# Patient Record
Sex: Female | Born: 1940 | Race: White | Hispanic: No | Smoking: Never smoker
Health system: Southern US, Community
[De-identification: ages and names within clinical notes are randomized; demographics above are authoritative.]

## PROBLEM LIST (undated history)

## (undated) DIAGNOSIS — M069 Rheumatoid arthritis, unspecified: Secondary | ICD-10-CM

## (undated) DIAGNOSIS — M8580 Other specified disorders of bone density and structure, unspecified site: Secondary | ICD-10-CM

## (undated) DIAGNOSIS — I272 Pulmonary hypertension, unspecified: Secondary | ICD-10-CM

## (undated) DIAGNOSIS — K219 Gastro-esophageal reflux disease without esophagitis: Secondary | ICD-10-CM

## (undated) DIAGNOSIS — C4490 Unspecified malignant neoplasm of skin, unspecified: Secondary | ICD-10-CM

## (undated) DIAGNOSIS — H6691 Otitis media, unspecified, right ear: Secondary | ICD-10-CM

## (undated) DIAGNOSIS — R9431 Abnormal electrocardiogram [ECG] [EKG]: Secondary | ICD-10-CM

## (undated) DIAGNOSIS — I10 Essential (primary) hypertension: Secondary | ICD-10-CM

## (undated) DIAGNOSIS — R040 Epistaxis: Secondary | ICD-10-CM

## (undated) DIAGNOSIS — J309 Allergic rhinitis, unspecified: Secondary | ICD-10-CM

## (undated) DIAGNOSIS — J189 Pneumonia, unspecified organism: Secondary | ICD-10-CM

## (undated) DIAGNOSIS — R011 Cardiac murmur, unspecified: Secondary | ICD-10-CM

## (undated) DIAGNOSIS — I482 Chronic atrial fibrillation, unspecified: Secondary | ICD-10-CM

## (undated) DIAGNOSIS — L719 Rosacea, unspecified: Secondary | ICD-10-CM

## (undated) DIAGNOSIS — J9690 Respiratory failure, unspecified, unspecified whether with hypoxia or hypercapnia: Secondary | ICD-10-CM

## (undated) DIAGNOSIS — Z9289 Personal history of other medical treatment: Secondary | ICD-10-CM

## (undated) DIAGNOSIS — J42 Unspecified chronic bronchitis: Secondary | ICD-10-CM

## (undated) DIAGNOSIS — E7800 Pure hypercholesterolemia, unspecified: Secondary | ICD-10-CM

## (undated) DIAGNOSIS — L564 Polymorphous light eruption: Secondary | ICD-10-CM

## (undated) DIAGNOSIS — J918 Pleural effusion in other conditions classified elsewhere: Secondary | ICD-10-CM

## (undated) DIAGNOSIS — M7502 Adhesive capsulitis of left shoulder: Secondary | ICD-10-CM

## (undated) DIAGNOSIS — E785 Hyperlipidemia, unspecified: Secondary | ICD-10-CM

## (undated) HISTORY — DX: Hyperlipidemia, unspecified: E78.5

## (undated) HISTORY — DX: Allergic rhinitis, unspecified: J30.9

## (undated) HISTORY — DX: Gastro-esophageal reflux disease without esophagitis: K21.9

## (undated) HISTORY — DX: Pulmonary hypertension, unspecified: I27.20

## (undated) HISTORY — PX: TEMPOROMANDIBULAR JOINT SURGERY: SHX35

## (undated) HISTORY — DX: Pleural effusion in other conditions classified elsewhere: J91.8

## (undated) HISTORY — DX: Essential (primary) hypertension: I10

## (undated) HISTORY — DX: Chronic atrial fibrillation, unspecified: I48.20

## (undated) HISTORY — DX: Adhesive capsulitis of left shoulder: M75.02

## (undated) HISTORY — DX: Pneumonia, unspecified organism: J18.9

## (undated) HISTORY — PX: BREAST BIOPSY: SHX20

## (undated) HISTORY — DX: Polymorphous light eruption: L56.4

## (undated) HISTORY — DX: Pure hypercholesterolemia, unspecified: E78.00

## (undated) HISTORY — DX: Other specified disorders of bone density and structure, unspecified site: M85.80

## (undated) HISTORY — DX: Personal history of other medical treatment: Z92.89

## (undated) HISTORY — PX: TOTAL ABDOMINAL HYSTERECTOMY: SHX209

## (undated) HISTORY — DX: Rosacea, unspecified: L71.9

## (undated) HISTORY — DX: Rheumatoid arthritis, unspecified: M06.9

## (undated) HISTORY — DX: Otitis media, unspecified, right ear: H66.91

---

## 1973-01-16 DIAGNOSIS — M069 Rheumatoid arthritis, unspecified: Secondary | ICD-10-CM

## 1973-01-16 HISTORY — DX: Rheumatoid arthritis, unspecified: M06.9

## 1978-01-16 DIAGNOSIS — M7502 Adhesive capsulitis of left shoulder: Secondary | ICD-10-CM

## 1978-01-16 HISTORY — DX: Adhesive capsulitis of left shoulder: M75.02

## 1984-01-17 DIAGNOSIS — H6691 Otitis media, unspecified, right ear: Secondary | ICD-10-CM

## 1984-01-17 HISTORY — DX: Otitis media, unspecified, right ear: H66.91

## 1999-08-05 ENCOUNTER — Encounter: Admission: RE | Admit: 1999-08-05 | Discharge: 1999-08-05 | Payer: Self-pay | Admitting: Internal Medicine

## 1999-08-05 ENCOUNTER — Encounter: Payer: Self-pay | Admitting: Internal Medicine

## 2000-02-27 ENCOUNTER — Inpatient Hospital Stay (HOSPITAL_COMMUNITY): Admission: EM | Admit: 2000-02-27 | Discharge: 2000-02-29 | Payer: Self-pay | Admitting: Emergency Medicine

## 2000-02-27 ENCOUNTER — Encounter: Payer: Self-pay | Admitting: Rheumatology

## 2000-02-28 ENCOUNTER — Encounter: Payer: Self-pay | Admitting: Internal Medicine

## 2000-02-28 ENCOUNTER — Encounter: Payer: Self-pay | Admitting: Rheumatology

## 2000-03-29 ENCOUNTER — Ambulatory Visit (HOSPITAL_COMMUNITY): Admission: RE | Admit: 2000-03-29 | Discharge: 2000-03-29 | Payer: Self-pay | Admitting: Neurosurgery

## 2000-03-29 ENCOUNTER — Encounter: Payer: Self-pay | Admitting: Neurosurgery

## 2000-04-12 ENCOUNTER — Ambulatory Visit (HOSPITAL_COMMUNITY): Admission: RE | Admit: 2000-04-12 | Discharge: 2000-04-12 | Payer: Self-pay | Admitting: Neurosurgery

## 2000-04-12 ENCOUNTER — Encounter: Payer: Self-pay | Admitting: Neurosurgery

## 2000-05-02 ENCOUNTER — Encounter: Payer: Self-pay | Admitting: Neurosurgery

## 2000-05-02 ENCOUNTER — Encounter: Admission: RE | Admit: 2000-05-02 | Discharge: 2000-05-02 | Payer: Self-pay | Admitting: Neurosurgery

## 2000-10-23 ENCOUNTER — Other Ambulatory Visit: Admission: RE | Admit: 2000-10-23 | Discharge: 2000-10-23 | Payer: Self-pay | Admitting: Dermatology

## 2001-10-28 ENCOUNTER — Other Ambulatory Visit: Admission: RE | Admit: 2001-10-28 | Discharge: 2001-10-28 | Payer: Self-pay | Admitting: Gynecology

## 2002-01-16 DIAGNOSIS — L564 Polymorphous light eruption: Secondary | ICD-10-CM

## 2002-01-16 HISTORY — DX: Polymorphous light eruption: L56.4

## 2002-11-11 ENCOUNTER — Other Ambulatory Visit: Admission: RE | Admit: 2002-11-11 | Discharge: 2002-11-11 | Payer: Self-pay | Admitting: Gynecology

## 2003-11-16 ENCOUNTER — Other Ambulatory Visit: Admission: RE | Admit: 2003-11-16 | Discharge: 2003-11-16 | Payer: Self-pay | Admitting: Gynecology

## 2004-10-31 ENCOUNTER — Other Ambulatory Visit: Admission: RE | Admit: 2004-10-31 | Discharge: 2004-10-31 | Payer: Self-pay | Admitting: Gynecology

## 2005-11-13 ENCOUNTER — Other Ambulatory Visit: Admission: RE | Admit: 2005-11-13 | Discharge: 2005-11-13 | Payer: Self-pay | Admitting: Gynecology

## 2006-11-19 ENCOUNTER — Other Ambulatory Visit: Admission: RE | Admit: 2006-11-19 | Discharge: 2006-11-19 | Payer: Self-pay | Admitting: Gynecology

## 2007-02-04 ENCOUNTER — Encounter: Admission: RE | Admit: 2007-02-04 | Discharge: 2007-02-04 | Payer: Self-pay | Admitting: Geriatric Medicine

## 2010-03-17 HISTORY — PX: CARDIAC CATHETERIZATION: SHX172

## 2010-04-04 ENCOUNTER — Inpatient Hospital Stay (HOSPITAL_COMMUNITY)
Admission: EM | Admit: 2010-04-04 | Discharge: 2010-04-12 | DRG: 287 | Disposition: A | Discharge status: Home / Self Care | Payer: Medicare Other | Source: Ambulatory Visit | Attending: Interventional Cardiology | Admitting: Interventional Cardiology

## 2010-04-04 ENCOUNTER — Emergency Department (HOSPITAL_COMMUNITY): Payer: Medicare Other

## 2010-04-04 DIAGNOSIS — E785 Hyperlipidemia, unspecified: Secondary | ICD-10-CM | POA: Diagnosis present

## 2010-04-04 DIAGNOSIS — G43909 Migraine, unspecified, not intractable, without status migrainosus: Secondary | ICD-10-CM | POA: Diagnosis present

## 2010-04-04 DIAGNOSIS — M069 Rheumatoid arthritis, unspecified: Secondary | ICD-10-CM | POA: Diagnosis present

## 2010-04-04 DIAGNOSIS — Z79899 Other long term (current) drug therapy: Secondary | ICD-10-CM

## 2010-04-04 DIAGNOSIS — IMO0002 Reserved for concepts with insufficient information to code with codable children: Secondary | ICD-10-CM

## 2010-04-04 DIAGNOSIS — I4581 Long QT syndrome: Secondary | ICD-10-CM | POA: Diagnosis present

## 2010-04-04 DIAGNOSIS — K219 Gastro-esophageal reflux disease without esophagitis: Secondary | ICD-10-CM | POA: Diagnosis present

## 2010-04-04 DIAGNOSIS — J90 Pleural effusion, not elsewhere classified: Secondary | ICD-10-CM | POA: Diagnosis present

## 2010-04-04 DIAGNOSIS — I4891 Unspecified atrial fibrillation: Principal | ICD-10-CM | POA: Diagnosis present

## 2010-04-04 LAB — COMPREHENSIVE METABOLIC PANEL
ALT: 32 U/L (ref 0–35)
Alkaline Phosphatase: 69 U/L (ref 39–117)
BUN: 9 mg/dL (ref 6–23)
BUN: 6 mg/dL (ref 6–23)
CO2: 23 mEq/L (ref 19–32)
Calcium: 8.2 mg/dL — ABNORMAL LOW (ref 8.4–10.5)
Creatinine, Ser: 0.74 mg/dL (ref 0.4–1.2)
Creatinine, Ser: 0.64 mg/dL (ref 0.4–1.2)
GFR calc non Af Amer: >60 mL/min (ref >60)
Glucose, Bld: 139 mg/dL — ABNORMAL HIGH (ref 70–99)
Glucose, Bld: 117 mg/dL — ABNORMAL HIGH (ref 70–99)
Potassium: 3.6 mEq/L (ref 3.5–5.1)
Sodium: 136 mEq/L (ref 135–145)
Total Bilirubin: 0.8 mg/dL (ref 0.3–1.2)
Total Protein: 6.8 g/dL (ref 6.0–8.3)

## 2010-04-04 LAB — DIFFERENTIAL
Basophils Absolute: 0 10*3/uL (ref 0.0–0.1)
Eosinophils Absolute: 0.1 10*3/uL (ref 0.0–0.7)
Eosinophils Relative: 1 % (ref 0–5)
Eosinophils Relative: 0 % (ref 0–5)
Lymphocytes Relative: 14 % (ref 12–46)
Lymphocytes Relative: 14 % (ref 12–46)
Lymphs Abs: 1.4 10*3/uL (ref 0.7–4.0)
Monocytes Absolute: 1.6 10*3/uL — ABNORMAL HIGH (ref 0.1–1.0)
Monocytes Relative: 17 % — ABNORMAL HIGH (ref 3–12)
Neutrophils Relative %: 70 % (ref 43–77)

## 2010-04-04 LAB — CBC
HCT: 41.4 % (ref 36.0–46.0)
MCH: 30.3 pg (ref 26.0–34.0)
MCHC: 34.1 g/dL (ref 30.0–36.0)
MCV: 88.8 fL (ref 78.0–100.0)
Platelets: 236 10*3/uL (ref 150–400)
RBC: 5.03 MIL/uL (ref 3.87–5.11)
RDW: 15.1 % (ref 11.5–15.5)
RDW: 16 % — ABNORMAL HIGH (ref 11.5–15.5)
WBC: 10.6 10*3/uL — ABNORMAL HIGH (ref 4.0–10.5)

## 2010-04-04 LAB — POCT CARDIAC MARKERS
CKMB, poc: <1 ng/mL — ABNORMAL LOW (ref 1.0–8.0)
Myoglobin, poc: 68 ng/mL (ref 12–200)
Troponin i, poc: <0.05 ng/mL (ref 0.00–0.09)

## 2010-04-04 LAB — CARDIAC PANEL(CRET KIN+CKTOT+MB+TROPI)

## 2010-04-04 LAB — LIPASE, BLOOD: Lipase: 21 U/L (ref 11–59)

## 2010-04-04 LAB — CK TOTAL AND CKMB (NOT AT ARMC): CK, MB: 1.1 ng/mL (ref 0.3–4.0)

## 2010-04-05 LAB — LIPID PANEL
HDL: 38 mg/dL — ABNORMAL LOW (ref >39)
Total CHOL/HDL Ratio: 3.1 RATIO
Triglycerides: 66 mg/dL (ref <150)
VLDL: 13 mg/dL (ref 0–40)

## 2010-04-05 LAB — URINALYSIS, ROUTINE W REFLEX MICROSCOPIC
Bilirubin Urine: NEGATIVE
Protein, ur: NEGATIVE mg/dL
Urobilinogen, UA: 0.2 mg/dL (ref 0.0–1.0)

## 2010-04-05 LAB — BASIC METABOLIC PANEL
CO2: 24 mEq/L (ref 19–32)
Chloride: 105 mEq/L (ref 96–112)
Sodium: 135 mEq/L (ref 135–145)

## 2010-04-05 LAB — HEPARIN LEVEL (UNFRACTIONATED): Heparin Unfractionated: 0.29 IU/mL — ABNORMAL LOW (ref 0.30–0.70)

## 2010-04-05 LAB — CBC
Hemoglobin: 12 g/dL (ref 12.0–15.0)
MCH: 30.5 pg (ref 26.0–34.0)
RBC: 3.94 MIL/uL (ref 3.87–5.11)

## 2010-04-05 LAB — URINE MICROSCOPIC-ADD ON

## 2010-04-05 LAB — CARDIAC PANEL(CRET KIN+CKTOT+MB+TROPI)

## 2010-04-05 NOTE — H&P (Signed)
[name], [name]             ACCOUNT NO.:  [id number]  MEDICAL RECORD NO.:  [id number]           PATIENT TYPE:  I  LOCATION:  2807                         FACILITY:  [facility]  PHYSICIAN:  Armanda Magic, M.D.     DATE OF BIRTH:  1940-05-21  DATE OF ADMISSION:  04/04/2010 DATE OF DISCHARGE:                             HISTORY & PHYSICAL   REFERRING PHYSICIAN:  Nelva Nay, MD, in the emergency room.  CHIEF COMPLAINTS:  Back pain and neck pain.  HISTORY OF PRESENT ILLNESS:  This is a 70 year old white female with a history of rheumatoid arthritis, long QT syndrome, and GERD, who has no other cardiac history, who presented to the emergency room.  She complains of back pain between her shoulder blades and pain in her throat.  Apparently, she has had a stomach flu over the past several days.  She had nausea, vomiting, and diarrhea on Thursday which stopped on Friday.  Saturday, she developed back pain between her shoulderblades radiating to her neck and shortness of breath.  She refuses at times to take medical advice.  Her symptoms persisted over the weekend. She was awakened last evening with severe pain between her shoulder blades and throat pain and presented to the emergency room with rapid AFib..  There was no ST elevation noted.  While in rapid atrial fibrillation once she was slowed down with IV Cardizem, it was noted that EKG showed 1-mm ST elevation in I and aVL.  She continues to complain of back and throat pain with shortness of breath.  PAST MEDICAL HISTORY:  GERD, rheumatoid arthritis, dyslipidemia, adhesive capsulitis, migraine headaches, allergic rhinitis, acne rosacea, bilateral otitis, history of UTI, and prolonged QTC.  PAST SURGICAL HISTORY:  Total abdominal hysterectomy with BSO, multiple benign moles removed from her left breast, and breast biopsy.  FAMILY HISTORY:  Her father died at 66 of a CVA and CHF.  Her mother died at 69 of CHF.  She has a sister who  had a mitral valve repair.  She has a sister with AFib and lung CA.  She has a sister who is alive and well.  SOCIAL HISTORY:  She is retired.  She denies any alcohol or IV drug use. She is married.  She denies any tobacco use.  REVIEW OF SYSTEMS:  Otherwise as stated in the HPI is negative.  MEDICATIONS: 1. Vitamin D 2000 units daily. 2. Calcium plus D 1000 mg daily. 3. Multivitamin. 4. Toprol-XL 100 mg daily. 5. Enbrel 50 mg/mL 0.5 mL subcu every week. 6. Prednisone 5 mg 1/2 tablet every other day. 7. Methotrexate 2.5 mg 2 tablets on Tuesday and 2 tablets on Wednesday     weekly. 8. Vivelle-Dot 0.075 mg/24 hour patch biweekly. 9. Omeprazole 20 mg daily. 10.Pravastatin 20 mg daily. 11.Folic acid 1 mg daily.  PHYSICAL EXAMINATION:  VITAL SIGNS:  Blood pressure is 104/56.  Heart rate is 87. GENERAL:  This is a well-developed and well-nourished female, in mild distress secondary to back and throat pain. HEENT:  Benign. NECK:  Supple without lymphadenopathy.  Carotid upstrokes are +2 bilaterally.  No bruits. LUNGS:  Clear to  auscultation throughout. HEART:  Irregularly irregular.  No murmurs, rubs, or gallops.  Normal S1 and S2. ABDOMEN:  Soft, nontender, and nondistended.  Normoactive bowel sounds. No hepatosplenomegaly. EXTREMITIES:  No cyanosis, erythema, or edema.  LABORATORY DATA:  CPK 29, MB 1.1, and troponin 0.01.  White cell count 10.6, hemoglobin 15, hematocrit 44, and platelet count 236.  Sodium 136, potassium 3.6, chloride 105, bicarb 23, BUN 9, creatinine 0.74, and glucose 139.  ALT 36, AST 34, and lipase 21.  Chest x-ray shows no active disease.  EKG shows AFib with RVR with ST elevation 1 mm in aVL and lead I.  ASSESSMENT: 1. Neck and back pain with ST-segment elevation in I and aVL with     shortness of breath and normal cardiac enzymes x1. 2. Prolonged QTc. 3. New-onset atrial fibrillation with rapid ventricular response     apparently starting last  evening. 4. Rheumatoid arthritis, on steroids. 5. Gastroesophageal reflux disease.  PLAN:  Emergent cath per Dr. Eldridge Dace.  We will start IV heparin drip per Pharmacy.  Lopressor as blood pressure tolerates.  Further workup per Dr. Eldridge Dace.     Armanda Magic, M.D.     TT/MEDQ  D:  04/04/2010  T:  04/04/2010  Job:  [id number]  cc:   Thora Lance, M.D.  Electronically Signed by Armanda Magic M.D. on 04/05/2010 11:32:38 AM

## 2010-04-06 ENCOUNTER — Inpatient Hospital Stay (HOSPITAL_COMMUNITY): Payer: Medicare Other

## 2010-04-06 LAB — C-REACTIVE PROTEIN: CRP: 28.7 mg/dL — ABNORMAL HIGH (ref <0.6)

## 2010-04-06 LAB — HEPARIN LEVEL (UNFRACTIONATED): Heparin Unfractionated: 0.23 IU/mL — ABNORMAL LOW (ref 0.30–0.70)

## 2010-04-06 LAB — COMPREHENSIVE METABOLIC PANEL
Albumin: 2.7 g/dL — ABNORMAL LOW (ref 3.5–5.2)
BUN: 6 mg/dL (ref 6–23)
Calcium: 8.2 mg/dL — ABNORMAL LOW (ref 8.4–10.5)
Glucose, Bld: 107 mg/dL — ABNORMAL HIGH (ref 70–99)
Total Protein: 5.7 g/dL — ABNORMAL LOW (ref 6.0–8.3)

## 2010-04-06 LAB — LACTIC ACID, PLASMA: Lactic Acid, Venous: 1.2 mmol/L (ref 0.5–2.2)

## 2010-04-06 LAB — CBC
HCT: 32.8 % — ABNORMAL LOW (ref 36.0–46.0)
MCHC: 32.9 g/dL (ref 30.0–36.0)
MCV: 89.9 fL (ref 78.0–100.0)
Platelets: 180 10*3/uL (ref 150–400)
RDW: 15.3 % (ref 11.5–15.5)

## 2010-04-06 LAB — SEDIMENTATION RATE: Sed Rate: 63 mm/hr — ABNORMAL HIGH (ref 0–22)

## 2010-04-06 LAB — RHEUMATOID FACTOR: Rhuematoid fact SerPl-aCnc: 172 IU/mL — ABNORMAL HIGH (ref <=14)

## 2010-04-06 LAB — LACTATE DEHYDROGENASE: LDH: 189 U/L (ref 94–250)

## 2010-04-06 LAB — PROCALCITONIN: Procalcitonin: 0.5 ng/mL

## 2010-04-06 LAB — LIPASE, BLOOD: Lipase: 23 U/L (ref 11–59)

## 2010-04-06 MED ORDER — GADOBENATE DIMEGLUMINE 529 MG/ML IV SOLN
15.0000 mL | Freq: Once | INTRAVENOUS | Status: AC
  Administered 2010-04-06: 15 mL via INTRAVENOUS

## 2010-04-06 MED ORDER — IOHEXOL 350 MG/ML SOLN
100.0000 mL | Freq: Once | INTRAVENOUS | Status: AC | PRN
  Administered 2010-04-06: 100 mL via INTRAVENOUS

## 2010-04-07 LAB — CBC
HCT: 33.1 % — ABNORMAL LOW (ref 36.0–46.0)
MCV: 88.3 fL (ref 78.0–100.0)
RBC: 3.75 MIL/uL — ABNORMAL LOW (ref 3.87–5.11)
RDW: 15.2 % (ref 11.5–15.5)
WBC: 6.6 10*3/uL (ref 4.0–10.5)

## 2010-04-07 LAB — DIFFERENTIAL
Basophils Relative: 0 % (ref 0–1)
Eosinophils Relative: 2 % (ref 0–5)
Monocytes Absolute: 0.9 10*3/uL (ref 0.1–1.0)
Neutrophils Relative %: 60 % (ref 43–77)

## 2010-04-07 LAB — BASIC METABOLIC PANEL
BUN: 7 mg/dL (ref 6–23)
CO2: 28 mEq/L (ref 19–32)
Chloride: 105 mEq/L (ref 96–112)
Glucose, Bld: 99 mg/dL (ref 70–99)
Potassium: 3.5 mEq/L (ref 3.5–5.1)

## 2010-04-07 LAB — URINALYSIS, ROUTINE W REFLEX MICROSCOPIC
Bilirubin Urine: NEGATIVE
Glucose, UA: NEGATIVE mg/dL
Ketones, ur: NEGATIVE mg/dL
pH: 7 (ref 5.0–8.0)

## 2010-04-07 LAB — URINE MICROSCOPIC-ADD ON

## 2010-04-08 LAB — CBC
Hemoglobin: 12.7 g/dL (ref 12.0–15.0)
MCHC: 32.9 g/dL (ref 30.0–36.0)
Platelets: 321 10*3/uL (ref 150–400)
RBC: 4.27 MIL/uL (ref 3.87–5.11)

## 2010-04-08 LAB — URINE CULTURE: Culture  Setup Time: 201203201134

## 2010-04-08 LAB — BASIC METABOLIC PANEL
Chloride: 107 mEq/L (ref 96–112)
Creatinine, Ser: 0.68 mg/dL (ref 0.4–1.2)
GFR calc Af Amer: >60 mL/min (ref >60)
GFR calc non Af Amer: >60 mL/min (ref >60)
Potassium: 4.3 mEq/L (ref 3.5–5.1)

## 2010-04-08 LAB — LEGIONELLA ANTIGEN, URINE

## 2010-04-08 LAB — HEPARIN LEVEL (UNFRACTIONATED): Heparin Unfractionated: 0.61 IU/mL (ref 0.30–0.70)

## 2010-04-08 LAB — HEPATITIS PANEL, ACUTE: Hep A IgM: NEGATIVE

## 2010-04-08 LAB — CARDIOLIPIN ANTIBODIES, IGG, IGM, IGA: Anticardiolipin IgM: 7 MPL U/mL — ABNORMAL LOW (ref <11)

## 2010-04-08 LAB — BRAIN NATRIURETIC PEPTIDE: Pro B Natriuretic peptide (BNP): 341 pg/mL — ABNORMAL HIGH (ref 0.0–100.0)

## 2010-04-09 LAB — CBC
MCHC: 33.4 g/dL (ref 30.0–36.0)
Platelets: 357 10*3/uL (ref 150–400)
RDW: 15.5 % (ref 11.5–15.5)
WBC: 7.9 10*3/uL (ref 4.0–10.5)

## 2010-04-09 LAB — URINE CULTURE
Colony Count: 25000
Culture  Setup Time: 201203222255

## 2010-04-09 LAB — PROTIME-INR
INR: 1.15 (ref 0.00–1.49)
Prothrombin Time: 14.9 seconds (ref 11.6–15.2)

## 2010-04-09 LAB — HEPARIN LEVEL (UNFRACTIONATED): Heparin Unfractionated: 0.63 IU/mL (ref 0.30–0.70)

## 2010-04-10 LAB — BASIC METABOLIC PANEL
BUN: 8 mg/dL (ref 6–23)
Calcium: 8.7 mg/dL (ref 8.4–10.5)
GFR calc non Af Amer: >60 mL/min (ref >60)
Glucose, Bld: 97 mg/dL (ref 70–99)
Potassium: 3.9 mEq/L (ref 3.5–5.1)
Sodium: 138 mEq/L (ref 135–145)

## 2010-04-10 LAB — CBC
HCT: 36 % (ref 36.0–46.0)
MCHC: 33.1 g/dL (ref 30.0–36.0)
Platelets: 351 10*3/uL (ref 150–400)
RDW: 15.4 % (ref 11.5–15.5)
WBC: 10.5 10*3/uL (ref 4.0–10.5)

## 2010-04-10 LAB — PROTIME-INR: INR: 1.44 (ref 0.00–1.49)

## 2010-04-10 LAB — HEPARIN LEVEL (UNFRACTIONATED): Heparin Unfractionated: 0.6 IU/mL (ref 0.30–0.70)

## 2010-04-11 LAB — CULTURE, BLOOD (ROUTINE X 2): Culture  Setup Time: 201203201617

## 2010-04-11 LAB — UIFE/LIGHT CHAINS/TP QN, 24-HR UR
Alpha 1, Urine: DETECTED — AB
Free Kappa Lt Chains,Ur: 2.84 mg/dL — ABNORMAL HIGH (ref 0.04–1.51)
Free Lambda Lt Chains,Ur: 0.33 mg/dL (ref 0.08–1.01)
Gamma Globulin, Urine: DETECTED — AB

## 2010-04-11 LAB — PROTEIN ELECTROPH W RFLX QUANT IMMUNOGLOBULINS
Albumin ELP: 48.4 % — ABNORMAL LOW (ref 55.8–66.1)
Alpha-2-Globulin: 14.8 % — ABNORMAL HIGH (ref 7.1–11.8)
Beta Globulin: 5.4 % (ref 4.7–7.2)
Total Protein ELP: 5.9 g/dL — ABNORMAL LOW (ref 6.0–8.3)

## 2010-04-11 LAB — CBC
HCT: 38.1 % (ref 36.0–46.0)
MCHC: 32.5 g/dL (ref 30.0–36.0)
MCV: 89.9 fL (ref 78.0–100.0)
Platelets: 378 10*3/uL (ref 150–400)
RDW: 15.5 % (ref 11.5–15.5)
WBC: 9.6 10*3/uL (ref 4.0–10.5)

## 2010-04-12 LAB — CBC
HCT: 38.7 % (ref 36.0–46.0)
MCV: 89.4 fL (ref 78.0–100.0)
Platelets: 359 10*3/uL (ref 150–400)
RBC: 4.33 MIL/uL (ref 3.87–5.11)
WBC: 9.1 10*3/uL (ref 4.0–10.5)

## 2010-04-12 LAB — PROTIME-INR: INR: 2.09 — ABNORMAL HIGH (ref 0.00–1.49)

## 2010-04-17 HISTORY — PX: CARDIOVERSION: SHX1299

## 2010-04-20 NOTE — Discharge Summary (Signed)
  [name], [name]             ACCOUNT NO.:  [id number]  MEDICAL RECORD NO.:  [id number]           PATIENT TYPE:  I  LOCATION:  3709                         FACILITY:  [facility]  PHYSICIAN:  Corky Crafts, MDDATE OF BIRTH:  Oct 21, 1940  DATE OF ADMISSION:  04/04/2010 DATE OF DISCHARGE:  04/12/2010                              DISCHARGE SUMMARY   FINAL DIAGNOSES: 1. Pleural effusion with shortness of breath possibly secondary to     rheumatoid arthritis. 2. Atrial fibrillation. 3. Abnormal ECG causing workup including cardiac catheterization,     which was normal.  PROCEDURES: 1. Cardiac catheterization on April 04, 2010. 2. Echocardiogram on April 08, 2010.  HOSPITAL COURSE:  The patient was admitted due to significant back pain and shortness of breath.  Initial ECG showed question of lateral wall ST elevation.  She was taken emergently to the Cath Lab.  She underwent cardiac catheterization, which showed no significant coronary artery disease.  There was normal left ventricular function.  There was no abdominal aortic aneurysm.  The patient was found to be in atrial fibrillation with rapid ventricular response.  She was rate controlled initially with Cardizem and metoprolol.  Digoxin was added.  There was some difficulty in rate controlling her because of borderline blood pressure.  Eventually, she did tolerate oral Cardizem, metoprolol and low-dose digoxin.  The main issue during her hospital stay was her shortness of breath. She was found to have bilateral pleural effusions.  Her shortness of breath and the effusion seem to respond to prednisone.  She also had a low-grade fever, which was thought to be related to her rheumatoid arthritis.  No definitive infection was found.  She also had back and neck pain causing a MRI to be done with no source of infection was seen there.  There were some arthritic changes noted.  She was started on Coumadin for anticoagulation,  not have any bleeding problems. Echocardiogram showed no significant pericardial effusions.  DISCHARGE MEDICATIONS: 1. Tylenol p.r.n. 2. Aspirin 81 mg daily. 3. Digoxin 0.125 mg daily. 4. Diltiazem 240 mg daily. 5. Prednisone 10 mg daily. 6. Warfarin 2.5 mg daily. 7. Calcium. 8. Enbrel. 9. Folic acid. 10.Methotrexate 2.5 mg Tuesday and Wednesday. 11.Multivitamin. 12.Omeprazole 20 mg daily. 13.Pravastatin 20 mg daily. 14.Stool softener. 15.Toprol-XL 100 mg daily. 16.Vitamin B3. 17.Vivelle-Dot 0.75 one patch Sundays and Wednesdays.  LABS AT DISCHARGE:  INR 2.09.  DIET:  Heart-healthy and low-salt diet.  Followup appointments with Dr. Valentina Lucks in 1 week, follow up with Dr. Hoyle Barr office for an INR checked on April 14, 2010, and also follow up with Dr. Ardelle Park as scheduled.  ACTIVITY:  Increase activity slowly.     Corky Crafts, MD     JSV/MEDQ  D:  04/12/2010  T:  04/12/2010  Job:  [id number]  Electronically Signed by Lance Muss MD on 04/20/2010 08:21:51 AM

## 2010-04-20 NOTE — Procedures (Signed)
  [name], [name]             ACCOUNT NO.:  [id number]  MEDICAL RECORD NO.:  [id number]           PATIENT TYPE:  I  LOCATION:  2923                         FACILITY:  [facility]  PHYSICIAN:  Corky Crafts, MDDATE OF BIRTH:  05-06-1940  DATE OF PROCEDURE:  04/04/2010 DATE OF DISCHARGE:                           CARDIAC CATHETERIZATION   PRIMARY CARE PHYSICIAN:  Thora Lance, MD  PROCEDURE PERFORMED:  Left heart catheterization, left ventriculogram, coronary angiogram, abdominal aortogram.  OPERATOR:  Corky Crafts, MD  INDICATIONS:  Abnormal ECG suspected lateral wall ST-elevation MI.  PROCEDURE:  The patient was brought emergently to the cath lab.  She was prepped and draped in the usual sterile fashion.  Her right groin was infiltrated with 1% lidocaine.  A 6-French sheath was placed in the right common femoral artery using a modified Seldinger technique.  Left coronary artery angiography was performed using a JL-4.0 catheter. Catheter was advanced to the vessel ostium under fluoroscopic guidance. Digital angiography was performed in multiple projections using hand injection of contrast.  Right coronary artery angiography was then performed with a JR-4 catheter in similar fashion.  A pigtail catheter was advanced to the ascending aorta and across the aortic valve under fluoroscopic guidance.  Power injection of contrast was performed in the RAO projection to image the left ventricle.  The catheter was pulled back under continuous hemodynamic pressure monitoring.  The catheter was then withdrawn to the abdominal aorta and a power injection of contrast was performed to image the abdominal aorta.  The sheath will be removed using manual compression.  FINDINGS: 1. The left main is widely patent. 2. Left circumflex is a large vessel.  There is a large OM-1 which is     tortuous and an OM-2 which is medium sized.  All of these vessels     are angiographically  normal. 3. Left anterior descending is a large vessel which wraps around the     apex.  There are two small diagonals, all of which are     angiographically normal. 4. The right coronary artery is medium-sized vessel.  There are mild     irregularities in the mid vessel.  The posterolateral artery and     the PDA are medium-sized vessels and appear widely patent. 5. Left ventriculogram shows normal left ventricular function.  The     ascending aorta appears normal.  HEMODYNAMIC RESULTS:  Ventricle pressure 96/4 with an LVEDP of 10 mmHg. Aortic pressure 100/63 with a mean aortic pressure of 79 mmHg. Abdominal aortogram showed no abdominal aortic aneurysm.  There are only mild irregularities noted in the aorta.  IMPRESSION: 1. No significant coronary artery disease. 2. Normal ventricular function. 3. No abdominal aortic aneurysm. 4. Normal hemodynamics.  RECOMMENDATIONS:  Continue aspirin.  She will need management for atrial fibrillation.  She will likely need to be started on heparin several hours after her sheath pull.     Corky Crafts, MD     JSV/MEDQ  D:  04/04/2010  T:  04/05/2010  Job:  [id number]  Electronically Signed by Lance Muss MD on 04/20/2010 08:21:54 AM

## 2010-05-04 ENCOUNTER — Ambulatory Visit (HOSPITAL_COMMUNITY)
Admission: RE | Admit: 2010-05-04 | Discharge: 2010-05-04 | Disposition: A | Discharge status: Home / Self Care | Payer: Medicare Other | Source: Ambulatory Visit | Attending: Interventional Cardiology | Admitting: Interventional Cardiology

## 2010-05-04 DIAGNOSIS — I4891 Unspecified atrial fibrillation: Secondary | ICD-10-CM | POA: Insufficient documentation

## 2010-05-04 DIAGNOSIS — Z7901 Long term (current) use of anticoagulants: Secondary | ICD-10-CM | POA: Insufficient documentation

## 2010-05-04 LAB — PROTIME-INR: Prothrombin Time: 22.9 seconds — ABNORMAL HIGH (ref 11.6–15.2)

## 2010-05-13 NOTE — Consult Note (Signed)
[name], [name]             ACCOUNT NO.:  [id number]  MEDICAL RECORD NO.:  [id number]           PATIENT TYPE:  I  LOCATION:  2923                         FACILITY:  [facility]  PHYSICIAN:  Pincus Large, MD     DATE OF BIRTH:  [birth year]  DATE OF CONSULTATION:  04/05/2010 DATE OF DISCHARGE:                                CONSULTATION   REQUESTING PHYSICIAN:  Gretta Arab. Valentina Lucks, MD  REASON FOR CONSULTATION:  Fever.  BRIEF HOSPITAL COURSE:  She is a 70 year old white female with history of rheumatoid arthritis, history of long QT syndrome, and history of GERD who has no other cardiac history presented to the emergency room on the day of admission with neck pain between her shoulder blades and pain in her throat.  Apparently, the patient was having vomiting over the past several days then she had nausea, vomiting, and diarrhea which resolved on Friday.  Saturday, she woke up with back pain between her shoulder blades radiating to her neck.  The patient came to the emergency room where we found that she has a new onset AFib.  When we gave IV Cardizem, it was noted that the patient has ST elevation on II and aVL.  The patient was taken to cardiac cath which was normal. Medical consult was called for fever of unknown etiology.  I saw the patient this afternoon.  The patient was having severe back pain in her thoracic spine with shortness of breath.  She denies any cough.  She denies any nausea.  She denies any vomiting or diarrhea.  She denies any dysuria.  IMAGING:  Her chest x-ray was done on April 04, 2010 which was normal.  LABORATORY DATA:  WBC on admission was 10.6 which dropped to 8.1 on April 05, 2010.  Her sodium is 136, potassium is 3.4, chloride is 105, bicarb is 24, glucose 119, BUN is 6, and creatinine is 0.6.  Her urine shows LE with moderate, wbc's 3-6, and bacteria was rare.  Culture was pending.  PAST MEDICAL HISTORY: 1. GERD. 2. Rheumatoid arthritis. 3.  Dyslipidemia. 4. Migraine headaches. 5. Allergic rhinitis. 6. History of UTI. 7. History of prolonged QT interval.  PAST SURGICAL HISTORY:  Total abdominal hysterectomy and multiple benign moles removed from her left breast.  FAMILY HISTORY:  Father died at 18 from CVA and CHF.  Mother died at 75 of CHF.  SOCIAL HISTORY:  She is retired.  She denies any alcohol or IV drug abuse.  ALLERGIES:  The patient is allergic to: 1. PENICILLIN. 2. NAPROXEN. 3. NITROFURANTOIN. 4. ERYTHROMYCIN.  REVIEW OF SYSTEMS:  All systems were reviewed including HEENT, GI, GU, CVS, CNS, allergy/immunology, endocrine, and musculoskeletal and all were negative except for those mentioned in H and P.  PHYSICAL EXAMINATION:  GENERAL:  The patient is awake, oriented x3 with mild distress sitting on a chair. VITAL SIGNS:  Temperature is 100.1, pulse is 84, respirations 18, and blood pressure is 112/51. CARDIAC:  S1-S2 are regular.  No murmur, no gallop, and no rub. LUNGS:  Bilateral air entry.  No wheezes or crackles. ABDOMEN:  Soft, positive bowel sounds present.  GU:  Deferred. BACK:  There is mild tenderness in the thoracic spine, tender to touch. NEURO:  She is awake and oriented x3.  Motor 5/5 in all 4 extremities. Cranial nerves II-XII were intact.  ASSESSMENT AND PLAN: 1. Neck and back pain.  We would like to get an MRI of the neck and     back with fever in the morning about this crisis, so I am going to     send the patient for MRI for now. 2. New onset atrial fibrillation with rapid ventricular response.  The     patient is rate controlled now.  She is on heparin drip with rate     control.  Also, we will try rule out pulmonary embolism at this     time. 3. Fever.  Agree with the pan culture.  I will treat empirically for     urinary tract infection at this point with Cipro. 4. Rheumatoid arthritis.  She is on steroids. 5. Gastroesophageal reflux disease, on PPI.           ______________________________ Pincus Large, MD     SA/MEDQ  D:  04/05/2010  T:  04/06/2010  Job:  [id number]  Electronically Signed by Virginia Rochester M.D. on 05/13/2010 04:16:33 PM

## 2010-06-03 NOTE — Discharge Summary (Signed)
[facility]  Patient:    [name], [name]                      MRN: [id number] Adm. Date:  [id number] Disc. Date: 02/29/00 Attending:  Georgann Housekeeper CC:         Demetria Pore. Coral Spikes, M.D.   Discharge Summary  DISCHARGE DIAGNOSES: 1. Right leg pain. 2. 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 3. Possible sciatica.  DISCHARGE MEDICATIONS: 1. Prednisone taper 60 mg down 10 mg each day until she goes back to her 5 gm. 2. Toprol XL 50 mg one tablet q.d. 3. Calcium. 4. Multivitamin. 5. Celebrex 200 mg b.i.d. 6. Darvocet-N 100 q.6-8h. p.r.n.  DISCHARGE INSTRUCTIONS:  Activities:  As per the physical therapy evaluation. Diet:  No restrictions.  DISCHARGE FOLLOWUP:  Follow up in one week in the office with Dr. Coral Spikes or with me.  CONDITION ON DISCHARGE:  Stable.  HISTORY OF PRESENT ILLNESS:  The patient is a _______-year-old with history of rheumatoid arthritis admitted from the clinic after after a couple of days of acute right groin and right buttock pain going down to her leg with some numbness sensation and paresthesias.  She was unable to stand on her feet and had difficulty walking and ambulation.  On her initial evaluation her lab work was all unremarkable including CBC and chemistries.  She underwent an x-ray of the hips.  Right hip was negative.  LS spine x-ray showed degenerative arthritis in L5-S1 disk.  HOSPITAL COURSE:  The patient was admitted for pain control.  Given some Demerol which made her a little drowsy.  She only had to take one to two doses.  Underwent an MRI scan of the lumbosacral area.  The MRI showed a bulge with mild to moderate bulging disk L5-S1 with mild to moderate stenosis, otherwise unremarkable.  The patient on exam was very tender in the right trochanteric area and the sciatic notch and thought to be sciatic inflammation plus/minus trochanteric bursitis.  Was injected with Aristospan and  with Marcaine in the greater trochanter bursa of the right hip plus the sciatic notch.  Also given a dose of Solu-Medrol 50 mg.  The patient started to improve a little bit, was able to stand up and bear weight and walk a few steps.  She still had some pain when she laid down and tried to get some few steps, but that was better than the admission.  Will have physical therapy evaluate her and give her home assessments to carry out some exercises. Continue on a prednisone taper.  Follow up in one week.  Possibly if not better, might need outpatient physical therapy and/or orthopedic evaluation in the future.  Rheumatoid arthritis was stable. DD:  02/29/00 TD:  02/29/00 Job: [id number]
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
## 2010-06-20 NOTE — Consult Note (Signed)
[name], [name]             ACCOUNT NO.:  [id number]  MEDICAL RECORD NO.:  [id number]           PATIENT TYPE:  I  LOCATION:  3709                         FACILITY:  [facility]  PHYSICIAN:  Zenovia Jordan, MD      DATE OF BIRTH:  08-Jul-1940  DATE OF CONSULTATION:  04/08/2010 DATE OF DISCHARGE:                                CONSULTATION   REASON FOR CONSULTATION:  Pleural effusions, fever, and rheumatoid arthritis.  CHIEF COMPLAINT:  This is a 70 year old female who is the patient of mine in Rheumatology Practice for longstanding erosive rheumatoid arthritis.  In the last year she has been well controlled with minimal joint symptoms on Enbrel weekly, prednisone 2.5 mg every other day which has been decreased over the last year, and methotrexate 5 tablets weekly.  About a week ago she noted sudden onset of viral gastroenteritis with severe nausea, vomiting, and fevers for 2 days. After vomiting for a few day she began having severe upper back and neck pain and came to the emergency room.  She was noted on her arrival to the emergency room to be in atrial fibrillation and there was concern about an MI and she was evaluated in the cath lab which was negative. She has improved in regards to her nausea and vomiting and also her cardiac rate with medications by Cardiology but has had continued shortness of breath and was noted to have mild-to-moderate bilateral pleural effusions without infiltrates on the CT of the chest.  Her CT of abdomen and pelvis was negative.  She also had MRIs of her cervical spine which were negative.  She has been holding her methotrexate and Enbrel this last week due to her illness.  She does have some mild pain in the right hand and wrist joints.  She has had persistent low-grade temperature since her onset of her illness despite improvement of her gastroenteritis symptoms.  She notes that her neck and back pain is better with evidently occasional trapezial  pain with certain movements and deep breaths.  Family notes that the patient is mildly more confused throughout the hospitalization.  PAST MEDICAL HISTORY:  Rheumatoid arthritis diagnosed in 1975 which is erosive and GERD.  SOCIAL HISTORY:  No tobacco, no alcohol.  ALLERGIES:  Known drug allergies.  REVIEW OF SYSTEMS:  Positives include edema and pain of the right hand, shortness of breath, upper back pain without rashes, and diffuse swollen joints.  LABORATORY DATA:  White count 6.6, hemoglobin 10.9, hematocrit 33.1, and platelets 242.  Sodium 136, potassium 3.5, chloride 105, bicarb 28, BUN 7, creatinine 0.6, glucose 99.  ESR 63.  CRP 28.7.  CT of the chest bilateral mild-to-moderate effusion.  Abdomen and pelvis CT was negative.  PHYSICAL EXAMINATION:  VITAL SIGNS:  Pulse 88-102, respiratory rate 19, blood pressure 106/35, and T-max was 100.3. GENERAL:  She has no apparent distress.  She was alert and oriented. She did have some mild shortness of breath on exam and was wearing oxygen. HEENT:  Pupils equal, round, reactive to light.  Extraocular muscles intact.  No oral ulcers. CARDIOVASCULAR:  Regular rate and rhythm, S1 and S2.  LUNGS:  Mild lower slight crackles bilaterally. EXTREMITIES:  Mild edema of the right wrist with decreased range of motion which is not new and some nodular OA changes diffusely. SKIN:  No rashes but had bruising.  ASSESSMENT AND PLAN:  This is a very pleasant 70 year old rheumatoid arthritis patient of mine, who I have been following for the last 2 years.  Her artery has been stable for some time, on methotrexate 5 tablets weekly and Enbrel weekly and over the last year we had been decreasing her prednisone down to 2.5 mg every other day.  It is important to note that the patient has frequent bronchitis episodes and is treated with high-dose steroids through her pulmonologist or allergist and every 2 months hospital holds her methotrexate and  Enbrel for this.  She presented with a recent gastroenteritis which progressed to the chest and back pain and then was noted to have new onset AFib. Her gastroenteritis and neck pain has improved but she continues to have persistent fevers and shortness of breath.  It is very common in patients with longstanding rheumatoid arthritis to have pleural effusions which are usually asymptomatic.  These pleural effusions are often related to fevers and typically respond to 10-20 mg of prednisone daily or NSAIDs.  There is also concern, however, in patients who are on Enbrel and methotrexate for atypical infection.  Ideally it would be helpful to have a thoracentesis for diagnostic, however without infiltrates or unilateral effusion, infection may be somewhat less likely.  I would recommend an ID consult to ensure that we are not missing any atypical infection that she would be a risk for taking Enbrel.  At this time, as the effusions typically respond to low-dose steroids, we will increase her prednisone to 10 mg daily, which certainly should not hide the severe infection and we will continue to hold her methotrexate and Enbrel.  I also have some concerns that she could have some element of adrenal insufficiency with the high-dose steroids that she receives every few months for her recurrent bronchitis, and I too think that she does have an underlying element of mild shortness of breath.  For completeness, I will order a PPD to be placed and hepatitis serologies.  Lymphoma would also be a concern in this patient as she does have a significantly higher risk secondary to her underlying rheumatoid arthritis and we will send an SPEP and UPEP. Fortunately, the CT of her abdomen and pelvis which was negative, which is reassuring.     Zenovia Jordan, MD     AH/MEDQ  D:  04/08/2010  T:  04/09/2010  Job:  [id number]  Electronically Signed by Zenovia Jordan  on 06/20/2010 08:18:41 AM

## 2012-10-19 ENCOUNTER — Other Ambulatory Visit: Payer: Self-pay | Admitting: Interventional Cardiology

## 2012-11-07 ENCOUNTER — Ambulatory Visit (INDEPENDENT_AMBULATORY_CARE_PROVIDER_SITE_OTHER): Payer: Medicare Other | Admitting: Pharmacist

## 2012-11-07 DIAGNOSIS — Z7901 Long term (current) use of anticoagulants: Secondary | ICD-10-CM

## 2012-11-07 DIAGNOSIS — I4891 Unspecified atrial fibrillation: Secondary | ICD-10-CM

## 2012-11-08 ENCOUNTER — Telehealth: Payer: Self-pay | Admitting: Interventional Cardiology

## 2012-11-08 NOTE — Telephone Encounter (Signed)
Ok to hold coumadin 5 days before colonoscopy?

## 2012-11-08 NOTE — Telephone Encounter (Signed)
Message copied by Corky Crafts on Fri Nov 08, 2012  4:58 PM ------      Message from: Audrie Lia R      Created: Thu Nov 07, 2012 11:44 AM       Pt pending a colonoscopy in November.  Needs okay to hold Coumadin.  Please advise.  ------

## 2012-11-11 NOTE — Telephone Encounter (Signed)
Pt notified. To Dr. Danise Edge.

## 2012-12-19 ENCOUNTER — Ambulatory Visit (INDEPENDENT_AMBULATORY_CARE_PROVIDER_SITE_OTHER): Payer: Medicare Other | Admitting: Pharmacist

## 2012-12-19 DIAGNOSIS — I4891 Unspecified atrial fibrillation: Secondary | ICD-10-CM

## 2012-12-19 DIAGNOSIS — Z7901 Long term (current) use of anticoagulants: Secondary | ICD-10-CM

## 2012-12-19 LAB — POCT INR: INR: 2.8

## 2013-01-31 ENCOUNTER — Telehealth: Payer: Self-pay | Admitting: Interventional Cardiology

## 2013-01-31 ENCOUNTER — Ambulatory Visit (INDEPENDENT_AMBULATORY_CARE_PROVIDER_SITE_OTHER): Payer: Medicare Other | Admitting: Pharmacist

## 2013-01-31 DIAGNOSIS — I4891 Unspecified atrial fibrillation: Secondary | ICD-10-CM

## 2013-01-31 DIAGNOSIS — Z7901 Long term (current) use of anticoagulants: Secondary | ICD-10-CM

## 2013-01-31 LAB — POCT INR: INR: 2.2

## 2013-01-31 NOTE — Telephone Encounter (Signed)
Spoke with pt and her Metoprolol was costing her $2.55 a month and now it is costing her $65. Pts insurance company told her that we needed to do a PA. The insurance company's # [phone].

## 2013-01-31 NOTE — Telephone Encounter (Signed)
Mindy, have we received anything from pts insurance company?

## 2013-01-31 NOTE — Telephone Encounter (Signed)
New message     Need metoprolol preauthorized so that ins will pay.  [employer] ins[facility].    pls call [employer] at [phone] to get this approved.  Pt has not been seen at new office.

## 2013-02-10 NOTE — Telephone Encounter (Signed)
[name], have we received anything on this patient?

## 2013-02-11 NOTE — Telephone Encounter (Signed)
Have not received anything, I will call them so they can fax me correct form.

## 2013-02-13 ENCOUNTER — Other Ambulatory Visit: Payer: Self-pay | Admitting: *Deleted

## 2013-02-13 NOTE — Telephone Encounter (Signed)
Called [employer] regarding change in price for patients metoprolol, no PA but possibly can do a tier exception.

## 2013-02-13 NOTE — Telephone Encounter (Signed)
Thank you, let me know if you need me to do anything.

## 2013-02-14 NOTE — Telephone Encounter (Signed)
Is [employer] faxing over form for [place] to fill out?

## 2013-02-18 ENCOUNTER — Telehealth: Payer: Self-pay | Admitting: Interventional Cardiology

## 2013-02-18 NOTE — Telephone Encounter (Signed)
New problem    [name]  @ [facility]  Mariana Arn called wants pt to stop taking  Coumadin.   Please fax instructions of how long before and after before procedure.  Fax # [phone]  Phone # [phone]   Any questions please give them a call.

## 2013-02-18 NOTE — Telephone Encounter (Signed)
Spoke with Abigail Butts with Dr. Tanna Furry office.  Pt is considering a peridontal and root cleaning, which would require her to be off anticoagulation.  Will need to be cleared by Dr. Irish Lack.  Pt told dental office if she could not stop the Coumadin due to risks that she would just not do this procedure.

## 2013-02-21 ENCOUNTER — Telehealth: Payer: Self-pay | Admitting: Interventional Cardiology

## 2013-02-21 MED ORDER — METOPROLOL TARTRATE 50 MG PO TABS: 50.0000 mg | ORAL_TABLET | Freq: Two times a day (BID) | ORAL | Status: DC

## 2013-02-21 NOTE — Telephone Encounter (Signed)
Returned pt's call.

## 2013-02-21 NOTE — Addendum Note (Signed)
Addended byUlla Potash H on: 02/21/2013 01:30 PM   Modules accepted: Orders, Medications

## 2013-02-21 NOTE — Telephone Encounter (Signed)
Spoke with pt and she is willing to try lopressor 50 mg BID. Rx sent into pharmacy.

## 2013-02-21 NOTE — Telephone Encounter (Signed)
Follow Up ° °Pt returned call//  °

## 2013-02-21 NOTE — Telephone Encounter (Signed)
Per Dr. Irish Lack, pt can switch to Metoprolol Tartrate 50 mg twice a day. This should be a cheaper option and will work the same.

## 2013-02-21 NOTE — Telephone Encounter (Signed)
L/m for pt to return call

## 2013-02-21 NOTE — Addendum Note (Signed)
Addended byUlla Potash H on: 02/21/2013 01:31 PM   Modules accepted: Orders, Medications

## 2013-02-25 NOTE — Telephone Encounter (Signed)
Okay to hold Coumadin 5 days prior to dental cleaning if needed.

## 2013-02-27 NOTE — Telephone Encounter (Signed)
Faxed to Dr. Tanna Furry office. Pt notified.

## 2013-03-14 ENCOUNTER — Ambulatory Visit (INDEPENDENT_AMBULATORY_CARE_PROVIDER_SITE_OTHER): Payer: Medicare Other | Admitting: Pharmacist

## 2013-03-14 DIAGNOSIS — I4891 Unspecified atrial fibrillation: Secondary | ICD-10-CM

## 2013-03-14 DIAGNOSIS — Z7901 Long term (current) use of anticoagulants: Secondary | ICD-10-CM

## 2013-03-14 LAB — POCT INR: INR: 2.9

## 2013-03-31 ENCOUNTER — Telehealth: Payer: Self-pay | Admitting: *Deleted

## 2013-03-31 NOTE — Telephone Encounter (Signed)
[employer] approved Tier Exception Request for metoprolol succinate, it is now a TIER 1,

## 2013-04-01 MED ORDER — METOPROLOL SUCCINATE ER 100 MG PO TB24: 100.0000 mg | ORAL_TABLET | Freq: Every day | ORAL | Status: DC

## 2013-04-01 NOTE — Addendum Note (Signed)
Addended byUlla Potash H on: 04/01/2013 08:20 AM   Modules accepted: Orders, Medications

## 2013-04-01 NOTE — Telephone Encounter (Signed)
Pt is currently still taking Toprol Xl 100 mg qd and she will continue and if the cost is any issue she will call me back. Then we ill put her on the Metoprolol Tartrate 50 mg BID.

## 2013-04-08 ENCOUNTER — Other Ambulatory Visit: Payer: Self-pay | Admitting: Cardiology

## 2013-04-08 ENCOUNTER — Other Ambulatory Visit: Payer: Self-pay | Admitting: Interventional Cardiology

## 2013-04-08 MED ORDER — VERAPAMIL HCL ER 120 MG PO TBCR: 120.0000 mg | EXTENDED_RELEASE_TABLET | Freq: Every day | ORAL | Status: DC

## 2013-04-15 ENCOUNTER — Other Ambulatory Visit: Payer: Self-pay

## 2013-04-16 ENCOUNTER — Telehealth: Payer: Self-pay | Admitting: Cardiology

## 2013-04-16 MED ORDER — DIGOXIN 125 MCG PO TABS: 0.1250 mg | ORAL_TABLET | Freq: Every day | ORAL | Status: DC

## 2013-04-16 NOTE — Telephone Encounter (Signed)
Refilled

## 2013-04-25 ENCOUNTER — Ambulatory Visit (INDEPENDENT_AMBULATORY_CARE_PROVIDER_SITE_OTHER): Payer: Medicare Other | Admitting: Pharmacist

## 2013-04-25 DIAGNOSIS — Z7901 Long term (current) use of anticoagulants: Secondary | ICD-10-CM

## 2013-04-25 DIAGNOSIS — I4891 Unspecified atrial fibrillation: Secondary | ICD-10-CM

## 2013-04-25 LAB — POCT INR: INR: 2.1

## 2013-05-15 DIAGNOSIS — E785 Hyperlipidemia, unspecified: Secondary | ICD-10-CM | POA: Insufficient documentation

## 2013-05-15 DIAGNOSIS — M25473 Effusion, unspecified ankle: Secondary | ICD-10-CM | POA: Insufficient documentation

## 2013-05-15 DIAGNOSIS — I10 Essential (primary) hypertension: Secondary | ICD-10-CM | POA: Insufficient documentation

## 2013-05-19 ENCOUNTER — Ambulatory Visit (INDEPENDENT_AMBULATORY_CARE_PROVIDER_SITE_OTHER): Payer: Medicare Other | Admitting: Pharmacist

## 2013-05-19 ENCOUNTER — Encounter: Payer: Self-pay | Admitting: Interventional Cardiology

## 2013-05-19 ENCOUNTER — Ambulatory Visit (INDEPENDENT_AMBULATORY_CARE_PROVIDER_SITE_OTHER): Payer: Medicare Other | Admitting: Interventional Cardiology

## 2013-05-19 VITALS — BP 136/70 | HR 65 | Ht 64.0 in | Wt 162.0 lb

## 2013-05-19 DIAGNOSIS — I10 Essential (primary) hypertension: Secondary | ICD-10-CM

## 2013-05-19 DIAGNOSIS — Z7901 Long term (current) use of anticoagulants: Secondary | ICD-10-CM

## 2013-05-19 DIAGNOSIS — I4891 Unspecified atrial fibrillation: Secondary | ICD-10-CM

## 2013-05-19 DIAGNOSIS — R609 Edema, unspecified: Secondary | ICD-10-CM

## 2013-05-19 DIAGNOSIS — M25473 Effusion, unspecified ankle: Secondary | ICD-10-CM

## 2013-05-19 DIAGNOSIS — E785 Hyperlipidemia, unspecified: Secondary | ICD-10-CM

## 2013-05-19 LAB — POCT INR: INR: 2.1

## 2013-05-19 NOTE — Patient Instructions (Signed)
Your physician recommends that you return for a FASTING lipid profile and heptic panel on  Your physician recommends that you continue on your current medications as directed. Please refer to the Current Medication list given to you today.  Your physician wants you to follow-up in: 1 year with Dr. Irish Lack. You will receive a reminder letter in the mail two months in advance. If you don't receive a letter, please call our office to schedule the follow-up appointment.

## 2013-05-19 NOTE — Progress Notes (Signed)
Patient ID: [name], female   DOB: 1940-05-01, 73 y.o.   MRN: [id number]    [place], [place] [place], [place]  [address] Phone: [phone] Fax:  [phone]  Date:  05/19/2013   ID:  [name], Mathenia 1940-10-04, MRN [id number]  PCP:  Default, Provider, MD      History of Present Illness: [name] is a 73 y.o. female who has had AFib. She has had some nosebleeds. She had her left nose cauterized, twice.  She has had much fewer nosebleeds. She can get them to stop with compression. Had to have mouth cauterized.  Atrial Fibrillation F/U:  Denies : Chest pain.  Dizziness.  Leg edema.  Orthopnea.  Palpitations.  Syncope.  She has some ankle pain which limits walking.  Se stays active.    Wt Readings from Last 3 Encounters:  05/19/13 162 lb (73.483 kg)     Past Medical History  Diagnosis Date  . Esophageal reflux   . Rheumatoid arthritis 1975  . Osteopenia   . Polymorphic light eruption 2004  . Hyperlipidemia   . Adhesive capsulitis of left shoulder 1980  . Right middle ear infection 1986  . Acne rosacea   . Allergic rhinitis   . Hypercholesterolemia   . Atrial fibrillation   . Hypertension   . Edema     Current Outpatient Prescriptions  Medication Sig Dispense Refill  . Calcium Carbonate-Vitamin D (CALCIUM + D PO) Take 1,000 mg by mouth.      . cholecalciferol (VITAMIN D) 1000 UNITS tablet Take 2,000 Units by mouth daily.      . digoxin (DIGOX) 0.125 MG tablet Take 1 tablet (0.125 mg total) by mouth daily.  30 tablet  4  . estradiol (VIVELLE-DOT) 0.075 MG/24HR Place 1 patch onto the skin 2 (two) times a week.      . etanercept (ENBREL) 50 MG/ML injection Inject 50 mg into the skin once a week.      . fluticasone (FLONASE) 50 MCG/ACT nasal spray Place into both nostrils daily.      . folic acid (FOLVITE) 1 MG tablet Take 1 mg by mouth daily. 2 tabs      . furosemide (LASIX) 40 MG tablet Take 40 mg by mouth.      . methotrexate 2.5 MG  tablet Take by mouth 3 (three) times a week. 3 tablets on on tuesdays 3 tablets on wednesdays      . metoprolol succinate (TOPROL-XL) 100 MG 24 hr tablet Take 1 tablet (100 mg total) by mouth daily. Take with or immediately following a meal.  90 tablet  3  . Multiple Vitamins-Minerals (MULTIVITAMIN WITH MINERALS) tablet Take 1 tablet by mouth daily.      Marland Kitchen omeprazole (PRILOSEC) 20 MG capsule Take 20 mg by mouth daily.      . pravastatin (PRAVACHOL) 20 MG tablet Take 20 mg by mouth daily.      . predniSONE (DELTASONE) 5 MG tablet Take 5 mg by mouth daily with breakfast.      . verapamil (CALAN-SR) 120 MG CR tablet TAKE 1 TABLET BY MOUTH EVERY MORNING WITH FOOD  30 tablet  1  . warfarin (COUMADIN) 5 MG tablet 5mg  tablet daily except 1/2 tablet on Tuesday  35 tablet  5   No current facility-administered medications for this visit.    Allergies:    Allergies  Allergen Reactions  . Arthrotec [Diclofenac-Misoprostol] Diarrhea  . Morphine Sulfate   .  Amoxicillin Rash  . Gold Bond [Menthol-Zinc Oxide] Rash  . Macrodantin [Nitrofurantoin Macrocrystal] Rash  . Naproxen Rash  . Penicillins Rash    Social History:  The patient  reports that she has never smoked. She has never used smokeless tobacco. She reports that she does not drink alcohol or use illicit drugs.   Family History:  The patient's family history is not on file.   ROS:  Please see the history of present illness.  No nausea, vomiting.  No fevers, chills.  No focal weakness.  No dysuria.    All other systems reviewed and negative.   PHYSICAL EXAM: VS:  BP 136/70  Pulse 65  Ht 5\' 4"  (1.626 m)  Wt 162 lb (73.483 kg)  BMI 27.79 kg/m2 Well nourished, well developed, in no acute distress HEENT: normal Neck: no JVD, no carotid bruits Cardiac:  normal S1, S2; RRR;  Lungs:  clear to auscultation bilaterally, no wheezing, rhonchi or rales Abd: soft, nontender, no hepatomegaly Ext: no edema Skin: warm and dry Neuro:   no focal  abnormalities noted  EKG:  Normal sinus rhythm, nonspecific ST segment changes    ASSESSMENT AND PLAN:   Atrial fibrillation  IMAGING: EKG    [name] 05/20/2012 11:49:15 AM > [name],[name] 05/20/2012 12:12:47 PM > sinus bradycardia, no ST segment changes   Notes: Maintaining NSR.    2. Essential hypertension, benign  Notes: Controlled at home.    3. Encounter for long-term (current) use of high-risk medication  Notes: INR to be checked and coumadin dose to be adjusted with PharmD. Several bleeding issues have now resolved for the most part. Stressed imprtance of coumadin for stroke prevention.    4. Ankle edema  Continue Lasix Tablet, 40 MG, 2 tab Mon, Tues and Weds and 1 tablet all other days, Orally, Once a day Notes: Elevate legs. Could consider compresion stockings.   5. Hyperlipidemia: Continue pravastatin.  LDL 78.   Preventive Medicine  Adult topics discussed:  Diet: healthy diet, low calorie, low fat.  Exercise: 5 days a week, at least 30 minutes of aerobic exercise.      Signed, Mina Marble, MD, [facility] 05/19/2013 11:57 AM

## 2013-05-21 ENCOUNTER — Ambulatory Visit (INDEPENDENT_AMBULATORY_CARE_PROVIDER_SITE_OTHER): Payer: Medicare Other | Admitting: *Deleted

## 2013-05-21 DIAGNOSIS — E785 Hyperlipidemia, unspecified: Secondary | ICD-10-CM

## 2013-05-21 DIAGNOSIS — M069 Rheumatoid arthritis, unspecified: Secondary | ICD-10-CM

## 2013-05-21 LAB — LIPID PANEL
CHOL/HDL RATIO: 4
Cholesterol: 145 mg/dL (ref 0–200)
HDL: 36.6 mg/dL — ABNORMAL LOW (ref >39.00)
LDL Cholesterol: 79 mg/dL (ref 0–99)
Triglycerides: 148 mg/dL (ref 0.0–149.0)
VLDL: 29.6 mg/dL (ref 0.0–40.0)

## 2013-05-21 LAB — CBC WITH DIFFERENTIAL/PLATELET
BASOS ABS: 0 10*3/uL (ref 0.0–0.1)
Basophils Relative: 0.5 % (ref 0.0–3.0)
Eosinophils Absolute: 0.2 10*3/uL (ref 0.0–0.7)
Eosinophils Relative: 2.9 % (ref 0.0–5.0)
HEMATOCRIT: 34 % — AB (ref 36.0–46.0)
HEMOGLOBIN: 11 g/dL — AB (ref 12.0–15.0)
Lymphocytes Relative: 17.5 % (ref 12.0–46.0)
Lymphs Abs: 1.4 10*3/uL (ref 0.7–4.0)
MCHC: 32.3 g/dL (ref 30.0–36.0)
MCV: 84.6 fl (ref 78.0–100.0)
MONO ABS: 0.7 10*3/uL (ref 0.1–1.0)
Monocytes Relative: 8.6 % (ref 3.0–12.0)
NEUTROS ABS: 5.7 10*3/uL (ref 1.4–7.7)
Neutrophils Relative %: 70.5 % (ref 43.0–77.0)
PLATELETS: 226 10*3/uL (ref 150.0–400.0)
RBC: 4.02 Mil/uL (ref 3.87–5.11)
RDW: 21.6 % — ABNORMAL HIGH (ref 11.5–15.5)
WBC: 8.1 10*3/uL (ref 4.0–10.5)

## 2013-05-21 LAB — HEPATIC FUNCTION PANEL
ALK PHOS: 63 U/L (ref 39–117)
ALT: 57 U/L — ABNORMAL HIGH (ref 0–35)
AST: 43 U/L — AB (ref 0–37)
Albumin: 3.7 g/dL (ref 3.5–5.2)
BILIRUBIN DIRECT: 0.1 mg/dL (ref 0.0–0.3)
Total Bilirubin: 0.4 mg/dL (ref 0.2–1.2)
Total Protein: 6.7 g/dL (ref 6.0–8.3)

## 2013-05-21 LAB — BASIC METABOLIC PANEL
BUN: 12 mg/dL (ref 6–23)
CO2: 29 mEq/L (ref 19–32)
CREATININE: 0.9 mg/dL (ref 0.4–1.2)
Calcium: 9 mg/dL (ref 8.4–10.5)
Chloride: 104 mEq/L (ref 96–112)
GFR: 68.06 mL/min (ref >60.00)
Glucose, Bld: 115 mg/dL — ABNORMAL HIGH (ref 70–99)
Potassium: 3.5 mEq/L (ref 3.5–5.1)
SODIUM: 140 meq/L (ref 135–145)

## 2013-05-21 LAB — C-REACTIVE PROTEIN: CRP: 1.2 mg/dL (ref 0.5–20.0)

## 2013-05-22 ENCOUNTER — Other Ambulatory Visit: Payer: Medicare Other

## 2013-05-27 ENCOUNTER — Telehealth: Payer: Self-pay | Admitting: Cardiology

## 2013-05-27 ENCOUNTER — Other Ambulatory Visit: Payer: Self-pay | Admitting: Interventional Cardiology

## 2013-05-27 ENCOUNTER — Encounter: Payer: Self-pay | Admitting: Cardiology

## 2013-05-27 DIAGNOSIS — Z79899 Other long term (current) drug therapy: Secondary | ICD-10-CM

## 2013-05-27 NOTE — Telephone Encounter (Signed)
Pt notified. Labs ordered.

## 2013-05-27 NOTE — Telephone Encounter (Signed)
Message copied by Alcario Drought on Tue May 27, 2013 10:47 AM ------      Message from: SMART, Maralyn Sago      Created: Thu May 22, 2013  4:30 PM       Continue pravastatin 20 mg qhs. LFTs slightly elevated. Recheck hepatic panel in 4 weeks. Recommend limiting tylenol and/or alcohol for the next 4 weeks.  Please notify patient, and set up labs. Thanks. ------

## 2013-06-10 ENCOUNTER — Other Ambulatory Visit: Payer: Self-pay | Admitting: Pharmacist Clinician (PhC)/ Clinical Pharmacy Specialist

## 2013-06-10 MED ORDER — WARFARIN SODIUM 5 MG PO TABS: ORAL_TABLET | ORAL | Status: DC

## 2013-06-23 ENCOUNTER — Other Ambulatory Visit (INDEPENDENT_AMBULATORY_CARE_PROVIDER_SITE_OTHER): Payer: Medicare Other

## 2013-06-23 DIAGNOSIS — Z79899 Other long term (current) drug therapy: Secondary | ICD-10-CM

## 2013-06-23 LAB — HEPATIC FUNCTION PANEL
ALT: 88 U/L — ABNORMAL HIGH (ref 0–35)
AST: 69 U/L — ABNORMAL HIGH (ref 0–37)
Albumin: 3.6 g/dL (ref 3.5–5.2)
Alkaline Phosphatase: 69 U/L (ref 39–117)
Bilirubin, Direct: 0.1 mg/dL (ref 0.0–0.3)
Total Bilirubin: 0.6 mg/dL (ref 0.2–1.2)
Total Protein: 6.3 g/dL (ref 6.0–8.3)

## 2013-06-26 ENCOUNTER — Telehealth: Payer: Self-pay | Admitting: Cardiology

## 2013-06-26 DIAGNOSIS — Z79899 Other long term (current) drug therapy: Secondary | ICD-10-CM

## 2013-06-26 MED ORDER — PRAVASTATIN SODIUM 20 MG PO TABS: ORAL_TABLET | ORAL | Status: DC

## 2013-06-26 NOTE — Telephone Encounter (Signed)
Message copied by Alcario Drought on Thu Jun 26, 2013 10:03 AM ------      Message from: SMART, Maralyn Sago      Created: Tue Jun 24, 2013  3:14 PM       LFTs continue to trend up.  She is on pravastatin currently.  Low risk patient.  Also on methotrexate which could be contributing to elevated LFTs.      Plan:      1.  Stop pravastatin for now.  Limit tylenol and alcohol.      2.  Continue methotrexate.      3.  Recheck hepatic panel again in 4 weeks.      Please notify patient, update meds, and set up labs. Thanks. ------

## 2013-06-26 NOTE — Telephone Encounter (Signed)
Pt notified. Meds updated and labs ordered.  

## 2013-06-30 ENCOUNTER — Ambulatory Visit (INDEPENDENT_AMBULATORY_CARE_PROVIDER_SITE_OTHER): Payer: Medicare Other | Admitting: Pharmacist

## 2013-06-30 DIAGNOSIS — I4891 Unspecified atrial fibrillation: Secondary | ICD-10-CM

## 2013-06-30 DIAGNOSIS — Z7901 Long term (current) use of anticoagulants: Secondary | ICD-10-CM

## 2013-06-30 LAB — POCT INR: INR: 1.9

## 2013-07-14 ENCOUNTER — Ambulatory Visit (INDEPENDENT_AMBULATORY_CARE_PROVIDER_SITE_OTHER): Payer: Medicare Other | Admitting: *Deleted

## 2013-07-14 DIAGNOSIS — Z5181 Encounter for therapeutic drug level monitoring: Secondary | ICD-10-CM | POA: Insufficient documentation

## 2013-07-14 DIAGNOSIS — Z7901 Long term (current) use of anticoagulants: Secondary | ICD-10-CM

## 2013-07-14 DIAGNOSIS — I4891 Unspecified atrial fibrillation: Secondary | ICD-10-CM

## 2013-07-14 LAB — POCT INR: INR: 1.7

## 2013-07-28 ENCOUNTER — Other Ambulatory Visit (INDEPENDENT_AMBULATORY_CARE_PROVIDER_SITE_OTHER): Payer: Medicare Other

## 2013-07-28 ENCOUNTER — Ambulatory Visit (INDEPENDENT_AMBULATORY_CARE_PROVIDER_SITE_OTHER): Payer: Medicare Other | Admitting: Pharmacist

## 2013-07-28 DIAGNOSIS — Z79899 Other long term (current) drug therapy: Secondary | ICD-10-CM

## 2013-07-28 DIAGNOSIS — I4891 Unspecified atrial fibrillation: Secondary | ICD-10-CM

## 2013-07-28 DIAGNOSIS — Z7901 Long term (current) use of anticoagulants: Secondary | ICD-10-CM

## 2013-07-28 DIAGNOSIS — Z5181 Encounter for therapeutic drug level monitoring: Secondary | ICD-10-CM

## 2013-07-28 LAB — HEPATIC FUNCTION PANEL
ALBUMIN: 4 g/dL (ref 3.5–5.2)
ALK PHOS: 71 U/L (ref 39–117)
ALT: 75 U/L — AB (ref 0–35)
AST: 59 U/L — AB (ref 0–37)
BILIRUBIN DIRECT: 0 mg/dL (ref 0.0–0.3)
TOTAL PROTEIN: 7.2 g/dL (ref 6.0–8.3)
Total Bilirubin: 0.4 mg/dL (ref 0.2–1.2)

## 2013-07-28 LAB — POCT INR: INR: 1.6

## 2013-07-31 ENCOUNTER — Other Ambulatory Visit: Payer: Self-pay | Admitting: Cardiology

## 2013-07-31 DIAGNOSIS — Z79899 Other long term (current) drug therapy: Secondary | ICD-10-CM

## 2013-08-11 ENCOUNTER — Ambulatory Visit (INDEPENDENT_AMBULATORY_CARE_PROVIDER_SITE_OTHER): Payer: Medicare Other | Admitting: Pharmacist

## 2013-08-11 DIAGNOSIS — Z7901 Long term (current) use of anticoagulants: Secondary | ICD-10-CM

## 2013-08-11 DIAGNOSIS — Z5181 Encounter for therapeutic drug level monitoring: Secondary | ICD-10-CM

## 2013-08-11 DIAGNOSIS — I4891 Unspecified atrial fibrillation: Secondary | ICD-10-CM

## 2013-08-11 LAB — POCT INR: INR: 2.4

## 2013-08-20 ENCOUNTER — Encounter: Payer: Self-pay | Admitting: Interventional Cardiology

## 2013-08-24 ENCOUNTER — Other Ambulatory Visit: Payer: Self-pay | Admitting: Interventional Cardiology

## 2013-08-25 ENCOUNTER — Telehealth: Payer: Self-pay | Admitting: Interventional Cardiology

## 2013-08-25 DIAGNOSIS — Z79899 Other long term (current) drug therapy: Secondary | ICD-10-CM

## 2013-08-25 NOTE — Telephone Encounter (Signed)
New message     Did we get lab work from Dr Berna Bue office? If yes, will pt still need to come in for labs?

## 2013-08-25 NOTE — Telephone Encounter (Signed)
Doesn't need to come in for labs.  LFTs much improved over past 3 weeks (ALT down from 75 to 57; AST down from 59 to 37) since being off pravastatin.  Patient's LFTs also appeared to originally go up after Dr. Trudie Reed changed dose of methotrexate.  Please find out if Dr. Trudie Reed has reduced the methotrexate dose lately, as that may have contributed to LFTs trending down.

## 2013-08-26 NOTE — Telephone Encounter (Signed)
Pt notified. Pt is currently taking Methotrexate 2.5 mg 3 tablets tues and wed every week. Pt states she has been on this dose about 2 months or so.

## 2013-08-26 NOTE — Telephone Encounter (Signed)
lmtrc

## 2013-08-26 NOTE — Telephone Encounter (Signed)
Will consider starting pravastatin again in 2 months if LFTs remain normal.  Stay off pravastatin for now and recheck hepatic panel in 2 months.  Please notify and set up labs. Thanks.

## 2013-08-28 ENCOUNTER — Other Ambulatory Visit: Payer: Medicare Other

## 2013-08-28 NOTE — Telephone Encounter (Signed)
Pt notified,labs ordered and scheduled.

## 2013-09-01 ENCOUNTER — Ambulatory Visit (INDEPENDENT_AMBULATORY_CARE_PROVIDER_SITE_OTHER): Payer: Medicare Other

## 2013-09-01 DIAGNOSIS — I4891 Unspecified atrial fibrillation: Secondary | ICD-10-CM

## 2013-09-01 DIAGNOSIS — Z7901 Long term (current) use of anticoagulants: Secondary | ICD-10-CM

## 2013-09-01 DIAGNOSIS — Z5181 Encounter for therapeutic drug level monitoring: Secondary | ICD-10-CM

## 2013-09-01 LAB — POCT INR: INR: 3.1

## 2013-09-09 ENCOUNTER — Other Ambulatory Visit: Payer: Self-pay | Admitting: Interventional Cardiology

## 2013-09-18 ENCOUNTER — Ambulatory Visit (INDEPENDENT_AMBULATORY_CARE_PROVIDER_SITE_OTHER): Payer: Medicare Other | Admitting: *Deleted

## 2013-09-18 DIAGNOSIS — I4891 Unspecified atrial fibrillation: Secondary | ICD-10-CM

## 2013-09-18 DIAGNOSIS — Z7901 Long term (current) use of anticoagulants: Secondary | ICD-10-CM

## 2013-09-18 DIAGNOSIS — Z5181 Encounter for therapeutic drug level monitoring: Secondary | ICD-10-CM

## 2013-09-18 LAB — POCT INR: INR: 2.5

## 2013-10-15 ENCOUNTER — Telehealth: Payer: Self-pay | Admitting: Interventional Cardiology

## 2013-10-15 NOTE — Telephone Encounter (Signed)
New message  Pt husband called pt went to the Dr. Running a temp of 102. Was given cough medicine and medicine for phnemonia.Canceled Coumadin appt/ Doesn't require a call back just wants this documented//sr

## 2013-10-20 ENCOUNTER — Ambulatory Visit
Admission: RE | Admit: 2013-10-20 | Discharge: 2013-10-20 | Disposition: A | Discharge status: Home / Self Care | Payer: Medicare Other | Source: Ambulatory Visit | Attending: Internal Medicine | Admitting: Internal Medicine

## 2013-10-20 ENCOUNTER — Telehealth: Payer: Self-pay | Admitting: *Deleted

## 2013-10-20 ENCOUNTER — Other Ambulatory Visit: Payer: Self-pay | Admitting: Internal Medicine

## 2013-10-20 DIAGNOSIS — J189 Pneumonia, unspecified organism: Secondary | ICD-10-CM

## 2013-10-20 NOTE — Telephone Encounter (Signed)
Spouse called, pt has PNA and asthmatic bronchitis. She has been on Levaquin 500mg s QD. Saw her PCP today and was put on another ABX. Spouse will call tomorrow with name of new ABX. Per Dr Hedy Camara, skipped today then resume. Eat some leafy green vegetables. Come in on Thursday.

## 2013-10-21 ENCOUNTER — Telehealth: Payer: Self-pay | Admitting: *Deleted

## 2013-10-21 NOTE — Telephone Encounter (Signed)
Pt called stating had been placed on Bactrim DS 1 bid and started last night. Did not take coumadin as instructed last night then to resume same dose.Has appt to be seen Thursday October 8th and pt states understanding of these instructions

## 2013-10-23 ENCOUNTER — Ambulatory Visit (INDEPENDENT_AMBULATORY_CARE_PROVIDER_SITE_OTHER): Payer: Medicare Other | Admitting: *Deleted

## 2013-10-23 DIAGNOSIS — I4891 Unspecified atrial fibrillation: Secondary | ICD-10-CM

## 2013-10-23 DIAGNOSIS — Z7901 Long term (current) use of anticoagulants: Secondary | ICD-10-CM

## 2013-10-23 DIAGNOSIS — Z5181 Encounter for therapeutic drug level monitoring: Secondary | ICD-10-CM

## 2013-10-23 LAB — POCT INR: INR: 2.3

## 2013-10-28 ENCOUNTER — Ambulatory Visit (INDEPENDENT_AMBULATORY_CARE_PROVIDER_SITE_OTHER): Payer: Medicare Other

## 2013-10-28 ENCOUNTER — Other Ambulatory Visit (INDEPENDENT_AMBULATORY_CARE_PROVIDER_SITE_OTHER): Payer: Medicare Other | Admitting: *Deleted

## 2013-10-28 DIAGNOSIS — I4891 Unspecified atrial fibrillation: Secondary | ICD-10-CM

## 2013-10-28 DIAGNOSIS — Z5181 Encounter for therapeutic drug level monitoring: Secondary | ICD-10-CM

## 2013-10-28 DIAGNOSIS — Z7901 Long term (current) use of anticoagulants: Secondary | ICD-10-CM

## 2013-10-28 DIAGNOSIS — Z79899 Other long term (current) drug therapy: Secondary | ICD-10-CM

## 2013-10-28 LAB — HEPATIC FUNCTION PANEL
ALBUMIN: 3.5 g/dL (ref 3.5–5.2)
ALK PHOS: 67 U/L (ref 39–117)
ALT: 47 U/L — AB (ref 0–35)
AST: 57 U/L — AB (ref 0–37)
BILIRUBIN DIRECT: 0 mg/dL (ref 0.0–0.3)
TOTAL PROTEIN: 8.2 g/dL (ref 6.0–8.3)
Total Bilirubin: 0.4 mg/dL (ref 0.2–1.2)

## 2013-10-28 LAB — POCT INR: INR: 2.5

## 2013-10-29 ENCOUNTER — Emergency Department (HOSPITAL_COMMUNITY)
Admission: EM | Admit: 2013-10-29 | Discharge: 2013-10-29 | Disposition: A | Discharge status: Home / Self Care | Payer: Medicare Other | Attending: Emergency Medicine | Admitting: Emergency Medicine

## 2013-10-29 ENCOUNTER — Emergency Department (HOSPITAL_COMMUNITY): Payer: Medicare Other

## 2013-10-29 ENCOUNTER — Encounter (HOSPITAL_COMMUNITY): Payer: Self-pay | Admitting: Emergency Medicine

## 2013-10-29 DIAGNOSIS — Z880 Allergy status to penicillin: Secondary | ICD-10-CM | POA: Insufficient documentation

## 2013-10-29 DIAGNOSIS — R05 Cough: Secondary | ICD-10-CM

## 2013-10-29 DIAGNOSIS — R059 Cough, unspecified: Secondary | ICD-10-CM

## 2013-10-29 DIAGNOSIS — J029 Acute pharyngitis, unspecified: Secondary | ICD-10-CM | POA: Diagnosis not present

## 2013-10-29 DIAGNOSIS — R0602 Shortness of breath: Secondary | ICD-10-CM | POA: Insufficient documentation

## 2013-10-29 DIAGNOSIS — J309 Allergic rhinitis, unspecified: Secondary | ICD-10-CM | POA: Diagnosis not present

## 2013-10-29 DIAGNOSIS — Z7952 Long term (current) use of systemic steroids: Secondary | ICD-10-CM | POA: Insufficient documentation

## 2013-10-29 DIAGNOSIS — I10 Essential (primary) hypertension: Secondary | ICD-10-CM | POA: Insufficient documentation

## 2013-10-29 DIAGNOSIS — Z792 Long term (current) use of antibiotics: Secondary | ICD-10-CM | POA: Diagnosis not present

## 2013-10-29 DIAGNOSIS — Z7951 Long term (current) use of inhaled steroids: Secondary | ICD-10-CM | POA: Diagnosis not present

## 2013-10-29 DIAGNOSIS — Z7901 Long term (current) use of anticoagulants: Secondary | ICD-10-CM | POA: Insufficient documentation

## 2013-10-29 DIAGNOSIS — Z79899 Other long term (current) drug therapy: Secondary | ICD-10-CM | POA: Insufficient documentation

## 2013-10-29 DIAGNOSIS — I4891 Unspecified atrial fibrillation: Secondary | ICD-10-CM | POA: Diagnosis not present

## 2013-10-29 DIAGNOSIS — E780 Pure hypercholesterolemia: Secondary | ICD-10-CM | POA: Diagnosis not present

## 2013-10-29 DIAGNOSIS — R509 Fever, unspecified: Secondary | ICD-10-CM | POA: Insufficient documentation

## 2013-10-29 DIAGNOSIS — R110 Nausea: Secondary | ICD-10-CM | POA: Diagnosis not present

## 2013-10-29 LAB — CBC WITH DIFFERENTIAL/PLATELET
Basophils Absolute: 0 10*3/uL (ref 0.0–0.1)
Basophils Relative: 1 % (ref 0–1)
EOS ABS: 0 10*3/uL (ref 0.0–0.7)
Eosinophils Relative: 1 % (ref 0–5)
HCT: 39 % (ref 36.0–46.0)
Hemoglobin: 12.6 g/dL (ref 12.0–15.0)
LYMPHS ABS: 1.1 10*3/uL (ref 0.7–4.0)
Lymphocytes Relative: 26 % (ref 12–46)
MCH: 25.7 pg — AB (ref 26.0–34.0)
MCHC: 32.3 g/dL (ref 30.0–36.0)
MCV: 79.4 fL (ref 78.0–100.0)
Monocytes Absolute: 0.5 10*3/uL (ref 0.1–1.0)
Monocytes Relative: 12 % (ref 3–12)
NEUTROS PCT: 61 % (ref 43–77)
Neutro Abs: 2.7 10*3/uL (ref 1.7–7.7)
PLATELETS: 267 10*3/uL (ref 150–400)
RBC: 4.91 MIL/uL (ref 3.87–5.11)
RDW: 21 % — ABNORMAL HIGH (ref 11.5–15.5)
WBC: 4.4 10*3/uL (ref 4.0–10.5)

## 2013-10-29 LAB — URINALYSIS, ROUTINE W REFLEX MICROSCOPIC
Bilirubin Urine: NEGATIVE
Glucose, UA: NEGATIVE mg/dL
Ketones, ur: NEGATIVE mg/dL
LEUKOCYTES UA: NEGATIVE
Nitrite: NEGATIVE
PROTEIN: NEGATIVE mg/dL
Specific Gravity, Urine: 1.019 (ref 1.005–1.030)
UROBILINOGEN UA: 0.2 mg/dL (ref 0.0–1.0)
pH: 6 (ref 5.0–8.0)

## 2013-10-29 LAB — URINE MICROSCOPIC-ADD ON

## 2013-10-29 LAB — COMPREHENSIVE METABOLIC PANEL
ALT: 48 U/L — AB (ref 0–35)
ANION GAP: 12 (ref 5–15)
AST: 54 U/L — ABNORMAL HIGH (ref 0–37)
Albumin: 3.4 g/dL — ABNORMAL LOW (ref 3.5–5.2)
Alkaline Phosphatase: 73 U/L (ref 39–117)
BUN: 15 mg/dL (ref 6–23)
CALCIUM: 8.9 mg/dL (ref 8.4–10.5)
CO2: 21 mEq/L (ref 19–32)
Chloride: 99 mEq/L (ref 96–112)
Creatinine, Ser: 1.16 mg/dL — ABNORMAL HIGH (ref 0.50–1.10)
GFR calc non Af Amer: 46 mL/min — ABNORMAL LOW (ref >90)
GFR, EST AFRICAN AMERICAN: 53 mL/min — AB (ref >90)
GLUCOSE: 101 mg/dL — AB (ref 70–99)
POTASSIUM: 4.8 meq/L (ref 3.7–5.3)
SODIUM: 132 meq/L — AB (ref 137–147)
Total Bilirubin: 0.2 mg/dL — ABNORMAL LOW (ref 0.3–1.2)
Total Protein: 7.6 g/dL (ref 6.0–8.3)

## 2013-10-29 LAB — LIPASE, BLOOD: Lipase: 81 U/L — ABNORMAL HIGH (ref 11–59)

## 2013-10-29 LAB — PRO B NATRIURETIC PEPTIDE: Pro B Natriuretic peptide (BNP): 366.6 pg/mL — ABNORMAL HIGH (ref 0–125)

## 2013-10-29 LAB — DIGOXIN LEVEL: Digoxin Level: 0.7 ng/mL — ABNORMAL LOW (ref 0.8–2.0)

## 2013-10-29 MED ORDER — SODIUM CHLORIDE 0.9 % IV BOLUS (SEPSIS)
500.0000 mL | Freq: Once | INTRAVENOUS | Status: AC
  Administered 2013-10-29: 500 mL via INTRAVENOUS

## 2013-10-29 MED ORDER — DOXYCYCLINE HYCLATE 100 MG PO CAPS
100.0000 mg | ORAL_CAPSULE | Freq: Two times a day (BID) | ORAL | Status: AC
Start: 2013-10-29 — End: 2013-11-05

## 2013-10-29 NOTE — ED Notes (Signed)
Main lab states that they will add on digoxin.

## 2013-10-29 NOTE — ED Provider Notes (Signed)
CSN: [id number]     Arrival date & time 10/29/13  1745 History   First MD Initiated Contact with Patient 10/29/13 1920     Chief Complaint  Patient presents with  . Fever  . Nausea     (Consider location/radiation/quality/duration/timing/severity/associated sxs/prior Treatment) Patient is a 73 y.o. female presenting with fever.  Fever Max temp prior to arrival:  103 Temp source:  Oral Severity:  Moderate Onset quality:  Gradual Duration: 1 month. Timing:  Intermittent Progression:  Waxing and waning Chronicity:  New Relieved by:  Acetaminophen and ibuprofen Worsened by:  Nothing tried Ineffective treatments:  None tried Associated symptoms: cough and sore throat   Associated symptoms: no chest pain, no chills, no congestion, no diarrhea, no dysuria, no headaches, no myalgias, no nausea, no rash, no rhinorrhea and no vomiting   Risk factors: no recent travel and no sick contacts     Past Medical History  Diagnosis Date  . Esophageal reflux   . Rheumatoid arthritis 1975  . Osteopenia   . Polymorphic light eruption 2004  . Hyperlipidemia   . Adhesive capsulitis of left shoulder 1980  . Right middle ear infection 1986  . Acne rosacea   . Allergic rhinitis   . Hypercholesterolemia   . Atrial fibrillation   . Hypertension   . Edema    Past Surgical History  Procedure Laterality Date  . Cardiac catheterization    . Cardioversion     Family History  Problem Relation Age of Onset  . Heart disease Father    History  Substance Use Topics  . Smoking status: Never Smoker   . Smokeless tobacco: Never Used  . Alcohol Use: No   OB History   Grav Para Term Preterm Abortions TAB SAB Ect Mult Living                 Review of Systems  Constitutional: Positive for fever. Negative for chills.  HENT: Positive for sore throat. Negative for congestion and rhinorrhea.   Eyes: Negative for visual disturbance.  Respiratory: Positive for cough and shortness of breath.  Negative for wheezing.   Cardiovascular: Negative for chest pain.  Gastrointestinal: Negative for nausea, vomiting, abdominal pain, diarrhea and constipation.  Genitourinary: Negative for dysuria, difficulty urinating and vaginal pain.  Musculoskeletal: Positive for arthralgias. Negative for myalgias.  Skin: Negative for rash.  Neurological: Negative for syncope and headaches.  Psychiatric/Behavioral: Negative for behavioral problems.  All other systems reviewed and are negative.     Allergies  Arthrotec; Morphine sulfate; Amoxicillin; Gold bond; Macrodantin; Naproxen; and Penicillins  Home Medications   Prior to Admission medications   Medication Sig Start Date End Date Taking? Authorizing Provider  benzonatate (TESSALON) 100 MG capsule Take 100 mg by mouth 3 (three) times daily as needed for cough.   Yes Historical Provider, MD  Calcium Carbonate-Vitamin D (CALCIUM + D PO) Take 1,000 mg by mouth daily.    Yes Historical Provider, MD  cholecalciferol (VITAMIN D) 1000 UNITS tablet Take 2,000 Units by mouth daily.   Yes Historical Provider, MD  digoxin (LANOXIN) 0.125 MG tablet TAKE 1 TABLET (0.125 MG TOTAL) BY MOUTH DAILY. 09/09/13  Yes Jettie Booze, MD  etanercept (ENBREL) 50 MG/ML injection Inject 50 mg into the skin once a week.   Yes Historical Provider, MD  fluticasone (FLONASE) 50 MCG/ACT nasal spray Place 2 sprays into both nostrils daily as needed for allergies or rhinitis.    Yes Historical Provider, MD  folic acid (FOLVITE)  1 MG tablet Take 2 mg by mouth daily.    Yes Historical Provider, MD  KLOR-CON M20 20 MEQ tablet Take 1 tablet by mouth daily. 10/13/13  Yes Historical Provider, MD  meclizine (ANTIVERT) 25 MG tablet Take 1 tablet by mouth. 10/20/13  Yes Historical Provider, MD  methotrexate 2.5 MG tablet Take by mouth 2 (two) times a week. 3 tablets on on tuesdays 3 tablets on wednesdays   Yes Historical Provider, MD  metoprolol succinate (TOPROL-XL) 100 MG 24 hr  tablet Take 1 tablet (100 mg total) by mouth daily. Take with or immediately following a meal. 04/01/13  Yes Jettie Booze, MD  Multiple Vitamins-Minerals (MULTIVITAMIN WITH MINERALS) tablet Take 1 tablet by mouth daily.   Yes Historical Provider, MD  omeprazole (PRILOSEC) 20 MG capsule Take 20 mg by mouth daily.   Yes Historical Provider, MD  predniSONE (DELTASONE) 5 MG tablet Take 2.5 mg by mouth daily with breakfast.    Yes Historical Provider, MD  sulfamethoxazole-trimethoprim (BACTRIM DS) 800-160 MG per tablet Take 1 tablet by mouth 2 (two) times daily. 10/20/13  Yes Historical Provider, MD  verapamil (CALAN-SR) 120 MG CR tablet Take 120 mg by mouth daily. With food   Yes Historical Provider, MD  warfarin (COUMADIN) 5 MG tablet Take 1 tablet by mouth daily or as directed 06/10/13  Yes Jettie Booze, MD  doxycycline (VIBRAMYCIN) 100 MG capsule Take 1 capsule (100 mg total) by mouth 2 (two) times daily. 10/29/13 11/05/13  Renne Musca, MD   BP 109/82  Pulse 81  Temp(Src) 99.2 F (37.3 C) (Oral)  Resp 20  SpO2 96% Physical Exam  Vitals reviewed. Constitutional: She is oriented to person, place, and time. She appears well-developed and well-nourished. No distress.  HENT:  Head: Normocephalic and atraumatic.  Eyes: EOM are normal.  Neck: Normal range of motion.  Cardiovascular: Normal rate, regular rhythm and normal heart sounds.   No murmur heard. Pulmonary/Chest: Effort normal and breath sounds normal. No respiratory distress. She has no wheezes. She has no rales. She exhibits no tenderness.  Abdominal: Soft. There is no tenderness.  Musculoskeletal: She exhibits no edema.  Neurological: She is alert and oriented to person, place, and time.  Skin: She is not diaphoretic.  Psychiatric: She has a normal mood and affect. Her behavior is normal.    ED Course  Procedures (including critical care time) Labs Review Labs Reviewed  CBC WITH DIFFERENTIAL - Abnormal; Notable for  the following:    MCH 25.7 (*)    RDW 21.0 (*)    All other components within normal limits  COMPREHENSIVE METABOLIC PANEL - Abnormal; Notable for the following:    Sodium 132 (*)    Glucose, Bld 101 (*)    Creatinine, Ser 1.16 (*)    Albumin 3.4 (*)    AST 54 (*)    ALT 48 (*)    Total Bilirubin 0.2 (*)    GFR calc non Af Amer 46 (*)    GFR calc Af Amer 53 (*)    All other components within normal limits  LIPASE, BLOOD - Abnormal; Notable for the following:    Lipase 81 (*)    All other components within normal limits  URINALYSIS, ROUTINE W REFLEX MICROSCOPIC - Abnormal; Notable for the following:    Hgb urine dipstick TRACE (*)    All other components within normal limits  PRO B NATRIURETIC PEPTIDE - Abnormal; Notable for the following:    Pro B Natriuretic  peptide (BNP) 366.6 (*)    All other components within normal limits  DIGOXIN LEVEL - Abnormal; Notable for the following:    Digoxin Level 0.7 (*)    All other components within normal limits  URINE MICROSCOPIC-ADD ON    Imaging Review Dg Chest 2 View  10/29/2013   CLINICAL DATA:  Chest pain with cough and fever  EXAM: CHEST  2 VIEW  COMPARISON:  10/20/2013  FINDINGS: The heart size and mediastinal contours are within normal limits. Both lungs are clear. The visualized skeletal structures are unremarkable.  IMPRESSION: No active cardiopulmonary disease.   Electronically Signed   By: Kathreen Devoid   On: 10/29/2013 20:54     EKG Interpretation None      MDM   Final diagnoses:  Febrile illness  Cough     Patient is a 73 year old female that presents with one month of intermittent fever, cough, sore throat. Patient has been seen by her PCP and prescribed Levaquin for 2 weeks. Fever and cough persisted since then she was switched to Bactrim. Patient is on day 9 of 10 of the antibiotic. Patient went back to PCP and additional testing was done yesterday. Patient was called this morning and told to come to the ED given  that she had elevated blood markers for Lyme disease and possible kidney trouble. On route to the ED the patient is afebrile with stable vital signs. On exam the patient is mildly orthostatic.  We'll give 500 cc normal saline bolus. Patient's chest x-ray and labs are unremarkable. Patient denies being [place] however did remove a tick from her dog not long ago. It is unlikely the patient has Lyme disease given no exposure to be endemic area. However with a tick exposure we will treat the patient with doxycycline and have her followup with her PCP to reassess renal function. Of note patient's creatinine was 1.1 and her last creatinine in May with 0.9. Patient given the plan and she agrees to follow up with her PCP. Patient given return precautions with her family was understanding to.    Renne Musca, MD 10/30/13 [phone]

## 2013-10-29 NOTE — ED Notes (Signed)
MD at bedside. 

## 2013-10-29 NOTE — ED Provider Notes (Signed)
Patient seen/examined in the Emergency Department in conjunction with Resident Physician Provider Robina Ade Patient reports fever/chills for several weeks with recent workup by PCP Exam : awake/alert, no distress.   Plan: will rehydrate.  She has had extensive workup as outpatient by her PCP with possible Lyme disease.  Will initiate doxycycline   Sharyon Cable, MD 10/29/13 2202

## 2013-10-29 NOTE — ED Notes (Addendum)
Pt reports being sent here from pcp for further eval due to fever, fatigue and not feeling well x 4 weeks. Had multiple blood tests done yesterday and they called pt and told her to come in due to possible lyme disease and decreased renal function. Has taken antibiotics x 2 with no relief.

## 2013-10-29 NOTE — Discharge Instructions (Signed)

## 2013-10-29 NOTE — ED Notes (Signed)
Spoke with main lab - they will add on BNP.

## 2013-10-29 NOTE — ED Notes (Signed)
Attempted IV x2, unsuccessful. Second RN will attempt.

## 2013-10-30 NOTE — ED Provider Notes (Signed)
I have personally seen and examined the patient.  I have discussed the plan of care with the resident.  I have reviewed the documentation on PMH/FH/Soc. History.  I have reviewed the documentation of the resident and agree.   Sharyon Cable, MD 10/30/13 [phone]

## 2013-11-07 ENCOUNTER — Ambulatory Visit (INDEPENDENT_AMBULATORY_CARE_PROVIDER_SITE_OTHER): Payer: Medicare Other

## 2013-11-07 DIAGNOSIS — Z5181 Encounter for therapeutic drug level monitoring: Secondary | ICD-10-CM

## 2013-11-07 DIAGNOSIS — Z7901 Long term (current) use of anticoagulants: Secondary | ICD-10-CM

## 2013-11-07 DIAGNOSIS — I4891 Unspecified atrial fibrillation: Secondary | ICD-10-CM

## 2013-11-07 LAB — POCT INR: INR: 2.3

## 2013-11-11 ENCOUNTER — Other Ambulatory Visit: Payer: Self-pay | Admitting: Pharmacist

## 2013-11-11 DIAGNOSIS — E785 Hyperlipidemia, unspecified: Secondary | ICD-10-CM

## 2013-11-11 DIAGNOSIS — Z5181 Encounter for therapeutic drug level monitoring: Secondary | ICD-10-CM

## 2013-11-20 ENCOUNTER — Encounter: Payer: Self-pay | Admitting: Interventional Cardiology

## 2013-11-28 ENCOUNTER — Other Ambulatory Visit: Payer: Medicare Other

## 2013-11-28 ENCOUNTER — Ambulatory Visit (INDEPENDENT_AMBULATORY_CARE_PROVIDER_SITE_OTHER): Payer: Medicare Other | Admitting: *Deleted

## 2013-11-28 DIAGNOSIS — Z5181 Encounter for therapeutic drug level monitoring: Secondary | ICD-10-CM

## 2013-11-28 DIAGNOSIS — Z7901 Long term (current) use of anticoagulants: Secondary | ICD-10-CM

## 2013-11-28 DIAGNOSIS — I4891 Unspecified atrial fibrillation: Secondary | ICD-10-CM

## 2013-11-28 LAB — POCT INR: INR: 3.4

## 2013-12-01 ENCOUNTER — Encounter: Payer: Self-pay | Admitting: *Deleted

## 2013-12-01 ENCOUNTER — Other Ambulatory Visit: Payer: Self-pay | Admitting: *Deleted

## 2013-12-01 MED ORDER — FUROSEMIDE 40 MG PO TABS: 40.0000 mg | ORAL_TABLET | Freq: Every day | ORAL | Status: DC

## 2013-12-01 NOTE — Telephone Encounter (Signed)
Called patient to verify if taking Furosemide, mg, and how often. Patient verified she is taking Furosemide 40mg , once daily. Was told to hold but recently put back on Furosemide once daily from her previous 2 tabs Monday, Tuesday, Wednesday andn 1 tab Thursday, Friday, Saturday, Sunday.

## 2013-12-10 ENCOUNTER — Ambulatory Visit (INDEPENDENT_AMBULATORY_CARE_PROVIDER_SITE_OTHER): Payer: Medicare Other | Admitting: *Deleted

## 2013-12-10 DIAGNOSIS — Z5181 Encounter for therapeutic drug level monitoring: Secondary | ICD-10-CM

## 2013-12-10 DIAGNOSIS — I4891 Unspecified atrial fibrillation: Secondary | ICD-10-CM

## 2013-12-10 DIAGNOSIS — Z7901 Long term (current) use of anticoagulants: Secondary | ICD-10-CM

## 2013-12-10 LAB — POCT INR: INR: 3.9

## 2013-12-10 NOTE — Telephone Encounter (Signed)
Ok to refill lasix 40 mg daily

## 2013-12-24 ENCOUNTER — Ambulatory Visit (INDEPENDENT_AMBULATORY_CARE_PROVIDER_SITE_OTHER): Payer: Medicare Other | Admitting: *Deleted

## 2013-12-24 DIAGNOSIS — Z5181 Encounter for therapeutic drug level monitoring: Secondary | ICD-10-CM

## 2013-12-24 DIAGNOSIS — Z7901 Long term (current) use of anticoagulants: Secondary | ICD-10-CM

## 2013-12-24 DIAGNOSIS — I4891 Unspecified atrial fibrillation: Secondary | ICD-10-CM

## 2013-12-24 LAB — POCT INR: INR: 2.9

## 2014-01-14 ENCOUNTER — Ambulatory Visit (INDEPENDENT_AMBULATORY_CARE_PROVIDER_SITE_OTHER): Payer: Medicare Other | Admitting: Surgery

## 2014-01-14 DIAGNOSIS — Z5181 Encounter for therapeutic drug level monitoring: Secondary | ICD-10-CM

## 2014-01-14 DIAGNOSIS — Z7901 Long term (current) use of anticoagulants: Secondary | ICD-10-CM

## 2014-01-14 DIAGNOSIS — I4891 Unspecified atrial fibrillation: Secondary | ICD-10-CM

## 2014-01-14 LAB — POCT INR: INR: 2.4

## 2014-01-15 ENCOUNTER — Other Ambulatory Visit: Payer: Self-pay | Admitting: Surgery

## 2014-01-15 MED ORDER — WARFARIN SODIUM 5 MG PO TABS: ORAL_TABLET | ORAL | Status: DC

## 2014-02-09 ENCOUNTER — Other Ambulatory Visit: Payer: Self-pay | Admitting: Interventional Cardiology

## 2014-02-10 ENCOUNTER — Other Ambulatory Visit: Payer: Self-pay | Admitting: Interventional Cardiology

## 2014-02-11 ENCOUNTER — Ambulatory Visit (INDEPENDENT_AMBULATORY_CARE_PROVIDER_SITE_OTHER): Payer: Medicare Other | Admitting: *Deleted

## 2014-02-11 DIAGNOSIS — Z5181 Encounter for therapeutic drug level monitoring: Secondary | ICD-10-CM

## 2014-02-11 DIAGNOSIS — I4891 Unspecified atrial fibrillation: Secondary | ICD-10-CM

## 2014-02-11 DIAGNOSIS — Z7901 Long term (current) use of anticoagulants: Secondary | ICD-10-CM

## 2014-02-11 LAB — POCT INR: INR: 2.5

## 2014-03-11 ENCOUNTER — Ambulatory Visit (INDEPENDENT_AMBULATORY_CARE_PROVIDER_SITE_OTHER): Payer: Medicare Other | Admitting: Pharmacist

## 2014-03-11 DIAGNOSIS — Z5181 Encounter for therapeutic drug level monitoring: Secondary | ICD-10-CM

## 2014-03-11 DIAGNOSIS — Z7901 Long term (current) use of anticoagulants: Secondary | ICD-10-CM

## 2014-03-11 DIAGNOSIS — I4891 Unspecified atrial fibrillation: Secondary | ICD-10-CM

## 2014-03-11 LAB — POCT INR: INR: 1.9

## 2014-04-08 ENCOUNTER — Ambulatory Visit (INDEPENDENT_AMBULATORY_CARE_PROVIDER_SITE_OTHER): Payer: Medicare Other | Admitting: *Deleted

## 2014-04-08 DIAGNOSIS — Z5181 Encounter for therapeutic drug level monitoring: Secondary | ICD-10-CM

## 2014-04-08 DIAGNOSIS — I4891 Unspecified atrial fibrillation: Secondary | ICD-10-CM | POA: Diagnosis not present

## 2014-04-08 DIAGNOSIS — Z7901 Long term (current) use of anticoagulants: Secondary | ICD-10-CM

## 2014-04-08 LAB — POCT INR: INR: 2.1

## 2014-04-15 ENCOUNTER — Other Ambulatory Visit: Payer: Self-pay | Admitting: Interventional Cardiology

## 2014-05-07 ENCOUNTER — Ambulatory Visit (INDEPENDENT_AMBULATORY_CARE_PROVIDER_SITE_OTHER): Payer: Medicare Other

## 2014-05-07 DIAGNOSIS — I4891 Unspecified atrial fibrillation: Secondary | ICD-10-CM

## 2014-05-07 DIAGNOSIS — Z5181 Encounter for therapeutic drug level monitoring: Secondary | ICD-10-CM

## 2014-05-07 DIAGNOSIS — Z7901 Long term (current) use of anticoagulants: Secondary | ICD-10-CM

## 2014-05-07 LAB — POCT INR: INR: 2.2

## 2014-05-14 ENCOUNTER — Other Ambulatory Visit: Payer: Self-pay | Admitting: Interventional Cardiology

## 2014-05-15 NOTE — Telephone Encounter (Signed)
Please clarify what patients current therapy should be. 05/19/13 ov documentation says 4. Ankle edema  Continue Lasix Tablet, 40 MG, 2 tab Mon, Tues and Weds and 1 tablet all other days, Orally, Once a day. Thanks, MI

## 2014-05-25 ENCOUNTER — Ambulatory Visit (INDEPENDENT_AMBULATORY_CARE_PROVIDER_SITE_OTHER): Payer: Medicare Other | Admitting: Interventional Cardiology

## 2014-05-25 ENCOUNTER — Encounter: Payer: Self-pay | Admitting: Interventional Cardiology

## 2014-05-25 VITALS — BP 120/68 | HR 66 | Ht 64.0 in | Wt 159.0 lb

## 2014-05-25 DIAGNOSIS — I10 Essential (primary) hypertension: Secondary | ICD-10-CM

## 2014-05-25 DIAGNOSIS — I4891 Unspecified atrial fibrillation: Secondary | ICD-10-CM

## 2014-05-25 DIAGNOSIS — M25473 Effusion, unspecified ankle: Secondary | ICD-10-CM

## 2014-05-25 DIAGNOSIS — E785 Hyperlipidemia, unspecified: Secondary | ICD-10-CM | POA: Diagnosis not present

## 2014-05-25 DIAGNOSIS — R609 Edema, unspecified: Secondary | ICD-10-CM | POA: Diagnosis not present

## 2014-05-25 DIAGNOSIS — Z7901 Long term (current) use of anticoagulants: Secondary | ICD-10-CM

## 2014-05-25 NOTE — Progress Notes (Signed)
Patient ID: [name], female   DOB: 1941/01/15, 74 y.o.   MRN: [id number] Patient ID: [name], female   DOB: October 20, 1940, 74 y.o.   MRN: [id number]    [place], [address] [place], [place]  [address] Phone: [phone] Fax:  [phone]  Date:  05/25/2014   ID:  [name], [name] 01-Jun-1940, MRN [id number]  PCP:  Lottie Dawson, MD      History of Present Illness: [name] is a 74 y.o. female who has had AFib. She has had some nosebleeds. She had her left nose cauterized, twice.  She has had much fewer nosebleeds since then, but they are not completely gone. She can get them to stop with compression. Had to have mouth cauterized in the past. Atrial Fibrillation F/U:  Denies : Chest pain.  Dizziness.  Leg edema.  Orthopnea.  Syncope.  She has some ankle pain which limits walking.  She stays active.    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  SHe had a lot of financial stress.  Her husband was spending and borrowing money that she did not know about.  She nearly lost her home. She has had some ankle edema.  Once the stress started, that is when the AFib returned.  She thinks it started in July 2015, and has been persistent.    Wt Readings from Last 3 Encounters:  05/25/14 159 lb (72.122 kg)  05/19/13 162 lb (73.483 kg)     Past Medical History  Diagnosis Date  . Esophageal reflux   . Rheumatoid arthritis 1975  . Osteopenia   . Polymorphic light eruption 2004  . Hyperlipidemia   . Adhesive capsulitis of left shoulder 1980  . Right middle ear infection 1986  . Acne rosacea   . Allergic rhinitis   . Hypercholesterolemia   . Atrial fibrillation   . Hypertension   . Edema     Current Outpatient Prescriptions  Medication Sig Dispense Refill  . Calcium Carbonate-Vitamin D (CALCIUM + D PO) Take 1,000 mg by mouth daily.     . cholecalciferol (VITAMIN D) 1000 UNITS tablet Take 2,000 Units by mouth daily.    . digoxin  (LANOXIN) 0.125 MG tablet TAKE 1 TABLET (0.125 MG TOTAL) BY MOUTH DAILY. 30 tablet 4  . etanercept (ENBREL) 50 MG/ML injection Inject 50 mg into the skin once a week.    . fluticasone (FLONASE) 50 MCG/ACT nasal spray Place 2 sprays into both nostrils daily as needed for allergies or rhinitis.     . folic acid (FOLVITE) 1 MG tablet Take 2 mg by mouth daily.     . furosemide (LASIX) 40 MG tablet 1 tablet by mouth daily or as directed 30 tablet 5  . meclizine (ANTIVERT) 25 MG tablet Take 25 mg by mouth daily as needed for dizziness.    . methotrexate (RHEUMATREX) 2.5 MG tablet Take 2 tablets by mouth on Tuesday and Wednesday each week.    . metoprolol succinate (TOPROL-XL) 100 MG 24 hr tablet TAKE 1 TABLET BY MOUTH EVERY DAY WITH OR IMMEDIATELY FOLLOWING A MEAL. 90 tablet 0  . Multiple Vitamins-Minerals (MULTIVITAMIN WITH MINERALS) tablet Take 1 tablet by mouth daily.    Marland Kitchen omeprazole (PRILOSEC) 20 MG capsule Take 20 mg by mouth daily.    . pravastatin (PRAVACHOL) 20 MG tablet Take 20 mg by mouth daily.   10  . predniSONE (DELTASONE) 5 MG tablet Take 2.5 mg by  mouth daily with breakfast.     . verapamil (CALAN-SR) 120 MG CR tablet TAKE 1 TABLET BY MOUTH EVERY MORNING WITH FOOD 30 tablet 5  . warfarin (COUMADIN) 5 MG tablet Take 1 tablet by mouth daily or as directed 30 tablet 3   No current facility-administered medications for this visit.    Allergies:    Allergies  Allergen Reactions  . Arthrotec [Diclofenac-Misoprostol] Diarrhea  . Morphine Sulfate   . Amoxicillin Rash  . Gold Bond [Menthol-Zinc Oxide] Rash  . Macrodantin [Nitrofurantoin Macrocrystal] Rash  . Naproxen Rash  . Penicillins Rash    Social History:  The patient  reports that she has never smoked. She has never used smokeless tobacco. She reports that she does not drink alcohol or use illicit drugs.   Family History:  The patient's family history includes Heart disease in her father.   ROS:  Please see the history of  present illness.  No nausea, vomiting.  No fevers, chills.  No focal weakness.  No dysuria.    All other systems reviewed and negative.   PHYSICAL EXAM: VS:  BP 120/68 mmHg  Pulse 66  Ht 5\' 4"  (1.626 m)  Wt 159 lb (72.122 kg)  BMI 27.28 kg/m2 Well nourished, well developed, in no acute distress HEENT: normal Neck: no JVD, no carotid bruits Cardiac:  Irregularly irregular, normal rate;  Lungs:  clear to auscultation bilaterally, no wheezing, rhonchi or rales Abd: soft, nontender, no hepatomegaly Ext: no edema Skin: warm and dry Neuro:   no focal abnormalities noted Psych: normal affect  EKG:  AFib, rate controlled, NSST  ASSESSMENT AND PLAN:   Atrial fibrillation     No longer in NSR.     Notes: Chronic AFib,, rate controlled.  COumadin for stroke prevention. Doubt cardioversion would work since it is been some many months since she has been back in atrial fibrillation. She is not having many symptoms of palpitations. I don't think it is worth the risk of an antiarrhythmic to try to maintain sinus rhythm. Continue with rate control and anticoagulation.    2. Essential hypertension, benign  Notes: Controlled at home.    3. Encounter for long-term (current) use of high-risk medication  Notes: nose bleeds are decreasing in frequency.  Getting more freqent checks recently.  Stressed imprtance of coumadin for stroke prevention.  Consider changing to NOAC if monitoring is a problem.    4. Ankle edema  Continue Lasix Tablet, 40 MG, 2 tab Mon, Tues and Weds and 1 tablet all other days, Orally, Once a day Notes: Elevate legs. Could consider compresion stockings. Worse since she has returned to work.  Labs checked with Dr. Trudie Reed.  She had some hypokalemia that is being managed with dietary changes.   5. Hyperlipidemia: Continue pravastatin.  LDL 78.  LDL 88 in 2/16. Preventive Medicine  Adult topics discussed:  Diet: healthy diet, low calorie, low fat.  Exercise: 5 days a week, at  least 30 minutes of aerobic exercise.  Getting exercise with her work.      Signed, Mina Marble, MD, [facility] 05/25/2014 3:38 PM

## 2014-05-25 NOTE — Patient Instructions (Signed)
**Note De-Identified  Obfuscation** Medication Instructions:  Same. Please contact us at [phone] if your Coumadin checks become too often.  Labwork: None  Testing/Procedures: None  Follow-Up: Your physician wants you to follow-up in: 1 year. You will receive a reminder letter in the mail two months in advance. If you don't receive a letter, please call our office to schedule the follow-up appointment.

## 2014-06-05 ENCOUNTER — Ambulatory Visit (INDEPENDENT_AMBULATORY_CARE_PROVIDER_SITE_OTHER): Payer: Medicare Other | Admitting: *Deleted

## 2014-06-05 DIAGNOSIS — I4891 Unspecified atrial fibrillation: Secondary | ICD-10-CM | POA: Diagnosis not present

## 2014-06-05 DIAGNOSIS — Z5181 Encounter for therapeutic drug level monitoring: Secondary | ICD-10-CM | POA: Diagnosis not present

## 2014-06-05 DIAGNOSIS — Z7901 Long term (current) use of anticoagulants: Secondary | ICD-10-CM

## 2014-06-05 LAB — POCT INR: INR: 2.9

## 2014-06-10 ENCOUNTER — Other Ambulatory Visit: Payer: Self-pay | Admitting: *Deleted

## 2014-06-10 MED ORDER — WARFARIN SODIUM 5 MG PO TABS: ORAL_TABLET | ORAL | Status: DC

## 2014-06-11 ENCOUNTER — Other Ambulatory Visit: Payer: Self-pay | Admitting: Interventional Cardiology

## 2014-07-07 ENCOUNTER — Other Ambulatory Visit: Payer: Self-pay | Admitting: Interventional Cardiology

## 2014-07-17 ENCOUNTER — Ambulatory Visit (INDEPENDENT_AMBULATORY_CARE_PROVIDER_SITE_OTHER): Payer: Medicare Other | Admitting: *Deleted

## 2014-07-17 DIAGNOSIS — Z5181 Encounter for therapeutic drug level monitoring: Secondary | ICD-10-CM

## 2014-07-17 DIAGNOSIS — Z7901 Long term (current) use of anticoagulants: Secondary | ICD-10-CM | POA: Diagnosis not present

## 2014-07-17 DIAGNOSIS — I4891 Unspecified atrial fibrillation: Secondary | ICD-10-CM

## 2014-07-17 LAB — POCT INR: INR: 2.4

## 2014-08-03 ENCOUNTER — Emergency Department (HOSPITAL_COMMUNITY): Payer: Medicare Other

## 2014-08-03 ENCOUNTER — Encounter (HOSPITAL_COMMUNITY): Payer: Self-pay | Admitting: Emergency Medicine

## 2014-08-03 ENCOUNTER — Inpatient Hospital Stay (HOSPITAL_COMMUNITY)
Admission: EM | Admit: 2014-08-03 | Discharge: 2014-08-06 | DRG: 194 | Disposition: A | Discharge status: Home Health | Payer: Medicare Other | Attending: Family Medicine | Admitting: Family Medicine

## 2014-08-03 DIAGNOSIS — K219 Gastro-esophageal reflux disease without esophagitis: Secondary | ICD-10-CM | POA: Diagnosis present

## 2014-08-03 DIAGNOSIS — R0602 Shortness of breath: Secondary | ICD-10-CM | POA: Diagnosis not present

## 2014-08-03 DIAGNOSIS — Z880 Allergy status to penicillin: Secondary | ICD-10-CM

## 2014-08-03 DIAGNOSIS — J189 Pneumonia, unspecified organism: Secondary | ICD-10-CM | POA: Diagnosis not present

## 2014-08-03 DIAGNOSIS — Z886 Allergy status to analgesic agent status: Secondary | ICD-10-CM

## 2014-08-03 DIAGNOSIS — M25512 Pain in left shoulder: Secondary | ICD-10-CM | POA: Diagnosis present

## 2014-08-03 DIAGNOSIS — J9811 Atelectasis: Secondary | ICD-10-CM | POA: Diagnosis present

## 2014-08-03 DIAGNOSIS — J90 Pleural effusion, not elsewhere classified: Secondary | ICD-10-CM | POA: Diagnosis present

## 2014-08-03 DIAGNOSIS — E785 Hyperlipidemia, unspecified: Secondary | ICD-10-CM | POA: Diagnosis present

## 2014-08-03 DIAGNOSIS — I10 Essential (primary) hypertension: Secondary | ICD-10-CM | POA: Diagnosis present

## 2014-08-03 DIAGNOSIS — M8580 Other specified disorders of bone density and structure, unspecified site: Secondary | ICD-10-CM | POA: Diagnosis present

## 2014-08-03 DIAGNOSIS — R61 Generalized hyperhidrosis: Secondary | ICD-10-CM | POA: Diagnosis present

## 2014-08-03 DIAGNOSIS — E876 Hypokalemia: Secondary | ICD-10-CM | POA: Diagnosis present

## 2014-08-03 DIAGNOSIS — R0789 Other chest pain: Secondary | ICD-10-CM | POA: Diagnosis present

## 2014-08-03 DIAGNOSIS — Z8249 Family history of ischemic heart disease and other diseases of the circulatory system: Secondary | ICD-10-CM

## 2014-08-03 DIAGNOSIS — Z885 Allergy status to narcotic agent status: Secondary | ICD-10-CM

## 2014-08-03 DIAGNOSIS — I481 Persistent atrial fibrillation: Secondary | ICD-10-CM | POA: Diagnosis not present

## 2014-08-03 DIAGNOSIS — Z7901 Long term (current) use of anticoagulants: Secondary | ICD-10-CM

## 2014-08-03 DIAGNOSIS — R079 Chest pain, unspecified: Secondary | ICD-10-CM

## 2014-08-03 DIAGNOSIS — M069 Rheumatoid arthritis, unspecified: Secondary | ICD-10-CM | POA: Diagnosis present

## 2014-08-03 DIAGNOSIS — I482 Chronic atrial fibrillation: Secondary | ICD-10-CM | POA: Diagnosis present

## 2014-08-03 DIAGNOSIS — Z79899 Other long term (current) drug therapy: Secondary | ICD-10-CM

## 2014-08-03 DIAGNOSIS — E780 Pure hypercholesterolemia: Secondary | ICD-10-CM | POA: Diagnosis present

## 2014-08-03 DIAGNOSIS — I959 Hypotension, unspecified: Secondary | ICD-10-CM | POA: Diagnosis present

## 2014-08-03 DIAGNOSIS — N951 Menopausal and female climacteric states: Secondary | ICD-10-CM | POA: Diagnosis present

## 2014-08-03 DIAGNOSIS — I313 Pericardial effusion (noninflammatory): Secondary | ICD-10-CM | POA: Diagnosis present

## 2014-08-03 DIAGNOSIS — R0989 Other specified symptoms and signs involving the circulatory and respiratory systems: Secondary | ICD-10-CM

## 2014-08-03 DIAGNOSIS — Z7952 Long term (current) use of systemic steroids: Secondary | ICD-10-CM

## 2014-08-03 DIAGNOSIS — M549 Dorsalgia, unspecified: Secondary | ICD-10-CM | POA: Diagnosis present

## 2014-08-03 LAB — I-STAT TROPONIN, ED: TROPONIN I, POC: 0 ng/mL (ref 0.00–0.08)

## 2014-08-03 LAB — CBC
HCT: 42.3 % (ref 36.0–46.0)
Hemoglobin: 13.6 g/dL (ref 12.0–15.0)
MCH: 30.6 pg (ref 26.0–34.0)
MCHC: 32.2 g/dL (ref 30.0–36.0)
MCV: 95.3 fL (ref 78.0–100.0)
PLATELETS: 240 10*3/uL (ref 150–400)
RBC: 4.44 MIL/uL (ref 3.87–5.11)
RDW: 16.9 % — AB (ref 11.5–15.5)
WBC: 10.4 10*3/uL (ref 4.0–10.5)

## 2014-08-03 LAB — BASIC METABOLIC PANEL
ANION GAP: 9 (ref 5–15)
BUN: 10 mg/dL (ref 6–20)
CO2: 28 mmol/L (ref 22–32)
Calcium: 9.8 mg/dL (ref 8.9–10.3)
Chloride: 105 mmol/L (ref 101–111)
Creatinine, Ser: 0.81 mg/dL (ref 0.44–1.00)
GFR calc non Af Amer: >60 mL/min (ref >60)
Glucose, Bld: 156 mg/dL — ABNORMAL HIGH (ref 65–99)
POTASSIUM: 3.4 mmol/L — AB (ref 3.5–5.1)
Sodium: 142 mmol/L (ref 135–145)

## 2014-08-03 LAB — PROTIME-INR
INR: 1.81 — ABNORMAL HIGH (ref 0.00–1.49)
PROTHROMBIN TIME: 20.9 s — AB (ref 11.6–15.2)

## 2014-08-03 LAB — TROPONIN I

## 2014-08-03 LAB — TSH: TSH: 1.246 u[IU]/mL (ref 0.350–4.500)

## 2014-08-03 LAB — DIGOXIN LEVEL: Digoxin Level: 0.6 ng/mL — ABNORMAL LOW (ref 0.8–2.0)

## 2014-08-03 MED ORDER — ACETAMINOPHEN 650 MG RE SUPP: 650.0000 mg | Freq: Four times a day (QID) | RECTAL | Status: DC | PRN

## 2014-08-03 MED ORDER — FLUTICASONE PROPIONATE 50 MCG/ACT NA SUSP
2.0000 | Freq: Every day | NASAL | Status: DC | PRN
  Filled 2014-08-03: qty 16

## 2014-08-03 MED ORDER — PROMETHAZINE HCL 25 MG PO TABS: 12.5000 mg | ORAL_TABLET | Freq: Four times a day (QID) | ORAL | Status: DC | PRN

## 2014-08-03 MED ORDER — HYDROCODONE-ACETAMINOPHEN 5-325 MG PO TABS
1.0000 | ORAL_TABLET | ORAL | Status: DC | PRN
  Administered 2014-08-03: 2 via ORAL
  Filled 2014-08-03: qty 2

## 2014-08-03 MED ORDER — WARFARIN - PHARMACIST DOSING INPATIENT
Freq: Every day | Status: DC
  Administered 2014-08-05: 18:00:00

## 2014-08-03 MED ORDER — VERAPAMIL HCL ER 120 MG PO TBCR
120.0000 mg | EXTENDED_RELEASE_TABLET | Freq: Every day | ORAL | Status: DC
  Administered 2014-08-03: 120 mg via ORAL
  Filled 2014-08-03: qty 1

## 2014-08-03 MED ORDER — PANTOPRAZOLE SODIUM 40 MG PO TBEC
40.0000 mg | DELAYED_RELEASE_TABLET | Freq: Every day | ORAL | Status: DC
  Administered 2014-08-03 – 2014-08-06 (×4): 40 mg via ORAL
  Filled 2014-08-03 (×4): qty 1

## 2014-08-03 MED ORDER — CAPSAICIN 0.025 % EX CREA
TOPICAL_CREAM | Freq: Two times a day (BID) | CUTANEOUS | Status: DC
  Administered 2014-08-03 – 2014-08-06 (×7): via TOPICAL
  Filled 2014-08-03: qty 56.6

## 2014-08-03 MED ORDER — DIAZEPAM 2 MG PO TABS
1.0000 mg | ORAL_TABLET | Freq: Four times a day (QID) | ORAL | Status: DC | PRN
  Administered 2014-08-04: 2 mg via ORAL
  Filled 2014-08-03: qty 1

## 2014-08-03 MED ORDER — FOLIC ACID 1 MG PO TABS
2.0000 mg | ORAL_TABLET | Freq: Every day | ORAL | Status: DC
  Administered 2014-08-03 – 2014-08-06 (×4): 2 mg via ORAL
  Filled 2014-08-03 (×4): qty 2

## 2014-08-03 MED ORDER — SODIUM CHLORIDE 0.9 % IJ SOLN
3.0000 mL | Freq: Two times a day (BID) | INTRAMUSCULAR | Status: DC
  Administered 2014-08-03: 3 mL via INTRAVENOUS

## 2014-08-03 MED ORDER — METOPROLOL SUCCINATE ER 50 MG PO TB24
50.0000 mg | ORAL_TABLET | Freq: Every day | ORAL | Status: DC
  Administered 2014-08-04 – 2014-08-06 (×3): 50 mg via ORAL
  Filled 2014-08-03 (×3): qty 1

## 2014-08-03 MED ORDER — DEXTROSE 5 % IV SOLN
500.0000 mg | INTRAVENOUS | Status: DC
  Administered 2014-08-04 – 2014-08-06 (×3): 500 mg via INTRAVENOUS
  Filled 2014-08-03 (×3): qty 500

## 2014-08-03 MED ORDER — ACETAMINOPHEN 325 MG PO TABS
650.0000 mg | ORAL_TABLET | Freq: Four times a day (QID) | ORAL | Status: DC | PRN
  Administered 2014-08-03: 650 mg via ORAL
  Filled 2014-08-03: qty 2

## 2014-08-03 MED ORDER — METHOTREXATE (ANTI-RHEUMATIC) 2.5 MG PO TABS: 5.0000 mg | ORAL_TABLET | ORAL | Status: DC

## 2014-08-03 MED ORDER — WARFARIN SODIUM 5 MG PO TABS
7.5000 mg | ORAL_TABLET | Freq: Once | ORAL | Status: AC
  Administered 2014-08-03: 7.5 mg via ORAL
  Filled 2014-08-03 (×2): qty 1

## 2014-08-03 MED ORDER — DEXTROSE 5 % IV SOLN
500.0000 mg | Freq: Once | INTRAVENOUS | Status: AC
  Administered 2014-08-03: 500 mg via INTRAVENOUS
  Filled 2014-08-03: qty 500

## 2014-08-03 MED ORDER — ACETAMINOPHEN 500 MG PO TABS
1000.0000 mg | ORAL_TABLET | Freq: Once | ORAL | Status: AC
  Administered 2014-08-03: 1000 mg via ORAL
  Filled 2014-08-03: qty 2

## 2014-08-03 MED ORDER — CEFTRIAXONE SODIUM 1 G IJ SOLR
1.0000 g | Freq: Once | INTRAMUSCULAR | Status: AC
  Administered 2014-08-03: 1 g via INTRAVENOUS
  Filled 2014-08-03: qty 10

## 2014-08-03 MED ORDER — HYDROCODONE-ACETAMINOPHEN 5-325 MG PO TABS
2.0000 | ORAL_TABLET | Freq: Once | ORAL | Status: AC
  Administered 2014-08-03: 2 via ORAL
  Filled 2014-08-03: qty 2

## 2014-08-03 MED ORDER — CEFTRIAXONE SODIUM IN DEXTROSE 20 MG/ML IV SOLN
1.0000 g | INTRAVENOUS | Status: DC
  Administered 2014-08-04 – 2014-08-06 (×3): 1 g via INTRAVENOUS
  Filled 2014-08-03 (×3): qty 50

## 2014-08-03 MED ORDER — RISAQUAD PO CAPS
1.0000 | ORAL_CAPSULE | Freq: Every day | ORAL | Status: DC
  Administered 2014-08-03 – 2014-08-06 (×4): 1 via ORAL
  Filled 2014-08-03 (×4): qty 1

## 2014-08-03 MED ORDER — DIGOXIN 125 MCG PO TABS
0.1250 mg | ORAL_TABLET | Freq: Every day | ORAL | Status: DC
  Administered 2014-08-03: 0.125 mg via ORAL
  Filled 2014-08-03: qty 1

## 2014-08-03 MED ORDER — PREDNISONE 5 MG PO TABS
10.0000 mg | ORAL_TABLET | Freq: Every day | ORAL | Status: DC
Start: 2014-08-04 — End: 2014-08-06
  Administered 2014-08-04 – 2014-08-06 (×3): 10 mg via ORAL
  Filled 2014-08-03 (×3): qty 2

## 2014-08-03 MED ORDER — DIAZEPAM 2 MG PO TABS
2.0000 mg | ORAL_TABLET | Freq: Four times a day (QID) | ORAL | Status: DC | PRN
  Administered 2014-08-03: 2 mg via ORAL
  Filled 2014-08-03: qty 1

## 2014-08-03 MED ORDER — ADULT MULTIVITAMIN W/MINERALS CH
1.0000 | ORAL_TABLET | Freq: Every day | ORAL | Status: DC
  Administered 2014-08-03 – 2014-08-06 (×4): 1 via ORAL
  Filled 2014-08-03 (×7): qty 1

## 2014-08-03 MED ORDER — SODIUM CHLORIDE 0.9 % IV SOLN
Freq: Once | INTRAVENOUS | Status: AC
  Administered 2014-08-03: 08:00:00 via INTRAVENOUS

## 2014-08-03 MED ORDER — IOHEXOL 350 MG/ML SOLN
100.0000 mL | Freq: Once | INTRAVENOUS | Status: AC | PRN
  Administered 2014-08-03: 100 mL via INTRAVENOUS

## 2014-08-03 MED ORDER — PRAVASTATIN SODIUM 20 MG PO TABS
20.0000 mg | ORAL_TABLET | Freq: Every evening | ORAL | Status: DC
  Administered 2014-08-03 – 2014-08-06 (×4): 20 mg via ORAL
  Filled 2014-08-03 (×4): qty 1

## 2014-08-03 MED ORDER — METOPROLOL SUCCINATE ER 100 MG PO TB24
100.0000 mg | ORAL_TABLET | Freq: Every day | ORAL | Status: DC
  Administered 2014-08-03: 100 mg via ORAL
  Filled 2014-08-03: qty 1

## 2014-08-03 MED ORDER — FUROSEMIDE 40 MG PO TABS
40.0000 mg | ORAL_TABLET | Freq: Every day | ORAL | Status: DC
  Administered 2014-08-03 – 2014-08-04 (×2): 40 mg via ORAL
  Filled 2014-08-03 (×2): qty 1

## 2014-08-03 MED ORDER — METOPROLOL SUCCINATE ER 25 MG PO TB24: 25.0000 mg | ORAL_TABLET | Freq: Every day | ORAL | Status: DC

## 2014-08-03 NOTE — ED Notes (Signed)
hospitalist at bedside

## 2014-08-03 NOTE — ED Notes (Signed)
Patient transported to X-ray 

## 2014-08-03 NOTE — H&P (Signed)
[facility] History and Physical  [name] AST:[id number] DOB: 09-Apr-1940 DOA: 08/03/2014  Referring physician:  Veryl Speak PCP:  Kelton Pillar, Herschell Dimes, MD   Chief Complaint:  Chest pain, SOB  HPI:  The patient is a 74 y.o. year-old female with history of hypertension, hyperlipidemia, chronic atrial fibrillation on anticoagulation, rheumatoid arthritis, GERD who presents with posterior left back pain, left axillary and chest wall pain that started last night before she went to bed.  She was able to fall asleep but woke up around 2AM with severe pain in the same distribution, 8/10, worse with movement of her left shoulder and left neck.  She has some mild SOB with deep breath.  Denies nausea, vomiting.  She has chronic night sweats and hot flashes.  She is anxious that her pains are heart-related because she had somewhat similar pains with a previous episode of atrial fibrillation. She denies fevers, chills, sinus congestion, sore throat.  Pain has been constant and was partially relieved by vicodin in the emergency department, but she did not try any medications at home.    In the emergency department, her vital signs were stable except for mild hypertension to 160/80. She was afebrile. Her white blood cell count was 10.4. Her INR was mildly subtherapeutic at 1.8. Chest x-ray demonstrated a suggestion of a mild lower lobe opacity. CT angiogram chest demonstrated no evidence of pulmonary embolism although she was found to have a small effusion on the left side and mild atelectasis versus mild pneumonia. She also had a small pericardial effusion. She was given hydrocodone for pain, ceftriaxone and azithromycin and is being admitted for possible community-acquired pneumonia. Troponin was negative, and EKG demonstrated rate controlled atrial fibrillation with diffusely flattened T waves, stable from prior.  Review of Systems:  General:  Denies fevers, chills, but has night sweats and  hot flashes which are chronic.  Denies weight loss or gain HEENT:  Denies changes to hearing and vision, rhinorrhea, sinus congestion, sore throat CV:  Denies chest pain and palpitations, lower extremity edema.  PULM:  Denies SOB, wheezing, cough.   GI:  Denies nausea, vomiting, constipation, diarrhea.   GU:  Denies dysuria, frequency, urgency ENDO:  Denies polyuria, polydipsia.   HEME:  Denies hematemesis, blood in stools, melena, abnormal bruising or bleeding.  LYMPH:  Denies lymphadenopathy.   MSK:  Per HPI DERM:  Denies skin rash or ulcer.   NEURO:  Denies focal numbness, weakness, slurred speech, confusion, facial droop.  PSYCH:  Denies anxiety and depression.    Past Medical History  Diagnosis Date  . Esophageal reflux   . Rheumatoid arthritis 1975  . Osteopenia   . Polymorphic light eruption 2004  . Hyperlipidemia   . Adhesive capsulitis of left shoulder 1980  . Right middle ear infection 1986  . Acne rosacea   . Allergic rhinitis   . Hypercholesterolemia   . Atrial fibrillation   . Hypertension   . Edema    Past Surgical History  Procedure Laterality Date  . Cardiac catheterization    . Cardioversion     Social History:  reports that she has never smoked. She has never used smokeless tobacco. She reports that she does not drink alcohol or use illicit drugs.   Allergies  Allergen Reactions  . Arthrotec [Diclofenac-Misoprostol] Diarrhea  . Morphine Sulfate Other (See Comments)    Erratic behavior; hallucinations  . Amoxicillin Rash  . Gold-Containing Drug Products Rash  . Macrodantin [Nitrofurantoin Macrocrystal] Rash  . Naproxen  Rash  . Penicillins Rash    Family History  Problem Relation Age of Onset  . Heart disease Father   . Heart disease Mother   . Other Mother     prolonged qtc     Prior to Admission medications   Medication Sig Start Date End Date Taking? Authorizing Provider  acidophilus (RISAQUAD) CAPS capsule Take 1 capsule by mouth daily.    Yes Historical Provider, MD  cholecalciferol (VITAMIN D) 1000 UNITS tablet Take 2,000 Units by mouth daily.   Yes Historical Provider, MD  digoxin (LANOXIN) 0.125 MG tablet TAKE 1 TABLET (0.125 MG TOTAL) BY MOUTH DAILY. 07/07/14  Yes Jettie Booze, MD  etanercept (ENBREL) 50 MG/ML injection Inject 50 mg into the skin once a week. On Friday   Yes Historical Provider, MD  fluticasone (FLONASE) 50 MCG/ACT nasal spray Place 2 sprays into both nostrils daily as needed for allergies or rhinitis.    Yes Historical Provider, MD  folic acid (FOLVITE) 1 MG tablet Take 2 mg by mouth daily.    Yes Historical Provider, MD  furosemide (LASIX) 40 MG tablet 1 tablet by mouth daily or as directed Patient taking differently: Take 40 mg by mouth daily.  05/15/14  Yes Jettie Booze, MD  methotrexate (RHEUMATREX) 2.5 MG tablet Take 5 mg by mouth 2 (two) times a week. Take 2 tablets by mouth on Tuesday and Wednesday each week.   Yes Historical Provider, MD  metoprolol succinate (TOPROL-XL) 100 MG 24 hr tablet TAKE 1 TABLET BY MOUTH EVERY DAY WITH OR IMMEDIATELY FOLLOWING A MEAL. 07/07/14  Yes Jettie Booze, MD  Multiple Vitamins-Minerals (MULTIVITAMIN WITH MINERALS) tablet Take 1 tablet by mouth daily.   Yes Historical Provider, MD  omeprazole (PRILOSEC) 20 MG capsule Take 20 mg by mouth every morning.    Yes Historical Provider, MD  pravastatin (PRAVACHOL) 20 MG tablet TAKE 1 TABLET EVERY EVENING 06/11/14  Yes Jettie Booze, MD  predniSONE (DELTASONE) 5 MG tablet Take 2.5 mg by mouth daily with breakfast.    Yes Historical Provider, MD  verapamil (CALAN-SR) 120 MG CR tablet TAKE 1 TABLET BY MOUTH EVERY MORNING WITH FOOD 02/10/14  Yes Jettie Booze, MD  warfarin (COUMADIN) 5 MG tablet Take 1 tablet by mouth daily or as directed Patient taking differently: Take 5 mg by mouth daily. Take 1 tablet by mouth Sunday through Friday. Take 1/2 tablet on Saturday 06/10/14  Yes Jettie Booze, MD    Physical Exam: Filed Vitals:   08/03/14 0720 08/03/14 0844 08/03/14 1022 08/03/14 1029  BP:  130/73 121/67   Pulse: 81 120  80  Temp: 98.2 F (36.8 C) 98.2 F (36.8 C)    TempSrc: Oral Oral    Resp: 18 18    Height:      Weight:      SpO2: 95% 96%       General:  Adult female, NAD  Eyes:  PERRL, anicteric, non-injected.  ENT:  Nares clear.  OP clear, non-erythematous without plaques or exudates.  MMM.  Neck:  Supple without TM or JVD.    Lymph:  No cervical, supraclavicular, or submandibular LAD.  Cardiovascular:  IRRR, rate controlled, normal S1, S2, without m/r/g.  2+ pulses, warm extremities  Respiratory:  Rales at the bilateral bases that clear somewhat with repeat respirations, no wheezes or rhonchi, no increased WOB.  Abdomen:  NABS.  Soft, ND/NT.    Skin:  No rashes or focal lesions.  Musculoskeletal:  Normal bulk and tone.  No LE edema.  TTP along the trapezius muscle and rhomboid on the left.  Pain worse with passive and active ROM of the left shoulder and also with movement of neck.    Psychiatric:  A & O x 4.  Somewhat anxious about possible atrial-fibrillation related pain  Neurologic:  CN 3-12 intact.  5/5 strength.  Sensation intact.  Labs on Admission:  Basic Metabolic Panel:  Recent Labs Lab 08/03/14 0446  NA 142  K 3.4*  CL 105  CO2 28  GLUCOSE 156*  BUN 10  CREATININE 0.81  CALCIUM 9.8   Liver Function Tests: No results for input(s): AST, ALT, ALKPHOS, BILITOT, PROT, ALBUMIN in the last 168 hours. No results for input(s): LIPASE, AMYLASE in the last 168 hours. No results for input(s): AMMONIA in the last 168 hours. CBC:  Recent Labs Lab 08/03/14 0446  WBC 10.4  HGB 13.6  HCT 42.3  MCV 95.3  PLT 240   Cardiac Enzymes:  Recent Labs Lab 08/03/14 0935  TROPONINI <0.03    BNP (last 3 results) No results for input(s): BNP in the last 8760 hours.  ProBNP (last 3 results)  Recent Labs  10/29/13 1803  PROBNP 366.6*     CBG: No results for input(s): GLUCAP in the last 168 hours.  Radiological Exams on Admission: Dg Chest 2 View  08/03/2014   CLINICAL DATA:  Acute onset of severe chest pain and shortness of breath. Pain radiates to the left shoulder and down the arm. Initial encounter.  EXAM: CHEST  2 VIEW  COMPARISON:  Chest radiograph performed 10/29/2013  FINDINGS: The lungs are well-aerated. There is suggestion of focal lower lobe airspace opacity on the lateral view. There is no evidence of pleural effusion or pneumothorax.  The heart is mildly enlarged. No acute osseous abnormalities are seen.  IMPRESSION: Suggestion of mild lower lobe opacity on the lateral view, concerning for mild pneumonia.   Electronically Signed   By: Garald Balding M.D.   On: 08/03/2014 05:15   Ct Angio Chest Pe W/cm &/or Wo Cm  08/03/2014   CLINICAL DATA:  Left-sided chest pain radiating to the back that began yesterday.  EXAM: CT ANGIOGRAPHY CHEST WITH CONTRAST  TECHNIQUE: Multidetector CT imaging of the chest was performed using the standard protocol during bolus administration of intravenous contrast. Multiplanar CT image reconstructions and MIPs were obtained to evaluate the vascular anatomy.  CONTRAST:  166mL OMNIPAQUE IOHEXOL 350 MG/ML SOLN  COMPARISON:  04/06/2010.  Chest radiography same day.  FINDINGS: Pulmonary arterial opacification is good. There are no pulmonary emboli. The right lung shows mild volume loss at the base. There is a bleb in the right costophrenic angle region of the right lower lobe. On the left, there is a small effusion layering dependently. There is volume loss of the left base associated with that. This could be simple atelectasis or related to pneumonia. There is no hilar or mediastinal mass or lymphadenopathy. There is a small amount of pericardial fluid. No advanced coronary artery calcification. Small hiatal hernia in the lower mediastinum. Upper abdominal structures are otherwise unremarkable. Ordinary  degenerative changes affect the thoracic spine. No evidence of fracture.  Review of the MIP images confirms the above findings.  IMPRESSION: No pulmonary emboli.  Small pleural effusion on the left layering dependently. Mild atelectasis at the lung bases that could be simple atelectasis or mild pneumonia.  Small pericardial effusion.   Electronically Signed   By: Elta Guadeloupe  Shogry M.D.   On: 08/03/2014 07:08    EKG: Independently reviewed.  rate controlled atrial fibrillation with diffusely flattened T waves, stable from prior.    Assessment/Plan Principal Problem:   Chest pain Active Problems:   Atrial fibrillation   Hypertension   Hyperlipidemia   CAP (community acquired pneumonia)  ---  Left lateral chest pain/left shoulder pain:  Differential includes ACS, GERD, MSK pain from her rheumatoid arthritis or pain related to pneumonia.  HEART Score = 3.  Last echo was in 2009 which was normal.  She also had a negative stress test in 2009 and a completely normal heart catheterization in 2012.  Followed by Dr. Irish Lack.  Low suspicion for ACS.  Most likely this pain is related to possible pneumonia or to flare of her RF.  She was hospitalized in 2012 with similar complaints and was treated with elevated prednisone doses for RF which improved her symptoms.   -  Telemetry -  Cycle troponins -  A1c  -  vicodin prn >> made her loopy -  Trial of valium and tylenol instead -  Outpatient f/u with cardiology for further testing if indicated  Incidental finding of community-acquired pneumonia, left lower lobe. -  Ceftriaxone and azithromycin  -  Legionella and strep pneumo antigens  Chest and back pain, no evidence of dissection.  May be related to her pneumonia or to her  -  Telemetry -  Cycle troponins  Chronic atrial fibrillation, rate controlled, CHADs2vasc = 3   -   Coumadin dose by pharmacy -  Continue digoxin, metoprolol, verapamil  Hypertension, blood pressures elevated, likely secondary to  pain -  Control pain as above -  Continue metoprolol, verapamil -  Add hydralazine when necessary  GERD, stable, continue omeprazole  Rheumatoid arthritis, with possible flare given pleural effusion, pericardial effusion, continue methotrexate - Increase prednisone to 10mg  daily   Diet:  Healthy heart  Access:  PIV  IVF:  Off  Proph:  Warfarin   Code Status: full Family Communication: patient and her family Disposition Plan: Admit to telemetry.  Possible d/c later today if pain improved, O2 levels stable, troponin negative.    Time spent: 60 min Janece Canterbury [facility] Pager [phone]  If 7PM-7AM, please contact night-coverage [email or web address] Password [email or web address] 08/03/2014, 2:16 PM

## 2014-08-03 NOTE — Progress Notes (Signed)
PHARMACIST - PHYSICIAN COMMUNICATION DR:   Short CONCERNING:  Methotrexate   DESCRIPTION: Methotrexate reordered upon admission however due to hold criteria of pleural effusion, order has been discontinued.  Please resume when clinically appropriate.     Methotrexate (Trexall; Rheumatrex) hold criteria  Hgb < 8  WBC < 3  Pltc < 100K  SCr > 1.5x baseline (or > 2 if baseline unknown)  AST or ALT >3x ULN  Bili > 1.5x ULN  Ascites or pleural effusion  Diarrhea - Grade 2 or higher  Ulcerative stomatitis  Unexplained pneumonitis / hypoxemia   Thanks! Ralene Bathe, PharmD, BCPS 08/03/2014, 9:30 AM  Pager: [phone]

## 2014-08-03 NOTE — ED Notes (Signed)
Pt c/o pain between shoulder blades, under left breast and left arm, with nausea.

## 2014-08-03 NOTE — ED Notes (Signed)
Pt alert and oriented x4. Respirations even and unlabored, bilateral symmetrical rise and fall of chest. Skin warm and dry. In no acute distress. Denies needs.   

## 2014-08-03 NOTE — ED Provider Notes (Signed)
CSN: [id number]     Arrival date & time 08/03/14  0426 History   First MD Initiated Contact with Patient 08/03/14 [phone]     Chief Complaint  Patient presents with  . Shoulder Pain  . Back Pain     (Consider location/radiation/quality/duration/timing/severity/associated sxs/prior Treatment) HPI Comments: Patient is a 74 year old female with history of paroxysmal atrial fibrillation, rheumatoid arthritis. She presents for evaluation of discomfort in her upper back and left shoulder with associated nausea and shortness of breath. The symptoms began this evening at approximately 10:00. She denies swelling in her legs, fever, or productive cough. She reports the discomfort is worse with moving and breathing and relieved with rest.  She takes Coumadin and her cardiologist is Dr. Irish Lack.  The history is provided by the patient.    Past Medical History  Diagnosis Date  . Esophageal reflux   . Rheumatoid arthritis 1975  . Osteopenia   . Polymorphic light eruption 2004  . Hyperlipidemia   . Adhesive capsulitis of left shoulder 1980  . Right middle ear infection 1986  . Acne rosacea   . Allergic rhinitis   . Hypercholesterolemia   . Atrial fibrillation   . Hypertension   . Edema    Past Surgical History  Procedure Laterality Date  . Cardiac catheterization    . Cardioversion     Family History  Problem Relation Age of Onset  . Heart disease Father    History  Substance Use Topics  . Smoking status: Never Smoker   . Smokeless tobacco: Never Used  . Alcohol Use: No   OB History    No data available     Review of Systems  All other systems reviewed and are negative.     Allergies  Arthrotec; Morphine sulfate; Amoxicillin; Gold bond; Macrodantin; Naproxen; and Penicillins  Home Medications   Prior to Admission medications   Medication Sig Start Date End Date Taking? Authorizing Provider  Calcium Carbonate-Vitamin D (CALCIUM + D PO) Take 1,000 mg by mouth daily.      Historical Provider, MD  cholecalciferol (VITAMIN D) 1000 UNITS tablet Take 2,000 Units by mouth daily.    Historical Provider, MD  digoxin (LANOXIN) 0.125 MG tablet TAKE 1 TABLET (0.125 MG TOTAL) BY MOUTH DAILY. 07/07/14   Jettie Booze, MD  etanercept (ENBREL) 50 MG/ML injection Inject 50 mg into the skin once a week.    Historical Provider, MD  fluticasone (FLONASE) 50 MCG/ACT nasal spray Place 2 sprays into both nostrils daily as needed for allergies or rhinitis.     Historical Provider, MD  folic acid (FOLVITE) 1 MG tablet Take 2 mg by mouth daily.     Historical Provider, MD  furosemide (LASIX) 40 MG tablet 1 tablet by mouth daily or as directed 05/15/14   Jettie Booze, MD  meclizine (ANTIVERT) 25 MG tablet Take 25 mg by mouth daily as needed for dizziness.    Historical Provider, MD  methotrexate (RHEUMATREX) 2.5 MG tablet Take 2 tablets by mouth on Tuesday and Wednesday each week.    Historical Provider, MD  metoprolol succinate (TOPROL-XL) 100 MG 24 hr tablet TAKE 1 TABLET BY MOUTH EVERY DAY WITH OR IMMEDIATELY FOLLOWING A MEAL. 07/07/14   Jettie Booze, MD  Multiple Vitamins-Minerals (MULTIVITAMIN WITH MINERALS) tablet Take 1 tablet by mouth daily.    Historical Provider, MD  omeprazole (PRILOSEC) 20 MG capsule Take 20 mg by mouth daily.    Historical Provider, MD  pravastatin (PRAVACHOL) 20 MG  tablet Take 20 mg by mouth daily.  05/20/14   Historical Provider, MD  pravastatin (PRAVACHOL) 20 MG tablet TAKE 1 TABLET EVERY EVENING 06/11/14   Jettie Booze, MD  predniSONE (DELTASONE) 5 MG tablet Take 2.5 mg by mouth daily with breakfast.     Historical Provider, MD  verapamil (CALAN-SR) 120 MG CR tablet TAKE 1 TABLET BY MOUTH EVERY MORNING WITH FOOD 02/10/14   Jettie Booze, MD  warfarin (COUMADIN) 5 MG tablet Take 1 tablet by mouth daily or as directed 06/10/14   Jettie Booze, MD   BP 160/80 mmHg  Pulse 83  Temp(Src) 97.4 F (36.3 C) (Oral)  Resp 18  Ht 5'  3" (1.6 m)  Wt 154 lb (69.854 kg)  BMI 27.29 kg/m2  SpO2 96% Physical Exam  Constitutional: She is oriented to person, place, and time. She appears well-developed and well-nourished. No distress.  HENT:  Head: Normocephalic and atraumatic.  Neck: Normal range of motion. Neck supple.  Cardiovascular: Normal rate.  Exam reveals no gallop and no friction rub.   No murmur heard. Heart is irregularly irregular  Pulmonary/Chest: Effort normal and breath sounds normal. No respiratory distress. She has no wheezes.  Abdominal: Soft. Bowel sounds are normal. She exhibits no distension. There is no tenderness.  Musculoskeletal: Normal range of motion.  Neurological: She is alert and oriented to person, place, and time.  Skin: Skin is warm and dry. She is not diaphoretic.  Nursing note and vitals reviewed.   ED Course  Procedures (including critical care time) Labs Review Labs Reviewed  BASIC METABOLIC PANEL  CBC  PROTIME-INR  DIGOXIN LEVEL    Imaging Review No results found.   EKG Interpretation   Date/Time:  Monday August 03 2014 04:37:31 EDT Ventricular Rate:  76 PR Interval:    QRS Duration: 87 QT Interval:  389 QTC Calculation: 437 R Axis:   -10 Text Interpretation:  Atrial fibrillation RSR' in V1 or V2, right VCD or  RVH Borderline T abnormalities, diffuse leads Confirmed by Seldon Barrell  MD,  Kaydi Kley ([id number]) on 08/03/2014 4:41:42 AM      MDM   Final diagnoses:  None    Patient presents with presents with complaints of left sided chest pain and palpitations.  Workup reveals a possible pneumonia with a left sided pleural effusion.  She is in afib as well.  She will be admitted to the Hospitalist service for antibiotics, observation, and possible cardiac consultation.    Veryl Speak, MD 08/03/14 [phone]

## 2014-08-03 NOTE — Progress Notes (Signed)
Called by RN secondary to bradycardia and mild hypotension.  HR in 30s.  Patient is mentating with some confusion after receiving vicodin and valium but her pains have improved.  Case discussed with Dr. Ron Parker, cardiology, who recommended stopping her dig and her verapamil and decreasing her metoprolol XL to 50mg  daily.  Will make these changes and have her follow up with cardiology at which time some of her medications may be increased or resumed if needed.

## 2014-08-03 NOTE — Progress Notes (Signed)
ANTICOAGULATION CONSULT NOTE - Initial Consult  Pharmacy Consult for warfarin Indication: atrial fibrillation  Allergies  Allergen Reactions  . Arthrotec [Diclofenac-Misoprostol] Diarrhea  . Morphine Sulfate Other (See Comments)    Erratic behavior; hallucinations  . Amoxicillin Rash  . Gold-Containing Drug Products Rash  . Macrodantin [Nitrofurantoin Macrocrystal] Rash  . Naproxen Rash  . Penicillins Rash    Patient Measurements: Height: 5\' 3"  (160 cm) Weight: 154 lb (69.854 kg) IBW/kg (Calculated) : 52.4  Vital Signs: Temp: 98.2 F (36.8 C) (07/18 0844) Temp Source: Oral (07/18 0844) BP: 121/67 mmHg (07/18 1022) Pulse Rate: 80 (07/18 1029)  Labs:  Recent Labs  08/03/14 0446 08/03/14 0935  HGB 13.6  --   HCT 42.3  --   PLT 240  --   LABPROT 20.9*  --   INR 1.81*  --   CREATININE 0.81  --   TROPONINI  --  <0.03    Estimated Creatinine Clearance: 58 mL/min (by C-G formula based on Cr of 0.81).   Medical History: Past Medical History  Diagnosis Date  . Esophageal reflux   . Rheumatoid arthritis 1975  . Osteopenia   . Polymorphic light eruption 2004  . Hyperlipidemia   . Adhesive capsulitis of left shoulder 1980  . Right middle ear infection 1986  . Acne rosacea   . Allergic rhinitis   . Hypercholesterolemia   . Atrial fibrillation   . Hypertension   . Edema     Medications:  Prescriptions prior to admission  Medication Sig Dispense Refill Last Dose  . acidophilus (RISAQUAD) CAPS capsule Take 1 capsule by mouth daily.   08/02/2014 at Unknown time  . cholecalciferol (VITAMIN D) 1000 UNITS tablet Take 2,000 Units by mouth daily.   08/02/2014 at Unknown time  . digoxin (LANOXIN) 0.125 MG tablet TAKE 1 TABLET (0.125 MG TOTAL) BY MOUTH DAILY. 30 tablet 4 08/02/2014 at Unknown time  . etanercept (ENBREL) 50 MG/ML injection Inject 50 mg into the skin once a week. On Friday   Past Week at Unknown time  . fluticasone (FLONASE) 50 MCG/ACT nasal spray Place 2  sprays into both nostrils daily as needed for allergies or rhinitis.    Past Week at Unknown time  . folic acid (FOLVITE) 1 MG tablet Take 2 mg by mouth daily.    08/02/2014 at Unknown time  . furosemide (LASIX) 40 MG tablet 1 tablet by mouth daily or as directed (Patient taking differently: Take 40 mg by mouth daily. ) 30 tablet 5 08/02/2014 at Unknown time  . methotrexate (RHEUMATREX) 2.5 MG tablet Take 5 mg by mouth 2 (two) times a week. Take 2 tablets by mouth on Tuesday and Wednesday each week.   Past Week at Unknown time  . metoprolol succinate (TOPROL-XL) 100 MG 24 hr tablet TAKE 1 TABLET BY MOUTH EVERY DAY WITH OR IMMEDIATELY FOLLOWING A MEAL. 90 tablet 0 08/02/2014 at 0900  . Multiple Vitamins-Minerals (MULTIVITAMIN WITH MINERALS) tablet Take 1 tablet by mouth daily.   08/02/2014 at Unknown time  . omeprazole (PRILOSEC) 20 MG capsule Take 20 mg by mouth every morning.    Past Week at Unknown time  . pravastatin (PRAVACHOL) 20 MG tablet TAKE 1 TABLET EVERY EVENING 30 tablet 11 08/02/2014 at Unknown time  . predniSONE (DELTASONE) 5 MG tablet Take 2.5 mg by mouth daily with breakfast.    08/02/2014 at Unknown time  . verapamil (CALAN-SR) 120 MG CR tablet TAKE 1 TABLET BY MOUTH EVERY MORNING WITH FOOD 30 tablet  5 08/02/2014 at Unknown time  . warfarin (COUMADIN) 5 MG tablet Take 1 tablet by mouth daily or as directed (Patient taking differently: Take 5 mg by mouth daily. Take 1 tablet by mouth Sunday through Friday. Take 1/2 tablet on Saturday) 30 tablet 3 08/02/2014 at 1800   Scheduled:  . acidophilus  1 capsule Oral Daily  . [START ON 08/04/2014] azithromycin  500 mg Intravenous Q24H  . capsaicin   Topical BID  . cefTRIAXone (ROCEPHIN)  IV  1 g Intravenous Q24H  . digoxin  0.125 mg Oral Daily  . folic acid  2 mg Oral Daily  . furosemide  40 mg Oral Daily  . metoprolol succinate  100 mg Oral Daily  . multivitamin with minerals  1 tablet Oral Daily  . pantoprazole  40 mg Oral Daily  . pravastatin   20 mg Oral QPM  . [START ON 08/04/2014] predniSONE  10 mg Oral Q breakfast  . sodium chloride  3 mL Intravenous Q12H  . verapamil  120 mg Oral Daily    Assessment: 73 yoF with HTN, HLD, GERD, RA, chronic Afib on warfarin admitted 7/18 for CAP; to continue warfarin per pharmacy while admitted   Baseline INR slightly subtherapeutic  Prior anticoagulation: warfarin 5 mg PO daily except 2.5 mg on Saturdays; last dose yesterday 7/17  Today, 08/03/2014:  CBC: wnl  INR subtherapeutic  Major drug interactions: MTX at home (held in hospital)  No bleeding issues per nursing  Regular diet  Goal of Therapy: INR 2-3  Plan:  Warfarin 7.5 mg PO tonight at 18:00  Daily INR  CBC at least q72 hr while on warfarin  Monitor for signs of bleeding or thrombosis   Reuel Boom, PharmD, BCPS Pager: [phone] 08/03/2014, 1:52 PM

## 2014-08-03 NOTE — ED Notes (Signed)
Pt reports left-sided chest pain that radiates to her back that began yesterday evening, pt states she took tums at home w/ minimal relief, pain became progressively worse. Pt associates the pain from similar pain she experienced in the past while in a-fib. Pt speaking complete sentences, admits to increased pain w/ movement.

## 2014-08-04 ENCOUNTER — Observation Stay (HOSPITAL_COMMUNITY): Payer: Medicare Other

## 2014-08-04 DIAGNOSIS — I481 Persistent atrial fibrillation: Secondary | ICD-10-CM | POA: Diagnosis not present

## 2014-08-04 DIAGNOSIS — M549 Dorsalgia, unspecified: Secondary | ICD-10-CM | POA: Diagnosis present

## 2014-08-04 DIAGNOSIS — Z880 Allergy status to penicillin: Secondary | ICD-10-CM | POA: Diagnosis not present

## 2014-08-04 DIAGNOSIS — E780 Pure hypercholesterolemia: Secondary | ICD-10-CM | POA: Diagnosis present

## 2014-08-04 DIAGNOSIS — J189 Pneumonia, unspecified organism: Secondary | ICD-10-CM | POA: Diagnosis not present

## 2014-08-04 DIAGNOSIS — I10 Essential (primary) hypertension: Secondary | ICD-10-CM | POA: Diagnosis not present

## 2014-08-04 DIAGNOSIS — Z7901 Long term (current) use of anticoagulants: Secondary | ICD-10-CM | POA: Diagnosis not present

## 2014-08-04 DIAGNOSIS — J90 Pleural effusion, not elsewhere classified: Secondary | ICD-10-CM | POA: Diagnosis present

## 2014-08-04 DIAGNOSIS — I4891 Unspecified atrial fibrillation: Secondary | ICD-10-CM

## 2014-08-04 DIAGNOSIS — K219 Gastro-esophageal reflux disease without esophagitis: Secondary | ICD-10-CM | POA: Diagnosis present

## 2014-08-04 DIAGNOSIS — M069 Rheumatoid arthritis, unspecified: Secondary | ICD-10-CM | POA: Diagnosis present

## 2014-08-04 DIAGNOSIS — R0602 Shortness of breath: Secondary | ICD-10-CM | POA: Diagnosis present

## 2014-08-04 DIAGNOSIS — I482 Chronic atrial fibrillation: Secondary | ICD-10-CM | POA: Diagnosis present

## 2014-08-04 DIAGNOSIS — E785 Hyperlipidemia, unspecified: Secondary | ICD-10-CM | POA: Diagnosis not present

## 2014-08-04 DIAGNOSIS — Z885 Allergy status to narcotic agent status: Secondary | ICD-10-CM | POA: Diagnosis not present

## 2014-08-04 DIAGNOSIS — R0789 Other chest pain: Secondary | ICD-10-CM | POA: Diagnosis present

## 2014-08-04 DIAGNOSIS — N951 Menopausal and female climacteric states: Secondary | ICD-10-CM | POA: Diagnosis present

## 2014-08-04 DIAGNOSIS — E876 Hypokalemia: Secondary | ICD-10-CM | POA: Diagnosis present

## 2014-08-04 DIAGNOSIS — Z886 Allergy status to analgesic agent status: Secondary | ICD-10-CM | POA: Diagnosis not present

## 2014-08-04 DIAGNOSIS — J9811 Atelectasis: Secondary | ICD-10-CM | POA: Diagnosis present

## 2014-08-04 DIAGNOSIS — M25512 Pain in left shoulder: Secondary | ICD-10-CM | POA: Diagnosis present

## 2014-08-04 DIAGNOSIS — Z7952 Long term (current) use of systemic steroids: Secondary | ICD-10-CM | POA: Diagnosis not present

## 2014-08-04 DIAGNOSIS — R071 Chest pain on breathing: Secondary | ICD-10-CM

## 2014-08-04 DIAGNOSIS — I959 Hypotension, unspecified: Secondary | ICD-10-CM | POA: Diagnosis present

## 2014-08-04 DIAGNOSIS — Z79899 Other long term (current) drug therapy: Secondary | ICD-10-CM | POA: Diagnosis not present

## 2014-08-04 DIAGNOSIS — I313 Pericardial effusion (noninflammatory): Secondary | ICD-10-CM | POA: Diagnosis present

## 2014-08-04 DIAGNOSIS — Z8249 Family history of ischemic heart disease and other diseases of the circulatory system: Secondary | ICD-10-CM | POA: Diagnosis not present

## 2014-08-04 DIAGNOSIS — R61 Generalized hyperhidrosis: Secondary | ICD-10-CM | POA: Diagnosis present

## 2014-08-04 DIAGNOSIS — M8580 Other specified disorders of bone density and structure, unspecified site: Secondary | ICD-10-CM | POA: Diagnosis present

## 2014-08-04 LAB — CBC
HCT: 39.6 % (ref 36.0–46.0)
Hemoglobin: 12.7 g/dL (ref 12.0–15.0)
MCH: 30.8 pg (ref 26.0–34.0)
MCHC: 32.1 g/dL (ref 30.0–36.0)
MCV: 95.9 fL (ref 78.0–100.0)
Platelets: 202 10*3/uL (ref 150–400)
RBC: 4.13 MIL/uL (ref 3.87–5.11)
RDW: 17.1 % — AB (ref 11.5–15.5)
WBC: 9 10*3/uL (ref 4.0–10.5)

## 2014-08-04 LAB — PROTIME-INR
INR: 2.14 — ABNORMAL HIGH (ref 0.00–1.49)
Prothrombin Time: 23.7 seconds — ABNORMAL HIGH (ref 11.6–15.2)

## 2014-08-04 LAB — HEMOGLOBIN A1C
HEMOGLOBIN A1C: 6.5 % — AB (ref 4.8–5.6)
Mean Plasma Glucose: 140 mg/dL

## 2014-08-04 MED ORDER — DIAZEPAM 2 MG PO TABS
1.0000 mg | ORAL_TABLET | Freq: Four times a day (QID) | ORAL | Status: DC | PRN
  Administered 2014-08-04 – 2014-08-05 (×2): 1 mg via ORAL
  Filled 2014-08-04 (×2): qty 1

## 2014-08-04 MED ORDER — WARFARIN SODIUM 5 MG PO TABS
5.0000 mg | ORAL_TABLET | Freq: Once | ORAL | Status: AC
  Administered 2014-08-04: 5 mg via ORAL
  Filled 2014-08-04: qty 1

## 2014-08-04 MED ORDER — TRAMADOL HCL 50 MG PO TABS
50.0000 mg | ORAL_TABLET | Freq: Four times a day (QID) | ORAL | Status: DC | PRN
  Administered 2014-08-04 – 2014-08-05 (×3): 50 mg via ORAL
  Filled 2014-08-04 (×4): qty 1

## 2014-08-04 MED ORDER — FUROSEMIDE 10 MG/ML IJ SOLN
40.0000 mg | Freq: Two times a day (BID) | INTRAMUSCULAR | Status: DC
Start: 2014-08-04 — End: 2014-08-06
  Administered 2014-08-04 – 2014-08-06 (×4): 40 mg via INTRAVENOUS
  Filled 2014-08-04 (×4): qty 4

## 2014-08-04 MED ORDER — ACETAMINOPHEN 500 MG PO TABS
1000.0000 mg | ORAL_TABLET | Freq: Three times a day (TID) | ORAL | Status: DC
  Administered 2014-08-04 – 2014-08-06 (×5): 1000 mg via ORAL
  Filled 2014-08-04 (×5): qty 2

## 2014-08-04 NOTE — Progress Notes (Signed)
  Echocardiogram 2D Echocardiogram has been performed.  [name] M 08/04/2014, 12:26 PM

## 2014-08-04 NOTE — Progress Notes (Signed)
[facility] PROGRESS NOTE  COOKIE PORE ONG:[id number] DOB: 08-24-1940 DOA: 08/03/2014 PCP: Lottie Dawson, MD  Brief Summary  The patient is a 74 y.o. year-old female with history of hypertension, hyperlipidemia, chronic atrial fibrillation on anticoagulation, rheumatoid arthritis, GERD who presented with severe left back pain, left axillary and chest wall pain the night prior to admission.  Pain was pleuritic and associated with mild SOB.  She denied fevers, chills, sinus congestion, sore throat. In the emergency department, CT angiogram chest demonstrated no evidence of pulmonary embolism but did demonstrate possibly pneumonia with small effusion in the LLL.  She was given hydrocodone for pain, ceftriaxone and azithromycin and was admitted for community-acquired pneumonia. Troponin was negative, and EKG demonstrated rate controlled atrial fibrillation with diffusely flattened T waves, stable from prior.  Assessment/Plan  Left lateral chest pain/left shoulder pain are likely secondary to her pneumonia.  CT was negative for pulmonary embolism and dissection.  She was hospitalized in 2012 with similar complaints and was treated with elevated prednisone doses for RF which improved her symptoms. Troponins were negative and her ECHO demonstrates no evidence of wall motion abnormalities.  She tried vicodin which made her confused.  We tried ultram next which also made her sleep and confused.  Valium helps the most.   -  Continue low dose valium and ultram -  IS  Community-acquired pneumonia, left lower lobe with low grade fever overnight. - Continue ceftriaxone and azithromycin  - Legionella and strep pneumo antigens  Bilateral rales on exam today to mid-back which may be related to pneumonia or acute diastolic heart failure -  CXR:  Pulmonary edema -  ECHO was not adequate enough to detect diastolic dysfunction -  D/c oral lasix -  Start lasix 40mg  IV BID -  Daily  weights -  Strict I/O -  Repeat electrolytes in AM  Chronic atrial fibrillation, bradycardic and mildly hypotensive on day of admission, CHADs2vasc = 3  -Coumadin dose by pharmacy -  Per conversation with cardiology, stopped dig and verapamil and halved dose of metoprolol - f/u with cardiology as outpatient  Hypertension, blood pressures low normal to hypotensive yesterday - place hold parameter on metoprolol - verapamil discontinued  GERD, stable, continue omeprazole  Rheumatoid arthritis, with possible flare given pleural effusion, pericardial effusion, continue methotrexate - Increased to prednisone 10mg  daily  Diet:  Healthy heart Access:  PIV IVF:  off Proph:  warfarin  Code Status: full Family Communication: patient and her husband Disposition Plan: to home likely Wed if pain and breathing improved.    Consultants:  none  Procedures:  CT angio chest  Antibiotics:  Ceftriaxone and azithromycin 7/18 >   HPI/Subjective:  Severe pain in the left shoulder and left chest that makes movement even in the bed difficult.  Still feels hazy or fuzzy from side effects of her medications.  Absolutely refused morphine, dilaudid, vicodin, oxycodone.     Objective: Filed Vitals:   08/03/14 2158 08/03/14 2354 08/04/14 0436 08/04/14 1353  BP: 122/58  138/61 100/62  Pulse: 73  78 89  Temp: 100.2 F (37.9 C) 99.9 F (37.7 C) 97.7 F (36.5 C) 98.4 F (36.9 C)  TempSrc: Oral Oral Oral Oral  Resp: 18  18 16   Height:      Weight:      SpO2: 93%  94% 94%    Intake/Output Summary (Last 24 hours) at 08/04/14 1815 Last data filed at 08/04/14 1345  Gross per 24 hour  Intake  360 ml  Output      0 ml  Net    360 ml   Filed Weights   08/03/14 0445  Weight: 69.854 kg (154 lb)   Body mass index is 27.29 kg/(m^2).  Exam:   General:  Adult female, No acute distress, mildly confused  HEENT:  NCAT, MMM  Cardiovascular:  RRR, nl S1, S2 no mrg, 2+ pulses, warm  extremities  Respiratory:  Rales to the mid-back bilaterally, no wheezes or rhonchi, splinting  Abdomen:   NABS, soft, NT/ND  MSK:   Normal tone and bulk, no LEE  Neuro:  Grossly intact  Data Reviewed: Basic Metabolic Panel:  Recent Labs Lab 08/03/14 0446  NA 142  K 3.4*  CL 105  CO2 28  GLUCOSE 156*  BUN 10  CREATININE 0.81  CALCIUM 9.8   Liver Function Tests: No results for input(s): AST, ALT, ALKPHOS, BILITOT, PROT, ALBUMIN in the last 168 hours. No results for input(s): LIPASE, AMYLASE in the last 168 hours. No results for input(s): AMMONIA in the last 168 hours. CBC:  Recent Labs Lab 08/03/14 0446 08/04/14 0412  WBC 10.4 9.0  HGB 13.6 12.7  HCT 42.3 39.6  MCV 95.3 95.9  PLT 240 202    No results found for this or any previous visit (from the past 240 hour(s)).   Studies: Dg Chest 2 View  08/03/2014   CLINICAL DATA:  Acute onset of severe chest pain and shortness of breath. Pain radiates to the left shoulder and down the arm. Initial encounter.  EXAM: CHEST  2 VIEW  COMPARISON:  Chest radiograph performed 10/29/2013  FINDINGS: The lungs are well-aerated. There is suggestion of focal lower lobe airspace opacity on the lateral view. There is no evidence of pleural effusion or pneumothorax.  The heart is mildly enlarged. No acute osseous abnormalities are seen.  IMPRESSION: Suggestion of mild lower lobe opacity on the lateral view, concerning for mild pneumonia.   Electronically Signed   By: Garald Balding M.D.   On: 08/03/2014 05:15   Ct Angio Chest Pe W/cm &/or Wo Cm  08/03/2014   CLINICAL DATA:  Left-sided chest pain radiating to the back that began yesterday.  EXAM: CT ANGIOGRAPHY CHEST WITH CONTRAST  TECHNIQUE: Multidetector CT imaging of the chest was performed using the standard protocol during bolus administration of intravenous contrast. Multiplanar CT image reconstructions and MIPs were obtained to evaluate the vascular anatomy.  CONTRAST:  138mL OMNIPAQUE  IOHEXOL 350 MG/ML SOLN  COMPARISON:  04/06/2010.  Chest radiography same day.  FINDINGS: Pulmonary arterial opacification is good. There are no pulmonary emboli. The right lung shows mild volume loss at the base. There is a bleb in the right costophrenic angle region of the right lower lobe. On the left, there is a small effusion layering dependently. There is volume loss of the left base associated with that. This could be simple atelectasis or related to pneumonia. There is no hilar or mediastinal mass or lymphadenopathy. There is a small amount of pericardial fluid. No advanced coronary artery calcification. Small hiatal hernia in the lower mediastinum. Upper abdominal structures are otherwise unremarkable. Ordinary degenerative changes affect the thoracic spine. No evidence of fracture.  Review of the MIP images confirms the above findings.  IMPRESSION: No pulmonary emboli.  Small pleural effusion on the left layering dependently. Mild atelectasis at the lung bases that could be simple atelectasis or mild pneumonia.  Small pericardial effusion.   Electronically Signed   By: Elta Guadeloupe  Shogry M.D.   On: 08/03/2014 07:08   Dg Chest Port 1 View  08/04/2014   CLINICAL DATA:  Left-sided chest pain with deep inspiration, abnormal breath sounds  EXAM: PORTABLE CHEST - 1 VIEW  COMPARISON:  08/03/2014  FINDINGS: Limited inspiratory effect. Moderate cardiac enlargement stable. Mild vascular congestion and interstitial prominence. More focal left lower lobe consolidation. Opacity medial right lung base appears most consistent with atelectasis.  IMPRESSION: Findings suggest mild congestive heart failure with pulmonary edema. In addition to mild right lower lobe atelectasis there is retrocardiac left lower lobe consolidation which could represent pneumonia or atelectasis.   Electronically Signed   By: Skipper Cliche M.D.   On: 08/04/2014 13:55    Scheduled Meds: . acidophilus  1 capsule Oral Daily  . azithromycin  500 mg  Intravenous Q24H  . capsaicin   Topical BID  . cefTRIAXone (ROCEPHIN)  IV  1 g Intravenous Q24H  . folic acid  2 mg Oral Daily  . furosemide  40 mg Intravenous BID  . metoprolol succinate  50 mg Oral Daily  . multivitamin with minerals  1 tablet Oral Daily  . pantoprazole  40 mg Oral Daily  . pravastatin  20 mg Oral QPM  . predniSONE  10 mg Oral Q breakfast  . sodium chloride  3 mL Intravenous Q12H  . Warfarin - Pharmacist Dosing Inpatient   Does not apply q1800   Continuous Infusions:   Principal Problem:   Chest pain Active Problems:   Atrial fibrillation   Hypertension   Hyperlipidemia   CAP (community acquired pneumonia)    Time spent: 30 min    Camilia Caywood, [facility] Pager [phone]. If 7PM-7AM, please contact night-coverage at [email or web address], password [facility] 08/04/2014, 6:15 PM  LOS: 1 day

## 2014-08-04 NOTE — Progress Notes (Signed)
[facility] PROGRESS NOTE  [name] FYB:[id number] DOB: 25-Apr-1940 DOA: 08/03/2014 PCP: Lottie Dawson, MD  Brief Summary  The patient is a 74 y.o. year-old female with history of hypertension, hyperlipidemia, chronic atrial fibrillation on anticoagulation, rheumatoid arthritis, GERD who presented with severe left back pain, left axillary and chest wall pain the night prior to admission.  Pain was pleuritic and associated with mild SOB.  She denied fevers, chills, sinus congestion, sore throat. In the emergency department, CT angiogram chest demonstrated no evidence of pulmonary embolism but did demonstrate possibly pneumonia with small effusion in the LLL.  She was given hydrocodone for pain, ceftriaxone and azithromycin and was admitted for community-acquired pneumonia. Troponin was negative, and EKG demonstrated rate controlled atrial fibrillation with diffusely flattened T waves, stable from prior.  Assessment/Plan  Left lateral chest pain/left shoulder pain are likely secondary to her pneumonia.  CT was negative for pulmonary embolism and dissection.  She was hospitalized in 2012 with similar complaints and was treated with elevated prednisone doses for RF which improved her symptoms. Troponins were negative and her ECHO demonstrates no evidence of wall motion abnormalities.  She tried vicodin which made her confused.  We tried ultram next which also made her sleep and confused.  Valium helps the most.   -  Continue low dose valium and ultram -  Schedule tylenol -  K-pad -  IS  Community-acquired pneumonia, left lower lobe with low grade fever overnight. - Continue ceftriaxone and azithromycin  - Legionella and strep pneumo antigens  Bilateral rales on exam today to mid-back which may be related to pneumonia or acute diastolic heart failure -  CXR:  Pulmonary edema -  ECHO was not adequate enough to detect diastolic dysfunction -  D/c oral lasix -  Start  lasix 40mg  IV BID -  Daily weights -  Strict I/O -  Repeat electrolytes in AM  Chronic atrial fibrillation, bradycardic and mildly hypotensive on day of admission, CHADs2vasc = 3  -Coumadin dose by pharmacy -  Per conversation with cardiology, stopped dig and verapamil and halved dose of metoprolol - f/u with cardiology as outpatient  Hypertension, blood pressures low normal to hypotensive yesterday - place hold parameter on metoprolol - verapamil discontinued  GERD, stable, continue omeprazole  Rheumatoid arthritis, with possible flare given pleural effusion, pericardial effusion, continue methotrexate - Increased to prednisone 10mg  daily  Diet:  Healthy heart Access:  PIV IVF:  off Proph:  warfarin  Code Status: full Family Communication: patient and her husband Disposition Plan: to home likely Wed if pain and breathing improved.  PT/OT pending  Consultants:  none  Procedures:  CT angio chest  Antibiotics:  Ceftriaxone and azithromycin 7/18 >   HPI/Subjective:  Severe pain in the left shoulder and left chest that makes movement even in the bed difficult.  Still feels hazy or fuzzy from side effects of her medications.  Absolutely refused morphine, dilaudid, vicodin, oxycodone.     Objective: Filed Vitals:   08/03/14 2158 08/03/14 2354 08/04/14 0436 08/04/14 1353  BP: 122/58  138/61 100/62  Pulse: 73  78 89  Temp: 100.2 F (37.9 C) 99.9 F (37.7 C) 97.7 F (36.5 C) 98.4 F (36.9 C)  TempSrc: Oral Oral Oral Oral  Resp: 18  18 16   Height:      Weight:      SpO2: 93%  94% 94%    Intake/Output Summary (Last 24 hours) at 08/04/14 1834 Last data filed at 08/04/14 1345  Gross per 24 hour  Intake    360 ml  Output      0 ml  Net    360 ml   Filed Weights   08/03/14 0445  Weight: 69.854 kg (154 lb)   Body mass index is 27.29 kg/(m^2).  Exam:   General:  Adult female, No acute distress, mildly confused  HEENT:  NCAT, MMM  Cardiovascular:   RRR, nl S1, S2 no mrg, 2+ pulses, warm extremities  Respiratory:  Rales to the mid-back bilaterally, no wheezes or rhonchi, splinting  Abdomen:   NABS, soft, NT/ND  MSK:   Normal tone and bulk, no LEE  Neuro:  Grossly intact  Data Reviewed: Basic Metabolic Panel:  Recent Labs Lab 08/03/14 0446  NA 142  K 3.4*  CL 105  CO2 28  GLUCOSE 156*  BUN 10  CREATININE 0.81  CALCIUM 9.8   Liver Function Tests: No results for input(s): AST, ALT, ALKPHOS, BILITOT, PROT, ALBUMIN in the last 168 hours. No results for input(s): LIPASE, AMYLASE in the last 168 hours. No results for input(s): AMMONIA in the last 168 hours. CBC:  Recent Labs Lab 08/03/14 0446 08/04/14 0412  WBC 10.4 9.0  HGB 13.6 12.7  HCT 42.3 39.6  MCV 95.3 95.9  PLT 240 202    No results found for this or any previous visit (from the past 240 hour(s)).   Studies: Dg Chest 2 View  08/03/2014   CLINICAL DATA:  Acute onset of severe chest pain and shortness of breath. Pain radiates to the left shoulder and down the arm. Initial encounter.  EXAM: CHEST  2 VIEW  COMPARISON:  Chest radiograph performed 10/29/2013  FINDINGS: The lungs are well-aerated. There is suggestion of focal lower lobe airspace opacity on the lateral view. There is no evidence of pleural effusion or pneumothorax.  The heart is mildly enlarged. No acute osseous abnormalities are seen.  IMPRESSION: Suggestion of mild lower lobe opacity on the lateral view, concerning for mild pneumonia.   Electronically Signed   By: Garald Balding M.D.   On: 08/03/2014 05:15   Ct Angio Chest Pe W/cm &/or Wo Cm  08/03/2014   CLINICAL DATA:  Left-sided chest pain radiating to the back that began yesterday.  EXAM: CT ANGIOGRAPHY CHEST WITH CONTRAST  TECHNIQUE: Multidetector CT imaging of the chest was performed using the standard protocol during bolus administration of intravenous contrast. Multiplanar CT image reconstructions and MIPs were obtained to evaluate the  vascular anatomy.  CONTRAST:  112mL OMNIPAQUE IOHEXOL 350 MG/ML SOLN  COMPARISON:  04/06/2010.  Chest radiography same day.  FINDINGS: Pulmonary arterial opacification is good. There are no pulmonary emboli. The right lung shows mild volume loss at the base. There is a bleb in the right costophrenic angle region of the right lower lobe. On the left, there is a small effusion layering dependently. There is volume loss of the left base associated with that. This could be simple atelectasis or related to pneumonia. There is no hilar or mediastinal mass or lymphadenopathy. There is a small amount of pericardial fluid. No advanced coronary artery calcification. Small hiatal hernia in the lower mediastinum. Upper abdominal structures are otherwise unremarkable. Ordinary degenerative changes affect the thoracic spine. No evidence of fracture.  Review of the MIP images confirms the above findings.  IMPRESSION: No pulmonary emboli.  Small pleural effusion on the left layering dependently. Mild atelectasis at the lung bases that could be simple atelectasis or mild pneumonia.  Small pericardial  effusion.   Electronically Signed   By: Nelson Chimes M.D.   On: 08/03/2014 07:08   Dg Chest Port 1 View  08/04/2014   CLINICAL DATA:  Left-sided chest pain with deep inspiration, abnormal breath sounds  EXAM: PORTABLE CHEST - 1 VIEW  COMPARISON:  08/03/2014  FINDINGS: Limited inspiratory effect. Moderate cardiac enlargement stable. Mild vascular congestion and interstitial prominence. More focal left lower lobe consolidation. Opacity medial right lung base appears most consistent with atelectasis.  IMPRESSION: Findings suggest mild congestive heart failure with pulmonary edema. In addition to mild right lower lobe atelectasis there is retrocardiac left lower lobe consolidation which could represent pneumonia or atelectasis.   Electronically Signed   By: Skipper Cliche M.D.   On: 08/04/2014 13:55    Scheduled Meds: .  acetaminophen  1,000 mg Oral TID  . acidophilus  1 capsule Oral Daily  . azithromycin  500 mg Intravenous Q24H  . capsaicin   Topical BID  . cefTRIAXone (ROCEPHIN)  IV  1 g Intravenous Q24H  . folic acid  2 mg Oral Daily  . furosemide  40 mg Intravenous BID  . metoprolol succinate  50 mg Oral Daily  . multivitamin with minerals  1 tablet Oral Daily  . pantoprazole  40 mg Oral Daily  . pravastatin  20 mg Oral QPM  . predniSONE  10 mg Oral Q breakfast  . sodium chloride  3 mL Intravenous Q12H  . Warfarin - Pharmacist Dosing Inpatient   Does not apply q1800   Continuous Infusions:   Principal Problem:   Chest pain Active Problems:   Atrial fibrillation   Hypertension   Hyperlipidemia   CAP (community acquired pneumonia)    Time spent: 30 min    Jenetta Wease, [name] Pager [phone]. If 7PM-7AM, please contact night-coverage at [email or web address], password [facility] 08/04/2014, 6:34 PM  LOS: 1 day

## 2014-08-04 NOTE — Progress Notes (Signed)
Pt called RN said she couldn't breathe. I put patient on 2L oxygen nasal cannula and pt O2 sat was at 99%, Heart rate 80, BP 132/63, RR 16. Pt started to take slow deep breathes, pt has improved. 5:07 AM Carmela Hurt, RN

## 2014-08-04 NOTE — Progress Notes (Signed)
SATURATION QUALIFICATIONS: (This note is used to comply with regulatory documentation for home oxygen)  Patient Saturations on Room Air at Rest = 94%  Patient Saturations on Room Air while Ambulating = 94-95%

## 2014-08-04 NOTE — Progress Notes (Signed)
ANTICOAGULATION CONSULT NOTE - Follow Up Consult  Pharmacy Consult for Warfarin Indication: atrial fibrillation  Allergies  Allergen Reactions  . Arthrotec [Diclofenac-Misoprostol] Diarrhea  . Morphine Sulfate Other (See Comments)    Erratic behavior; hallucinations  . Gold-Containing Drug Products Rash  . Macrodantin [Nitrofurantoin Macrocrystal] Rash  . Naproxen Rash  . Penicillins Rash    amoxicillin    Patient Measurements: Height: 5\' 3"  (160 cm) Weight: 154 lb (69.854 kg) IBW/kg (Calculated) : 52.4  Vital Signs: Temp: 97.7 F (36.5 C) (07/19 0436) Temp Source: Oral (07/19 0436) BP: 138/61 mmHg (07/19 0436) Pulse Rate: 78 (07/19 0436)  Labs:  Recent Labs  08/03/14 0446 08/03/14 0935 08/03/14 1505 08/03/14 2053 08/04/14 0412  HGB 13.6  --   --   --  12.7  HCT 42.3  --   --   --  39.6  PLT 240  --   --   --  202  LABPROT 20.9*  --   --   --  23.7*  INR 1.81*  --   --   --  2.14*  CREATININE 0.81  --   --   --   --   TROPONINI  --  <0.03 <0.03 <0.03  --     Estimated Creatinine Clearance: 58 mL/min (by C-G formula based on Cr of 0.81).   Medications:  Scheduled:  . acidophilus  1 capsule Oral Daily  . azithromycin  500 mg Intravenous Q24H  . capsaicin   Topical BID  . cefTRIAXone (ROCEPHIN)  IV  1 g Intravenous Q24H  . folic acid  2 mg Oral Daily  . furosemide  40 mg Oral Daily  . metoprolol succinate  50 mg Oral Daily  . multivitamin with minerals  1 tablet Oral Daily  . pantoprazole  40 mg Oral Daily  . pravastatin  20 mg Oral QPM  . predniSONE  10 mg Oral Q breakfast  . sodium chloride  3 mL Intravenous Q12H  . warfarin  5 mg Oral ONCE-1800  . Warfarin - Pharmacist Dosing Inpatient   Does not apply q1800    Assessment: 51 yoF with HTN, HLD, GERD, RA, chronic Afib on warfarin admitted 7/18 for CAP; to continue warfarin per pharmacy while admitted   Baseline INR slightly subtherapeutic  Prior anticoagulation: warfarin 5 mg PO daily except  2.5 mg on Saturdays; last dose 7/17  Today, 08/03/2014:  CBC: wnl  INR now therapeutic after warfarin 7.5 mg x 1  Major drug interactions: MTX at home (held in hospital); azithromycin  No bleeding issues per nursing  Regular diet  Goal of Therapy: INR 2-3  Plan:  Warfarin 5 mg PO tonight at 18:00  Daily INR  CBC at least q72 hr while on warfarin  Monitor for signs of bleeding or thrombosis   Reuel Boom, PharmD, BCPS Pager: [phone] 08/04/2014, 12:10 PM

## 2014-08-05 DIAGNOSIS — J189 Pneumonia, unspecified organism: Principal | ICD-10-CM

## 2014-08-05 DIAGNOSIS — E785 Hyperlipidemia, unspecified: Secondary | ICD-10-CM

## 2014-08-05 DIAGNOSIS — I4891 Unspecified atrial fibrillation: Secondary | ICD-10-CM

## 2014-08-05 LAB — CBC
HCT: 41.2 % (ref 36.0–46.0)
Hemoglobin: 13.3 g/dL (ref 12.0–15.0)
MCH: 30.9 pg (ref 26.0–34.0)
MCHC: 32.3 g/dL (ref 30.0–36.0)
MCV: 95.6 fL (ref 78.0–100.0)
PLATELETS: 200 10*3/uL (ref 150–400)
RBC: 4.31 MIL/uL (ref 3.87–5.11)
RDW: 16.8 % — ABNORMAL HIGH (ref 11.5–15.5)
WBC: 9.6 10*3/uL (ref 4.0–10.5)

## 2014-08-05 LAB — PROTIME-INR
INR: 2.13 — ABNORMAL HIGH (ref 0.00–1.49)
Prothrombin Time: 23.7 seconds — ABNORMAL HIGH (ref 11.6–15.2)

## 2014-08-05 LAB — BASIC METABOLIC PANEL
Anion gap: 10 (ref 5–15)
BUN: 9 mg/dL (ref 6–20)
CHLORIDE: 100 mmol/L — AB (ref 101–111)
CO2: 30 mmol/L (ref 22–32)
CREATININE: 0.71 mg/dL (ref 0.44–1.00)
Calcium: 8.9 mg/dL (ref 8.9–10.3)
GFR calc Af Amer: >60 mL/min (ref >60)
Glucose, Bld: 125 mg/dL — ABNORMAL HIGH (ref 65–99)
Potassium: 3 mmol/L — ABNORMAL LOW (ref 3.5–5.1)
Sodium: 140 mmol/L (ref 135–145)

## 2014-08-05 MED ORDER — POTASSIUM CHLORIDE CRYS ER 20 MEQ PO TBCR
40.0000 meq | EXTENDED_RELEASE_TABLET | Freq: Once | ORAL | Status: AC
  Administered 2014-08-05: 40 meq via ORAL
  Filled 2014-08-05: qty 2

## 2014-08-05 MED ORDER — WARFARIN SODIUM 5 MG PO TABS
5.0000 mg | ORAL_TABLET | Freq: Once | ORAL | Status: AC
  Administered 2014-08-05: 5 mg via ORAL
  Filled 2014-08-05: qty 1

## 2014-08-05 MED ORDER — FUROSEMIDE 40 MG PO TABS
40.0000 mg | ORAL_TABLET | Freq: Every day | ORAL | Status: DC
  Administered 2014-08-06: 40 mg via ORAL
  Filled 2014-08-05: qty 1

## 2014-08-05 NOTE — Progress Notes (Signed)
ANTICOAGULATION CONSULT NOTE - Follow Up Consult  Pharmacy Consult for Warfarin Indication: atrial fibrillation  Allergies  Allergen Reactions  . Arthrotec [Diclofenac-Misoprostol] Diarrhea  . Morphine Sulfate Other (See Comments)    Erratic behavior; hallucinations  . Gold-Containing Drug Products Rash  . Macrodantin [Nitrofurantoin Macrocrystal] Rash  . Naproxen Rash  . Penicillins Rash    amoxicillin    Patient Measurements: Height: 5\' 3"  (160 cm) Weight: 154 lb 3.2 oz (69.945 kg) IBW/kg (Calculated) : 52.4  Vital Signs: Temp: 98.4 F (36.9 C) (07/20 0630) Temp Source: Oral (07/20 0630) BP: 129/64 mmHg (07/20 1003) Pulse Rate: 68 (07/20 1003)  Labs:  Recent Labs  08/03/14 0446 08/03/14 0935 08/03/14 1505 08/03/14 2053 08/04/14 0412 08/05/14 0424  HGB 13.6  --   --   --  12.7 13.3  HCT 42.3  --   --   --  39.6 41.2  PLT 240  --   --   --  202 200  LABPROT 20.9*  --   --   --  23.7* 23.7*  INR 1.81*  --   --   --  2.14* 2.13*  CREATININE 0.81  --   --   --   --  0.71  TROPONINI  --  <0.03 <0.03 <0.03  --   --     Estimated Creatinine Clearance: 58.7 mL/min (by C-G formula based on Cr of 0.71).   Medications:  Scheduled:  . acetaminophen  1,000 mg Oral TID  . acidophilus  1 capsule Oral Daily  . azithromycin  500 mg Intravenous Q24H  . capsaicin   Topical BID  . cefTRIAXone (ROCEPHIN)  IV  1 g Intravenous Q24H  . folic acid  2 mg Oral Daily  . furosemide  40 mg Intravenous BID  . metoprolol succinate  50 mg Oral Daily  . multivitamin with minerals  1 tablet Oral Daily  . pantoprazole  40 mg Oral Daily  . pravastatin  20 mg Oral QPM  . predniSONE  10 mg Oral Q breakfast  . sodium chloride  3 mL Intravenous Q12H  . Warfarin - Pharmacist Dosing Inpatient   Does not apply q1800    Assessment: 76 yoF with HTN, HLD, GERD, RA, chronic Afib on warfarin admitted 7/18 for CAP; to continue warfarin per pharmacy while admitted   Baseline INR slightly  subtherapeutic  Prior anticoagulation: warfarin 5 mg PO daily except 2.5 mg on Saturdays; last dose 7/17  Today, 08/03/2014:  CBC: stable wnl  INR therapeutic  Major drug interactions: MTX at home (held in hospital); azithromycin  No bleeding issues per nursing  Regular diet; eating 100% of meals  Goal of Therapy: INR 2-3  Plan:  Warfarin 5 mg PO tonight at 18:00  Daily INR  CBC at least q72 hr while on warfarin  Monitor for signs of bleeding or thrombosis   Reuel Boom, PharmD, BCPS Pager: [phone] 08/05/2014, 10:23 AM

## 2014-08-05 NOTE — Progress Notes (Signed)
[facility] PROGRESS NOTE  SADIE HAZELETT ACZ:[id number] DOB: [birth year] DOA: 08/03/2014 PCP: Lottie Dawson, MD  Brief Summary  The patient is a 74 y.o. year-old female with history of hypertension, hyperlipidemia, chronic atrial fibrillation on anticoagulation, rheumatoid arthritis, GERD who presented with severe left back pain, left axillary and chest wall pain the night prior to admission.  Pain was pleuritic and associated with mild SOB.  She denied fevers, chills, sinus congestion, sore throat. In the emergency department, CT angiogram chest demonstrated no evidence of pulmonary embolism but did demonstrate possibly pneumonia with small effusion in the LLL.  She was given hydrocodone for pain, ceftriaxone and azithromycin and was admitted for community-acquired pneumonia. Troponin was negative, and EKG demonstrated rate controlled atrial fibrillation with diffusely flattened T waves, stable from prior.  Assessment/Plan  Left lateral chest pain/left shoulder pain  - Continue supportive therapy, most likely related to pleuritic discomfort versus musculoskeletal - Troponins negative  Community-acquired pneumonia, left lower lobe with low grade fever overnight. - Continue ceftriaxone and azithromycin, we'll plan on discharging on oral cephalexin for an and oral azithromycin most likely next a.m. with continued improvement   Chronic atrial fibrillation, bradycardic and mildly hypotensive on day of admission, CHADs2vasc = 3  -Coumadin dose by pharmacy -  Continue current regimen  Hypertension, blood pressures low normal to hypotensive yesterday - place hold parameter on metoprolol - verapamil discontinued  Hypokalemia - Will administer K dur - Reassess next a.m.  GERD, stable, continue omeprazole  Rheumatoid arthritis, with possible flare given pleural effusion, pericardial effusion, continue methotrexate - Increased to prednisone 10mg  daily  Diet:   Healthy heart Access:  PIV IVF:  off Proph:  warfarin  Code Status: full Family Communication: patient and her husband Disposition Plan: With continued improvement in respiratory condition will plan on discharge and next a.m.  Consultants:  none  Procedures:  CT angio chest  Antibiotics:  Ceftriaxone and azithromycin 7/18 >   HPI/Subjective: Has no new complaints. No acute issues overnight.  Objective: Filed Vitals:   08/04/14 2129 08/05/14 0630 08/05/14 1003 08/05/14 1300  BP: 107/93 135/73 129/64 116/68  Pulse: 98 73 68 80  Temp: 98.5 F (36.9 C) 98.4 F (36.9 C)  97.5 F (36.4 C)  TempSrc: Oral Oral  Oral  Resp: 16 16  16   Height:      Weight:  69.945 kg (154 lb 3.2 oz)    SpO2: 93% 94%  96%    Intake/Output Summary (Last 24 hours) at 08/05/14 1724 Last data filed at 08/05/14 0800  Gross per 24 hour  Intake    240 ml  Output      0 ml  Net    240 ml   Filed Weights   08/03/14 0445 08/05/14 0630  Weight: 69.854 kg (154 lb) 69.945 kg (154 lb 3.2 oz)   Body mass index is 27.32 kg/(m^2).  Exam:   General:  Adult female, No acute distress, mildly confused  HEENT:  NCAT, MMM  Cardiovascular:  RRR, nl S1, S2 no mrg, 2+ pulses, warm extremities  Respiratory:  Rales to the mid-back bilaterally, no wheezes or rhonchi, splinting  Abdomen:   NABS, soft, NT/ND  MSK:   Normal tone and bulk, no LEE  Neuro:  Grossly intact  Data Reviewed: Basic Metabolic Panel:  Recent Labs Lab 08/03/14 0446 08/05/14 0424  NA 142 140  K 3.4* 3.0*  CL 105 100*  CO2 28 30  GLUCOSE 156* 125*  BUN 10 9  CREATININE 0.81 0.71  CALCIUM 9.8 8.9   Liver Function Tests: No results for input(s): AST, ALT, ALKPHOS, BILITOT, PROT, ALBUMIN in the last 168 hours. No results for input(s): LIPASE, AMYLASE in the last 168 hours. No results for input(s): AMMONIA in the last 168 hours. CBC:  Recent Labs Lab 08/03/14 0446 08/04/14 0412 08/05/14 0424  WBC 10.4 9.0 9.6   HGB 13.6 12.7 13.3  HCT 42.3 39.6 41.2  MCV 95.3 95.9 95.6  PLT 240 202 200    No results found for this or any previous visit (from the past 240 hour(s)).   Studies: Dg Chest Port 1 View  08/04/2014   CLINICAL DATA:  Left-sided chest pain with deep inspiration, abnormal breath sounds  EXAM: PORTABLE CHEST - 1 VIEW  COMPARISON:  08/03/2014  FINDINGS: Limited inspiratory effect. Moderate cardiac enlargement stable. Mild vascular congestion and interstitial prominence. More focal left lower lobe consolidation. Opacity medial right lung base appears most consistent with atelectasis.  IMPRESSION: Findings suggest mild congestive heart failure with pulmonary edema. In addition to mild right lower lobe atelectasis there is retrocardiac left lower lobe consolidation which could represent pneumonia or atelectasis.   Electronically Signed   By: Skipper Cliche M.D.   On: 08/04/2014 13:55    Scheduled Meds: . acetaminophen  1,000 mg Oral TID  . acidophilus  1 capsule Oral Daily  . azithromycin  500 mg Intravenous Q24H  . capsaicin   Topical BID  . cefTRIAXone (ROCEPHIN)  IV  1 g Intravenous Q24H  . folic acid  2 mg Oral Daily  . furosemide  40 mg Intravenous BID  . metoprolol succinate  50 mg Oral Daily  . multivitamin with minerals  1 tablet Oral Daily  . pantoprazole  40 mg Oral Daily  . pravastatin  20 mg Oral QPM  . predniSONE  10 mg Oral Q breakfast  . sodium chloride  3 mL Intravenous Q12H  . warfarin  5 mg Oral ONCE-1800  . Warfarin - Pharmacist Dosing Inpatient   Does not apply q1800   Continuous Infusions:   Time spent: 35 min   Velvet Bathe  [facility] Pager [phone] If 7PM-7AM, please contact night-coverage at [email or web address], password [facility] 08/05/2014, 5:24 PM  LOS: 2 days

## 2014-08-05 NOTE — Evaluation (Signed)
Occupational Therapy Evaluation Patient Details Name: [name] MRN: [id number] DOB: 03-Jan-1941 Today's Date: 08/05/2014    History of Present Illness 74 yo female admitted with chest pain, pna. Hx of P Afib, RA, osteopenia, HTN   Clinical Impression   Pt was admitted for the above.  At baseline, she is independent with all ADLs, driving and works part time.  She will benefit from skilled OT to increase safety and independence with adls.  Goals in acute are for mod I.  She currently needs min A to min guard.    Follow Up Recommendations  Supervision/Assistance - 24 hour    Equipment Recommendations  None recommended by OT    Recommendations for Other Services       Precautions / Restrictions Precautions Precautions: Fall Restrictions Weight Bearing Restrictions: No      Mobility Bed Mobility Overal bed mobility: Needs Assistance Bed Mobility: Supine to Sit;Sit to Supine     Supine to sit: Supervision Sit to supine: Supervision   General bed mobility comments: HOB raised  Transfers Overall transfer level: Needs assistance   Transfers: Sit to/from Stand Sit to Stand: Min guard         General transfer comment: close guard for safety    Balance Overall balance assessment: Needs assistance         Standing balance support: No upper extremity supported;During functional activity Standing balance-Leahy Scale: Fair                              ADL Overall ADL's : Needs assistance/impaired             Lower Body Bathing: Min guard;Sit to/from stand                         General ADL Comments: Pt needs occasional min A for balance when ambulating to bathroom. She reports that she had pain medication and that she feels a little "off".  She was able to don socks sitting.  Husband has been assisting her to bathroom and she feels she will be OK without DME at home. She is agreeable to sponge bathing initially until she feels  that she gets her srength/balance back.  She is aware of the energy needed to get up from bottom of tub.  Pt did feel flushed after session and she reports that this happens even before her admission here:  spoke to RN, Ify.  No increased pain     Vision     Perception     Praxis      Pertinent Vitals/Pain Pain Assessment: Faces Faces Pain Scale: Hurts even more Pain Location: L chest/axilla Pain Descriptors / Indicators: Sore Pain Intervention(s): Limited activity within patient's tolerance;Monitored during session;Premedicated before session;Repositioned     Hand Dominance     Extremity/Trunk Assessment Upper Extremity Assessment Upper Extremity Assessment: LUE deficits/detail LUE Deficits / Details: painful axilla and chest wall.  She is able to move approximately 30 degrees without increased pain   Lower Extremity Assessment Lower Extremity Assessment: Generalized weakness   Cervical / Trunk Assessment Cervical / Trunk Assessment: Kyphotic   Communication Communication Communication: No difficulties   Cognition Arousal/Alertness: Awake/alert Behavior During Therapy: WFL for tasks assessed/performed Overall Cognitive Status: Within Functional Limits for tasks assessed                     General Comments  Exercises       Shoulder Instructions      Home Living Family/patient expects to be discharged to:: Private residence Living Arrangements: Spouse/significant other Available Help at Discharge: Family Type of Home: House Home Access: Stairs to enter Technical brewer of Steps: 1 Entrance Stairs-Rails: None Home Layout: One level     Bathroom Shower/Tub: Tub/shower unit Shower/tub characteristics: Architectural technologist: Standard     Home Equipment: None   Additional Comments: pt sits at bottom of tub      Prior Functioning/Environment Level of Independence: Independent        Comments: 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  She is a retired Therapist, sports    OT Diagnosis: Acute pain;Generalized weakness   OT Problem List: Decreased strength;Decreased activity tolerance;Impaired balance (sitting and/or standing);Pain   OT Treatment/Interventions: Self-care/ADL training;DME and/or AE instruction;Patient/family education;Balance training    OT Goals(Current goals can be found in the care plan section) Acute Rehab OT Goals Patient Stated Goal: get better and get back to [facility] clients OT Goal Formulation: With patient Time For Goal Achievement: 08/12/14 Potential to Achieve Goals: Good ADL Goals Pt Will Transfer to Toilet: with modified independence;ambulating;regular height toilet Additional ADL Goal #1: pt will perform ADL at mod I level  OT Frequency: Min 2X/week   Barriers to D/C:            Co-evaluation              End of Session    Activity Tolerance: No increased pain;Patient limited by fatigue Patient left: in bed;with call bell/phone within reach;with family/visitor present   Time: 4827-0786 OT Time Calculation (min): 18 min Charges:  OT General Charges $OT Visit: 1 Procedure OT Evaluation $Initial OT Evaluation Tier I: 1 Procedure G-Codes:    Dacota Ruben 2014-08-17, 2:02 PM Lesle Chris, OTR/L [phone] 08/17/14

## 2014-08-05 NOTE — Evaluation (Signed)
Physical Therapy Evaluation Patient Details Name: [name] MRN: [id number] DOB: 04/02/1940 Today's Date: 08/05/2014   History of Present Illness  74 yo female admitted with chest pain, pna. Hx of P Afib, RA, osteopenia, HTN  Clinical Impression  On eval, pt required Min assist for mobility-walked ~150 feet in hallway. Pt is unsteady. Still c/o L chest/side/back pain which limits mobility somewhat. Husband present and assisted pt to and from bathroom. Recommend HHPT at discharge.     Follow Up Recommendations Home health PT;Supervision for mobility/OOB    Equipment Recommendations  None recommended by PT    Recommendations for Other Services OT consult     Precautions / Restrictions Precautions Precautions: Fall Restrictions Weight Bearing Restrictions: No      Mobility  Bed Mobility Overal bed mobility: Needs Assistance Bed Mobility: Supine to Sit;Sit to Supine     Supine to sit: Supervision Sit to supine: Supervision   General bed mobility comments: for safety  Transfers Overall transfer level: Needs assistance   Transfers: Sit to/from Stand Sit to Stand: Min assist         General transfer comment: Assist to rise, stabilize, control descent.   Ambulation/Gait Ambulation/Gait assistance: Min assist Ambulation Distance (Feet): 150 Feet Assistive device: None Gait Pattern/deviations: Step-through pattern;Staggering right;Staggering left;Drifts right/left;Decreased stride length     General Gait Details: Varied between very close guarding to intermittent min assist to stabilize. Pt is unsteady. Dyspnea 2/4. O2 sats 97% on RA.   Stairs            Wheelchair Mobility    Modified Rankin (Stroke Patients Only)       Balance Overall balance assessment: Needs assistance         Standing balance support: No upper extremity supported;During functional activity Standing balance-Leahy Scale: Fair                                Pertinent Vitals/Pain Pain Assessment: Faces Faces Pain Scale: Hurts whole lot Pain Location: L chest, side, back Pain Descriptors / Indicators: Sore Pain Intervention(s): Monitored during session;Repositioned    Home Living Family/patient expects to be discharged to:: Private residence Living Arrangements: Spouse/significant other Available Help at Discharge: Family Type of Home: House Home Access: Stairs to enter Entrance Stairs-Rails: None Technical brewer of Steps: 1 Home Layout: One level Home Equipment: None      Prior Function Level of Independence: Independent               Hand Dominance        Extremity/Trunk Assessment   Upper Extremity Assessment: Defer to OT evaluation           Lower Extremity Assessment: Generalized weakness      Cervical / Trunk Assessment: Kyphotic  Communication   Communication: No difficulties  Cognition Arousal/Alertness: Awake/alert Behavior During Therapy: WFL for tasks assessed/performed Overall Cognitive Status: Within Functional Limits for tasks assessed                      General Comments      Exercises        Assessment/Plan    PT Assessment Patient needs continued PT services  PT Diagnosis Difficulty walking;Generalized weakness;Acute pain   PT Problem List Decreased strength;Decreased activity tolerance;Decreased balance;Decreased mobility;Pain  PT Treatment Interventions DME instruction;Gait training;Functional mobility training;Therapeutic activities;Therapeutic exercise;Patient/family education;Balance training   PT Goals (Current goals can be found in the  Care Plan section) Acute Rehab PT Goals Patient Stated Goal: less pain. regain mobility and independence PT Goal Formulation: With patient/family Time For Goal Achievement: 08/12/14 Potential to Achieve Goals: Good    Frequency Min 3X/week   Barriers to discharge        Co-evaluation               End of  Session Equipment Utilized During Treatment: Gait belt Activity Tolerance: Patient limited by pain Patient left: in bed;with call bell/phone within reach;with family/visitor present           Time: 2505-3976 PT Time Calculation (min) (ACUTE ONLY): 14 min   Charges:   PT Evaluation $Initial PT Evaluation Tier I: 1 Procedure     PT G Codes:        Weston Anna, MPT Pager: [phone]

## 2014-08-05 NOTE — Care Management Important Message (Signed)
Important Message  Patient Details  Name: [name] MRN: [id number] Date of Birth: March 10, 1940   Medicare Important Message Given:  Yes-second notification given    Camillo Flaming 08/05/2014, 12:25 [place] Message  Patient Details  Name: [name] MRN: [id number] Date of Birth: 1941/01/12   Medicare Important Message Given:  Yes-second notification given    Camillo Flaming 08/05/2014, 12:25 PM

## 2014-08-06 DIAGNOSIS — I10 Essential (primary) hypertension: Secondary | ICD-10-CM

## 2014-08-06 LAB — BASIC METABOLIC PANEL
Anion gap: 8 (ref 5–15)
BUN: 12 mg/dL (ref 6–20)
CHLORIDE: 102 mmol/L (ref 101–111)
CO2: 29 mmol/L (ref 22–32)
Calcium: 9.1 mg/dL (ref 8.9–10.3)
Creatinine, Ser: 0.73 mg/dL (ref 0.44–1.00)
GFR calc non Af Amer: >60 mL/min (ref >60)
Glucose, Bld: 123 mg/dL — ABNORMAL HIGH (ref 65–99)
POTASSIUM: 3.4 mmol/L — AB (ref 3.5–5.1)
SODIUM: 139 mmol/L (ref 135–145)

## 2014-08-06 LAB — PROTIME-INR
INR: 2.29 — ABNORMAL HIGH (ref 0.00–1.49)
Prothrombin Time: 25 seconds — ABNORMAL HIGH (ref 11.6–15.2)

## 2014-08-06 LAB — CBC
HCT: 40.8 % (ref 36.0–46.0)
Hemoglobin: 13.1 g/dL (ref 12.0–15.0)
MCH: 30.1 pg (ref 26.0–34.0)
MCHC: 32.1 g/dL (ref 30.0–36.0)
MCV: 93.8 fL (ref 78.0–100.0)
Platelets: 235 10*3/uL (ref 150–400)
RBC: 4.35 MIL/uL (ref 3.87–5.11)
RDW: 16.5 % — AB (ref 11.5–15.5)
WBC: 9.3 10*3/uL (ref 4.0–10.5)

## 2014-08-06 MED ORDER — WARFARIN SODIUM 5 MG PO TABS
5.0000 mg | ORAL_TABLET | Freq: Once | ORAL | Status: AC
  Administered 2014-08-06: 5 mg via ORAL
  Filled 2014-08-06: qty 1

## 2014-08-06 MED ORDER — TRAMADOL HCL 50 MG PO TABS: 50.0000 mg | ORAL_TABLET | Freq: Four times a day (QID) | ORAL | Status: DC | PRN

## 2014-08-06 MED ORDER — CEFDINIR 300 MG PO CAPS
300.0000 mg | ORAL_CAPSULE | Freq: Two times a day (BID) | ORAL | Status: DC
Start: 2014-08-06 — End: 2014-09-10

## 2014-08-06 MED ORDER — METOPROLOL SUCCINATE ER 50 MG PO TB24: 50.0000 mg | ORAL_TABLET | Freq: Every day | ORAL | Status: DC

## 2014-08-06 NOTE — Discharge Summary (Signed)
Physician Discharge Summary  RANAY KETTER MGN:[id number] DOB: 13-Jan-1941 DOA: 08/03/2014  PCP: Lottie Dawson, MD  Admit date: 08/03/2014 Discharge date: 08/06/2014  Time spent: > 35 minutes  Recommendations for Outpatient Follow-up:  1. Monitor K levels 2. Continue to monitor INR levels 3. Monitor blood sugars and consider obtaining hgba1c levels.  Discharge Diagnoses:  Principal Problem:   Chest pain Active Problems:   Atrial fibrillation   Hypertension   Hyperlipidemia   CAP (community acquired pneumonia)   Discharge Condition: stable  Diet recommendation: heart healthy  Filed Weights   08/03/14 0445 08/05/14 0630 08/06/14 0438  Weight: 69.854 kg (154 lb) 69.945 kg (154 lb 3.2 oz) 69.673 kg (153 lb 9.6 oz)    History of present illness:  The patient is a 74 y.o. year-old female with history of hypertension, hyperlipidemia, chronic atrial fibrillation on anticoagulation, rheumatoid arthritis, GERD who presented with severe left back pain, left axillary and chest wall pain the night prior to admission. Pain was pleuritic and associated with mild SOB. She denied fevers, chills, sinus congestion, sore throat. In the emergency department, CT angiogram chest demonstrated no evidence of pulmonary embolism but did demonstrate possibly pneumonia with small effusion in the LLL. She was given hydrocodone for pain, ceftriaxone and azithromycin and was admitted for community-acquired pneumonia. Troponin was negative, and EKG demonstrated rate controlled atrial fibrillation with diffusely flattened T waves, stable from prior.  Hospital Course:  Left lateral chest pain/left shoulder pain  - d/c on tramadol, most likely related to pleuritic discomfort versus musculoskeletal - Troponins negative   Community-acquired pneumonia, left lower lobe with low grade fever overnight. - Continue oral cephalosporin for 3 more days to complete a 7 day treatment course.  Chronic  atrial fibrillation, bradycardic and mildly hypotensive on day of admission, CHADs2vasc = 3  -Continue home Coumadin regimen prior to admission   Hypertension - continue metoprolol - verapamil discontinued due to well controlled values on metoprolol  Hypokalemia - near normal levels suspect continued improvement in improvement in oral intake.  GERD - stable, continue omeprazole  Rheumatoid arthritis - Held her rheumatoid arthritis medications while patient has active infection. Will defer to pcp or rheumatologist when to continue.  Procedures:  None  Consultations:  None  Discharge Exam: Filed Vitals:   08/06/14 0438  BP: 121/77  Pulse: 79  Temp: 97.6 F (36.4 C)  Resp: 16    General: pt in nad, alert and awake Cardiovascular: s1 and s2 wnl, no rubs Respiratory: no increased wob on room air, equal chest rise, no wheezes  Discharge Instructions   Discharge Instructions    Call MD for:  difficulty breathing, headache or visual disturbances    Complete by:  As directed      Call MD for:  redness, tenderness, or signs of infection (pain, swelling, redness, odor or green/yellow discharge around incision site)    Complete by:  As directed      Call MD for:  severe uncontrolled pain    Complete by:  As directed      Call MD for:  temperature >100.4    Complete by:  As directed      Diet - low sodium heart healthy    Complete by:  As directed      Discharge instructions    Complete by:  As directed   Please follow up with your pcp in 1-2 weeks or sooner should any new concerns arise.     Increase activity slowly  Complete by:  As directed           Current Discharge Medication List    START taking these medications   Details  cefdinir (OMNICEF) 300 MG capsule Take 1 capsule (300 mg total) by mouth 2 (two) times daily. Qty: 6 capsule, Refills: 0    traMADol (ULTRAM) 50 MG tablet Take 1 tablet (50 mg total) by mouth every 6 (six) hours as needed for  severe pain. Qty: 30 tablet, Refills: 0      CONTINUE these medications which have CHANGED   Details  metoprolol succinate (TOPROL-XL) 50 MG 24 hr tablet Take 1 tablet (50 mg total) by mouth daily. Take with or immediately following a meal. Qty: 30 tablet, Refills: 0      CONTINUE these medications which have NOT CHANGED   Details  acidophilus (RISAQUAD) CAPS capsule Take 1 capsule by mouth daily.    cholecalciferol (VITAMIN D) 1000 UNITS tablet Take 2,000 Units by mouth daily.    fluticasone (FLONASE) 50 MCG/ACT nasal spray Place 2 sprays into both nostrils daily as needed for allergies or rhinitis.     folic acid (FOLVITE) 1 MG tablet Take 2 mg by mouth daily.     furosemide (LASIX) 40 MG tablet 1 tablet by mouth daily or as directed Qty: 30 tablet, Refills: 5    Multiple Vitamins-Minerals (MULTIVITAMIN WITH MINERALS) tablet Take 1 tablet by mouth daily.    omeprazole (PRILOSEC) 20 MG capsule Take 20 mg by mouth every morning.     pravastatin (PRAVACHOL) 20 MG tablet TAKE 1 TABLET EVERY EVENING Qty: 30 tablet, Refills: 11    predniSONE (DELTASONE) 5 MG tablet Take 2.5 mg by mouth daily with breakfast.     warfarin (COUMADIN) 5 MG tablet Take 1 tablet by mouth daily or as directed Qty: 30 tablet, Refills: 3      STOP taking these medications     digoxin (LANOXIN) 0.125 MG tablet      etanercept (ENBREL) 50 MG/ML injection      methotrexate (RHEUMATREX) 2.5 MG tablet      verapamil (CALAN-SR) 120 MG CR tablet        Allergies  Allergen Reactions  . Arthrotec [Diclofenac-Misoprostol] Diarrhea  . Morphine Sulfate Other (See Comments)    Erratic behavior; hallucinations  . Gold-Containing Drug Products Rash  . Macrodantin [Nitrofurantoin Macrocrystal] Rash  . Naproxen Rash  . Penicillins Rash    amoxicillin   Follow-up Information    Follow up with Campbell Lerner, MD On 08/18/2014.   Specialty:  Rheumatology   Contact information:   [facility] [address] [place] [address] [phone]        The results of significant diagnostics from this hospitalization (including imaging, microbiology, ancillary and laboratory) are listed below for reference.    Significant Diagnostic Studies: Dg Chest 2 View  08/03/2014   CLINICAL DATA:  Acute onset of severe chest pain and shortness of breath. Pain radiates to the left shoulder and down the arm. Initial encounter.  EXAM: CHEST  2 VIEW  COMPARISON:  Chest radiograph performed 10/29/2013  FINDINGS: The lungs are well-aerated. There is suggestion of focal lower lobe airspace opacity on the lateral view. There is no evidence of pleural effusion or pneumothorax.  The heart is mildly enlarged. No acute osseous abnormalities are seen.  IMPRESSION: Suggestion of mild lower lobe opacity on the lateral view, concerning for mild pneumonia.   Electronically Signed   By: Jacqulynn Cadet  Chang M.D.   On: 08/03/2014 05:15   Ct Angio Chest Pe W/cm &/or Wo Cm  08/03/2014   CLINICAL DATA:  Left-sided chest pain radiating to the back that began yesterday.  EXAM: CT ANGIOGRAPHY CHEST WITH CONTRAST  TECHNIQUE: Multidetector CT imaging of the chest was performed using the standard protocol during bolus administration of intravenous contrast. Multiplanar CT image reconstructions and MIPs were obtained to evaluate the vascular anatomy.  CONTRAST:  171mL OMNIPAQUE IOHEXOL 350 MG/ML SOLN  COMPARISON:  04/06/2010.  Chest radiography same day.  FINDINGS: Pulmonary arterial opacification is good. There are no pulmonary emboli. The right lung shows mild volume loss at the base. There is a bleb in the right costophrenic angle region of the right lower lobe. On the left, there is a small effusion layering dependently. There is volume loss of the left base associated with that. This could be simple atelectasis or related to pneumonia. There is no hilar or mediastinal mass or lymphadenopathy. There is a  small amount of pericardial fluid. No advanced coronary artery calcification. Small hiatal hernia in the lower mediastinum. Upper abdominal structures are otherwise unremarkable. Ordinary degenerative changes affect the thoracic spine. No evidence of fracture.  Review of the MIP images confirms the above findings.  IMPRESSION: No pulmonary emboli.  Small pleural effusion on the left layering dependently. Mild atelectasis at the lung bases that could be simple atelectasis or mild pneumonia.  Small pericardial effusion.   Electronically Signed   By: Nelson Chimes M.D.   On: 08/03/2014 07:08   Dg Chest Port 1 View  08/04/2014   CLINICAL DATA:  Left-sided chest pain with deep inspiration, abnormal breath sounds  EXAM: PORTABLE CHEST - 1 VIEW  COMPARISON:  08/03/2014  FINDINGS: Limited inspiratory effect. Moderate cardiac enlargement stable. Mild vascular congestion and interstitial prominence. More focal left lower lobe consolidation. Opacity medial right lung base appears most consistent with atelectasis.  IMPRESSION: Findings suggest mild congestive heart failure with pulmonary edema. In addition to mild right lower lobe atelectasis there is retrocardiac left lower lobe consolidation which could represent pneumonia or atelectasis.   Electronically Signed   By: Skipper Cliche M.D.   On: 08/04/2014 13:55    Microbiology: No results found for this or any previous visit (from the past 240 hour(s)).   Labs: Basic Metabolic Panel:  Recent Labs Lab 08/03/14 0446 08/05/14 0424 08/06/14 0422  NA 142 140 139  K 3.4* 3.0* 3.4*  CL 105 100* 102  CO2 28 30 29   GLUCOSE 156* 125* 123*  BUN 10 9 12   CREATININE 0.81 0.71 0.73  CALCIUM 9.8 8.9 9.1   Liver Function Tests: No results for input(s): AST, ALT, ALKPHOS, BILITOT, PROT, ALBUMIN in the last 168 hours. No results for input(s): LIPASE, AMYLASE in the last 168 hours. No results for input(s): AMMONIA in the last 168 hours. CBC:  Recent Labs Lab  08/03/14 0446 08/04/14 0412 08/05/14 0424 08/06/14 0422  WBC 10.4 9.0 9.6 9.3  HGB 13.6 12.7 13.3 13.1  HCT 42.3 39.6 41.2 40.8  MCV 95.3 95.9 95.6 93.8  PLT 240 202 200 235   Cardiac Enzymes:  Recent Labs Lab 08/03/14 0935 08/03/14 1505 08/03/14 2053  TROPONINI <0.03 <0.03 <0.03   BNP: BNP (last 3 results) No results for input(s): BNP in the last 8760 hours.  ProBNP (last 3 results)  Recent Labs  10/29/13 1803  PROBNP 366.6*    CBG: No results for input(s): GLUCAP in the last 168 hours.  Signed:  Velvet Bathe  [facility] 08/06/2014, 4:20 PM

## 2014-08-06 NOTE — Progress Notes (Signed)
ANTICOAGULATION CONSULT NOTE - Follow Up Consult  Pharmacy Consult for Warfarin Indication: atrial fibrillation  Allergies  Allergen Reactions  . Arthrotec [Diclofenac-Misoprostol] Diarrhea  . Morphine Sulfate Other (See Comments)    Erratic behavior; hallucinations  . Gold-Containing Drug Products Rash  . Macrodantin [Nitrofurantoin Macrocrystal] Rash  . Naproxen Rash  . Penicillins Rash    amoxicillin    Patient Measurements: Height: 5\' 3"  (160 cm) Weight: 153 lb 9.6 oz (69.673 kg) IBW/kg (Calculated) : 52.4  Vital Signs: Temp: 97.6 F (36.4 C) (07/21 0438) Temp Source: Oral (07/21 0438) BP: 121/77 mmHg (07/21 0438) Pulse Rate: 79 (07/21 0438)  Labs:  Recent Labs  08/03/14 1505 08/03/14 2053  08/04/14 0412 08/05/14 0424 08/06/14 0422  HGB  --   --   < > 12.7 13.3 13.1  HCT  --   --   --  39.6 41.2 40.8  PLT  --   --   --  202 200 235  LABPROT  --   --   --  23.7* 23.7* 25.0*  INR  --   --   --  2.14* 2.13* 2.29*  CREATININE  --   --   --   --  0.71 0.73  TROPONINI <0.03 <0.03  --   --   --   --   < > = values in this interval not displayed.  Estimated Creatinine Clearance: 58.6 mL/min (by C-G formula based on Cr of 0.73).   Medications:  Scheduled:  . acetaminophen  1,000 mg Oral TID  . acidophilus  1 capsule Oral Daily  . azithromycin  500 mg Intravenous Q24H  . capsaicin   Topical BID  . cefTRIAXone (ROCEPHIN)  IV  1 g Intravenous Q24H  . folic acid  2 mg Oral Daily  . furosemide  40 mg Intravenous BID  . furosemide  40 mg Oral Daily  . metoprolol succinate  50 mg Oral Daily  . multivitamin with minerals  1 tablet Oral Daily  . pantoprazole  40 mg Oral Daily  . pravastatin  20 mg Oral QPM  . predniSONE  10 mg Oral Q breakfast  . sodium chloride  3 mL Intravenous Q12H  . Warfarin - Pharmacist Dosing Inpatient   Does not apply q1800    Assessment: 83 yoF with HTN, HLD, GERD, RA, chronic Afib on warfarin admitted 7/18 for CAP; to continue  warfarin per pharmacy while admitted   Baseline INR slightly subtherapeutic  Prior anticoagulation: warfarin 5 mg PO daily except 2.5 mg on Saturdays; last dose 7/17  Today, 08/03/2014:  CBC: stable wnl  INR therapeutic  Major drug interactions: MTX at home (held in hospital); azithromycin; to d/c on Keflex  No bleeding issues per nursing  Regular diet; eating 100% of meals  Goal of Therapy: INR 2-3  Plan:  Warfarin 5 mg PO tonight at 18:00  For discharge, would resume patient's prior regimen of 5 mg PO daily except 2.5 mg on Saturdays  Recommend rechecking INR in ~1 week given recent antibiotics  Daily INR  CBC at least q72 hr while on warfarin  Monitor for signs of bleeding or thrombosis   Reuel Boom, PharmD, BCPS Pager: [phone] 08/06/2014, 10:11 AM

## 2014-08-06 NOTE — Progress Notes (Signed)
Physical Therapy Treatment Patient Details Name: RENATA GAMBINO MRN: [id number] DOB: 1940-10-10 Today's Date: 08/06/2014    History of Present Illness 74 yo female admitted with chest pain, pna. Hx of P Afib, RA, osteopenia, HTN    PT Comments    Pt appears weaker, less steady on today. Pt reports not feeling as well today. Min assist for mobility. Encouraged pt to consider use of RW for ambulation-pt agreeable.   Follow Up Recommendations  Home health PT;Supervision/Assistance - 24 hour     Equipment Recommendations  Rolling walker with 5" wheels    Recommendations for Other Services OT consult     Precautions / Restrictions Precautions Precautions: Fall Restrictions Weight Bearing Restrictions: No    Mobility  Bed Mobility Overal bed mobility: Needs Assistance Bed Mobility: Supine to Sit;Sit to Supine     Supine to sit: Supervision;HOB elevated Sit to supine: Supervision;HOB elevated   General bed mobility comments: Increased time  Transfers Overall transfer level: Needs assistance   Transfers: Sit to/from Stand Sit to Stand: Min assist         General transfer comment: Assist to stabilize. Pt c/o some dizziness  Ambulation/Gait Ambulation/Gait assistance: Min assist;Min guard Ambulation Distance (Feet): 100 Feet   Gait Pattern/deviations: Decreased stride length;Decreased step length - right;Decreased step length - left     General Gait Details: 50 feet without an assistive device, 50 feet with RW. Improved stability with use of RW. Encouraged pt to consider use until strength/stability improve. O2 sats 95% but dyspnea ~3/4   Stairs            Wheelchair Mobility    Modified Rankin (Stroke Patients Only)       Balance           Standing balance support: During functional activity Standing balance-Leahy Scale: Fair                      Cognition Arousal/Alertness: Awake/alert Behavior During Therapy: WFL for tasks  assessed/performed Overall Cognitive Status: Within Functional Limits for tasks assessed                      Exercises      General Comments        Pertinent Vitals/Pain Pain Assessment: Faces Faces Pain Scale: Hurts even more Pain Location: L chest/side/back areas Pain Descriptors / Indicators: Sore Pain Intervention(s): Monitored during session;Repositioned;Limited activity within patient's tolerance    Home Living                      Prior Function            PT Goals (current goals can now be found in the care plan section) Progress towards PT goals: Progressing toward goals (although pt appears weaker, less steady on today)    Frequency  Min 3X/week    PT Plan Current plan remains appropriate    Co-evaluation             End of Session Equipment Utilized During Treatment: Gait belt Activity Tolerance: Patient limited by fatigue;Patient limited by pain Patient left: in bed;with call bell/phone within reach     Time: 0926-0945 PT Time Calculation (min) (ACUTE ONLY): 19 min  Charges:  $Gait Training: 8-22 mins                    G Codes:      Weston Anna, MPT Pager: [phone]

## 2014-08-06 NOTE — Care Management Note (Addendum)
Case Management Note  Patient Details  Name: [name] MRN: [id number] Date of Birth: [birth year]  Subjective/Objective:            74 yr old female [name] pt with chest pain Cm consulted for late d/c home services for HHPT and RW. Pt states her mother's walker was a standard walker not rolling as ordered Pt states she believes she was unsteady when PT saw her today because of medication but does agree with CM encouragement to use walker until seen by HHPT        Action/Plan:  CM spoke with pt to assess her choice of home health agency and to review Home health/DME orders for Dr Wendee Beavers.  Pt agreed to home health via [facility] and a RW wt 153 lbs ht 5'3" per pt. Pt provided with RW Husband at bedside CM reviewed  [place] services and how to adjust walker Pt confirms not having a previous walker CM notified Cyril Mourning of [facility] [phone]) of pt HHPT referral   Expected Discharge Date:   08/06/14                Expected Discharge Plan:   home with home health PT and RW  In-House Referral:   na  Discharge planning Services    home with home health PT and RW  Post Acute Care Choice:    home with home health PT and RW Choice offered to:   pt  DME Arranged:   RW DME Agency:    [facility]  HH Arranged:   HHPT [place] Agency:   [facility]   Status of Service:   completed  Medicare Important Message Given:  Yes-second notification given Date Medicare IM Given:    08/05/2014, 12:25  Medicare IM give by:   Camillo Flaming Date Additional Medicare IM Given:   na Additional Medicare Important Message give by:   na    Additional Comments:  Robbie Lis, RN 08/06/2014, 7:17 PM

## 2014-08-07 ENCOUNTER — Telehealth: Payer: Self-pay | Admitting: Interventional Cardiology

## 2014-08-07 ENCOUNTER — Telehealth: Payer: Self-pay | Admitting: *Deleted

## 2014-08-07 NOTE — Telephone Encounter (Signed)
New Message       Pt calling stating that she has questions about her medications and she also wants to know if she needs a post hosp f/u. Pt's AVS states that she needs to f/u with Rheumatology but doesn't states she needs a cardiology f/u. Please call back and advise.

## 2014-08-07 NOTE — Telephone Encounter (Signed)
Spoke with pt and she states has been in the hospital from  08/03/2014 to 08/06/2014 with pneumonia and sent home to take Omnicef 3000 mg bid. INR on discharge was 2.29 . Pt instructed no interaction between coumadin and omnicef  But did make her an appt to be seen in coumadin clinic on Tuesday July 26th and she states understanding. Pt states she is having some problems with " not thinking straight" and she states she thinks is from her medications Instructed to call her MD and she states she does have a call into them

## 2014-08-07 NOTE — Telephone Encounter (Signed)
Patient was unsure of what she should be taking. Looking at her discharge summary from [facility] on 08/06/14, patient was started on Cefdinir  300 mg BID and Tramadol 50 mg PRN. Metoprolol succinate was reduced from 100 mg to 50 mg daily. Referred patient to coumadin clinic to adjust coumadin dose with antibiotic.  Patient is to stop digoxin, etanercept, methotrexate, and verapamil. Patient was worried about not having potassium. Informed patient that the doctors note states " near normal levels suspect continued improvement in improvement in oral intake" when referring to her hypokalemia. Patient's Rheumatoid arthritis medications are being held due to active infection. Patient is to follow up with her PCP and Rheumatologist with these changes. Patient is wanting a follow up with cardiologist. Will send a message to scheduling to set up appointment, due to all her cardiac medication changes. Encouraged patient to follow discharge summary, to check her BP and HR, and to call the office if her BP starts getting high. Patient stated that her BP usually creeps down. Patient is going to call her PCP and her Rheumatologist. Patient stated she felt better about her medications after our conversation. Will forward to Dr. Irish Lack, so he is aware.

## 2014-08-11 ENCOUNTER — Ambulatory Visit (INDEPENDENT_AMBULATORY_CARE_PROVIDER_SITE_OTHER): Payer: Medicare Other | Admitting: Pharmacist Clinician (PhC)/ Clinical Pharmacy Specialist

## 2014-08-11 DIAGNOSIS — Z5181 Encounter for therapeutic drug level monitoring: Secondary | ICD-10-CM | POA: Diagnosis not present

## 2014-08-11 DIAGNOSIS — I4891 Unspecified atrial fibrillation: Secondary | ICD-10-CM | POA: Diagnosis not present

## 2014-08-11 DIAGNOSIS — Z7901 Long term (current) use of anticoagulants: Secondary | ICD-10-CM

## 2014-08-11 LAB — POCT INR: INR: 2.9

## 2014-08-25 ENCOUNTER — Ambulatory Visit (INDEPENDENT_AMBULATORY_CARE_PROVIDER_SITE_OTHER): Payer: Medicare Other | Admitting: *Deleted

## 2014-08-25 ENCOUNTER — Other Ambulatory Visit: Payer: Self-pay | Admitting: Interventional Cardiology

## 2014-08-25 DIAGNOSIS — Z7901 Long term (current) use of anticoagulants: Secondary | ICD-10-CM | POA: Diagnosis not present

## 2014-08-25 DIAGNOSIS — Z5181 Encounter for therapeutic drug level monitoring: Secondary | ICD-10-CM | POA: Diagnosis not present

## 2014-08-25 DIAGNOSIS — I4891 Unspecified atrial fibrillation: Secondary | ICD-10-CM

## 2014-08-25 LAB — POCT INR: INR: 2.1

## 2014-09-10 ENCOUNTER — Ambulatory Visit (INDEPENDENT_AMBULATORY_CARE_PROVIDER_SITE_OTHER): Payer: Medicare Other | Admitting: Interventional Cardiology

## 2014-09-10 ENCOUNTER — Encounter: Payer: Self-pay | Admitting: Interventional Cardiology

## 2014-09-10 VITALS — BP 124/78 | HR 85 | Ht 63.5 in | Wt 155.4 lb

## 2014-09-10 DIAGNOSIS — R079 Chest pain, unspecified: Secondary | ICD-10-CM | POA: Diagnosis not present

## 2014-09-10 DIAGNOSIS — I4891 Unspecified atrial fibrillation: Secondary | ICD-10-CM

## 2014-09-10 DIAGNOSIS — Z7901 Long term (current) use of anticoagulants: Secondary | ICD-10-CM

## 2014-09-10 DIAGNOSIS — I10 Essential (primary) hypertension: Secondary | ICD-10-CM

## 2014-09-10 NOTE — Patient Instructions (Addendum)
**Note De-Identified  Obfuscation** Medication Instructions:  Same-no change  Labwork: None  Testing/Procedures: None  Follow-Up: Your physician wants you to follow-up in: as previously planned. You will receive a reminder letter in the mail two months in advance. If you don't receive a letter, please call our office to schedule the follow-up appointment.

## 2014-09-10 NOTE — Progress Notes (Signed)
Patient ID: [name], female   DOB: 1940/08/16, 74 y.o.   MRN: [id number]     Cardiology Office Note   Date:  09/10/2014   ID:  [name], [name] [birth year], MRN [id number]  PCP:  Nicole Dawson, MD    No chief complaint on file. f/u AFib   Wt Readings from Last 3 Encounters:  09/10/14 155 lb 6.4 oz (70.489 kg)  08/06/14 153 lb 9.6 oz (69.673 kg)  05/25/14 159 lb (72.122 kg)       History of Present Illness: [name] is a 74 y.o. female  who has had AFib. She has had some nosebleeds. She had her left nose cauterized, twice. She has had much fewer nosebleeds since then, but they are not completely gone. She can get them to stop with compression. Had to have mouth cauterized in the past.  Feels AFib/palpitations when she has emotional stress.  Tolerating COumadin.  Rare nosebleeds.  Hospitalized for chest pain and found to have pneumonia.  Ruled out for MI.  Feels better now.  Chest pain whent to back and wrapped around to the left chest.   No SHOB.  Wants to go back to work.  Shewalked with a therapist at home and had no problems.  Much better now than when she left the hospital.    She was told she was  A borderline diabetic.    Past Medical History  Diagnosis Date  . Esophageal reflux   . Rheumatoid arthritis 1975  . Osteopenia   . Polymorphic light eruption 2004  . Hyperlipidemia   . Adhesive capsulitis of left shoulder 1980  . Right middle ear infection 1986  . Acne rosacea   . Allergic rhinitis   . Hypercholesterolemia   . Atrial fibrillation   . Hypertension   . Edema     Past Surgical History  Procedure Laterality Date  . Cardiac catheterization    . Cardioversion       Current Outpatient Prescriptions  Medication Sig Dispense Refill  . acidophilus (RISAQUAD) CAPS capsule Take 1 capsule by mouth daily.    . cholecalciferol (VITAMIN D) 1000 UNITS tablet Take 2,000 Units by mouth daily.    . Etanercept (ENBREL [place])  Take one (1) injection subcutaneous every Friday.    . fluticasone (FLONASE) 50 MCG/ACT nasal spray Place 2 sprays into both nostrils daily as needed for allergies or rhinitis.     . folic acid (FOLVITE) 1 MG tablet Take 2 mg by mouth daily.     . furosemide (LASIX) 40 MG tablet Take 40 mg by mouth daily.    . methotrexate (RHEUMATREX) 2.5 MG tablet Take three (3) tablets (7.5 mg total) by mouth every Tuesday; Take two (2) tablets (5 mg total) by mouth every Wednesday.  1  . metoprolol succinate (TOPROL-XL) 50 MG 24 hr tablet Take 1 tablet (50 mg total) by mouth daily. Take with or immediately following a meal. 30 tablet 0  . Multiple Vitamins-Minerals (MULTIVITAMIN WITH MINERALS) tablet Take 1 tablet by mouth daily.    Marland Kitchen omeprazole (PRILOSEC) 20 MG capsule Take 20 mg by mouth every morning.     . pravastatin (PRAVACHOL) 20 MG tablet TAKE 1 TABLET EVERY EVENING 30 tablet 11  . predniSONE (DELTASONE) 5 MG tablet Take 2.5 mg by mouth daily with breakfast.     . traMADol (ULTRAM) 50 MG tablet Take 1 tablet (50 mg total) by mouth every 6 (six) hours as needed for severe pain.  30 tablet 0  . warfarin (COUMADIN) 5 MG tablet Take 1 tablet by mouth daily or as directed (Patient taking differently: Take 5 mg by mouth daily. Take 1 tablet by mouth Sunday through Friday. Take 1/2 tablet on Saturday) 30 tablet 3   No current facility-administered medications for this visit.    Allergies:   Arthrotec; Morphine sulfate; Gold-containing drug products; Macrodantin; Naproxen; and Penicillins    Social History:  The patient  reports that she has never smoked. She has never used smokeless tobacco. She reports that she does not drink alcohol or use illicit drugs.   Family History:  The patient's family history includes Healthy in her sister; Heart disease in her father, mother, sister, and sister; Other in her mother.    ROS:  Please see the history of present illness.   Otherwise, review of systems are positive  for improving strength.   All other systems are reviewed and negative.    PHYSICAL EXAM: VS:  BP 124/78 mmHg  Pulse 85  Ht 5' 3.5" (1.613 m)  Wt 155 lb 6.4 oz (70.489 kg)  BMI 27.09 kg/m2  SpO2 98% , BMI Body mass index is 27.09 kg/(m^2). GEN: Well nourished, well developed, in no acute distress HEENT: normal Neck: no JVD, carotid bruits, or masses Cardiac: Irregularly irregular; no murmurs, rubs, or gallops,no edema  Respiratory:  clear to auscultation bilaterally, normal work of breathing GI: soft, nontender, nondistended, + BS MS: no deformity or atrophy Skin: warm and dry, no rash Neuro:  Strength and sensation are intact Psych: euthymic mood, full affect   EKG:   The ekg ordered 08/03/14 demonstrates AFib, rate controlled   Recent Labs: 10/29/2013: ALT 48*; Pro B Natriuretic peptide (BNP) 366.6* 08/03/2014: TSH 1.246 08/06/2014: BUN 12; Creatinine, Ser 0.73; Hemoglobin 13.1; Platelets 235; Potassium 3.4*; Sodium 139   Lipid Panel    Component Value Date/Time   CHOL 145 05/21/2013 0846   TRIG 148.0 05/21/2013 0846   HDL 36.60* 05/21/2013 0846   CHOLHDL 4 05/21/2013 0846   VLDL 29.6 05/21/2013 0846   LDLCALC 79 05/21/2013 0846     Other studies Reviewed: Additional studies/ records that were reviewed today with results demonstrating: hospital records reviewed.   ASSESSMENT AND PLAN:  1. AFib: rate controlled.  Coumadin for stroke prevention. Minimal sx of palpitations.   2. Hypertension:  COntrolled. 3. Chest pain: Likely related to PNA.  Resolved. 4. Ankle edema: COntinue Lasix.  Elevate legs 5. Hyperlipidemia: COntinue pravastatin.  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    Current medicines are reviewed at length with the patient today.  The patient concerns regarding her medicines were addressed.  The following changes have been made:  No change  Labs/ tests ordered today include:  No orders of the defined types were placed in this encounter.     Recommend 150 minutes/week of aerobic exercise Low fat, low carb, high fiber diet recommended  Disposition:   FU in as scheduled, [name] 2016   Teresita Madura., MD  09/10/2014 8:31 AM    [facility] [address], [place], [place]  [address] Phone: [phone]; Fax: [phone]

## 2014-09-14 ENCOUNTER — Telehealth: Payer: Self-pay | Admitting: Interventional Cardiology

## 2014-09-14 ENCOUNTER — Ambulatory Visit: Payer: Medicare Other | Admitting: Physician Assistant

## 2014-09-14 NOTE — Telephone Encounter (Signed)
Called [employer] and verified fax number.  Faxed over clearance to return to work and received confirmation. Spoke with pt and informed her that I sent information to employer. Pt verbalized understanding and was appreciative for call.

## 2014-09-14 NOTE — Telephone Encounter (Signed)
New message      Calling to give [place] fax number to fax note to her employer.  Please fax note to [phone]---- [employer] [facility]

## 2014-09-15 ENCOUNTER — Ambulatory Visit (INDEPENDENT_AMBULATORY_CARE_PROVIDER_SITE_OTHER): Payer: Medicare Other

## 2014-09-15 DIAGNOSIS — Z7901 Long term (current) use of anticoagulants: Secondary | ICD-10-CM

## 2014-09-15 DIAGNOSIS — I4891 Unspecified atrial fibrillation: Secondary | ICD-10-CM | POA: Diagnosis not present

## 2014-09-15 DIAGNOSIS — Z5181 Encounter for therapeutic drug level monitoring: Secondary | ICD-10-CM | POA: Diagnosis not present

## 2014-09-15 LAB — POCT INR: INR: 1.9

## 2014-09-23 ENCOUNTER — Ambulatory Visit: Payer: Medicare Other | Admitting: Cardiology

## 2014-09-29 DIAGNOSIS — Z8701 Personal history of pneumonia (recurrent): Secondary | ICD-10-CM | POA: Diagnosis not present

## 2014-10-13 ENCOUNTER — Ambulatory Visit (INDEPENDENT_AMBULATORY_CARE_PROVIDER_SITE_OTHER): Payer: Medicare Other | Admitting: *Deleted

## 2014-10-13 DIAGNOSIS — Z5181 Encounter for therapeutic drug level monitoring: Secondary | ICD-10-CM | POA: Diagnosis not present

## 2014-10-13 DIAGNOSIS — I4891 Unspecified atrial fibrillation: Secondary | ICD-10-CM

## 2014-10-13 DIAGNOSIS — Z7901 Long term (current) use of anticoagulants: Secondary | ICD-10-CM | POA: Diagnosis not present

## 2014-10-13 LAB — POCT INR: INR: 2.3

## 2014-10-19 ENCOUNTER — Other Ambulatory Visit: Payer: Self-pay | Admitting: Interventional Cardiology

## 2014-11-10 ENCOUNTER — Ambulatory Visit (INDEPENDENT_AMBULATORY_CARE_PROVIDER_SITE_OTHER): Payer: Medicare Other | Admitting: *Deleted

## 2014-11-10 DIAGNOSIS — I4891 Unspecified atrial fibrillation: Secondary | ICD-10-CM | POA: Diagnosis not present

## 2014-11-10 DIAGNOSIS — Z7901 Long term (current) use of anticoagulants: Secondary | ICD-10-CM

## 2014-11-10 DIAGNOSIS — Z5181 Encounter for therapeutic drug level monitoring: Secondary | ICD-10-CM

## 2014-11-10 LAB — POCT INR: INR: 2.7

## 2014-12-03 ENCOUNTER — Other Ambulatory Visit: Payer: Self-pay | Admitting: Interventional Cardiology

## 2014-12-08 ENCOUNTER — Ambulatory Visit (INDEPENDENT_AMBULATORY_CARE_PROVIDER_SITE_OTHER): Payer: Medicare Other | Admitting: *Deleted

## 2014-12-08 DIAGNOSIS — Z7901 Long term (current) use of anticoagulants: Secondary | ICD-10-CM

## 2014-12-08 DIAGNOSIS — Z5181 Encounter for therapeutic drug level monitoring: Secondary | ICD-10-CM | POA: Diagnosis not present

## 2014-12-08 DIAGNOSIS — I4891 Unspecified atrial fibrillation: Secondary | ICD-10-CM | POA: Diagnosis not present

## 2014-12-08 LAB — POCT INR: INR: 3.5

## 2014-12-24 ENCOUNTER — Ambulatory Visit (INDEPENDENT_AMBULATORY_CARE_PROVIDER_SITE_OTHER): Payer: Medicare Other | Admitting: *Deleted

## 2014-12-24 DIAGNOSIS — Z5181 Encounter for therapeutic drug level monitoring: Secondary | ICD-10-CM | POA: Diagnosis not present

## 2014-12-24 DIAGNOSIS — Z7901 Long term (current) use of anticoagulants: Secondary | ICD-10-CM

## 2014-12-24 DIAGNOSIS — I4891 Unspecified atrial fibrillation: Secondary | ICD-10-CM

## 2014-12-24 LAB — POCT INR: INR: 2.6

## 2015-01-14 ENCOUNTER — Ambulatory Visit (INDEPENDENT_AMBULATORY_CARE_PROVIDER_SITE_OTHER): Payer: Medicare Other | Admitting: Pharmacist

## 2015-01-14 DIAGNOSIS — Z5181 Encounter for therapeutic drug level monitoring: Secondary | ICD-10-CM

## 2015-01-14 DIAGNOSIS — I4891 Unspecified atrial fibrillation: Secondary | ICD-10-CM | POA: Diagnosis not present

## 2015-01-14 DIAGNOSIS — Z7901 Long term (current) use of anticoagulants: Secondary | ICD-10-CM

## 2015-01-14 LAB — POCT INR: INR: 3

## 2015-01-19 ENCOUNTER — Other Ambulatory Visit: Payer: Self-pay

## 2015-01-19 MED ORDER — METOPROLOL SUCCINATE ER 50 MG PO TB24: 50.0000 mg | ORAL_TABLET | Freq: Every day | ORAL | Status: DC

## 2015-01-19 NOTE — Telephone Encounter (Signed)
Jettie Booze, MD at 09/10/2014 8:23 AM metoprolol succinate (TOPROL-XL) 50 MG 24 hr tabletTake 1 tablet (50 mg total) by mouth daily. Take with or immediately following a meal Current medicines are reviewed at length with the patient today. The patient concerns regarding her medicines were addressed.  The following changes have been made: No change

## 2015-02-08 ENCOUNTER — Ambulatory Visit (INDEPENDENT_AMBULATORY_CARE_PROVIDER_SITE_OTHER): Payer: Medicare Other | Admitting: *Deleted

## 2015-02-08 DIAGNOSIS — I4891 Unspecified atrial fibrillation: Secondary | ICD-10-CM | POA: Diagnosis not present

## 2015-02-08 DIAGNOSIS — Z7901 Long term (current) use of anticoagulants: Secondary | ICD-10-CM

## 2015-02-08 DIAGNOSIS — Z5181 Encounter for therapeutic drug level monitoring: Secondary | ICD-10-CM

## 2015-02-08 LAB — POCT INR: INR: 2.4

## 2015-03-05 ENCOUNTER — Other Ambulatory Visit: Payer: Self-pay | Admitting: Interventional Cardiology

## 2015-03-09 ENCOUNTER — Ambulatory Visit (INDEPENDENT_AMBULATORY_CARE_PROVIDER_SITE_OTHER): Payer: Medicare Other | Admitting: *Deleted

## 2015-03-09 DIAGNOSIS — I4891 Unspecified atrial fibrillation: Secondary | ICD-10-CM | POA: Diagnosis not present

## 2015-03-09 DIAGNOSIS — Z7901 Long term (current) use of anticoagulants: Secondary | ICD-10-CM | POA: Diagnosis not present

## 2015-03-09 DIAGNOSIS — Z5181 Encounter for therapeutic drug level monitoring: Secondary | ICD-10-CM

## 2015-03-09 LAB — POCT INR: INR: 3.7

## 2015-03-31 ENCOUNTER — Ambulatory Visit (INDEPENDENT_AMBULATORY_CARE_PROVIDER_SITE_OTHER): Payer: Medicare Other | Admitting: *Deleted

## 2015-03-31 DIAGNOSIS — Z7901 Long term (current) use of anticoagulants: Secondary | ICD-10-CM | POA: Diagnosis not present

## 2015-03-31 DIAGNOSIS — I4891 Unspecified atrial fibrillation: Secondary | ICD-10-CM | POA: Diagnosis not present

## 2015-03-31 DIAGNOSIS — Z5181 Encounter for therapeutic drug level monitoring: Secondary | ICD-10-CM

## 2015-03-31 LAB — POCT INR: INR: 3.1

## 2015-04-17 DIAGNOSIS — J9690 Respiratory failure, unspecified, unspecified whether with hypoxia or hypercapnia: Secondary | ICD-10-CM

## 2015-04-17 HISTORY — DX: Respiratory failure, unspecified, unspecified whether with hypoxia or hypercapnia: J96.90

## 2015-04-22 ENCOUNTER — Ambulatory Visit (INDEPENDENT_AMBULATORY_CARE_PROVIDER_SITE_OTHER): Payer: Medicare Other | Admitting: *Deleted

## 2015-04-22 DIAGNOSIS — Z7901 Long term (current) use of anticoagulants: Secondary | ICD-10-CM | POA: Diagnosis not present

## 2015-04-22 DIAGNOSIS — Z5181 Encounter for therapeutic drug level monitoring: Secondary | ICD-10-CM | POA: Diagnosis not present

## 2015-04-22 DIAGNOSIS — I4891 Unspecified atrial fibrillation: Secondary | ICD-10-CM

## 2015-04-22 LAB — POCT INR: INR: 2.8

## 2015-04-23 ENCOUNTER — Observation Stay (HOSPITAL_COMMUNITY)
Admission: EM | Admit: 2015-04-23 | Discharge: 2015-04-27 | Disposition: A | Discharge status: Home / Self Care | Payer: Medicare Other | Attending: Family Medicine | Admitting: Family Medicine

## 2015-04-23 ENCOUNTER — Encounter (HOSPITAL_COMMUNITY): Payer: Self-pay | Admitting: Emergency Medicine

## 2015-04-23 ENCOUNTER — Emergency Department (HOSPITAL_COMMUNITY): Payer: Medicare Other

## 2015-04-23 DIAGNOSIS — E876 Hypokalemia: Secondary | ICD-10-CM | POA: Insufficient documentation

## 2015-04-23 DIAGNOSIS — J309 Allergic rhinitis, unspecified: Secondary | ICD-10-CM | POA: Insufficient documentation

## 2015-04-23 DIAGNOSIS — I4581 Long QT syndrome: Secondary | ICD-10-CM | POA: Diagnosis not present

## 2015-04-23 DIAGNOSIS — I481 Persistent atrial fibrillation: Secondary | ICD-10-CM | POA: Diagnosis not present

## 2015-04-23 DIAGNOSIS — K219 Gastro-esophageal reflux disease without esophagitis: Secondary | ICD-10-CM | POA: Insufficient documentation

## 2015-04-23 DIAGNOSIS — R0789 Other chest pain: Principal | ICD-10-CM | POA: Insufficient documentation

## 2015-04-23 DIAGNOSIS — I209 Angina pectoris, unspecified: Secondary | ICD-10-CM

## 2015-04-23 DIAGNOSIS — I10 Essential (primary) hypertension: Secondary | ICD-10-CM | POA: Diagnosis present

## 2015-04-23 DIAGNOSIS — M069 Rheumatoid arthritis, unspecified: Secondary | ICD-10-CM | POA: Insufficient documentation

## 2015-04-23 DIAGNOSIS — Z7952 Long term (current) use of systemic steroids: Secondary | ICD-10-CM | POA: Diagnosis not present

## 2015-04-23 DIAGNOSIS — Z7901 Long term (current) use of anticoagulants: Secondary | ICD-10-CM | POA: Diagnosis not present

## 2015-04-23 DIAGNOSIS — Z79899 Other long term (current) drug therapy: Secondary | ICD-10-CM | POA: Insufficient documentation

## 2015-04-23 DIAGNOSIS — R079 Chest pain, unspecified: Secondary | ICD-10-CM | POA: Diagnosis present

## 2015-04-23 DIAGNOSIS — I272 Other secondary pulmonary hypertension: Secondary | ICD-10-CM | POA: Diagnosis not present

## 2015-04-23 DIAGNOSIS — E7800 Pure hypercholesterolemia, unspecified: Secondary | ICD-10-CM | POA: Diagnosis not present

## 2015-04-23 DIAGNOSIS — I482 Chronic atrial fibrillation: Secondary | ICD-10-CM | POA: Diagnosis not present

## 2015-04-23 DIAGNOSIS — J9600 Acute respiratory failure, unspecified whether with hypoxia or hypercapnia: Secondary | ICD-10-CM

## 2015-04-23 DIAGNOSIS — R9431 Abnormal electrocardiogram [ECG] [EKG]: Secondary | ICD-10-CM | POA: Diagnosis present

## 2015-04-23 HISTORY — DX: Abnormal electrocardiogram (ECG) (EKG): R94.31

## 2015-04-23 LAB — CBC
HEMATOCRIT: 38.8 % (ref 36.0–46.0)
Hemoglobin: 12.8 g/dL (ref 12.0–15.0)
MCH: 31.1 pg (ref 26.0–34.0)
MCHC: 33 g/dL (ref 30.0–36.0)
MCV: 94.2 fL (ref 78.0–100.0)
Platelets: 214 10*3/uL (ref 150–400)
RBC: 4.12 MIL/uL (ref 3.87–5.11)
RDW: 16.8 % — AB (ref 11.5–15.5)
WBC: 8.7 10*3/uL (ref 4.0–10.5)

## 2015-04-23 LAB — BASIC METABOLIC PANEL
Anion gap: 10 (ref 5–15)
BUN: 14 mg/dL (ref 6–20)
CO2: 28 mmol/L (ref 22–32)
Calcium: 9.5 mg/dL (ref 8.9–10.3)
Chloride: 104 mmol/L (ref 101–111)
Creatinine, Ser: 0.97 mg/dL (ref 0.44–1.00)
GFR calc Af Amer: >60 mL/min (ref >60)
GFR, EST NON AFRICAN AMERICAN: 57 mL/min — AB (ref >60)
GLUCOSE: 156 mg/dL — AB (ref 65–99)
POTASSIUM: 2.8 mmol/L — AB (ref 3.5–5.1)
Sodium: 142 mmol/L (ref 135–145)

## 2015-04-23 LAB — I-STAT TROPONIN, ED: Troponin i, poc: 0 ng/mL (ref 0.00–0.08)

## 2015-04-23 MED ORDER — SODIUM CHLORIDE 0.9 % IV BOLUS (SEPSIS)
500.0000 mL | Freq: Once | INTRAVENOUS | Status: AC
Start: 2015-04-23 — End: 2015-04-24
  Administered 2015-04-23: 500 mL via INTRAVENOUS

## 2015-04-23 MED ORDER — METHOCARBAMOL 1000 MG/10ML IJ SOLN
500.0000 mg | Freq: Once | INTRAVENOUS | Status: AC
  Administered 2015-04-24: 500 mg via INTRAVENOUS
  Filled 2015-04-23: qty 5

## 2015-04-23 MED ORDER — POTASSIUM CHLORIDE CRYS ER 20 MEQ PO TBCR
60.0000 meq | EXTENDED_RELEASE_TABLET | Freq: Once | ORAL | Status: AC
  Administered 2015-04-23: 60 meq via ORAL
  Filled 2015-04-23: qty 3

## 2015-04-23 MED ORDER — ACETAMINOPHEN 500 MG PO TABS
1000.0000 mg | ORAL_TABLET | Freq: Once | ORAL | Status: AC
  Administered 2015-04-23: 1000 mg via ORAL
  Filled 2015-04-23: qty 2

## 2015-04-23 MED ORDER — POTASSIUM CHLORIDE 10 MEQ/100ML IV SOLN
10.0000 meq | INTRAVENOUS | Status: AC
  Administered 2015-04-23 – 2015-04-24 (×2): 10 meq via INTRAVENOUS
  Filled 2015-04-23 (×2): qty 100

## 2015-04-23 NOTE — ED Provider Notes (Signed)
CSN: [id number]     Arrival date & time 04/23/15  2124 History  By signing my name below, I, Arianna Nassar, attest that this documentation has been prepared under the direction and in the presence of Everlene Balls, MD. Electronically Signed: Julien Nordmann, ED Scribe. 04/23/2015. 11:13 PM.     Chief Complaint  Patient presents with  . Atrial Fibrillation  . Chest Pain      The history is provided by the patient. No language interpreter was used.   HPI Comments: [name] is a 75 y.o. female who has a PMHx of esophageal reflux, rheumatoid arthritis, osteopenia, HLD, a-fib, HTN, and hypercholesterolemia presents to the Emergency Department complaining of constant, gradual worsening atrial fibrillation secondary to sudden onset stress. Pt states she is having sharp, left sided chest pain that radiates down her back with associated nausea onset about on and half hours. She notes her husband is currently in the hospital and after walking through the parking lot, she became very out of breath. Pt notes her appetite and diet have not been as great due to stressing about her husband. Pt says moving her left arm and taking a deep breath increases her chest pain. She says that since June of last year, she has been in constant a-fib. Pt currently takes coumadin. Denies vomiting, fever, rhinorrhea, cough, hx of MI, and hx of blood clots.  Past Medical History  Diagnosis Date  . Esophageal reflux   . Rheumatoid arthritis ([place]) 1975  . Osteopenia   . Polymorphic light eruption 2004  . Hyperlipidemia   . Adhesive capsulitis of left shoulder 1980  . Right middle ear infection 1986  . Acne rosacea   . Allergic rhinitis   . Hypercholesterolemia   . Atrial fibrillation ([place])   . Hypertension   . Edema   . Prolonged QT interval    Past Surgical History  Procedure Laterality Date  . Cardiac catheterization    . Cardioversion    . Temporomandibular joint surgery    . Abdominal hysterectomy      Family History  Problem Relation Age of Onset  . Heart disease Father   . Heart disease Mother   . Other Mother     prolonged qtc  . Heart disease Sister   . Heart disease Sister   . Healthy Sister    Social History  Substance Use Topics  . Smoking status: Never Smoker   . Smokeless tobacco: Never Used  . Alcohol Use: No   OB History    No data available     Review of Systems  A complete 10 system review of systems was obtained and all systems are negative except as noted in the HPI and PMH.    Allergies  Arthrotec; Erythromycin; Morphine sulfate; Gold-containing drug products; Macrodantin; Naproxen; and Penicillins  Home Medications   Prior to Admission medications   Medication Sig Start Date End Date Taking? Authorizing Provider  acidophilus (RISAQUAD) CAPS capsule Take 1 capsule by mouth daily.   Yes Historical Provider, MD  cholecalciferol (VITAMIN D) 1000 UNITS tablet Take 2,000 Units by mouth daily.   Yes Historical Provider, MD  Etanercept (ENBREL [place]) Take one (1) injection subcutaneous every Friday.   Yes Historical Provider, MD  fluticasone (FLONASE) 50 MCG/ACT nasal spray Place 2 sprays into both nostrils daily as needed for allergies or rhinitis.    Yes Historical Provider, MD  folic acid (FOLVITE) 1 MG tablet Take 2 mg by mouth daily.  Yes Historical Provider, MD  furosemide (LASIX) 40 MG tablet Take 40 mg by mouth daily.   Yes Historical Provider, MD  furosemide (LASIX) 40 MG tablet TAKE 1 TABLET BY MOUTH EVERY DAY OR AS DIRECTED 12/03/14  Yes Jettie Booze, MD  hydroxypropyl methylcellulose / hypromellose (ISOPTO TEARS / GONIOVISC) 2.5 % ophthalmic solution Place 1 drop into both eyes 4 (four) times daily as needed for dry eyes.   Yes Historical Provider, MD  methotrexate (RHEUMATREX) 2.5 MG tablet Take three (3) tablets (7.5 mg total) by mouth every Tuesday; Take two (2) tablets (5 mg total) by mouth every Wednesday. 08/29/14  Yes Historical Provider,  MD  metoprolol succinate (TOPROL-XL) 50 MG 24 hr tablet Take 1 tablet (50 mg total) by mouth daily. Take with or immediately following a meal. 01/19/15  Yes Jettie Booze, MD  Multiple Vitamins-Minerals (MULTIVITAMIN WITH MINERALS) tablet Take 1 tablet by mouth daily.   Yes Historical Provider, MD  omeprazole (PRILOSEC) 20 MG capsule Take 20 mg by mouth every morning.    Yes Historical Provider, MD  pravastatin (PRAVACHOL) 20 MG tablet TAKE 1 TABLET EVERY EVENING 06/11/14  Yes Jettie Booze, MD  predniSONE (DELTASONE) 5 MG tablet Take 2.5 mg by mouth daily with breakfast.    Yes Historical Provider, MD  traMADol (ULTRAM) 50 MG tablet Take 1 tablet (50 mg total) by mouth every 6 (six) hours as needed for severe pain. 08/06/14  Yes Velvet Bathe, MD  warfarin (COUMADIN) 5 MG tablet Take 2.5-5 mg by mouth daily. Take 2.5mg  on Thursday. Take 5mg  rest of days   Yes Historical Provider, MD  warfarin (COUMADIN) 5 MG tablet TAKE 1 TABLET BY MOUTH DAILY OR AS DIRECTED Patient not taking: Reported on 04/23/2015 03/08/15   Jettie Booze, MD   Triage vitals: BP 132/98 mmHg  Pulse 87  Temp(Src) 98.1 F (36.7 C) (Oral)  Resp 23  SpO2 97% Physical Exam  Constitutional: She is oriented to person, place, and time. She appears well-developed and well-nourished. No distress.  HENT:  Head: Normocephalic and atraumatic.  Nose: Nose normal.  Mouth/Throat: Oropharynx is clear and moist. No oropharyngeal exudate.  Eyes: Conjunctivae and EOM are normal. Pupils are equal, round, and reactive to light. No scleral icterus.  Neck: Normal range of motion. Neck supple. No JVD present. No tracheal deviation present. No thyromegaly present.  Cardiovascular: Normal rate and normal heart sounds.  An irregularly irregular rhythm present. Exam reveals no gallop and no friction rub.   No murmur heard. Pulmonary/Chest: Effort normal and breath sounds normal. No respiratory distress. She has no wheezes. She exhibits no  tenderness.  Abdominal: Soft. Bowel sounds are normal. She exhibits no distension and no mass. There is no tenderness. There is no rebound and no guarding.  Musculoskeletal: Normal range of motion. She exhibits no edema or tenderness.  Lymphadenopathy:    She has no cervical adenopathy.  Neurological: She is alert and oriented to person, place, and time. No cranial nerve deficit. She exhibits normal muscle tone.  Skin: Skin is warm and dry. No rash noted. No erythema. No pallor.  Nursing note and vitals reviewed.   ED Course  Procedures  DIAGNOSTIC STUDIES: Oxygen Saturation is 97% on RA, normal by my interpretation.  COORDINATION OF CARE:  11:07 PM Discussed treatment plan which includes IV fluids and repeat lab work with pt at bedside and pt agreed to plan.  Labs Review Labs Reviewed  BASIC METABOLIC PANEL - Abnormal; Notable for  the following:    Potassium 2.8 (*)    Glucose, Bld 156 (*)    GFR calc non Af Amer 57 (*)    All other components within normal limits  CBC - Abnormal; Notable for the following:    RDW 16.8 (*)    All other components within normal limits  MAGNESIUM - Abnormal; Notable for the following:    Magnesium 1.4 (*)    All other components within normal limits  PROTIME-INR - Abnormal; Notable for the following:    Prothrombin Time 24.1 (*)    INR 2.19 (*)    All other components within normal limits  MAGNESIUM  PHOSPHORUS  TSH  COMPREHENSIVE METABOLIC PANEL  CBC  TROPONIN I  TROPONIN I  TROPONIN I  HEMOGLOBIN A1C  I-STAT TROPOININ, ED  I-STAT TROPOININ, ED    Imaging Review Dg Chest 2 View  04/23/2015  CLINICAL DATA:  Chest pain, weakness, shortness of breath and dizziness. Symptoms for 1 day. EXAM: CHEST  2 VIEW COMPARISON:  08/04/2014 FINDINGS: Lung volumes remain low. Stable cardiomegaly. There is mild interstitial thickening concerning for edema. Atelectasis or scarring in the right lower lobe. No large pleural effusion. No pneumothorax.  IMPRESSION: Cardiomegaly with mild interstitial edema. Electronically Signed   By: Jeb Levering M.D.   On: 04/23/2015 22:25   I have personally reviewed and evaluated these images and lab results as part of my medical decision-making.   EKG Interpretation   Date/Time:  Friday April 23 2015 21:28:03 EDT Ventricular Rate:  103 PR Interval:    QRS Duration: 72 QT Interval:  428 QTC Calculation: 560 R Axis:   1 Text Interpretation:   Critical Test Result: Long QTc Atrial  fibrillation with rapid ventricular response with premature ventricular or  aberrantly conducted complexes Nonspecific ST and T wave abnormality  Prolonged QT Abnormal ECG h/o afib  Confirmed by LIU MD, Hinton Dyer [id number]) on  04/23/2015 10:39:52 PM      MDM   Final diagnoses:  None   Patient presents to the ED for chest pain.  She states this occurs when she is stressed out.  History is not consistent with ACS, there is no exertional component and no associated vomiting or diaphoresis.  Will hold in the ED for delta troponin in 3 hours as well as repeat EKG to eval for Qt after potassium is replaced.  Mag level also sent.    Magnesium level is low as well, this was replaced with IV magnesium. Patient continues to have chest pain despite interventions. I spoke with the [facility] hospitalist who accepted the patient for further care.   I personally performed the services described in this documentation, which was scribed in my presence. The recorded information has been reviewed and is accurate.     Everlene Balls, MD 04/24/15 [phone]

## 2015-04-23 NOTE — ED Notes (Signed)
C/o afib, L breast pain that radiates under L breast, L arm, and into back, sob, and nausea since 3pm today.

## 2015-04-24 ENCOUNTER — Observation Stay (HOSPITAL_BASED_OUTPATIENT_CLINIC_OR_DEPARTMENT_OTHER): Payer: Medicare Other

## 2015-04-24 ENCOUNTER — Encounter (HOSPITAL_COMMUNITY): Payer: Self-pay | Admitting: Internal Medicine

## 2015-04-24 DIAGNOSIS — K219 Gastro-esophageal reflux disease without esophagitis: Secondary | ICD-10-CM | POA: Diagnosis not present

## 2015-04-24 DIAGNOSIS — I482 Chronic atrial fibrillation: Secondary | ICD-10-CM | POA: Diagnosis not present

## 2015-04-24 DIAGNOSIS — M069 Rheumatoid arthritis, unspecified: Secondary | ICD-10-CM | POA: Diagnosis present

## 2015-04-24 DIAGNOSIS — I481 Persistent atrial fibrillation: Secondary | ICD-10-CM | POA: Diagnosis not present

## 2015-04-24 DIAGNOSIS — I10 Essential (primary) hypertension: Secondary | ICD-10-CM | POA: Diagnosis not present

## 2015-04-24 DIAGNOSIS — R9431 Abnormal electrocardiogram [ECG] [EKG]: Secondary | ICD-10-CM | POA: Diagnosis present

## 2015-04-24 DIAGNOSIS — E876 Hypokalemia: Secondary | ICD-10-CM | POA: Diagnosis not present

## 2015-04-24 DIAGNOSIS — I4581 Long QT syndrome: Secondary | ICD-10-CM

## 2015-04-24 DIAGNOSIS — R079 Chest pain, unspecified: Secondary | ICD-10-CM | POA: Diagnosis not present

## 2015-04-24 DIAGNOSIS — I4891 Unspecified atrial fibrillation: Secondary | ICD-10-CM | POA: Diagnosis not present

## 2015-04-24 DIAGNOSIS — R0789 Other chest pain: Secondary | ICD-10-CM | POA: Diagnosis not present

## 2015-04-24 LAB — TSH: TSH: 1.752 u[IU]/mL (ref 0.350–4.500)

## 2015-04-24 LAB — CBC
HEMATOCRIT: 38.2 % (ref 36.0–46.0)
Hemoglobin: 11.9 g/dL — ABNORMAL LOW (ref 12.0–15.0)
MCH: 29.5 pg (ref 26.0–34.0)
MCHC: 31.2 g/dL (ref 30.0–36.0)
MCV: 94.8 fL (ref 78.0–100.0)
Platelets: 199 10*3/uL (ref 150–400)
RBC: 4.03 MIL/uL (ref 3.87–5.11)
RDW: 16.7 % — AB (ref 11.5–15.5)
WBC: 10 10*3/uL (ref 4.0–10.5)

## 2015-04-24 LAB — ECHOCARDIOGRAM COMPLETE
Height: 63 in
WEIGHTICAEL: 2437.41 [oz_av]

## 2015-04-24 LAB — COMPREHENSIVE METABOLIC PANEL
ALBUMIN: 3.4 g/dL — AB (ref 3.5–5.0)
ALT: 48 U/L (ref 14–54)
AST: 37 U/L (ref 15–41)
Alkaline Phosphatase: 59 U/L (ref 38–126)
Anion gap: 11 (ref 5–15)
BILIRUBIN TOTAL: 0.8 mg/dL (ref 0.3–1.2)
BUN: 11 mg/dL (ref 6–20)
CO2: 25 mmol/L (ref 22–32)
CREATININE: 0.75 mg/dL (ref 0.44–1.00)
Calcium: 8.9 mg/dL (ref 8.9–10.3)
Chloride: 106 mmol/L (ref 101–111)
GFR calc Af Amer: >60 mL/min (ref >60)
GFR calc non Af Amer: >60 mL/min (ref >60)
GLUCOSE: 119 mg/dL — AB (ref 65–99)
POTASSIUM: 3.6 mmol/L (ref 3.5–5.1)
Sodium: 142 mmol/L (ref 135–145)
TOTAL PROTEIN: 6.2 g/dL — AB (ref 6.5–8.1)

## 2015-04-24 LAB — PROTIME-INR
INR: 2.19 — AB (ref 0.00–1.49)
PROTHROMBIN TIME: 24.1 s — AB (ref 11.6–15.2)

## 2015-04-24 LAB — PHOSPHORUS: PHOSPHORUS: 3 mg/dL (ref 2.5–4.6)

## 2015-04-24 LAB — I-STAT TROPONIN, ED: TROPONIN I, POC: 0 ng/mL (ref 0.00–0.08)

## 2015-04-24 LAB — TROPONIN I
Troponin I: <0.03 ng/mL (ref <0.031)
Troponin I: <0.03 ng/mL (ref <0.031)

## 2015-04-24 LAB — MAGNESIUM
Magnesium: 1.4 mg/dL — ABNORMAL LOW (ref 1.7–2.4)
Magnesium: 2.4 mg/dL (ref 1.7–2.4)

## 2015-04-24 MED ORDER — ACETAMINOPHEN 650 MG RE SUPP: 650.0000 mg | Freq: Four times a day (QID) | RECTAL | Status: DC | PRN

## 2015-04-24 MED ORDER — MAGNESIUM SULFATE 2 GM/50ML IV SOLN
2.0000 g | Freq: Once | INTRAVENOUS | Status: AC
  Administered 2015-04-24: 2 g via INTRAVENOUS
  Filled 2015-04-24: qty 50

## 2015-04-24 MED ORDER — FLUTICASONE PROPIONATE 50 MCG/ACT NA SUSP: 2.0000 | Freq: Every day | NASAL | Status: DC | PRN

## 2015-04-24 MED ORDER — WARFARIN - PHARMACIST DOSING INPATIENT: Freq: Every day | Status: DC

## 2015-04-24 MED ORDER — PREDNISONE 5 MG PO TABS
2.5000 mg | ORAL_TABLET | Freq: Every day | ORAL | Status: DC
  Administered 2015-04-24 – 2015-04-27 (×4): 2.5 mg via ORAL
  Filled 2015-04-24 (×4): qty 1

## 2015-04-24 MED ORDER — HYDROCODONE-ACETAMINOPHEN 5-325 MG PO TABS
1.0000 | ORAL_TABLET | ORAL | Status: DC | PRN
  Administered 2015-04-24 – 2015-04-26 (×7): 2 via ORAL
  Filled 2015-04-24 (×7): qty 2

## 2015-04-24 MED ORDER — FOLIC ACID 1 MG PO TABS
2.0000 mg | ORAL_TABLET | Freq: Every day | ORAL | Status: DC
  Administered 2015-04-24 – 2015-04-27 (×4): 2 mg via ORAL
  Filled 2015-04-24 (×4): qty 2

## 2015-04-24 MED ORDER — WARFARIN SODIUM 2.5 MG PO TABS: 2.5000 mg | ORAL_TABLET | ORAL | Status: DC

## 2015-04-24 MED ORDER — ACETAMINOPHEN 325 MG PO TABS
650.0000 mg | ORAL_TABLET | Freq: Four times a day (QID) | ORAL | Status: DC | PRN
  Administered 2015-04-24: 650 mg via ORAL

## 2015-04-24 MED ORDER — ENOXAPARIN SODIUM 40 MG/0.4ML ~~LOC~~ SOLN: 40.0000 mg | SUBCUTANEOUS | Status: DC

## 2015-04-24 MED ORDER — METHOCARBAMOL 1000 MG/10ML IJ SOLN: 500.0000 mg | Freq: Four times a day (QID) | INTRAMUSCULAR | Status: DC | PRN

## 2015-04-24 MED ORDER — SODIUM CHLORIDE 0.9% FLUSH
3.0000 mL | Freq: Two times a day (BID) | INTRAVENOUS | Status: DC
  Administered 2015-04-24 – 2015-04-27 (×6): 3 mL via INTRAVENOUS

## 2015-04-24 MED ORDER — WARFARIN SODIUM 5 MG PO TABS
5.0000 mg | ORAL_TABLET | ORAL | Status: DC
  Administered 2015-04-24 – 2015-04-26 (×3): 5 mg via ORAL
  Filled 2015-04-24 (×3): qty 1

## 2015-04-24 MED ORDER — PRAVASTATIN SODIUM 20 MG PO TABS
20.0000 mg | ORAL_TABLET | Freq: Every evening | ORAL | Status: DC
  Administered 2015-04-24 – 2015-04-26 (×3): 20 mg via ORAL
  Filled 2015-04-24 (×3): qty 1

## 2015-04-24 MED ORDER — METOPROLOL SUCCINATE ER 50 MG PO TB24
50.0000 mg | ORAL_TABLET | Freq: Every day | ORAL | Status: DC
  Administered 2015-04-24 – 2015-04-27 (×4): 50 mg via ORAL
  Filled 2015-04-24 (×4): qty 1

## 2015-04-24 NOTE — Progress Notes (Signed)
ANTICOAGULATION CONSULT NOTE - Initial Consult  Pharmacy Consult for Coumadin Indication: atrial fibrillation  Allergies  Allergen Reactions  . Arthrotec [Diclofenac-Misoprostol] Diarrhea  . Erythromycin     Anaphylaxis   . Morphine Sulfate Other (See Comments)    Erratic behavior; hallucinations  . Gold-Containing Drug Products Rash  . Macrodantin [Nitrofurantoin Macrocrystal] Rash  . Naproxen Rash  . Penicillins Rash    amoxicillin    Patient Measurements: Height: 5\' 3"  (160 cm) Weight: 152 lb 5.4 oz (69.1 kg) IBW/kg (Calculated) : 52.4  Vital Signs: Temp: 98.6 F (37 C) (04/08 0420) Temp Source: Oral (04/08 0420) BP: 139/84 mmHg (04/08 0420) Pulse Rate: 99 (04/08 0420)  Labs:  Recent Labs  04/22/15 1456 04/23/15 2205 04/24/15 0006  HGB  --  12.8  --   HCT  --  38.8  --   PLT  --  214  --   LABPROT  --   --  24.1*  INR 2.8  --  2.19*  CREATININE  --  0.97  --     Estimated Creatinine Clearance: 48.2 mL/min (by C-G formula based on Cr of 0.97).   Medical History: Past Medical History  Diagnosis Date  . Esophageal reflux   . Rheumatoid arthritis ([place]) 1975  . Osteopenia   . Polymorphic light eruption 2004  . Hyperlipidemia   . Adhesive capsulitis of left shoulder 1980  . Right middle ear infection 1986  . Acne rosacea   . Allergic rhinitis   . Hypercholesterolemia   . Atrial fibrillation ([place])   . Hypertension   . Edema   . Prolonged QT interval     Medications:  Prescriptions prior to admission  Medication Sig Dispense Refill Last Dose  . acidophilus (RISAQUAD) CAPS capsule Take 1 capsule by mouth daily.   04/23/2015 at Unknown time  . cholecalciferol (VITAMIN D) 1000 UNITS tablet Take 2,000 Units by mouth daily.   04/23/2015 at Unknown time  . Etanercept (ENBREL ) Take one (1) injection subcutaneous every Friday.   Past Week at Unknown time  . fluticasone (FLONASE) 50 MCG/ACT nasal spray Place 2 sprays into both nostrils daily as needed for  allergies or rhinitis.    Past Month at Unknown time  . folic acid (FOLVITE) 1 MG tablet Take 2 mg by mouth daily.    04/23/2015 at Unknown time  . furosemide (LASIX) 40 MG tablet Take 20 mg by mouth daily.    04/23/2015 at Unknown time  . furosemide (LASIX) 40 MG tablet TAKE 1 TABLET BY MOUTH EVERY DAY OR AS DIRECTED 30 tablet 5 04/23/2015 at Unknown time  . hydroxypropyl methylcellulose / hypromellose (ISOPTO TEARS / GONIOVISC) 2.5 % ophthalmic solution Place 1 drop into both eyes 4 (four) times daily as needed for dry eyes.   04/23/2015 at Unknown time  . methotrexate (RHEUMATREX) 2.5 MG tablet Take three (3) tablets (7.5 mg total) by mouth every Tuesday; Take two (2) tablets (5 mg total) by mouth every Wednesday.  1 04/23/2015 at Unknown time  . metoprolol succinate (TOPROL-XL) 50 MG 24 hr tablet Take 1 tablet (50 mg total) by mouth daily. Take with or immediately following a meal. 90 tablet 0 04/23/2015 at Unknown time  . Multiple Vitamins-Minerals (MULTIVITAMIN WITH MINERALS) tablet Take 1 tablet by mouth daily.   04/23/2015 at Unknown time  . omeprazole (PRILOSEC) 20 MG capsule Take 20 mg by mouth every morning.    04/23/2015 at Unknown time  . pravastatin (PRAVACHOL) 20 MG tablet TAKE  1 TABLET EVERY EVENING 30 tablet 11 04/23/2015 at Unknown time  . predniSONE (DELTASONE) 5 MG tablet Take 2.5 mg by mouth daily with breakfast.    04/23/2015 at Unknown time  . traMADol (ULTRAM) 50 MG tablet Take 1 tablet (50 mg total) by mouth every 6 (six) hours as needed for severe pain. 30 tablet 0 Past Month at Unknown time  . warfarin (COUMADIN) 5 MG tablet Take 2.5-5 mg by mouth daily. Take 2.5mg  on Thursday. Take 5mg  rest of days   04/23/2015 at Unknown time  . warfarin (COUMADIN) 5 MG tablet TAKE 1 TABLET BY MOUTH DAILY OR AS DIRECTED (Patient not taking: Reported on 04/23/2015) 30 tablet 3 Not Taking at Unknown time   Scheduled:  . folic acid  2 mg Oral Daily  . metoprolol succinate  50 mg Oral Daily  . pravastatin  20 mg  Oral QPM  . predniSONE  2.5 mg Oral Q breakfast  . sodium chloride flush  3 mL Intravenous Q12H    Assessment: 75yo female c/o palpitations and CP radiating under left breast to LUE, admitted for further w/u, to continue Coumadin for Afib; current INR at goal w/ last dose of Coumadin taken 4/7 PTA.  Goal of Therapy:  INR 2-3   Plan:  Will continue home Coumadin dose of 5mg  daily except 2.5mg  on Thursdays and monitor INR.  Wynona Neat, PharmD, BCPS  04/24/2015,6:09 AM

## 2015-04-24 NOTE — Progress Notes (Signed)
  Echocardiogram 2D Echocardiogram has been performed.  [name] M 04/24/2015, 1:19 PM

## 2015-04-24 NOTE — Progress Notes (Signed)
Patient seen and evaluated earlier this a.m. by my associate. Please refer to H&P for details regarding assessment and plan.  Troponin is negative and INR within therapeutic range.  We'll plan on reassessing next a.m. Patient has no new complaints.  Gen.: Patient in no acute distress, alert and awake Cardiovascular: No cyanosis, S1 and S2 present, no rubs Pulmonary: Equal chest rise, no wheezes, no increased work of breathing  Will reassess next am.  Velvet Bathe

## 2015-04-24 NOTE — Discharge Instructions (Signed)

## 2015-04-24 NOTE — H&P (Signed)
PCP:  Shamleffer, Herschell Dimes, MD  Cardiology [facility]  Referring provider [place]   Chief Complaint:  Chest Pain  HPI: [name] is a 75 y.o. female   has a past medical history of Esophageal reflux; Rheumatoid arthritis ([place]) (1975); Osteopenia; Polymorphic light eruption (2004); Hyperlipidemia; Adhesive capsulitis of left shoulder (1980); Right middle ear infection (1986); Acne rosacea; Allergic rhinitis; Hypercholesterolemia; Atrial fibrillation ([place]); Hypertension; Edema; and Prolonged QT interval.   Presented with palpitations and chest pain radiating under left breast going to left arm and into the back associated shortness of breath and nausea occurred since 3 PM worse after patient had to go back and forth to a car since her husband has been hospitalized. Describes pain as sharp. She has been under a lot of stress and hasn't been eating well. Taking a deep breath or moving her left arm worsens the pain. Pain is still present worse when she moves her arm.  IN ER: Troponin negative EKG showing atrial fibrillation but no evidence of ischemia chest x-ray showing cardiomegaly and mild vascular congestion but no infiltrate, WBC 8.7 She was found to have magnesium 1.4 which was repleted and potassium of 2.8 which was also treated INR therapeutic at 2.19  Regarding pertinent past history: Known history of atrial fibrillation on chronic Coumadin, dictated by recurrent epistaxis. In the past required cardioversion. She also has known history of hypertension which is well-controlled home medications.  Hospitalist was called for admission for chest pain shortness of breath   Review of Systems:    Pertinent positives include: dyspnea on exertion, chest pain,   Constitutional:  No weight loss, night sweats, Fevers, chills, fatigue, weight loss  HEENT:  No headaches, Difficulty swallowing,Tooth/dental problems,Sore throat,  No sneezing, itching, ear ache, nasal congestion, post  nasal drip,  Cardio-vascular:  No Orthopnea, PND, anasarca, dizziness, palpitations.no Bilateral lower extremity swelling  GI:  No heartburn, indigestion, abdominal pain, nausea, vomiting, diarrhea, change in bowel habits, loss of appetite, melena, blood in stool, hematemesis Resp:  no shortness of breath at rest. No No excess mucus, no productive cough, No non-productive cough, No coughing up of blood.No change in color of mucus.No wheezing. Skin:  no rash or lesions. No jaundice GU:  no dysuria, change in color of urine, no urgency or frequency. No straining to urinate.  No flank pain.  Musculoskeletal:  No joint pain or no joint swelling. No decreased range of motion. No back pain.  Psych:  No change in mood or affect. No depression or anxiety. No memory loss.  Neuro: no localizing neurological complaints, no tingling, no weakness, no double vision, no gait abnormality, no slurred speech, no confusion  Otherwise ROS are negative except for above, 10 systems were reviewed  Past Medical History: Past Medical History  Diagnosis Date  . Esophageal reflux   . Rheumatoid arthritis ([place]) 1975  . Osteopenia   . Polymorphic light eruption 2004  . Hyperlipidemia   . Adhesive capsulitis of left shoulder 1980  . Right middle ear infection 1986  . Acne rosacea   . Allergic rhinitis   . Hypercholesterolemia   . Atrial fibrillation ([place])   . Hypertension   . Edema   . Prolonged QT interval    Past Surgical History  Procedure Laterality Date  . Cardiac catheterization    . Cardioversion    . Temporomandibular joint surgery    . Abdominal hysterectomy       Medications: Prior to Admission medications   Medication Sig  Start Date End Date Taking? Authorizing Provider  acidophilus (RISAQUAD) CAPS capsule Take 1 capsule by mouth daily.   Yes Historical Provider, MD  cholecalciferol (VITAMIN D) 1000 UNITS tablet Take 2,000 Units by mouth daily.   Yes Historical Provider, MD    Etanercept (ENBREL [place]) Take one (1) injection subcutaneous every Friday.   Yes Historical Provider, MD  fluticasone (FLONASE) 50 MCG/ACT nasal spray Place 2 sprays into both nostrils daily as needed for allergies or rhinitis.    Yes Historical Provider, MD  folic acid (FOLVITE) 1 MG tablet Take 2 mg by mouth daily.    Yes Historical Provider, MD  furosemide (LASIX) 40 MG tablet Take 40 mg by mouth daily.   Yes Historical Provider, MD  furosemide (LASIX) 40 MG tablet TAKE 1 TABLET BY MOUTH EVERY DAY OR AS DIRECTED 12/03/14  Yes Jettie Booze, MD  hydroxypropyl methylcellulose / hypromellose (ISOPTO TEARS / GONIOVISC) 2.5 % ophthalmic solution Place 1 drop into both eyes 4 (four) times daily as needed for dry eyes.   Yes Historical Provider, MD  methotrexate (RHEUMATREX) 2.5 MG tablet Take three (3) tablets (7.5 mg total) by mouth every Tuesday; Take two (2) tablets (5 mg total) by mouth every Wednesday. 08/29/14  Yes Historical Provider, MD  metoprolol succinate (TOPROL-XL) 50 MG 24 hr tablet Take 1 tablet (50 mg total) by mouth daily. Take with or immediately following a meal. 01/19/15  Yes Jettie Booze, MD  Multiple Vitamins-Minerals (MULTIVITAMIN WITH MINERALS) tablet Take 1 tablet by mouth daily.   Yes Historical Provider, MD  omeprazole (PRILOSEC) 20 MG capsule Take 20 mg by mouth every morning.    Yes Historical Provider, MD  pravastatin (PRAVACHOL) 20 MG tablet TAKE 1 TABLET EVERY EVENING 06/11/14  Yes Jettie Booze, MD  predniSONE (DELTASONE) 5 MG tablet Take 2.5 mg by mouth daily with breakfast.    Yes Historical Provider, MD  traMADol (ULTRAM) 50 MG tablet Take 1 tablet (50 mg total) by mouth every 6 (six) hours as needed for severe pain. 08/06/14  Yes Velvet Bathe, MD  warfarin (COUMADIN) 5 MG tablet Take 2.5-5 mg by mouth daily. Take 2.5mg  on Thursday. Take 5mg  rest of days   Yes Historical Provider, MD  warfarin (COUMADIN) 5 MG tablet TAKE 1 TABLET BY MOUTH DAILY OR AS  DIRECTED Patient not taking: Reported on 04/23/2015 03/08/15   Jettie Booze, MD    Allergies:   Allergies  Allergen Reactions  . Arthrotec [Diclofenac-Misoprostol] Diarrhea  . Erythromycin     Anaphylaxis   . Morphine Sulfate Other (See Comments)    Erratic behavior; hallucinations  . Gold-Containing Drug Products Rash  . Macrodantin [Nitrofurantoin Macrocrystal] Rash  . Naproxen Rash  . Penicillins Rash    amoxicillin    Social History:  Ambulatory   independently   Lives at home   With family     reports that she has never smoked. She has never used smokeless tobacco. She reports that she does not drink alcohol or use illicit drugs.    Family History: family history includes Healthy in her sister; Heart disease in her father, mother, sister, and sister; Other in her mother.    Physical Exam: Patient Vitals for the past 24 hrs:  BP Temp Temp src Pulse Resp SpO2  04/24/15 0200 130/78 mmHg - - 106 (!) 28 97 %  04/24/15 0130 121/92 mmHg - - 75 26 91 %  04/24/15 0115 131/85 mmHg - - (!) 121 (!) 27  97 %  04/24/15 0100 126/63 mmHg - - 84 22 98 %  04/24/15 0030 121/76 mmHg - - 95 21 95 %  04/24/15 0015 - - - 75 21 95 %  04/23/15 2315 121/89 mmHg - - - 22 -  04/23/15 2300 (!) 122/54 mmHg - - 70 21 96 %  04/23/15 2245 119/73 mmHg - - 68 (!) 27 97 %  04/23/15 2230 132/98 mmHg - - 87 23 97 %  04/23/15 2130 152/82 mmHg 98.1 F (36.7 C) Oral 87 20 93 %    1. General:  in No Acute distress 2. Psychological: Alert and  Oriented 3. Head/ENT:    Dry Mucous Membranes                          Head Non traumatic, neck supple                          Normal   Dentition 4. SKIN:  decreased Skin turgor,  Skin clean Dry and intact no rash 5. Heart: Regular rate and rhythm no  Murmur, Rub or gallop 6. Lungs: Clear to auscultation bilaterally, no wheezes or crackles   7. Abdomen: Soft, non-tender, Non distended 8. Lower extremities: no clubbing, cyanosis, or edema 9.  Neurologically Grossly intact, moving all 4 extremities equally 10. MSK: Normal range of motion  body mass index is unknown because there is no weight on file.   Labs on Admission:   Results for orders placed or performed during the hospital encounter of 04/23/15 (from the past 24 hour(s))  Basic metabolic panel     Status: Abnormal   Collection Time: 04/23/15 10:05 PM  Result Value Ref Range   Sodium 142 135 - 145 mmol/L   Potassium 2.8 (L) 3.5 - 5.1 mmol/L   Chloride 104 101 - 111 mmol/L   CO2 28 22 - 32 mmol/L   Glucose, Bld 156 (H) 65 - 99 mg/dL   BUN 14 6 - 20 mg/dL   Creatinine, Ser 0.97 0.44 - 1.00 mg/dL   Calcium 9.5 8.9 - 10.3 mg/dL   GFR calc non Af Amer 57 (L) >60 mL/min   GFR calc Af Amer >60 >60 mL/min   Anion gap 10 5 - 15  CBC     Status: Abnormal   Collection Time: 04/23/15 10:05 PM  Result Value Ref Range   WBC 8.7 4.0 - 10.5 K/uL   RBC 4.12 3.87 - 5.11 MIL/uL   Hemoglobin 12.8 12.0 - 15.0 g/dL   HCT 38.8 36.0 - 46.0 %   MCV 94.2 78.0 - 100.0 fL   MCH 31.1 26.0 - 34.0 pg   MCHC 33.0 30.0 - 36.0 g/dL   RDW 16.8 (H) 11.5 - 15.5 %   Platelets 214 150 - 400 K/uL  I-stat troponin, ED     Status: None   Collection Time: 04/23/15 10:10 PM  Result Value Ref Range   Troponin i, poc 0.00 0.00 - 0.08 ng/mL   Comment 3          Magnesium     Status: Abnormal   Collection Time: 04/24/15 12:06 AM  Result Value Ref Range   Magnesium 1.4 (L) 1.7 - 2.4 mg/dL  Protime-INR     Status: Abnormal   Collection Time: 04/24/15 12:06 AM  Result Value Ref Range   Prothrombin Time 24.1 (H) 11.6 - 15.2 seconds   INR 2.19 (H)  0.00 - 1.49    UA not obtained   Lab Results  Component Value Date   HGBA1C 6.5* 08/03/2014    CrCl cannot be calculated (Unknown ideal weight.).  BNP (last 3 results) No results for input(s): PROBNP in the last 8760 hours.  Other results:  I have pearsonaly reviewed this: ECG REPORT  Rate: 103  Rhythm: a.fib  ST&T Change: no ischemic  changes QTC 520  There were no vitals filed for this visit.   Cultures:    Component Value Date/Time   SDES URINE, CLEAN CATCH 04/07/2010 2138   SDES URINE, CLEAN CATCH 04/07/2010 2138   SPECREQUEST NONE 04/07/2010 2138   SPECREQUEST NONE 04/07/2010 2138   CULT  04/07/2010 2138    Multiple bacterial morphotypes present, none predominant. Suggest appropriate recollection if clinically indicated.   REPTSTATUS 04/08/2010 FINAL 04/07/2010 2138   REPTSTATUS 04/09/2010 FINAL 04/07/2010 2138     Radiological Exams on Admission: Dg Chest 2 View  04/23/2015  CLINICAL DATA:  Chest pain, weakness, shortness of breath and dizziness. Symptoms for 1 day. EXAM: CHEST  2 VIEW COMPARISON:  08/04/2014 FINDINGS: Lung volumes remain low. Stable cardiomegaly. There is mild interstitial thickening concerning for edema. Atelectasis or scarring in the right lower lobe. No large pleural effusion. No pneumothorax. IMPRESSION: Cardiomegaly with mild interstitial edema. Electronically Signed   By: Jeb Levering M.D.   On: 04/23/2015 22:25    Chart has been reviewed  Family   at  Bedside  plan of care was discussed with Grand-Daughters   Assessment/Plan72 year old female history of atrial fibrillation and hypertension presents with exertional chest pain in the setting of significant stress  Present on Admission:  . Chest pain seems to be somewhat atypical most likely musculoskeletal but given risk factors will admit to telemetry cycle cardiac enzymes obtain echogram cardiology to be notified in the morning the patient is here  . Atrial fibrillation ([place]) chronic continue home medications including Coumadin  . Hypertension chronic currently stable continue home medications  . Hypokalemia replace and monitor on telemetry  . Hypomagnesemia we will replace and monitor Prolonged QT await QT prolonging medications monitor on telemetry  Prophylaxis: on coumadin  CODE STATUS:  FULL CODE as per  patient  Disposition:   To home once workup is complete and patient is stable  Other plan as per orders.  I have spent a total of 56 min on this admission   [name] 04/24/2015, 2:57 AM    [facility]  Pager [phone]   after 2 AM please page floor coverage PA If 7AM-7PM, please contact the day team taking care of the patient  [email or web address]  Password [email or web address]

## 2015-04-24 NOTE — ED Notes (Signed)
Patient has had increased respirations for the past hour. Denies any anxiety at this time. Does not show any signs of hyperventilation

## 2015-04-24 NOTE — ED Notes (Signed)
Attempted to call report. Nurse unable to take report at this time.

## 2015-04-25 DIAGNOSIS — I10 Essential (primary) hypertension: Secondary | ICD-10-CM

## 2015-04-25 DIAGNOSIS — R079 Chest pain, unspecified: Secondary | ICD-10-CM | POA: Diagnosis not present

## 2015-04-25 DIAGNOSIS — I482 Chronic atrial fibrillation: Secondary | ICD-10-CM

## 2015-04-25 DIAGNOSIS — R0789 Other chest pain: Secondary | ICD-10-CM | POA: Diagnosis not present

## 2015-04-25 DIAGNOSIS — M0579 Rheumatoid arthritis with rheumatoid factor of multiple sites without organ or systems involvement: Secondary | ICD-10-CM

## 2015-04-25 DIAGNOSIS — E876 Hypokalemia: Secondary | ICD-10-CM | POA: Diagnosis not present

## 2015-04-25 DIAGNOSIS — R0609 Other forms of dyspnea: Secondary | ICD-10-CM

## 2015-04-25 DIAGNOSIS — I272 Other secondary pulmonary hypertension: Secondary | ICD-10-CM

## 2015-04-25 DIAGNOSIS — I481 Persistent atrial fibrillation: Secondary | ICD-10-CM

## 2015-04-25 LAB — BLOOD GAS, ARTERIAL
Acid-Base Excess: 1.7 mmol/L (ref 0.0–2.0)
Bicarbonate: 25.7 mEq/L — ABNORMAL HIGH (ref 20.0–24.0)
Drawn by: 21338
O2 Content: 2 L/min
O2 Saturation: 97 %
PO2 ART: 84.8 mmHg (ref 80.0–100.0)
Patient temperature: 98.6
TCO2: 27 mmol/L (ref 0–100)
pCO2 arterial: 40.6 mmHg (ref 35.0–45.0)
pH, Arterial: 7.419 (ref 7.350–7.450)

## 2015-04-25 LAB — PROTIME-INR
INR: 2.37 — ABNORMAL HIGH (ref 0.00–1.49)
PROTHROMBIN TIME: 25.6 s — AB (ref 11.6–15.2)

## 2015-04-25 MED ORDER — METOPROLOL SUCCINATE ER 25 MG PO TB24
25.0000 mg | ORAL_TABLET | Freq: Every evening | ORAL | Status: DC
  Administered 2015-04-25 – 2015-04-26 (×2): 25 mg via ORAL
  Filled 2015-04-25 (×2): qty 1

## 2015-04-25 NOTE — Consult Note (Signed)
 Name: [name] MRN: [id number] DOB: 12/10/1940    ADMISSION DATE:  04/23/2015 CONSULTATION DATE:  04/25/2015  REFERRING MD :  Vega, O  CHIEF COMPLAINT:  Shortness of breath  HISTORY OF PRESENT ILLNESS:  74F admitted 04/23/15 with complaints of palpitations, chest pain, shortness of breath and nausea. Since admission, she has been ruled out for acute ischemia with normal troponins and no acute changes on ECG. Her echo showed a moderately elevated RVSP at 48mmHg. A trivial pericardial effusion was also noted. She has a history of chronic atrial fibrillation on rate control and anticoagulation with warfarin. Other PMH is significant for hypertension and RA (on Enbrel, MTX, prednisone 2.5mg daily). She is a never smoker.   Today she reports that her symptoms are essentially unchanged since admission. She continues to have dyspnea with exertion accompanied by pleuritic type chest pain that she feels in the front of her chest and between her shoulder blades. She can make it to the bathroom and back, but not much further. She reports that she has been under incredible amounts of stress with a husband who has end-stage heart failure, a neighbor just diagnosed with cancer, the man she cares for through work entering hospice, her sister currently being inpatient and her brother-in-law having just died unexpectedly. She attributes much of her symptomatology to stress. It always seems to aggravate her A fib, which in turn causes some of her other symptoms. She was admitted with similar symptoms in July and responded to antibiotics for CAP and a small increase in her prednisone dose. She did well after discharge and really only began having trouble again last week. She starting having a lot of palpitation. She was sleeping more upright than usual and had pain and increased shortness of breath if she tried to sleep in a more recumbent position. She reports some increased abdominal girth and fullness. Minimal,  but present, lower extremity edema. Overall, she says this is what happens when her A fib is not under control and she is stressed.   No cough / sputum / hemoptysis. No fever / chills / night sweats. She intermittently wheezes. She has a history of "bronchial asthma" and "allergies", which both got much better after removing the carpet from her house. She does not use any inhalers. She has been on Enbrel for ~18 years and MTX for ~20years. 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   PAST MEDICAL HISTORY :   has a past medical history of Esophageal reflux; Rheumatoid arthritis (HCC) (1975); Osteopenia; Polymorphic light eruption (2004); Hyperlipidemia; Adhesive capsulitis of left shoulder (1980); Right middle ear infection (1986); Acne rosacea; Allergic rhinitis; Hypercholesterolemia; Atrial fibrillation (HCC); Hypertension; Edema; and Prolonged QT interval.  has past surgical history that includes Cardiac catheterization; Cardioversion; Temporomandibular joint surgery; and Abdominal hysterectomy. Prior to Admission medications   Medication Sig Start Date End Date Taking? Authorizing Provider  acidophilus (RISAQUAD) CAPS capsule Take 1 capsule by mouth daily.   Yes Historical Provider, MD  cholecalciferol (VITAMIN D) 1000 UNITS tablet Take 2,000 Units by mouth daily.   Yes Historical Provider, MD  Etanercept (ENBREL [place]) Take one (1) injection subcutaneous every Friday.   Yes Historical Provider, MD  fluticasone (FLONASE) 50 MCG/ACT nasal spray Place 2 sprays into both nostrils daily as needed for allergies or rhinitis.    Yes Historical Provider, MD  folic acid (FOLVITE) 1 MG tablet Take 2 mg by mouth daily.    Yes Historical Provider, MD    furosemide (LASIX) 40 MG tablet Take 20 mg by mouth daily.    Yes Historical Provider, MD  furosemide (LASIX) 40 MG tablet TAKE 1 TABLET BY MOUTH EVERY DAY OR AS DIRECTED 12/03/14  Yes Jayadeep S Varanasi, MD  hydroxypropyl methylcellulose /  hypromellose (ISOPTO TEARS / GONIOVISC) 2.5 % ophthalmic solution Place 1 drop into both eyes 4 (four) times daily as needed for dry eyes.   Yes Historical Provider, MD  methotrexate (RHEUMATREX) 2.5 MG tablet Take three (3) tablets (7.5 mg total) by mouth every Tuesday; Take two (2) tablets (5 mg total) by mouth every Wednesday. 08/29/14  Yes Historical Provider, MD  metoprolol succinate (TOPROL-XL) 50 MG 24 hr tablet Take 1 tablet (50 mg total) by mouth daily. Take with or immediately following a meal. 01/19/15  Yes Jayadeep S Varanasi, MD  Multiple Vitamins-Minerals (MULTIVITAMIN WITH MINERALS) tablet Take 1 tablet by mouth daily.   Yes Historical Provider, MD  omeprazole (PRILOSEC) 20 MG capsule Take 20 mg by mouth every morning.    Yes Historical Provider, MD  pravastatin (PRAVACHOL) 20 MG tablet TAKE 1 TABLET EVERY EVENING 06/11/14  Yes Jayadeep S Varanasi, MD  predniSONE (DELTASONE) 5 MG tablet Take 2.5 mg by mouth daily with breakfast.    Yes Historical Provider, MD  traMADol (ULTRAM) 50 MG tablet Take 1 tablet (50 mg total) by mouth every 6 (six) hours as needed for severe pain. 08/06/14  Yes Orlando Vega, MD  warfarin (COUMADIN) 5 MG tablet Take 2.5-5 mg by mouth daily. Take 2.5mg on Thursday. Take 5mg rest of days   Yes Historical Provider, MD  warfarin (COUMADIN) 5 MG tablet TAKE 1 TABLET BY MOUTH DAILY OR AS DIRECTED Patient not taking: Reported on 04/23/2015 03/08/15   Jayadeep S Varanasi, MD   Allergies  Allergen Reactions  . Arthrotec [Diclofenac-Misoprostol] Diarrhea  . Erythromycin     Anaphylaxis   . Morphine Sulfate Other (See Comments)    Erratic behavior; hallucinations  . Gold-Containing Drug Products Rash  . Macrodantin [Nitrofurantoin Macrocrystal] Rash  . Naproxen Rash  . Penicillins Rash    amoxicillin    FAMILY HISTORY:  family history includes Healthy in her sister; Heart disease in her father, mother, sister, and sister; Other in her mother. SOCIAL HISTORY:   reports that she has never smoked. She has never used smokeless tobacco. She reports that she does not drink alcohol or use illicit drugs.  REVIEW OF SYSTEMS:   General: -18lbs over the last several months, attributed to stress. no fever, no chills Cardiovascular: see HPI Respiratory: see HPI Gastrointestinal: + constipation while in the hospital. no nausea, vomiting, diarrhea Genitourinary: no pain with urination, no foul odor, no frequency, no urgency Musculoskeletal: no joint pain or swelling above baseline, no muscle aches Skin: no rashes, sores, or ulcers Endocrine: no blood sugar problems, no thyroid problems Neurologic: no numbness, tingling, dizziness, or visual changes Hematologic: no bleeding, bruising, or clotting problems Psychiatric: + major life stressors. no depression or anxiety, no suicidal ideation or intent   PHYSICAL EXAM: Temp:  [97.5 F (36.4 C)-98.7 F (37.1 C)] 98.7 F (37.1 C) (04/09 1700) Pulse Rate:  [90-99] 99 (04/09 1700) Resp:  [18-20] 20 (04/09 1700) BP: (110-121)/(55-81) 121/55 mmHg (04/09 1700) SpO2:  [95 %-99 %] 98 % (04/09 1700) Body mass index is 26.99 kg/(m^2).  General Well nourished, well developed, no apparent distress  HEENT No gross abnormalities. Oropharynx clear. Mallampati IV. Good dentition.   Pulmonary Diminished breath sounds bilaterally with bibasilar   rales R>L. No ronchi or wheezes. Effort limited by pleuritic pain. Symmetrical expansion. No chest wall tenderness to palpation.   Cardiovascular Irregularly irregular rhythm. S1, s2. No m/r/g. Distal pulses palpable.  Abdomen Soft, non-tender, mildly distended, positive bowel sounds, no palpable organomegaly or masses. Normoresonant to percussion.  Musculoskeletal + bony changes related to longstanding RA. Otherwise moves well.   Lymphatics No cervical, supraclavicular or axillary adenopathy.   Neurologic Grossly intact. No focal deficits.   Skin/Integuement No rash, no cyanosis, no  clubbing. Trace bipedal edema.       Recent Labs Lab 04/23/15 2205 04/24/15 0445  NA 142 142  K 2.8* 3.6  CL 104 106  CO2 28 25  BUN 14 11  CREATININE 0.97 0.75  GLUCOSE 156* 119*    Recent Labs Lab 04/23/15 2205 04/24/15 0445  HGB 12.8 11.9*  HCT 38.8 38.2  WBC 8.7 10.0  PLT 214 199   STUDIES:  TTE 04/24/15 LVEF 55-60% Normal LV size and function Unable to assess diastolic dysfunction RVSP 48mmHg (increased from 37 08/04/14) RV normal size and function No significant valvular abnormalities.   CXR 04/23/15 Nothing acute. Mild interstitial prominence.   CTA Chest 08/03/14 No pulmonary emboli. Scattered ground glass.  Small left effusion.  Bibasilar atelectasis.  ASSESSMENT / PLAN:  74F w/ shortness of breath and elevated RVSP on TTE; concern for pulmonary hypertension. History significant for HTN, chronic A fib, rheumatoid arthritis on Enbrel/MTX, HLD. Never smoker. CT from July without evidence of underlying parenchymal disease. Her pulmonary hypertension is most likely related to her underlying cardiac disease (A fib), although her autoimmune disease could be contributing. She will need outpatient PFTs with spirometry, lung volumes and DLCO. She may benefit from diuresis given rales, abdominal fullness and trace edema on exam. There may be a component of serositis here, as her pleuritic chest pain seems out of proportion to her imaging and she has a pericardial effusion, as well. She might benefit from a brief course of higher dose prednisone. It is very unlikely that she would have WHO Group I disease (PAH) and therefore is unlikely to benefit from treatment with PDE5 inhibitors, prostacyclins or endothelin receptor antagonists. If she truly has pHTN, it would more likely be Group III or IV. Should her symptoms persist despite maximal medical management of her comorbid conditions, she would need a RHC for definitive diagnosis.    Diuretic challenge  Continued titration  of A fib management  Check CRP, ESR to evaluate for underlying inflammatory process  Consider NSAIDs for pleurisy (ketorolac often works well if renal function can tolerate)  Encourage ambulation   Ambulatory O2 sats / oxygen titration prior to discharge  Outpatient PFTs, 6MWT  Repeat echo once dry weight achieved  Consideration of RHC for definitive dx if symptoms persist  Consider repeat CT imaging if PFTs suggestive of significant restriction or impaired DLCO  If symptoms slow to improve may benefit from cardiopulmonary rehab referral  Outpatient follow up with pulmonary  Thank you for involving us in this patient's care. Pulmonary will continue to follow.   Sarah Ellen E. Stephens, MD Pulmonary and Critical Care Medicine [facility] Pager: [phone]  04/25/2015, 8:32 PM   

## 2015-04-25 NOTE — Progress Notes (Signed)
[facility] PROGRESS NOTE  [name] [id number] DOB: September 18, 1940 DOA: 04/23/2015 PCP: Lottie Dawson, MD  Assessment/Plan: Active Problems:   Atrial fibrillation ([place]) - anticoagulated on warfarin - Rate controlled on metoprolol    Hypertension - Currently on metoprolol    Chest pain - Patient had negative troponins - Consulted cardiology - echo reviewed  Pulmonary htn - moderate on echo - consulted pulmonology    Hypokalemia - Resolved    Hypomagnesemia - Resolved    Rheumatoid arthritis ([place]) -Continue pain control and supportive therapy   Code Status: Full Family Communication: none at bedside Disposition Plan: Pending improvement in condition.   Consultants:  Cardiology  Procedures:  None  Antibiotics:  None  HPI/Subjective: Pt has no new complaints. Still sob with activity  Objective: Filed Vitals:   04/24/15 2037 04/25/15 0407  BP: 119/60 110/81  Pulse: 90 92  Temp: 97.5 F (36.4 C) 98.7 F (37.1 C)  Resp: 18 18   No intake or output data in the 24 hours ending 04/25/15 1645 Filed Weights   04/24/15 0420  Weight: 69.1 kg (152 lb 5.4 oz)    Exam:   General:  Pt in nad, alert and awake  Cardiovascular: rrr, no rubs  Respiratory: no increased wob, no wheezes  Abdomen: soft, nd, nt  Musculoskeletal: no cyanosis or clubbing   Data Reviewed: Basic Metabolic Panel:  Recent Labs Lab 04/23/15 2205 04/24/15 0006 04/24/15 0445  NA 142  --  142  K 2.8*  --  3.6  CL 104  --  106  CO2 28  --  25  GLUCOSE 156*  --  119*  BUN 14  --  11  CREATININE 0.97  --  0.75  CALCIUM 9.5  --  8.9  MG  --  1.4* 2.4  PHOS  --   --  3.0   Liver Function Tests:  Recent Labs Lab 04/24/15 0445  AST 37  ALT 48  ALKPHOS 59  BILITOT 0.8  PROT 6.2*  ALBUMIN 3.4*   No results for input(s): LIPASE, AMYLASE in the last 168 hours. No results for input(s): AMMONIA in the last 168 hours. CBC:  Recent Labs Lab  04/23/15 2205 04/24/15 0445  WBC 8.7 10.0  HGB 12.8 11.9*  HCT 38.8 38.2  MCV 94.2 94.8  PLT 214 199   Cardiac Enzymes:  Recent Labs Lab 04/24/15 0445 04/24/15 1118 04/24/15 1631  TROPONINI <0.03 <0.03 <0.03   BNP (last 3 results) No results for input(s): BNP in the last 8760 hours.  ProBNP (last 3 results) No results for input(s): PROBNP in the last 8760 hours.  CBG: No results for input(s): GLUCAP in the last 168 hours.  No results found for this or any previous visit (from the past 240 hour(s)).   Studies: Dg Chest 2 View  04/23/2015  CLINICAL DATA:  Chest pain, weakness, shortness of breath and dizziness. Symptoms for 1 day. EXAM: CHEST  2 VIEW COMPARISON:  08/04/2014 FINDINGS: Lung volumes remain low. Stable cardiomegaly. There is mild interstitial thickening concerning for edema. Atelectasis or scarring in the right lower lobe. No large pleural effusion. No pneumothorax. IMPRESSION: Cardiomegaly with mild interstitial edema. Electronically Signed   By: Jeb Levering M.D.   On: 04/23/2015 22:25    Scheduled Meds: . folic acid  2 mg Oral Daily  . metoprolol succinate  25 mg Oral QPM  . metoprolol succinate  50 mg Oral Daily  . pravastatin  20 mg Oral QPM  .  predniSONE  2.5 mg Oral Q breakfast  . sodium chloride flush  3 mL Intravenous Q12H  . [START ON 04/29/2015] warfarin  2.5 mg Oral Q Thu-1800  . warfarin  5 mg Oral Once per day on Sun Mon Tue Wed Fri Sat  . Warfarin - Pharmacist Dosing Inpatient   Does not apply q1800   Continuous Infusions:   Time spent: > 35 minutes  Velvet Bathe  [facility] Pager [phone] If 7PM-7AM, please contact night-coverage at [email or web address], password [facility] 04/25/2015, 4:45 PM

## 2015-04-25 NOTE — Progress Notes (Addendum)
ANTICOAGULATION CONSULT NOTE - Follow Up Consult  Pharmacy Consult for Coumadin Indication: atrial fibrillation  Allergies  Allergen Reactions  . Arthrotec [Diclofenac-Misoprostol] Diarrhea  . Erythromycin     Anaphylaxis   . Morphine Sulfate Other (See Comments)    Erratic behavior; hallucinations  . Gold-Containing Drug Products Rash  . Macrodantin [Nitrofurantoin Macrocrystal] Rash  . Naproxen Rash  . Penicillins Rash    amoxicillin    Patient Measurements: Height: 5\' 3"  (160 cm) Weight: 152 lb 5.4 oz (69.1 kg) IBW/kg (Calculated) : 52.4  Vital Signs: Temp: 98.7 F (37.1 C) (04/09 0407) Temp Source: Oral (04/09 0407) BP: 110/81 mmHg (04/09 0407) Pulse Rate: 92 (04/09 0407)  Labs:  Recent Labs  04/22/15 1456 04/23/15 2205 04/24/15 0006 04/24/15 0445 04/24/15 1118 04/24/15 1631 04/25/15 0321  HGB  --  12.8  --  11.9*  --   --   --   HCT  --  38.8  --  38.2  --   --   --   PLT  --  214  --  199  --   --   --   LABPROT  --   --  24.1*  --   --   --  25.6*  INR 2.8  --  2.19*  --   --   --  2.37*  CREATININE  --  0.97  --  0.75  --   --   --   TROPONINI  --   --   --  <0.03 <0.03 <0.03  --     Estimated Creatinine Clearance: 58.4 mL/min (by C-G formula based on Cr of 0.75).  Assessment: 74yof continues on coumadin for afib. INR therapeutic at 2.72 on her home dose of 5mg  daily except 2.5mg  on Thursday.   Goal of Therapy:  INR 2-3 Monitor platelets by anticoagulation protocol: Yes   Plan:  1) Continue home dose 2) Daily INR  Sloan Leiter, PharmD, BCPS Clinical Pharmacist [phone]  04/25/2015,11:19 AM

## 2015-04-25 NOTE — Consult Note (Signed)
CARDIOLOGY CONSULT NOTE  Patient ID: [name] MRN: [id number] DOB/AGE: 1940/12/14 75 y.o.  Admit date: 04/23/2015 Primary Physician Shamleffer, Herschell Dimes, MD Consulting MD: Wendee Beavers Primary cardiologist: Irish Lack Consulting cardiologist: Bronson Ing  Reason for Consultation: chest pain, SOB  HPI: The patient is a 75 yr old woman followed by Dr. Irish Lack for chronic atrial fibrillation in which a rate control and anticoagulation strategy has been pursued. She has a h/o nosebleeds requiring cauterization. She also has a h/o hypertension and ankle edema along with rheumatoid arthritis and takes methotrexate and Etanercept.   She was admitted with palpitations and left inframammary chest pain radiating into the left arm and back associated with shortness of breath and nausea. Chest pain worsened with deep inspiration and moving left arm. Patient under a lot of home stressors (husband hospitalized as well).  Hgb normal, hypokalemic (2.8, repleted), hypomagnesemic (repleted), nl TSH, and normal troponins. Chest xray showed mild interstitial edema.  Echocardiogram on 04/24/15 showed normal LV systolic function and regional wall motion, EF 55-60%, with mild mitral and tricuspid regurgitation, trivial pericardial effusion, and moderately elevated pulmonary pressures (48 mmHg, 37 mmHg by echo on 08/04/14).  Normal nuclear MPI study on 02/22/07.  ECG on admission showed HR 103 bpm, most recently 92 bpm (atrial fibrillation).  Says she gets exertional dyspnea walking to the bathroom and then feels jittery.    Allergies  Allergen Reactions  . Arthrotec [Diclofenac-Misoprostol] Diarrhea  . Erythromycin     Anaphylaxis   . Morphine Sulfate Other (See Comments)    Erratic behavior; hallucinations  . Gold-Containing Drug Products Rash  . Macrodantin [Nitrofurantoin Macrocrystal] Rash  . Naproxen Rash  . Penicillins Rash    amoxicillin    Current Facility-Administered  Medications  Medication Dose Route Frequency Provider Last Rate Last Dose  . acetaminophen (TYLENOL) tablet 650 mg  650 mg Oral Q6H PRN Toy Baker, MD   650 mg at 04/24/15 1212   Or  . acetaminophen (TYLENOL) suppository 650 mg  650 mg Rectal Q6H PRN Toy Baker, MD      . fluticasone (FLONASE) 50 MCG/ACT nasal spray 2 spray  2 spray Each Nare Daily PRN Toy Baker, MD      . folic acid (FOLVITE) tablet 2 mg  2 mg Oral Daily Toy Baker, MD   2 mg at 04/25/15 1055  . HYDROcodone-acetaminophen (NORCO/VICODIN) 5-325 MG per tablet 1-2 tablet  1-2 tablet Oral Q4H PRN Toy Baker, MD   2 tablet at 04/25/15 1054  . methocarbamol (ROBAXIN) 500 mg in dextrose 5 % 50 mL IVPB  500 mg Intravenous Q6H PRN Toy Baker, MD      . metoprolol succinate (TOPROL-XL) 24 hr tablet 50 mg  50 mg Oral Daily Toy Baker, MD   50 mg at 04/25/15 1055  . pravastatin (PRAVACHOL) tablet 20 mg  20 mg Oral QPM Toy Baker, MD   20 mg at 04/24/15 1852  . predniSONE (DELTASONE) tablet 2.5 mg  2.5 mg Oral Q breakfast Toy Baker, MD   2.5 mg at 04/25/15 0647  . sodium chloride flush (NS) 0.9 % injection 3 mL  3 mL Intravenous Q12H Toy Baker, MD   3 mL at 04/25/15 1000  . [START ON 04/29/2015] warfarin (COUMADIN) tablet 2.5 mg  2.5 mg Oral Q Thu-1800 Veronda P Bryk, RPH      . warfarin (COUMADIN) tablet 5 mg  5 mg Oral Once per day on Sun Mon Tue Wed Fri Sat Charleston Ropes  Rolly Salter, RPH   5 mg at 04/24/15 1852  . Warfarin - Pharmacist Dosing Inpatient   Does not apply q1800 Laren Everts, [facility]        Past Medical History  Diagnosis Date  . Esophageal reflux   . Rheumatoid arthritis ([place]) 1975  . Osteopenia   . Polymorphic light eruption 2004  . Hyperlipidemia   . Adhesive capsulitis of left shoulder 1980  . Right middle ear infection 1986  . Acne rosacea   . Allergic rhinitis   . Hypercholesterolemia   . Atrial fibrillation ([place])   . Hypertension   . Edema    . Prolonged QT interval     Past Surgical History  Procedure Laterality Date  . Cardiac catheterization    . Cardioversion    . Temporomandibular joint surgery    . Abdominal hysterectomy      Social History   Social History  . Marital Status: Married    Spouse Name: N/A  . Number of Children: N/A  . Years of Education: N/A   Occupational History  . Not on file.   Social History Main Topics  . Smoking status: Never Smoker   . Smokeless tobacco: Never Used  . Alcohol Use: No  . Drug Use: No  . Sexual Activity: Not on file   Other Topics Concern  . Not on file   Social History Narrative     No family history of premature CAD in 1st degree relatives.  Prior to Admission medications   Medication Sig Start Date End Date Taking? Authorizing Provider  acidophilus (RISAQUAD) CAPS capsule Take 1 capsule by mouth daily.   Yes Historical Provider, MD  cholecalciferol (VITAMIN D) 1000 UNITS tablet Take 2,000 Units by mouth daily.   Yes Historical Provider, MD  Etanercept (ENBREL [place]) Take one (1) injection subcutaneous every Friday.   Yes Historical Provider, MD  fluticasone (FLONASE) 50 MCG/ACT nasal spray Place 2 sprays into both nostrils daily as needed for allergies or rhinitis.    Yes Historical Provider, MD  folic acid (FOLVITE) 1 MG tablet Take 2 mg by mouth daily.    Yes Historical Provider, MD  furosemide (LASIX) 40 MG tablet Take 20 mg by mouth daily.    Yes Historical Provider, MD  furosemide (LASIX) 40 MG tablet TAKE 1 TABLET BY MOUTH EVERY DAY OR AS DIRECTED 12/03/14  Yes Jettie Booze, MD  hydroxypropyl methylcellulose / hypromellose (ISOPTO TEARS / GONIOVISC) 2.5 % ophthalmic solution Place 1 drop into both eyes 4 (four) times daily as needed for dry eyes.   Yes Historical Provider, MD  methotrexate (RHEUMATREX) 2.5 MG tablet Take three (3) tablets (7.5 mg total) by mouth every Tuesday; Take two (2) tablets (5 mg total) by mouth every Wednesday. 08/29/14  Yes  Historical Provider, MD  metoprolol succinate (TOPROL-XL) 50 MG 24 hr tablet Take 1 tablet (50 mg total) by mouth daily. Take with or immediately following a meal. 01/19/15  Yes Jettie Booze, MD  Multiple Vitamins-Minerals (MULTIVITAMIN WITH MINERALS) tablet Take 1 tablet by mouth daily.   Yes Historical Provider, MD  omeprazole (PRILOSEC) 20 MG capsule Take 20 mg by mouth every morning.    Yes Historical Provider, MD  pravastatin (PRAVACHOL) 20 MG tablet TAKE 1 TABLET EVERY EVENING 06/11/14  Yes Jettie Booze, MD  predniSONE (DELTASONE) 5 MG tablet Take 2.5 mg by mouth daily with breakfast.    Yes Historical Provider, MD  traMADol (ULTRAM) 50 MG tablet Take 1  tablet (50 mg total) by mouth every 6 (six) hours as needed for severe pain. 08/06/14  Yes Velvet Bathe, MD  warfarin (COUMADIN) 5 MG tablet Take 2.5-5 mg by mouth daily. Take 2.5mg  on Thursday. Take 5mg  rest of days   Yes Historical Provider, MD  warfarin (COUMADIN) 5 MG tablet TAKE 1 TABLET BY MOUTH DAILY OR AS DIRECTED Patient not taking: Reported on 04/23/2015 03/08/15   Jettie Booze, MD     Review of systems complete and found to be negative unless listed above in HPI     Physical exam Blood pressure 110/81, pulse 92, temperature 98.7 F (37.1 C), temperature source Oral, resp. rate 18, height 5\' 3"  (1.6 m), weight 152 lb 5.4 oz (69.1 kg), SpO2 99 %. General: NAD, anxious Neck: No JVD, no thyromegaly or thyroid nodule.  Lungs: diminished throughout, no rales/wheezes. CV: Nondisplaced PMI. Regular rate and irregular rhythm, normal S1/S2, no S3, no murmur.  No peripheral edema.    Abdomen: Soft, nontender, no distention.  Skin: Intact without lesions or rashes.  Neurologic: Alert and oriented x 3.  Psych: Anxious affect. HEENT: Normal.   ECG: Most recent ECG reviewed.  Labs:   Lab Results  Component Value Date   WBC 10.0 04/24/2015   HGB 11.9* 04/24/2015   HCT 38.2 04/24/2015   MCV 94.8 04/24/2015   PLT  199 04/24/2015    Recent Labs Lab 04/24/15 0445  NA 142  K 3.6  CL 106  CO2 25  BUN 11  CREATININE 0.75  CALCIUM 8.9  PROT 6.2*  BILITOT 0.8  ALKPHOS 59  ALT 48  AST 37  GLUCOSE 119*   Lab Results  Component Value Date   CKTOTAL 23 04/04/2010   CKMB 0.8 04/04/2010   TROPONINI <0.03 04/24/2015    Lab Results  Component Value Date   CHOL 145 05/21/2013   CHOL  04/05/2010    117        ATP III CLASSIFICATION:  <200     mg/dL   Desirable  200-239  mg/dL   Borderline High  >=240    mg/dL   High          Lab Results  Component Value Date   HDL 36.60* 05/21/2013   HDL 38* 04/05/2010   Lab Results  Component Value Date   LDLCALC 79 05/21/2013   [place]  04/05/2010    66        Total Cholesterol/HDL:CHD Risk Coronary Heart Disease Risk Table                     Men   Women  1/2 Average Risk   3.4   3.3  Average Risk       5.0   4.4  2 X Average Risk   9.6   7.1  3 X Average Risk  23.4   11.0        Use the calculated Patient Ratio above and the CHD Risk Table to determine the patient's CHD Risk.        ATP III CLASSIFICATION (LDL):  <100     mg/dL   Optimal  100-129  mg/dL   Near or Above                    Optimal  130-159  mg/dL   Borderline  160-189  mg/dL   High  >190     mg/dL   Very High   Lab Results  Component Value Date   TRIG 148.0 05/21/2013   TRIG 66 04/05/2010   Lab Results  Component Value Date   CHOLHDL 4 05/21/2013   CHOLHDL 3.1 04/05/2010   No results found for: LDLDIRECT       Studies: Dg Chest 2 View  04/23/2015  CLINICAL DATA:  Chest pain, weakness, shortness of breath and dizziness. Symptoms for 1 day. EXAM: CHEST  2 VIEW COMPARISON:  08/04/2014 FINDINGS: Lung volumes remain low. Stable cardiomegaly. There is mild interstitial thickening concerning for edema. Atelectasis or scarring in the right lower lobe. No large pleural effusion. No pneumothorax. IMPRESSION: Cardiomegaly with mild interstitial edema. Electronically  Signed   By: Jeb Levering M.D.   On: 04/23/2015 22:25    ASSESSMENT AND PLAN:  1. Shortness of breath: May be due to elevated HR with atrial fibrillation. BP low normal. Will increase Toprol-XL to 50 mg q am and 25 mg q pm. This may have led to mild diastolic decompensation with mild interstitial edema. 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 Would recommend outpatient workup for this. Consider outpatient PFT's.  2. Atrial fibrillation: See #1. Continue warfarin.  3. Pulmonary hypertension: See #1.  4. Essential HTN: Controlled. Monitor given increase in metoprolol dosage as noted above.  5. Chest pain/left arm pain: Atypical. No further workup required regarding ischemic evaluation. Normal nuclear MPI study on 02/22/07.   Signed: Kate Sable, M.D., F.A.C.C.  04/25/2015, 2:41 PM

## 2015-04-26 DIAGNOSIS — R079 Chest pain, unspecified: Secondary | ICD-10-CM | POA: Diagnosis not present

## 2015-04-26 DIAGNOSIS — I481 Persistent atrial fibrillation: Secondary | ICD-10-CM | POA: Diagnosis not present

## 2015-04-26 DIAGNOSIS — R0789 Other chest pain: Secondary | ICD-10-CM | POA: Diagnosis not present

## 2015-04-26 DIAGNOSIS — I10 Essential (primary) hypertension: Secondary | ICD-10-CM | POA: Diagnosis not present

## 2015-04-26 LAB — PROTIME-INR
INR: 2.72 — AB (ref 0.00–1.49)
PROTHROMBIN TIME: 28.4 s — AB (ref 11.6–15.2)

## 2015-04-26 LAB — HEMOGLOBIN A1C
Hgb A1c MFr Bld: 5.8 % — ABNORMAL HIGH (ref 4.8–5.6)
MEAN PLASMA GLUCOSE: 120 mg/dL

## 2015-04-26 MED ORDER — FUROSEMIDE 10 MG/ML IJ SOLN
40.0000 mg | Freq: Once | INTRAMUSCULAR | Status: AC
  Administered 2015-04-26: 40 mg via INTRAVENOUS
  Filled 2015-04-26: qty 4

## 2015-04-26 MED ORDER — LORAZEPAM 0.5 MG PO TABS
0.5000 mg | ORAL_TABLET | Freq: Once | ORAL | Status: AC
  Administered 2015-04-26: 0.5 mg via ORAL
  Filled 2015-04-26: qty 1

## 2015-04-26 NOTE — Progress Notes (Signed)
    SUBJECTIVE:  Still with some SOB and difficulty with deep breathing.      PHYSICAL EXAM Filed Vitals:   04/25/15 0407 04/25/15 1700 04/25/15 2116 04/26/15 0521  BP: 110/81 121/55 118/103 130/70  Pulse: 92 99 84 91  Temp: 98.7 F (37.1 C) 98.7 F (37.1 C) 97.7 F (36.5 C) 98.6 F (37 C)  TempSrc: Oral Oral Oral Oral  Resp: 18 20  20   Height:      Weight:      SpO2: 99% 98% 98% 97%   General:  No acute distress Lungs:  Decreased breath sounds right greater than left Heart:  Irregular Abdomen:  Positive bowel sounds, no rebound no guarding Extremities:  No edema   LABS: Lab Results  Component Value Date   TROPONINI <0.03 04/24/2015   Results for orders placed or performed during the hospital encounter of 04/23/15 (from the past 24 hour(s))  Blood gas, arterial     Status: Abnormal   Collection Time: 04/25/15  3:04 PM  Result Value Ref Range   O2 Content 2.0 L/min   Delivery systems NASAL CANNULA    pH, Arterial 7.419 7.350 - 7.450   pCO2 arterial 40.6 35.0 - 45.0 mmHg   pO2, Arterial 84.8 80.0 - 100.0 mmHg   Bicarbonate 25.7 (H) 20.0 - 24.0 mEq/L   TCO2 27.0 0 - 100 mmol/L   Acid-Base Excess 1.7 0.0 - 2.0 mmol/L   O2 Saturation 97.0 %   Patient temperature 98.6    Collection site LEFT RADIAL    Drawn by [id number]    Sample type ARTERIAL DRAW    Allens test (pass/fail) PASS PASS  Protime-INR     Status: Abnormal   Collection Time: 04/26/15  2:54 AM  Result Value Ref Range   Prothrombin Time 28.4 (H) 11.6 - 15.2 seconds   INR 2.72 (H) 0.00 - 1.49    Intake/Output Summary (Last 24 hours) at 04/26/15 0814 Last data filed at 04/25/15 1630  Gross per 24 hour  Intake    180 ml  Output      0 ml  Net    180 ml    TELE:  Atrial fib with rate control 100s   ASSESSMENT AND PLAN:  CHEST PAIN:   Atypical.  No objective evidence of ischemia.    Still mild discomfort with deep breathing.  However, no further cardiac studies are planned.    ATRIAL FIB:   Rate was  increased.  Toprol increased.   Continue warfarin.    She has been in persistent fib since July.   PULM HTN:    Pulm has been consulted.      Jeneen Rinks [facility] 04/26/2015 8:14 AM

## 2015-04-26 NOTE — Progress Notes (Signed)
[facility] PROGRESS NOTE  PA COLMENERO [id number] DOB: [birth year] DOA: 04/23/2015 PCP: Lottie Dawson, MD  Assessment/Plan: Active Problems:   Atrial fibrillation ([place]) - anticoagulated on warfarin - Rate controlled on metoprolol    Hypertension - Currently on metoprolol    Chest pain - Patient had negative troponins - Consulted cardiology - echo reviewed - no further cardiac testing recommended  Pulmonary htn/sob - moderate on echo - consulted pulmonology - Will try trial of lasix.    Hypokalemia - Resolved    Hypomagnesemia - Resolved    Rheumatoid arthritis (HCC) -Continue pain control and supportive therapy   Code Status: Full Family Communication: none at bedside Disposition Plan: Pending improvement in condition.   Consultants:  Cardiology  Procedures:  None  Antibiotics:  None  HPI/Subjective: Pt has no new complaints.   Objective: Filed Vitals:   04/26/15 0521 04/26/15 1020  BP: 130/70 112/86  Pulse: 91 82  Temp: 98.6 F (37 C)   Resp: 20     Intake/Output Summary (Last 24 hours) at 04/26/15 1320 Last data filed at 04/26/15 1022  Gross per 24 hour  Intake     63 ml  Output      0 ml  Net     63 ml   Filed Weights   04/24/15 0420  Weight: 69.1 kg (152 lb 5.4 oz)    Exam:   General:  Pt in nad, alert and awake  Cardiovascular: rrr, no rubs  Respiratory: no increased wob, no wheezes  Abdomen: soft, nd, nt  Musculoskeletal: no cyanosis or clubbing   Data Reviewed: Basic Metabolic Panel:  Recent Labs Lab 04/23/15 2205 04/24/15 0006 04/24/15 0445  NA 142  --  142  K 2.8*  --  3.6  CL 104  --  106  CO2 28  --  25  GLUCOSE 156*  --  119*  BUN 14  --  11  CREATININE 0.97  --  0.75  CALCIUM 9.5  --  8.9  MG  --  1.4* 2.4  PHOS  --   --  3.0   Liver Function Tests:  Recent Labs Lab 04/24/15 0445  AST 37  ALT 48  ALKPHOS 59  BILITOT 0.8  PROT 6.2*  ALBUMIN 3.4*   No results  for input(s): LIPASE, AMYLASE in the last 168 hours. No results for input(s): AMMONIA in the last 168 hours. CBC:  Recent Labs Lab 04/23/15 2205 04/24/15 0445  WBC 8.7 10.0  HGB 12.8 11.9*  HCT 38.8 38.2  MCV 94.2 94.8  PLT 214 199   Cardiac Enzymes:  Recent Labs Lab 04/24/15 0445 04/24/15 1118 04/24/15 1631  TROPONINI <0.03 <0.03 <0.03   BNP (last 3 results) No results for input(s): BNP in the last 8760 hours.  ProBNP (last 3 results) No results for input(s): PROBNP in the last 8760 hours.  CBG: No results for input(s): GLUCAP in the last 168 hours.  No results found for this or any previous visit (from the past 240 hour(s)).   Studies: No results found.  Scheduled Meds: . folic acid  2 mg Oral Daily  . metoprolol succinate  25 mg Oral QPM  . metoprolol succinate  50 mg Oral Daily  . pravastatin  20 mg Oral QPM  . predniSONE  2.5 mg Oral Q breakfast  . sodium chloride flush  3 mL Intravenous Q12H  . [START ON 04/29/2015] warfarin  2.5 mg Oral Q Thu-1800  . warfarin  5 mg  Oral Once per day on Sun Mon Tue Wed Fri Sat  . Warfarin - Pharmacist Dosing Inpatient   Does not apply q1800   Continuous Infusions:   Time spent: > 35 minutes  Velvet Bathe  [facility] Pager [phone] If 7PM-7AM, please contact night-coverage at [email or web address], password [facility] 04/26/2015, 1:20 PM

## 2015-04-26 NOTE — Care Management Obs Status (Signed)
[place] NOTIFICATION   Patient Details  Name: ARPIL BLYDENBURGH MRN: [id number] Date of Birth: 03/16/1940   Medicare Observation Status Notification Given:  Yes    Dawayne Patricia, RN 04/26/2015, 11:12 AM

## 2015-04-26 NOTE — Consult Note (Signed)
Name: [name] MRN: [id number] DOB: January 01, 1941    ADMISSION DATE:  04/23/2015 CONSULTATION DATE:  04/25/2015  REFERRING MD :  Hillary Bow  CHIEF COMPLAINT:  Shortness of breath  HISTORY OF PRESENT ILLNESS:  55F admitted 04/23/15 with complaints of palpitations, chest pain, shortness of breath and nausea. Since admission, she has been ruled out for acute ischemia with normal troponins and no acute changes on ECG. Her echo showed a moderately elevated RVSP at 72mmHg. A trivial pericardial effusion was also noted. She has a history of chronic atrial fibrillation on rate control and anticoagulation with warfarin. Other PMH is significant for hypertension and RA (on Enbrel, MTX, prednisone 2.5mg  daily). She is a never smoker.   Today she reports that her symptoms are essentially unchanged since admission. She continues to have dyspnea with exertion accompanied by pleuritic type chest pain that she feels in the front of her chest and between her shoulder blades. She can make it to the bathroom and back, but not much further. She reports that she has been under incredible amounts of stress with a husband who has end-stage heart failure, a neighbor just diagnosed with cancer, the man she cares for through work entering hospice, her sister currently being inpatient and her brother-in-law having just died unexpectedly. She attributes much of her symptomatology to stress. It always seems to aggravate her A fib, which in turn causes some of her other symptoms. She was admitted with similar symptoms in July and responded to antibiotics for CAP and a small increase in her prednisone dose. She did well after discharge and really only began having trouble again last week. She starting having a lot of palpitation. She was sleeping more upright than usual and had pain and increased shortness of breath if she tried to sleep in a more recumbent position. She reports some increased abdominal girth and fullness. Minimal,  but present, lower extremity edema. Overall, she says this is what happens when her A fib is not under control and she is stressed.   No cough / sputum / hemoptysis. No fever / chills / night sweats. She intermittently wheezes. She has a history of "bronchial asthma" and "allergies", which both got much better after removing the carpet from her house. She does not use any inhalers. She has been on Enbrel for ~18 years and MTX for ~20years. 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   PHYSICAL EXAM: Temp:  [97.7 F (36.5 C)-98.7 F (37.1 C)] 98.6 F (37 C) (04/10 0521) Pulse Rate:  [82-99] 82 (04/10 1020) Resp:  [20] 20 (04/10 0521) BP: (112-130)/(55-103) 112/86 mmHg (04/10 1020) SpO2:  [97 %-98 %] 97 % (04/10 0521) Body mass index is 26.99 kg/(m^2).  General Well nourished, well developed, no apparent distress  HEENT No gross abnormalities. Oropharynx clear, no JVD noted  Pulmonary Diminished breath sounds bilaterally.No ronchi or wheezes, crackles . Effort limited by pleuritic pain. Symmetrical expansion. No chest wall tenderness to palpation.   Cardiovascular Irregularly irregular rhythm. S1, s2. No m/r/g. Distal pulses palpable.  Abdomen Soft, non-tender, mildly distended, positive bowel sounds, .   Musculoskeletal + bony changes related to longstanding RA. Otherwise moves well.   Lymphatics No cervical, supraclavicular or axillary adenopathy.   Neurologic Grossly intact. No focal deficits.   Skin/Integuement No rash, no cyanosis, no clubbing. Trace bipedal edema.       Recent Labs Lab 04/23/15 2205 04/24/15 0445  NA 142 142  K 2.8* 3.6  CL 104 106  CO2 28 25  BUN 14 11  CREATININE 0.97 0.75  GLUCOSE 156* 119*    Recent Labs Lab 04/23/15 2205 04/24/15 0445  HGB 12.8 11.9*  HCT 38.8 38.2  WBC 8.7 10.0  PLT 214 199   STUDIES:  TTE 04/24/15 LVEF 55-60% Normal LV size and function Unable to assess diastolic dysfunction RVSP 89mmHg (increased from 37  08/04/14) RV normal size and function No significant valvular abnormalities.   CXR 04/23/15 Nothing acute. Mild interstitial prominence.   CTA Chest 08/03/14 No pulmonary emboli. Scattered ground glass.  Small left effusion.  Bibasilar atelectasis.  ASSESSMENT / PLAN:  shortness of breath and elevated RVSP on TTE; concern for pulmonary hypertension. History significant for HTN, chronic A fib, rheumatoid arthritis on Enbrel/MTX  pleuritic chest pain    P afib management per primary team Encourage ambulation Ambulatory O2 sats prior to discharge    59F w/ shortness of breath and elevated RVSP on TTE; concern for pulmonary hypertension. History significant for HTN, chronic A fib, rheumatoid arthritis on Enbrel/MTX, HLD. Never smoker. CT from July without evidence of underlying parenchymal disease. Her pulmonary hypertension is most likely related to her underlying cardiac disease (A fib), although her autoimmune disease could be contributing. She will need outpatient PFTs with spirometry, lung volumes and DLCO. She may benefit from diuresis given rales, abdominal fullness and trace edema on exam. There may be a component of serositis here, as her pleuritic chest pain seems out of proportion to her imaging and she has a pericardial effusion, as well. She might benefit from a brief course of higher dose prednisone. It is very unlikely that she would have WHO Group I disease (PAH) and therefore is unlikely to benefit from treatment with PDE5 inhibitors, prostacyclins or endothelin receptor antagonists. If she truly has pHTN, it would more likely be Group III or IV. Should her symptoms persist despite maximal medical management of her comorbid conditions, she would need a RHC for definitive diagnosis.     continue Diuresis   Continued titration of A fib management  Consider NSAIDs for pleurisy   Encourage ambulation   Ambulatory O2 sats / oxygen titration prior to discharge  Outpatient  PFTs  Repeat echo once dry weight achieved  Consideration of RHC for definitive dx if symptoms persist  Consider repeat CT imaging if PFTs suggestive of significant restriction or impaired DLCO  If symptoms slow to improve may benefit from cardiopulmonary rehab referral  Outpatient follow up with pulmonary     Theodora Lalanne,AG-ACNP Pulmonary & Critical Care

## 2015-04-26 NOTE — Progress Notes (Signed)
Patient moved from chair to bed. Requested a prn of attivan due to patient's anxiety. Husband is currently on 2 heart, sister had a cardioversion today, brother in law died May 10, 2022 night, cousin had heart attack last night and she just found out her best friend has a week to live. Patient currently calm will continue to monitor her. Call light within reach.

## 2015-04-27 ENCOUNTER — Observation Stay (HOSPITAL_COMMUNITY): Payer: Medicare Other

## 2015-04-27 DIAGNOSIS — I4891 Unspecified atrial fibrillation: Secondary | ICD-10-CM | POA: Diagnosis not present

## 2015-04-27 DIAGNOSIS — R0789 Other chest pain: Secondary | ICD-10-CM | POA: Diagnosis not present

## 2015-04-27 LAB — SEDIMENTATION RATE: SED RATE: 51 mm/h — AB (ref 0–22)

## 2015-04-27 LAB — PROTIME-INR
INR: 2.68 — ABNORMAL HIGH (ref 0.00–1.49)
PROTHROMBIN TIME: 28.1 s — AB (ref 11.6–15.2)

## 2015-04-27 LAB — C-REACTIVE PROTEIN: CRP: 6.5 mg/dL — AB (ref <1.0)

## 2015-04-27 MED ORDER — METOPROLOL SUCCINATE ER 25 MG PO TB24: 25.0000 mg | ORAL_TABLET | Freq: Every evening | ORAL | Status: DC

## 2015-04-27 NOTE — Progress Notes (Signed)
ANTICOAGULATION CONSULT NOTE - Follow Up Consult  Pharmacy Consult for Coumadin Indication: atrial fibrillation  Allergies  Allergen Reactions  . Arthrotec [Diclofenac-Misoprostol] Diarrhea  . Erythromycin     Anaphylaxis   . Morphine Sulfate Other (See Comments)    Erratic behavior; hallucinations  . Gold-Containing Drug Products Rash  . Macrodantin [Nitrofurantoin Macrocrystal] Rash  . Naproxen Rash  . Penicillins Rash    amoxicillin    Patient Measurements: Height: 5\' 3"  (160 cm) Weight: 152 lb 5.4 oz (69.1 kg) IBW/kg (Calculated) : 52.4  Vital Signs: Temp: 98.1 F (36.7 C) (04/11 0458) Temp Source: Oral (04/11 0458) BP: 111/78 mmHg (04/11 0458) Pulse Rate: 95 (04/11 0458)  Labs:  Recent Labs  04/24/15 1631 04/25/15 0321 04/26/15 0254 04/27/15 0245  LABPROT  --  25.6* 28.4* 28.1*  INR  --  2.37* 2.72* 2.68*  TROPONINI <0.03  --   --   --     Estimated Creatinine Clearance: 58.4 mL/min (by C-G formula based on Cr of 0.75).  Assessment: 74yof continues on coumadin for afib. INR therapeutic at 2.6 on her home dose of 5mg  daily except 2.5mg  on Thursday.   Goal of Therapy:  INR 2-3 Monitor platelets by anticoagulation protocol: Yes   Plan:  1) Recommend d/c on home dose   Erin Hearing PharmD., BCPS Clinical Pharmacist Pager [phone] 04/27/2015 11:46 AM

## 2015-04-27 NOTE — Discharge Summary (Signed)
Physician Discharge Summary  CYDNI CAPELLO [id number] DOB: 1940/04/15 DOA: 04/23/2015  PCP: Lottie Dawson, MD  Admit date: 04/23/2015 Discharge date: 04/27/2015  Time spent: > 35 minutes  Recommendations for Outpatient Follow-up:  1. Patient will need PFT's and further evaluation by pulmonology as outpatient 2. Also will need f/u with cardiologist   Discharge Diagnoses:  Active Problems:   Atrial fibrillation (HCC)   Hypertension   Chest pain   Hypokalemia   Hypomagnesemia   QT prolongation   Rheumatoid arthritis [facility])   Discharge Condition: stable  Diet recommendation: heart healthy  Filed Weights   04/24/15 0420  Weight: 69.1 kg (152 lb 5.4 oz)    History of present illness:  From original HPI: 75 y.o. [name]   has a past medical history of Esophageal reflux; Rheumatoid arthritis ([place]) (1975); Osteopenia; Polymorphic light eruption (2004); Hyperlipidemia; Adhesive capsulitis of left shoulder (1980); Right middle ear infection (1986); Acne rosacea; Allergic rhinitis; Hypercholesterolemia; Atrial fibrillation ([place]); Hypertension; Edema; and Prolonged QT interval.   Presented with palpitations and chest pain radiating under left breast going to left arm and into the back associated shortness of breath and nausea  Hospital Course:   Active Problems:  Atrial fibrillation (HCC) - anticoagulated on warfarin - Rate controlled on metoprolol will continue on discharge   Hypertension - Currently on metoprolol   Chest pain - Patient had negative troponins - Consulted cardiology - echo reviewed - no further cardiac testing recommended by cardiology  Pulmonary htn/sob - moderate on echo - consulted pulmonology who recommends further outpatient evaluation. Reportedly they are suspecting that this is most likely secondary to her cardiac problems   Hypokalemia - Resolved   Hypomagnesemia - Resolved   Rheumatoid arthritis ([place]) -Continue  pain control and supportive therapy as well as home medication regimen   Procedures:  None  Consultations:  None  Discharge Exam: Filed Vitals:   04/27/15 0254 04/27/15 0458  BP: 104/61 111/78  Pulse: 98 95  Temp: 98.2 F (36.8 C) 98.1 F (36.7 C)  Resp: 18 16    General: Pt in nad, alert and awake Cardiovascular: rrr, no rubs Respiratory: no increased wob, no wheezes  Discharge Instructions   Discharge Instructions    Call MD for:  difficulty breathing, headache or visual disturbances    Complete by:  As directed      Call MD for:  temperature >100.4    Complete by:  As directed      Diet - low sodium heart healthy    Complete by:  As directed      Discharge instructions    Complete by:  As directed   Please be sure to follow up with your pulmonologist as outpatient for further evaluation and recommendations.     Increase activity slowly    Complete by:  As directed           Current Discharge Medication List    START taking these medications   Details  !! metoprolol succinate (TOPROL-XL) 25 MG 24 hr tablet Take 1 tablet (25 mg total) by mouth every evening. Qty: 30 tablet, Refills: 0     !! - Potential duplicate medications found. Please discuss with provider.    CONTINUE these medications which have NOT CHANGED   Details  acidophilus (RISAQUAD) CAPS capsule Take 1 capsule by mouth daily.    cholecalciferol (VITAMIN D) 1000 UNITS tablet Take 2,000 Units by mouth daily.    Etanercept (ENBREL [place]) Take one (1) injection subcutaneous  every Friday.    fluticasone (FLONASE) 50 MCG/ACT nasal spray Place 2 sprays into both nostrils daily as needed for allergies or rhinitis.     folic acid (FOLVITE) 1 MG tablet Take 2 mg by mouth daily.     furosemide (LASIX) 40 MG tablet Take 20 mg by mouth daily.     hydroxypropyl methylcellulose / hypromellose (ISOPTO TEARS / GONIOVISC) 2.5 % ophthalmic solution Place 1 drop into both eyes 4 (four) times daily as needed  for dry eyes.    methotrexate (RHEUMATREX) 2.5 MG tablet Take three (3) tablets (7.5 mg total) by mouth every Tuesday; Take two (2) tablets (5 mg total) by mouth every Wednesday. Refills: 1    !! metoprolol succinate (TOPROL-XL) 50 MG 24 hr tablet Take 1 tablet (50 mg total) by mouth daily. Take with or immediately following a meal. Qty: 90 tablet, Refills: 0    Multiple Vitamins-Minerals (MULTIVITAMIN WITH MINERALS) tablet Take 1 tablet by mouth daily.    omeprazole (PRILOSEC) 20 MG capsule Take 20 mg by mouth every morning.     pravastatin (PRAVACHOL) 20 MG tablet TAKE 1 TABLET EVERY EVENING Qty: 30 tablet, Refills: 11    predniSONE (DELTASONE) 5 MG tablet Take 2.5 mg by mouth daily with breakfast.     traMADol (ULTRAM) 50 MG tablet Take 1 tablet (50 mg total) by mouth every 6 (six) hours as needed for severe pain. Qty: 30 tablet, Refills: 0    !! warfarin (COUMADIN) 5 MG tablet Take 2.5-5 mg by mouth daily. Take 2.5mg  on Thursday. Take 5mg  rest of days    !! warfarin (COUMADIN) 5 MG tablet TAKE 1 TABLET BY MOUTH DAILY OR AS DIRECTED Qty: 30 tablet, Refills: 3     !! - Potential duplicate medications found. Please discuss with provider.     Allergies  Allergen Reactions  . Arthrotec [Diclofenac-Misoprostol] Diarrhea  . Erythromycin     Anaphylaxis   . Morphine Sulfate Other (See Comments)    Erratic behavior; hallucinations  . Gold-Containing Drug Products Rash  . Macrodantin [Nitrofurantoin Macrocrystal] Rash  . Naproxen Rash  . Penicillins Rash    amoxicillin      The results of significant diagnostics from this hospitalization (including imaging, microbiology, ancillary and laboratory) are listed below for reference.    Significant Diagnostic Studies: Dg Chest 2 View  04/27/2015  CLINICAL DATA:  Acute respiratory failure. EXAM: CHEST  2 VIEW COMPARISON:  04/23/2015 FINDINGS: Cardiomegaly. Left lower lobe atelectasis or infiltrate, increased since prior study. No  confluent opacity on the right. Trace bilateral effusions. No acute bony abnormality. IMPRESSION: Increasing left lower lobe atelectasis or infiltrate. Cardiomegaly. Trace bilateral effusions. Electronically Signed   By: Rolm Baptise M.D.   On: 04/27/2015 07:23   Dg Chest 2 View  04/23/2015  CLINICAL DATA:  Chest pain, weakness, shortness of breath and dizziness. Symptoms for 1 day. EXAM: CHEST  2 VIEW COMPARISON:  08/04/2014 FINDINGS: Lung volumes remain low. Stable cardiomegaly. There is mild interstitial thickening concerning for edema. Atelectasis or scarring in the right lower lobe. No large pleural effusion. No pneumothorax. IMPRESSION: Cardiomegaly with mild interstitial edema. Electronically Signed   By: Jeb Levering M.D.   On: 04/23/2015 22:25    Microbiology: No results found for this or any previous visit (from the past 240 hour(s)).   Labs: Basic Metabolic Panel:  Recent Labs Lab 04/23/15 2205 04/24/15 0006 04/24/15 0445  NA 142  --  142  K 2.8*  --  3.6  CL 104  --  106  CO2 28  --  25  GLUCOSE 156*  --  119*  BUN 14  --  11  CREATININE 0.97  --  0.Nicole  CALCIUM 9.5  --  8.9  MG  --  1.4* 2.4  PHOS  --   --  3.0   Liver Function Tests:  Recent Labs Lab 04/24/15 0445  AST 37  ALT 48  ALKPHOS 59  BILITOT 0.8  PROT 6.2*  ALBUMIN 3.4*   No results for input(s): LIPASE, AMYLASE in the last 168 hours. No results for input(s): AMMONIA in the last 168 hours. CBC:  Recent Labs Lab 04/23/15 2205 04/24/15 0445  WBC 8.7 10.0  HGB 12.8 11.9*  HCT 38.8 38.2  MCV 94.2 94.8  PLT 214 199   Cardiac Enzymes:  Recent Labs Lab 04/24/15 0445 04/24/15 1118 04/24/15 1631  TROPONINI <0.03 <0.03 <0.03   BNP: BNP (last 3 results) No results for input(s): BNP in the last 8760 hours.  ProBNP (last 3 results) No results for input(s): PROBNP in the last 8760 hours.  CBG: No results for input(s): GLUCAP in the last 168 hours.     Signed:  Velvet Bathe MD.   [facility] 04/27/2015, 10:48 AM

## 2015-05-10 ENCOUNTER — Other Ambulatory Visit (HOSPITAL_COMMUNITY): Payer: Self-pay | Admitting: Respiratory Therapy

## 2015-05-10 DIAGNOSIS — R0600 Dyspnea, unspecified: Secondary | ICD-10-CM

## 2015-05-12 ENCOUNTER — Ambulatory Visit (HOSPITAL_COMMUNITY)
Admission: RE | Admit: 2015-05-12 | Discharge: 2015-05-12 | Disposition: A | Discharge status: Home / Self Care | Payer: Medicare Other | Source: Ambulatory Visit | Attending: Internal Medicine | Admitting: Internal Medicine

## 2015-05-12 DIAGNOSIS — R0600 Dyspnea, unspecified: Secondary | ICD-10-CM | POA: Diagnosis not present

## 2015-05-13 LAB — PULMONARY FUNCTION TEST
DL/VA % PRED: 107 %
DL/VA: 5.03 ml/min/mmHg/L
DLCO unc % pred: 63 %
DLCO unc: 14.49 ml/min/mmHg
FEF 25-75 PRE: 1.43 L/s
FEF2575-%PRED-PRE: 85 %
FEV1-%PRED-PRE: 77 %
FEV1-PRE: 1.58 L
FEV1FVC-%Pred-Pre: 104 %
FEV6-%Pred-Pre: 77 %
FEV6-PRE: 2.02 L
FEV6FVC-%Pred-Pre: 105 %
FVC-%PRED-PRE: 73 %
FVC-PRE: 2.02 L
Pre FEV1/FVC ratio: 78 %
Pre FEV6/FVC Ratio: 100 %
RV % pred: 110 %
RV: 2.43 L
TLC % pred: 95 %
TLC: 4.7 L

## 2015-05-17 ENCOUNTER — Other Ambulatory Visit: Payer: Self-pay | Admitting: Interventional Cardiology

## 2015-05-18 ENCOUNTER — Other Ambulatory Visit: Payer: Self-pay

## 2015-05-18 ENCOUNTER — Ambulatory Visit (INDEPENDENT_AMBULATORY_CARE_PROVIDER_SITE_OTHER): Payer: Medicare Other | Admitting: Pharmacist

## 2015-05-18 DIAGNOSIS — Z5181 Encounter for therapeutic drug level monitoring: Secondary | ICD-10-CM

## 2015-05-18 DIAGNOSIS — I4891 Unspecified atrial fibrillation: Secondary | ICD-10-CM

## 2015-05-18 DIAGNOSIS — Z7901 Long term (current) use of anticoagulants: Secondary | ICD-10-CM

## 2015-05-18 LAB — POCT INR: INR: 2.1

## 2015-05-18 MED ORDER — METOPROLOL SUCCINATE ER 25 MG PO TB24: 25.0000 mg | ORAL_TABLET | Freq: Every evening | ORAL | Status: DC

## 2015-05-18 MED ORDER — METOPROLOL SUCCINATE ER 50 MG PO TB24: 50.0000 mg | ORAL_TABLET | Freq: Every day | ORAL | Status: DC

## 2015-05-23 ENCOUNTER — Encounter (HOSPITAL_COMMUNITY): Payer: Self-pay | Admitting: Emergency Medicine

## 2015-05-23 ENCOUNTER — Inpatient Hospital Stay (HOSPITAL_COMMUNITY): Payer: Medicare Other

## 2015-05-23 ENCOUNTER — Emergency Department (HOSPITAL_COMMUNITY): Payer: Medicare Other

## 2015-05-23 ENCOUNTER — Inpatient Hospital Stay (HOSPITAL_COMMUNITY)
Admission: EM | Admit: 2015-05-23 | Discharge: 2015-06-01 | DRG: 186 | Disposition: A | Discharge status: Home / Self Care | Payer: Medicare Other | Attending: Internal Medicine | Admitting: Internal Medicine

## 2015-05-23 DIAGNOSIS — Z886 Allergy status to analgesic agent status: Secondary | ICD-10-CM

## 2015-05-23 DIAGNOSIS — J869 Pyothorax without fistula: Secondary | ICD-10-CM | POA: Diagnosis present

## 2015-05-23 DIAGNOSIS — I10 Essential (primary) hypertension: Secondary | ICD-10-CM | POA: Diagnosis present

## 2015-05-23 DIAGNOSIS — Z885 Allergy status to narcotic agent status: Secondary | ICD-10-CM | POA: Diagnosis not present

## 2015-05-23 DIAGNOSIS — Z9689 Presence of other specified functional implants: Secondary | ICD-10-CM | POA: Insufficient documentation

## 2015-05-23 DIAGNOSIS — E7800 Pure hypercholesterolemia, unspecified: Secondary | ICD-10-CM | POA: Diagnosis present

## 2015-05-23 DIAGNOSIS — G9340 Encephalopathy, unspecified: Secondary | ICD-10-CM | POA: Diagnosis present

## 2015-05-23 DIAGNOSIS — Z7901 Long term (current) use of anticoagulants: Secondary | ICD-10-CM | POA: Diagnosis not present

## 2015-05-23 DIAGNOSIS — R000 Tachycardia, unspecified: Secondary | ICD-10-CM | POA: Diagnosis present

## 2015-05-23 DIAGNOSIS — J90 Pleural effusion, not elsewhere classified: Secondary | ICD-10-CM | POA: Diagnosis present

## 2015-05-23 DIAGNOSIS — J189 Pneumonia, unspecified organism: Secondary | ICD-10-CM | POA: Diagnosis present

## 2015-05-23 DIAGNOSIS — M069 Rheumatoid arthritis, unspecified: Secondary | ICD-10-CM | POA: Diagnosis present

## 2015-05-23 DIAGNOSIS — E785 Hyperlipidemia, unspecified: Secondary | ICD-10-CM | POA: Diagnosis present

## 2015-05-23 DIAGNOSIS — Z881 Allergy status to other antibiotic agents status: Secondary | ICD-10-CM | POA: Diagnosis not present

## 2015-05-23 DIAGNOSIS — Z79899 Other long term (current) drug therapy: Secondary | ICD-10-CM

## 2015-05-23 DIAGNOSIS — I481 Persistent atrial fibrillation: Secondary | ICD-10-CM | POA: Diagnosis not present

## 2015-05-23 DIAGNOSIS — Z8249 Family history of ischemic heart disease and other diseases of the circulatory system: Secondary | ICD-10-CM

## 2015-05-23 DIAGNOSIS — J9600 Acute respiratory failure, unspecified whether with hypoxia or hypercapnia: Secondary | ICD-10-CM | POA: Diagnosis present

## 2015-05-23 DIAGNOSIS — R0781 Pleurodynia: Secondary | ICD-10-CM | POA: Diagnosis not present

## 2015-05-23 DIAGNOSIS — R0600 Dyspnea, unspecified: Secondary | ICD-10-CM | POA: Diagnosis not present

## 2015-05-23 DIAGNOSIS — J9811 Atelectasis: Secondary | ICD-10-CM | POA: Diagnosis present

## 2015-05-23 DIAGNOSIS — Y95 Nosocomial condition: Secondary | ICD-10-CM | POA: Diagnosis present

## 2015-05-23 DIAGNOSIS — Z789 Other specified health status: Secondary | ICD-10-CM | POA: Diagnosis not present

## 2015-05-23 DIAGNOSIS — I482 Chronic atrial fibrillation, unspecified: Secondary | ICD-10-CM | POA: Insufficient documentation

## 2015-05-23 DIAGNOSIS — L719 Rosacea, unspecified: Secondary | ICD-10-CM | POA: Diagnosis present

## 2015-05-23 DIAGNOSIS — J948 Other specified pleural conditions: Secondary | ICD-10-CM | POA: Diagnosis not present

## 2015-05-23 DIAGNOSIS — J9601 Acute respiratory failure with hypoxia: Secondary | ICD-10-CM | POA: Diagnosis present

## 2015-05-23 DIAGNOSIS — R0602 Shortness of breath: Secondary | ICD-10-CM | POA: Diagnosis present

## 2015-05-23 DIAGNOSIS — K219 Gastro-esophageal reflux disease without esophagitis: Secondary | ICD-10-CM | POA: Diagnosis present

## 2015-05-23 DIAGNOSIS — M8580 Other specified disorders of bone density and structure, unspecified site: Secondary | ICD-10-CM | POA: Diagnosis present

## 2015-05-23 DIAGNOSIS — M0579 Rheumatoid arthritis with rheumatoid factor of multiple sites without organ or systems involvement: Secondary | ICD-10-CM | POA: Diagnosis not present

## 2015-05-23 DIAGNOSIS — I4581 Long QT syndrome: Secondary | ICD-10-CM | POA: Diagnosis not present

## 2015-05-23 DIAGNOSIS — I272 Other secondary pulmonary hypertension: Secondary | ICD-10-CM | POA: Diagnosis present

## 2015-05-23 DIAGNOSIS — Z880 Allergy status to penicillin: Secondary | ICD-10-CM | POA: Diagnosis not present

## 2015-05-23 DIAGNOSIS — Z9889 Other specified postprocedural states: Secondary | ICD-10-CM

## 2015-05-23 LAB — I-STAT TROPONIN, ED: Troponin i, poc: 0 ng/mL (ref 0.00–0.08)

## 2015-05-23 LAB — COMPREHENSIVE METABOLIC PANEL
ALT: 20 U/L (ref 14–54)
AST: 21 U/L (ref 15–41)
Albumin: 3.7 g/dL (ref 3.5–5.0)
Alkaline Phosphatase: 65 U/L (ref 38–126)
Anion gap: 10 (ref 5–15)
BUN: 11 mg/dL (ref 6–20)
CO2: 26 mmol/L (ref 22–32)
Calcium: 8.9 mg/dL (ref 8.9–10.3)
Chloride: 100 mmol/L — ABNORMAL LOW (ref 101–111)
Creatinine, Ser: 0.73 mg/dL (ref 0.44–1.00)
GFR calc Af Amer: >60 mL/min (ref >60)
GFR calc non Af Amer: >60 mL/min (ref >60)
Glucose, Bld: 108 mg/dL — ABNORMAL HIGH (ref 65–99)
Potassium: 3.5 mmol/L (ref 3.5–5.1)
Sodium: 136 mmol/L (ref 135–145)
Total Bilirubin: 0.8 mg/dL (ref 0.3–1.2)
Total Protein: 6.6 g/dL (ref 6.5–8.1)

## 2015-05-23 LAB — CBC WITH DIFFERENTIAL/PLATELET
Basophils Absolute: 0 10*3/uL (ref 0.0–0.1)
Basophils Relative: 0 %
Eosinophils Absolute: 0.1 10*3/uL (ref 0.0–0.7)
Eosinophils Relative: 1 %
HCT: 41.5 % (ref 36.0–46.0)
Hemoglobin: 13.5 g/dL (ref 12.0–15.0)
Lymphocytes Relative: 19 %
Lymphs Abs: 1.5 10*3/uL (ref 0.7–4.0)
MCH: 30.1 pg (ref 26.0–34.0)
MCHC: 32.5 g/dL (ref 30.0–36.0)
MCV: 92.4 fL (ref 78.0–100.0)
Monocytes Absolute: 0.9 10*3/uL (ref 0.1–1.0)
Monocytes Relative: 12 %
Neutro Abs: 5.1 10*3/uL (ref 1.7–7.7)
Neutrophils Relative %: 68 %
Platelets: 237 10*3/uL (ref 150–400)
RBC: 4.49 MIL/uL (ref 3.87–5.11)
RDW: 15.6 % — ABNORMAL HIGH (ref 11.5–15.5)
WBC: 7.6 10*3/uL (ref 4.0–10.5)

## 2015-05-23 LAB — PROTIME-INR
INR: 1.43 (ref 0.00–1.49)
Prothrombin Time: 17 seconds — ABNORMAL HIGH (ref 11.6–15.2)

## 2015-05-23 LAB — RAPID URINE DRUG SCREEN, HOSP PERFORMED
Amphetamines: NOT DETECTED
Barbiturates: NOT DETECTED
Benzodiazepines: NOT DETECTED
Cocaine: NOT DETECTED
Opiates: NOT DETECTED
Tetrahydrocannabinol: NOT DETECTED

## 2015-05-23 LAB — URINALYSIS, ROUTINE W REFLEX MICROSCOPIC
Bilirubin Urine: NEGATIVE
Glucose, UA: NEGATIVE mg/dL
Ketones, ur: NEGATIVE mg/dL
Leukocytes, UA: NEGATIVE
Nitrite: NEGATIVE
Protein, ur: 30 mg/dL — AB
Specific Gravity, Urine: 1.021 (ref 1.005–1.030)
pH: 6.5 (ref 5.0–8.0)

## 2015-05-23 LAB — URINE MICROSCOPIC-ADD ON

## 2015-05-23 LAB — I-STAT CG4 LACTIC ACID, ED
Lactic Acid, Venous: 1.29 mmol/L (ref 0.5–2.0)
Lactic Acid, Venous: 0.9 mmol/L (ref 0.5–2.0)

## 2015-05-23 LAB — CBG MONITORING, ED: Glucose-Capillary: 102 mg/dL — ABNORMAL HIGH (ref 65–99)

## 2015-05-23 MED ORDER — METOPROLOL SUCCINATE ER 25 MG PO TB24
25.0000 mg | ORAL_TABLET | Freq: Every evening | ORAL | Status: DC
Start: 2015-05-24 — End: 2015-05-28
  Administered 2015-05-24 – 2015-05-27 (×4): 25 mg via ORAL
  Filled 2015-05-23 (×5): qty 1

## 2015-05-23 MED ORDER — METOPROLOL SUCCINATE ER 50 MG PO TB24
50.0000 mg | ORAL_TABLET | Freq: Every day | ORAL | Status: DC
  Administered 2015-05-24 – 2015-06-01 (×9): 50 mg via ORAL
  Filled 2015-05-23 (×9): qty 1

## 2015-05-23 MED ORDER — VANCOMYCIN HCL IN DEXTROSE 1-5 GM/200ML-% IV SOLN
1000.0000 mg | Freq: Once | INTRAVENOUS | Status: AC
  Administered 2015-05-23: 1000 mg via INTRAVENOUS
  Filled 2015-05-23: qty 200

## 2015-05-23 MED ORDER — FOLIC ACID 1 MG PO TABS
2.0000 mg | ORAL_TABLET | Freq: Every day | ORAL | Status: DC
  Administered 2015-05-24 – 2015-06-01 (×9): 2 mg via ORAL
  Filled 2015-05-23 (×9): qty 2

## 2015-05-23 MED ORDER — FLUTICASONE PROPIONATE 50 MCG/ACT NA SUSP
2.0000 | Freq: Every day | NASAL | Status: DC | PRN
  Filled 2015-05-23: qty 16

## 2015-05-23 MED ORDER — VANCOMYCIN HCL 500 MG IV SOLR
500.0000 mg | Freq: Two times a day (BID) | INTRAVENOUS | Status: DC
  Administered 2015-05-24 – 2015-05-27 (×7): 500 mg via INTRAVENOUS
  Filled 2015-05-23 (×7): qty 500

## 2015-05-23 MED ORDER — AZTREONAM 2 G IJ SOLR
2.0000 g | Freq: Once | INTRAMUSCULAR | Status: AC
  Administered 2015-05-23: 2 g via INTRAVENOUS
  Filled 2015-05-23: qty 2

## 2015-05-23 MED ORDER — ADULT MULTIVITAMIN W/MINERALS CH
1.0000 | ORAL_TABLET | Freq: Every day | ORAL | Status: DC
  Administered 2015-05-24 – 2015-06-01 (×9): 1 via ORAL
  Filled 2015-05-23 (×9): qty 1

## 2015-05-23 MED ORDER — PRAVASTATIN SODIUM 20 MG PO TABS
20.0000 mg | ORAL_TABLET | Freq: Every evening | ORAL | Status: DC
  Administered 2015-05-24 – 2015-05-31 (×8): 20 mg via ORAL
  Filled 2015-05-23 (×9): qty 1

## 2015-05-23 MED ORDER — PANTOPRAZOLE SODIUM 40 MG PO TBEC
40.0000 mg | DELAYED_RELEASE_TABLET | Freq: Every day | ORAL | Status: DC
  Administered 2015-05-24 – 2015-06-01 (×9): 40 mg via ORAL
  Filled 2015-05-23 (×10): qty 1

## 2015-05-23 MED ORDER — HYDROCORTISONE NA SUCCINATE PF 100 MG IJ SOLR
50.0000 mg | Freq: Two times a day (BID) | INTRAMUSCULAR | Status: DC
  Administered 2015-05-24 – 2015-05-29 (×12): 50 mg via INTRAVENOUS
  Filled 2015-05-23 (×13): qty 1

## 2015-05-23 MED ORDER — SODIUM CHLORIDE 0.9 % IV SOLN
INTRAVENOUS | Status: DC
  Administered 2015-05-24: via INTRAVENOUS

## 2015-05-23 MED ORDER — VITAMIN D3 25 MCG (1000 UNIT) PO TABS
2000.0000 [IU] | ORAL_TABLET | Freq: Every day | ORAL | Status: DC
  Administered 2015-05-24 – 2015-06-01 (×9): 2000 [IU] via ORAL
  Filled 2015-05-23 (×9): qty 2

## 2015-05-23 MED ORDER — POLYVINYL ALCOHOL 1.4 % OP SOLN
1.0000 [drp] | Freq: Four times a day (QID) | OPHTHALMIC | Status: DC | PRN
  Filled 2015-05-23: qty 15

## 2015-05-23 MED ORDER — AZTREONAM 2 G IJ SOLR
2.0000 g | Freq: Three times a day (TID) | INTRAMUSCULAR | Status: DC
  Administered 2015-05-24: 2 g via INTRAVENOUS
  Filled 2015-05-23 (×2): qty 2

## 2015-05-23 NOTE — H&P (Signed)
History and Physical    [name] [id number] DOB: 24-Jul-1940 DOA: 05/23/2015  Referring MD/NP/PA: Dr.Kohut. PCP: Shamleffer, Herschell Dimes, MD  Outpatient Specialists: [facility]. Patient coming from: Home.  Chief Complaint: Shortness of breath and confusion.  HPI: [name] is a 75 y.o. female with medical history significant of chronic atrial fibrillation, hypertension, rheumatoid arthritis, hyperlipidemia was brought to the ER after patient's daughter found that patient was increasingly confused over the last 2 days with hallucination. Patient also been getting increasingly short of breath left-sided pleuritic chest pain. CT head was unremarkable. On exam patient was nonfocal. Patient was mildly febrile. CT chest shows moderate pleural effusion. Patient has chest pain on deep inspiration and also has some pain on the left upper follow-up abdomen.  ED Course: Blood cultures were obtained and started on antibiotics.  Review of Systems: As per HPI otherwise 10 point review of systems negative.    Past Medical History  Diagnosis Date  . Esophageal reflux   . Rheumatoid arthritis ([place]) 1975  . Osteopenia   . Polymorphic light eruption 2004  . Hyperlipidemia   . Adhesive capsulitis of left shoulder 1980  . Right middle ear infection 1986  . Acne rosacea   . Allergic rhinitis   . Hypercholesterolemia   . Atrial fibrillation ([place])   . Hypertension   . Edema   . Prolonged QT interval     Past Surgical History  Procedure Laterality Date  . Cardiac catheterization    . Cardioversion    . Temporomandibular joint surgery    . Abdominal hysterectomy       reports that she has never smoked. She has never used smokeless tobacco. She reports that she does not drink alcohol or use illicit drugs.  Allergies  Allergen Reactions  . Arthrotec [Diclofenac-Misoprostol] Diarrhea  . Erythromycin     Anaphylaxis   . Morphine Sulfate Other (See Comments)   Erratic behavior; hallucinations  . Gold-Containing Drug Products Rash  . Macrodantin [Nitrofurantoin Macrocrystal] Rash  . Naproxen Rash  . Penicillins Rash    amoxicillin    Family History  Problem Relation Age of Onset  . Heart disease Father   . Heart disease Mother   . Other Mother     prolonged qtc  . Heart disease Sister   . Heart disease Sister   . Healthy Sister     Prior to Admission medications   Medication Sig Start Date End Date Taking? Authorizing Provider  acidophilus (RISAQUAD) CAPS capsule Take 1 capsule by mouth daily.   Yes Historical Provider, MD  cholecalciferol (VITAMIN D) 1000 UNITS tablet Take 2,000 Units by mouth daily.   Yes Historical Provider, MD  Etanercept (ENBREL [place]) Take one (1) injection subcutaneous every Friday.   Yes Historical Provider, MD  fluticasone (FLONASE) 50 MCG/ACT nasal spray Place 2 sprays into both nostrils daily as needed for allergies or rhinitis.    Yes Historical Provider, MD  folic acid (FOLVITE) 1 MG tablet Take 2 mg by mouth daily.    Yes Historical Provider, MD  furosemide (LASIX) 40 MG tablet Take 20 mg by mouth daily.    Yes Historical Provider, MD  hydroxypropyl methylcellulose / hypromellose (ISOPTO TEARS / GONIOVISC) 2.5 % ophthalmic solution Place 1 drop into both eyes 4 (four) times daily as needed for dry eyes.   Yes Historical Provider, MD  methotrexate (RHEUMATREX) 2.5 MG tablet Take three (3) tablets (7.5 mg total) by mouth every Tuesday; Take two (2) tablets (  5 mg total) by mouth every Wednesday. 08/29/14  Yes Historical Provider, MD  metoprolol succinate (TOPROL-XL) 25 MG 24 hr tablet Take 1 tablet (25 mg total) by mouth every evening. 05/18/15  Yes Jettie Booze, MD  metoprolol succinate (TOPROL-XL) 50 MG 24 hr tablet Take 1 tablet (50 mg total) by mouth daily. Take with or immediately following a meal. 05/18/15  Yes Jettie Booze, MD  Multiple Vitamins-Minerals (MULTIVITAMIN WITH MINERALS) tablet Take 1  tablet by mouth daily.   Yes Historical Provider, MD  omeprazole (PRILOSEC) 20 MG capsule Take 20 mg by mouth every morning.    Yes Historical Provider, MD  pravastatin (PRAVACHOL) 20 MG tablet TAKE 1 TABLET EVERY EVENING 06/11/14  Yes Jettie Booze, MD  predniSONE (DELTASONE) 5 MG tablet Take 2.5 mg by mouth daily with breakfast.    Yes Historical Provider, MD  traMADol (ULTRAM) 50 MG tablet Take 1 tablet (50 mg total) by mouth every 6 (six) hours as needed for severe pain. 08/06/14  Yes Velvet Bathe, MD  warfarin (COUMADIN) 5 MG tablet TAKE 1 TABLET BY MOUTH DAILY OR AS DIRECTED 03/08/15  Yes Jettie Booze, MD    Physical Exam: Filed Vitals:   05/23/15 2213 05/23/15 2230 05/23/15 2230 05/23/15 2256  BP: 101/55 120/71  114/76  Pulse: 97 122  85  Temp: 99.2 F (37.3 C)  99.2 F (37.3 C) 98 F (36.7 C)  TempSrc: Oral   Axillary  Resp: 21 30  23   Height:    5\' 3"  (1.6 m)  Weight:    146 lb (66.225 kg)  SpO2: 95% 97%  99%      Constitutional - Appears normal. Filed Vitals:   05/23/15 2213 05/23/15 2230 05/23/15 2230 05/23/15 2256  BP: 101/55 120/71  114/76  Pulse: 97 122  85  Temp: 99.2 F (37.3 C)  99.2 F (37.3 C) 98 F (36.7 C)  TempSrc: Oral   Axillary  Resp: 21 30  23   Height:    5\' 3"  (1.6 m)  Weight:    146 lb (66.225 kg)  SpO2: 95% 97%  99%   Eyes: Anicteric no pallor. ENMT: No discharge from the ears eyes nose or more. Neck: No mass felt. No neck rigidity. Respiratory: No rhonchi or crepitations. Cardiovascular: S1-S2 heard. Abdomen: Soft nontender bowel sounds present. No guarding or rigidity. Musculoskeletal: No edema. Skin: No rash. Neurologic: Alert awake oriented to time place and person. Moves all extremities. Psychiatric: Presently appears normal.   Labs on Admission: I have personally reviewed following labs and imaging studies  CBC:  Recent Labs Lab 05/23/15 1903  WBC 7.6  NEUTROABS 5.1  HGB 13.5  HCT 41.5  MCV 92.4  PLT 123XX123    Basic Metabolic Panel:  Recent Labs Lab 05/23/15 1903  NA 136  K 3.5  CL 100*  CO2 26  GLUCOSE 108*  BUN 11  CREATININE 0.73  CALCIUM 8.9   GFR: Estimated Creatinine Clearance: 57.2 mL/min (by C-G formula based on Cr of 0.73). Liver Function Tests:  Recent Labs Lab 05/23/15 1903  AST 21  ALT 20  ALKPHOS 65  BILITOT 0.8  PROT 6.6  ALBUMIN 3.7   No results for input(s): LIPASE, AMYLASE in the last 168 hours. No results for input(s): AMMONIA in the last 168 hours. Coagulation Profile:  Recent Labs Lab 05/18/15 0955 05/23/15 1903  INR 2.1 1.43   Cardiac Enzymes: No results for input(s): CKTOTAL, CKMB, CKMBINDEX, TROPONINI in the last 168  hours. BNP (last 3 results) No results for input(s): PROBNP in the last 8760 hours. HbA1C: No results for input(s): HGBA1C in the last 72 hours. CBG:  Recent Labs Lab 05/23/15 1903  GLUCAP 102*   Lipid Profile: No results for input(s): CHOL, HDL, LDLCALC, TRIG, CHOLHDL, LDLDIRECT in the last 72 hours. Thyroid Function Tests: No results for input(s): TSH, T4TOTAL, FREET4, T3FREE, THYROIDAB in the last 72 hours. Anemia Panel: No results for input(s): VITAMINB12, FOLATE, FERRITIN, TIBC, IRON, RETICCTPCT in the last 72 hours. Urine analysis:    Component Value Date/Time   COLORURINE AMBER* 05/23/2015 1941   APPEARANCEUR CLEAR 05/23/2015 1941   LABSPEC 1.021 05/23/2015 1941   PHURINE 6.5 05/23/2015 1941   GLUCOSEU NEGATIVE 05/23/2015 1941   HGBUR MODERATE* 05/23/2015 1941   BILIRUBINUR NEGATIVE 05/23/2015 1941   KETONESUR NEGATIVE 05/23/2015 1941   PROTEINUR 30* 05/23/2015 1941   UROBILINOGEN 0.2 10/29/2013 1932   NITRITE NEGATIVE 05/23/2015 1941   LEUKOCYTESUR NEGATIVE 05/23/2015 1941   Sepsis Labs: @LABRCNTIP (procalcitonin:4,lacticidven:4) )No results found for this or any previous visit (from the past 240 hour(s)).   Radiological Exams on Admission: Dg Chest 2 View  05/23/2015  CLINICAL DATA:  Cough and  fever EXAM: CHEST  2 VIEW COMPARISON:  April 27, 2015 FINDINGS: No pneumothorax. Stable cardiomegaly. Increasing effusion and opacity in the left base. Mild pulmonary venous congestion. No other acute abnormalities. IMPRESSION: Increasing effusion and opacity in the left base. Recommend follow-up to resolution. Mild pulmonary venous congestion. Electronically Signed   By: Dorise Bullion III M.D   On: 05/23/2015 19:06   Ct Head Wo Contrast  05/23/2015  CLINICAL DATA:  Altered mental status. EXAM: CT HEAD WITHOUT CONTRAST TECHNIQUE: Contiguous axial images were obtained from the base of the skull through the vertex without intravenous contrast. COMPARISON:  None. FINDINGS: There is no intracranial hemorrhage, mass or evidence of acute infarction. There is mild generalized atrophy. There is mild chronic microvascular ischemic change. There is no significant extra-axial fluid collection. No acute intracranial findings are evident. The calvarium and skullbase are intact. Visible paranasal sinuses and orbits are unremarkable. IMPRESSION: No acute intracranial findings. There is mild atrophy and chronic appearing white matter hypodensity, likely due to small vessel disease. Electronically Signed   By: Andreas Newport M.D.   On: 05/23/2015 21:31   Ct Chest Wo Contrast  05/23/2015  CLINICAL DATA:  75 year old female with abnormal chest x-ray. Evaluate for pleural effusion. EXAM: CT CHEST WITHOUT CONTRAST TECHNIQUE: Multidetector CT imaging of the chest was performed following the standard protocol without IV contrast. COMPARISON:  Chest radiograph dated 05/23/2015 and CT dated 08/03/2014 FINDINGS: Evaluation of this exam is limited in the absence of intravenous contrast. Evaluation is also limited due to respiratory motion artifact. There is a moderate size left pleural effusion with mass effect and partial compressive atelectasis of the left lower lobe. Superimposed pneumonia or underlying obstructing mass are not  excluded. There is also a small area of consolidative changes with air bronchogram involving the lingula. The right lung is clear. There is no pleural effusion on the right. There is no pneumothorax. The central airways are patent. There is minimal atherosclerotic calcification of the thoracic aorta. The central pulmonary arteries appear grossly unremarkable on this noncontrast study. Top-normal cardiac size. No pericardial effusion. There is no hilar or mediastinal adenopathy. There is a small hiatal hernia. The thyroid gland is heterogeneous and somewhat atrophic. There is a small linear hypodensity in the right thyroid lobe. Ultrasound may provide  better evaluation of the thyroid gland if clinically indicated. There is no axillary adenopathy. The chest wall soft tissues appear unremarkable. There is degenerative changes of the spine. No acute osseous pathology. The visualized upper abdomen appears unremarkable. IMPRESSION: Moderate-sized left pleural effusion with partial compressive atelectasis of the left lower lobe. Electronically Signed   By: Anner Crete M.D.   On: 05/23/2015 22:25    EKG: Independently reviewed. A. fib with rate around 103 bpm.  Assessment/Plan Principal Problem:   Acute respiratory failure with hypoxia (HCC) Active Problems:   Hypertension   Rheumatoid arthritis ([place])   HCAP (healthcare-associated pneumonia)   Pleural effusion, left   Pleuritic chest pain   Acute respiratory failure ([place])    #1. Acute respiratory failure with hypoxia and moderate left pleural effusion - I have discussed with on-call pulmonologist Dr. Oletta Darter who will be seeing patient in consult for possible thoracentesis. Patient's INR is subtherapeutic. We will hold off patient's Coumadin for now in anticipation of procedure. Patient and family is unable to holding Coumadin with known risk for stroke. Since patient was mildly febrile patient is started on empiric antibiotics for now for possible  pneumonia with effusion. Further recommendation based on thoracentesis and clinical course. Note that patient's recent 2-D echo showed pulmonary hypertension. Check troponins. #2. Acute delirium - CT head is unremarkable. Check ammonia levels. Patient has periods of confusion with hallucination. No change in medications as per the family. Check urine drug screen and if confusion persists may need MRI brain. May be also secondary to #1. #3. Chronic atrial fibrillation - rate mildly elevated. Chads 2 vasc score is more than 2. Coumadin on hold due to anticipated thoracentesis. Continue metoprolol. A plate still persistently elevated may need IV Cardizem infusion. #4. Rheumatoid arthritis - holding off immunosuppressants due to possible infectious cause. Since patient is on prednisone I have placed patient on stress dose steroids since patient's initial blood pressure was in the low normal. #5. Hyperlipidemia on statins.   DVT prophylaxis: SCDs in anticipation of procedure. Code Status: Full code.  Family Communication: Patient's daughters at the bedside.  Disposition Plan: Home.  Consults called: Pulmonary consult.  Admission status: Inpatient. Telemetry. Likely stay 2-3 days.    Rise Patience MD [facility] Pager [phone].  If 7PM-7AM, please contact night-coverage [email or web address] Password [email or web address]  05/23/2015, 11:35 PM

## 2015-05-23 NOTE — ED Notes (Signed)
MD at bedside. 

## 2015-05-23 NOTE — ED Provider Notes (Signed)
Medical screening examination/treatment/procedure(s) were conducted as a shared visit with non-physician practitioner(s) and myself.  I personally evaluated the patient during the encounter.   EKG Interpretation None     74yF with CP and confusion. Likely 2/2 pneumonia. Opacity L base and effusion. Correlates to her pain. Febrile. Some confusion and daughter reports "reaching for things that aren't there." o2 sats in low 90s on arrival. Appears comfortable on 2L [place] when I evaluated her. Will CT head although I suspect infectious process is etiology of encephalopathy/delirium.   Virgel Manifold, MD 05/25/15 2033

## 2015-05-23 NOTE — ED Notes (Signed)
PA at bedside.

## 2015-05-23 NOTE — ED Notes (Signed)
Pt daughter states that pt has been reaching out to things that are not present and been seeing objects that are not there for the last 2 days. Daughter denies that pt has any history of Dementia or neurological history.

## 2015-05-23 NOTE — ED Notes (Signed)
Patient transported to CT 

## 2015-05-23 NOTE — Progress Notes (Signed)
Pharmacy Antibiotic Note  [name] is a 74 y.o. female admitted on 05/23/2015 with pneumonia.  Pharmacy has been consulted for vancomycin dosing.  Plan: Vancomycin 1gm IV x 1 in ED then Vancomycin 500mg  IV q12h Follow renal function, cultures, clinical course  Height: 5\' 3"  (160 cm) Weight: 146 lb (66.225 kg) IBW/kg (Calculated) : 52.4  Temp (24hrs), Avg:100.6 F (38.1 C), Min:100.6 F (38.1 C), Max:100.6 F (38.1 C)   Recent Labs Lab 05/23/15 1903 05/23/15 1922  WBC 7.6  --   CREATININE 0.73  --   LATICACIDVEN  --  1.29    Estimated Creatinine Clearance: 57.2 mL/min (by C-G formula based on Cr of 0.73).    Allergies  Allergen Reactions  . Arthrotec [Diclofenac-Misoprostol] Diarrhea  . Erythromycin     Anaphylaxis   . Morphine Sulfate Other (See Comments)    Erratic behavior; hallucinations  . Gold-Containing Drug Products Rash  . Macrodantin [Nitrofurantoin Macrocrystal] Rash  . Naproxen Rash  . Penicillins Rash    amoxicillin    Antimicrobials this admission: 5/7 aztreonam >> 5/7 vanco >> Dose adjustments this admission:   Microbiology results: 5/7 BCx: x2 5/7 UCx:   Thank you for allowing pharmacy to be a part of this patient's care.  Dolly Rias RPh 05/23/2015, 8:55 PM Pager [phone]

## 2015-05-23 NOTE — ED Notes (Signed)
Cardiac hx. Has been having left sided chest pain worsening since Friday, family member states she is currently in A.Fib. Family states the patient began feeling confused on Friday night, worsening since, today was talking "out of her head" and looking dazed, "like her eyes are bulging out." Also c/o weakness, dizziness. Patient appears lethargic in triage. Able to stand unsteadily, on Coumadin currently, denies any injury to head recently.

## 2015-05-23 NOTE — ED Provider Notes (Signed)
CSN: [id number]     Arrival date & time 05/23/15  1838 History   First MD Initiated Contact with Patient 05/23/15 1906     Chief Complaint  Patient presents with  . Chest Pain  . Altered Mental Status     (Consider location/radiation/quality/duration/timing/severity/associated sxs/prior Treatment) Patient is a 75 y.o. female presenting with chest pain and altered mental status. The history is provided by the patient and a relative.  Chest Pain Associated symptoms: altered mental status, cough, fatigue, fever and headache   Associated symptoms: no abdominal pain, no back pain, no dysphagia, no nausea, no numbness, no shortness of breath, not vomiting and no weakness   Altered Mental Status Presenting symptoms: confusion   Associated symptoms: fever and headaches   Associated symptoms: no abdominal pain, no nausea, no rash, no vomiting and no weakness    Patient is a 75 year old female with past medical history of A. Fib on Coumadin, hypertension presents to the emergency department with 5 days of worsening left-sided chest pain, one week of dry cough, and 2 days of altered mental status. Daughter provided most of the history but the patient was able to answer questions clearly. Daughter states the patient has been more lethargic in the past 2 days, bugged eyed, reaching for things that are not there, not remembering things. Patient states left-sided chest pain is worse with deep inspiration. She states the same pain since her discharge roughly one month ago but it is much worse. She states associated chills, fever, weakness. She denies dysuria, nausea, vomiting. Patient endorses a frontal headache that she states has been intermittent since her discharge from the hospital roughly 1 month ago. She did not take anything for the headache.  Past Medical History  Diagnosis Date  . Esophageal reflux   . Rheumatoid arthritis ([place]) 1975  . Osteopenia   . Polymorphic light eruption 2004  .  Hyperlipidemia   . Adhesive capsulitis of left shoulder 1980  . Right middle ear infection 1986  . Acne rosacea   . Allergic rhinitis   . Hypercholesterolemia   . Atrial fibrillation ([place])   . Hypertension   . Edema   . Prolonged QT interval    Past Surgical History  Procedure Laterality Date  . Cardiac catheterization    . Cardioversion    . Temporomandibular joint surgery    . Abdominal hysterectomy     Family History  Problem Relation Age of Onset  . Heart disease Father   . Heart disease Mother   . Other Mother     prolonged qtc  . Heart disease Sister   . Heart disease Sister   . Healthy Sister    Social History  Substance Use Topics  . Smoking status: Never Smoker   . Smokeless tobacco: Never Used  . Alcohol Use: No   OB History    No data available     Review of Systems  Constitutional: Positive for fever, chills and fatigue.  HENT: Negative for ear pain, tinnitus and trouble swallowing.   Eyes: Negative for photophobia and visual disturbance.  Respiratory: Positive for cough. Negative for chest tightness and shortness of breath.   Cardiovascular: Positive for chest pain. Negative for leg swelling.  Gastrointestinal: Negative for nausea, vomiting, abdominal pain, diarrhea and abdominal distention.  Genitourinary: Negative for dysuria and hematuria.  Musculoskeletal: Negative for back pain and neck pain.  Skin: Negative for rash.  Neurological: Positive for headaches. Negative for syncope, weakness and numbness.  Psychiatric/Behavioral: Positive for  confusion.      Allergies  Arthrotec; Erythromycin; Morphine sulfate; Gold-containing drug products; Macrodantin; Naproxen; and Penicillins  Home Medications   Prior to Admission medications   Medication Sig Start Date End Date Taking? Authorizing Provider  acidophilus (RISAQUAD) CAPS capsule Take 1 capsule by mouth daily.   Yes Historical Provider, MD  cholecalciferol (VITAMIN D) 1000 UNITS tablet Take  2,000 Units by mouth daily.   Yes Historical Provider, MD  Etanercept (ENBREL [place]) Take one (1) injection subcutaneous every Friday.   Yes Historical Provider, MD  fluticasone (FLONASE) 50 MCG/ACT nasal spray Place 2 sprays into both nostrils daily as needed for allergies or rhinitis.    Yes Historical Provider, MD  folic acid (FOLVITE) 1 MG tablet Take 2 mg by mouth daily.    Yes Historical Provider, MD  furosemide (LASIX) 40 MG tablet Take 20 mg by mouth daily.    Yes Historical Provider, MD  hydroxypropyl methylcellulose / hypromellose (ISOPTO TEARS / GONIOVISC) 2.5 % ophthalmic solution Place 1 drop into both eyes 4 (four) times daily as needed for dry eyes.   Yes Historical Provider, MD  methotrexate (RHEUMATREX) 2.5 MG tablet Take three (3) tablets (7.5 mg total) by mouth every Tuesday; Take two (2) tablets (5 mg total) by mouth every Wednesday. 08/29/14  Yes Historical Provider, MD  metoprolol succinate (TOPROL-XL) 25 MG 24 hr tablet Take 1 tablet (25 mg total) by mouth every evening. 05/18/15  Yes Jettie Booze, MD  metoprolol succinate (TOPROL-XL) 50 MG 24 hr tablet Take 1 tablet (50 mg total) by mouth daily. Take with or immediately following a meal. 05/18/15  Yes Jettie Booze, MD  Multiple Vitamins-Minerals (MULTIVITAMIN WITH MINERALS) tablet Take 1 tablet by mouth daily.   Yes Historical Provider, MD  omeprazole (PRILOSEC) 20 MG capsule Take 20 mg by mouth every morning.    Yes Historical Provider, MD  pravastatin (PRAVACHOL) 20 MG tablet TAKE 1 TABLET EVERY EVENING 06/11/14  Yes Jettie Booze, MD  predniSONE (DELTASONE) 5 MG tablet Take 2.5 mg by mouth daily with breakfast.    Yes Historical Provider, MD  traMADol (ULTRAM) 50 MG tablet Take 1 tablet (50 mg total) by mouth every 6 (six) hours as needed for severe pain. 08/06/14  Yes Velvet Bathe, MD  warfarin (COUMADIN) 5 MG tablet TAKE 1 TABLET BY MOUTH DAILY OR AS DIRECTED 03/08/15  Yes Jettie Booze, MD   BP 110/79 mmHg   Pulse 63  Temp(Src) 100.6 F (38.1 C) (Oral)  Resp 29  Ht 5\' 3"  (1.6 m)  Wt 66.225 kg  BMI 25.87 kg/m2  SpO2 99% Physical Exam   Physical Exam  Constitutional: Pt is oriented to person, place, and time. Pt appears well-developed and well-nourished. No distress. Pt resting in the bed.  HENT:  Head: Normocephalic and atraumatic.  Mouth/Throat: Oropharynx is clear and moist.  Eyes: Conjunctivae and EOM are normal. Pupils are equal, round, and reactive to light. No scleral icterus.  No horizontal, vertical or rotational nystagmus  Neck: Normal range of motion. Neck supple.  Full active and passive ROM without pain No nuchal rigidity or meningeal signs  Cardiovascular: tachycardic, irregular rhythm and intact distal pulses.   Pulmonary/Chest: Effort normal No respiratory distress. Pt has no wheezes. No rales.  decreased breath sounds on the left Abdominal: Soft. Bowel sounds are normal. There is no tenderness. There is no rebound and no guarding.  Musculoskeletal: Normal range of motion.  Neurological:No cranial nerve deficit.  Exhibits normal  muscle tone. Mental Status:  Alert, oriented, thought content appropriate. Speech fluent without evidence of aphasia. Able to follow 2 step commands without difficulty.  Cranial Nerves:  II:  vision grossly normal, pupils equal, round, reactive to light III,IV, VI: ptosis not present, extra-ocular motions intact bilaterally  V,VII: smile symmetric, facial light touch sensation equal VIII: hearing grossly normal bilaterally  IX,X: midline uvula rise  XI: bilateral shoulder shrug equal and strong XII: midline tongue extension  Motor:  5/5 in upper and lower extremities bilaterally including strong and equal grip strength and dorsiflexion/plantar flexion Sensory: light touch normal in all extremities.  CV: distal pulses palpable throughout   Skin: Skin is warm and dry. No rash noted. Pt is not diaphoretic.  Psychiatric:  Behavior is abnormal:  patient's eyes open widely intermittently, abnormal hand movements, reaching for things that are not there. Pt states her head is not right.  Nursing note and vitals reviewed.    ED Course  Procedures (including critical care time) Labs Review Labs Reviewed  COMPREHENSIVE METABOLIC PANEL - Abnormal; Notable for the following:    Chloride 100 (*)    Glucose, Bld 108 (*)    All other components within normal limits  CBC WITH DIFFERENTIAL/PLATELET - Abnormal; Notable for the following:    RDW 15.6 (*)    All other components within normal limits  URINALYSIS, ROUTINE W REFLEX MICROSCOPIC (NOT AT [facility]) - Abnormal; Notable for the following:    Color, Urine AMBER (*)    Hgb urine dipstick MODERATE (*)    Protein, ur 30 (*)    All other components within normal limits  PROTIME-INR - Abnormal; Notable for the following:    Prothrombin Time 17.0 (*)    All other components within normal limits  URINE MICROSCOPIC-ADD ON - Abnormal; Notable for the following:    Squamous Epithelial / LPF 0-5 (*)    Bacteria, UA RARE (*)    Casts HYALINE CASTS (*)    All other components within normal limits  CBG MONITORING, ED - Abnormal; Notable for the following:    Glucose-Capillary 102 (*)    All other components within normal limits  CULTURE, BLOOD (ROUTINE X 2)  CULTURE, BLOOD (ROUTINE X 2)  URINE CULTURE  CULTURE, EXPECTORATED SPUTUM-ASSESSMENT  GRAM STAIN  URINE RAPID DRUG SCREEN, HOSP PERFORMED  TROPONIN I  AMMONIA  STREP PNEUMONIAE URINARY ANTIGEN  COMPREHENSIVE METABOLIC PANEL  CBC  LEGIONELLA PNEUMOPHILA SEROGP 1 UR AG  TROPONIN I  TROPONIN I  I-STAT CG4 LACTIC ACID, ED  I-STAT TROPOININ, ED  I-STAT CG4 LACTIC ACID, ED    Imaging Review Dg Chest 2 View  05/23/2015  CLINICAL DATA:  Cough and fever EXAM: CHEST  2 VIEW COMPARISON:  April 27, 2015 FINDINGS: No pneumothorax. Stable cardiomegaly. Increasing effusion and opacity in the left base. Mild pulmonary venous congestion. No  other acute abnormalities. IMPRESSION: Increasing effusion and opacity in the left base. Recommend follow-up to resolution. Mild pulmonary venous congestion. Electronically Signed   By: Dorise Bullion III M.D   On: 05/23/2015 19:06   Ct Head Wo Contrast  05/23/2015  CLINICAL DATA:  Altered mental status. EXAM: CT HEAD WITHOUT CONTRAST TECHNIQUE: Contiguous axial images were obtained from the base of the skull through the vertex without intravenous contrast. COMPARISON:  None. FINDINGS: There is no intracranial hemorrhage, mass or evidence of acute infarction. There is mild generalized atrophy. There is mild chronic microvascular ischemic change. There is no significant extra-axial fluid collection. No acute  intracranial findings are evident. The calvarium and skullbase are intact. Visible paranasal sinuses and orbits are unremarkable. IMPRESSION: No acute intracranial findings. There is mild atrophy and chronic appearing white matter hypodensity, likely due to small vessel disease. Electronically Signed   By: Andreas Newport M.D.   On: 05/23/2015 21:31   Ct Chest Wo Contrast  05/23/2015  CLINICAL DATA:  75 year old female with abnormal chest x-ray. Evaluate for pleural effusion. EXAM: CT CHEST WITHOUT CONTRAST TECHNIQUE: Multidetector CT imaging of the chest was performed following the standard protocol without IV contrast. COMPARISON:  Chest radiograph dated 05/23/2015 and CT dated 08/03/2014 FINDINGS: Evaluation of this exam is limited in the absence of intravenous contrast. Evaluation is also limited due to respiratory motion artifact. There is a moderate size left pleural effusion with mass effect and partial compressive atelectasis of the left lower lobe. Superimposed pneumonia or underlying obstructing mass are not excluded. There is also a small area of consolidative changes with air bronchogram involving the lingula. The right lung is clear. There is no pleural effusion on the right. There is no  pneumothorax. The central airways are patent. There is minimal atherosclerotic calcification of the thoracic aorta. The central pulmonary arteries appear grossly unremarkable on this noncontrast study. Top-normal cardiac size. No pericardial effusion. There is no hilar or mediastinal adenopathy. There is a small hiatal hernia. The thyroid gland is heterogeneous and somewhat atrophic. There is a small linear hypodensity in the right thyroid lobe. Ultrasound may provide better evaluation of the thyroid gland if clinically indicated. There is no axillary adenopathy. The chest wall soft tissues appear unremarkable. There is degenerative changes of the spine. No acute osseous pathology. The visualized upper abdomen appears unremarkable. IMPRESSION: Moderate-sized left pleural effusion with partial compressive atelectasis of the left lower lobe. Electronically Signed   By: Anner Crete M.D.   On: 05/23/2015 22:25   I have personally reviewed and evaluated these images and lab results as part of my medical decision-making.   EKG Interpretation None      MDM   Final diagnoses:  HCAP (healthcare-associated pneumonia)    Febrile pt with left-sided chest pain, worse with inspiration, confusion, low oxygen saturation likely secondary to pneumonia. Opacity in left base and effusion was found on chest x-ray. Ordered a CT scan to rule out intracranial hemorrhage since patient is on Coumadin although she denies trauma. CT scan was negative for any hemorrhage or acute intracranial findings. Infectious process and low oxygen saturations likely the etiology of her delirium. Started pt on IV antibiotics. Pt was hospitalized roughly one month ago so will treat for HAP.   Dr. Wilson Singer consulted the hospitalist team who will admit the patient for further evaluation and treatment.   Kalman Drape, PA 05/24/15 0123  Virgel Manifold, MD 05/30/15 [phone]

## 2015-05-23 NOTE — ED Notes (Signed)
Provider at bedside to discuss plan of care with pt and family.

## 2015-05-23 NOTE — ED Notes (Signed)
Unable to collect labs PT taking to [facility]

## 2015-05-24 ENCOUNTER — Inpatient Hospital Stay (HOSPITAL_COMMUNITY): Payer: Medicare Other

## 2015-05-24 ENCOUNTER — Encounter (HOSPITAL_COMMUNITY): Payer: Self-pay | Admitting: Radiology

## 2015-05-24 DIAGNOSIS — J90 Pleural effusion, not elsewhere classified: Secondary | ICD-10-CM | POA: Insufficient documentation

## 2015-05-24 DIAGNOSIS — R0600 Dyspnea, unspecified: Secondary | ICD-10-CM

## 2015-05-24 DIAGNOSIS — J948 Other specified pleural conditions: Secondary | ICD-10-CM

## 2015-05-24 DIAGNOSIS — I482 Chronic atrial fibrillation: Secondary | ICD-10-CM

## 2015-05-24 DIAGNOSIS — G9340 Encephalopathy, unspecified: Secondary | ICD-10-CM

## 2015-05-24 LAB — CBC
HCT: 39 % (ref 36.0–46.0)
Hemoglobin: 13.2 g/dL (ref 12.0–15.0)
MCH: 30.7 pg (ref 26.0–34.0)
MCHC: 33.8 g/dL (ref 30.0–36.0)
MCV: 90.7 fL (ref 78.0–100.0)
Platelets: 224 10*3/uL (ref 150–400)
RBC: 4.3 MIL/uL (ref 3.87–5.11)
RDW: 15.6 % — AB (ref 11.5–15.5)
WBC: 7.6 10*3/uL (ref 4.0–10.5)

## 2015-05-24 LAB — COMPREHENSIVE METABOLIC PANEL
ALK PHOS: 65 U/L (ref 38–126)
ALT: 20 U/L (ref 14–54)
AST: 20 U/L (ref 15–41)
Albumin: 3.5 g/dL (ref 3.5–5.0)
Anion gap: 11 (ref 5–15)
BILIRUBIN TOTAL: 1 mg/dL (ref 0.3–1.2)
BUN: 11 mg/dL (ref 6–20)
CALCIUM: 9.1 mg/dL (ref 8.9–10.3)
CO2: 26 mmol/L (ref 22–32)
CREATININE: 0.81 mg/dL (ref 0.44–1.00)
Chloride: 101 mmol/L (ref 101–111)
GFR calc non Af Amer: >60 mL/min (ref >60)
GLUCOSE: 163 mg/dL — AB (ref 65–99)
Potassium: 4 mmol/L (ref 3.5–5.1)
Sodium: 138 mmol/L (ref 135–145)
TOTAL PROTEIN: 7.1 g/dL (ref 6.5–8.1)

## 2015-05-24 LAB — CHOLESTEROL, TOTAL: CHOLESTEROL: 134 mg/dL (ref 0–200)

## 2015-05-24 LAB — PROTIME-INR
INR: 1.43 (ref 0.00–1.49)
PROTHROMBIN TIME: 17.1 s — AB (ref 11.6–15.2)

## 2015-05-24 LAB — GLUCOSE, SEROUS FLUID: GLUCOSE FL: 115 mg/dL

## 2015-05-24 LAB — TROPONIN I: Troponin I: <0.03 ng/mL (ref <0.031)

## 2015-05-24 LAB — PROTEIN, BODY FLUID: TOTAL PROTEIN, FLUID: 4.2 g/dL

## 2015-05-24 LAB — BODY FLUID CELL COUNT WITH DIFFERENTIAL
EOS FL: 1 %
LYMPHS FL: 10 %
Monocyte-Macrophage-Serous Fluid: 8 % — ABNORMAL LOW (ref 50–90)
NEUTROPHIL FLUID: 81 % — AB (ref 0–25)
Total Nucleated Cell Count, Fluid: 9058 cu mm — ABNORMAL HIGH (ref 0–1000)

## 2015-05-24 LAB — LACTATE DEHYDROGENASE, PLEURAL OR PERITONEAL FLUID: LD FL: 313 U/L — AB (ref 3–23)

## 2015-05-24 LAB — STREP PNEUMONIAE URINARY ANTIGEN: STREP PNEUMO URINARY ANTIGEN: NEGATIVE

## 2015-05-24 LAB — AMMONIA: AMMONIA: 12 umol/L (ref 9–35)

## 2015-05-24 LAB — PROTEIN, TOTAL: TOTAL PROTEIN: 6.5 g/dL (ref 6.5–8.1)

## 2015-05-24 LAB — LACTATE DEHYDROGENASE: LDH: 161 U/L (ref 98–192)

## 2015-05-24 MED ORDER — ENSURE ENLIVE PO LIQD
237.0000 mL | Freq: Two times a day (BID) | ORAL | Status: DC
  Administered 2015-05-26 – 2015-06-01 (×9): 237 mL via ORAL

## 2015-05-24 MED ORDER — CEFEPIME HCL 1 G IJ SOLR
1.0000 g | Freq: Three times a day (TID) | INTRAMUSCULAR | Status: DC
  Administered 2015-05-24 – 2015-05-28 (×13): 1 g via INTRAVENOUS
  Filled 2015-05-24 (×13): qty 1

## 2015-05-24 MED ORDER — ACETAMINOPHEN 325 MG PO TABS
650.0000 mg | ORAL_TABLET | Freq: Four times a day (QID) | ORAL | Status: DC | PRN
  Administered 2015-05-24 – 2015-05-28 (×4): 650 mg via ORAL
  Filled 2015-05-24 (×4): qty 2

## 2015-05-24 MED ORDER — OXYCODONE HCL 5 MG PO TABS: 5.0000 mg | ORAL_TABLET | Freq: Four times a day (QID) | ORAL | Status: DC | PRN

## 2015-05-24 NOTE — Progress Notes (Signed)
Pharmacy Antibiotic Note  [name] is a 75 y.o. female w/ hx of RA on Enbrel, MTX and prednisone PTA admitted on 05/23/2015 from with c/o SOB and AMS. Chest Ct on 5/7 showed moderate sized left pleural effusion w/ partial compression & atelectasis of LLL. Vancomycin and aztreonam started in ED for suspected sepsis. Of note pt does have PCN allergy but tolerated ceftriaxone in the past. Spoke to Dr. Judd Lien, Burns to change aztreonam to cefepime. Pharmacy has been consulted for Cefepime dosing.  Plan: Initiate cefepime 1g q8h Continue Vancomycin 500 mg q12h Follow renal function, cultures, clinical course   Height: 5\' 3"  (160 cm) Weight: 146 lb (66.225 kg) IBW/kg (Calculated) : 52.4  Temp (24hrs), Avg:98.9 F (37.2 C), Min:97.5 F (36.4 C), Max:100.6 F (38.1 C)   Recent Labs Lab 05/23/15 1903 05/23/15 1922 05/23/15 2223 05/24/15 0540  WBC 7.6  --   --  7.6  CREATININE 0.73  --   --  0.81  LATICACIDVEN  --  1.29 0.90  --     Estimated Creatinine Clearance: 56.5 mL/min (by C-G formula based on Cr of 0.81).    Allergies  Allergen Reactions  . Arthrotec [Diclofenac-Misoprostol] Diarrhea  . Erythromycin     Anaphylaxis   . Morphine Sulfate Other (See Comments)    Erratic behavior; hallucinations  . Gold-Containing Drug Products Rash  . Macrodantin [Nitrofurantoin Macrocrystal] Rash  . Naproxen Rash  . Penicillins Rash    amoxicillin    Antimicrobials this admission: Aztreonam 5/7 >> 5/8 Vancomycin 5/7 >> Cefepime 5/8 >>  Dose adjustments this admission: N/A  Microbiology results: 5/7 BCx: Pending 5/7 UCx: Pending  5/7 Sputum: To be collected   Thank you for allowing pharmacy to be a part of this patient's care.  Cheral Almas 05/24/2015 8:27 AM

## 2015-05-24 NOTE — Progress Notes (Signed)
Nutrition Brief Note  Patient identified on the Malnutrition Screening Tool (MST) Report  Pt with insignificant weight loss over the last month. Pt eating 100% and receiving Ensure supplements BID.  Wt Readings from Last 15 Encounters:  05/23/15 146 lb (66.225 kg)  04/24/15 152 lb 5.4 oz (69.1 kg)  09/10/14 155 lb 6.4 oz (70.489 kg)  08/06/14 153 lb 9.6 oz (69.673 kg)  05/25/14 159 lb (72.122 kg)  05/19/13 162 lb (73.483 kg)    Body mass index is 25.87 kg/(m^2). Patient meets criteria for overweight based on current BMI.   Current diet order is Regular, patient is consuming approximately 100% of meals at this time. Labs and medications reviewed.   No nutrition interventions warranted at this time. If nutrition issues arise, please consult RD.   Clayton Bibles, MS, RD, LDN Pager: [phone] After Hours Pager: [phone]

## 2015-05-24 NOTE — Procedures (Signed)
Thoracentesis Procedure Note  Pre-operative Diagnosis: left pleural effusion   Post-operative Diagnosis: left effusion; possibly loculated.   Indications: evaluation of pleural fluid  Procedure Details  Consent: Informed consent was obtained. Risks of the procedure were discussed including: infection, bleeding, pain, pneumothorax.  Under sterile conditions the patient was positioned. Betadine solution and sterile drapes were utilized.  1% buffered lidocaine was used to anesthetize the posterior rib space which was identified via real time [place]. Fluid was obtained without any difficulties and minimal blood loss.  A dressing was applied to the wound and wound care instructions were provided.   Findings 675 ml of bloody-rust colored pleural fluid was obtained. A sample was sent to Pathology for cell counts, as well as for infection analysis. Post-thora [place] w/ positive Sliding lung sign but still what appears to be mod sized pleural collection   Complications:  None; patient tolerated the procedure well.          Condition: stable  Plan A follow up chest x-ray was ordered. Bed Rest for 0 hours. Tylenol 650 mg. for pain.  Erick Colace ACNP-BC [place] Pager # [phone] OR # [phone] if no answer

## 2015-05-24 NOTE — Progress Notes (Deleted)
BS is a 13 YOF admitted w/ CC of SOB and AMS. PMH is significant for esophogeal reflux, RA, HLD, HTN, Afib/ Home meds include metoprolol succinate XL 75 mg qPM, pravastatin 20 mg qPM, tramadol 50 mg q6h prn, warfarin 5 mg QD, risaquad 1 cap QD, cholecalciferol 2000 units daily, Enbrel once weekly, flonase 50 mcg 2 sprays both nostrils daily prn, folic acid 2 mg QD, furosemide 20mg  daily, isopto tears 1 drop OU q4h prn, MTX 7.5 mg on Tues, 5 mg on Wed, MVI daily, omeprazole 20 mg qAM, prednisone 2.5 mg qAM WM.  ARF: Chest Ct on 5/7 showed moderate sized left pleural effusion w/ partial compression & atelectasis of LLL On empiric tx of vancomycin 500 mg q12h and cefepime 1g q8h while Cx pending. Aztreonam started then d/c once PCN allergy r/o. BCx and UCx pending. Continue to monitor renal; function. Afebrile on 5/8, WBC 7.6, RR 20+, O2 sats 95+.   Delirium: Head CT unremarkable. Ammonia normal at 12, Urine has rare growth but cultures pending. Hgb and protein noted. Urine tox negative.   Afib: Pt may undergo thoracentesis so warfarin is on hold. CHADS-VASc = 3. INR low @ 1.43 on admission, goal 2-3. Metoprolol XL 75 mg TDD for rate control. HR varies 97-122. Goal <110     [id number] meds on hold due to probable infection. Pt now on SoluCortef 50 mg BID.   HLD: Continued on pravastatin 20 mg qPM.   Pantoprazole 40 mg PO daily for GI ppx. NS 10 mL/hr continuous. No use of flonase or eye drops.

## 2015-05-24 NOTE — Consult Note (Signed)
PULMONARY / CRITICAL CARE MEDICINE   Name: [name] MRN: [id number] DOB: Mar 21, 1940    ADMISSION DATE:  05/23/2015 CONSULTATION DATE:  05/24/2015   REFERRING MD:  [name]  CHIEF COMPLAINT:  Dyspnea  HISTORY OF PRESENT ILLNESS:   75 year old female with a past medical history significant for rheumatoid arthritis was admitted on 05/23/2015 from the [facility] emergency department in the setting of progressive confusion and shortness of breath. She was noted to have a left-sided pleural effusion. Pulmonary and critical care medicine was consulted. Of note, the patient has atrial fibrillation takes warfarin.  She was recently admitted to [facility] where she had increasing shortness of breath, atrial fibrillation, and an echocardiogram that showed evidence of pulmonary hypertension. She was treated with rate control, warfarin, and some diuresis. During that hospital visit pulmonary medicine was consulted and recommended continued treatment for A. fib with follow-up in the outpatient pulmonary clinic.  She tells me that for the last several weeks she's been steadily worsening in terms of shortness of breath. She says that she become short of breath with any movement, talking, or cough. She notes a left-sided sharp chest pain which is worse with deep breaths and coughs. She is not coughing up mucus, she denies fevers or chills. She states that she has been compliant with her medical therapy including Enbrel and methotrexate which she says has been giving very good control over her rheumatoid arthritis recently.   PAST MEDICAL HISTORY :  She  has a past medical history of Esophageal reflux; Rheumatoid arthritis ([place]) (1975); Osteopenia; Polymorphic light eruption (2004); Hyperlipidemia; Adhesive capsulitis of left shoulder (1980); Right middle ear infection (1986); Acne rosacea; Allergic rhinitis; Hypercholesterolemia; Atrial fibrillation ([place]); Hypertension; Edema; and  Prolonged QT interval.  PAST SURGICAL HISTORY: She  has past surgical history that includes Cardiac catheterization; Cardioversion; Temporomandibular joint surgery; and Abdominal hysterectomy.  Allergies  Allergen Reactions  . Arthrotec [Diclofenac-Misoprostol] Diarrhea  . Erythromycin     Anaphylaxis   . Morphine Sulfate Other (See Comments)    Erratic behavior; hallucinations  . Gold-Containing Drug Products Rash  . Macrodantin [Nitrofurantoin Macrocrystal] Rash  . Naproxen Rash  . Penicillins Rash    amoxicillin    No current facility-administered medications on file prior to encounter.   Current Outpatient Prescriptions on File Prior to Encounter  Medication Sig  . acidophilus (RISAQUAD) CAPS capsule Take 1 capsule by mouth daily.  . cholecalciferol (VITAMIN D) 1000 UNITS tablet Take 2,000 Units by mouth daily.  . Etanercept (ENBREL Exira) Take one (1) injection subcutaneous every Friday.  . fluticasone (FLONASE) 50 MCG/ACT nasal spray Place 2 sprays into both nostrils daily as needed for allergies or rhinitis.   . folic acid (FOLVITE) 1 MG tablet Take 2 mg by mouth daily.   . furosemide (LASIX) 40 MG tablet Take 20 mg by mouth daily.   . hydroxypropyl methylcellulose / hypromellose (ISOPTO TEARS / GONIOVISC) 2.5 % ophthalmic solution Place 1 drop into both eyes 4 (four) times daily as needed for dry eyes.  . methotrexate (RHEUMATREX) 2.5 MG tablet Take three (3) tablets (7.5 mg total) by mouth every Tuesday; Take two (2) tablets (5 mg total) by mouth every Wednesday.  . metoprolol succinate (TOPROL-XL) 25 MG 24 hr tablet Take 1 tablet (25 mg total) by mouth every evening.  . metoprolol succinate (TOPROL-XL) 50 MG 24 hr tablet Take 1 tablet (50 mg total) by mouth daily. Take with or immediately following a meal.  . Multiple  Vitamins-Minerals (MULTIVITAMIN WITH MINERALS) tablet Take 1 tablet by mouth daily.  Marland Kitchen omeprazole (PRILOSEC) 20 MG capsule Take 20 mg by mouth every morning.    . pravastatin (PRAVACHOL) 20 MG tablet TAKE 1 TABLET EVERY EVENING  . predniSONE (DELTASONE) 5 MG tablet Take 2.5 mg by mouth daily with breakfast.   . traMADol (ULTRAM) 50 MG tablet Take 1 tablet (50 mg total) by mouth every 6 (six) hours as needed for severe pain.  Marland Kitchen warfarin (COUMADIN) 5 MG tablet TAKE 1 TABLET BY MOUTH DAILY OR AS DIRECTED    FAMILY HISTORY:  Her indicated that her mother is deceased. She indicated that her father is deceased. She indicated that all of her three sisters are alive. She indicated that her maternal grandmother is deceased. She indicated that her maternal grandfather is deceased. She indicated that her paternal grandmother is deceased. She indicated that her paternal grandfather is deceased.   SOCIAL HISTORY: She  reports that she has never smoked. She has never used smokeless tobacco. She reports that she does not drink alcohol or use illicit drugs.  REVIEW OF SYSTEMS:   Gen: Denies fever, chills, weight change, fatigue, night sweats HEENT: Denies blurred vision, double vision, hearing loss, tinnitus, sinus congestion, rhinorrhea, sore throat, neck stiffness, dysphagia PULM: per HPI CV: per HPI GI: Denies abdominal pain, nausea, vomiting, diarrhea, hematochezia, melena, constipation, change in bowel habits GU: Denies dysuria, hematuria, polyuria, oliguria, urethral discharge Endocrine: Denies hot or cold intolerance, polyuria, polyphagia or appetite change Derm: Denies rash, dry skin, scaling or peeling skin change Heme: Denies easy bruising, bleeding, bleeding gums Neuro: Denies headache, numbness, weakness, slurred speech, loss of memory or consciousness   SUBJECTIVE:  As above  VITAL SIGNS: BP 100/58 mmHg  Pulse 106  Temp(Src) 97.5 F (36.4 C) (Oral)  Resp 20  Ht 5\' 3"  (1.6 m)  Wt 146 lb (66.225 kg)  BMI 25.87 kg/m2  SpO2 100%  HEMODYNAMICS:    VENTILATOR SETTINGS:    INTAKE / OUTPUT: I/O last 3 completed shifts: In: -  Out: 30  [Urine:30]  PHYSICAL EXAMINATION: General:  Pleasant female, sitting up in bed, mild respiratory distress Neuro:  Awake, alert, oriented 4, moves all 4 extremities HEENT:  Normocephalic/atraumatic, oropharynx clear, extraocular movements intact, sclera anicteric Cardiovascular:  Irregularly irregular rhythm, tachycardic, no murmur Lungs:  Diminished left base, clear to auscultation elsewhere, normal respiratory effort Abdomen:  Bowel sounds positive, nontender nondistended Musculoskeletal:  Normal bulk and tone Skin:  No bruising or rash  LABS:  BMET  Recent Labs Lab 05/23/15 1903 05/24/15 0540  NA 136 138  K 3.5 4.0  CL 100* 101  CO2 26 26  BUN 11 11  CREATININE 0.73 0.81  GLUCOSE 108* 163*    Electrolytes  Recent Labs Lab 05/23/15 1903 05/24/15 0540  CALCIUM 8.9 9.1    CBC  Recent Labs Lab 05/23/15 1903 05/24/15 0540  WBC 7.6 7.6  HGB 13.5 13.2  HCT 41.5 39.0  PLT 237 224    Coag's  Recent Labs Lab 05/18/15 0955 05/23/15 1903 05/24/15 0705  INR 2.1 1.43 1.43    Sepsis Markers  Recent Labs Lab 05/23/15 1922 05/23/15 2223  LATICACIDVEN 1.29 0.90    ABG No results for input(s): PHART, PCO2ART, PO2ART in the last 168 hours.  Liver Enzymes  Recent Labs Lab 05/23/15 1903 05/24/15 0540  AST 21 20  ALT 20 20  ALKPHOS 65 65  BILITOT 0.8 1.0  ALBUMIN 3.7 3.5    Cardiac  Enzymes  Recent Labs Lab 05/24/15 0006 05/24/15 0540  TROPONINI <0.03 <0.03    Glucose  Recent Labs Lab 05/23/15 1903  GLUCAP 102*    Imaging  05/23/2015 CT chest images personally reviewed showing a moderate sized pleural effusion with compressive atelectasis of the left lower lobe. Questionably there is a few patches of consolidation in the lingula. 05/23/2015 CT head no acute intracranial process.  STUDIES:    CULTURES: 5/7 Blood >  5/7 urine >   ANTIBIOTICS: 5/7 vanc >  5/7 cefepime >   SIGNIFICANT  EVENTS:   LINES/TUBES:   DISCUSSION: This is a 75 year old female with a past medical history significant for rheumatoid arthritis who presented to [facility] on 05/24/2015 with a new left-sided pleural effusion in the setting of confusion.The differential diagnosis of her infusion includes a transudate considering the recent cardiac issues versus an exudative process secondary to her underlying rheumatoid arthritis or less likely an infection or malignancy.  ASSESSMENT / PLAN:  PULMONARY A: Pleural effusion Dyspnea P:   We will perform a thoracentesis today and send for typical studies to evaluate for exudates and transudates Continue to hold warfarin  CARDIAC A:  Atrial fibrillation P: Continue rate control Hold warfarin today for thoracentesis   FAMILY  - Updates: none bedside  Roselie Awkward, MD [place] [facility] Pager: [phone] Cell: [phone] After 3pm or if no response, call [phone]   05/24/2015, 9:05 AM

## 2015-05-24 NOTE — Progress Notes (Addendum)
PROGRESS NOTE  [name] [id number] DOB: [birth year] DOA: 05/23/2015 PCP: Lottie Dawson, MD  HPI/Recap of past 48 hours: 75 year old female with past medical history of chronic atrial fibrillation, rheumatoid arthritis and hypertension admitted on the night of 5/7 after several days of increased confusion. Patient had also been increasingly short of breath with complaints of left-sided pleuritic chest pain. Emergency room, lab work unremarkable and CT of the head was as well. CT scan of the chest noted moderate pleural effusion and patient noted to be hypoxic.  Patient started on oxygen, IV antibiotics and pulmonary consulted.  With oxygenation, patient's mentation improved. Seen by pulmonary and patient underwent left-sided thoracentesis yielding a normal 700 mL's of bloody rust colored fluid.  Patient seen after tap.  Some residual left-sided pain, but overall breathing feels a little bit better.  Assessment/Plan: Principal Problem:   Acute respiratory failure with hypoxia (HCC) secondary to left-sided exudative pleural effusion, presumed infectious. Continue antibiotics. Awaiting culture results along with cytology. 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 On stress dose steroids Acute encephalopathy: Secondary to hypoxia. Improving with oxygenation Atrial fibrillation: Borderline tachycardic given hypoxia. Coumadin on hold for thoracentesis,   Code Status: Full code   Family Communication: Sisters and granddaughter at the bedside   Disposition Plan: Depending on cytology results, anticipate inpatient stay for at least the next few days    Consultants:  Pulmonary   Procedures:  Status post thoracentesis of left lung done 5/8:635ml of fluid removed    Antimicrobials:   IV Azactam 5/71 dose  Cefepime 5/8-present  Vancomycin 5/8 test  present  DVT prophylaxis: SCDs, Coumadin on hold-continue to hold as we may need to perform second tap   Objective: Filed Vitals:   05/23/15 2230 05/23/15 2256 05/24/15 0638 05/24/15 1437  BP:  114/76 100/58 107/54  Pulse:  85 106 84  Temp: 99.2 F (37.3 C) 98 F (36.7 C) 97.5 F (36.4 C) 97.9 F (36.6 C)  TempSrc:  Axillary Oral Oral  Resp:  23 20 18   Height:  5\' 3"  (1.6 m)    Weight:  66.225 kg (146 lb)    SpO2:  99% 100% 96%    Intake/Output Summary (Last 24 hours) at 05/24/15 1501 Last data filed at 05/24/15 1438  Gross per 24 hour  Intake    600 ml  Output    230 ml  Net    370 ml   Filed Weights   05/23/15 1931 05/23/15 2256  Weight: 66.225 kg (146 lb) 66.225 kg (146 lb)    Exam:   General:  Alert and oriented 3, no acute distress   Cardiovascular: Irregular rhythm, borderline tachycardia   Respiratory: Decreased breath sounds left lung midway down   Abdomen: Soft, nontender, nondistended, positive bowel sounds   Musculoskeletal: No clubbing or cyanosis or edema   Skin: No skin breaks, tears or lesions  Psychiatry: Patient is appropriate, no evidence of psychoses    Data Reviewed: CBC:  Recent Labs Lab 05/23/15 1903 05/24/15 0540  WBC 7.6 7.6  NEUTROABS 5.1  --   HGB 13.5 13.2  HCT 41.5 39.0  MCV 92.4 90.7  PLT 237 [id number]   Basic Metabolic Panel:  Recent Labs Lab 05/23/15 1903 05/24/15 0540  NA 136 138  K 3.5 4.0  CL 100* 101  CO2 26 26  GLUCOSE 108* 163*  BUN 11 11  CREATININE 0.73 0.81  CALCIUM 8.9 9.1   GFR: Estimated Creatinine Clearance: 56.5 mL/min (by C-G formula based on Cr of 0.81). Liver Function Tests:  Recent Labs Lab 05/23/15 1903 05/24/15 0540 05/24/15 1230  AST 21 20  --   ALT 20 20  --   ALKPHOS 65 65  --   BILITOT 0.8 1.0  --   PROT 6.6 7.1 6.5  ALBUMIN 3.7 3.5  --    No results for input(s): LIPASE, AMYLASE in the last 168 hours.  Recent Labs Lab 05/24/15 0006  AMMONIA 12   Coagulation  Profile:  Recent Labs Lab 05/18/15 0955 05/23/15 1903 05/24/15 0705  INR 2.1 1.43 1.43   Cardiac Enzymes:  Recent Labs Lab 05/24/15 0006 05/24/15 0540 05/24/15 1107  TROPONINI <0.03 <0.03 <0.03   BNP (last 3 results) No results for input(s): PROBNP in the last 8760 hours. HbA1C: No results for input(s): HGBA1C in the last 72 hours. CBG:  Recent Labs Lab 05/23/15 1903  GLUCAP 102*   Lipid Profile: No results for input(s): CHOL, HDL, LDLCALC, TRIG, CHOLHDL, LDLDIRECT in the last 72 hours. Thyroid Function Tests: No results for input(s): TSH, T4TOTAL, FREET4, T3FREE, THYROIDAB in the last 72 hours. Anemia Panel: No results for input(s): VITAMINB12, FOLATE, FERRITIN, TIBC, IRON, RETICCTPCT in the last 72 hours. Urine analysis:    Component Value Date/Time   COLORURINE AMBER* 05/23/2015 1941   APPEARANCEUR CLEAR 05/23/2015 1941   LABSPEC 1.021 05/23/2015 1941   PHURINE 6.5 05/23/2015 1941   GLUCOSEU NEGATIVE 05/23/2015 1941   HGBUR MODERATE* 05/23/2015 1941   BILIRUBINUR NEGATIVE 05/23/2015 1941   KETONESUR NEGATIVE 05/23/2015 1941   PROTEINUR 30* 05/23/2015 1941   UROBILINOGEN 0.2 10/29/2013 1932   NITRITE NEGATIVE 05/23/2015 1941   LEUKOCYTESUR NEGATIVE 05/23/2015 1941   Sepsis Labs: @LABRCNTIP (procalcitonin:4,lacticidven:4)  )No results found for this or any previous visit (from the past 240 hour(s)).    Studies: Dg Chest 2 View  05/23/2015  CLINICAL DATA:  Cough and fever EXAM: CHEST  2 VIEW COMPARISON:  April 27, 2015 FINDINGS: No pneumothorax. Stable cardiomegaly. Increasing effusion and opacity in the left base. Mild pulmonary venous congestion. No other acute abnormalities. IMPRESSION: Increasing effusion and opacity in the left base. Recommend follow-up to resolution. Mild pulmonary venous congestion. Electronically Signed   By: Dorise Bullion III M.D   On: 05/23/2015 19:06   Ct Head Wo Contrast  05/23/2015  CLINICAL DATA:  Altered mental status. EXAM:  CT HEAD WITHOUT CONTRAST TECHNIQUE: Contiguous axial images were obtained from the base of the skull through the vertex without intravenous contrast. COMPARISON:  None. FINDINGS: There is no intracranial hemorrhage, mass or evidence of acute infarction. There is mild generalized atrophy. There is mild chronic microvascular ischemic change. There is no significant extra-axial fluid collection. No acute intracranial findings are evident. The calvarium and skullbase are intact. Visible paranasal sinuses and orbits are unremarkable. IMPRESSION: No acute intracranial findings. There is mild atrophy and chronic appearing white matter hypodensity, likely due to small vessel disease. Electronically Signed   By: Andreas Newport M.D.   On: 05/23/2015 21:31   Ct Chest Wo Contrast  05/24/2015  CLINICAL DATA:  History of atrial fibrillation, left side chest pain images of the thoracic inlet are unremarkable. Images of the thoracic inlet are unremarkable. Central airways are patent. Or EXAM: CT CHEST WITHOUT CONTRAST TECHNIQUE: Multidetector CT imaging of the chest was performed following the standard protocol without IV contrast. COMPARISON:  CT of the chest 05/23/2015 and CT of  the chest 08/03/2014 FINDINGS: Images of the thoracic inlet are unremarkable. Central airways are patent. Heart size is stable. Small hiatal hernia again noted. There is no mediastinal or hilar adenopathy noted on this unenhanced scan. Again noted moderate size left pleural effusion. Stable consolidation with air bronchogram in left lower lobe posteriorly. This may be due to atelectasis or pneumonia. No definite mass is noted on this unenhanced scan. Again obstructive mass cannot be excluded. Follow-up to resolution is recommended. Persistent small infiltrate or atelectasis in lingula with some air bronchogram. Stable small left loculated diaphragmatic pleural effusion posteriorly. Trace pericardial effusion. The right lung is clear.  No pulmonary  edema. Atherosclerotic calcifications of thoracic aorta again noted. No adrenal gland mass is noted in visualized upper abdomen. The visualized unenhanced pancreas is unremarkable. The visualized upper kidneys and liver are unremarkable. Visualized spleen is unremarkable. Sagittal images of the spine shows no destructive bony lesions. Stable degenerative changes thoracic spine. Sagittal view of the sternum is unremarkable. No destructive rib lesions are noted. IMPRESSION: 1. Again noted moderate left pleural effusion with left lower lobe posterior atelectasis or infiltrate. Underlying mass cannot be entirely excluded. Follow-up to resolution is recommended. Stable atelectasis or infiltrate in lingula with some air bronchogram. 2. No pulmonary edema. 3. No mediastinal or hilar adenopathy.  Small hiatal hernia. 4. No adrenal gland mass is noted. 5. Degenerative changes thoracic spine. Electronically Signed   By: Lahoma Crocker M.D.   On: 05/24/2015 14:00   Ct Chest Wo Contrast  05/23/2015  CLINICAL DATA:  75 year old female with abnormal chest x-ray. Evaluate for pleural effusion. EXAM: CT CHEST WITHOUT CONTRAST TECHNIQUE: Multidetector CT imaging of the chest was performed following the standard protocol without IV contrast. COMPARISON:  Chest radiograph dated 05/23/2015 and CT dated 08/03/2014 FINDINGS: Evaluation of this exam is limited in the absence of intravenous contrast. Evaluation is also limited due to respiratory motion artifact. There is a moderate size left pleural effusion with mass effect and partial compressive atelectasis of the left lower lobe. Superimposed pneumonia or underlying obstructing mass are not excluded. There is also a small area of consolidative changes with air bronchogram involving the lingula. The right lung is clear. There is no pleural effusion on the right. There is no pneumothorax. The central airways are patent. There is minimal atherosclerotic calcification of the thoracic aorta.  The central pulmonary arteries appear grossly unremarkable on this noncontrast study. Top-normal cardiac size. No pericardial effusion. There is no hilar or mediastinal adenopathy. There is a small hiatal hernia. The thyroid gland is heterogeneous and somewhat atrophic. There is a small linear hypodensity in the right thyroid lobe. Ultrasound may provide better evaluation of the thyroid gland if clinically indicated. There is no axillary adenopathy. The chest wall soft tissues appear unremarkable. There is degenerative changes of the spine. No acute osseous pathology. The visualized upper abdomen appears unremarkable. IMPRESSION: Moderate-sized left pleural effusion with partial compressive atelectasis of the left lower lobe. Electronically Signed   By: Anner Crete M.D.   On: 05/23/2015 22:25   Dg Chest Port 1 View  05/24/2015  CLINICAL DATA:  Status post left thoracentesis today. Postprocedural examination. EXAM: PORTABLE CHEST 1 VIEW COMPARISON:  CT chest and PA and lateral chest 05/23/2015. FINDINGS: Left pleural effusion seen on the comparison studies is decreased in size. There is no pneumothorax. Left basilar airspace disease is noted. The right lung appears clear. IMPRESSION: Decreased left effusion after thoracentesis. Negative for pneumothorax. Electronically Signed   By: Marcello Moores  Dalessio M.D.   On: 05/24/2015 12:58    Scheduled Meds: . ceFEPime (MAXIPIME) IV  1 g Intravenous Q8H  . cholecalciferol  2,000 Units Oral Daily  . feeding supplement (ENSURE ENLIVE)  237 mL Oral BID BM  . folic acid  2 mg Oral Daily  . hydrocortisone sod succinate (SOLU-CORTEF) inj  50 mg Intravenous BID  . metoprolol succinate  25 mg Oral QPM  . metoprolol succinate  50 mg Oral Daily  . multivitamin with minerals  1 tablet Oral Daily  . pantoprazole  40 mg Oral Daily  . pravastatin  20 mg Oral QPM  . vancomycin  500 mg Intravenous Q12H    Continuous Infusions: . sodium chloride 10 mL/hr at 05/24/15 0014      LOS: 1 day   Time spent: 15 minutes  Annita Brod, MD [facility] Pager [phone]  If 7PM-7AM, please contact night-coverage [email or web address] Password [email or web address] 05/24/2015, 3:01 PM

## 2015-05-25 ENCOUNTER — Inpatient Hospital Stay (HOSPITAL_COMMUNITY): Payer: Medicare Other

## 2015-05-25 ENCOUNTER — Institutional Professional Consult (permissible substitution): Payer: Medicare Other | Admitting: Pulmonary Disease

## 2015-05-25 DIAGNOSIS — J90 Pleural effusion, not elsewhere classified: Principal | ICD-10-CM

## 2015-05-25 DIAGNOSIS — J189 Pneumonia, unspecified organism: Secondary | ICD-10-CM

## 2015-05-25 DIAGNOSIS — M0579 Rheumatoid arthritis with rheumatoid factor of multiple sites without organ or systems involvement: Secondary | ICD-10-CM

## 2015-05-25 LAB — PH, BODY FLUID: pH, Body Fluid: 7.8

## 2015-05-25 LAB — URINE CULTURE: Culture: NO GROWTH

## 2015-05-25 LAB — LEGIONELLA PNEUMOPHILA SEROGP 1 UR AG: L. PNEUMOPHILA SEROGP 1 UR AG: NEGATIVE

## 2015-05-25 NOTE — Progress Notes (Signed)
PULMONARY / CRITICAL CARE MEDICINE   Name: [name] MRN: [id number] DOB: January 29, 1940    ADMISSION DATE:  05/23/2015 CONSULTATION DATE:  05/24/2015   REFERRING MD:  [name]  CHIEF COMPLAINT:  Dyspnea  BRIEF:   75 y/o female with rheumatoid arthritis admitted with dyspnea in the setting of a left sided effusion.    SUBJECTIVE:  Still has some dyspnea  VITAL SIGNS: BP 104/74 mmHg  Pulse 94  Temp(Src) 98.3 F (36.8 C) (Oral)  Resp 20  Ht 5\' 3"  (1.6 m)  Wt 146 lb (66.225 kg)  BMI 25.87 kg/m2  SpO2 97%  HEMODYNAMICS:    VENTILATOR SETTINGS:    INTAKE / OUTPUT: I/O last 3 completed shifts: In: 1438.7 [P.O.:840; I.V.:248.7; IV Piggyback:350] Out: 480 [Urine:480]  PHYSICAL EXAMINATION: General:  Pleasant female, sitting up in bed, mild respiratory distress Neuro:  Awake, alert, oriented 4, moves all 4 extremities HEENT:  Normocephalic/atraumatic, oropharynx clear, extraocular movements intact, sclera anicteric Cardiovascular:  Irregularly irregular rhythm, tachycardic, no murmur Lungs:  Diminished left base, clear to auscultation elsewhere, normal respiratory effort Abdomen:  Bowel sounds positive, nontender nondistended Musculoskeletal:  Normal bulk and tone Skin:  No bruising or rash  LABS:  BMET  Recent Labs Lab 05/23/15 1903 05/24/15 0540  NA 136 138  K 3.5 4.0  CL 100* 101  CO2 26 26  BUN 11 11  CREATININE 0.73 0.81  GLUCOSE 108* 163*    Electrolytes  Recent Labs Lab 05/23/15 1903 05/24/15 0540  CALCIUM 8.9 9.1    CBC  Recent Labs Lab 05/23/15 1903 05/24/15 0540  WBC 7.6 7.6  HGB 13.5 13.2  HCT 41.5 39.0  PLT 237 224    Coag's  Recent Labs Lab 05/23/15 1903 05/24/15 0705  INR 1.43 1.43    Sepsis Markers  Recent Labs Lab 05/23/15 1922 05/23/15 2223  LATICACIDVEN 1.29 0.90    ABG No results for input(s): PHART, PCO2ART, PO2ART in the last 168 hours.  Liver Enzymes  Recent Labs Lab 05/23/15 1903  05/24/15 0540  AST 21 20  ALT 20 20  ALKPHOS 65 65  BILITOT 0.8 1.0  ALBUMIN 3.7 3.5    Cardiac Enzymes  Recent Labs Lab 05/24/15 0006 05/24/15 0540 05/24/15 1107  TROPONINI <0.03 <0.03 <0.03    Glucose  Recent Labs Lab 05/23/15 1903  GLUCAP 102*    Imaging  05/23/2015 CT chest images personally reviewed showing a moderate sized pleural effusion with compressive atelectasis of the left lower lobe. Questionably there is a few patches of consolidation in the lingula. 05/23/2015 CT head no acute intracranial process. 5/8 CT chest repeat > persistent effusion left base  STUDIES:  5/8 Thoracentesis left> 675 cc out, rusty colored, exudate, 9K WBC, mostly neutrophils   CULTURES: 5/7 Blood >  5/7 urine >   ANTIBIOTICS: 5/7 vanc >  5/7 cefepime >   SIGNIFICANT EVENTS:   LINES/TUBES:   DISCUSSION: This is a 75 year old female with a past medical history significant for rheumatoid arthritis who presented to [facility] on 05/24/2015 with a new left-sided exudative pleural effusion with an underlying area of consolidation.  This most likely represents HCAP with a complicated effusion, but not empyema.  ASSESSMENT / PLAN:  PULMONARY A: Parapneumonic pleural effusion, complicated based on loculation Dyspnea  HCAP P:   I have discussed the case with thoracic surgery, will plan for CT guided chest drain today, if that cannot be scheduled we will perform a pigtail drain Continue to hold warfarin  until after procedure Continue antibiotics, f/u fluid culture  CARDIAC A:  Atrial fibrillation P: Continue rate control Hold warfarin today for pleural drain   FAMILY  - Updates: daughter updated 5/9 bedside  Roselie Awkward, MD [place] [facility] Pager: [phone] Cell: [phone] After 3pm or if no response, call [phone]   05/25/2015, 10:50 AM

## 2015-05-25 NOTE — Progress Notes (Signed)
PROGRESS NOTE  [name] [id number] DOB: 01-Oct-1940 DOA: 05/23/2015 PCP: Lottie Dawson, MD  HPI/Recap of past 64 hours: 75 year old female with past medical history of chronic atrial fibrillation, rheumatoid arthritis and hypertension admitted on the night of 5/7 after several days of increased confusion. Patient had also been increasingly short of breath with complaints of left-sided pleuritic chest pain. Emergency room, lab work unremarkable and CT of the head was as well. CT scan of the chest noted moderate pleural effusion and patient noted to be hypoxic.  Patient started on oxygen, IV antibiotics and pulmonary consulted.  With oxygenation, patient's mentation improved. Seen by pulmonary and patient underwent left-sided thoracentesis yielding a normal 700 mL's of bloody rust colored fluid.  Pulmonary felt that still sig amount of fluid despite tap concerning for empyema.  CVTS consulted with plans for chest tube later today.    Pt seen & doing ok,  Still feels SOB & with cough.  Less pain.  Assessment/Plan: Principal Problem:   Acute respiratory failure with hypoxia (HCC) secondary to left-sided exudative pleural effusion, presumed infectious-concerns for empyema. For chest tube.  Continue antibiotics. Awaiting culture results. 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 On stress dose steroids Acute encephalopathy: Secondary to hypoxia. Improving with oxygenation Atrial fibrillation: Borderline tachycardic given hypoxia. Coumadin on hold for thoracentesis,   Code Status: Full code   Family Communication: daughter at the bedside   Disposition Plan: Likely will be here until the weekend   Consultants:  Pulmonary   CVTS  Procedures:  Status post thoracentesis of left lung done 5/8:632ml of fluid removed    Chest Tube planned  5/9  Antimicrobials:   IV Azactam 5/71 dose  Cefepime 5/8-present  Vancomycin 5/8-present  DVT prophylaxis: SCDs, Coumadin on hold-continue to hold as we may need to perform second tap   Objective: Filed Vitals:   05/24/15 0638 05/24/15 1437 05/24/15 2125 05/25/15 0452  BP: 100/58 107/54 90/64 104/74  Pulse: 106 84 81 94  Temp: 97.5 F (36.4 C) 97.9 F (36.6 C) 98.6 F (37 C) 98.3 F (36.8 C)  TempSrc: Oral Oral Oral Oral  Resp: 20 18 20 20   Height:      Weight:      SpO2: 100% 96% 97% 97%    Intake/Output Summary (Last 24 hours) at 05/25/15 1413 Last data filed at 05/25/15 0106  Gross per 24 hour  Intake 1078.67 ml  Output    250 ml  Net 828.67 ml   Filed Weights   05/23/15 1931 05/23/15 2256  Weight: 66.225 kg (146 lb) 66.225 kg (146 lb)    Exam:   General:  Alert and oriented 3, fatigued easily  Cardiovascular: Irregular rhythm, borderline tachycardia   Respiratory: Decreased breath sounds left lung midway down   Abdomen: Soft, nontender, nondistended, positive bowel sounds   Musculoskeletal: No clubbing or cyanosis or edema   Skin: No skin breaks, tears or lesions  Psychiatry: Patient is appropriate, no evidence of psychoses    Data Reviewed: CBC:  Recent Labs Lab 05/23/15 1903 05/24/15 0540  WBC 7.6 7.6  NEUTROABS 5.1  --   HGB 13.5 13.2  HCT 41.5 39.0  MCV 92.4 90.7  PLT 237 [id number]   Basic Metabolic Panel:  Recent Labs Lab 05/23/15 1903 05/24/15 0540  NA 136 138  K 3.5 4.0  CL 100* 101  CO2 26 26  GLUCOSE 108* 163*  BUN 11 11  CREATININE 0.73 0.81  CALCIUM 8.9 9.1   GFR: Estimated Creatinine Clearance: 56.5 mL/min (by C-G formula based on Cr of 0.81). Liver Function Tests:  Recent Labs Lab 05/23/15 1903 05/24/15 0540 05/24/15 1230  AST 21 20  --   ALT 20 20  --   ALKPHOS 65 65  --   BILITOT 0.8 1.0  --   PROT 6.6 7.1 6.5  ALBUMIN 3.7 3.5  --    No results for input(s): LIPASE, AMYLASE in the last 168  hours.  Recent Labs Lab 05/24/15 0006  AMMONIA 12   Coagulation Profile:  Recent Labs Lab 05/23/15 1903 05/24/15 0705  INR 1.43 1.43   Cardiac Enzymes:  Recent Labs Lab 05/24/15 0006 05/24/15 0540 05/24/15 1107  TROPONINI <0.03 <0.03 <0.03   BNP (last 3 results) No results for input(s): PROBNP in the last 8760 hours. HbA1C: No results for input(s): HGBA1C in the last 72 hours. CBG:  Recent Labs Lab 05/23/15 1903  GLUCAP 102*   Lipid Profile:  Recent Labs  05/24/15 1344  CHOL 134   Thyroid Function Tests: No results for input(s): TSH, T4TOTAL, FREET4, T3FREE, THYROIDAB in the last 72 hours. Anemia Panel: No results for input(s): VITAMINB12, FOLATE, FERRITIN, TIBC, IRON, RETICCTPCT in the last 72 hours. Urine analysis:    Component Value Date/Time   COLORURINE AMBER* 05/23/2015 1941   APPEARANCEUR CLEAR 05/23/2015 1941   LABSPEC 1.021 05/23/2015 1941   PHURINE 6.5 05/23/2015 1941   GLUCOSEU NEGATIVE 05/23/2015 1941   HGBUR MODERATE* 05/23/2015 1941   BILIRUBINUR NEGATIVE 05/23/2015 1941   KETONESUR NEGATIVE 05/23/2015 1941   PROTEINUR 30* 05/23/2015 1941   UROBILINOGEN 0.2 10/29/2013 1932   NITRITE NEGATIVE 05/23/2015 1941   LEUKOCYTESUR NEGATIVE 05/23/2015 1941   Sepsis Labs: @LABRCNTIP (procalcitonin:4,lacticidven:4)  ) Recent Results (from the past 240 hour(s))  Urine culture     Status: None   Collection Time: 05/23/15  7:41 PM  Result Value Ref Range Status   Specimen Description URINE, CATHETERIZED  Final   Special Requests NONE  Final   Culture   Final    NO GROWTH 1 DAY Performed at [facility]    Report Status 05/25/2015 FINAL  Final  Body fluid culture     Status: None (Preliminary result)   Collection Time: 05/24/15 12:20 PM  Result Value Ref Range Status   Specimen Description PLEURAL  Final   Special Requests Normal  Final   Gram Stain   Final    ABUNDANT WBC PRESENT,BOTH PMN AND MONONUCLEAR NO ORGANISMS  SEEN Performed at [facility]    Culture PENDING  Incomplete   Report Status PENDING  Incomplete      Studies: No results found.  Scheduled Meds: . ceFEPime (MAXIPIME) IV  1 g Intravenous Q8H  . cholecalciferol  2,000 Units Oral Daily  . feeding supplement (ENSURE ENLIVE)  237 mL Oral BID BM  . folic acid  2 mg Oral Daily  . hydrocortisone sod succinate (SOLU-CORTEF) inj  50 mg Intravenous BID  . metoprolol succinate  25 mg Oral QPM  . metoprolol succinate  50 mg Oral Daily  . multivitamin with minerals  1 tablet Oral Daily  . pantoprazole  40 mg Oral Daily  . pravastatin  20 mg Oral QPM  . vancomycin  500 mg Intravenous Q12H    Continuous Infusions: . sodium chloride 10 mL/hr at 05/24/15 0014     LOS: 2 days   Time spent: 15  minutes  Annita Brod, MD [facility] Pager [phone]  If 7PM-7AM, please contact night-coverage [email or web address] Password [email or web address] 05/25/2015, 2:13 PM

## 2015-05-25 NOTE — Progress Notes (Signed)
[facility]  Chart reviewed, CT images reviewed: given that we were only able to get about 670cc rusty colored fluid with mostly neutrophils will ask TCTS to see as this will most likely need either VATS or chest tube drainage.  Roselie Awkward, MD [facility] Pager: [phone] Cell: [phone] After 3pm or if no response, call [phone]

## 2015-05-25 NOTE — Procedures (Signed)
Interventional Radiology Procedure Note  Procedure: US guided left pleural drain.  27F pigtail catheter.  Sero-sanguinous fluid draining to atrium. .  Complications: None Recommendations:  - Maintain to drainage - Do not submerge  - Record daily output   Signed,  Dulcy Fanny. Earleen Newport, DO

## 2015-05-25 NOTE — Care Management Important Message (Signed)
Important Message  Patient Details  Name: [name] MRN: [id number] Date of Birth: 1940-07-19   Medicare Important Message Given:   yes    [name], RN 05/25/2015, 12:16 PM

## 2015-05-25 NOTE — Progress Notes (Signed)
Patient ID: [name], female   DOB: 1940-03-06, 75 y.o.   MRN: [id number] Request noted for placement of a left chest drain secondary to a suspected loculated pleural effusion causing dyspnea.  Patient has been seen and consented for the procedure this afternoon.  -Risks and Benefits discussed with the patient including bleeding, infection, damage to adjacent structures, and sepsis. All of the patient's questions were answered, patient is agreeable to proceed. Consent signed and in chart.  Deyci Gesell E 1:47 PM 05/25/2015

## 2015-05-25 NOTE — Progress Notes (Signed)
Post Chest Tube placement report called to Shanon Brow, RN.  Pt to be transported for 1 view CXR and transported back to her room in bed.

## 2015-05-26 DIAGNOSIS — Z9689 Presence of other specified functional implants: Secondary | ICD-10-CM | POA: Insufficient documentation

## 2015-05-26 DIAGNOSIS — Z789 Other specified health status: Secondary | ICD-10-CM

## 2015-05-26 NOTE — Progress Notes (Signed)
Patient ID: [name], female   DOB: 07/19/1940, 75 y.o.   MRN: [id number]    Referring Physician(s): Dr. Simonne Maffucci  Supervising Physician: Aletta Edouard  Chief Complaint: Left loculated pleural effusion  Subjective: Patient feeling better today.  Oxygen has been weaned off.  She is able to speak with more breath than before.  Allergies: Arthrotec; Erythromycin; Morphine sulfate; Gold-containing drug products; Macrodantin; Naproxen; and Penicillins  Medications: Prior to Admission medications   Medication Sig Start Date End Date Taking? Authorizing Provider  acidophilus (RISAQUAD) CAPS capsule Take 1 capsule by mouth daily.   Yes Historical Provider, MD  cholecalciferol (VITAMIN D) 1000 UNITS tablet Take 2,000 Units by mouth daily.   Yes Historical Provider, MD  Etanercept (ENBREL [place]) Take one (1) injection subcutaneous every Friday.   Yes Historical Provider, MD  fluticasone (FLONASE) 50 MCG/ACT nasal spray Place 2 sprays into both nostrils daily as needed for allergies or rhinitis.    Yes Historical Provider, MD  folic acid (FOLVITE) 1 MG tablet Take 2 mg by mouth daily.    Yes Historical Provider, MD  furosemide (LASIX) 40 MG tablet Take 20 mg by mouth daily.    Yes Historical Provider, MD  hydroxypropyl methylcellulose / hypromellose (ISOPTO TEARS / GONIOVISC) 2.5 % ophthalmic solution Place 1 drop into both eyes 4 (four) times daily as needed for dry eyes.   Yes Historical Provider, MD  methotrexate (RHEUMATREX) 2.5 MG tablet Take three (3) tablets (7.5 mg total) by mouth every Tuesday; Take two (2) tablets (5 mg total) by mouth every Wednesday. 08/29/14  Yes Historical Provider, MD  metoprolol succinate (TOPROL-XL) 25 MG 24 hr tablet Take 1 tablet (25 mg total) by mouth every evening. 05/18/15  Yes Jettie Booze, MD  metoprolol succinate (TOPROL-XL) 50 MG 24 hr tablet Take 1 tablet (50 mg total) by mouth daily. Take with or immediately following a meal. 05/18/15   Yes Jettie Booze, MD  Multiple Vitamins-Minerals (MULTIVITAMIN WITH MINERALS) tablet Take 1 tablet by mouth daily.   Yes Historical Provider, MD  omeprazole (PRILOSEC) 20 MG capsule Take 20 mg by mouth every morning.    Yes Historical Provider, MD  pravastatin (PRAVACHOL) 20 MG tablet TAKE 1 TABLET EVERY EVENING 06/11/14  Yes Jettie Booze, MD  predniSONE (DELTASONE) 5 MG tablet Take 2.5 mg by mouth daily with breakfast.    Yes Historical Provider, MD  traMADol (ULTRAM) 50 MG tablet Take 1 tablet (50 mg total) by mouth every 6 (six) hours as needed for severe pain. 08/06/14  Yes Velvet Bathe, MD  warfarin (COUMADIN) 5 MG tablet TAKE 1 TABLET BY MOUTH DAILY OR AS DIRECTED Patient taking differently: TAKE 1 TABLET (5 MG) DAILY EXCEPT HALF A TABLET (2.5 MG) ON THURSDAYS 03/08/15  Yes Jettie Booze, MD    Vital Signs: BP 108/72 mmHg  Pulse 58  Temp(Src) 97.5 F (36.4 C) (Oral)  Resp 20  Ht 5\' 3"  (1.6 m)  Wt 146 lb (66.225 kg)  BMI 25.87 kg/m2  SpO2 98%  Physical Exam: Lungs: CTAB, drain site is c/d/i.  Drain with about 110cc of serosang output since placement yesterday.  No evidence of air leak  Imaging: Dg Chest 1 View  05/25/2015  CLINICAL DATA:  Chest tube, history hypertension, atrial fibrillation EXAM: CHEST 1 VIEW COMPARISON:  Portable exam 1716 hours compared to 05/24/2015 FINDINGS: New small caliber pigtail LEFT thoracostomy tube identified at lateral LEFT lung base. Enlargement of cardiac silhouette. Mediastinal contours and pulmonary  vascularity normal. Bronchitic changes without acute infiltrate or failure. Decrease in small LEFT pleural effusion since previous exam. No pneumothorax. Bones demineralized. IMPRESSION: No pneumothorax following LEFT thoracostomy tube. Enlargement of cardiac silhouette. Minimal bronchitic changes. Electronically Signed   By: Lavonia Dana M.D.   On: 05/25/2015 17:42   Dg Chest 2 View  05/23/2015  CLINICAL DATA:  Cough and fever EXAM: CHEST   2 VIEW COMPARISON:  April 27, 2015 FINDINGS: No pneumothorax. Stable cardiomegaly. Increasing effusion and opacity in the left base. Mild pulmonary venous congestion. No other acute abnormalities. IMPRESSION: Increasing effusion and opacity in the left base. Recommend follow-up to resolution. Mild pulmonary venous congestion. Electronically Signed   By: Dorise Bullion III M.D   On: 05/23/2015 19:06   Ct Head Wo Contrast  05/23/2015  CLINICAL DATA:  Altered mental status. EXAM: CT HEAD WITHOUT CONTRAST TECHNIQUE: Contiguous axial images were obtained from the base of the skull through the vertex without intravenous contrast. COMPARISON:  None. FINDINGS: There is no intracranial hemorrhage, mass or evidence of acute infarction. There is mild generalized atrophy. There is mild chronic microvascular ischemic change. There is no significant extra-axial fluid collection. No acute intracranial findings are evident. The calvarium and skullbase are intact. Visible paranasal sinuses and orbits are unremarkable. IMPRESSION: No acute intracranial findings. There is mild atrophy and chronic appearing white matter hypodensity, likely due to small vessel disease. Electronically Signed   By: Andreas Newport M.D.   On: 05/23/2015 21:31   Ct Chest Wo Contrast  05/24/2015  CLINICAL DATA:  History of atrial fibrillation, left side chest pain images of the thoracic inlet are unremarkable. Images of the thoracic inlet are unremarkable. Central airways are patent. Or EXAM: CT CHEST WITHOUT CONTRAST TECHNIQUE: Multidetector CT imaging of the chest was performed following the standard protocol without IV contrast. COMPARISON:  CT of the chest 05/23/2015 and CT of the chest 08/03/2014 FINDINGS: Images of the thoracic inlet are unremarkable. Central airways are patent. Heart size is stable. Small hiatal hernia again noted. There is no mediastinal or hilar adenopathy noted on this unenhanced scan. Again noted moderate size left pleural  effusion. Stable consolidation with air bronchogram in left lower lobe posteriorly. This may be due to atelectasis or pneumonia. No definite mass is noted on this unenhanced scan. Again obstructive mass cannot be excluded. Follow-up to resolution is recommended. Persistent small infiltrate or atelectasis in lingula with some air bronchogram. Stable small left loculated diaphragmatic pleural effusion posteriorly. Trace pericardial effusion. The right lung is clear.  No pulmonary edema. Atherosclerotic calcifications of thoracic aorta again noted. No adrenal gland mass is noted in visualized upper abdomen. The visualized unenhanced pancreas is unremarkable. The visualized upper kidneys and liver are unremarkable. Visualized spleen is unremarkable. Sagittal images of the spine shows no destructive bony lesions. Stable degenerative changes thoracic spine. Sagittal view of the sternum is unremarkable. No destructive rib lesions are noted. IMPRESSION: 1. Again noted moderate left pleural effusion with left lower lobe posterior atelectasis or infiltrate. Underlying mass cannot be entirely excluded. Follow-up to resolution is recommended. Stable atelectasis or infiltrate in lingula with some air bronchogram. 2. No pulmonary edema. 3. No mediastinal or hilar adenopathy.  Small hiatal hernia. 4. No adrenal gland mass is noted. 5. Degenerative changes thoracic spine. Electronically Signed   By: Lahoma Crocker M.D.   On: 05/24/2015 14:00   Ct Chest Wo Contrast  05/23/2015  CLINICAL DATA:  75 year old female with abnormal chest x-ray. Evaluate for pleural effusion.  EXAM: CT CHEST WITHOUT CONTRAST TECHNIQUE: Multidetector CT imaging of the chest was performed following the standard protocol without IV contrast. COMPARISON:  Chest radiograph dated 05/23/2015 and CT dated 08/03/2014 FINDINGS: Evaluation of this exam is limited in the absence of intravenous contrast. Evaluation is also limited due to respiratory motion artifact. There  is a moderate size left pleural effusion with mass effect and partial compressive atelectasis of the left lower lobe. Superimposed pneumonia or underlying obstructing mass are not excluded. There is also a small area of consolidative changes with air bronchogram involving the lingula. The right lung is clear. There is no pleural effusion on the right. There is no pneumothorax. The central airways are patent. There is minimal atherosclerotic calcification of the thoracic aorta. The central pulmonary arteries appear grossly unremarkable on this noncontrast study. Top-normal cardiac size. No pericardial effusion. There is no hilar or mediastinal adenopathy. There is a small hiatal hernia. The thyroid gland is heterogeneous and somewhat atrophic. There is a small linear hypodensity in the right thyroid lobe. Ultrasound may provide better evaluation of the thyroid gland if clinically indicated. There is no axillary adenopathy. The chest wall soft tissues appear unremarkable. There is degenerative changes of the spine. No acute osseous pathology. The visualized upper abdomen appears unremarkable. IMPRESSION: Moderate-sized left pleural effusion with partial compressive atelectasis of the left lower lobe. Electronically Signed   By: Anner Crete M.D.   On: 05/23/2015 22:25   Dg Chest Port 1 View  05/24/2015  CLINICAL DATA:  Status post left thoracentesis today. Postprocedural examination. EXAM: PORTABLE CHEST 1 VIEW COMPARISON:  CT chest and PA and lateral chest 05/23/2015. FINDINGS: Left pleural effusion seen on the comparison studies is decreased in size. There is no pneumothorax. Left basilar airspace disease is noted. The right lung appears clear. IMPRESSION: Decreased left effusion after thoracentesis. Negative for pneumothorax. Electronically Signed   By: Inge Rise M.D.   On: 05/24/2015 12:58   [place] Image Guided Drainage By Percutaneous Catheter  05/25/2015  INDICATION: 75 year old female with a history  of persistent left pleural effusion after previous thoracentesis. EXAM: [place] IMAGE GUIDED DRAINAGE BY PERCUTANEOUS CATHETE MEDICATIONS: The patient is currently admitted to the hospital and receiving intravenous antibiotics. The antibiotics were administered within an appropriate time frame prior to the initiation of the procedure. ANESTHESIA/SEDATION: None None The patient was continuously monitored during the procedure by the interventional radiology nurse under my direct supervision. COMPLICATIONS: None PROCEDURE: Informed written consent was obtained from the patient after a thorough discussion of the procedural risks, benefits and alternatives. All questions were addressed. Maximal Sterile Barrier Technique was utilized including caps, mask, sterile gowns, sterile gloves, sterile drape, hand hygiene and skin antiseptic. A timeout was performed prior to the initiation of the procedure. Patient is position in a seated position on a stretcher in the ultrasound suite, and ultrasound survey of the left posterior thorax was performed with images stored and sent to PACs. The patient was then prepped and draped in usual sterile fashion using chlorhexidine prep. The skin and subcutaneous tissues were then generously infiltrated 1% lidocaine for local anesthesia. A small stab incision was made with an 11 blade scalpel, and then trocar technique was used to advance a 10 [place] drain into left pleural effusion. Catheter was advanced off the trocar, pigtail was formed, and aspiration of serosanguineous fluid was achieved. Catheter was sutured in position and attached to a atrium water seal for drainage. Final ultrasound image was stored. Sterile dressing was applied. The  patient tolerated the procedure well and remained hemodynamically stable throughout. No complications were encountered and no significant blood loss. IMPRESSION: Status post ultrasound-guided left pleural drainage with 10 French pigtail drain catheter placed,  and attached to water seal chamber. Signed, Dulcy Fanny. Earleen Newport, DO [facility] Electronically Signed   By: Corrie Mckusick D.O.   On: 05/25/2015 17:40    Labs:  CBC:  Recent Labs  04/23/15 2205 04/24/15 0445 05/23/15 1903 05/24/15 0540  WBC 8.7 10.0 7.6 7.6  HGB 12.8 11.9* 13.5 13.2  HCT 38.8 38.2 41.5 39.0  PLT 214 199 237 224    COAGS:  Recent Labs  04/27/15 0245 05/18/15 0955 05/23/15 1903 05/24/15 0705  INR 2.68* 2.1 1.43 1.43    BMP:  Recent Labs  04/23/15 2205 04/24/15 0445 05/23/15 1903 05/24/15 0540  NA 142 142 136 138  K 2.8* 3.6 3.5 4.0  CL 104 106 100* 101  CO2 28 25 26 26   GLUCOSE 156* 119* 108* 163*  BUN 14 11 11 11   CALCIUM 9.5 8.9 8.9 9.1  CREATININE 0.97 0.75 0.73 0.81  GFRNONAA 57* >60 >60 >60  GFRAA >60 >60 >60 >60    LIVER FUNCTION TESTS:  Recent Labs  04/24/15 0445 05/23/15 1903 05/24/15 0540 05/24/15 1230  BILITOT 0.8 0.8 1.0  --   AST 37 21 20  --   ALT 48 20 20  --   ALKPHOS 59 65 65  --   PROT 6.2* 6.6 7.1 6.5  ALBUMIN 3.4* 3.7 3.5  --     Assessment and Plan: 1. S/p US guided left chest drain, [place], 5/9 -site looks good -cont with drain in place for now -pulmonary likely wants this in for a couple of days, then hopefully this can be pulled. -original labs on her fluid were negative for infection or malignancy -cont to follow  Electronically Signed: Leverne Amrhein E 05/26/2015, 2:53 PM   I spent a total of 15 Minutes at the the patient's bedside AND on the patient's hospital floor or unit, greater than 50% of which was counseling/coordinating care for left loculated pleural effusion

## 2015-05-26 NOTE — Progress Notes (Signed)
Pharmacy Antibiotic Note  [name] is a 75 y.o. female w/ hx of RA on Enbrel, MTX and prednisone admitted on 05/23/2015 with c/o SOB and AMS. Chest CT of 5/7 showed moderate sized left pleural effusion w/ partial compression and atelectasis of LLL Pt is now on Vancomycin 500 mg and Cefepime 1g q8h. Of note, pt does have PCN allergy but has tolerated ceftriaxone in the past.  Pharmacy has been consulted for vancomycin dosing. Pt underwent thoracentesis on 5/8 which resulted in 700 mL of rust colored fluid removed. Pt had chest tube placed on 5/9. Per Pulmonary current ABX regimen will be continued.  Plan:  Continue Vancomycin 500 mg q12h Continue Cefepime 1g q8h Obtain Vancomycin trough level prior to 5/11 AM dose to assess current regimen and accumulation. Will adjust course if needed based on these findings. Follow pt for renal function and monitor clinical course  Height: 5\' 3"  (160 cm) Weight: 146 lb (66.225 kg) IBW/kg (Calculated) : 52.4  Temp (24hrs), Avg:97.9 F (36.6 C), Min:97.3 F (36.3 C), Max:98.8 F (37.1 C)   Recent Labs Lab 05/23/15 1903 05/23/15 1922 05/23/15 2223 05/24/15 0540  WBC 7.6  --   --  7.6  CREATININE 0.73  --   --  0.81  LATICACIDVEN  --  1.29 0.90  --     Estimated Creatinine Clearance: 56.5 mL/min (by C-G formula based on Cr of 0.81).    Allergies  Allergen Reactions  . Arthrotec [Diclofenac-Misoprostol] Diarrhea  . Erythromycin     Anaphylaxis   . Morphine Sulfate Other (See Comments)    Erratic behavior; hallucinations  . Gold-Containing Drug Products Rash  . Macrodantin [Nitrofurantoin Macrocrystal] Rash  . Naproxen Rash  . Penicillins Rash    amoxicillin    Antimicrobials this admission: Aztreonam 5/7 >> 5/8 Vancomycin 5/7 >> Cefepime 5/8 >>  Dose adjustments this admission: N/A  Microbiology results: 5/7 BCx: No 48h growth noted 5/7 UCx: No 24h growth noted  5/8 Pleural effusion fluid: No 48h growth noted  5/8 Sputum:  Results pending   Thank you for allowing pharmacy to be a part of this patient's care.  Cheral Almas 05/26/2015 2:40 PM

## 2015-05-26 NOTE — Progress Notes (Signed)
PULMONARY / CRITICAL CARE MEDICINE   Name: [name] MRN: [id number] DOB: September 30, 1940    ADMISSION DATE:  05/23/2015 CONSULTATION DATE:  05/24/2015   REFERRING MD:  [name]  CHIEF COMPLAINT:  Dyspnea  HISTORY OF PRESENT ILLNESS:   75 year old female with a past medical history significant for rheumatoid arthritis was admitted on 05/23/2015 from the [facility] emergency department in the setting of progressive confusion and shortness of breath. She was noted to have a left-sided pleural effusion. Pulmonary and critical care medicine was consulted. Of note, the patient has atrial fibrillation takes warfarin.  She was recently admitted to [facility] where she had increasing shortness of breath, atrial fibrillation, and an echocardiogram that showed evidence of pulmonary hypertension. She was treated with rate control, warfarin, and some diuresis. During that hospital visit pulmonary medicine was consulted and recommended continued treatment for A. fib with follow-up in the outpatient pulmonary clinic.  She tells me that for the last several weeks she's been steadily worsening in terms of shortness of breath. She says that she become short of breath with any movement, talking, or cough. She notes a left-sided sharp chest pain which is worse with deep breaths and coughs. She is not coughing up mucus, she denies fevers or chills. She states that she has been compliant with her medical therapy including Enbrel and methotrexate which she says has been giving very good control over her rheumatoid arthritis recently.     SUBJECTIVE:  Feels much better   VITAL SIGNS: BP 108/72 mmHg  Pulse 58  Temp(Src) 97.5 F (36.4 C) (Oral)  Resp 20  Ht 5\' 3"  (1.6 m)  Wt 146 lb (66.225 kg)  BMI 25.87 kg/m2  SpO2 98%  HEMODYNAMICS:    VENTILATOR SETTINGS:    INTAKE / OUTPUT: I/O last 3 completed shifts: In: 618.7 [P.O.:120; I.V.:248.7; IV Piggyback:250] Out: 360 [Urine:250;  Chest Tube:110]  PHYSICAL EXAMINATION: General:  Pleasant female, sitting up in bed, breathing more comfortably  Neuro:  Awake, alert, oriented 4, moves all 4 extremities HEENT:  Normocephalic/atraumatic, oropharynx clear, extraocular movements intact, sclera anicteric Cardiovascular:  Irregularly irregular rhythm, tachycardic, no murmur Lungs:  Crackles left base, no accessory use. WOB looks much improved.  Abdomen:  Bowel sounds positive, nontender nondistended Musculoskeletal:  Normal bulk and tone Skin:  No bruising or rash  LABS:  BMET  Recent Labs Lab 05/23/15 1903 05/24/15 0540  NA 136 138  K 3.5 4.0  CL 100* 101  CO2 26 26  BUN 11 11  CREATININE 0.73 0.81  GLUCOSE 108* 163*    Electrolytes  Recent Labs Lab 05/23/15 1903 05/24/15 0540  CALCIUM 8.9 9.1    CBC  Recent Labs Lab 05/23/15 1903 05/24/15 0540  WBC 7.6 7.6  HGB 13.5 13.2  HCT 41.5 39.0  PLT 237 224    Coag's  Recent Labs Lab 05/23/15 1903 05/24/15 0705  INR 1.43 1.43    Sepsis Markers  Recent Labs Lab 05/23/15 1922 05/23/15 2223  LATICACIDVEN 1.29 0.90    ABG No results for input(s): PHART, PCO2ART, PO2ART in the last 168 hours.  Liver Enzymes  Recent Labs Lab 05/23/15 1903 05/24/15 0540  AST 21 20  ALT 20 20  ALKPHOS 65 65  BILITOT 0.8 1.0  ALBUMIN 3.7 3.5    Cardiac Enzymes  Recent Labs Lab 05/24/15 0006 05/24/15 0540 05/24/15 1107  TROPONINI <0.03 <0.03 <0.03    Glucose  Recent Labs Lab 05/23/15 1903  GLUCAP 102*  Imaging  05/23/2015 CT chest images personally reviewed showing a moderate sized pleural effusion with compressive atelectasis of the left lower lobe. Questionably there is a few patches of consolidation in the lingula. 05/23/2015 CT head no acute intracranial process.  STUDIES:    CULTURES: 5/7 Blood >  5/7 urine >  5/8 pleural fluid>>>  ANTIBIOTICS: 5/7 vanc >  5/7 cefepime >   SIGNIFICANT EVENTS: Left thoracentesis  5/8:   Culture; abundant WBC>>>  Cholesterol>>>,protein (fluid): 4.2, LDH (serum)161, protein (serum) 6.5, Glucose (fluid): 115, LDH (fluid) 313, color: red, cloudy, wbc: 9058. Cytology: negative.       LINES/TUBES:   DISCUSSION: This is a 75 year old female with a past medical history significant for rheumatoid arthritis who presented to [facility] hospital on 05/24/2015 with a new left-sided pleural effusion in the setting of confusion. Complicated left pleural effusion. Favor parapneumonic.  underlying rheumatoid arthritis less likely; cytology was negative for malignancy.  ASSESSMENT / PLAN:   Complicated parapneumonic left Pleural effusion Dyspnea-->improved.  P:   Continue CT drainage.  Repeat CXR am 5/11 F/u culture data No change in abx   Atrial fibrillation P: Continue rate control Hold warfarin for now   FAMILY  - Updates: none bedside  Erick Colace ACNP-BC [place] Pager # [phone] OR # [phone] if no answer    05/26/2015, 10:36 AM

## 2015-05-26 NOTE — Progress Notes (Addendum)
PROGRESS NOTE  Nicole BAUWENS [id number] DOB: July 26, 1940 DOA: 05/23/2015 PCP: Lottie Dawson, MD  HPI/Recap of past 67 hours: 75 year old female with past medical history of chronic atrial fibrillation, rheumatoid arthritis and hypertension admitted on the night of 5/7 after several days of increased confusion. Patient had also been increasingly short of breath with complaints of left-sided pleuritic chest pain. Emergency room, lab work unremarkable and CT of the head was as well. CT scan of the chest noted moderate pleural effusion and patient noted to be hypoxic.  Patient started on oxygen, IV antibiotics and pulmonary consulted.  With oxygenation, patient's mentation improved. Seen by pulmonary and patient underwent left-sided thoracentesis yielding a normal 700 mL's of bloody rust colored fluid.  Pulmonary felt that still sig amount of fluid despite tap concerning for empyema.  Patient S/P US guided left pleural drain, pigtail catheter by IR 5-09   Assessment/Plan:  Acute respiratory failure with hypoxia ([place]) secondary to left-sided exudative pleural effusion, presumed infectious-concerns for empyema.   Continue antibiotics. Awaiting culture results. Pulmonary following Patient S/P US guided left pleural drain, pigtail catheter by IR 5-09.  Plan to repeat Chest x ray tomorrow,  Cytology negative for malignancy, pleural fluid no growth to date.   A fib;  Resume coumadin when ok by CCm.  On metoprolol./   Hypertension: Blood pressure stable  Rheumatoid arthritis (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 On stress dose steroids Acute encephalopathy: Secondary to hypoxia. Improving with oxygenation Atrial fibrillation: Borderline tachycardic given hypoxia. Coumadin on hold for thoracentesis,   Code Status: Full code   Family Communication: patient   Disposition Plan: Likely will be here until the weekend   Consultants:  Pulmonary    CVTS  Procedures:  Status post thoracentesis of left lung done 5/8:649ml of fluid removed    Chest Tube planned 5/9  Antimicrobials:   IV Azactam 5/71 dose  Cefepime 5/8-present  Vancomycin 5/8-present  DVT prophylaxis: SCDs, Coumadin on hold-continue to hold per pulmonary    Objective: Filed Vitals:   05/25/15 1720 05/25/15 1757 05/25/15 2050 05/26/15 0527  BP: 95/69 106/67 90/59 108/72  Pulse:  91 85 58  Temp:  97.3 F (36.3 C) 98.8 F (37.1 C) 97.5 F (36.4 C)  TempSrc:   Oral Oral  Resp: 24 20 20 20   Height:      Weight:      SpO2: 100% 97% 99% 98%    Intake/Output Summary (Last 24 hours) at 05/26/15 1139 Last data filed at 05/26/15 0948  Gross per 24 hour  Intake    300 ml  Output    260 ml  Net     40 ml   Filed Weights   05/23/15 1931 05/23/15 2256  Weight: 66.225 kg (146 lb) 66.225 kg (146 lb)    Exam:   General:  Alert and oriented 3,  Cardiovascular: Irregular rhythm, borderline tachycardia   Respiratory: BL crackles, chest drain  in place.   Abdomen: Soft, nontender, nondistended, positive bowel sounds   Musculoskeletal: No clubbing or cyanosis or edema   Skin: No skin breaks, tears or lesions  Psychiatry: Patient is appropriate, no evidence of psychoses    Data Reviewed: CBC:  Recent Labs Lab 05/23/15 1903 05/24/15 0540  WBC 7.6 7.6  NEUTROABS 5.1  --   HGB 13.5 13.2  HCT 41.5 39.0  MCV 92.4 90.7  PLT 237 [id number]   Basic Metabolic Panel:  Recent Labs Lab 05/23/15 1903 05/24/15 0540  NA 136  138  K 3.5 4.0  CL 100* 101  CO2 26 26  GLUCOSE 108* 163*  BUN 11 11  CREATININE 0.73 0.81  CALCIUM 8.9 9.1   GFR: Estimated Creatinine Clearance: 56.5 mL/min (by C-G formula based on Cr of 0.81). Liver Function Tests:  Recent Labs Lab 05/23/15 1903 05/24/15 0540 05/24/15 1230  AST 21 20  --   ALT 20 20  --   ALKPHOS 65 65  --   BILITOT 0.8 1.0  --   PROT 6.6 7.1 6.5  ALBUMIN 3.7 3.5  --    No results for  input(s): LIPASE, AMYLASE in the last 168 hours.  Recent Labs Lab 05/24/15 0006  AMMONIA 12   Coagulation Profile:  Recent Labs Lab 05/23/15 1903 05/24/15 0705  INR 1.43 1.43   Cardiac Enzymes:  Recent Labs Lab 05/24/15 0006 05/24/15 0540 05/24/15 1107  TROPONINI <0.03 <0.03 <0.03   BNP (last 3 results) No results for input(s): PROBNP in the last 8760 hours. HbA1C: No results for input(s): HGBA1C in the last 72 hours. CBG:  Recent Labs Lab 05/23/15 1903  GLUCAP 102*   Lipid Profile:  Recent Labs  05/24/15 1344  CHOL 134   Thyroid Function Tests: No results for input(s): TSH, T4TOTAL, FREET4, T3FREE, THYROIDAB in the last 72 hours. Anemia Panel: No results for input(s): VITAMINB12, FOLATE, FERRITIN, TIBC, IRON, RETICCTPCT in the last 72 hours. Urine analysis:    Component Value Date/Time   COLORURINE AMBER* 05/23/2015 1941   APPEARANCEUR CLEAR 05/23/2015 1941   LABSPEC 1.021 05/23/2015 1941   PHURINE 6.5 05/23/2015 1941   GLUCOSEU NEGATIVE 05/23/2015 1941   HGBUR MODERATE* 05/23/2015 1941   BILIRUBINUR NEGATIVE 05/23/2015 [place] NEGATIVE 05/23/2015 1941   PROTEINUR 30* 05/23/2015 1941   UROBILINOGEN 0.2 10/29/2013 1932   NITRITE NEGATIVE 05/23/2015 1941   LEUKOCYTESUR NEGATIVE 05/23/2015 1941   Sepsis Labs: @LABRCNTIP (procalcitonin:4,lacticidven:4)  ) Recent Results (from the past 240 hour(s))  Culture, blood (Routine x 2)     Status: None (Preliminary result)   Collection Time: 05/23/15  7:08 PM  Result Value Ref Range Status   Specimen Description BLOOD LEFT FOREARM  Final   Special Requests BOTTLES DRAWN AEROBIC AND ANAEROBIC 10CC EA  Final   Culture   Final    NO GROWTH 1 DAY Performed at [facility]    Report Status PENDING  Incomplete  Urine culture     Status: None   Collection Time: 05/23/15  7:41 PM  Result Value Ref Range Status   Specimen Description URINE, CATHETERIZED  Final   Special Requests NONE  Final    Culture   Final    NO GROWTH 1 DAY Performed at [facility]    Report Status 05/25/2015 FINAL  Final  Body fluid culture     Status: None (Preliminary result)   Collection Time: 05/24/15 12:20 PM  Result Value Ref Range Status   Specimen Description PLEURAL  Final   Special Requests Normal  Final   Gram Stain   Final    ABUNDANT WBC PRESENT,BOTH PMN AND MONONUCLEAR NO ORGANISMS SEEN    Culture   Final    NO GROWTH 2 DAYS Performed at [facility]    Report Status PENDING  Incomplete      Studies: Dg Chest 1 View  05/25/2015  CLINICAL DATA:  Chest tube, history hypertension, atrial fibrillation EXAM: CHEST 1 VIEW COMPARISON:  Portable exam [id number] hours compared to 05/24/2015 FINDINGS: New  small caliber pigtail LEFT thoracostomy tube identified at lateral LEFT lung base. Enlargement of cardiac silhouette. Mediastinal contours and pulmonary vascularity normal. Bronchitic changes without acute infiltrate or failure. Decrease in small LEFT pleural effusion since previous exam. No pneumothorax. Bones demineralized. IMPRESSION: No pneumothorax following LEFT thoracostomy tube. Enlargement of cardiac silhouette. Minimal bronchitic changes. Electronically Signed   By: Lavonia Dana M.D.   On: 05/25/2015 17:42   [place] Image Guided Drainage By Percutaneous Catheter  05/25/2015  INDICATION: 75 year old female with a history of persistent left pleural effusion after previous thoracentesis. EXAM: [place] IMAGE GUIDED DRAINAGE BY PERCUTANEOUS CATHETE MEDICATIONS: The patient is currently admitted to the hospital and receiving intravenous antibiotics. The antibiotics were administered within an appropriate time frame prior to the initiation of the procedure. ANESTHESIA/SEDATION: None None The patient was continuously monitored during the procedure by the interventional radiology nurse under my direct supervision. COMPLICATIONS: None PROCEDURE: Informed written consent was obtained from the patient  after a thorough discussion of the procedural risks, benefits and alternatives. All questions were addressed. Maximal Sterile Barrier Technique was utilized including caps, mask, sterile gowns, sterile gloves, sterile drape, hand hygiene and skin antiseptic. A timeout was performed prior to the initiation of the procedure. Patient is position in a seated position on a stretcher in the ultrasound suite, and ultrasound survey of the left posterior thorax was performed with images stored and sent to PACs. The patient was then prepped and draped in usual sterile fashion using chlorhexidine prep. The skin and subcutaneous tissues were then generously infiltrated 1% lidocaine for local anesthesia. A small stab incision was made with an 11 blade scalpel, and then trocar technique was used to advance a 10 [place] drain into left pleural effusion. Catheter was advanced off the trocar, pigtail was formed, and aspiration of serosanguineous fluid was achieved. Catheter was sutured in position and attached to a atrium water seal for drainage. Final ultrasound image was stored. Sterile dressing was applied. The patient tolerated the procedure well and remained hemodynamically stable throughout. No complications were encountered and no significant blood loss. IMPRESSION: Status post ultrasound-guided left pleural drainage with 10 French pigtail drain catheter placed, and attached to water seal chamber. Signed, Dulcy Fanny. Earleen Newport, DO [facility] Electronically Signed   By: Corrie Mckusick D.O.   On: 05/25/2015 17:40    Scheduled Meds: . ceFEPime (MAXIPIME) IV  1 g Intravenous Q8H  . cholecalciferol  2,000 Units Oral Daily  . feeding supplement (ENSURE ENLIVE)  237 mL Oral BID BM  . folic acid  2 mg Oral Daily  . hydrocortisone sod succinate (SOLU-CORTEF) inj  50 mg Intravenous BID  . metoprolol succinate  25 mg Oral QPM  . metoprolol succinate  50 mg Oral Daily  .  multivitamin with minerals  1 tablet Oral Daily  . pantoprazole  40 mg Oral Daily  . pravastatin  20 mg Oral QPM  . vancomycin  500 mg Intravenous Q12H    Continuous Infusions: . sodium chloride 10 mL/hr at 05/24/15 0014     LOS: 3 days   Time spent: 25 minutes  Elmarie Shiley, MD [facility] Pager [phone]  If 7PM-7AM, please contact night-coverage [email or web address] Password [email or web address] 05/26/2015, 11:39 AM

## 2015-05-27 ENCOUNTER — Inpatient Hospital Stay (HOSPITAL_COMMUNITY): Payer: Medicare Other

## 2015-05-27 DIAGNOSIS — J9601 Acute respiratory failure with hypoxia: Secondary | ICD-10-CM

## 2015-05-27 LAB — BASIC METABOLIC PANEL
ANION GAP: 10 (ref 5–15)
BUN: 17 mg/dL (ref 6–20)
CALCIUM: 9 mg/dL (ref 8.9–10.3)
CO2: 26 mmol/L (ref 22–32)
CREATININE: 0.59 mg/dL (ref 0.44–1.00)
Chloride: 109 mmol/L (ref 101–111)
Glucose, Bld: 146 mg/dL — ABNORMAL HIGH (ref 65–99)
Potassium: 4.1 mmol/L (ref 3.5–5.1)
Sodium: 145 mmol/L (ref 135–145)

## 2015-05-27 LAB — CBC
HCT: 35 % — ABNORMAL LOW (ref 36.0–46.0)
Hemoglobin: 11.4 g/dL — ABNORMAL LOW (ref 12.0–15.0)
MCH: 30.4 pg (ref 26.0–34.0)
MCHC: 32.6 g/dL (ref 30.0–36.0)
MCV: 93.3 fL (ref 78.0–100.0)
PLATELETS: 272 10*3/uL (ref 150–400)
RBC: 3.75 MIL/uL — ABNORMAL LOW (ref 3.87–5.11)
RDW: 15.7 % — AB (ref 11.5–15.5)
WBC: 8.2 10*3/uL (ref 4.0–10.5)

## 2015-05-27 LAB — RHEUMATOID FACTORS, FLUID

## 2015-05-27 LAB — VANCOMYCIN, TROUGH: Vancomycin Tr: 7 ug/mL — ABNORMAL LOW (ref 10.0–20.0)

## 2015-05-27 LAB — ADENOSIDE DEAMINASE, PLEURAL FL: ADENOSIDE DEAMINASE, PLEURAL FL: 4.4 U/L (ref 0.0–9.4)

## 2015-05-27 NOTE — Progress Notes (Signed)
PULMONARY / CRITICAL CARE MEDICINE   Name: CODIE WERNECKE MRN: [id number] DOB: 18-Apr-1940    ADMISSION DATE:  05/23/2015 CONSULTATION DATE:  05/24/2015   REFERRING MD:  [name]  CHIEF COMPLAINT:  Dyspnea  BRIEF:   75 y/o female with a parapneumonic effusion.    SUBJECTIVE:  Feels better, still mild dyspnea  VITAL SIGNS: BP 110/90 mmHg  Pulse 83  Temp(Src) 97.6 F (36.4 C) (Oral)  Resp 20  Ht 5\' 3"  (1.6 m)  Wt 146 lb (66.225 kg)  BMI 25.87 kg/m2  SpO2 99%  HEMODYNAMICS:    VENTILATOR SETTINGS:    INTAKE / OUTPUT: I/O last 3 completed shifts: In: 47 [P.O.:780] Out: 660 [Urine:650; Chest Tube:10]  PHYSICAL EXAMINATION: General:  Pleasant female, sitting up in bed, breathing comfortably  Neuro:  Awake, alert, oriented 4, moves all 4 extremities  HEENT:  Normocephalic/atraumatic, oropharynx clear,  Cardiovascular:  Irregularly irregular rhythm, tachycardic, no murmur Lungs:  Diminished left base, chest drain in place, normal effort, clear otherwise Abdomen:  Bowel sounds positive, nontender nondistended Musculoskeletal:  Normal bulk and tone Skin:  No bruising or rash  LABS:  BMET  Recent Labs Lab 05/23/15 1903 05/24/15 0540 05/27/15 0521  NA 136 138 145  K 3.5 4.0 4.1  CL 100* 101 109  CO2 26 26 26   BUN 11 11 17   CREATININE 0.73 0.81 0.59  GLUCOSE 108* 163* 146*    Electrolytes  Recent Labs Lab 05/23/15 1903 05/24/15 0540 05/27/15 0521  CALCIUM 8.9 9.1 9.0    CBC  Recent Labs Lab 05/23/15 1903 05/24/15 0540 05/27/15 0521  WBC 7.6 7.6 8.2  HGB 13.5 13.2 11.4*  HCT 41.5 39.0 35.0*  PLT 237 224 272    Coag's  Recent Labs Lab 05/23/15 1903 05/24/15 0705  INR 1.43 1.43    Sepsis Markers  Recent Labs Lab 05/23/15 1922 05/23/15 2223  LATICACIDVEN 1.29 0.90    ABG No results for input(s): PHART, PCO2ART, PO2ART in the last 168 hours.  Liver Enzymes  Recent Labs Lab 05/23/15 1903 05/24/15 0540  AST 21 20  ALT  20 20  ALKPHOS 65 65  BILITOT 0.8 1.0  ALBUMIN 3.7 3.5    Cardiac Enzymes  Recent Labs Lab 05/24/15 0006 05/24/15 0540 05/24/15 1107  TROPONINI <0.03 <0.03 <0.03    Glucose  Recent Labs Lab 05/23/15 1903  GLUCAP 102*    Imaging  05/23/2015 CT chest images personally reviewed showing a moderate sized pleural effusion with compressive atelectasis of the left lower lobe. Questionably there is a few patches of consolidation in the lingula. 05/23/2015 CT head no acute intracranial process 5/11 CXR images personally reviewed> persistent pleural fluid left base   STUDIES:    CULTURES: 5/7 Blood >  5/7 urine >  5/8 pleural fluid>>>  ANTIBIOTICS: 5/7 vanc >  5/7 cefepime >   SIGNIFICANT EVENTS: Left thoracentesis 5/8:   Culture; abundant WBC>>> negative  Cholesterol>>>,protein (fluid): 4.2, LDH (serum)161, protein (serum) 6.5, Glucose (fluid): 115, LDH (fluid) 313, color: red, cloudy, wbc: 9058. Cytology: negative.     LINES/TUBES: 5/9 Chest drain left chest >   DISCUSSION: This is a 75 year old female with a past medical history significant for rheumatoid arthritis who presented to [facility] on 05/24/2015 with a new left-sided pleural effusion in the setting of confusion. This a complicated left parapneumonic pleural effusion.  Her dyspnea has improved but there is still pleural fluid in the left pleural space as seen on her CXR which  is relatively small.  If she is able to ambulate without difficulty then it is likely that we will not help her by performing more drainage attempts.     ASSESSMENT / PLAN:   Complicated parapneumonic left Pleural effusion Dyspnea-->improved HCAP P:   Chest drain to water seal Ambulate Likely d/c chest drain on 5/11 Repeat CXR 5/12 F/u culture data Stop vanc, continue cefepime, consider switch to augmentin tomorrow  Atrial fibrillation P: Continue rate control OK to restart warfarin on 5/12 if no further procedure  needed   FAMILY  - Updates: none bedside 5/11  Roselie Awkward, MD [place] [facility] Pager: [phone] Cell: [phone] After 3pm or if no response, call [phone]    05/27/2015, 11:18 AM

## 2015-05-27 NOTE — Care Management Note (Signed)
Case Management Note  Patient Details  Name: [name] MRN: [id number] Date of Birth: 1941-01-04  Subjective/Objective:       75 yo admitted with Acute Respiratory Failure             Action/Plan: From home with spouse.  Per MD note, chest drain to potentially be dc'd today.  CM will continue to follow.  Expected Discharge Date:  05/27/15               Expected Discharge Plan:  Home/Self Care  In-House Referral:     Discharge planning Services  CM Consult  Post Acute Care Choice:    Choice offered to:     DME Arranged:    DME Agency:     HH Arranged:    HH Agency:     Status of Service:  In process, will continue to follow  Medicare Important Message Given:  Yes Date Medicare IM Given:    Medicare IM give by:    Date Additional Medicare IM Given:    Additional Medicare Important Message give by:     If discussed at [place] of Stay Meetings, dates discussed:    Additional CommentsLynnell Catalan, RN 05/27/2015, 3:10 PM  [phone]

## 2015-05-27 NOTE — Progress Notes (Signed)
PROGRESS NOTE  [name] [id number] DOB: [birth year] DOA: 05/23/2015 PCP: Lottie Dawson, MD  HPI/Recap of past 53 hours: 75 year old female with past medical history of chronic atrial fibrillation, rheumatoid arthritis and hypertension admitted on the night of 5/7 after several days of increased confusion. Patient had also been increasingly short of breath with complaints of left-sided pleuritic chest pain. Emergency room, lab work unremarkable and CT of the head was as well. CT scan of the chest noted moderate pleural effusion and patient noted to be hypoxic.  Patient started on oxygen, IV antibiotics and pulmonary consulted.  With oxygenation, patient's mentation improved. Seen by pulmonary and patient underwent left-sided thoracentesis yielding a normal 700 mL's of bloody rust colored fluid.  Pulmonary felt that still sig amount of fluid despite tap concerning for empyema.  Patient S/P US guided left pleural drain, pigtail catheter by IR 5-09  Patient is feeling better today. Dyspnea has improved   Assessment/Plan:  Acute respiratory failure with hypoxia (HCC) secondary to left-sided exudative pleural effusion, presumed infectious-concerns for empyema.   Continue antibiotics. Awaiting culture results. Pulmonary following Patient S/P US guided left pleural drain, pigtail catheter by IR 5-09.  Plan to repeat Chest x ray tomorrow,  Cytology negative for malignancy, pleural fluid no growth to date.  Chest x ray 5-11 stable.  Vancomycin dc by CCM. Plan to transition to Augmentin tomorrow.  Possible DC drain today   A fib;  Plan to resume coumadin tomorrow.  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 On stress dose steroids Acute encephalopathy: Secondary to hypoxia. Improving with oxygenation    Code Status: Full code   Family Communication: patient   Disposition Plan: Likely will  be here until the weekend   Consultants:  Pulmonary   CVTS  Procedures:  Status post thoracentesis of left lung done 5/8:643ml of fluid removed    Chest Tube planned 5/9  Antimicrobials:   IV Azactam 5/71 dose  Cefepime 5/8-present  Vancomycin 5/8-present  DVT prophylaxis: SCDs, Coumadin on hold-continue to hold per pulmonary    Objective: Filed Vitals:   05/26/15 0527 05/26/15 1457 05/26/15 2237 05/27/15 0528  BP: 108/72 99/62 117/64 110/90  Pulse: 58 83 79 83  Temp: 97.5 F (36.4 C) 98.1 F (36.7 C) 98.1 F (36.7 C) 97.6 F (36.4 C)  TempSrc: Oral Oral Oral Oral  Resp: 20 18 18 20   Height:      Weight:      SpO2: 98% 97% 99% 99%    Intake/Output Summary (Last 24 hours) at 05/27/15 1446 Last data filed at 05/27/15 1030  Gross per 24 hour  Intake    630 ml  Output    300 ml  Net    330 ml   Filed Weights   05/23/15 1931 05/23/15 2256  Weight: 66.225 kg (146 lb) 66.225 kg (146 lb)    Exam:   General:  Alert and oriented 3,  Cardiovascular: Irregular rhythm, borderline tachycardia   Respiratory: BL crackles, chest drain  in place.   Abdomen: Soft, nontender, nondistended, positive bowel sounds   Musculoskeletal: No clubbing or cyanosis or edema   Skin: No skin breaks, tears or lesions  Data Reviewed: CBC:  Recent Labs Lab 05/23/15 1903 05/24/15 0540 05/27/15 0521  WBC 7.6 7.6 8.2  NEUTROABS 5.1  --   --   HGB 13.5 13.2 11.4*  HCT 41.5 39.0 35.0*  MCV 92.4 90.7 93.3  PLT 237 224 [id number]   Basic Metabolic Panel:  Recent Labs Lab 05/23/15 1903 05/24/15 0540 05/27/15 0521  NA 136 138 145  K 3.5 4.0 4.1  CL 100* 101 109  CO2 26 26 26   GLUCOSE 108* 163* 146*  BUN 11 11 17   CREATININE 0.73 0.81 0.59  CALCIUM 8.9 9.1 9.0   GFR: Estimated Creatinine Clearance: 57.2 mL/min (by C-G formula based on Cr of 0.59). Liver Function Tests:  Recent Labs Lab 05/23/15 1903 05/24/15 0540 05/24/15 1230  AST 21 20  --   ALT 20 20  --    ALKPHOS 65 65  --   BILITOT 0.8 1.0  --   PROT 6.6 7.1 6.5  ALBUMIN 3.7 3.5  --    No results for input(s): LIPASE, AMYLASE in the last 168 hours.  Recent Labs Lab 05/24/15 0006  AMMONIA 12   Coagulation Profile:  Recent Labs Lab 05/23/15 1903 05/24/15 0705  INR 1.43 1.43   Cardiac Enzymes:  Recent Labs Lab 05/24/15 0006 05/24/15 0540 05/24/15 1107  TROPONINI <0.03 <0.03 <0.03   BNP (last 3 results) No results for input(s): PROBNP in the last 8760 hours. HbA1C: No results for input(s): HGBA1C in the last 72 hours. CBG:  Recent Labs Lab 05/23/15 1903  GLUCAP 102*   Lipid Profile: No results for input(s): CHOL, HDL, LDLCALC, TRIG, CHOLHDL, LDLDIRECT in the last 72 hours. Thyroid Function Tests: No results for input(s): TSH, T4TOTAL, FREET4, T3FREE, THYROIDAB in the last 72 hours. Anemia Panel: No results for input(s): VITAMINB12, FOLATE, FERRITIN, TIBC, IRON, RETICCTPCT in the last 72 hours. Urine analysis:    Component Value Date/Time   COLORURINE AMBER* 05/23/2015 1941   APPEARANCEUR CLEAR 05/23/2015 1941   LABSPEC 1.021 05/23/2015 1941   PHURINE 6.5 05/23/2015 1941   GLUCOSEU NEGATIVE 05/23/2015 1941   HGBUR MODERATE* 05/23/2015 1941   BILIRUBINUR NEGATIVE 05/23/2015 [place] NEGATIVE 05/23/2015 1941   PROTEINUR 30* 05/23/2015 1941   UROBILINOGEN 0.2 10/29/2013 1932   NITRITE NEGATIVE 05/23/2015 1941   LEUKOCYTESUR NEGATIVE 05/23/2015 1941   Sepsis Labs: @LABRCNTIP (procalcitonin:4,lacticidven:4)  ) Recent Results (from the past 240 hour(s))  Culture, blood (Routine x 2)     Status: None (Preliminary result)   Collection Time: 05/23/15  7:08 PM  Result Value Ref Range Status   Specimen Description BLOOD LEFT FOREARM  Final   Special Requests BOTTLES DRAWN AEROBIC AND ANAEROBIC 10CC EA  Final   Culture   Final    NO GROWTH 2 DAYS Performed at [facility]    Report Status PENDING  Incomplete  Urine culture     Status: None     Collection Time: 05/23/15  7:41 PM  Result Value Ref Range Status   Specimen Description URINE, CATHETERIZED  Final   Special Requests NONE  Final   Culture   Final    NO GROWTH 1 DAY Performed at [facility]    Report Status 05/25/2015 FINAL  Final  Body fluid culture     Status: None (Preliminary result)   Collection Time: 05/24/15 12:20 PM  Result Value Ref Range Status   Specimen Description PLEURAL  Final   Special Requests Normal  Final   Gram Stain   Final    ABUNDANT WBC PRESENT,BOTH PMN AND MONONUCLEAR NO ORGANISMS SEEN    Culture   Final    NO GROWTH 3 DAYS Performed at [facility]    Report Status PENDING  Incomplete  Studies: Dg Chest 2 View  05/27/2015  CLINICAL DATA:  Severe shortness of breath, pneumonia, chest tube placement for excluded pleural effusion EXAM: CHEST  2 VIEW COMPARISON:  05/25/2015 FINDINGS: Small to moderate left pleural effusion. Left pigtail catheter. No pneumothorax. Cardiomegaly. IMPRESSION: Small moderate left pleural effusion.  Left pigtail catheter. Electronically Signed   By: Julian Hy M.D.   On: 05/27/2015 10:04    Scheduled Meds: . ceFEPime (MAXIPIME) IV  1 g Intravenous Q8H  . cholecalciferol  2,000 Units Oral Daily  . feeding supplement (ENSURE ENLIVE)  237 mL Oral BID BM  . folic acid  2 mg Oral Daily  . hydrocortisone sod succinate (SOLU-CORTEF) inj  50 mg Intravenous BID  . metoprolol succinate  25 mg Oral QPM  . metoprolol succinate  50 mg Oral Daily  . multivitamin with minerals  1 tablet Oral Daily  . pantoprazole  40 mg Oral Daily  . pravastatin  20 mg Oral QPM    Continuous Infusions: . sodium chloride 10 mL/hr at 05/24/15 0014     LOS: 4 days   Time spent: 25 minutes  Elmarie Shiley, MD [facility] Pager [phone]  If 7PM-7AM, please contact night-coverage [email or web address] Password [facility] 05/27/2015, 2:46 PM

## 2015-05-27 NOTE — Care Management Important Message (Signed)
Important Message  Patient Details  Name: [name] MRN: [id number] Date of Birth: 1940-01-28   Medicare Important Message Given:  Yes    Camillo Flaming 05/27/2015, 10:23 AMImportant Message  Patient Details  Name: [name] MRN: [id number] Date of Birth: Sep 30, 1940   Medicare Important Message Given:  Yes    Camillo Flaming 05/27/2015, 10:23 AM

## 2015-05-28 ENCOUNTER — Inpatient Hospital Stay (HOSPITAL_COMMUNITY): Payer: Medicare Other

## 2015-05-28 DIAGNOSIS — I481 Persistent atrial fibrillation: Secondary | ICD-10-CM

## 2015-05-28 LAB — TROPONIN I

## 2015-05-28 LAB — BODY FLUID CULTURE
Culture: NO GROWTH
Special Requests: NORMAL

## 2015-05-28 LAB — CHOLESTEROL, BODY FLUID: CHOL FL: 93 mg/dL

## 2015-05-28 MED ORDER — METOPROLOL SUCCINATE ER 50 MG PO TB24
50.0000 mg | ORAL_TABLET | Freq: Every evening | ORAL | Status: DC
  Administered 2015-05-28 – 2015-05-31 (×4): 50 mg via ORAL
  Filled 2015-05-28 (×6): qty 1

## 2015-05-28 MED ORDER — METOPROLOL TARTRATE 5 MG/5ML IV SOLN
5.0000 mg | Freq: Four times a day (QID) | INTRAVENOUS | Status: DC | PRN
  Administered 2015-05-28: 5 mg via INTRAVENOUS
  Filled 2015-05-28: qty 5

## 2015-05-28 MED ORDER — CEFUROXIME AXETIL 500 MG PO TABS
500.0000 mg | ORAL_TABLET | Freq: Two times a day (BID) | ORAL | Status: DC
  Administered 2015-05-28 – 2015-06-01 (×8): 500 mg via ORAL
  Filled 2015-05-28 (×11): qty 1

## 2015-05-28 NOTE — Progress Notes (Signed)
Patient ID: [name], female   DOB: 1940/12/04, 75 y.o.   MRN: [id number]    Referring Physician(s): [id number]  Supervising Physician: Aletta Edouard  Patient Status: In-pt  Chief Complaint:  Left loculated pleural effusion  Subjective:  Pt feeling a little better now; dyspnea has decreased as well as HR- received IV lopressor ; some soreness at drain site Allergies: Arthrotec; Erythromycin; Morphine sulfate; Gold-containing drug products; Macrodantin; Naproxen; and Penicillins  Medications: Prior to Admission medications   Medication Sig Start Date End Date Taking? Authorizing Provider  acidophilus (RISAQUAD) CAPS capsule Take 1 capsule by mouth daily.   Yes Historical Provider, MD  cholecalciferol (VITAMIN D) 1000 UNITS tablet Take 2,000 Units by mouth daily.   Yes Historical Provider, MD  Etanercept (ENBREL [place]) Take one (1) injection subcutaneous every Friday.   Yes Historical Provider, MD  fluticasone (FLONASE) 50 MCG/ACT nasal spray Place 2 sprays into both nostrils daily as needed for allergies or rhinitis.    Yes Historical Provider, MD  folic acid (FOLVITE) 1 MG tablet Take 2 mg by mouth daily.    Yes Historical Provider, MD  furosemide (LASIX) 40 MG tablet Take 20 mg by mouth daily.    Yes Historical Provider, MD  hydroxypropyl methylcellulose / hypromellose (ISOPTO TEARS / GONIOVISC) 2.5 % ophthalmic solution Place 1 drop into both eyes 4 (four) times daily as needed for dry eyes.   Yes Historical Provider, MD  methotrexate (RHEUMATREX) 2.5 MG tablet Take three (3) tablets (7.5 mg total) by mouth every Tuesday; Take two (2) tablets (5 mg total) by mouth every Wednesday. 08/29/14  Yes Historical Provider, MD  metoprolol succinate (TOPROL-XL) 25 MG 24 hr tablet Take 1 tablet (25 mg total) by mouth every evening. 05/18/15  Yes Jettie Booze, MD  metoprolol succinate (TOPROL-XL) 50 MG 24 hr tablet Take 1 tablet (50 mg total) by mouth daily. Take with or  immediately following a meal. 05/18/15  Yes Jettie Booze, MD  Multiple Vitamins-Minerals (MULTIVITAMIN WITH MINERALS) tablet Take 1 tablet by mouth daily.   Yes Historical Provider, MD  omeprazole (PRILOSEC) 20 MG capsule Take 20 mg by mouth every morning.    Yes Historical Provider, MD  pravastatin (PRAVACHOL) 20 MG tablet TAKE 1 TABLET EVERY EVENING 06/11/14  Yes Jettie Booze, MD  predniSONE (DELTASONE) 5 MG tablet Take 2.5 mg by mouth daily with breakfast.    Yes Historical Provider, MD  traMADol (ULTRAM) 50 MG tablet Take 1 tablet (50 mg total) by mouth every 6 (six) hours as needed for severe pain. 08/06/14  Yes Velvet Bathe, MD  warfarin (COUMADIN) 5 MG tablet TAKE 1 TABLET BY MOUTH DAILY OR AS DIRECTED Patient taking differently: TAKE 1 TABLET (5 MG) DAILY EXCEPT HALF A TABLET (2.5 MG) ON THURSDAYS 03/08/15  Yes Jettie Booze, MD     Vital Signs: BP 117/74 mmHg  Pulse 81  Temp(Src) 98.4 F (36.9 C) (Oral)  Resp 18  Ht 5\' 3"  (1.6 m)  Wt 146 lb (66.225 kg)  BMI 25.87 kg/m2  SpO2 97%  Physical Exam awake/alert; left chest drain intact, insertion site ok but suture is no longer  in skin; minimal output noted; cx's/cyt neg  Imaging: Dg Chest 1 View  05/25/2015  CLINICAL DATA:  Chest tube, history hypertension, atrial fibrillation EXAM: CHEST 1 VIEW COMPARISON:  Portable exam 1716 hours compared to 05/24/2015 FINDINGS: New small caliber pigtail LEFT thoracostomy tube identified at lateral LEFT lung base. Enlargement of cardiac silhouette. Mediastinal  contours and pulmonary vascularity normal. Bronchitic changes without acute infiltrate or failure. Decrease in small LEFT pleural effusion since previous exam. No pneumothorax. Bones demineralized. IMPRESSION: No pneumothorax following LEFT thoracostomy tube. Enlargement of cardiac silhouette. Minimal bronchitic changes. Electronically Signed   By: Lavonia Dana M.D.   On: 05/25/2015 17:42   Dg Chest 2 View  05/28/2015  CLINICAL  DATA:  LEFT pleural effusion, atrial fibrillation, hypertension, shortness of breath at times EXAM: CHEST  2 VIEW COMPARISON:  05/27/2015 FINDINGS: Pigtail LEFT thoracostomy tube unchanged. Enlargement of cardiac silhouette. Mediastinal contours and pulmonary vascular normal. Persistent LEFT lung base density which could represent partially loculated pleural effusion. Coexistent atelectasis LEFT lower lobe. Remaining lungs clear. Central peribronchial thickening. No pneumothorax. IMPRESSION: Persistent LEFT basilar opacity question loculated pleural effusion atelectasis no underlying consolidation and mass cannot be excluded with this appearance. Recommend continued followup until resolution. Electronically Signed   By: Lavonia Dana M.D.   On: 05/28/2015 09:40   Dg Chest 2 View  05/27/2015  CLINICAL DATA:  Severe shortness of breath, pneumonia, chest tube placement for excluded pleural effusion EXAM: CHEST  2 VIEW COMPARISON:  05/25/2015 FINDINGS: Small to moderate left pleural effusion. Left pigtail catheter. No pneumothorax. Cardiomegaly. IMPRESSION: Small moderate left pleural effusion.  Left pigtail catheter. Electronically Signed   By: Julian Hy M.D.   On: 05/27/2015 10:04   [place] Image Guided Drainage By Percutaneous Catheter  05/25/2015  INDICATION: 75 year old female with a history of persistent left pleural effusion after previous thoracentesis. EXAM: [place] IMAGE GUIDED DRAINAGE BY PERCUTANEOUS CATHETE MEDICATIONS: The patient is currently admitted to the hospital and receiving intravenous antibiotics. The antibiotics were administered within an appropriate time frame prior to the initiation of the procedure. ANESTHESIA/SEDATION: None None The patient was continuously monitored during the procedure by the interventional radiology nurse under my direct supervision. COMPLICATIONS: None PROCEDURE: Informed written consent was obtained from the patient after a thorough discussion of the procedural risks,  benefits and alternatives. All questions were addressed. Maximal Sterile Barrier Technique was utilized including caps, mask, sterile gowns, sterile gloves, sterile drape, hand hygiene and skin antiseptic. A timeout was performed prior to the initiation of the procedure. Patient is position in a seated position on a stretcher in the ultrasound suite, and ultrasound survey of the left posterior thorax was performed with images stored and sent to PACs. The patient was then prepped and draped in usual sterile fashion using chlorhexidine prep. The skin and subcutaneous tissues were then generously infiltrated 1% lidocaine for local anesthesia. A small stab incision was made with an 11 blade scalpel, and then trocar technique was used to advance a 10 [place] drain into left pleural effusion. Catheter was advanced off the trocar, pigtail was formed, and aspiration of serosanguineous fluid was achieved. Catheter was sutured in position and attached to a atrium water seal for drainage. Final ultrasound image was stored. Sterile dressing was applied. The patient tolerated the procedure well and remained hemodynamically stable throughout. No complications were encountered and no significant blood loss. IMPRESSION: Status post ultrasound-guided left pleural drainage with 10 French pigtail drain catheter placed, and attached to water seal chamber. Signed, Dulcy Fanny. Earleen Newport, DO [facility] Electronically Signed   By: Corrie Mckusick D.O.   On: 05/25/2015 17:40    Labs:  CBC:  Recent Labs  04/24/15 0445 05/23/15 1903 05/24/15 0540 05/27/15 0521  WBC 10.0 7.6 7.6 8.2  HGB 11.9* 13.5 13.2 11.4*  HCT 38.2  41.5 39.0 35.0*  PLT 199 237 224 272    COAGS:  Recent Labs  04/27/15 0245 05/18/15 0955 05/23/15 1903 05/24/15 0705  INR 2.68* 2.1 1.43 1.43    BMP:  Recent Labs  04/24/15 0445 05/23/15 1903 05/24/15 0540 05/27/15 0521  NA 142 136 138 145    K 3.6 3.5 4.0 4.1  CL 106 100* 101 109  CO2 25 26 26 26   GLUCOSE 119* 108* 163* 146*  BUN 11 11 11 17   CALCIUM 8.9 8.9 9.1 9.0  CREATININE 0.75 0.73 0.81 0.59  GFRNONAA >60 >60 >60 >60  GFRAA >60 >60 >60 >60    LIVER FUNCTION TESTS:  Recent Labs  04/24/15 0445 05/23/15 1903 05/24/15 0540 05/24/15 1230  BILITOT 0.8 0.8 1.0  --   AST 37 21 20  --   ALT 48 20 20  --   ALKPHOS 59 65 65  --   PROT 6.2* 6.6 7.1 6.5  ALBUMIN 3.4* 3.7 3.5  --     Assessment and Plan: S/p left chest drain placement for loculated parapneumonic effusion 5/9; cx's/cyt neg, minimal output noted; request received for drain removal; drain removed in its entirety and as removed only 15 additional cc's bloody fluid could be aspirated; vaseline gauze dressing applied to site. F/u CXR pending.    Electronically Signed: D. Rowe Robert 05/28/2015, 3:29 PM   I spent a total of 15 minutes at the the patient's bedside AND on the patient's hospital floor or unit, greater than 50% of which was counseling/coordinating care for left chest drain removal

## 2015-05-28 NOTE — Progress Notes (Signed)
PULMONARY / CRITICAL CARE MEDICINE   Name: [name] MRN: [id number] DOB: 09-04-1940    ADMISSION DATE:  05/23/2015 CONSULTATION DATE:  05/24/2015   REFERRING MD:  [name]  CHIEF COMPLAINT:  Dyspnea  BRIEF:   75 y/o female with a parapneumonic effusion.    SUBJECTIVE:  Feels better, still mild dyspnea and Chest pain left back  VITAL SIGNS: BP 98/56 mmHg  Pulse 77  Temp(Src) 97.5 F (36.4 C) (Oral)  Resp 20  Ht 5\' 3"  (1.6 m)  Wt 146 lb (66.225 kg)  BMI 25.87 kg/m2  SpO2 97%  HEMODYNAMICS:    VENTILATOR SETTINGS:    INTAKE / OUTPUT: I/O last 3 completed shifts: In: 28 [P.O.:480; IV Piggyback:150] Out: -   PHYSICAL EXAMINATION: General:  Pleasant female, sitting up in bed, breathing comfortably  Neuro:  Awake, alert, oriented 4, moves all 4 extremities  HEENT:  Normocephalic/atraumatic, oropharynx clear,  Cardiovascular:  Irregularly irregular rhythm, tachycardic, no murmur Lungs:  Diminished left base, chest drain in place, normal effort, clear otherwise Abdomen:  Bowel sounds positive, nontender nondistended Musculoskeletal:  Normal bulk and tone Skin:  No bruising or rash  LABS:  BMET  Recent Labs Lab 05/23/15 1903 05/24/15 0540 05/27/15 0521  NA 136 138 145  K 3.5 4.0 4.1  CL 100* 101 109  CO2 26 26 26   BUN 11 11 17   CREATININE 0.73 0.81 0.59  GLUCOSE 108* 163* 146*    Electrolytes  Recent Labs Lab 05/23/15 1903 05/24/15 0540 05/27/15 0521  CALCIUM 8.9 9.1 9.0    CBC  Recent Labs Lab 05/23/15 1903 05/24/15 0540 05/27/15 0521  WBC 7.6 7.6 8.2  HGB 13.5 13.2 11.4*  HCT 41.5 39.0 35.0*  PLT 237 224 272    Coag's  Recent Labs Lab 05/23/15 1903 05/24/15 0705  INR 1.43 1.43    Sepsis Markers  Recent Labs Lab 05/23/15 1922 05/23/15 2223  LATICACIDVEN 1.29 0.90    ABG No results for input(s): PHART, PCO2ART, PO2ART in the last 168 hours.  Liver Enzymes  Recent Labs Lab 05/23/15 1903 05/24/15 0540   AST 21 20  ALT 20 20  ALKPHOS 65 65  BILITOT 0.8 1.0  ALBUMIN 3.7 3.5    Cardiac Enzymes  Recent Labs Lab 05/24/15 0006 05/24/15 0540 05/24/15 1107  TROPONINI <0.03 <0.03 <0.03    Glucose  Recent Labs Lab 05/23/15 1903  GLUCAP 102*    Imaging  05/23/2015 CT chest images personally reviewed showing a moderate sized pleural effusion with compressive atelectasis of the left lower lobe. Questionably there is a few patches of consolidation in the lingula. 05/23/2015 CT head no acute intracranial process 5/11 CXR images personally reviewed> persistent pleural fluid left base   STUDIES:    CULTURES: 5/7 Blood >  5/7 urine >  5/8 pleural fluid>>> 5/12: pleural fluid>>>  ANTIBIOTICS: 5/7 vanc >  5/7 cefepime >   SIGNIFICANT EVENTS: Left thoracentesis 5/8:   Culture; abundant WBC>>> negative  Cholesterol>>>,protein (fluid): 4.2, LDH (serum)161, protein (serum) 6.5, Glucose (fluid): 115, LDH (fluid) 313, color: red, cloudy, wbc: 9058. Cytology: negative.  Left repeat thora 5/12: 100 ml serous    LINES/TUBES: 5/9 Chest drain left chest >   DISCUSSION: This is a 75 year old female with a past medical history significant for rheumatoid arthritis who presented to [facility] hospital on 05/24/2015 with a new left-sided pleural effusion in the setting of confusion. This a complicated left parapneumonic pleural effusion.  Her dyspnea has improved but there  is still pleural fluid in the left pleural space. We were able to drain 100 ml out w/ thora. We then pulled the CT back 6 cm but still haven't completely drained the pleural space. We just drained another 100 ml via bedside thora. We will have IR come and remove the CT as it is not draining the fluid space. Will have them pull it slow in effort to hopefully get the rest of it when it travels thru the fluid collection. Still not sure that the fluid is responsible for her dyspnea. She did have significant consolidation. Will cont  supportive care.   ASSESSMENT / PLAN:   Complicated parapneumonic left Pleural effusion Dyspnea-->improved HCAP P:   Will have IR pull tube; we have spoken w/ them-->seems like the CT port extends past the fluid collection. Will try to pull it back slowly in effort to drain the remaining fluid as we pull out the tube Ambulate Likely d/c chest drain on 5/11 Repeat CXR 5/12 1400 F/u culture data consider switch to augmentin   Atrial fibrillation P: Continue rate control OK to restart warfarin on 5/12 if no further procedure needed   FAMILY  - Updates: none bedside 5/11  Erick Colace ACNP-BC Tensed Pager # [phone] OR # [phone] if no answer     05/28/2015, 10:53 AM

## 2015-05-28 NOTE — Evaluation (Signed)
Physical Therapy Evaluation Patient Details Name: [name] MRN: [id number] DOB: 08-31-1940 Today's Date: 05/28/2015   History of Present Illness  61 y0 femail admitted through ED 05/23/15 with AMS, CP and SOB.  Pt dx wtih acute resp failure with hypoxia 2* pleural effusion.  Pt s/p multiple thoracnetesis including this am.  Pt with hx of RA and A-fib  Clinical Impression  Pt admitted as above and presenting with functional mobility limitations 2* generalized weakness, SOB with exertion and mild ambulatory balance deficits.  Pt should progress to dc home with family assist.    Follow Up Recommendations No PT follow up    Equipment Recommendations  None recommended by PT    Recommendations for Other Services       Precautions / Restrictions Precautions Precautions: Fall Restrictions Weight Bearing Restrictions: No      Mobility  Bed Mobility Overal bed mobility: Modified Independent             General bed mobility comments: Pt to/from EOB unassisted  Transfers Overall transfer level: Needs assistance Equipment used: None Transfers: Sit to/from Stand Sit to Stand: Min assist         General transfer comment: cues for safe transition position, use of UEs to self assist.  Min assist    Ambulation/Gait Ambulation/Gait assistance: Min assist Ambulation Distance (Feet): 150 Feet (twice) Assistive device: 1 person hand held assist Gait Pattern/deviations: Step-through pattern;Decreased step length - right;Decreased step length - left;Shuffle;Trunk flexed Gait velocity: decr Gait velocity interpretation: Below normal speed for age/gender General Gait Details: Mild general instability with multiple standing rests 2* SOB  Stairs            Wheelchair Mobility    Modified Rankin (Stroke Patients Only)       Balance Overall balance assessment: Needs assistance Sitting-balance support: No upper extremity supported;Feet supported Sitting balance-Leahy  Scale: Good     Standing balance support: No upper extremity supported Standing balance-Leahy Scale: Fair                               Pertinent Vitals/Pain      Home Living Family/patient expects to be discharged to:: Private residence Living Arrangements: Spouse/significant other Available Help at Discharge: Family Type of Home: House Home Access: Stairs to enter Entrance Stairs-Rails: None Entrance Stairs-Number of Steps: 1 Home Layout: One level Home Equipment: None      Prior Function Level of Independence: Independent         Comments: 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  She is a retired Customer service manager        Extremity/Trunk Assessment   Upper Extremity Assessment: Generalized weakness           Lower Extremity Assessment: Generalized weakness         Communication   Communication: No difficulties  Cognition Arousal/Alertness: Awake/alert Behavior During Therapy: WFL for tasks assessed/performed Overall Cognitive Status: Within Functional Limits for tasks assessed                      General Comments      Exercises        Assessment/Plan    PT Assessment Patient needs continued PT services  PT Diagnosis Difficulty walking   PT Problem List Decreased strength;Decreased activity tolerance;Decreased balance;Decreased mobility;Decreased knowledge of use of DME  PT Treatment Interventions  DME instruction;Gait training;Stair training;Functional mobility training;Therapeutic activities;Therapeutic exercise;Balance training;Patient/family education   PT Goals (Current goals can be found in the Care Plan section) Acute Rehab PT Goals Patient Stated Goal: Regain IND and return home PT Goal Formulation: With patient Time For Goal Achievement: 06/11/15 Potential to Achieve Goals: Good    Frequency Min 3X/week   Barriers to discharge        Co-evaluation               End of Session    Activity Tolerance: Patient limited by fatigue Patient left: in bed;with call bell/phone within reach Nurse Communication: Mobility status         Time: 1130-1151 PT Time Calculation (min) (ACUTE ONLY): 21 min   Charges:   PT Evaluation $PT Eval Low Complexity: 1 Procedure     PT G Codes:        Kenzi Bardwell Jun 05, 2015, 4:18 PM

## 2015-05-28 NOTE — Procedures (Signed)
Thoracentesis Procedure Note  Pre-operative Diagnosis: left loculated pleural effusion   Post-operative Diagnosis: same  Indications: evaluation of pleural fluid and treatment of dyspnea/chest pain   Procedure Details  Consent: Informed consent was obtained. Risks of the procedure were discussed including: infection, bleeding, pain, pneumothorax.  Under sterile conditions the patient was positioned. Betadine solution and sterile drapes were utilized.  1% buffered lidocaine was used to anesthetize the  pleural space. Fluid was obtained without any difficulties and minimal blood loss.  A dressing was applied to the wound and wound care instructions were provided.   Findings 100 ml of bloody pleural fluid was obtained. A sample was sent to Pathology for cytogenetics, flow, and cell counts, as well as for infection analysis.  Complications:  None; patient tolerated the procedure well.          Condition: stable  Plan A follow up chest x-ray was ordered. Bed Rest for 0 hours. Tylenol 650 mg. for pain.

## 2015-05-28 NOTE — Progress Notes (Addendum)
PROGRESS NOTE  [name] [id number] DOB: December 11, 1940 DOA: 05/23/2015 PCP: Lottie Dawson, MD  HPI/Recap of past 15 hours: 75 year old female with past medical history of chronic atrial fibrillation, rheumatoid arthritis and hypertension admitted on the night of 5/7 after several days of increased confusion. Patient had also been increasingly short of breath with complaints of left-sided pleuritic chest pain. Emergency room, lab work unremarkable and CT of the head was as well. CT scan of the chest noted moderate pleural effusion and patient noted to be hypoxic.  Patient started on oxygen, IV antibiotics and pulmonary consulted.  With oxygenation, patient's mentation improved. Seen by pulmonary and patient underwent left-sided thoracentesis yielding a normal 700 mL's of bloody rust colored fluid.  Pulmonary felt that still sig amount of fluid despite tap concerning for empyema.  Patient S/P US guided left pleural drain, pigtail catheter by IR 5-09  Patient is feeling better today. Dyspnea has improved   Assessment/Plan:  Acute respiratory failure with hypoxia (HCC) secondary to left-sided exudative pleural effusion, presumed infectious-concerns for empyema.   Continue antibiotics. Awaiting culture results. Pulmonary following Patient S/P US guided left pleural drain, pigtail catheter by IR 5-09.  Plan to repeat Chest x ray tomorrow,  Cytology negative for malignancy, pleural fluid no growth to date.  Chest x ray 5-11 stable.  Vancomycin dc by CCM. Plan to transition to Augmentin today  Possible DC drain today , plan to repeat chest x ray this afternoon. CCM following.   A fib; RVR Continue to hold coumadin in case required further procedure.  On metoprolol Hr elevated this am, complaining of dyspnea, thinks is from A fib.  EKG ordered. IV lopressor. If lopressor doesn't help and if BP allows would start Cardizem.  Cardiology consulted.  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 On stress dose steroids Acute encephalopathy: Secondary to hypoxia. Improving with oxygenation    Code Status: Full code   Family Communication: patient   Disposition Plan: Likely will be here over weekend.    Consultants:  Pulmonary   CVTS  Procedures:  Status post thoracentesis of left lung done 5/8:662ml of fluid removed    Chest Tube planned 5/9  Antimicrobials:   IV Azactam 5/71 dose  Cefepime 5/8-present  Vancomycin 5/8-present  DVT prophylaxis: SCDs, Coumadin on hold-continue to hold per pulmonary    Objective: Filed Vitals:   05/27/15 0528 05/27/15 1532 05/27/15 2138 05/28/15 0529  BP: 110/90 122/66 116/64 98/56  Pulse: 83 89 84 77  Temp: 97.6 F (36.4 C) 98 F (36.7 C) 97.7 F (36.5 C) 97.5 F (36.4 C)  TempSrc: Oral Oral Oral Oral  Resp: 20 18 20 20   Height:      Weight:      SpO2: 99% 98% 98% 97%    Intake/Output Summary (Last 24 hours) at 05/28/15 1205 Last data filed at 05/28/15 0900  Gross per 24 hour  Intake    480 ml  Output      0 ml  Net    480 ml   Filed Weights   05/23/15 1931 05/23/15 2256  Weight: 66.225 kg (146 lb) 66.225 kg (146 lb)    Exam:   General:  Alert and oriented 3,  Cardiovascular: Irregular rhythm,  Respiratory: BL crackles, chest drain  in place.   Abdomen: Soft, nontender, nondistended, positive bowel sounds   Musculoskeletal: No clubbing or cyanosis or edema  Skin: No skin breaks, tears or lesions  Data Reviewed: CBC:  Recent Labs Lab 05/23/15 1903 05/24/15 0540 05/27/15 0521  WBC 7.6 7.6 8.2  NEUTROABS 5.1  --   --   HGB 13.5 13.2 11.4*  HCT 41.5 39.0 35.0*  MCV 92.4 90.7 93.3  PLT 237 224 [id number]   Basic Metabolic Panel:  Recent Labs Lab 05/23/15 1903 05/24/15 0540 05/27/15 0521  NA 136 138 145  K 3.5 4.0 4.1  CL 100* 101 109  CO2 26 26 26   GLUCOSE 108*  163* 146*  BUN 11 11 17   CREATININE 0.73 0.81 0.59  CALCIUM 8.9 9.1 9.0   GFR: Estimated Creatinine Clearance: 57.2 mL/min (by C-G formula based on Cr of 0.59). Liver Function Tests:  Recent Labs Lab 05/23/15 1903 05/24/15 0540 05/24/15 1230  AST 21 20  --   ALT 20 20  --   ALKPHOS 65 65  --   BILITOT 0.8 1.0  --   PROT 6.6 7.1 6.5  ALBUMIN 3.7 3.5  --    No results for input(s): LIPASE, AMYLASE in the last 168 hours.  Recent Labs Lab 05/24/15 0006  AMMONIA 12   Coagulation Profile:  Recent Labs Lab 05/23/15 1903 05/24/15 0705  INR 1.43 1.43   Cardiac Enzymes:  Recent Labs Lab 05/24/15 0006 05/24/15 0540 05/24/15 1107  TROPONINI <0.03 <0.03 <0.03   BNP (last 3 results) No results for input(s): PROBNP in the last 8760 hours. HbA1C: No results for input(s): HGBA1C in the last 72 hours. CBG:  Recent Labs Lab 05/23/15 1903  GLUCAP 102*   Lipid Profile: No results for input(s): CHOL, HDL, LDLCALC, TRIG, CHOLHDL, LDLDIRECT in the last 72 hours. Thyroid Function Tests: No results for input(s): TSH, T4TOTAL, FREET4, T3FREE, THYROIDAB in the last 72 hours. Anemia Panel: No results for input(s): VITAMINB12, FOLATE, FERRITIN, TIBC, IRON, RETICCTPCT in the last 72 hours. Urine analysis:    Component Value Date/Time   COLORURINE AMBER* 05/23/2015 1941   APPEARANCEUR CLEAR 05/23/2015 1941   LABSPEC 1.021 05/23/2015 1941   PHURINE 6.5 05/23/2015 1941   GLUCOSEU NEGATIVE 05/23/2015 1941   HGBUR MODERATE* 05/23/2015 1941   BILIRUBINUR NEGATIVE 05/23/2015 1941   KETONESUR NEGATIVE 05/23/2015 1941   PROTEINUR 30* 05/23/2015 1941   UROBILINOGEN 0.2 10/29/2013 1932   NITRITE NEGATIVE 05/23/2015 1941   LEUKOCYTESUR NEGATIVE 05/23/2015 1941   Sepsis Labs: @LABRCNTIP (procalcitonin:4,lacticidven:4)  ) Recent Results (from the past 240 hour(s))  Culture, blood (Routine x 2)     Status: None (Preliminary result)   Collection Time: 05/23/15  7:08 PM  Result  Value Ref Range Status   Specimen Description BLOOD LEFT FOREARM  Final   Special Requests BOTTLES DRAWN AEROBIC AND ANAEROBIC 10CC EA  Final   Culture   Final    NO GROWTH 3 DAYS Performed at [facility]    Report Status PENDING  Incomplete  Urine culture     Status: None   Collection Time: 05/23/15  7:41 PM  Result Value Ref Range Status   Specimen Description URINE, CATHETERIZED  Final   Special Requests NONE  Final   Culture   Final    NO GROWTH 1 DAY Performed at [facility]    Report Status 05/25/2015 FINAL  Final  Body fluid culture     Status: None   Collection Time: 05/24/15 12:20 PM  Result Value Ref Range Status   Specimen Description PLEURAL  Final   Special Requests Normal  Final  Gram Stain   Final    ABUNDANT WBC PRESENT,BOTH PMN AND MONONUCLEAR NO ORGANISMS SEEN    Culture   Final    NO GROWTH 3 DAYS Performed at [facility]    Report Status 05/28/2015 FINAL  Final      Studies: Dg Chest 2 View  05/28/2015  CLINICAL DATA:  LEFT pleural effusion, atrial fibrillation, hypertension, shortness of breath at times EXAM: CHEST  2 VIEW COMPARISON:  05/27/2015 FINDINGS: Pigtail LEFT thoracostomy tube unchanged. Enlargement of cardiac silhouette. Mediastinal contours and pulmonary vascular normal. Persistent LEFT lung base density which could represent partially loculated pleural effusion. Coexistent atelectasis LEFT lower lobe. Remaining lungs clear. Central peribronchial thickening. No pneumothorax. IMPRESSION: Persistent LEFT basilar opacity question loculated pleural effusion atelectasis no underlying consolidation and mass cannot be excluded with this appearance. Recommend continued followup until resolution. Electronically Signed   By: Lavonia Dana M.D.   On: 05/28/2015 09:40    Scheduled Meds: . ceFEPime (MAXIPIME) IV  1 g Intravenous Q8H  . cholecalciferol  2,000 Units Oral Daily  . feeding supplement (ENSURE ENLIVE)  237 mL Oral BID BM   . folic acid  2 mg Oral Daily  . hydrocortisone sod succinate (SOLU-CORTEF) inj  50 mg Intravenous BID  . metoprolol succinate  25 mg Oral QPM  . metoprolol succinate  50 mg Oral Daily  . multivitamin with minerals  1 tablet Oral Daily  . pantoprazole  40 mg Oral Daily  . pravastatin  20 mg Oral QPM    Continuous Infusions: . sodium chloride 10 mL/hr at 05/24/15 0014     LOS: 5 days   Time spent: 25 minutes  Elmarie Shiley, MD [facility] Pager [phone]  If 7PM-7AM, please contact night-coverage [email or web address] Password [facility] 05/28/2015, 12:05 PM

## 2015-05-28 NOTE — Progress Notes (Signed)
Pharmacy Antibiotic Note  [name] is a 75 y.o. female w/ hx of RA on Enbrel, MTX and prednisone admitted on 05/23/2015 with c/o SOB and AMS. Chest CT of 5/7 showed moderate sized left pleural effusion w/ partial compression and atelectasis of LLL Pt is now on Cefepime 1g q8h. Of note, pt does have PCN allergy but has tolerated ceftriaxone in the past.  Pharmacy has been consulted for vancomycin dosing. Pt underwent thoracentesis on 5/8 which resulted in 700 mL of rust colored fluid removed. Pt had chest tube placed on 5/9. Per Pulmonary Vanc d/c, Cefepime continued with suggestion to switch to Augmentin. Of note, patient has a listed allergy to amoxicillin (rash). Suggest using an oral cephalosporin such as cefuroxime or cefpodoxime.  Day 6 of ABX therapy Day 5 of Cefepime therapy  Plan:  Continue Cefepime 1g q8h Switch to Augmentin as per notes when appropriate Follow pt for renal function and monitor clinical course  Height: 5\' 3"  (160 cm) Weight: 146 lb (66.225 kg) IBW/kg (Calculated) : 52.4  Temp (24hrs), Avg:97.7 F (36.5 C), Min:97.5 F (36.4 C), Max:98 F (36.7 C)   Recent Labs Lab 05/23/15 1903 05/23/15 1922 05/23/15 2223 05/24/15 0540 05/27/15 0521 05/27/15 0833  WBC 7.6  --   --  7.6 8.2  --   CREATININE 0.73  --   --  0.81 0.59  --   LATICACIDVEN  --  1.29 0.90  --   --   --   VANCOTROUGH  --   --   --   --   --  7*    Estimated Creatinine Clearance: 57.2 mL/min (by C-G formula based on Cr of 0.59).    Allergies  Allergen Reactions  . Arthrotec [Diclofenac-Misoprostol] Diarrhea  . Erythromycin     Anaphylaxis   . Morphine Sulfate Other (See Comments)    Erratic behavior; hallucinations  . Gold-Containing Drug Products Rash  . Macrodantin [Nitrofurantoin Macrocrystal] Rash  . Naproxen Rash  . Penicillins Rash    amoxicillin    Antimicrobials this admission: Aztreonam 5/7 >> 5/8 Vancomycin 5/7 >> 5/11 Cefepime 5/8 >>  Dose adjustments this  admission: N/A  Microbiology results: 5/7 BCx: No growth noted 5/7 UCx: No growth noted  5/8 Pleural effusion fluid: No growth noted     Thank you for allowing pharmacy to be a part of this patient's care.  Cheral Almas 05/28/2015 10:48 AM

## 2015-05-28 NOTE — Consult Note (Signed)
Cardiologist:  Irish Lack Reason for Consult:  Atrial fib with RVR Referring Physician: Leisel Pinette is an 75 y.o. female.  HPI:   Patient is a 75 year old female with history of chronic atrial fibrillation on Coumadin, hypertension, hyperlipidemia, prolonged QT interval, rheumatoid arthritis and GERD. She had echocardiogram 04/24/2015 revealing ejection fraction 55-60% with normal wall motion. Left atrium was mildly dilated. There was mild MR. The PA pressure was 48 mmHg was a trivial pericardial effusion posteriorly.  Ms. Lesesne presented May 7 with shortness of breath and confusion.  She was found to have a new left-sided loculated pleural effusion.  She underwent ultrasound guided drainage with a percutaneous catheter and chest tube placement.  She is afebrile and on antibiotics.  She reports she was at [facility] for three days in April for CP under left breast, SOB.  Metoprolol was increased for better rate control of Afib.    Her SOB has improved with the removal of fluid.  She still has some left sided CP under her left breast.  The patient currently denies nausea, vomiting, fever, orthopnea, dizziness, PND, cough, congestion, abdominal pain, hematochezia, melena, lower extremity edema, claudication.   Past Medical History  Diagnosis Date  . Esophageal reflux   . Rheumatoid arthritis ([place]) 1975  . Osteopenia   . Polymorphic light eruption 2004  . Hyperlipidemia   . Adhesive capsulitis of left shoulder 1980  . Right middle ear infection 1986  . Acne rosacea   . Allergic rhinitis   . Hypercholesterolemia   . Atrial fibrillation ([place])   . Hypertension   . Edema   . Prolonged QT interval     Past Surgical History  Procedure Laterality Date  . Cardiac catheterization    . Cardioversion    . Temporomandibular joint surgery    . Abdominal hysterectomy      Family History  Problem Relation Age of Onset  . Heart disease Father   . Heart disease Mother   .  Other Mother     prolonged qtc  . Heart disease Sister   . Heart disease Sister   . Healthy Sister     Social History:  reports that she has never smoked. She has never used smokeless tobacco. She reports that she does not drink alcohol or use illicit drugs.  Allergies:  Allergies  Allergen Reactions  . Arthrotec [Diclofenac-Misoprostol] Diarrhea  . Erythromycin     Anaphylaxis   . Morphine Sulfate Other (See Comments)    Erratic behavior; hallucinations  . Gold-Containing Drug Products Rash  . Macrodantin [Nitrofurantoin Macrocrystal] Rash  . Naproxen Rash  . Penicillins Rash    amoxicillin    Medications:  Scheduled Meds: . cefUROXime  500 mg Oral BID WC  . cholecalciferol  2,000 Units Oral Daily  . feeding supplement (ENSURE ENLIVE)  237 mL Oral BID BM  . folic acid  2 mg Oral Daily  . hydrocortisone sod succinate (SOLU-CORTEF) inj  50 mg Intravenous BID  . metoprolol succinate  50 mg Oral Daily  . metoprolol succinate  50 mg Oral QPM  . multivitamin with minerals  1 tablet Oral Daily  . pantoprazole  40 mg Oral Daily  . pravastatin  20 mg Oral QPM   Continuous Infusions: . sodium chloride 10 mL/hr at 05/24/15 0014   PRN Meds:.acetaminophen, fluticasone, metoprolol, oxyCODONE, polyvinyl alcohol   Results for orders placed or performed during the hospital encounter of 05/23/15 (from the past 48 hour(s))  CBC  Status: Abnormal   Collection Time: 05/27/15  5:21 AM  Result Value Ref Range   WBC 8.2 4.0 - 10.5 K/uL   RBC 3.75 (L) 3.87 - 5.11 MIL/uL   Hemoglobin 11.4 (L) 12.0 - 15.0 g/dL   HCT 35.0 (L) 36.0 - 46.0 %   MCV 93.3 78.0 - 100.0 fL   MCH 30.4 26.0 - 34.0 pg   MCHC 32.6 30.0 - 36.0 g/dL   RDW 15.7 (H) 11.5 - 15.5 %   Platelets 272 150 - 400 K/uL  Basic metabolic panel     Status: Abnormal   Collection Time: 05/27/15  5:21 AM  Result Value Ref Range   Sodium 145 135 - 145 mmol/L   Potassium 4.1 3.5 - 5.1 mmol/L   Chloride 109 101 - 111 mmol/L    CO2 26 22 - 32 mmol/L   Glucose, Bld 146 (H) 65 - 99 mg/dL   BUN 17 6 - 20 mg/dL   Creatinine, Ser 0.59 0.44 - 1.00 mg/dL   Calcium 9.0 8.9 - 10.3 mg/dL   GFR calc non Af Amer >60 >60 mL/min   GFR calc Af Amer >60 >60 mL/min    Comment: (NOTE) The eGFR has been calculated using the CKD EPI equation. This calculation has not been validated in all clinical situations. eGFR's persistently <60 mL/min signify possible Chronic Kidney Disease.    Anion gap 10 5 - 15  Vancomycin, trough     Status: Abnormal   Collection Time: 05/27/15  8:33 AM  Result Value Ref Range   Vancomycin Tr 7 (L) 10.0 - 20.0 ug/mL  Body fluid culture     Status: None (Preliminary result)   Collection Time: 05/28/15 11:46 AM  Result Value Ref Range   Specimen Description THORACENTESIS    Special Requests Normal    Gram Stain      ABUNDANT WBC PRESENT,BOTH PMN AND MONONUCLEAR NO ORGANISMS SEEN Performed at [facility]    Culture PENDING    Report Status PENDING   Troponin I (q 6hr x 3)     Status: None   Collection Time: 05/28/15 12:10 PM  Result Value Ref Range   Troponin I <0.03 <0.031 ng/mL    Comment:        NO INDICATION OF MYOCARDIAL INJURY.     Dg Chest 2 View  05/28/2015  CLINICAL DATA:  LEFT pleural effusion, atrial fibrillation, hypertension, shortness of breath at times EXAM: CHEST  2 VIEW COMPARISON:  05/27/2015 FINDINGS: Pigtail LEFT thoracostomy tube unchanged. Enlargement of cardiac silhouette. Mediastinal contours and pulmonary vascular normal. Persistent LEFT lung base density which could represent partially loculated pleural effusion. Coexistent atelectasis LEFT lower lobe. Remaining lungs clear. Central peribronchial thickening. No pneumothorax. IMPRESSION: Persistent LEFT basilar opacity question loculated pleural effusion atelectasis no underlying consolidation and mass cannot be excluded with this appearance. Recommend continued followup until resolution. Electronically Signed    By: Lavonia Dana M.D.   On: 05/28/2015 09:40   Dg Chest 2 View  05/27/2015  CLINICAL DATA:  Severe shortness of breath, pneumonia, chest tube placement for excluded pleural effusion EXAM: CHEST  2 VIEW COMPARISON:  05/25/2015 FINDINGS: Small to moderate left pleural effusion. Left pigtail catheter. No pneumothorax. Cardiomegaly. IMPRESSION: Small moderate left pleural effusion.  Left pigtail catheter. Electronically Signed   By: Julian Hy M.D.   On: 05/27/2015 10:04    Review of Systems  Constitutional: Negative for fever and diaphoresis.  HENT: Negative for congestion and sore throat.  Respiratory: Positive for shortness of breath. Negative for cough.   Cardiovascular: Positive for chest pain. Negative for orthopnea, leg swelling and PND.  Gastrointestinal: Negative for nausea, vomiting, abdominal pain, blood in stool and melena.  Neurological: Negative for dizziness and weakness.  All other systems reviewed and are negative.  Blood pressure 117/74, pulse 81, temperature 98.4 F (36.9 C), temperature source Oral, resp. rate 18, height 5' 3"  (1.6 m), weight 146 lb (66.225 kg), SpO2 97 %. Physical Exam  Nursing note and vitals reviewed. Constitutional: She is oriented to person, place, and time. She appears well-developed and well-nourished. No distress.  HENT:  Head: Normocephalic and atraumatic.  Eyes: EOM are normal. Pupils are equal, round, and reactive to light. No scleral icterus.  Neck: Neck supple. No JVD present.  Cardiovascular: Normal rate, S1 normal and S2 normal.  An irregularly irregular rhythm present. Exam reveals no gallop.   No murmur heard. Pulses:      Radial pulses are 2+ on the right side, and 2+ on the left side.       Dorsalis pedis pulses are 2+ on the right side, and 2+ on the left side.  Respiratory: Effort normal. She has no wheezes. She has no rales.  Decreased breath sounds on the left base  GI: Soft. Bowel sounds are normal. She exhibits no  distension. There is no tenderness.  Musculoskeletal: She exhibits no edema.  Lymphadenopathy:    She has no cervical adenopathy.  Neurological: She is alert and oriented to person, place, and time. She exhibits normal muscle tone.  Skin: Skin is warm and dry.  Psychiatric: She has a normal mood and affect.    Assessment/Plan: Principal Problem:   Acute respiratory failure with hypoxia (HCC) Active Problems:   Hypertension   Rheumatoid arthritis ([place])   HCAP (healthcare-associated pneumonia)   Pleural effusion, left   Pleuritic chest pain   Acute respiratory failure (HCC)   Exudative pleural effusion   Chest tube in place  Appears A. fib rate trend is elevated but around 100 when not moving around. likley elevated due to acute issues and I expect it to improve as the HCAP and effusion so.  Toprol was increased this evening to 50 mg so she is now taking twice daily.  CHADSVASC 3.   Continue on telemetry.   Normally on coumadin.  Needs to be restarted once we know she will not need the effusion drained again.      Tarri Fuller, [facility] 05/28/2015, 3:35 PM   Attending Note:   The patient was seen and examined.  Agree with assessment and plan as noted above.  Changes made to the above note as needed.  Pt has had A-fib for almost a year - reportedly well controlled. Was admitted with an empyema and has had Afib with RVR.  The RVR is most likely due to her infection and stress. She is on metoprolol  50 mg BID  ( was on 50 in AM, 25 mg in PM at home )  The real , long term solution to her elevated HR will be effective treatment of her empyema.  No further recs at this time     Ramond Dial., MD, [facility] 05/28/2015, 7:06 PM [address],  [place] Pager [phone]

## 2015-05-29 ENCOUNTER — Inpatient Hospital Stay (HOSPITAL_COMMUNITY): Payer: Medicare Other

## 2015-05-29 DIAGNOSIS — I482 Chronic atrial fibrillation, unspecified: Secondary | ICD-10-CM | POA: Insufficient documentation

## 2015-05-29 DIAGNOSIS — I4581 Long QT syndrome: Secondary | ICD-10-CM

## 2015-05-29 DIAGNOSIS — Z7901 Long term (current) use of anticoagulants: Secondary | ICD-10-CM | POA: Insufficient documentation

## 2015-05-29 LAB — CULTURE, BLOOD (ROUTINE X 2): Culture: NO GROWTH

## 2015-05-29 LAB — BASIC METABOLIC PANEL
Anion gap: 7 (ref 5–15)
BUN: 16 mg/dL (ref 6–20)
CHLORIDE: 106 mmol/L (ref 101–111)
CO2: 29 mmol/L (ref 22–32)
CREATININE: 0.7 mg/dL (ref 0.44–1.00)
Calcium: 9.1 mg/dL (ref 8.9–10.3)
GFR calc Af Amer: >60 mL/min (ref >60)
GFR calc non Af Amer: >60 mL/min (ref >60)
Glucose, Bld: 105 mg/dL — ABNORMAL HIGH (ref 65–99)
Potassium: 3.8 mmol/L (ref 3.5–5.1)
Sodium: 142 mmol/L (ref 135–145)

## 2015-05-29 LAB — PROTIME-INR
INR: 1.14 (ref 0.00–1.49)
PROTHROMBIN TIME: 14.8 s (ref 11.6–15.2)

## 2015-05-29 LAB — MAGNESIUM: MAGNESIUM: 2.1 mg/dL (ref 1.7–2.4)

## 2015-05-29 LAB — TROPONIN I

## 2015-05-29 MED ORDER — WARFARIN - PHARMACIST DOSING INPATIENT: Freq: Every day | Status: DC

## 2015-05-29 MED ORDER — GUAIFENESIN ER 600 MG PO TB12
600.0000 mg | ORAL_TABLET | Freq: Two times a day (BID) | ORAL | Status: DC
  Administered 2015-05-29 – 2015-06-01 (×7): 600 mg via ORAL
  Filled 2015-05-29 (×8): qty 1

## 2015-05-29 MED ORDER — PREDNISONE 20 MG PO TABS
20.0000 mg | ORAL_TABLET | Freq: Every day | ORAL | Status: DC
  Administered 2015-05-30 – 2015-06-01 (×3): 20 mg via ORAL
  Filled 2015-05-29 (×5): qty 1

## 2015-05-29 MED ORDER — WARFARIN SODIUM 6 MG PO TABS
6.0000 mg | ORAL_TABLET | Freq: Once | ORAL | Status: AC
  Administered 2015-05-29: 6 mg via ORAL
  Filled 2015-05-29: qty 1

## 2015-05-29 NOTE — Progress Notes (Signed)
PULMONARY / CRITICAL CARE MEDICINE   Name: [name] MRN: [id number] DOB: 07-09-1940    ADMISSION DATE:  05/23/2015 CONSULTATION DATE:  05/24/2015   REFERRING MD:  [name]  CHIEF COMPLAINT:  Dyspnea  BRIEF:   75 y/o female with a parapneumonic effusion.   Imaging  05/23/2015 CT chest images personally reviewed showing a moderate sized pleural effusion with compressive atelectasis of the left lower lobe. Questionably there is a few patches of consolidation in the lingula. 05/23/2015 CT head no acute intracranial process 5/11 CXR images personally reviewed> persistent pleural fluid left base   STUDIES:    CULTURES: 5/7 Blood >  5/7 urine >  5/8 pleural fluid>>> 5/12: pleural fluid>>>  ANTIBIOTICS:  Anti-infectives    Start     Dose/Rate Route Frequency Ordered Stop   05/28/15 1700  cefUROXime (CEFTIN) tablet 500 mg     500 mg Oral 2 times daily with meals 05/28/15 1358     05/24/15 0930  ceFEPIme (MAXIPIME) 1 g in dextrose 5 % 50 mL IVPB  Status:  Discontinued     1 g 100 mL/hr over 30 Minutes Intravenous Every 8 hours 05/24/15 0902 05/28/15 1358   05/24/15 0900  vancomycin (VANCOCIN) 500 mg in sodium chloride 0.9 % 100 mL IVPB  Status:  Discontinued     500 mg 100 mL/hr over 60 Minutes Intravenous Every 12 hours 05/23/15 2054 05/27/15 1123   05/24/15 0500  aztreonam (AZACTAM) 2 g in dextrose 5 % 50 mL IVPB  Status:  Discontinued     2 g 100 mL/hr over 30 Minutes Intravenous Every 8 hours 05/23/15 2334 05/24/15 0826   05/23/15 2100  vancomycin (VANCOCIN) IVPB 1000 mg/200 mL premix     1,000 mg 200 mL/hr over 60 Minutes Intravenous  Once 05/23/15 2054 05/23/15 2234   05/23/15 2045  aztreonam (AZACTAM) 2 g in dextrose 5 % 50 mL IVPB     2 g 100 mL/hr over 30 Minutes Intravenous  Once 05/23/15 2040 05/23/15 2119       SIGNIFICANT EVENTS: Left thoracentesis 5/8:   Culture; abundant WBC>>> negative  Cholesterol>>>,protein (fluid): 4.2, LDH (serum)161, protein  (serum) 6.5, Glucose (fluid): 115, LDH (fluid) 313, color: red, cloudy, wbc: 9058. Cytology: negative.  Left repeat thora 5/12: 100 ml serous    LINES/TUBES: 5/9 Chest drain left chest >  5/12 - tube out. Feels better, still mild dyspnea and Chest pain left back    SUBJECTIVE/OVERNIGHT/INTERVAL HX 05/29/15 - 24h post chest tube removal. Feels better. CXR improved . AFebrikle. Appetite better and ate food. Feelign better. Wants to know coumadin restart timing  VITAL SIGNS: BP 124/60 mmHg  Pulse 102  Temp(Src) 97.7 F (36.5 C) (Oral)  Resp 18  Ht 5\' 3"  (1.6 m)  Wt 66.225 kg (146 lb)  BMI 25.87 kg/m2  SpO2 99%  HEMODYNAMICS:    VENTILATOR SETTINGS:    INTAKE / OUTPUT: I/O last 3 completed shifts: In: 43 [P.O.:720] Out: -   PHYSICAL EXAMINATION: General:  Pleasant female, sitting up in chair breathing comfortably  Neuro:  Awake, alert, oriented 4, moves all 4 extremities  HEENT:  Normocephalic/atraumatic, oropharynx clear,  Cardiovascular:  Irregularly irregular rhythm, tachycardic, no murmur Lungs:  Diminished left base, chest drain removed normal effort, clear otherwise Abdomen:  Bowel sounds positive, nontender nondistended Musculoskeletal:  Normal bulk and tone Skin:  No bruising or rash  LABS:   PULMONARY No results for input(s): PHART, PCO2ART, PO2ART, HCO3, TCO2, O2SAT in the last  168 hours.  Invalid input(s): PCO2, PO2  CBC  Recent Labs Lab 05/23/15 1903 05/24/15 0540 05/27/15 0521  HGB 13.5 13.2 11.4*  HCT 41.5 39.0 35.0*  WBC 7.6 7.6 8.2  PLT 237 224 272    COAGULATION  Recent Labs Lab 05/23/15 1903 05/24/15 0705  INR 1.43 1.43    CARDIAC   Recent Labs Lab 05/24/15 0540 05/24/15 1107 05/28/15 1210 05/28/15 1828 05/29/15  TROPONINI <0.03 <0.03 <0.03 <0.03 <0.03   No results for input(s): PROBNP in the last 168 hours.   CHEMISTRY  Recent Labs Lab 05/23/15 1903 05/24/15 0540 05/27/15 0521  NA 136 138 145  K 3.5 4.0 4.1   CL 100* 101 109  CO2 26 26 26   GLUCOSE 108* 163* 146*  BUN 11 11 17   CREATININE 0.73 0.81 0.59  CALCIUM 8.9 9.1 9.0   Estimated Creatinine Clearance: 57.2 mL/min (by C-G formula based on Cr of 0.59).   LIVER  Recent Labs Lab 05/23/15 1903 05/24/15 0540 05/24/15 0705 05/24/15 1230  AST 21 20  --   --   ALT 20 20  --   --   ALKPHOS 65 65  --   --   BILITOT 0.8 1.0  --   --   PROT 6.6 7.1  --  6.5  ALBUMIN 3.7 3.5  --   --   INR 1.43  --  1.43  --      INFECTIOUS  Recent Labs Lab 05/23/15 1922 05/23/15 2223  LATICACIDVEN 1.29 0.90     ENDOCRINE CBG (last 3)  No results for input(s): GLUCAP in the last 72 hours.       IMAGING x48h  - image(s) personally visualized  -   highlighted in bold  cxr 5/13 - improved in my personal oopinion after personal visualization  Dg Chest 2 View  05/28/2015  CLINICAL DATA:  LEFT pleural effusion, atrial fibrillation, hypertension, shortness of breath at times EXAM: CHEST  2 VIEW COMPARISON:  05/27/2015 FINDINGS: Pigtail LEFT thoracostomy tube unchanged. Enlargement of cardiac silhouette. Mediastinal contours and pulmonary vascular normal. Persistent LEFT lung base density which could represent partially loculated pleural effusion. Coexistent atelectasis LEFT lower lobe. Remaining lungs clear. Central peribronchial thickening. No pneumothorax. IMPRESSION: Persistent LEFT basilar opacity question loculated pleural effusion atelectasis no underlying consolidation and mass cannot be excluded with this appearance. Recommend continued followup until resolution. Electronically Signed   By: Lavonia Dana M.D.   On: 05/28/2015 09:40   Dg Chest Port 1 View  05/28/2015  CLINICAL DATA:  Exit pleural effusion, removal of chest to EXAM: PORTABLE CHEST 1 VIEW COMPARISON:  05/28/2015 FINDINGS: Small left pleural effusion. Associated left lower lobe opacity, likely atelectasis. Right lung is clear. No pneumothorax. Interval removal of left chest  tube. Cardiomegaly. IMPRESSION: Interval removal of left chest tube/ pleural drain. No pneumothorax. Small left pleural effusion. Associated left lower lobe opacity, likely atelectasis. Electronically Signed   By: Julian Hy M.D.   On: 05/28/2015 16:09       DISCUSSION: This is a 75 year old female with a past medical history significant for rheumatoid arthritis who presented to [facility] long hospital on 05/24/2015 with a new left-sided pleural effusion in the setting of confusion. This a complicated left parapneumonic pleural effusion.  Her dyspnea has improved but there is still pleural fluid in the left pleural space. We were able to drain 100 ml out w/ thora. We then pulled the CT back 6 cm but still haven't completely drained the  pleural space. We just drained another 100 ml via bedside thora. We will have IR come and remove the CT as it is not draining the fluid space. Will have them pull it slow in effort to hopefully get the rest of it when it travels thru the fluid collection. Still not sure that the fluid is responsible for her dyspnea. She did have significant consolidation. Will cont supportive care. S/p dc chest tuber 05/28/15 and improved cxr 05/29/15  ASSESSMENT / PLAN:   Complicated parapneumonic left Pleural effusion s/p tube thorocastomy 05/25/15 - 05/28/15 Dyspnea-->improved HCAP     - improved subjectively and cxr 05/29/15 - 24h post chest tube removal P:   Ok with po cefuroxim Anticipate slow recovery of residual pleural effusinop Monitopr  Atrial fibrillation P: Continue rate control OK to restart warfarin on 5/13   FAMILY  - Updates: none bedside 5/11. Patient updated 05/29/15  PCCM will reviwit 05/31/15   Dr. Brand Males, M.D., [facility].C.P Pulmonary and Critical Care Medicine Staff Physician [facility] [facility] Pager: [phone], If no answer or between  15:00h - 7:00h: call [phone]  05/29/2015 9:07 AM

## 2015-05-29 NOTE — Progress Notes (Signed)
PROGRESS NOTE  [name] [id number] DOB: 10/22/1940 DOA: 05/23/2015 PCP: Lottie Dawson, MD  HPI/Recap of past 38 hours: 75 year old female with past medical history of chronic atrial fibrillation, rheumatoid arthritis and hypertension admitted on the night of 5/7 after several days of increased confusion. Patient had also been increasingly short of breath with complaints of left-sided pleuritic chest pain. Emergency room, lab work unremarkable and CT of the head was as well. CT scan of the chest noted moderate pleural effusion and patient noted to be hypoxic.  Patient started on oxygen, IV antibiotics and pulmonary consulted.  With oxygenation, patient's mentation improved. Seen by pulmonary and patient underwent left-sided thoracentesis yielding a normal 700 mL's of bloody rust colored fluid.  Pulmonary felt that still sig amount of fluid despite tap concerning for empyema.  Patient S/P US guided left pleural drain, pigtail catheter by IR 5-09 Patient underwent drain removal 5-12. Chest x ray stable. She develops A fib RVR 5-12. Hr better controlled today.   Assessment/Plan:  Acute respiratory failure with hypoxia (HCC) secondary to left-sided exudative pleural effusion, presumed infectious-concerns for empyema.   Continue antibiotics. Awaiting culture results. Pulmonary following Patient S/P US guided left pleural drain, pigtail catheter by IR 5-09.  Plan to repeat Chest x ray tomorrow,  Cytology negative for malignancy, pleural fluid no growth to date.  Chest x ray 5-12; decreased left side effusion.  Vancomycin dc by CCM.  Drain removed 5-12 Continue with Ceftin.   A fib; RVR Develops A fib RVR 5-12 Cardiology consulted.  Metoprolol increase to 50 mg BID.  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 Taper steroids. Change to prednisone  20,, taper to home dose over course of days.  Acute encephalopathy: Secondary to hypoxia. Resolved.     Code Status: Full code   Family Communication: patient   Disposition Plan: Likely will be here over weekend.    Consultants:  Pulmonary   CVTS  Procedures:  Status post thoracentesis of left lung done 5/8:632ml of fluid removed    Chest Tube planned 5/9  Antimicrobials:   IV Azactam 5/71 dose  Cefepime 5/8-present  Vancomycin 5/8-present  DVT prophylaxis: SCDs, Coumadin on hold-continue to hold per pulmonary    Objective: Filed Vitals:   05/28/15 1212 05/28/15 1447 05/28/15 2213 05/29/15 0550  BP: 113/64 117/74 104/51 124/60  Pulse: 142 81 63 102  Temp:  98.4 F (36.9 C) 98.2 F (36.8 C) 97.7 F (36.5 C)  TempSrc:  Oral Oral Oral  Resp:  18 18 18   Height:      Weight:      SpO2:   97% 99%    Intake/Output Summary (Last 24 hours) at 05/29/15 1137 Last data filed at 05/29/15 1012  Gross per 24 hour  Intake    840 ml  Output      0 ml  Net    840 ml   Filed Weights   05/23/15 1931 05/23/15 2256  Weight: 66.225 kg (146 lb) 66.225 kg (146 lb)    Exam:   General:  Alert and oriented 3,  Cardiovascular: Irregular rhythm,  Respiratory: BL crackles, chest drain  in place.   Abdomen: Soft, nontender, nondistended, positive bowel sounds   Musculoskeletal: No clubbing or cyanosis or edema   Skin: No skin breaks, tears or lesions  Data Reviewed: CBC:  Recent Labs Lab 05/23/15 1903 05/24/15 0540 05/27/15  0521  WBC 7.6 7.6 8.2  NEUTROABS 5.1  --   --   HGB 13.5 13.2 11.4*  HCT 41.5 39.0 35.0*  MCV 92.4 90.7 93.3  PLT 237 224 [id number]   Basic Metabolic Panel:  Recent Labs Lab 05/23/15 1903 05/24/15 0540 05/27/15 0521 05/29/15 1105  NA 136 138 145 142  K 3.5 4.0 4.1 3.8  CL 100* 101 109 106  CO2 26 26 26 29   GLUCOSE 108* 163* 146* 105*  BUN 11 11 17 16   CREATININE 0.73 0.81 0.59 0.70  CALCIUM 8.9 9.1 9.0 9.1  MG  --   --   --   2.1   GFR: Estimated Creatinine Clearance: 57.2 mL/min (by C-G formula based on Cr of 0.7). Liver Function Tests:  Recent Labs Lab 05/23/15 1903 05/24/15 0540 05/24/15 1230  AST 21 20  --   ALT 20 20  --   ALKPHOS 65 65  --   BILITOT 0.8 1.0  --   PROT 6.6 7.1 6.5  ALBUMIN 3.7 3.5  --    No results for input(s): LIPASE, AMYLASE in the last 168 hours.  Recent Labs Lab 05/24/15 0006  AMMONIA 12   Coagulation Profile:  Recent Labs Lab 05/23/15 1903 05/24/15 0705  INR 1.43 1.43   Cardiac Enzymes:  Recent Labs Lab 05/24/15 0540 05/24/15 1107 05/28/15 1210 05/28/15 1828 05/29/15  TROPONINI <0.03 <0.03 <0.03 <0.03 <0.03   BNP (last 3 results) No results for input(s): PROBNP in the last 8760 hours. HbA1C: No results for input(s): HGBA1C in the last 72 hours. CBG:  Recent Labs Lab 05/23/15 1903  GLUCAP 102*   Lipid Profile: No results for input(s): CHOL, HDL, LDLCALC, TRIG, CHOLHDL, LDLDIRECT in the last 72 hours. Thyroid Function Tests: No results for input(s): TSH, T4TOTAL, FREET4, T3FREE, THYROIDAB in the last 72 hours. Anemia Panel: No results for input(s): VITAMINB12, FOLATE, FERRITIN, TIBC, IRON, RETICCTPCT in the last 72 hours. Urine analysis:    Component Value Date/Time   COLORURINE AMBER* 05/23/2015 1941   APPEARANCEUR CLEAR 05/23/2015 1941   LABSPEC 1.021 05/23/2015 1941   PHURINE 6.5 05/23/2015 1941   GLUCOSEU NEGATIVE 05/23/2015 1941   HGBUR MODERATE* 05/23/2015 1941   BILIRUBINUR NEGATIVE 05/23/2015 1941   KETONESUR NEGATIVE 05/23/2015 1941   PROTEINUR 30* 05/23/2015 1941   UROBILINOGEN 0.2 10/29/2013 1932   NITRITE NEGATIVE 05/23/2015 1941   LEUKOCYTESUR NEGATIVE 05/23/2015 1941   Sepsis Labs: @LABRCNTIP (procalcitonin:4,lacticidven:4)  ) Recent Results (from the past 240 hour(s))  Culture, blood (Routine x 2)     Status: None   Collection Time: 05/23/15  7:08 PM  Result Value Ref Range Status   Specimen Description BLOOD LEFT  FOREARM  Final   Special Requests BOTTLES DRAWN AEROBIC AND ANAEROBIC 10CC EA  Final   Culture   Final    NO GROWTH 5 DAYS Performed at [facility]    Report Status 05/29/2015 FINAL  Final  Urine culture     Status: None   Collection Time: 05/23/15  7:41 PM  Result Value Ref Range Status   Specimen Description URINE, CATHETERIZED  Final   Special Requests NONE  Final   Culture   Final    NO GROWTH 1 DAY Performed at [facility]    Report Status 05/25/2015 FINAL  Final  Body fluid culture     Status: None   Collection Time: 05/24/15 12:20 PM  Result Value Ref Range Status   Specimen Description PLEURAL  Final  Special Requests Normal  Final   Gram Stain   Final    ABUNDANT WBC PRESENT,BOTH PMN AND MONONUCLEAR NO ORGANISMS SEEN    Culture   Final    NO GROWTH 3 DAYS Performed at [facility]    Report Status 05/28/2015 FINAL  Final  Body fluid culture     Status: None (Preliminary result)   Collection Time: 05/28/15 11:46 AM  Result Value Ref Range Status   Specimen Description THORACENTESIS  Final   Special Requests Normal  Final   Gram Stain   Final    ABUNDANT WBC PRESENT,BOTH PMN AND MONONUCLEAR NO ORGANISMS SEEN    Culture   Final    NO GROWTH < 24 HOURS Performed at [facility]    Report Status PENDING  Incomplete      Studies: Dg Chest Port 1 View  05/28/2015  CLINICAL DATA:  Exit pleural effusion, removal of chest to EXAM: PORTABLE CHEST 1 VIEW COMPARISON:  05/28/2015 FINDINGS: Small left pleural effusion. Associated left lower lobe opacity, likely atelectasis. Right lung is clear. No pneumothorax. Interval removal of left chest tube. Cardiomegaly. IMPRESSION: Interval removal of left chest tube/ pleural drain. No pneumothorax. Small left pleural effusion. Associated left lower lobe opacity, likely atelectasis. Electronically Signed   By: Julian Hy M.D.   On: 05/28/2015 16:09    Scheduled Meds: . cefUROXime  500 mg  Oral BID WC  . cholecalciferol  2,000 Units Oral Daily  . feeding supplement (ENSURE ENLIVE)  237 mL Oral BID BM  . folic acid  2 mg Oral Daily  . hydrocortisone sod succinate (SOLU-CORTEF) inj  50 mg Intravenous BID  . metoprolol succinate  50 mg Oral Daily  . metoprolol succinate  50 mg Oral QPM  . multivitamin with minerals  1 tablet Oral Daily  . pantoprazole  40 mg Oral Daily  . pravastatin  20 mg Oral QPM    Continuous Infusions: . sodium chloride 10 mL/hr at 05/24/15 0014     LOS: 6 days   Time spent: 25 minutes  Elmarie Shiley, MD [facility] Pager [phone]  If 7PM-7AM, please contact night-coverage [email or web address] Password [facility] 05/29/2015, 11:37 AM

## 2015-05-29 NOTE — Progress Notes (Signed)
Patient Name: [name] Date of Encounter: 05/29/2015  Principal Problem:   Acute respiratory failure with hypoxia (HCC) Active Problems:   Hypertension   Rheumatoid arthritis ([place])   HCAP (healthcare-associated pneumonia)   Pleural effusion, left   Pleuritic chest pain   Acute respiratory failure (HCC)   Exudative pleural effusion   Chest tube in place   Length of Stay: 6  SUBJECTIVE  Feeling much better. Still coughing, but dyspnea and pain have improved substantially. Ventricular rate in 80s overnight, 110s this morning when upright or moving.  CURRENT MEDS . cefUROXime  500 mg Oral BID WC  . cholecalciferol  2,000 Units Oral Daily  . feeding supplement (ENSURE ENLIVE)  237 mL Oral BID BM  . folic acid  2 mg Oral Daily  . hydrocortisone sod succinate (SOLU-CORTEF) inj  50 mg Intravenous BID  . metoprolol succinate  50 mg Oral Daily  . metoprolol succinate  50 mg Oral QPM  . multivitamin with minerals  1 tablet Oral Daily  . pantoprazole  40 mg Oral Daily  . pravastatin  20 mg Oral QPM    OBJECTIVE   Intake/Output Summary (Last 24 hours) at 05/29/15 0747 Last data filed at 05/28/15 1700  Gross per 24 hour  Intake    720 ml  Output      0 ml  Net    720 ml   Filed Weights   05/23/15 1931 05/23/15 2256  Weight: 66.225 kg (146 lb) 66.225 kg (146 lb)    PHYSICAL EXAM Filed Vitals:   05/28/15 1212 05/28/15 1447 05/28/15 2213 05/29/15 0550  BP: 113/64 117/74 104/51 124/60  Pulse: 142 81 63 102  Temp:  98.4 F (36.9 C) 98.2 F (36.8 C) 97.7 F (36.5 C)  TempSrc:  Oral Oral Oral  Resp:  18 18 18   Height:      Weight:      SpO2:   97% 99%   General: Alert, oriented x3, no distress Head: no evidence of trauma, PERRL, EOMI, no exophtalmos or lid lag, no myxedema, no xanthelasma; normal ears, nose and oropharynx Neck: normal jugular venous pulsations and no hepatojugular reflux; brisk carotid pulses without delay and no carotid bruits Chest: dull to  percussion and absent breath sounds left base, otherwise clear, symmetrical and full respiratory excursions Cardiovascular: normal position and quality of the apical impulse, irregular rhythm, normal first and second heart sounds, no rubs or gallops, no murmur Abdomen: no tenderness or distention, no masses by palpation, no abnormal pulsatility or arterial bruits, normal bowel sounds, no hepatosplenomegaly Extremities: no clubbing, cyanosis or edema; 2+ radial, ulnar and brachial pulses bilaterally; 2+ right femoral, posterior tibial and dorsalis pedis pulses; 2+ left femoral, posterior tibial and dorsalis pedis pulses; no subclavian or femoral bruits Neurological: grossly nonfocal  LABS  CBC  Recent Labs  05/27/15 0521  WBC 8.2  HGB 11.4*  HCT 35.0*  MCV 93.3  PLT Q000111Q   Basic Metabolic Panel  Recent Labs  05/27/15 0521  NA 145  K 4.1  CL 109  CO2 26  GLUCOSE 146*  BUN 17  CREATININE 0.59  CALCIUM 9.0   Liver Function Tests No results for input(s): AST, ALT, ALKPHOS, BILITOT, PROT, ALBUMIN in the last 72 hours. No results for input(s): LIPASE, AMYLASE in the last 72 hours. Cardiac Enzymes  Recent Labs  05/28/15 1210 05/28/15 1828 05/29/15  TROPONINI <0.03 <0.03 <0.03    Radiology Studies Imaging results have been reviewed and Dg Chest  2 View  05/28/2015  CLINICAL DATA:  LEFT pleural effusion, atrial fibrillation, hypertension, shortness of breath at times EXAM: CHEST  2 VIEW COMPARISON:  05/27/2015 FINDINGS: Pigtail LEFT thoracostomy tube unchanged. Enlargement of cardiac silhouette. Mediastinal contours and pulmonary vascular normal. Persistent LEFT lung base density which could represent partially loculated pleural effusion. Coexistent atelectasis LEFT lower lobe. Remaining lungs clear. Central peribronchial thickening. No pneumothorax. IMPRESSION: Persistent LEFT basilar opacity question loculated pleural effusion atelectasis no underlying consolidation and mass  cannot be excluded with this appearance. Recommend continued followup until resolution. Electronically Signed   By: Lavonia Dana M.D.   On: 05/28/2015 09:40   Dg Chest 2 View  05/27/2015  CLINICAL DATA:  Severe shortness of breath, pneumonia, chest tube placement for excluded pleural effusion EXAM: CHEST  2 VIEW COMPARISON:  05/25/2015 FINDINGS: Small to moderate left pleural effusion. Left pigtail catheter. No pneumothorax. Cardiomegaly. IMPRESSION: Small moderate left pleural effusion.  Left pigtail catheter. Electronically Signed   By: Julian Hy M.D.   On: 05/27/2015 10:04   Dg Chest Port 1 View  05/28/2015  CLINICAL DATA:  Exit pleural effusion, removal of chest to EXAM: PORTABLE CHEST 1 VIEW COMPARISON:  05/28/2015 FINDINGS: Small left pleural effusion. Associated left lower lobe opacity, likely atelectasis. Right lung is clear. No pneumothorax. Interval removal of left chest tube. Cardiomegaly. IMPRESSION: Interval removal of left chest tube/ pleural drain. No pneumothorax. Small left pleural effusion. Associated left lower lobe opacity, likely atelectasis. Electronically Signed   By: Julian Hy M.D.   On: 05/28/2015 16:09    TELE AF with mild rvr   ASSESSMENT AND PLAN  1. Long term persistent (permanent?) atrial fibrillation - dose of beta blocker was just increased. Suspect HR will improve further today. Ideally VR 70-80 at rest and <120 with walking, but may take a few days for full effect of dose change and resolution of acute illness.  2. Anticoagulation - going for a CXR this AM. CHADSVasc 3.  If no further drainage is planned, then resume warfarin today.  3. Mildly prolonged QT interval - preferable to avoid macrolides and quinolones, or at least monitor QT frequently if these drugs are used.. No history of ventricular arrhythmia.  4. Rheumatoid arthritis on Enbrel and MTX. Second chest infection in less than 12 months. May need to reconsider use of immune modulating  agents.   Sanda Klein, MD, [facility] [phone] office [phone] pager 05/29/2015 7:47 AM

## 2015-05-29 NOTE — Progress Notes (Signed)
ANTICOAGULATION CONSULT NOTE - Initial Consult  Pharmacy Consult for Warfarin Indication: atrial fibrillation  Allergies  Allergen Reactions  . Arthrotec [Diclofenac-Misoprostol] Diarrhea  . Erythromycin     Anaphylaxis   . Morphine Sulfate Other (See Comments)    Erratic behavior; hallucinations  . Gold-Containing Drug Products Rash  . Macrodantin [Nitrofurantoin Macrocrystal] Rash  . Naproxen Rash  . Penicillins Rash    amoxicillin    Patient Measurements: Height: 5\' 3"  (160 cm) Weight: 146 lb (66.225 kg) IBW/kg (Calculated) : 52.4   Vital Signs: Temp: 97.7 F (36.5 C) (05/13 0550) Temp Source: Oral (05/13 0550) BP: 124/60 mmHg (05/13 0550) Pulse Rate: 102 (05/13 0550)  Labs:  Recent Labs  05/27/15 0521 05/28/15 1210 05/28/15 1828 05/29/15  HGB 11.4*  --   --   --   HCT 35.0*  --   --   --   PLT 272  --   --   --   CREATININE 0.59  --   --   --   TROPONINI  --  <0.03 <0.03 <0.03    Estimated Creatinine Clearance: 57.2 mL/min (by C-G formula based on Cr of 0.59).   Medical History: Past Medical History  Diagnosis Date  . Esophageal reflux   . Rheumatoid arthritis ([place]) 1975  . Osteopenia   . Polymorphic light eruption 2004  . Hyperlipidemia   . Adhesive capsulitis of left shoulder 1980  . Right middle ear infection 1986  . Acne rosacea   . Allergic rhinitis   . Hypercholesterolemia   . Atrial fibrillation ([place])   . Hypertension   . Edema   . Prolonged QT interval     Medications:  Scheduled:  . cefUROXime  500 mg Oral BID WC  . cholecalciferol  2,000 Units Oral Daily  . feeding supplement (ENSURE ENLIVE)  237 mL Oral BID BM  . folic acid  2 mg Oral Daily  . hydrocortisone sod succinate (SOLU-CORTEF) inj  50 mg Intravenous BID  . metoprolol succinate  50 mg Oral Daily  . metoprolol succinate  50 mg Oral QPM  . multivitamin with minerals  1 tablet Oral Daily  . pantoprazole  40 mg Oral Daily  . pravastatin  20 mg Oral QPM   Infusions:   . sodium chloride 10 mL/hr at 05/24/15 0014    Assessment: 33 yoF admitted on 05/23/2015 with c/o SOB and AMS. Chest CT of 5/7 showed moderate sized left pleural effusion w/ partial compression and atelectasis of LLL.  PTA warfarin for Afib was held prior to procedures.  She underwent thoracentesis on 5/8 and chest tube placement on 5/9 and removal on 5/12. Per Pulmonary, OK to resume warfarin for Afib on 5/13.  PTA warfarin dose was 5mg  daily except 2.5mg  on Thursdays (Last dose 5/7)  Today, 05/29/2015:  INR 1.14  CBC: Hgb 11.4 (on 5/11) and Plt 272.  No bleeding or complications reported  Drug-drug interaction:  Ceftin may elevate INR  Regular diet: eating 75-100% of meals.  Goal of Therapy:  INR 2-3 Monitor platelets by anticoagulation protocol: Yes   Plan:  Warfarin 6 mg PO x 1. Daily PT/INR. Monitor for signs and symptoms of bleeding.   Gretta Arab PharmD, BCPS Pager [phone] 05/29/2015 9:37 AM

## 2015-05-30 LAB — CBC
HCT: 36.4 % (ref 36.0–46.0)
Hemoglobin: 11.7 g/dL — ABNORMAL LOW (ref 12.0–15.0)
MCH: 30.1 pg (ref 26.0–34.0)
MCHC: 32.1 g/dL (ref 30.0–36.0)
MCV: 93.6 fL (ref 78.0–100.0)
PLATELETS: 300 10*3/uL (ref 150–400)
RBC: 3.89 MIL/uL (ref 3.87–5.11)
RDW: 16 % — ABNORMAL HIGH (ref 11.5–15.5)
WBC: 8.7 10*3/uL (ref 4.0–10.5)

## 2015-05-30 LAB — PROTIME-INR
INR: 1.16 (ref 0.00–1.49)
Prothrombin Time: 15 seconds (ref 11.6–15.2)

## 2015-05-30 MED ORDER — WARFARIN SODIUM 6 MG PO TABS
6.0000 mg | ORAL_TABLET | Freq: Once | ORAL | Status: AC
  Administered 2015-05-30: 6 mg via ORAL
  Filled 2015-05-30: qty 1

## 2015-05-30 NOTE — Progress Notes (Signed)
PROGRESS NOTE  [name] [id number] DOB: Mar 30, 1940 DOA: 05/23/2015 PCP: Lottie Dawson, MD  HPI/Recap of past 69 hours: 75 year old female with past medical history of chronic atrial fibrillation, rheumatoid arthritis and hypertension admitted on the night of 5/7 after several days of increased confusion. Patient had also been increasingly short of breath with complaints of left-sided pleuritic chest pain. Emergency room, lab work unremarkable and CT of the head was as well. CT scan of the chest noted moderate pleural effusion and patient noted to be hypoxic.  Patient started on oxygen, IV antibiotics and pulmonary consulted.  With oxygenation, patient's mentation improved. Seen by pulmonary and patient underwent left-sided thoracentesis yielding a normal 700 mL's of bloody rust colored fluid.  Pulmonary felt that still sig amount of fluid despite tap concerning for empyema.  Patient S/P US guided left pleural drain, pigtail catheter by IR 5-09 Patient underwent drain removal 5-12. Chest x ray stable. She develops A fib RVR 5-12. Metoprolol dose increased to 50 mg BID. Cardiology following.   Patient feeling better today, report dyspnea has improved.    Assessment/Plan:  Acute respiratory failure with hypoxia (HCC) secondary to left-sided exudative pleural effusion, presumed infectious-concerns for empyema.   Continue antibiotics. Awaiting culture results. Pulmonary following Patient S/P US guided left pleural drain, pigtail catheter by IR 5-09.  Plan to repeat Chest x ray tomorrow,  Cytology negative for malignancy, pleural fluid no growth to date.  Chest x ray 5-13; decreased left side effusion.  Vancomycin dc by CCM.  Drain removed 5-12 Continue with Ceftin.  Plan to repeat chest x ray 5-13 and will follow CCM recommendations.   A fib; RVR Develops A fib RVR 5-12 Cardiology consulted.  Metoprolol increase to 50 mg BID.  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 Taper steroids. Change to prednisone 20,, taper to home dose over course of days.  Acute encephalopathy: Secondary to hypoxia. Resolved.     Code Status: Full code   Family Communication: patient   Disposition Plan: Likely will be here over weekend.    Consultants:  Pulmonary   CVTS  Procedures:  Status post thoracentesis of left lung done 5/8:675ml of fluid removed    Chest Tube planned 5/9  Antimicrobials:   IV Azactam 5/71 dose  Cefepime 5/8-present  Vancomycin 5/8-present  DVT prophylaxis: SCDs, Coumadin on hold-continue to hold per pulmonary    Objective: Filed Vitals:   05/29/15 0550 05/29/15 1456 05/29/15 2206 05/30/15 0546  BP: 124/60 113/75 91/76 110/78  Pulse: 102 100 74 82  Temp: 97.7 F (36.5 C) 98.2 F (36.8 C) 98 F (36.7 C) 97.4 F (36.3 C)  TempSrc: Oral Oral Oral Oral  Resp: 18 18 18 18   Height:      Weight:      SpO2: 99% 100% 100% 98%    Intake/Output Summary (Last 24 hours) at 05/30/15 1157 Last data filed at 05/30/15 0800  Gross per 24 hour  Intake    600 ml  Output      0 ml  Net    600 ml   Filed Weights   05/23/15 1931 05/23/15 2256  Weight: 66.225 kg (146 lb) 66.225 kg (146 lb)    Exam:   General:  Alert and oriented 3,  Cardiovascular: Irregular rhythm,  Respiratory: BL crackles, chest drain  in place.   Abdomen:  Soft, nontender, nondistended, positive bowel sounds   Musculoskeletal: No clubbing or cyanosis or edema   Skin: No skin breaks, tears or lesions  Data Reviewed: CBC:  Recent Labs Lab 05/23/15 1903 05/24/15 0540 05/27/15 0521 05/30/15 0544  WBC 7.6 7.6 8.2 8.7  NEUTROABS 5.1  --   --   --   HGB 13.5 13.2 11.4* 11.7*  HCT 41.5 39.0 35.0* 36.4  MCV 92.4 90.7 93.3 93.6  PLT 237 224 272 [id number]   Basic Metabolic  Panel:  Recent Labs Lab 05/23/15 1903 05/24/15 0540 05/27/15 0521 05/29/15 1105  NA 136 138 145 142  K 3.5 4.0 4.1 3.8  CL 100* 101 109 106  CO2 26 26 26 29   GLUCOSE 108* 163* 146* 105*  BUN 11 11 17 16   CREATININE 0.73 0.81 0.59 0.70  CALCIUM 8.9 9.1 9.0 9.1  MG  --   --   --  2.1   GFR: Estimated Creatinine Clearance: 57.2 mL/min (by C-G formula based on Cr of 0.7). Liver Function Tests:  Recent Labs Lab 05/23/15 1903 05/24/15 0540 05/24/15 1230  AST 21 20  --   ALT 20 20  --   ALKPHOS 65 65  --   BILITOT 0.8 1.0  --   PROT 6.6 7.1 6.5  ALBUMIN 3.7 3.5  --    No results for input(s): LIPASE, AMYLASE in the last 168 hours.  Recent Labs Lab 05/24/15 0006  AMMONIA 12   Coagulation Profile:  Recent Labs Lab 05/23/15 1903 05/24/15 0705 05/29/15 1105 05/30/15 0544  INR 1.43 1.43 1.14 1.16   Cardiac Enzymes:  Recent Labs Lab 05/24/15 0540 05/24/15 1107 05/28/15 1210 05/28/15 1828 05/29/15  TROPONINI <0.03 <0.03 <0.03 <0.03 <0.03   BNP (last 3 results) No results for input(s): PROBNP in the last 8760 hours. HbA1C: No results for input(s): HGBA1C in the last 72 hours. CBG:  Recent Labs Lab 05/23/15 1903  GLUCAP 102*   Lipid Profile: No results for input(s): CHOL, HDL, LDLCALC, TRIG, CHOLHDL, LDLDIRECT in the last 72 hours. Thyroid Function Tests: No results for input(s): TSH, T4TOTAL, FREET4, T3FREE, THYROIDAB in the last 72 hours. Anemia Panel: No results for input(s): VITAMINB12, FOLATE, FERRITIN, TIBC, IRON, RETICCTPCT in the last 72 hours. Urine analysis:    Component Value Date/Time   COLORURINE AMBER* 05/23/2015 1941   APPEARANCEUR CLEAR 05/23/2015 1941   LABSPEC 1.021 05/23/2015 1941   PHURINE 6.5 05/23/2015 1941   GLUCOSEU NEGATIVE 05/23/2015 1941   HGBUR MODERATE* 05/23/2015 1941   BILIRUBINUR NEGATIVE 05/23/2015 [place] NEGATIVE 05/23/2015 1941   PROTEINUR 30* 05/23/2015 1941   UROBILINOGEN 0.2 10/29/2013 1932    NITRITE NEGATIVE 05/23/2015 1941   LEUKOCYTESUR NEGATIVE 05/23/2015 1941   Sepsis Labs: @LABRCNTIP (procalcitonin:4,lacticidven:4)  ) Recent Results (from the past 240 hour(s))  Culture, blood (Routine x 2)     Status: None   Collection Time: 05/23/15  7:08 PM  Result Value Ref Range Status   Specimen Description BLOOD LEFT FOREARM  Final   Special Requests BOTTLES DRAWN AEROBIC AND ANAEROBIC 10CC EA  Final   Culture   Final    NO GROWTH 5 DAYS Performed at [facility]    Report Status 05/29/2015 FINAL  Final  Urine culture     Status: None   Collection Time: 05/23/15  7:41 PM  Result Value Ref Range Status   Specimen Description URINE, CATHETERIZED  Final   Special Requests NONE  Final   Culture  Final    NO GROWTH 1 DAY Performed at [facility]    Report Status 05/25/2015 FINAL  Final  Body fluid culture     Status: None   Collection Time: 05/24/15 12:20 PM  Result Value Ref Range Status   Specimen Description PLEURAL  Final   Special Requests Normal  Final   Gram Stain   Final    ABUNDANT WBC PRESENT,BOTH PMN AND MONONUCLEAR NO ORGANISMS SEEN    Culture   Final    NO GROWTH 3 DAYS Performed at [facility]    Report Status 05/28/2015 FINAL  Final  Body fluid culture     Status: None (Preliminary result)   Collection Time: 05/28/15 11:46 AM  Result Value Ref Range Status   Specimen Description THORACENTESIS  Final   Special Requests Normal  Final   Gram Stain   Final    ABUNDANT WBC PRESENT,BOTH PMN AND MONONUCLEAR NO ORGANISMS SEEN    Culture   Final    NO GROWTH 2 DAYS Performed at [facility]    Report Status PENDING  Incomplete      Studies: No results found.  Scheduled Meds: . cefUROXime  500 mg Oral BID WC  . cholecalciferol  2,000 Units Oral Daily  . feeding supplement (ENSURE ENLIVE)  237 mL Oral BID BM  . folic acid  2 mg Oral Daily  . guaiFENesin  600 mg Oral BID  . metoprolol succinate  50 mg Oral Daily   . metoprolol succinate  50 mg Oral QPM  . multivitamin with minerals  1 tablet Oral Daily  . pantoprazole  40 mg Oral Daily  . pravastatin  20 mg Oral QPM  . predniSONE  20 mg Oral Q breakfast  . warfarin  6 mg Oral ONCE-1800  . Warfarin - Pharmacist Dosing Inpatient   Does not apply q1800    Continuous Infusions: . sodium chloride 10 mL/hr at 05/24/15 0014     LOS: 7 days   Time spent: 25 minutes  Elmarie Shiley, MD [facility] Pager [phone]  If 7PM-7AM, please contact night-coverage [email or web address] Password [facility] 05/30/2015, 11:57 AM

## 2015-05-30 NOTE — Progress Notes (Signed)
ANTICOAGULATION CONSULT NOTE - follow up  Pharmacy Consult for Warfarin Indication: atrial fibrillation  Allergies  Allergen Reactions  . Arthrotec [Diclofenac-Misoprostol] Diarrhea  . Erythromycin     Anaphylaxis   . Morphine Sulfate Other (See Comments)    Erratic behavior; hallucinations  . Gold-Containing Drug Products Rash  . Macrodantin [Nitrofurantoin Macrocrystal] Rash  . Naproxen Rash  . Penicillins Rash    amoxicillin    Patient Measurements: Height: 5\' 3"  (160 cm) Weight: 146 lb (66.225 kg) IBW/kg (Calculated) : 52.4   Vital Signs: Temp: 97.4 F (36.3 C) (05/14 0546) Temp Source: Oral (05/14 0546) BP: 110/78 mmHg (05/14 0546) Pulse Rate: 82 (05/14 0546)  Labs:  Recent Labs  05/28/15 1210 05/28/15 1828 05/29/15 05/29/15 1105 05/30/15 0544  HGB  --   --   --   --  11.7*  HCT  --   --   --   --  36.4  PLT  --   --   --   --  300  LABPROT  --   --   --  14.8 15.0  INR  --   --   --  1.14 1.16  CREATININE  --   --   --  0.70  --   TROPONINI <0.03 <0.03 <0.03  --   --     Estimated Creatinine Clearance: 57.2 mL/min (by C-G formula based on Cr of 0.7).   Medical History: Past Medical History  Diagnosis Date  . Esophageal reflux   . Rheumatoid arthritis ([place]) 1975  . Osteopenia   . Polymorphic light eruption 2004  . Hyperlipidemia   . Adhesive capsulitis of left shoulder 1980  . Right middle ear infection 1986  . Acne rosacea   . Allergic rhinitis   . Hypercholesterolemia   . Atrial fibrillation ([place])   . Hypertension   . Edema   . Prolonged QT interval     Medications:  Scheduled:  . cefUROXime  500 mg Oral BID WC  . cholecalciferol  2,000 Units Oral Daily  . feeding supplement (ENSURE ENLIVE)  237 mL Oral BID BM  . folic acid  2 mg Oral Daily  . guaiFENesin  600 mg Oral BID  . metoprolol succinate  50 mg Oral Daily  . metoprolol succinate  50 mg Oral QPM  . multivitamin with minerals  1 tablet Oral Daily  . pantoprazole  40 mg Oral  Daily  . pravastatin  20 mg Oral QPM  . predniSONE  20 mg Oral Q breakfast  . Warfarin - Pharmacist Dosing Inpatient   Does not apply q1800   Infusions:  . sodium chloride 10 mL/hr at 05/24/15 0014    Assessment: 65 yoF admitted on 05/23/2015 with c/o SOB and AMS. Chest CT of 5/7 showed moderate sized left pleural effusion w/ partial compression and atelectasis of LLL.  PTA warfarin for Afib was held prior to procedures.  She underwent thoracentesis on 5/8 and chest tube placement on 5/9 and removal on 5/12. Per Pulmonary, OK to resume warfarin for Afib on 5/13.  PTA warfarin dose was 5mg  daily except 2.5mg  on Thursdays (Last dose 5/7)  Today, 05/30/2015:  INR 1.16 as expected after only one dose of warfarin 6mg  yesterday  CBC stable  No bleeding or complications reported  Drug-drug interaction:  Ceftin may elevate INR  Regular diet: eating 75-100% of meals.  Goal of Therapy:  INR 2-3 Monitor platelets by anticoagulation protocol: Yes   Plan:  1) Repeat warfarin 6mg   today 2) Daily INR   Adrian Saran, PharmD, BCPS Pager [phone] 05/30/2015 7:46 AM

## 2015-05-31 ENCOUNTER — Inpatient Hospital Stay (HOSPITAL_COMMUNITY): Payer: Medicare Other

## 2015-05-31 LAB — CBC
HEMATOCRIT: 38.9 % (ref 36.0–46.0)
HEMOGLOBIN: 12.4 g/dL (ref 12.0–15.0)
MCH: 29.9 pg (ref 26.0–34.0)
MCHC: 31.9 g/dL (ref 30.0–36.0)
MCV: 93.7 fL (ref 78.0–100.0)
Platelets: 349 10*3/uL (ref 150–400)
RBC: 4.15 MIL/uL (ref 3.87–5.11)
RDW: 15.9 % — ABNORMAL HIGH (ref 11.5–15.5)
WBC: 10.4 10*3/uL (ref 4.0–10.5)

## 2015-05-31 LAB — BODY FLUID CULTURE
Culture: NO GROWTH
SPECIAL REQUESTS: NORMAL

## 2015-05-31 LAB — PROTIME-INR
INR: 1.28 (ref 0.00–1.49)
PROTHROMBIN TIME: 16.1 s — AB (ref 11.6–15.2)

## 2015-05-31 MED ORDER — WARFARIN SODIUM 7.5 MG PO TABS
7.5000 mg | ORAL_TABLET | Freq: Once | ORAL | Status: AC
  Administered 2015-05-31: 7.5 mg via ORAL
  Filled 2015-05-31: qty 1

## 2015-05-31 MED ORDER — DILTIAZEM HCL 30 MG PO TABS
30.0000 mg | ORAL_TABLET | Freq: Three times a day (TID) | ORAL | Status: DC
  Administered 2015-05-31 – 2015-06-01 (×3): 30 mg via ORAL
  Filled 2015-05-31 (×6): qty 1

## 2015-05-31 NOTE — Progress Notes (Signed)
Physical Therapy Treatment Patient Details Name: [name] MRN: [id number] DOB: 03-30-1940 Today's Date: 05/31/2015    History of Present Illness 79 y0 femail admitted through ED 05/23/15 with AMS, CP and SOB.  Pt dx wtih acute resp failure with hypoxia 2* pleural effusion.  Pt s/p multiple thoracnetesis including this am.  Pt with hx of RA and A-fib    PT Comments    Pt feeling better but still "concerned" with her A Fib.  Amb well however noted HR increased to 139.  Follow Up Recommendations  No PT follow up     Equipment Recommendations  None recommended by PT    Recommendations for Other Services       Precautions / Restrictions Precautions Precautions: Fall    Mobility  Bed Mobility               General bed mobility comments: Pt OOB in recliner   Transfers Overall transfer level: Needs assistance Equipment used: None Transfers: Sit to/from Stand Sit to Stand: Supervision;Min guard         General transfer comment: one VC safety with turns in bathroom.  Good use of hands to steady self.   Ambulation/Gait Ambulation/Gait assistance: Supervision;Min guard Ambulation Distance (Feet): 128 Feet Assistive device: 1 person hand held assist Gait Pattern/deviations: Step-through pattern;Drifts right/left     General Gait Details: slight drift with c/o of her heart "dancing" with increased amb distance.  HR increased from 87 to 139.     Stairs            Wheelchair Mobility    Modified Rankin (Stroke Patients Only)       Balance                                    Cognition Arousal/Alertness: Awake/alert Behavior During Therapy: WFL for tasks assessed/performed Overall Cognitive Status: Within Functional Limits for tasks assessed                      Exercises      General Comments        Pertinent Vitals/Pain Pain Assessment: No/denies pain    Home Living                      Prior Function             PT Goals (current goals can now be found in the care plan section) Progress towards PT goals: Progressing toward goals    Frequency  Min 3X/week    PT Plan Current plan remains appropriate    Co-evaluation             End of Session Equipment Utilized During Treatment: Gait belt Activity Tolerance: Patient limited by fatigue Patient left: in chair;with call bell/phone within reach     Time: 1013-1027 PT Time Calculation (min) (ACUTE ONLY): 14 min  Charges:  $Gait Training: 8-22 mins                    G Codes:      Rica Koyanagi  PTA [facility]  Acute  Rehab Pager      [phone]

## 2015-05-31 NOTE — Care Management Important Message (Signed)
Important Message  Patient Details  Name: [name] MRN: [id number] Date of Birth: 1941-01-12   Medicare Important Message Given:  Yes    Camillo Flaming 05/31/2015, 11:29 [place] Message  Patient Details  Name: [name] MRN: [id number] Date of Birth: [birth year]   Medicare Important Message Given:  Yes    Camillo Flaming 05/31/2015, 11:28 AM

## 2015-05-31 NOTE — Progress Notes (Addendum)
Pt profile:  75 y/o female with a parapneumonic effusion s/p tube thorocastomy 05/25/15 - 05/28/15 developed RVR with her a fib.   Subjective: No chest pain, no complaints.   Objective: Vital signs in last 24 hours: Temp:  [97.5 F (36.4 C)-98.5 F (36.9 C)] 97.7 F (36.5 C) (05/15 0536) Pulse Rate:  [73-82] 80 (05/15 0536) Resp:  [18] 18 (05/15 0536) BP: (103-109)/(50-78) 109/78 mmHg (05/15 0536) SpO2:  [98 %-100 %] 99 % (05/15 0536) Weight change:  Last BM Date: 05/28/15 Intake/Output from previous day: 05/14 0701 - 05/15 0700 In: 840 [P.O.:840] Out: -  Intake/Output this shift:    PE: General:Pleasant affect, NAD, sitting in chair, coughs with talking Skin:Warm and dry, brisk capillary refill HEENT:normocephalic, sclera clear, mucus membranes moist Neck:supple, no JVD, no bruits  Heart:irreg irreg without murmur, gallup, rub or click Lungs:clear without rales, rhonchi, or wheezes [id number], non tender, + BS, do not palpate liver spleen or masses Ext:no lower ext edema, 2+ pedal pulses, 2+ radial pulses Neuro:alert and oriented X 3, MAE, follows commands, + facial symmetry   Tele:   afib at 110 to 140  Lab Results:  Recent Labs  05/30/15 0544 05/31/15 0506  WBC 8.7 10.4  HGB 11.7* 12.4  HCT 36.4 38.9  PLT 300 349   BMET  Recent Labs  05/29/15 1105  NA 142  K 3.8  CL 106  CO2 29  GLUCOSE 105*  BUN 16  CREATININE 0.70  CALCIUM 9.1    Recent Labs  05/28/15 1828 05/29/15  TROPONINI <0.03 <0.03    Lab Results  Component Value Date   CHOL 134 05/24/2015   HDL 36.60* 05/21/2013   LDLCALC 79 05/21/2013   TRIG 148.0 05/21/2013   CHOLHDL 4 05/21/2013   Lab Results  Component Value Date   HGBA1C 5.8* 04/24/2015     Lab Results  Component Value Date   TSH 1.752 04/24/2015     Studies/Results: Dg Chest 2 View  05/29/2015  CLINICAL DATA:  75 year old female with history of pleural effusion. Shortness of breath and chest pain. EXAM:  CHEST  2 VIEW COMPARISON:  Chest x-ray 05/28/2015. FINDINGS: Small left pleural effusion appears to be decreasing. Decreasing atelectasis and/or consolidation in the left lung base. Right lung is clear. No right pleural effusion. No evidence of pulmonary edema. Heart size appears mildly enlarged. Upper mediastinal contours are within normal limits. IMPRESSION: 1. Decreasing small left pleural effusion with probable resolving subsegmental atelectasis in the left lower lobe. 2. Mild cardiomegaly. Electronically Signed   By: Vinnie Langton M.D.   On: 05/29/2015 13:20    Medications: I have reviewed the patient's current medications. Scheduled Meds: . cefUROXime  500 mg Oral BID WC  . cholecalciferol  2,000 Units Oral Daily  . feeding supplement (ENSURE ENLIVE)  237 mL Oral BID BM  . folic acid  2 mg Oral Daily  . guaiFENesin  600 mg Oral BID  . metoprolol succinate  50 mg Oral Daily  . metoprolol succinate  50 mg Oral QPM  . multivitamin with minerals  1 tablet Oral Daily  . pantoprazole  40 mg Oral Daily  . pravastatin  20 mg Oral QPM  . predniSONE  20 mg Oral Q breakfast  . Warfarin - Pharmacist Dosing Inpatient   Does not apply q1800   Continuous Infusions: . sodium chloride 10 mL/hr at 05/24/15 0014   PRN Meds:.acetaminophen, fluticasone, metoprolol, oxyCODONE, polyvinyl alcohol  Assessment/Plan: Principal Problem:  Acute respiratory failure with hypoxia (HCC) Active Problems:   Hypertension   Rheumatoid arthritis ([place])   HCAP (healthcare-associated pneumonia)   Pleural effusion, left   Pleuritic chest pain   Acute respiratory failure (HCC)   Exudative pleural effusion   Chest tube in place   Persistent atrial fibrillation ([place])   Long term (current) use of anticoagulants   Long QT interval  1. Long term permanent (since 05/2014 at least) atrial fibrillation - dose of beta blocker was increased 05/28/15. HR remains elevated. Ideally VR 70-80 at rest and <120 with walking, has  been 3 days and HR 110-140. ? Add dilt for now in addition to BB?  Dr. Johnsie Cancel to see. HR may be driven by lungs though this has improved.   2. Anticoagulation - going for a CXR this AM. CHADSVasc 3. resumed warfarin 05/29/15  INR 1.28.  3. Mildly prolonged QT interval - preferable to avoid macrolides and quinolones, or at least monitor QT frequently if these drugs are used.. No history of ventricular arrhythmia.  4. Rheumatoid arthritis on Enbrel and MTX. Second chest infection in less than 12 months. May need to reconsider use of immune modulating agents.   LOS: 8 days   Time spent with pt. :15 minutes. Cecilie Kicks  Nurse Practitioner Certified Pager [phone] or after 5pm and on weekends call [phone] 05/31/2015, 8:02 AM  Patient examined chart reviewed. Still with mild expitory wheezes.  Agree with adding cardizem for better Rate control.  INR sub Rx pharmacy following.  Antibiotics per CCM Reviewed CXR from this am and  Stable left pleural effusion   Jenkins Rouge

## 2015-05-31 NOTE — Progress Notes (Signed)
PROGRESS NOTE  [name] [id number] DOB: August 12, 1940 DOA: 05/23/2015 PCP: Lottie Dawson, MD  HPI/Recap of past 7 hours: 75 year old female with past medical history of chronic atrial fibrillation, rheumatoid arthritis and hypertension admitted on the night of 5/7 after several days of increased confusion. Patient had also been increasingly short of breath with complaints of left-sided pleuritic chest pain. Emergency room, lab work unremarkable and CT of the head was as well. CT scan of the chest noted moderate pleural effusion and patient noted to be hypoxic.  Patient started on oxygen, IV antibiotics and pulmonary consulted.  With oxygenation, patient's mentation improved. Seen by pulmonary and patient underwent left-sided thoracentesis yielding a normal 700 mL's of bloody rust colored fluid.  Pulmonary felt that still sig amount of fluid despite tap concerning for empyema.  Patient S/P US guided left pleural drain, pigtail catheter by IR 5-09 Patient underwent drain removal 5-12. Chest x ray stable. She develops A fib RVR 5-12. Metoprolol dose increased to 50 mg BID. Cardiology following.   Patient feeling better, report dyspnea has improved. Still feels HR raising.    Assessment/Plan:  Acute respiratory failure with hypoxia (HCC) secondary to left-sided exudative pleural effusion, presumed infectious-concerns for empyema.   Continue antibiotics. Awaiting culture results. Pulmonary following Patient S/P US guided left pleural drain, pigtail catheter by IR 5-09.  Plan to repeat Chest x ray tomorrow,  Cytology negative for malignancy, pleural fluid no growth to date.  Chest x ray 5-13; decreased left side effusion.  Vancomycin dc by CCM.  Drain removed 5-12 Continue with Ceftin.  X ray stable, ok to discharge per CCM, follow chest x ray in 2 weeks.  10 days of antibiotics.  A fib; RVR Develops A fib RVR 5-12 Cardiology consulted.  Metoprolol increase to 50 mg  BID.  Appreciate cardiology help/  HR elevated, plan to start cardizem. Monitor overnight on new medication.   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 Taper steroids. Change to prednisone 20,, taper to home dose over course of days.  Acute encephalopathy: Secondary to hypoxia. Resolved.     Code Status: Full code   Family Communication: patient   Disposition Plan: home tomorrow if HR better controlled.    Consultants:  Pulmonary   CVTS  Procedures:  Status post thoracentesis of left lung done 5/8:675ml of fluid removed    Chest Tube planned 5/9  Antimicrobials:   IV Azactam 5/71 dose  Cefepime 5/8-present  Vancomycin 5/8-present  DVT prophylaxis: SCDs, Coumadin on hold-continue to hold per pulmonary    Objective: Filed Vitals:   05/30/15 1445 05/30/15 2154 05/31/15 0536 05/31/15 1435  BP: 103/68 107/50 109/78 104/62  Pulse: 82 73 80 109  Temp: 97.5 F (36.4 C) 98.5 F (36.9 C) 97.7 F (36.5 C) 97.8 F (36.6 C)  TempSrc: Oral Oral Oral Oral  Resp: 18 18 18 18   Height:      Weight:      SpO2: 98% 100% 99% 97%    Intake/Output Summary (Last 24 hours) at 05/31/15 1514 Last data filed at 05/31/15 0748  Gross per 24 hour  Intake    600 ml  Output      0 ml  Net    600 ml   Filed Weights   05/23/15 1931 05/23/15 2256  Weight: 66.225 kg (146 lb) 66.225 kg (146 lb)    Exam:   General:  Alert and oriented 3,  Cardiovascular: Irregular rhythm,  Respiratory: BL crackles, chest drain  in place.   Abdomen: Soft, nontender, nondistended, positive bowel sounds   Musculoskeletal: No clubbing or cyanosis or edema   Skin: No skin breaks, tears or lesions  Data Reviewed: CBC:  Recent Labs Lab 05/27/15 0521 05/30/15 0544 05/31/15 0506  WBC 8.2 8.7 10.4  HGB 11.4* 11.7* 12.4  HCT 35.0* 36.4 38.9  MCV 93.3 93.6 93.7  PLT 272 300 0000000   Basic Metabolic Panel:  Recent  Labs Lab 05/27/15 0521 05/29/15 1105  NA 145 142  K 4.1 3.8  CL 109 106  CO2 26 29  GLUCOSE 146* 105*  BUN 17 16  CREATININE 0.59 0.70  CALCIUM 9.0 9.1  MG  --  2.1   GFR: Estimated Creatinine Clearance: 57.2 mL/min (by C-G formula based on Cr of 0.7). Liver Function Tests: No results for input(s): AST, ALT, ALKPHOS, BILITOT, PROT, ALBUMIN in the last 168 hours. No results for input(s): LIPASE, AMYLASE in the last 168 hours. No results for input(s): AMMONIA in the last 168 hours. Coagulation Profile:  Recent Labs Lab 05/29/15 1105 05/30/15 0544 05/31/15 0506  INR 1.14 1.16 1.28   Cardiac Enzymes:  Recent Labs Lab 05/28/15 1210 05/28/15 1828 05/29/15  TROPONINI <0.03 <0.03 <0.03   BNP (last 3 results) No results for input(s): PROBNP in the last 8760 hours. HbA1C: No results for input(s): HGBA1C in the last 72 hours. CBG: No results for input(s): GLUCAP in the last 168 hours. Lipid Profile: No results for input(s): CHOL, HDL, LDLCALC, TRIG, CHOLHDL, LDLDIRECT in the last 72 hours. Thyroid Function Tests: No results for input(s): TSH, T4TOTAL, FREET4, T3FREE, THYROIDAB in the last 72 hours. Anemia Panel: No results for input(s): VITAMINB12, FOLATE, FERRITIN, TIBC, IRON, RETICCTPCT in the last 72 hours. Urine analysis:    Component Value Date/Time   COLORURINE AMBER* 05/23/2015 1941   APPEARANCEUR CLEAR 05/23/2015 1941   LABSPEC 1.021 05/23/2015 1941   PHURINE 6.5 05/23/2015 1941   GLUCOSEU NEGATIVE 05/23/2015 1941   HGBUR MODERATE* 05/23/2015 1941   BILIRUBINUR NEGATIVE 05/23/2015 [place] NEGATIVE 05/23/2015 1941   PROTEINUR 30* 05/23/2015 1941   UROBILINOGEN 0.2 10/29/2013 1932   NITRITE NEGATIVE 05/23/2015 1941   LEUKOCYTESUR NEGATIVE 05/23/2015 1941   Sepsis Labs: @LABRCNTIP (procalcitonin:4,lacticidven:4)  ) Recent Results (from the past 240 hour(s))  Culture, blood (Routine x 2)     Status: None   Collection Time: 05/23/15  7:08 PM   Result Value Ref Range Status   Specimen Description BLOOD LEFT FOREARM  Final   Special Requests BOTTLES DRAWN AEROBIC AND ANAEROBIC 10CC EA  Final   Culture   Final    NO GROWTH 5 DAYS Performed at [facility]    Report Status 05/29/2015 FINAL  Final  Urine culture     Status: None   Collection Time: 05/23/15  7:41 PM  Result Value Ref Range Status   Specimen Description URINE, CATHETERIZED  Final   Special Requests NONE  Final   Culture   Final    NO GROWTH 1 DAY Performed at [facility]    Report Status 05/25/2015 FINAL  Final  Body fluid culture     Status: None   Collection Time: 05/24/15 12:20 PM  Result Value Ref Range Status   Specimen Description PLEURAL  Final   Special Requests Normal  Final   Gram Stain   Final    ABUNDANT WBC PRESENT,BOTH PMN AND MONONUCLEAR NO ORGANISMS SEEN    Culture  Final    NO GROWTH 3 DAYS Performed at [facility]    Report Status 05/28/2015 FINAL  Final  Body fluid culture     Status: None   Collection Time: 05/28/15 11:46 AM  Result Value Ref Range Status   Specimen Description THORACENTESIS  Final   Special Requests Normal  Final   Gram Stain   Final    ABUNDANT WBC PRESENT,BOTH PMN AND MONONUCLEAR NO ORGANISMS SEEN    Culture   Final    NO GROWTH 3 DAYS Performed at [facility]    Report Status 05/31/2015 FINAL  Final      Studies: Dg Chest 2 View  05/31/2015  CLINICAL DATA:  Left-sided pleural effusion EXAM: CHEST  2 VIEW COMPARISON:  05/29/2015 FINDINGS: Cardiac shadow is enlarged. The right lung remains clear. The left lung shows stable pleural effusion. No underlying pneumothorax is noted. No bony abnormality is seen. IMPRESSION: Stable left pleural effusion.  No new focal abnormality is seen. Electronically Signed   By: Inez Catalina M.D.   On: 05/31/2015 08:19    Scheduled Meds: . cefUROXime  500 mg Oral BID WC  . cholecalciferol  2,000 Units Oral Daily  . diltiazem  30 mg Oral  Q8H  . feeding supplement (ENSURE ENLIVE)  237 mL Oral BID BM  . folic acid  2 mg Oral Daily  . guaiFENesin  600 mg Oral BID  . metoprolol succinate  50 mg Oral Daily  . metoprolol succinate  50 mg Oral QPM  . multivitamin with minerals  1 tablet Oral Daily  . pantoprazole  40 mg Oral Daily  . pravastatin  20 mg Oral QPM  . predniSONE  20 mg Oral Q breakfast  . warfarin  7.5 mg Oral ONCE-1800  . Warfarin - Pharmacist Dosing Inpatient   Does not apply q1800    Continuous Infusions: . sodium chloride 10 mL/hr at 05/24/15 0014     LOS: 8 days   Time spent: 25 minutes  Elmarie Shiley, MD [facility] Pager [phone]  If 7PM-7AM, please contact night-coverage [email or web address] Password [facility] 05/31/2015, 3:14 PM

## 2015-05-31 NOTE — Progress Notes (Signed)
PULMONARY / CRITICAL CARE MEDICINE   Name: [name] MRN: [id number] DOB: 1940/05/17    ADMISSION DATE:  05/23/2015 CONSULTATION DATE:  05/24/2015   REFERRING MD:  [name]  CHIEF COMPLAINT:  Dyspnea  BRIEF:   75 y/o female with a parapneumonic effusion.   Imaging  05/23/2015 CT chest images personally reviewed showing a moderate sized pleural effusion with compressive atelectasis of the left lower lobe. Questionably there is a few patches of consolidation in the lingula. 05/23/2015 CT head no acute intracranial process 5/11 CXR images personally reviewed> persistent pleural fluid left base   STUDIES:    CULTURES: 5/7 Blood >  5/7 urine >  5/8 pleural fluid>>> negative 5/12: pleural fluid>>> NGTD  ANTIBIOTICS: 5/7 vanc > 5/11 5/7 aztreonam x1 dose  5/8 Cefepime > 5/12 5/12 cefuroxime >    SIGNIFICANT EVENTS: Left thoracentesis 5/8:   Culture; abundant WBC>>> negative  Cholesterol>>>,protein (fluid): 4.2, LDH (serum)161, protein (serum) 6.5, Glucose (fluid): 115, LDH (fluid) 313, color: red, cloudy, wbc: 9058. Cytology: negative.  Left repeat thora 5/12: 100 ml serous    LINES/TUBES: 5/9 Chest drain left chest > 5/12   SUBJECTIVE/OVERNIGHT/INTERVAL HX Feels better  VITAL SIGNS: BP 109/78 mmHg  Pulse 80  Temp(Src) 97.7 F (36.5 C) (Oral)  Resp 18  Ht 5\' 3"  (1.6 m)  Wt 146 lb (66.225 kg)  BMI 25.87 kg/m2  SpO2 99%  HEMODYNAMICS:    VENTILATOR SETTINGS:    INTAKE / OUTPUT: I/O last 3 completed shifts: In: 840 [P.O.:840] Out: -   PHYSICAL EXAMINATION: General:  Comfortable in chair HENT: NCAT OP clear PULM: diminished left base, clear otherwise CV: Irreg irreg, no mgr GI: BS+, soft, nontender MSK: normal bulk and tone Neuro: A&Ox4, maew  LABS:   PULMONARY No results for input(s): PHART, PCO2ART, PO2ART, HCO3, TCO2, O2SAT in the last 168 hours.  Invalid input(s): PCO2, PO2  CBC  Recent Labs Lab 05/27/15 0521 05/30/15 0544  05/31/15 0506  HGB 11.4* 11.7* 12.4  HCT 35.0* 36.4 38.9  WBC 8.2 8.7 10.4  PLT 272 300 349    COAGULATION  Recent Labs Lab 05/29/15 1105 05/30/15 0544 05/31/15 0506  INR 1.14 1.16 1.28    CARDIAC    Recent Labs Lab 05/24/15 1107 05/28/15 1210 05/28/15 1828 05/29/15  TROPONINI <0.03 <0.03 <0.03 <0.03   No results for input(s): PROBNP in the last 168 hours.   CHEMISTRY  Recent Labs Lab 05/27/15 0521 05/29/15 1105  NA 145 142  K 4.1 3.8  CL 109 106  CO2 26 29  GLUCOSE 146* 105*  BUN 17 16  CREATININE 0.59 0.70  CALCIUM 9.0 9.1  MG  --  2.1   Estimated Creatinine Clearance: 57.2 mL/min (by C-G formula based on Cr of 0.7).   LIVER  Recent Labs Lab 05/24/15 1230 05/29/15 1105 05/30/15 0544 05/31/15 0506  PROT 6.5  --   --   --   INR  --  1.14 1.16 1.28     INFECTIOUS No results for input(s): LATICACIDVEN, PROCALCITON in the last 168 hours.   ENDOCRINE CBG (last 3)  No results for input(s): GLUCAP in the last 72 hours.     5/15 CXR > personally reviewed, improved compared to last week    DISCUSSION: 75 y/o female with a complicated parapneumonic effusion drained with thoracentesis and a chest drain.  Now with persistent small volume fluid in the left chest, all cultures negative so not empyema.  She has had symptomatic improvement.  At this point does not need further intervention for pleural fluid, suspect it will be present on her CXR for several weeks to months.   ASSESSMENT / PLAN:   Complicated parapneumonic left Pleural effusion s/p tube thorocastomy 05/25/15 - 05/28/15 Dyspnea-->improved HCAP P:   Continue oral beta-lactam as you are doing, would plan 10 total days of antibiotic Will need outpatient f/u with pulmonary and a CXR in 2 weeks > Will see Tammy Parrett on 5/30 at 11:30  Atrial fibrillation P: Continue rate control Warfarin per pharmacy/cardiology  PCCM will sign off  Roselie Awkward, MD [facility] Pager:  [phone] Cell: [phone] After 3pm or if no response, call [phone]   05/31/2015 9:51 AM

## 2015-05-31 NOTE — Progress Notes (Signed)
ANTICOAGULATION CONSULT NOTE - follow up  Pharmacy Consult for Warfarin Indication: atrial fibrillation  Allergies  Allergen Reactions  . Arthrotec [Diclofenac-Misoprostol] Diarrhea  . Erythromycin     Anaphylaxis   . Morphine Sulfate Other (See Comments)    Erratic behavior; hallucinations  . Gold-Containing Drug Products Rash  . Macrodantin [Nitrofurantoin Macrocrystal] Rash  . Naproxen Rash  . Penicillins Rash    amoxicillin   Patient Measurements: Height: 5\' 3"  (160 cm) Weight: 146 lb (66.225 kg) IBW/kg (Calculated) : 52.4   Vital Signs: Temp: 97.7 F (36.5 C) (05/15 0536) Temp Source: Oral (05/15 0536) BP: 109/78 mmHg (05/15 0536) Pulse Rate: 80 (05/15 0536)  Labs:  Recent Labs  05/28/15 1210 05/28/15 1828 05/29/15 05/29/15 1105 05/30/15 0544 05/31/15 0506  HGB  --   --   --   --  11.7* 12.4  HCT  --   --   --   --  36.4 38.9  PLT  --   --   --   --  300 349  LABPROT  --   --   --  14.8 15.0 16.1*  INR  --   --   --  1.14 1.16 1.28  CREATININE  --   --   --  0.70  --   --   TROPONINI <0.03 <0.03 <0.03  --   --   --    Estimated Creatinine Clearance: 57.2 mL/min (by C-G formula based on Cr of 0.7).  Medical History: Past Medical History  Diagnosis Date  . Esophageal reflux   . Rheumatoid arthritis ([place]) 1975  . Osteopenia   . Polymorphic light eruption 2004  . Hyperlipidemia   . Adhesive capsulitis of left shoulder 1980  . Right middle ear infection 1986  . Acne rosacea   . Allergic rhinitis   . Hypercholesterolemia   . Atrial fibrillation ([place])   . Hypertension   . Edema   . Prolonged QT interval    Medications:  Scheduled:  . cefUROXime  500 mg Oral BID WC  . cholecalciferol  2,000 Units Oral Daily  . feeding supplement (ENSURE ENLIVE)  237 mL Oral BID BM  . folic acid  2 mg Oral Daily  . guaiFENesin  600 mg Oral BID  . metoprolol succinate  50 mg Oral Daily  . metoprolol succinate  50 mg Oral QPM  . multivitamin with minerals  1  tablet Oral Daily  . pantoprazole  40 mg Oral Daily  . pravastatin  20 mg Oral QPM  . predniSONE  20 mg Oral Q breakfast  . Warfarin - Pharmacist Dosing Inpatient   Does not apply q1800   Infusions:  . sodium chloride 10 mL/hr at 05/24/15 0014    Assessment: 35 yoF admitted on 05/23/2015 with c/o SOB and AMS. Chest CT of 5/7 showed moderate sized left pleural effusion w/ partial compression and atelectasis of LLL.  PTA warfarin for Afib was held prior to procedures.  She underwent thoracentesis on 5/8 and chest tube placement on 5/9 and removal on 5/12. Per Pulmonary, OK to resume warfarin for Afib on 5/13.  PTA warfarin dose was 5mg  daily except 2.5mg  on Thursdays (Last dose 5/7)  Today, 05/31/2015:  INR 1.28  after 2 doses Warfarin 6mg    CBC stable  No bleeding or complications reported  Drug-drug interaction:  Ceftin may elevate INR  Regular diet: eating 100% of meals + supplement daily  Goal of Therapy:  INR 2-3 Monitor platelets by anticoagulation protocol:  Yes   Plan:  1) Warfarin 7.5 mg today 2) Daily INR  Minda Ditto PharmD Pager [phone] 05/31/2015, 11:33 AM

## 2015-06-01 ENCOUNTER — Telehealth: Payer: Self-pay | Admitting: Interventional Cardiology

## 2015-06-01 ENCOUNTER — Ambulatory Visit: Payer: Medicare Other | Admitting: Interventional Cardiology

## 2015-06-01 LAB — PROTIME-INR
INR: 1.47 (ref 0.00–1.49)
Prothrombin Time: 17.9 seconds — ABNORMAL HIGH (ref 11.6–15.2)

## 2015-06-01 LAB — CBC
HCT: 38.5 % (ref 36.0–46.0)
HEMOGLOBIN: 12.3 g/dL (ref 12.0–15.0)
MCH: 29.9 pg (ref 26.0–34.0)
MCHC: 31.9 g/dL (ref 30.0–36.0)
MCV: 93.4 fL (ref 78.0–100.0)
Platelets: 339 10*3/uL (ref 150–400)
RBC: 4.12 MIL/uL (ref 3.87–5.11)
RDW: 16 % — ABNORMAL HIGH (ref 11.5–15.5)
WBC: 10 10*3/uL (ref 4.0–10.5)

## 2015-06-01 MED ORDER — LIP MEDEX EX OINT
TOPICAL_OINTMENT | CUTANEOUS | Status: AC
  Administered 2015-06-01: 05:00:00
  Filled 2015-06-01: qty 7

## 2015-06-01 MED ORDER — CEFUROXIME AXETIL 500 MG PO TABS: 500.0000 mg | ORAL_TABLET | Freq: Two times a day (BID) | ORAL | Status: DC

## 2015-06-01 MED ORDER — PREDNISONE 5 MG PO TABS: ORAL_TABLET | ORAL | Status: DC

## 2015-06-01 MED ORDER — METOPROLOL SUCCINATE ER 50 MG PO TB24: 50.0000 mg | ORAL_TABLET | Freq: Two times a day (BID) | ORAL | Status: DC

## 2015-06-01 MED ORDER — VERAPAMIL HCL 40 MG PO TABS: 40.0000 mg | ORAL_TABLET | Freq: Two times a day (BID) | ORAL | Status: DC

## 2015-06-01 MED ORDER — WARFARIN SODIUM 5 MG PO TABS: ORAL_TABLET | ORAL | Status: DC

## 2015-06-01 MED ORDER — VERAPAMIL HCL 40 MG PO TABS
40.0000 mg | ORAL_TABLET | Freq: Two times a day (BID) | ORAL | Status: DC
  Administered 2015-06-01: 40 mg via ORAL
  Filled 2015-06-01 (×2): qty 1

## 2015-06-01 MED ORDER — GUAIFENESIN ER 600 MG PO TB12: 600.0000 mg | ORAL_TABLET | Freq: Two times a day (BID) | ORAL | Status: DC

## 2015-06-01 MED ORDER — WARFARIN SODIUM 7.5 MG PO TABS
7.5000 mg | ORAL_TABLET | Freq: Once | ORAL | Status: AC
  Administered 2015-06-01: 7.5 mg via ORAL
  Filled 2015-06-01: qty 1

## 2015-06-01 MED ORDER — DILTIAZEM HCL 30 MG PO TABS: 30.0000 mg | ORAL_TABLET | Freq: Three times a day (TID) | ORAL | Status: DC

## 2015-06-01 NOTE — Progress Notes (Signed)
ANTICOAGULATION CONSULT NOTE - follow up  Pharmacy Consult for Warfarin Indication: atrial fibrillation  Allergies  Allergen Reactions  . Arthrotec [Diclofenac-Misoprostol] Diarrhea  . Erythromycin     Anaphylaxis   . Morphine Sulfate Other (See Comments)    Erratic behavior; hallucinations  . Gold-Containing Drug Products Rash  . Macrodantin [Nitrofurantoin Macrocrystal] Rash  . Naproxen Rash  . Penicillins Rash    amoxicillin   Patient Measurements: Height: 5\' 3"  (160 cm) Weight: 146 lb (66.225 kg) IBW/kg (Calculated) : 52.4   Vital Signs: Temp: 97.4 F (36.3 C) (05/16 0433) Temp Source: Oral (05/16 0433) BP: 110/73 mmHg (05/16 0433) Pulse Rate: 98 (05/16 0433)  Labs:  Recent Labs  05/29/15 1105  05/30/15 0544 05/31/15 0506 06/01/15 0526  HGB  --   < > 11.7* 12.4 12.3  HCT  --   --  36.4 38.9 38.5  PLT  --   --  300 349 339  LABPROT 14.8  --  15.0 16.1* 17.9*  INR 1.14  --  1.16 1.28 1.47  CREATININE 0.70  --   --   --   --   < > = values in this interval not displayed. Estimated Creatinine Clearance: 57.2 mL/min (by C-G formula based on Cr of 0.7).  Medical History: Past Medical History  Diagnosis Date  . Esophageal reflux   . Rheumatoid arthritis ([place]) 1975  . Osteopenia   . Polymorphic light eruption 2004  . Hyperlipidemia   . Adhesive capsulitis of left shoulder 1980  . Right middle ear infection 1986  . Acne rosacea   . Allergic rhinitis   . Hypercholesterolemia   . Atrial fibrillation ([place])   . Hypertension   . Edema   . Prolonged QT interval    Medications:  Scheduled:  . cefUROXime  500 mg Oral BID WC  . cholecalciferol  2,000 Units Oral Daily  . diltiazem  30 mg Oral Q8H  . feeding supplement (ENSURE ENLIVE)  237 mL Oral BID BM  . folic acid  2 mg Oral Daily  . guaiFENesin  600 mg Oral BID  . metoprolol succinate  50 mg Oral Daily  . metoprolol succinate  50 mg Oral QPM  . multivitamin with minerals  1 tablet Oral Daily  .  pantoprazole  40 mg Oral Daily  . pravastatin  20 mg Oral QPM  . predniSONE  20 mg Oral Q breakfast  . Warfarin - Pharmacist Dosing Inpatient   Does not apply q1800   Infusions:  . sodium chloride 10 mL/hr at 05/24/15 0014    Assessment: 61 yoF admitted on 05/23/2015 with c/o SOB and AMS. Chest CT of 5/7 showed moderate sized left pleural effusion w/ partial compression and atelectasis of LLL.  PTA warfarin for Afib was held prior to procedures.  She underwent thoracentesis on 5/8 and chest tube placement on 5/9 and removal on 5/12. Per Pulmonary, OK to resume warfarin for Afib on 5/13.  PTA warfarin dose was 5mg  daily except 2.5mg  on Thursdays (Last dose 5/7)  Today, 06/01/2015:  INR 1.47, remains subtherapeutic, but now trending upward after boosted doses of warfarin  CBC stable  No bleeding or complications reported  Drug-drug interaction:  Ceftin may elevate INR  Regular diet  Goal of Therapy:  INR 2-3 Monitor platelets by anticoagulation protocol: Yes   Plan:  1) Warfarin 7.5 mg today prior to discharge.  2) At discharge, would repeat 7.5mg  PO x 1 again tomorrow (5/17), then decrease to 5mg  daily  starting 5/18 with close outpatient follow-up for INR check in 2-3 days.  3) Daily INR while inpatient. 4) Monitor for s/s of bleeding.   Lindell Spar, PharmD, BCPS Pager: [phone] 06/01/2015 9:37 AM

## 2015-06-01 NOTE — Telephone Encounter (Signed)
TCM per Kathlene November  5/23 @ 10am w/ Nicki Reaper

## 2015-06-01 NOTE — Discharge Instructions (Signed)
Please you need to have INR check in 2 days.

## 2015-06-01 NOTE — Progress Notes (Signed)
Physical Therapy Treatment Patient Details Name: [name] MRN: [id number] DOB: [birth year] Today's Date: 06/01/2015    History of Present Illness 57 y0 femail admitted through ED 05/23/15 with AMS, CP and SOB.  Pt dx wtih acute resp failure with hypoxia 2* pleural effusion.  Pt s/p multiple thoracnetesis including this am.  Pt with hx of RA and A-fib    PT Comments    Progressing well with mobility. Pt states she is set to d/c home on today.   Follow Up Recommendations  No PT follow up     Equipment Recommendations  None recommended by PT    Recommendations for Other Services       Precautions / Restrictions Precautions Precautions: Fall Restrictions Weight Bearing Restrictions: No    Mobility  Bed Mobility               General bed mobility comments: Pt OOB in recliner   Transfers     Transfers: Sit to/from Stand Sit to Stand: Modified independent (Device/Increase time)            Ambulation/Gait Ambulation/Gait assistance: Supervision Ambulation Distance (Feet): 250 Feet Assistive device: None Gait Pattern/deviations: Step-through pattern;Drifts right/left     General Gait Details: 1-2 slilght drifts noted on today. Overall, improved ambulation on today. HR range 95-125 bpm during session. Pt tolerated activity well.    Stairs            Wheelchair Mobility    Modified Rankin (Stroke Patients Only)       Balance                                    Cognition Arousal/Alertness: Awake/alert Behavior During Therapy: WFL for tasks assessed/performed Overall Cognitive Status: Within Functional Limits for tasks assessed                      Exercises      General Comments        Pertinent Vitals/Pain Pain Assessment: No/denies pain    Home Living                      Prior Function            PT Goals (current goals can now be found in the care plan section) Progress towards PT goals:  Progressing toward goals    Frequency  Min 3X/week    PT Plan Current plan remains appropriate    Co-evaluation             End of Session   Activity Tolerance: Patient tolerated treatment well Patient left: in chair;with call bell/phone within reach     Time: 0918-0926 PT Time Calculation (min) (ACUTE ONLY): 8 min  Charges:  $Gait Training: 8-22 mins                    G Codes:      Weston Anna, MPT Pager: [phone]

## 2015-06-01 NOTE — Discharge Summary (Signed)
Physician Discharge Summary  [name] [id number] DOB: [birth year] DOA: 05/23/2015  PCP: Lottie Dawson, MD  Admit date: 05/23/2015 Discharge date: 06/01/2015  Time spent: 35 minutes  Recommendations for Outpatient Follow-up:  Needs repeat Chest x ray to follow resolution of pleural effusion.  Follow up with cardiology for further adjustment of medications for A fib.  Needs INR and adjust coumadin as needed  Discharge Diagnoses:    Acute respiratory failure with hypoxia (HCC)   Exudative pleural effusion   Hypertension   Rheumatoid arthritis (HCC)   HCAP (healthcare-associated pneumonia)   Pleural effusion, left   Pleuritic chest pain   Acute respiratory failure (HCC)   Chest tube in place   Persistent atrial fibrillation ([place])   Long term (current) use of anticoagulants   Long QT interval   Discharge Condition: stable  Diet recommendation: heart healthy   Filed Weights   05/23/15 1931 05/23/15 2256  Weight: 66.225 kg (146 lb) 66.225 kg (146 lb)    History of present illness:  75 year old female with past medical history of chronic atrial fibrillation, rheumatoid arthritis and hypertension admitted on the night of 5/7 after several days of increased confusion. Patient had also been increasingly short of breath with complaints of left-sided pleuritic chest pain. Emergency room, lab work unremarkable and CT of the head was as well. CT scan of the chest noted moderate pleural effusion and patient noted to be hypoxic. Patient started on oxygen, IV antibiotics and pulmonary consulted.  With oxygenation, patient's mentation improved. Seen by pulmonary and patient underwent left-sided thoracentesis yielding a normal 700 mL's of bloody rust colored fluid. Pulmonary felt that still sig amount of fluid despite tap concerning for empyema. Patient S/P US guided left pleural drain, pigtail catheter by IR 5-09 Patient underwent drain removal 5-12. Chest x ray stable.  She develops A fib RVR 5-12. Metoprolol dose increased to 50 mg BID. Cardiology following.   Hospital Course:  Acute respiratory failure with hypoxia (HCC) secondary to left-sided exudative pleural effusion, presumed infectious-concerns for empyema. Continue antibiotics. Awaiting culture results. Pulmonary following Patient S/P US guided left pleural drain, pigtail catheter by IR 5-09.  Plan to repeat Chest x ray tomorrow,  Cytology negative for malignancy, pleural fluid no growth to date.  Chest x ray 5-13; decreased left side effusion.  Vancomycin dc by CCM.  Drain removed 5-12 Continue with Ceftin. will provide 2 more days  X ray stable, ok to discharge per CCM, follow chest x ray in 2 weeks.  10 days of antibiotics.  A fib; RVR Develops A fib RVR 5-12 Cardiology consulted.  Metoprolol increase to 50 mg BID.  Appreciate cardiology help/  Started on Cardizem, HR improved. Patient relates swelling with Cardizem in the past. She has tolerated verapamil. 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 Taper steroids. Change to prednisone 20,, taper to home dose over course of days.   Acute encephalopathy: Secondary to hypoxia. Resolved.   Procedures:  Status post thoracentesis of left lung done 5/8:675ml of fluid removed   Chest Tube planned 5/9  Consultations:  Pulmonary   Cardiology  IR    Discharge Exam: Filed Vitals:   05/31/15 2114 06/01/15 0433  BP: 106/56 110/73  Pulse: 95 98  Temp: 97.7 F (36.5 C) 97.4 F (36.3 C)  Resp: 18 18    General: NAD Cardiovascular: S 1, S 2 RRR Respiratory: CTA  Discharge Instructions   Discharge Instructions    Diet - low sodium heart healthy    Complete by:  As directed      Increase activity slowly    Complete by:  As directed           Current Discharge Medication List    START  taking these medications   Details  cefUROXime (CEFTIN) 500 MG tablet Take 1 tablet (500 mg total) by mouth 2 (two) times daily with a meal. Qty: 4 tablet, Refills: 0    guaiFENesin (MUCINEX) 600 MG 12 hr tablet Take 1 tablet (600 mg total) by mouth 2 (two) times daily. Qty: 30 tablet, Refills: 0    verapamil (CALAN) 40 MG tablet Take 1 tablet (40 mg total) by mouth every 12 (twelve) hours. Qty: 60 tablet, Refills: 3      CONTINUE these medications which have CHANGED   Details  metoprolol succinate (TOPROL-XL) 50 MG 24 hr tablet Take 1 tablet (50 mg total) by mouth 2 (two) times daily. Take with or immediately following a meal. Qty: 30 tablet, Refills: 0    predniSONE (DELTASONE) 5 MG tablet Take 2 tablet for 2 days then resume prior home dose of 2.5 mg daily Qty: 10 tablet, Refills: 0    warfarin (COUMADIN) 5 MG tablet Take 7.5 mg by mouth on 5-17 then take 5 mg daily Qty: 30 tablet, Refills: 0      CONTINUE these medications which have NOT CHANGED   Details  acidophilus (RISAQUAD) CAPS capsule Take 1 capsule by mouth daily.    cholecalciferol (VITAMIN D) 1000 UNITS tablet Take 2,000 Units by mouth daily.    fluticasone (FLONASE) 50 MCG/ACT nasal spray Place 2 sprays into both nostrils daily as needed for allergies or rhinitis.     folic acid (FOLVITE) 1 MG tablet Take 2 mg by mouth daily.     furosemide (LASIX) 40 MG tablet Take 20 mg by mouth daily.     hydroxypropyl methylcellulose / hypromellose (ISOPTO TEARS / GONIOVISC) 2.5 % ophthalmic solution Place 1 drop into both eyes 4 (four) times daily as needed for dry eyes.    methotrexate (RHEUMATREX) 2.5 MG tablet Take three (3) tablets (7.5 mg total) by mouth every Tuesday; Take two (2) tablets (5 mg total) by mouth every Wednesday. Refills: 1    Multiple Vitamins-Minerals (MULTIVITAMIN WITH MINERALS) tablet Take 1 tablet by mouth daily.    omeprazole (PRILOSEC) 20 MG capsule Take 20 mg by mouth every morning.      pravastatin (PRAVACHOL) 20 MG tablet TAKE 1 TABLET EVERY EVENING Qty: 30 tablet, Refills: 11    traMADol (ULTRAM) 50 MG tablet Take 1 tablet (50 mg total) by mouth every 6 (six) hours as needed for severe pain. Qty: 30 tablet, Refills: 0      STOP taking these medications     Etanercept (ENBREL [place])        Allergies  Allergen Reactions  . Arthrotec [Diclofenac-Misoprostol] Diarrhea  . Erythromycin     Anaphylaxis   . Morphine Sulfate Other (See Comments)    Erratic behavior; hallucinations  . Gold-Containing Drug Products Rash  . Macrodantin [Nitrofurantoin Macrocrystal] Rash  . Naproxen Rash  . Penicillins Rash    amoxicillin   Follow-up Information    Follow up with Rexene Edison, NP On 06/15/2015.   Specialty:  Pulmonary Disease   Why:  Follow up pleural fluid collection, will have CXR; appointment at 11:30   Contact information:   [id number]  [place] [address] [phone]       Follow up with Richardson Dopp, PA-C On 06/08/2015.   Specialties:  Cardiology, Physician Assistant   Why:  @ 10:30am   Contact information:   [id number] [place] [address] [phone]        The results of significant diagnostics from this hospitalization (including imaging, microbiology, ancillary and laboratory) are listed below for reference.    Significant Diagnostic Studies: Dg Chest 1 View  05/25/2015  CLINICAL DATA:  Chest tube, history hypertension, atrial fibrillation EXAM: CHEST 1 VIEW COMPARISON:  Portable exam 1716 hours compared to 05/24/2015 FINDINGS: New small caliber pigtail LEFT thoracostomy tube identified at lateral LEFT lung base. Enlargement of cardiac silhouette. Mediastinal contours and pulmonary vascularity normal. Bronchitic changes without acute infiltrate or failure. Decrease in small LEFT pleural effusion since previous exam. No pneumothorax. Bones demineralized. IMPRESSION: No pneumothorax following LEFT thoracostomy tube.  Enlargement of cardiac silhouette. Minimal bronchitic changes. Electronically Signed   By: Lavonia Dana M.D.   On: 05/25/2015 17:42   Dg Chest 2 View  05/31/2015  CLINICAL DATA:  Left-sided pleural effusion EXAM: CHEST  2 VIEW COMPARISON:  05/29/2015 FINDINGS: Cardiac shadow is enlarged. The right lung remains clear. The left lung shows stable pleural effusion. No underlying pneumothorax is noted. No bony abnormality is seen. IMPRESSION: Stable left pleural effusion.  No new focal abnormality is seen. Electronically Signed   By: Inez Catalina M.D.   On: 05/31/2015 08:19   Dg Chest 2 View  05/29/2015  CLINICAL DATA:  75 year old female with history of pleural effusion. Shortness of breath and chest pain. EXAM: CHEST  2 VIEW COMPARISON:  Chest x-ray 05/28/2015. FINDINGS: Small left pleural effusion appears to be decreasing. Decreasing atelectasis and/or consolidation in the left lung base. Right lung is clear. No right pleural effusion. No evidence of pulmonary edema. Heart size appears mildly enlarged. Upper mediastinal contours are within normal limits. IMPRESSION: 1. Decreasing small left pleural effusion with probable resolving subsegmental atelectasis in the left lower lobe. 2. Mild cardiomegaly. Electronically Signed   By: Vinnie Langton M.D.   On: 05/29/2015 13:20   Dg Chest 2 View  05/28/2015  CLINICAL DATA:  LEFT pleural effusion, atrial fibrillation, hypertension, shortness of breath at times EXAM: CHEST  2 VIEW COMPARISON:  05/27/2015 FINDINGS: Pigtail LEFT thoracostomy tube unchanged. Enlargement of cardiac silhouette. Mediastinal contours and pulmonary vascular normal. Persistent LEFT lung base density which could represent partially loculated pleural effusion. Coexistent atelectasis LEFT lower lobe. Remaining lungs clear. Central peribronchial thickening. No pneumothorax. IMPRESSION: Persistent LEFT basilar opacity question loculated pleural effusion atelectasis no underlying consolidation and  mass cannot be excluded with this appearance. Recommend continued followup until resolution. Electronically Signed   By: Lavonia Dana M.D.   On: 05/28/2015 09:40   Dg Chest 2 View  05/27/2015  CLINICAL DATA:  Severe shortness of breath, pneumonia, chest tube placement for excluded pleural effusion EXAM: CHEST  2 VIEW COMPARISON:  05/25/2015 FINDINGS: Small to moderate left pleural effusion. Left pigtail catheter. No pneumothorax. Cardiomegaly. IMPRESSION: Small moderate left pleural effusion.  Left pigtail catheter. Electronically Signed   By: Julian Hy M.D.   On: 05/27/2015 10:04   Dg Chest 2 View  05/23/2015  CLINICAL DATA:  Cough and fever EXAM: CHEST  2 VIEW COMPARISON:  April 27, 2015 FINDINGS: No pneumothorax. Stable cardiomegaly. Increasing effusion and opacity in the left base. Mild pulmonary venous congestion. No other acute abnormalities.  IMPRESSION: Increasing effusion and opacity in the left base. Recommend follow-up to resolution. Mild pulmonary venous congestion. Electronically Signed   By: Dorise Bullion III M.D   On: 05/23/2015 19:06   Ct Head Wo Contrast  05/23/2015  CLINICAL DATA:  Altered mental status. EXAM: CT HEAD WITHOUT CONTRAST TECHNIQUE: Contiguous axial images were obtained from the base of the skull through the vertex without intravenous contrast. COMPARISON:  None. FINDINGS: There is no intracranial hemorrhage, mass or evidence of acute infarction. There is mild generalized atrophy. There is mild chronic microvascular ischemic change. There is no significant extra-axial fluid collection. No acute intracranial findings are evident. The calvarium and skullbase are intact. Visible paranasal sinuses and orbits are unremarkable. IMPRESSION: No acute intracranial findings. There is mild atrophy and chronic appearing white matter hypodensity, likely due to small vessel disease. Electronically Signed   By: Andreas Newport M.D.   On: 05/23/2015 21:31   Ct Chest Wo  Contrast  05/24/2015  CLINICAL DATA:  History of atrial fibrillation, left side chest pain images of the thoracic inlet are unremarkable. Images of the thoracic inlet are unremarkable. Central airways are patent. Or EXAM: CT CHEST WITHOUT CONTRAST TECHNIQUE: Multidetector CT imaging of the chest was performed following the standard protocol without IV contrast. COMPARISON:  CT of the chest 05/23/2015 and CT of the chest 08/03/2014 FINDINGS: Images of the thoracic inlet are unremarkable. Central airways are patent. Heart size is stable. Small hiatal hernia again noted. There is no mediastinal or hilar adenopathy noted on this unenhanced scan. Again noted moderate size left pleural effusion. Stable consolidation with air bronchogram in left lower lobe posteriorly. This may be due to atelectasis or pneumonia. No definite mass is noted on this unenhanced scan. Again obstructive mass cannot be excluded. Follow-up to resolution is recommended. Persistent small infiltrate or atelectasis in lingula with some air bronchogram. Stable small left loculated diaphragmatic pleural effusion posteriorly. Trace pericardial effusion. The right lung is clear.  No pulmonary edema. Atherosclerotic calcifications of thoracic aorta again noted. No adrenal gland mass is noted in visualized upper abdomen. The visualized unenhanced pancreas is unremarkable. The visualized upper kidneys and liver are unremarkable. Visualized spleen is unremarkable. Sagittal images of the spine shows no destructive bony lesions. Stable degenerative changes thoracic spine. Sagittal view of the sternum is unremarkable. No destructive rib lesions are noted. IMPRESSION: 1. Again noted moderate left pleural effusion with left lower lobe posterior atelectasis or infiltrate. Underlying mass cannot be entirely excluded. Follow-up to resolution is recommended. Stable atelectasis or infiltrate in lingula with some air bronchogram. 2. No pulmonary edema. 3. No mediastinal  or hilar adenopathy.  Small hiatal hernia. 4. No adrenal gland mass is noted. 5. Degenerative changes thoracic spine. Electronically Signed   By: Lahoma Crocker M.D.   On: 05/24/2015 14:00   Ct Chest Wo Contrast  05/23/2015  CLINICAL DATA:  75 year old female with abnormal chest x-ray. Evaluate for pleural effusion. EXAM: CT CHEST WITHOUT CONTRAST TECHNIQUE: Multidetector CT imaging of the chest was performed following the standard protocol without IV contrast. COMPARISON:  Chest radiograph dated 05/23/2015 and CT dated 08/03/2014 FINDINGS: Evaluation of this exam is limited in the absence of intravenous contrast. Evaluation is also limited due to respiratory motion artifact. There is a moderate size left pleural effusion with mass effect and partial compressive atelectasis of the left lower lobe. Superimposed pneumonia or underlying obstructing mass are not excluded. There is also a small area of consolidative changes with air bronchogram involving the  lingula. The right lung is clear. There is no pleural effusion on the right. There is no pneumothorax. The central airways are patent. There is minimal atherosclerotic calcification of the thoracic aorta. The central pulmonary arteries appear grossly unremarkable on this noncontrast study. Top-normal cardiac size. No pericardial effusion. There is no hilar or mediastinal adenopathy. There is a small hiatal hernia. The thyroid gland is heterogeneous and somewhat atrophic. There is a small linear hypodensity in the right thyroid lobe. Ultrasound may provide better evaluation of the thyroid gland if clinically indicated. There is no axillary adenopathy. The chest wall soft tissues appear unremarkable. There is degenerative changes of the spine. No acute osseous pathology. The visualized upper abdomen appears unremarkable. IMPRESSION: Moderate-sized left pleural effusion with partial compressive atelectasis of the left lower lobe. Electronically Signed   By: Anner Crete M.D.   On: 05/23/2015 22:25   Dg Chest Port 1 View  05/28/2015  CLINICAL DATA:  Exit pleural effusion, removal of chest to EXAM: PORTABLE CHEST 1 VIEW COMPARISON:  05/28/2015 FINDINGS: Small left pleural effusion. Associated left lower lobe opacity, likely atelectasis. Right lung is clear. No pneumothorax. Interval removal of left chest tube. Cardiomegaly. IMPRESSION: Interval removal of left chest tube/ pleural drain. No pneumothorax. Small left pleural effusion. Associated left lower lobe opacity, likely atelectasis. Electronically Signed   By: Julian Hy M.D.   On: 05/28/2015 16:09   Dg Chest Port 1 View  05/24/2015  CLINICAL DATA:  Status post left thoracentesis today. Postprocedural examination. EXAM: PORTABLE CHEST 1 VIEW COMPARISON:  CT chest and PA and lateral chest 05/23/2015. FINDINGS: Left pleural effusion seen on the comparison studies is decreased in size. There is no pneumothorax. Left basilar airspace disease is noted. The right lung appears clear. IMPRESSION: Decreased left effusion after thoracentesis. Negative for pneumothorax. Electronically Signed   By: Inge Rise M.D.   On: 05/24/2015 12:58   [place] Image Guided Drainage By Percutaneous Catheter  05/25/2015  INDICATION: 75 year old female with a history of persistent left pleural effusion after previous thoracentesis. EXAM: [place] IMAGE GUIDED DRAINAGE BY PERCUTANEOUS CATHETE MEDICATIONS: The patient is currently admitted to the hospital and receiving intravenous antibiotics. The antibiotics were administered within an appropriate time frame prior to the initiation of the procedure. ANESTHESIA/SEDATION: None None The patient was continuously monitored during the procedure by the interventional radiology nurse under my direct supervision. COMPLICATIONS: None PROCEDURE: Informed written consent was obtained from the patient after a thorough discussion of the procedural risks, benefits and alternatives. All questions were  addressed. Maximal Sterile Barrier Technique was utilized including caps, mask, sterile gowns, sterile gloves, sterile drape, hand hygiene and skin antiseptic. A timeout was performed prior to the initiation of the procedure. Patient is position in a seated position on a stretcher in the ultrasound suite, and ultrasound survey of the left posterior thorax was performed with images stored and sent to PACs. The patient was then prepped and draped in usual sterile fashion using chlorhexidine prep. The skin and subcutaneous tissues were then generously infiltrated 1% lidocaine for local anesthesia. A small stab incision was made with an 11 blade scalpel, and then trocar technique was used to advance a 10 [place] drain into left pleural effusion. Catheter was advanced off the trocar, pigtail was formed, and aspiration of serosanguineous fluid was achieved. Catheter was sutured in position and attached to a atrium water seal for drainage. Final ultrasound image was stored. Sterile dressing was applied. The patient tolerated the procedure well and remained  hemodynamically stable throughout. No complications were encountered and no significant blood loss. IMPRESSION: Status post ultrasound-guided left pleural drainage with 10 French pigtail drain catheter placed, and attached to water seal chamber. Signed, Dulcy Fanny. Earleen Newport, DO [facility] Electronically Signed   By: Corrie Mckusick D.O.   On: 05/25/2015 17:40    Microbiology: Recent Results (from the past 240 hour(s))  Culture, blood (Routine x 2)     Status: None   Collection Time: 05/23/15  7:08 PM  Result Value Ref Range Status   Specimen Description BLOOD LEFT FOREARM  Final   Special Requests BOTTLES DRAWN AEROBIC AND ANAEROBIC 10CC EA  Final   Culture   Final    NO GROWTH 5 DAYS Performed at [facility]    Report Status 05/29/2015 FINAL  Final  Urine culture     Status: None   Collection  Time: 05/23/15  7:41 PM  Result Value Ref Range Status   Specimen Description URINE, CATHETERIZED  Final   Special Requests NONE  Final   Culture   Final    NO GROWTH 1 DAY Performed at [facility]    Report Status 05/25/2015 FINAL  Final  Body fluid culture     Status: None   Collection Time: 05/24/15 12:20 PM  Result Value Ref Range Status   Specimen Description PLEURAL  Final   Special Requests Normal  Final   Gram Stain   Final    ABUNDANT WBC PRESENT,BOTH PMN AND MONONUCLEAR NO ORGANISMS SEEN    Culture   Final    NO GROWTH 3 DAYS Performed at [facility]    Report Status 05/28/2015 FINAL  Final  Body fluid culture     Status: None   Collection Time: 05/28/15 11:46 AM  Result Value Ref Range Status   Specimen Description THORACENTESIS  Final   Special Requests Normal  Final   Gram Stain   Final    ABUNDANT WBC PRESENT,BOTH PMN AND MONONUCLEAR NO ORGANISMS SEEN    Culture   Final    NO GROWTH 3 DAYS Performed at [facility]    Report Status 05/31/2015 FINAL  Final     Labs: Basic Metabolic Panel:  Recent Labs Lab 05/27/15 0521 05/29/15 1105  NA 145 142  K 4.1 3.8  CL 109 106  CO2 26 29  GLUCOSE 146* 105*  BUN 17 16  CREATININE 0.59 0.70  CALCIUM 9.0 9.1  MG  --  2.1   Liver Function Tests: No results for input(s): AST, ALT, ALKPHOS, BILITOT, PROT, ALBUMIN in the last 168 hours. No results for input(s): LIPASE, AMYLASE in the last 168 hours. No results for input(s): AMMONIA in the last 168 hours. CBC:  Recent Labs Lab 05/27/15 0521 05/30/15 0544 05/31/15 0506 06/01/15 0526  WBC 8.2 8.7 10.4 10.0  HGB 11.4* 11.7* 12.4 12.3  HCT 35.0* 36.4 38.9 38.5  MCV 93.3 93.6 93.7 93.4  PLT 272 300 349 339   Cardiac Enzymes:  Recent Labs Lab 05/28/15 1210 05/28/15 1828 05/29/15  TROPONINI <0.03 <0.03 <0.03   BNP: BNP (last 3 results) No results for input(s): BNP in the last 8760 hours.  ProBNP (last 3 results) No  results for input(s): PROBNP in the last 8760 hours.  CBG: No results for input(s): GLUCAP in the last 168 hours.     Signed:  Elmarie Shiley MD.  [facility] 06/01/2015, 2:49 PM

## 2015-06-01 NOTE — Progress Notes (Signed)
Patient Name: [name] Date of Encounter: 06/01/2015     Principal Problem:   Acute respiratory failure with hypoxia ([place]) Active Problems:   Hypertension   Rheumatoid arthritis ([place])   HCAP (healthcare-associated pneumonia)   Pleural effusion, left   Pleuritic chest pain   Acute respiratory failure ([facility])   Exudative pleural effusion   Chest tube in place   Persistent atrial fibrillation ([place])   Long term (current) use of anticoagulants   Long QT interval    SUBJECTIVE  No CP or SOB. No complaints. Ready to go home  CURRENT MEDS . cefUROXime  500 mg Oral BID WC  . cholecalciferol  2,000 Units Oral Daily  . diltiazem  30 mg Oral Q8H  . feeding supplement (ENSURE ENLIVE)  237 mL Oral BID BM  . folic acid  2 mg Oral Daily  . guaiFENesin  600 mg Oral BID  . metoprolol succinate  50 mg Oral Daily  . metoprolol succinate  50 mg Oral QPM  . multivitamin with minerals  1 tablet Oral Daily  . pantoprazole  40 mg Oral Daily  . pravastatin  20 mg Oral QPM  . predniSONE  20 mg Oral Q breakfast  . Warfarin - Pharmacist Dosing Inpatient   Does not apply q1800    OBJECTIVE  Filed Vitals:   05/31/15 0536 05/31/15 1435 05/31/15 2114 06/01/15 0433  BP: 109/78 104/62 106/56 110/73  Pulse: 80 109 95 98  Temp: 97.7 F (36.5 C) 97.8 F (36.6 C) 97.7 F (36.5 C) 97.4 F (36.3 C)  TempSrc: Oral Oral Oral Oral  Resp: 18 18 18 18   Height:      Weight:      SpO2: 99% 97% 99% 99%    Intake/Output Summary (Last 24 hours) at 06/01/15 0830 Last data filed at 05/31/15 2111  Gross per 24 hour  Intake    120 ml  Output      0 ml  Net    120 ml   Filed Weights   05/23/15 1931 05/23/15 2256  Weight: 146 lb (66.225 kg) 146 lb (66.225 kg)    PHYSICAL EXAM  General: Pleasant, NAD. Neuro: Alert and oriented X 3. Moves all extremities spontaneously. Psych: Normal affect. HEENT:  Normal  Neck: Supple without bruits or JVD. Lungs:  Resp regular and unlabored,  CTA. Heart: irreg irreg. no s3, s4, or murmurs. Abdomen: Soft, non-tender, non-distended, BS + x 4.  Extremities: No clubbing, cyanosis or edema. DP/PT/Radials 2+ and equal bilaterally.  Accessory Clinical Findings  CBC  Recent Labs  05/31/15 0506 06/01/15 0526  WBC 10.4 10.0  HGB 12.4 12.3  HCT 38.9 38.5  MCV 93.7 93.4  PLT 349 [id number]   Basic Metabolic Panel  Recent Labs  05/29/15 1105  NA 142  K 3.8  CL 106  CO2 29  GLUCOSE 105*  BUN 16  CREATININE 0.70  CALCIUM 9.1  MG 2.1    TELE  afib with good rate control HR in 80s. Some brady overnight with up to 2.4 sec pauses  Radiology/Studies  Dg Chest 1 View  05/25/2015  CLINICAL DATA:  Chest tube, history hypertension, atrial fibrillation EXAM: CHEST 1 VIEW COMPARISON:  Portable exam 1716 hours compared to 05/24/2015 FINDINGS: New small caliber pigtail LEFT thoracostomy tube identified at lateral LEFT lung base. Enlargement of cardiac silhouette. Mediastinal contours and pulmonary vascularity normal. Bronchitic changes without acute infiltrate or failure. Decrease in small LEFT pleural effusion since previous exam. No pneumothorax.  Bones demineralized. IMPRESSION: No pneumothorax following LEFT thoracostomy tube. Enlargement of cardiac silhouette. Minimal bronchitic changes. Electronically Signed   By: Lavonia Dana M.D.   On: 05/25/2015 17:42   Dg Chest 2 View  05/31/2015  CLINICAL DATA:  Left-sided pleural effusion EXAM: CHEST  2 VIEW COMPARISON:  05/29/2015 FINDINGS: Cardiac shadow is enlarged. The right lung remains clear. The left lung shows stable pleural effusion. No underlying pneumothorax is noted. No bony abnormality is seen. IMPRESSION: Stable left pleural effusion.  No new focal abnormality is seen. Electronically Signed   By: Inez Catalina M.D.   On: 05/31/2015 08:19   Dg Chest 2 View  05/29/2015  CLINICAL DATA:  75 year old female with history of pleural effusion. Shortness of breath and chest pain. EXAM: CHEST  2  VIEW COMPARISON:  Chest x-ray 05/28/2015. FINDINGS: Small left pleural effusion appears to be decreasing. Decreasing atelectasis and/or consolidation in the left lung base. Right lung is clear. No right pleural effusion. No evidence of pulmonary edema. Heart size appears mildly enlarged. Upper mediastinal contours are within normal limits. IMPRESSION: 1. Decreasing small left pleural effusion with probable resolving subsegmental atelectasis in the left lower lobe. 2. Mild cardiomegaly. Electronically Signed   By: Vinnie Langton M.D.   On: 05/29/2015 13:20   Dg Chest 2 View  05/28/2015  CLINICAL DATA:  LEFT pleural effusion, atrial fibrillation, hypertension, shortness of breath at times EXAM: CHEST  2 VIEW COMPARISON:  05/27/2015 FINDINGS: Pigtail LEFT thoracostomy tube unchanged. Enlargement of cardiac silhouette. Mediastinal contours and pulmonary vascular normal. Persistent LEFT lung base density which could represent partially loculated pleural effusion. Coexistent atelectasis LEFT lower lobe. Remaining lungs clear. Central peribronchial thickening. No pneumothorax. IMPRESSION: Persistent LEFT basilar opacity question loculated pleural effusion atelectasis no underlying consolidation and mass cannot be excluded with this appearance. Recommend continued followup until resolution. Electronically Signed   By: Lavonia Dana M.D.   On: 05/28/2015 09:40   Dg Chest 2 View  05/27/2015  CLINICAL DATA:  Severe shortness of breath, pneumonia, chest tube placement for excluded pleural effusion EXAM: CHEST  2 VIEW COMPARISON:  05/25/2015 FINDINGS: Small to moderate left pleural effusion. Left pigtail catheter. No pneumothorax. Cardiomegaly. IMPRESSION: Small moderate left pleural effusion.  Left pigtail catheter. Electronically Signed   By: Julian Hy M.D.   On: 05/27/2015 10:04   Dg Chest 2 View  05/23/2015  CLINICAL DATA:  Cough and fever EXAM: CHEST  2 VIEW COMPARISON:  April 27, 2015 FINDINGS: No  pneumothorax. Stable cardiomegaly. Increasing effusion and opacity in the left base. Mild pulmonary venous congestion. No other acute abnormalities. IMPRESSION: Increasing effusion and opacity in the left base. Recommend follow-up to resolution. Mild pulmonary venous congestion. Electronically Signed   By: Dorise Bullion III M.D   On: 05/23/2015 19:06   Ct Head Wo Contrast  05/23/2015  CLINICAL DATA:  Altered mental status. EXAM: CT HEAD WITHOUT CONTRAST TECHNIQUE: Contiguous axial images were obtained from the base of the skull through the vertex without intravenous contrast. COMPARISON:  None. FINDINGS: There is no intracranial hemorrhage, mass or evidence of acute infarction. There is mild generalized atrophy. There is mild chronic microvascular ischemic change. There is no significant extra-axial fluid collection. No acute intracranial findings are evident. The calvarium and skullbase are intact. Visible paranasal sinuses and orbits are unremarkable. IMPRESSION: No acute intracranial findings. There is mild atrophy and chronic appearing white matter hypodensity, likely due to small vessel disease. Electronically Signed   By: Valerie Roys.D.  On: 05/23/2015 21:31   Ct Chest Wo Contrast  05/24/2015  CLINICAL DATA:  History of atrial fibrillation, left side chest pain images of the thoracic inlet are unremarkable. Images of the thoracic inlet are unremarkable. Central airways are patent. Or EXAM: CT CHEST WITHOUT CONTRAST TECHNIQUE: Multidetector CT imaging of the chest was performed following the standard protocol without IV contrast. COMPARISON:  CT of the chest 05/23/2015 and CT of the chest 08/03/2014 FINDINGS: Images of the thoracic inlet are unremarkable. Central airways are patent. Heart size is stable. Small hiatal hernia again noted. There is no mediastinal or hilar adenopathy noted on this unenhanced scan. Again noted moderate size left pleural effusion. Stable consolidation with air  bronchogram in left lower lobe posteriorly. This may be due to atelectasis or pneumonia. No definite mass is noted on this unenhanced scan. Again obstructive mass cannot be excluded. Follow-up to resolution is recommended. Persistent small infiltrate or atelectasis in lingula with some air bronchogram. Stable small left loculated diaphragmatic pleural effusion posteriorly. Trace pericardial effusion. The right lung is clear.  No pulmonary edema. Atherosclerotic calcifications of thoracic aorta again noted. No adrenal gland mass is noted in visualized upper abdomen. The visualized unenhanced pancreas is unremarkable. The visualized upper kidneys and liver are unremarkable. Visualized spleen is unremarkable. Sagittal images of the spine shows no destructive bony lesions. Stable degenerative changes thoracic spine. Sagittal view of the sternum is unremarkable. No destructive rib lesions are noted. IMPRESSION: 1. Again noted moderate left pleural effusion with left lower lobe posterior atelectasis or infiltrate. Underlying mass cannot be entirely excluded. Follow-up to resolution is recommended. Stable atelectasis or infiltrate in lingula with some air bronchogram. 2. No pulmonary edema. 3. No mediastinal or hilar adenopathy.  Small hiatal hernia. 4. No adrenal gland mass is noted. 5. Degenerative changes thoracic spine. Electronically Signed   By: Lahoma Crocker M.D.   On: 05/24/2015 14:00   Ct Chest Wo Contrast  05/23/2015  CLINICAL DATA:  75 year old female with abnormal chest x-ray. Evaluate for pleural effusion. EXAM: CT CHEST WITHOUT CONTRAST TECHNIQUE: Multidetector CT imaging of the chest was performed following the standard protocol without IV contrast. COMPARISON:  Chest radiograph dated 05/23/2015 and CT dated 08/03/2014 FINDINGS: Evaluation of this exam is limited in the absence of intravenous contrast. Evaluation is also limited due to respiratory motion artifact. There is a moderate size left pleural  effusion with mass effect and partial compressive atelectasis of the left lower lobe. Superimposed pneumonia or underlying obstructing mass are not excluded. There is also a small area of consolidative changes with air bronchogram involving the lingula. The right lung is clear. There is no pleural effusion on the right. There is no pneumothorax. The central airways are patent. There is minimal atherosclerotic calcification of the thoracic aorta. The central pulmonary arteries appear grossly unremarkable on this noncontrast study. Top-normal cardiac size. No pericardial effusion. There is no hilar or mediastinal adenopathy. There is a small hiatal hernia. The thyroid gland is heterogeneous and somewhat atrophic. There is a small linear hypodensity in the right thyroid lobe. Ultrasound may provide better evaluation of the thyroid gland if clinically indicated. There is no axillary adenopathy. The chest wall soft tissues appear unremarkable. There is degenerative changes of the spine. No acute osseous pathology. The visualized upper abdomen appears unremarkable. IMPRESSION: Moderate-sized left pleural effusion with partial compressive atelectasis of the left lower lobe. Electronically Signed   By: Anner Crete M.D.   On: 05/23/2015 22:25   Dg Chest [facility]  1 View  05/28/2015  CLINICAL DATA:  Exit pleural effusion, removal of chest to EXAM: PORTABLE CHEST 1 VIEW COMPARISON:  05/28/2015 FINDINGS: Small left pleural effusion. Associated left lower lobe opacity, likely atelectasis. Right lung is clear. No pneumothorax. Interval removal of left chest tube. Cardiomegaly. IMPRESSION: Interval removal of left chest tube/ pleural drain. No pneumothorax. Small left pleural effusion. Associated left lower lobe opacity, likely atelectasis. Electronically Signed   By: Julian Hy M.D.   On: 05/28/2015 16:09   Dg Chest Port 1 View  05/24/2015  CLINICAL DATA:  Status post left thoracentesis today. Postprocedural  examination. EXAM: PORTABLE CHEST 1 VIEW COMPARISON:  CT chest and PA and lateral chest 05/23/2015. FINDINGS: Left pleural effusion seen on the comparison studies is decreased in size. There is no pneumothorax. Left basilar airspace disease is noted. The right lung appears clear. IMPRESSION: Decreased left effusion after thoracentesis. Negative for pneumothorax. Electronically Signed   By: Inge Rise M.D.   On: 05/24/2015 12:58   [place] Image Guided Drainage By Percutaneous Catheter  05/25/2015  INDICATION: 75 year old female with a history of persistent left pleural effusion after previous thoracentesis. EXAM: [place] IMAGE GUIDED DRAINAGE BY PERCUTANEOUS CATHETE MEDICATIONS: The patient is currently admitted to the hospital and receiving intravenous antibiotics. The antibiotics were administered within an appropriate time frame prior to the initiation of the procedure. ANESTHESIA/SEDATION: None None The patient was continuously monitored during the procedure by the interventional radiology nurse under my direct supervision. COMPLICATIONS: None PROCEDURE: Informed written consent was obtained from the patient after a thorough discussion of the procedural risks, benefits and alternatives. All questions were addressed. Maximal Sterile Barrier Technique was utilized including caps, mask, sterile gowns, sterile gloves, sterile drape, hand hygiene and skin antiseptic. A timeout was performed prior to the initiation of the procedure. Patient is position in a seated position on a stretcher in the ultrasound suite, and ultrasound survey of the left posterior thorax was performed with images stored and sent to PACs. The patient was then prepped and draped in usual sterile fashion using chlorhexidine prep. The skin and subcutaneous tissues were then generously infiltrated 1% lidocaine for local anesthesia. A small stab incision was made with an 11 blade scalpel, and then trocar technique was used to advance a 10 [place] drain  into left pleural effusion. Catheter was advanced off the trocar, pigtail was formed, and aspiration of serosanguineous fluid was achieved. Catheter was sutured in position and attached to a atrium water seal for drainage. Final ultrasound image was stored. Sterile dressing was applied. The patient tolerated the procedure well and remained hemodynamically stable throughout. No complications were encountered and no significant blood loss. IMPRESSION: Status post ultrasound-guided left pleural drainage with 10 French pigtail drain catheter placed, and attached to water seal chamber. Signed, Dulcy Fanny. Earleen Newport, DO [facility] Electronically Signed   By: Corrie Mckusick D.O.   On: 05/25/2015 17:40    ASSESSMENT AND PLAN 75 y/o female with a parapneumonic effusion s/p tube thorocastomy 05/25/15 - 05/28/15 developed RVR with her a fib.   1. Long term permanent atrial fibrillation (since 05/2014 at least) - dose of beta blocker was increased 05/28/15. HR remained elevated. Dilt 30mg  q 8 hrs added yesterday. HR under much better control currently. Some brady even with 2.44 second pause. Continue to follow.   2. Anticoagulation - going for a CXR this AM. CHADSVasc 3. resumed warfarin 05/29/15. INR 1.47 today  3. Mildly prolonged QT interval -  preferable to avoid macrolides and quinolones, or at least monitor QT frequently if these drugs are used.. No history of ventricular arrhythmia.  4. Rheumatoid arthritis on Enbrel and MTX. Second chest infection in less than 12 months. May need to reconsider use of immune modulating agents.  Dispo: home today when cleared from cardiology standpoint. I have made 1 week TOC appt with Richardson Dopp PA-C on 06/08/15 at 10:30am  Signed, Angelena Form PA-C  Pager [phone]  Patient examined chart reviewed rate control much better with cardizem.  Back on coumadin CXR better post Tube thoracoscopy.  Has outpatient f/u with  cards in a week  Jenkins Rouge

## 2015-06-02 NOTE — Telephone Encounter (Signed)
Patient contacted regarding discharge from [facility] on Jun 01, 2015.  Patient understands to follow up with provider Richardson Dopp, PA-C on Jun 08, 2015 at 10:30AM at [facility]. Patient understands discharge instructions? yes Patient understands medications and regiment? yes Patient understands to bring all medications to this visit? yes

## 2015-06-03 ENCOUNTER — Ambulatory Visit (INDEPENDENT_AMBULATORY_CARE_PROVIDER_SITE_OTHER): Payer: Medicare Other

## 2015-06-03 DIAGNOSIS — Z5181 Encounter for therapeutic drug level monitoring: Secondary | ICD-10-CM

## 2015-06-03 DIAGNOSIS — Z7901 Long term (current) use of anticoagulants: Secondary | ICD-10-CM | POA: Diagnosis not present

## 2015-06-03 DIAGNOSIS — I4891 Unspecified atrial fibrillation: Secondary | ICD-10-CM

## 2015-06-03 LAB — POCT INR: INR: 2.1

## 2015-06-07 NOTE — Progress Notes (Signed)
Cardiology Office Note:    Date:  06/08/2015   ID:  [name], DOB August 07, 1940, MRN [id number]  PCP:  Lottie Dawson, MD  Cardiologist:  Dr. Casandra Doffing   Electrophysiologist:  N/a Rheumatologist:  Dr. Trudie Reed  Referring MD: Ronnell Guadalajara*   Chief Complaint  Patient presents with  . Hospitalization Follow-up    AF with RVR in setting of pleural effusion    History of Present Illness:     [name] is a 75 y.o. female with a hx of chronic AF, RA, HTN, HL.  Last seen by Dr. Casandra Doffing in 8/16.  She is on Coumadin for anticoagulation.    She was admitted in 4/17 with chest pain. Echo demonstrated normal LVEF with mod pulmonary HTN (PASP 48 mmHg).  CEs remained neg.  No further ischemic workup was pursued.  Her beta-blocker was adjusted for uncontrolled rate.  She was seen by pulmonology with plans for OP evaluation with PFTs, sleep study and repeat Echo.  Readmitted 5/7-5/16 with symptoms of increasing confusion in the setting of increasing shortness of breath and left-sided pleuritic chest pain. CT scan demonstrated moderate pleural effusion with associated hypoxia. She underwent left-sided thoracentesis. Drain was left in for several days. Effusion was felt to be exudative and concerning for infectious-empyema. She was treated with antibiotics. Cytology was negative for malignancy. She was followed by critical care as well as cardiology. Rate was uncontrolled. Beta blocker dose was adjusted and she was placed on verapamil (history of intolerance to diltiazem with profound edema).  Returns for FU.  Here with her daughter.  Patient's husband is followed by Dr. Glori Bickers for advanced HF.  He was just DC after admit with ADCHF.  He lost 50 lbs with diuresis.  [name] is overall doing well. She remains short of breath and her L chest is still painful.  Bulky dressing was left on her back after CT removal.  She is not sure if she should leave it  on.  I removed it for her and the wound is well healed.  She is still sleeping sitting up.  She denies LE edema.  Denies syncope.    Past Medical History  Diagnosis Date  . Esophageal reflux   . Rheumatoid arthritis ([place]) 1975  . Osteopenia   . Polymorphic light eruption 2004  . Hyperlipidemia   . Adhesive capsulitis of left shoulder 1980  . Right middle ear infection 1986  . Acne rosacea   . Allergic rhinitis   . Hypercholesterolemia   . Atrial fibrillation ([place])   . Hypertension   . Edema   . Prolonged QT interval     Past Surgical History  Procedure Laterality Date  . Cardiac catheterization    . Cardioversion    . Temporomandibular joint surgery    . Abdominal hysterectomy      Current Medications: Outpatient Prescriptions Prior to Visit  Medication Sig Dispense Refill  . acidophilus (RISAQUAD) CAPS capsule Take 1 capsule by mouth daily.    . cholecalciferol (VITAMIN D) 1000 UNITS tablet Take 2,000 Units by mouth daily.    . fluticasone (FLONASE) 50 MCG/ACT nasal spray Place 2 sprays into both nostrils daily as needed for allergies or rhinitis.     . folic acid (FOLVITE) 1 MG tablet Take 2 mg by mouth daily.     . furosemide (LASIX) 40 MG tablet Take 20 mg by mouth daily.     Marland Kitchen guaiFENesin (MUCINEX) 600 MG 12 hr  tablet Take 1 tablet (600 mg total) by mouth 2 (two) times daily. 30 tablet 0  . methotrexate (RHEUMATREX) 2.5 MG tablet Take three (3) tablets (7.5 mg total) by mouth every Tuesday; Take two (2) tablets (5 mg total) by mouth every Wednesday.  1  . metoprolol succinate (TOPROL-XL) 50 MG 24 hr tablet Take 1 tablet (50 mg total) by mouth 2 (two) times daily. Take with or immediately following a meal. 30 tablet 0  . Multiple Vitamins-Minerals (MULTIVITAMIN WITH MINERALS) tablet Take 1 tablet by mouth daily.    Marland Kitchen omeprazole (PRILOSEC) 20 MG capsule Take 20 mg by mouth every morning.     . pravastatin (PRAVACHOL) 20 MG tablet TAKE 1 TABLET EVERY EVENING 30 tablet 11    . predniSONE (DELTASONE) 5 MG tablet Take 2 tablet for 2 days then resume prior home dose of 2.5 mg daily 10 tablet 0  . traMADol (ULTRAM) 50 MG tablet Take 1 tablet (50 mg total) by mouth every 6 (six) hours as needed for severe pain. 30 tablet 0  . verapamil (CALAN) 40 MG tablet Take 1 tablet (40 mg total) by mouth every 12 (twelve) hours. 60 tablet 3  . warfarin (COUMADIN) 5 MG tablet Take 7.5 mg by mouth on 5-17 then take 5 mg daily 30 tablet 0  . cefUROXime (CEFTIN) 500 MG tablet Take 1 tablet (500 mg total) by mouth 2 (two) times daily with a meal. (Patient not taking: Reported on 06/08/2015) 4 tablet 0  . hydroxypropyl methylcellulose / hypromellose (ISOPTO TEARS / GONIOVISC) 2.5 % ophthalmic solution Place 1 drop into both eyes 4 (four) times daily as needed for dry eyes. Reported on 06/08/2015     No facility-administered medications prior to visit.      Allergies:   Arthrotec; Erythromycin; Morphine sulfate; Gold-containing drug products; Macrodantin; Naproxen; and Penicillins   Social History   Social History  . Marital Status: Married    Spouse Name: N/A  . Number of Children: N/A  . Years of Education: N/A   Social History Main Topics  . Smoking status: Never Smoker   . Smokeless tobacco: Never Used  . Alcohol Use: No  . Drug Use: No  . Sexual Activity: Not Asked   Other Topics Concern  . None   Social History Narrative     Family History:  The patient's family history includes Healthy in her sister; Heart disease in her father, mother, sister, and sister; Other in her mother.   ROS:   Please see the history of present illness.    Review of Systems  Constitution: Positive for diaphoresis and weight loss.  Cardiovascular: Positive for dyspnea on exertion and irregular heartbeat.  Respiratory: Positive for cough and shortness of breath.   Hematologic/Lymphatic: Bruises/bleeds easily.   All other systems reviewed and are negative.   Physical Exam:    VS:  BP  108/70 mmHg  Pulse 90  Ht 5\' 3"  (1.6 m)  Wt 144 lb 3.2 oz (65.409 kg)  BMI 25.55 kg/m2  SpO2 95%   GEN: Well nourished, well developed, in no acute distress HEENT: normal Neck: no JVD, no masses Cardiac: Normal S1/S2, irreg irreg rhyhtm; no murmurs, rubs, or gallops, no edema   Respiratory:  Decreased breath sounds bilaterally; no wheezing, rhonchi or rales GI: soft, nontender, nondistended MS: no deformity or atrophy Skin: warm and dry Neuro: No focal deficits  Psych: Alert and oriented x 3, normal affect  Wt Readings from Last 3 Encounters:  06/08/15  144 lb 3.2 oz (65.409 kg)  05/23/15 146 lb (66.225 kg)  04/24/15 152 lb 5.4 oz (69.1 kg)      Studies/Labs Reviewed:     EKG:  EKG is  ordered today.  The ekg ordered today demonstrates AFib, HR 91, QTc 506 ms  Recent Labs: 04/24/2015: TSH 1.752 05/24/2015: ALT 20 05/29/2015: BUN 16; Creatinine, Ser 0.70; Magnesium 2.1; Potassium 3.8; Sodium 142 06/01/2015: Hemoglobin 12.3; Platelets 339   Recent Lipid Panel    Component Value Date/Time   CHOL 134 05/24/2015 1344   TRIG 148.0 05/21/2013 0846   HDL 36.60* 05/21/2013 0846   CHOLHDL 4 05/21/2013 0846   VLDL 29.6 05/21/2013 0846   LDLCALC 79 05/21/2013 0846    Additional studies/ records that were reviewed today include:   Echo 04/24/15 EF 55-60%, no RWMA, mild MR, mild LAE, PASP 48 mmHg, trivial effusion post to heart  LHC 3/12 LM patent LCx normal LAD normal RCA irregs Normal LVEF  Myoview 2/09 No ischemia; low risk   ASSESSMENT:     1. Chronic atrial fibrillation ([place])   2. Exudative pleural effusion   3. Essential hypertension   4. Pulmonary hypertension ([place])   5. Long QT interval     PLAN:     In order of problems listed above:  1. Chronic AF - Rate is controlled.  BP too soft to advance rate controlling drugs further. Continue current dose of Metoprolol Succinate and Verapamil.   CHADS2-VASc=3 (female, age > 62, HTN).  Continue coumadin.  2.  Pleural effusion - s/p thoracentesis.  She had completed antibiotics.  There was residual L effusion on CXR in the hospital pre DC.  She FU with Rexene Edison, NP with pulmonary next week.  3. HTN - Controlled.   4. Pulmonary HTN - FU with Pulmonology as planned.    5. Long QT - Avoid QT prolonging drugs.   Medication Adjustments/Labs and Tests Ordered: Current medicines are reviewed at length with the patient today.  Concerns regarding medicines are outlined above.  Medication changes, Labs and Tests ordered today are outlined in the Patient Instructions noted below. Patient Instructions  Medication Instructions:  Your physician recommends that you continue on your current medications as directed. Please refer to the Current Medication list given to you today. Labwork: NONE Testing/Procedures: NONE Follow-Up: DR. VARANASI IN 3-4 WEEKS; I WILL HAVE LYNN, LPN FOR DR. VARANASI CALL YOU WITH AN APPT.  Any Other Special Instructions Will Be Listed Below (If Applicable). If you need a refill on your cardiac medications before your next appointment, please call your pharmacy.   Signed, Richardson Dopp, PA-C  06/08/2015 9:17 PM    [facility] [address], [place], [place]  [address] Phone: [phone]; Fax: [phone]

## 2015-06-08 ENCOUNTER — Ambulatory Visit (INDEPENDENT_AMBULATORY_CARE_PROVIDER_SITE_OTHER): Payer: Medicare Other | Admitting: Physician Assistant

## 2015-06-08 ENCOUNTER — Encounter: Payer: Self-pay | Admitting: Physician Assistant

## 2015-06-08 VITALS — BP 108/70 | HR 90 | Ht 63.0 in | Wt 144.2 lb

## 2015-06-08 DIAGNOSIS — I10 Essential (primary) hypertension: Secondary | ICD-10-CM | POA: Diagnosis not present

## 2015-06-08 DIAGNOSIS — I272 Other secondary pulmonary hypertension: Secondary | ICD-10-CM

## 2015-06-08 DIAGNOSIS — R9431 Abnormal electrocardiogram [ECG] [EKG]: Secondary | ICD-10-CM

## 2015-06-08 DIAGNOSIS — J90 Pleural effusion, not elsewhere classified: Secondary | ICD-10-CM

## 2015-06-08 DIAGNOSIS — I482 Chronic atrial fibrillation, unspecified: Secondary | ICD-10-CM

## 2015-06-08 DIAGNOSIS — I4581 Long QT syndrome: Secondary | ICD-10-CM

## 2015-06-08 NOTE — Patient Instructions (Addendum)
Medication Instructions:  Your physician recommends that you continue on your current medications as directed. Please refer to the Current Medication list given to you today. Labwork: NONE Testing/Procedures: NONE Follow-Up: DR. VARANASI IN 3-4 WEEKS; I WILL HAVE LYNN, LPN FOR DR. VARANASI CALL YOU WITH AN APPT.  Any Other Special Instructions Will Be Listed Below (If Applicable). If you need a refill on your cardiac medications before your next appointment, please call your pharmacy.

## 2015-06-08 NOTE — Addendum Note (Signed)
Addended byKathlen Mody, Nicki Reaper T on: 06/08/2015 09:30 PM   Modules accepted: Level of Service

## 2015-06-11 ENCOUNTER — Other Ambulatory Visit: Payer: Self-pay | Admitting: Interventional Cardiology

## 2015-06-15 ENCOUNTER — Ambulatory Visit (INDEPENDENT_AMBULATORY_CARE_PROVIDER_SITE_OTHER)
Admission: RE | Admit: 2015-06-15 | Discharge: 2015-06-15 | Disposition: A | Discharge status: Home / Self Care | Payer: Medicare Other | Source: Ambulatory Visit | Attending: Adult Health | Admitting: Adult Health

## 2015-06-15 ENCOUNTER — Encounter: Payer: Self-pay | Admitting: Adult Health

## 2015-06-15 ENCOUNTER — Ambulatory Visit (INDEPENDENT_AMBULATORY_CARE_PROVIDER_SITE_OTHER): Payer: Medicare Other | Admitting: Adult Health

## 2015-06-15 ENCOUNTER — Ambulatory Visit (INDEPENDENT_AMBULATORY_CARE_PROVIDER_SITE_OTHER): Payer: Medicare Other | Admitting: *Deleted

## 2015-06-15 VITALS — BP 118/70 | HR 68 | Temp 97.8°F | Ht 63.0 in | Wt 147.0 lb

## 2015-06-15 DIAGNOSIS — Z5181 Encounter for therapeutic drug level monitoring: Secondary | ICD-10-CM | POA: Diagnosis not present

## 2015-06-15 DIAGNOSIS — J189 Pneumonia, unspecified organism: Secondary | ICD-10-CM

## 2015-06-15 DIAGNOSIS — I482 Chronic atrial fibrillation, unspecified: Secondary | ICD-10-CM

## 2015-06-15 DIAGNOSIS — Z7901 Long term (current) use of anticoagulants: Secondary | ICD-10-CM | POA: Diagnosis not present

## 2015-06-15 DIAGNOSIS — I4891 Unspecified atrial fibrillation: Secondary | ICD-10-CM

## 2015-06-15 DIAGNOSIS — J90 Pleural effusion, not elsewhere classified: Secondary | ICD-10-CM | POA: Diagnosis not present

## 2015-06-15 LAB — POCT INR: INR: 2.2

## 2015-06-15 NOTE — Assessment & Plan Note (Signed)
Left Exudative Pleural effusion w/ HCAP -clinically improving w/ ABx and drainage  CXR is improved with minimal left left angle blunting.  Cont on current regimen .

## 2015-06-15 NOTE — Assessment & Plan Note (Signed)
Patient is clinically improving. Chest x-ray has also improved as well. Patient has had recurrent pneumonia. Would suspect is secondary to her immunocompromised state with ongoing Enbrel use.  Advise that this puts her at risk for recurrent PNA.  Patient's continue on her current regimen. Follow back here in office in 6 weeks.

## 2015-06-15 NOTE — Patient Instructions (Addendum)
Continue on current regimen .  Follow up with Dr. Lake Bells in 6 weeks and As needed

## 2015-06-15 NOTE — Progress Notes (Signed)
Subjective:    Patient ID: [name], female    DOB: 07-18-1940, 75 y.o.   MRN: [id number]  HPI 75 year old female never smoker seen for pulmonary consult during hospitalization 05/24/2015 for pleural effusion She has RA on Enbrel and MTX.    06/15/2015 [facility] follow up :  Patient returns for a post hospital follow-up. Patient was recently admitted with a left-sided pleural effusion. Patient was admitted with shortness of breath, hypoxia. She was noted to have a new moderate size left pleural effusion. She also has some initial confusion on admission. CT chest showed moderate left effusion with associated LLL atx /infiltrate . She was treated for healthcare associated pneumonia with concern for a possible developing empyema. She was treated with aggressive antibiotics. Patient underwent thoracentesis with 700 cc of bloody crust colored fluid removed. She has significant amount of residual pleural fluid. A left pleural drain, atrial catheter was placed. Cytology was negative for malignancy. Pleural fluid cultures were negative. She did develop atrial filled with RVR during her hospitalization. She was seen by cardiology and started on c. Diltiazem.Marland Kitchen 2-D echo showed an EF of 55-60%, left atrium with mild dilation. PAP  48.  Patient does have a history of rheumatoid arthritis. She was treated with a prednisone taper.. It was recommended for her to stop Enbrel . However, she did not stop this medication. She says it's the only thing that controls her arthritis. Since discharge. Patient says she is feeling much improved with decreased cough, shortness of breath.. She denies any fever, hemoptysis, orthopnea, PND, or increased leg swelling.  Past Medical History  Diagnosis Date  . Esophageal reflux   . Rheumatoid arthritis ([place]) 1975  . Osteopenia   . Polymorphic light eruption 2004  . Hyperlipidemia   . Adhesive capsulitis of left shoulder 1980  . Right middle ear infection 1986    . Acne rosacea   . Allergic rhinitis   . Hypercholesterolemia   . Atrial fibrillation ([place])   . Hypertension   . Edema   . Prolonged QT interval    Current Outpatient Prescriptions on File Prior to Visit  Medication Sig Dispense Refill  . acidophilus (RISAQUAD) CAPS capsule Take 1 capsule by mouth daily.    . cholecalciferol (VITAMIN D) 1000 UNITS tablet Take 2,000 Units by mouth daily.    Marland Kitchen etanercept (ENBREL) 25 MG injection Inject 25 mg into the skin once a week.    . fluticasone (FLONASE) 50 MCG/ACT nasal spray Place 2 sprays into both nostrils daily as needed for allergies or rhinitis.     . folic acid (FOLVITE) 1 MG tablet Take 2 mg by mouth daily.     . furosemide (LASIX) 40 MG tablet TAKE 1 TABLET BY MOUTH EVERY DAY OR AS DIRECTED 30 tablet 1  . guaiFENesin (MUCINEX) 600 MG 12 hr tablet Take 1 tablet (600 mg total) by mouth 2 (two) times daily. 30 tablet 0  . methotrexate (RHEUMATREX) 2.5 MG tablet Take three (3) tablets (7.5 mg total) by mouth every Tuesday; Take two (2) tablets (5 mg total) by mouth every Wednesday.  1  . metoprolol succinate (TOPROL-XL) 50 MG 24 hr tablet Take 1 tablet (50 mg total) by mouth 2 (two) times daily. Take with or immediately following a meal. 30 tablet 0  . Multiple Vitamins-Minerals (MULTIVITAMIN WITH MINERALS) tablet Take 1 tablet by mouth daily.    Marland Kitchen omeprazole (PRILOSEC) 20 MG capsule Take 20 mg by mouth every morning.     Marland Kitchen  potassium chloride SA (K-DUR,KLOR-CON) 20 MEQ tablet TAKE 1/2 TABLET (10 MEQ TOTAL ) BY MOUTH ONCE DAILY    . pravastatin (PRAVACHOL) 20 MG tablet TAKE 1 TABLET EVERY EVENING 30 tablet 11  . predniSONE (DELTASONE) 5 MG tablet Take 2 tablet for 2 days then resume prior home dose of 2.5 mg daily 10 tablet 0  . traMADol (ULTRAM) 50 MG tablet Take 1 tablet (50 mg total) by mouth every 6 (six) hours as needed for severe pain. 30 tablet 0  . verapamil (CALAN) 40 MG tablet Take 1 tablet (40 mg total) by mouth every 12 (twelve)  hours. 60 tablet 3  . warfarin (COUMADIN) 5 MG tablet Take 7.5 mg by mouth on 5-17 then take 5 mg daily 30 tablet 0   No current facility-administered medications on file prior to visit.       Review of Systems Constitutional:   No  weight loss, night sweats,  Fevers, chills,  +fatigue, or  lassitude.  HEENT:   No headaches,  Difficulty swallowing,  Tooth/dental problems, or  Sore throat,                No sneezing, itching, ear ache, nasal congestion, post nasal drip,   CV:  No chest pain,  Orthopnea, PND, swelling in lower extremities, anasarca, dizziness, palpitations, syncope.   GI  No heartburn, indigestion, abdominal pain, nausea, vomiting, diarrhea, change in bowel habits, loss of appetite, bloody stools.   Resp:   No chest wall deformity  Skin: no rash or lesions.  GU: no dysuria, change in color of urine, no urgency or frequency.  No flank pain, no hematuria   MS:  No joint pain or swelling.  No decreased range of motion.  No back pain.  Psych:  No change in mood or affect. No depression or anxiety.  No memory loss.         Objective:   Physical Exam Filed Vitals:   06/15/15 1139  BP: 118/70  Pulse: 68  Temp: 97.8 F (36.6 C)  TempSrc: Oral  Height: 5\' 3"  (1.6 m)  Weight: 147 lb (66.679 kg)  SpO2: 99%    GEN: A/Ox3; pleasant , NAD, elderly   HEENT:  [place]/AT,  EACs-clear, TMs-wnl, NOSE-clear, THROAT-clear, no lesions, no postnasal drip or exudate noted.   NECK:  Supple w/ fair ROM; no JVD; normal carotid impulses w/o bruits; no thyromegaly or nodules palpated; no lymphadenopathy.  RESP  Clear  P & A; w/o, wheezes/ rales/ or rhonchi.no accessory muscle use, no dullness to percussion  CARD:  RRR, no m/r/g  , no peripheral edema, pulses intact, no cyanosis or clubbing.  GI:   Soft & nt; nml bowel sounds; no organomegaly or masses detected.  Musco: Warm bil, no deformities or joint swelling noted.   Neuro: alert, no focal deficits noted.    Skin:  Warm, no lesions or rashes   Tammy Parrett NP-C  [facility]  06/15/2015       Assessment & Plan:

## 2015-06-16 ENCOUNTER — Telehealth: Payer: Self-pay | Admitting: Interventional Cardiology

## 2015-06-16 NOTE — Telephone Encounter (Signed)
This is a Dr. V pt.

## 2015-06-16 NOTE — Telephone Encounter (Signed)
[name] is returning your call .   Thanks

## 2015-06-16 NOTE — Progress Notes (Signed)
Reviewed, I agree with this plan of care 

## 2015-06-27 ENCOUNTER — Other Ambulatory Visit: Payer: Self-pay | Admitting: Interventional Cardiology

## 2015-07-01 NOTE — Progress Notes (Signed)
Cardiology Office Note:    Date:  07/02/2015   ID:  [name], DOB 04-08-1940, MRN [id number]  PCP:  Lottie Dawson, MD  Cardiologist: Dr. Casandra Doffing  Electrophysiologist: N/a Rheumatologist: Dr. Trudie Reed Pulmonology: Dr. Lake Bells  Referring MD: Ronnell Guadalajara*   Chief Complaint  Patient presents with  . Atrial Fibrillation    Follow up    History of Present Illness:     [name] is a 75 y.o. female with a hx of chronic AF, RA, HTN, HL.  She is on Coumadin for anticoagulation.   She was admitted in 4/17 with chest pain. Echo demonstrated normal LVEF with mod pulmonary HTN (PASP 48 mmHg). CEs remained neg. No further ischemic workup was pursued.  She was then readmitted 5/17 with HCAP with assoc moderate pleural effusion.  She underwent left-sided thoracentesis.  Effusion was felt to be exudative and concerning for infectious-empyema. Cytology was negative for malignancy. HR was uncontrolled and she was followed by Cardiology.  I saw her 06/08/15.  She returns for FU.  Here today with her granddaughter. Her husband who is followed by Dr. Haroldine Laws for advanced heart failure continues to do poorly. [name] overall feels better. Her breathing is improved. She denies significant chest discomfort. She continues to have some soreness in her chest from the chest tube. She denies syncope. Denies significant edema. Denies any bleeding issues.   Past Medical History  Diagnosis Date  . Esophageal reflux   . Rheumatoid arthritis ([place]) 1975  . Osteopenia   . Polymorphic light eruption 2004  . Hyperlipidemia   . Adhesive capsulitis of left shoulder 1980  . Right middle ear infection 1986  . Acne rosacea   . Allergic rhinitis   . Hypercholesterolemia   . Chronic atrial fibrillation ([place])     a. Coumadin anticoagulation - [facility] ([place])  . Hypertension   . Prolonged QT interval   . History of cardiac catheterization     a.  Myoview 2/09: no ischemia; low risk  //  b. [place] 3/12: normal coronary arteries  . History of echocardiogram     a. Echo 4/17:  EF 55-60%, no RWMA, mild MR, mild LAE, PASP 48 mmHg, trivial effusion post to heart  . Pulmonary HTN ([place])   . Pleural effusion associated with pulmonary infection     Admx 5/17 >> required L thoracentesis (cytology neg for malignancy)    Current Medications: Outpatient Prescriptions Prior to Visit  Medication Sig Dispense Refill  . acidophilus (RISAQUAD) CAPS capsule Take 1 capsule by mouth daily.    . cholecalciferol (VITAMIN D) 1000 UNITS tablet Take 2,000 Units by mouth daily.    Marland Kitchen etanercept (ENBREL) 25 MG injection Inject 25 mg into the skin once a week.    . fluticasone (FLONASE) 50 MCG/ACT nasal spray Place 2 sprays into both nostrils daily as needed for allergies or rhinitis.     . folic acid (FOLVITE) 1 MG tablet Take 2 mg by mouth daily.     . furosemide (LASIX) 40 MG tablet TAKE 1 TABLET BY MOUTH EVERY DAY OR AS DIRECTED 30 tablet 1  . methotrexate (RHEUMATREX) 2.5 MG tablet Take three (3) tablets (7.5 mg total) by mouth every Tuesday; Take two (2) tablets (5 mg total) by mouth every Wednesday.  1  . metoprolol succinate (TOPROL-XL) 50 MG 24 hr tablet Take 1 tablet (50 mg total) by mouth 2 (two) times daily. Take with or immediately following a meal. 30  tablet 0  . Multiple Vitamins-Minerals (MULTIVITAMIN WITH MINERALS) tablet Take 1 tablet by mouth daily.    Marland Kitchen omeprazole (PRILOSEC) 20 MG capsule Take 20 mg by mouth every morning.     . potassium chloride SA (K-DUR,KLOR-CON) 20 MEQ tablet TAKE 1/2 TABLET (10 MEQ TOTAL ) BY MOUTH ONCE DAILY    . pravastatin (PRAVACHOL) 20 MG tablet TAKE 1 TABLET BY MOUTH EVERY EVENING 30 tablet 11  . predniSONE (DELTASONE) 5 MG tablet Take 2 tablet for 2 days then resume prior home dose of 2.5 mg daily 10 tablet 0  . traMADol (ULTRAM) 50 MG tablet Take 1 tablet (50 mg total) by mouth every 6 (six) hours as needed for severe  pain. 30 tablet 0  . verapamil (CALAN) 40 MG tablet Take 1 tablet (40 mg total) by mouth every 12 (twelve) hours. 60 tablet 3  . warfarin (COUMADIN) 5 MG tablet Take 7.5 mg by mouth on 5-17 then take 5 mg daily 30 tablet 0  . guaiFENesin (MUCINEX) 600 MG 12 hr tablet Take 1 tablet (600 mg total) by mouth 2 (two) times daily. (Patient not taking: Reported on 07/02/2015) 30 tablet 0   No facility-administered medications prior to visit.      Allergies:   Arthrotec; Erythromycin; Morphine sulfate; Diltiazem hcl; Gold-containing drug products; Macrodantin; Naproxen; and Penicillins   Social History   Social History  . Marital Status: Married    Spouse Name: N/A  . Number of Children: N/A  . Years of Education: N/A   Social History Main Topics  . Smoking status: Never Smoker   . Smokeless tobacco: Never Used  . Alcohol Use: No  . Drug Use: No  . Sexual Activity: Not Asked   Other Topics Concern  . None   Social History Narrative     ROS:   Please see the history of present illness.    Review of Systems  Constitution: Positive for diaphoresis and weight loss.  Cardiovascular: Positive for dyspnea on exertion and irregular heartbeat.  Respiratory: Positive for shortness of breath.    All other systems reviewed and are negative.   Physical Exam:    VS:  BP 120/70 mmHg  Pulse 84  Ht 5\' 3"  (1.6 m)  Wt 147 lb (66.679 kg)  BMI 26.05 kg/m2   Physical Exam  Constitutional: She is oriented to person, place, and time. She appears well-developed and well-nourished.  HENT:  Head: Normocephalic.  Neck: Neck supple. No JVD present.  Cardiovascular: S1 normal and S2 normal.  An irregularly irregular rhythm present.  No murmur heard. Pulmonary/Chest: Effort normal and breath sounds normal. She has no wheezes. She has no rales.  Abdominal: Soft. There is no tenderness.  Musculoskeletal: She exhibits no edema.  Neurological: She is alert and oriented to person, place, and time.    Skin: Skin is warm and dry.    Wt Readings from Last 3 Encounters:  07/02/15 147 lb (66.679 kg)  06/15/15 147 lb (66.679 kg)  06/08/15 144 lb 3.2 oz (65.409 kg)      Studies/Labs Reviewed:     EKG:  EKG is  ordered today.  The ekg ordered today demonstrates Atrial fibrillation, HR 84, no change from prior tracing  Recent Labs: 04/24/2015: TSH 1.752 05/24/2015: ALT 20 05/29/2015: BUN 16; Creatinine, Ser 0.70; Magnesium 2.1; Potassium 3.8; Sodium 142 06/01/2015: Hemoglobin 12.3; Platelets 339   Recent Lipid Panel    Component Value Date/Time   CHOL 134 05/24/2015 1344   TRIG  148.0 05/21/2013 0846   HDL 36.60* 05/21/2013 0846   CHOLHDL 4 05/21/2013 0846   VLDL 29.6 05/21/2013 0846   LDLCALC 79 05/21/2013 0846    Additional studies/ records that were reviewed today include:   Echo 04/24/15 EF 55-60%, no RWMA, mild MR, mild LAE, PASP 48 mmHg, trivial effusion post to heart  LHC 3/12 LM patent LCx normal LAD normal RCA irregs Normal LVEF  Myoview 2/09 No ischemia; low risk   ASSESSMENT:     1. Chronic atrial fibrillation ([place])   2. Essential hypertension     PLAN:     In order of problems listed above:  1. Chronic AF - Mrs. Guin is doing well. She is recovering from her illness in 5/17 with HCAP and associated left pleural effusion. At that time, her heart rate was uncontrolled. Today, her rate is well controlled. Continue current dose of Metoprolol Succinate and Verapamil.She cannot tolerate Diltiazem due to significant swelling. CHADS2-VASc=3 (female, age > 40, HTN). Continue coumadin.  2. HTN - Controlled.  Continue current regimen which includes Lasix, Metoprolol Succinate, Verapamil.   Medication Adjustments/Labs and Tests Ordered: Current medicines are reviewed at length with the patient today.  Concerns regarding medicines are outlined above.  Medication changes, Labs and Tests ordered today are outlined in the Patient Instructions noted  below. Patient Instructions  Medication Instructions:  No changes.  See your medication list. Labwork: None today. Testing/Procedures: None  Follow-Up: Dr. Casandra Doffing in 3 months or sooner if you feel worse. Any Other Special Instructions Will Be Listed Below (If Applicable). If you need a refill on your cardiac medications before your next appointment, please call your pharmacy.    Signed, Richardson Dopp, PA-C  07/02/2015 11:59 AM    [facility] [address], [place], [place]  [address] Phone: [phone]; Fax: [phone]

## 2015-07-02 ENCOUNTER — Ambulatory Visit (INDEPENDENT_AMBULATORY_CARE_PROVIDER_SITE_OTHER): Payer: Medicare Other | Admitting: Physician Assistant

## 2015-07-02 ENCOUNTER — Encounter: Payer: Self-pay | Admitting: Physician Assistant

## 2015-07-02 VITALS — BP 120/70 | HR 84 | Ht 63.0 in | Wt 147.0 lb

## 2015-07-02 DIAGNOSIS — I482 Chronic atrial fibrillation, unspecified: Secondary | ICD-10-CM

## 2015-07-02 DIAGNOSIS — I10 Essential (primary) hypertension: Secondary | ICD-10-CM

## 2015-07-02 NOTE — Patient Instructions (Addendum)
Medication Instructions:  No changes.  See your medication list. Labwork: None today. Testing/Procedures: None  Follow-Up: Dr. Casandra Doffing in 3 months or sooner if you feel worse. Any Other Special Instructions Will Be Listed Below (If Applicable). If you need a refill on your cardiac medications before your next appointment, please call your pharmacy.

## 2015-07-13 ENCOUNTER — Ambulatory Visit (INDEPENDENT_AMBULATORY_CARE_PROVIDER_SITE_OTHER): Payer: Medicare Other | Admitting: *Deleted

## 2015-07-13 DIAGNOSIS — I482 Chronic atrial fibrillation, unspecified: Secondary | ICD-10-CM

## 2015-07-13 DIAGNOSIS — I4891 Unspecified atrial fibrillation: Secondary | ICD-10-CM

## 2015-07-13 DIAGNOSIS — Z7901 Long term (current) use of anticoagulants: Secondary | ICD-10-CM

## 2015-07-13 DIAGNOSIS — Z5181 Encounter for therapeutic drug level monitoring: Secondary | ICD-10-CM

## 2015-07-13 LAB — POCT INR: INR: 2.6

## 2015-07-22 ENCOUNTER — Ambulatory Visit: Payer: Medicare Other | Admitting: Pulmonary Disease

## 2015-07-30 ENCOUNTER — Other Ambulatory Visit: Payer: Self-pay | Admitting: Interventional Cardiology

## 2015-07-30 NOTE — Telephone Encounter (Signed)
Pharmacist is advised that at the pts hospital d/c on 06/01/15 her Metoprolol was increased to 50 mg BID. She verbalized understanding.

## 2015-07-30 NOTE — Telephone Encounter (Signed)
Follow-up     Pt c/o medication issue:  1. Name of Medication: Metoprolol  2. How are you currently taking this medication (dosage and times per day)? 50 mg po twice daily  3. Are you having a reaction (difficulty breathing--STAT)? no  4. What is your medication issue? The script on file from Dr. Irish Lack states once daily with Metoprolol 25 mg po daily, the pharmacy wants to make sure the pt is suppose to take the medication once daily.

## 2015-08-10 ENCOUNTER — Ambulatory Visit (INDEPENDENT_AMBULATORY_CARE_PROVIDER_SITE_OTHER): Payer: Medicare Other | Admitting: *Deleted

## 2015-08-10 DIAGNOSIS — Z7901 Long term (current) use of anticoagulants: Secondary | ICD-10-CM | POA: Diagnosis not present

## 2015-08-10 DIAGNOSIS — Z5181 Encounter for therapeutic drug level monitoring: Secondary | ICD-10-CM | POA: Diagnosis not present

## 2015-08-10 DIAGNOSIS — I4891 Unspecified atrial fibrillation: Secondary | ICD-10-CM

## 2015-08-10 LAB — POCT INR: INR: 2.6

## 2015-08-14 ENCOUNTER — Other Ambulatory Visit: Payer: Self-pay | Admitting: Interventional Cardiology

## 2015-08-26 ENCOUNTER — Other Ambulatory Visit: Payer: Self-pay | Admitting: Interventional Cardiology

## 2015-09-15 ENCOUNTER — Ambulatory Visit (INDEPENDENT_AMBULATORY_CARE_PROVIDER_SITE_OTHER): Payer: Medicare Other | Admitting: Pulmonary Disease

## 2015-09-15 ENCOUNTER — Telehealth: Payer: Self-pay | Admitting: Pulmonary Disease

## 2015-09-15 ENCOUNTER — Ambulatory Visit (INDEPENDENT_AMBULATORY_CARE_PROVIDER_SITE_OTHER)
Admission: RE | Admit: 2015-09-15 | Discharge: 2015-09-15 | Disposition: A | Discharge status: Home / Self Care | Payer: Medicare Other | Source: Ambulatory Visit | Attending: Pulmonary Disease | Admitting: Pulmonary Disease

## 2015-09-15 ENCOUNTER — Encounter: Payer: Self-pay | Admitting: Pulmonary Disease

## 2015-09-15 DIAGNOSIS — Z23 Encounter for immunization: Secondary | ICD-10-CM | POA: Diagnosis not present

## 2015-09-15 DIAGNOSIS — I272 Other secondary pulmonary hypertension: Secondary | ICD-10-CM

## 2015-09-15 DIAGNOSIS — J948 Other specified pleural conditions: Secondary | ICD-10-CM

## 2015-09-15 DIAGNOSIS — J90 Pleural effusion, not elsewhere classified: Secondary | ICD-10-CM

## 2015-09-15 DIAGNOSIS — J9601 Acute respiratory failure with hypoxia: Secondary | ICD-10-CM

## 2015-09-15 NOTE — Assessment & Plan Note (Signed)
An echocardiogram in April 2017 was suggestive of pulmonary hypertension with otherwise normal cardiac function. It's not clear to me why this was the case, I wonder if it's related to her pneumonia from around that time. Considering her underlying rheumatoid arthritis I don't think we should ignore this, but with her lack of respiratory complaints I question whether or not there really is pulmonary hypertension.  Plan: Repeat echocardiogram, if there is evidence of worsening pulmonary hypertension then consider right heart cath, if stable or the same then we'll hold off on that for now 6 minute walk today and again in 6 months If she develops respiratory symptoms then move towards right heart cath sooner rather than later

## 2015-09-15 NOTE — Assessment & Plan Note (Signed)
Based on today's physical exam it seems that the pleural effusion which was related to pneumonia has completely resolved. However, we'll get a chest x-ray to ensure complete radiographic resolution. I believe that this was due to her episode of pneumonia and was uncomplicated therefore no further imaging will be necessary after today's chest x-ray (assuming it's normal).

## 2015-09-15 NOTE — Progress Notes (Signed)
Subjective:    Patient ID: [name], female    DOB: 11/19/1940, 75 y.o.   MRN: [id number]  Synopsis: First seen by [name] pulmonary in May 2017 while hospitalized for a parapneumonic effusion. She been hospitalized for pneumonia one month prior. Multiple thoracenteses were performed on the left sided effusion which showed an exudate. Culture negative, cytology negative. An echocardiogram in 2017 was suggestive of pulmonary hypertension.  HPI Chief Complaint  Patient presents with  . Follow-up    pt doing well, c/o occasional chest discomfort- unsure if this is from atrial flutter.     [name] has been stressed out lately because her husband just fell and broke his hip recently.  She notes that she has been doing well however.   She says she has no respiratory complaints right now.  No dyspnea, no cough.  She says that sometimes it feels like "something flips" in her chest which she wonders may be related to the afib. In general she is doing well.  No leg swelling  Past Medical History:  Diagnosis Date  . Acne rosacea   . Adhesive capsulitis of left shoulder 1980  . Allergic rhinitis   . Chronic atrial fibrillation ([place])    a. Coumadin anticoagulation - [facility] ([place])  . Esophageal reflux   . History of cardiac catheterization    a. Myoview 2/09: no ischemia; low risk  //  b. [place] 3/12: normal coronary arteries  . History of echocardiogram    a. Echo 4/17:  EF 55-60%, no RWMA, mild MR, mild LAE, PASP 48 mmHg, trivial effusion post to heart  . Hypercholesterolemia   . Hyperlipidemia   . Hypertension   . Osteopenia   . Pleural effusion associated with pulmonary infection    Admx 5/17 >> required L thoracentesis (cytology neg for malignancy)   . Polymorphic light eruption 2004  . Prolonged QT interval   . Pulmonary HTN ([place])   . Rheumatoid arthritis ([place]) 1975  . Right middle ear infection 1986      Review of Systems  Constitutional: Negative for chills,  fatigue and fever.  HENT: Negative for postnasal drip, rhinorrhea and sore throat.   Respiratory: Negative for choking, shortness of breath and wheezing.   Cardiovascular: Negative for chest pain, palpitations and leg swelling.       Objective:   Physical Exam Vitals:   09/15/15 1107  BP: 124/76  Pulse: 79  SpO2: 99%  Weight: 150 lb 6.4 oz (68.2 kg)  Height: 5\' 3"  (1.6 m)   RA  Gen: well appearing HENT: OP clear, TM's clear, neck supple PULM: CTA B, normal percussion CV: RRR, no mgr, trace edema GI: BS+, soft, nontender Derm: no cyanosis or rash Psyche: normal mood and affect   Records from her visit in May 2017 reviewed were she saw her nurse practitioner for follow-up for her effusion. She was doing well at that visit May 2017 chest x-ray images personally reviewed showing cardiomegaly but no persistent effusion.    Assessment & Plan:  Pleural effusion, left Based on today's physical exam it seems that the pleural effusion which was related to pneumonia has completely resolved. However, we'll get a chest x-ray to ensure complete radiographic resolution. I believe that this was due to her episode of pneumonia and was uncomplicated therefore no further imaging will be necessary after today's chest x-ray (assuming it's normal).  Pulmonary hypertension ([place]) An echocardiogram in April 2017 was suggestive of pulmonary hypertension with otherwise normal  cardiac function. It's not clear to me why this was the case, I wonder if it's related to her pneumonia from around that time. Considering her underlying rheumatoid arthritis I don't think we should ignore this, but with her lack of respiratory complaints I question whether or not there really is pulmonary hypertension.  Plan: Repeat echocardiogram, if there is evidence of worsening pulmonary hypertension then consider right heart cath, if stable or the same then we'll hold off on that for now 6 minute walk today and again in 6  months If she develops respiratory symptoms then move towards right heart cath sooner rather than later    Current Outpatient Prescriptions:  .  acidophilus (RISAQUAD) CAPS capsule, Take 1 capsule by mouth daily., Disp: , Rfl:  .  cholecalciferol (VITAMIN D) 1000 UNITS tablet, Take 2,000 Units by mouth daily., Disp: , Rfl:  .  etanercept (ENBREL) 25 MG injection, Inject 25 mg into the skin once a week., Disp: , Rfl:  .  fluticasone (FLONASE) 50 MCG/ACT nasal spray, Place 2 sprays into both nostrils daily as needed for allergies or rhinitis. , Disp: , Rfl:  .  folic acid (FOLVITE) 1 MG tablet, Take 2 mg by mouth daily. , Disp: , Rfl:  .  furosemide (LASIX) 40 MG tablet, TAKE 1 TABLET BY MOUTH EVERY DAY OR AS DIRECTED, Disp: 30 tablet, Rfl: 10 .  methotrexate (RHEUMATREX) 2.5 MG tablet, Take three (3) tablets (7.5 mg total) by mouth every Tuesday; Take two (2) tablets (5 mg total) by mouth every Wednesday., Disp: , Rfl: 1 .  metoprolol succinate (TOPROL-XL) 50 MG 24 hr tablet, TAKE 1 TABLET BY MOUTH TWICE DAILY, TAKE WITH OR IMMEDIATELY FOLLOWING A MEAL, Disp: 60 tablet, Rfl: 5 .  Multiple Vitamins-Minerals (MULTIVITAMIN WITH MINERALS) tablet, Take 1 tablet by mouth daily., Disp: , Rfl:  .  omeprazole (PRILOSEC) 20 MG capsule, Take 20 mg by mouth every morning. , Disp: , Rfl:  .  potassium chloride SA (K-DUR,KLOR-CON) 20 MEQ tablet, TAKE 1/2 TABLET (10 MEQ TOTAL ) BY MOUTH ONCE DAILY, Disp: , Rfl:  .  pravastatin (PRAVACHOL) 20 MG tablet, TAKE 1 TABLET BY MOUTH EVERY EVENING, Disp: 30 tablet, Rfl: 11 .  predniSONE (DELTASONE) 5 MG tablet, Take 2 tablet for 2 days then resume prior home dose of 2.5 mg daily, Disp: 10 tablet, Rfl: 0 .  traMADol (ULTRAM) 50 MG tablet, Take 1 tablet (50 mg total) by mouth every 6 (six) hours as needed for severe pain., Disp: 30 tablet, Rfl: 0 .  verapamil (CALAN) 40 MG tablet, Take 1 tablet (40 mg total) by mouth every 12 (twelve) hours., Disp: 60 tablet, Rfl: 3 .   warfarin (COUMADIN) 5 MG tablet, Take 7.5 mg by mouth on 5-17 then take 5 mg daily, Disp: 30 tablet, Rfl: 0

## 2015-09-15 NOTE — Telephone Encounter (Signed)
now

## 2015-09-15 NOTE — Telephone Encounter (Signed)
Pt called back and she stated that she needs a note for when she is ready to go back to work.  Will send to BQ to make him aware.  BQ please advise. thanks  Pt is aware of her cxr results from today.

## 2015-09-15 NOTE — Patient Instructions (Signed)
We will call you with the results of the echocardiogram and chest x-ray Exercise regularly We will see you back in 6 months with a 6 min walk test

## 2015-09-15 NOTE — Telephone Encounter (Signed)
lmtcb for pt.  

## 2015-09-16 NOTE — Telephone Encounter (Signed)
Work excuse printed and left up front for patient. Pt aware and states she will be by to pick it up. Pt states she would like to return to on 09/27/15 due to get husband currently being admitted due to a fall.  Nothing further needed.

## 2015-09-22 ENCOUNTER — Telehealth (HOSPITAL_COMMUNITY): Payer: Self-pay | Admitting: Pulmonary Disease

## 2015-09-23 ENCOUNTER — Ambulatory Visit (INDEPENDENT_AMBULATORY_CARE_PROVIDER_SITE_OTHER): Payer: Medicare Other | Admitting: Pharmacist

## 2015-09-23 DIAGNOSIS — Z7901 Long term (current) use of anticoagulants: Secondary | ICD-10-CM

## 2015-09-23 DIAGNOSIS — I4891 Unspecified atrial fibrillation: Secondary | ICD-10-CM | POA: Diagnosis not present

## 2015-09-23 DIAGNOSIS — Z5181 Encounter for therapeutic drug level monitoring: Secondary | ICD-10-CM

## 2015-09-23 LAB — POCT INR: INR: 1.7

## 2015-09-23 NOTE — Telephone Encounter (Signed)
Close encounter 

## 2015-09-28 ENCOUNTER — Other Ambulatory Visit (HOSPITAL_COMMUNITY): Payer: Medicare Other

## 2015-09-30 ENCOUNTER — Other Ambulatory Visit: Payer: Self-pay | Admitting: Physician Assistant

## 2015-10-01 ENCOUNTER — Other Ambulatory Visit: Payer: Self-pay

## 2015-10-01 ENCOUNTER — Ambulatory Visit (HOSPITAL_COMMUNITY): Payer: Medicare Other | Attending: Cardiology

## 2015-10-01 DIAGNOSIS — I272 Other secondary pulmonary hypertension: Secondary | ICD-10-CM | POA: Insufficient documentation

## 2015-10-01 DIAGNOSIS — I340 Nonrheumatic mitral (valve) insufficiency: Secondary | ICD-10-CM | POA: Insufficient documentation

## 2015-10-01 MED ORDER — VERAPAMIL HCL 40 MG PO TABS: 40.0000 mg | ORAL_TABLET | Freq: Two times a day (BID) | ORAL | 6 refills | Status: DC

## 2015-10-05 ENCOUNTER — Telehealth: Payer: Self-pay | Admitting: Pulmonary Disease

## 2015-10-05 NOTE — Telephone Encounter (Signed)
Spoke with pt and gave Echo results. Nothing further needed.

## 2015-10-12 ENCOUNTER — Ambulatory Visit (INDEPENDENT_AMBULATORY_CARE_PROVIDER_SITE_OTHER): Payer: Medicare Other | Admitting: Interventional Cardiology

## 2015-10-12 ENCOUNTER — Encounter: Payer: Self-pay | Admitting: Interventional Cardiology

## 2015-10-12 VITALS — BP 110/70 | HR 70 | Ht 63.0 in | Wt 151.0 lb

## 2015-10-12 DIAGNOSIS — I4819 Other persistent atrial fibrillation: Secondary | ICD-10-CM

## 2015-10-12 DIAGNOSIS — R0609 Other forms of dyspnea: Secondary | ICD-10-CM | POA: Diagnosis not present

## 2015-10-12 DIAGNOSIS — I481 Persistent atrial fibrillation: Secondary | ICD-10-CM | POA: Diagnosis not present

## 2015-10-12 DIAGNOSIS — I10 Essential (primary) hypertension: Secondary | ICD-10-CM | POA: Diagnosis not present

## 2015-10-12 NOTE — Patient Instructions (Signed)
Medication Instructions:  Same-no changes  Labwork: None  Testing/Procedures: None  Follow-Up: Your physician wants you to follow-up in: 9 months. You will receive a reminder letter in the mail two months in advance. If you don't receive a letter, please call our office to schedule the follow-up appointment.   Any Other Special Instructions Will Be Listed Below (If Applicable).     If you need a refill on your cardiac medications before your next appointment, please call your pharmacy.   

## 2015-10-12 NOTE — Progress Notes (Signed)
Cardiology Office Note   Date:  10/12/2015   ID:  [name], [name] 1940-06-15, MRN [id number]  PCP:  Ileana Roup, MD    No chief complaint on file. AFib   Wt Readings from Last 3 Encounters:  10/12/15 151 lb (68.5 kg)  09/15/15 150 lb 6.4 oz (68.2 kg)  07/02/15 147 lb (66.7 kg)       History of Present Illness: [name] is a 75 y.o. female  with a hx of chronic AF, RA, HTN, HL.  She is on Coumadin for anticoagulation.   She was admitted in 4/17 with chest pain. Echo demonstrated normal LVEF with mod pulmonary HTN (PASP 48 mmHg). CEs remained neg. No further ischemic workup was pursued.  She was then readmitted 5/17 with HCAP with assoc moderate pleural effusion.  She underwent left-sided thoracentesis.  Effusion was felt to be exudative and concerning for infectious-empyema. Cytology was negative for malignancy.   She is feeling better but still has some DOE.  She still feels occasional palpitations, but they only last a few seconds and are manageable.  She was very pleased with the care she received at [facility].    Past Medical History:  Diagnosis Date  . Acne rosacea   . Adhesive capsulitis of left shoulder 1980  . Allergic rhinitis   . Chronic atrial fibrillation ([place])    a. Coumadin anticoagulation - [facility] ([place])  . Esophageal reflux   . History of cardiac catheterization    a. Myoview 2/09: no ischemia; low risk  //  b. [place] 3/12: normal coronary arteries  . History of echocardiogram    a. Echo 4/17:  EF 55-60%, no RWMA, mild MR, mild LAE, PASP 48 mmHg, trivial effusion post to heart  . Hypercholesterolemia   . Hyperlipidemia   . Hypertension   . Osteopenia   . Pleural effusion associated with pulmonary infection    Admx 5/17 >> required L thoracentesis (cytology neg for malignancy)   . Polymorphic light eruption 2004  . Prolonged QT interval   . Pulmonary HTN ([place])   . Rheumatoid arthritis ([place]) 1975  . Right  middle ear infection 1986    Past Surgical History:  Procedure Laterality Date  . ABDOMINAL HYSTERECTOMY    . CARDIAC CATHETERIZATION    . CARDIOVERSION    . TEMPOROMANDIBULAR JOINT SURGERY       Current Outpatient Prescriptions  Medication Sig Dispense Refill  . acidophilus (RISAQUAD) CAPS capsule Take 1 capsule by mouth daily.    . cholecalciferol (VITAMIN D) 1000 UNITS tablet Take 2,000 Units by mouth daily.    Marland Kitchen etanercept (ENBREL) 25 MG injection Inject 25 mg into the skin once a week.    . fluticasone (FLONASE) 50 MCG/ACT nasal spray Place 2 sprays into both nostrils daily as needed for allergies or rhinitis.     . folic acid (FOLVITE) 1 MG tablet Take 2 mg by mouth daily.     . furosemide (LASIX) 40 MG tablet TAKE 1 TABLET BY MOUTH EVERY DAY OR AS DIRECTED 30 tablet 10  . methotrexate (RHEUMATREX) 2.5 MG tablet Take three (3) tablets (7.5 mg total) by mouth every Tuesday; Take two (2) tablets (5 mg total) by mouth every Wednesday.  1  . metoprolol succinate (TOPROL-XL) 50 MG 24 hr tablet TAKE 1 TABLET BY MOUTH TWICE DAILY, TAKE WITH OR IMMEDIATELY FOLLOWING A MEAL 60 tablet 5  . Multiple Vitamins-Minerals (MULTIVITAMIN WITH MINERALS) tablet Take 1 tablet  by mouth daily.    Marland Kitchen omeprazole (PRILOSEC) 20 MG capsule Take 20 mg by mouth every morning.     . potassium chloride (K-DUR,KLOR-CON) 10 MEQ tablet Take 10 mEq by mouth daily.    . pravastatin (PRAVACHOL) 20 MG tablet TAKE 1 TABLET BY MOUTH EVERY EVENING 30 tablet 11  . predniSONE (DELTASONE) 5 MG tablet Take 2 tablet for 2 days then resume prior home dose of 2.5 mg daily 10 tablet 0  . traMADol (ULTRAM) 50 MG tablet Take 1 tablet (50 mg total) by mouth every 6 (six) hours as needed for severe pain. 30 tablet 0  . verapamil (CALAN) 40 MG tablet Take 1 tablet (40 mg total) by mouth every 12 (twelve) hours. 60 tablet 6  . warfarin (COUMADIN) 5 MG tablet Take 7.5 mg by mouth on 5-17 then take 5 mg daily 30 tablet 0   No current  facility-administered medications for this visit.     Allergies:   Arthrotec [diclofenac-misoprostol]; Erythromycin; Morphine sulfate; Diltiazem hcl; Gold-containing drug products; Macrodantin [nitrofurantoin macrocrystal]; Naproxen; and Penicillins    Social History:  The patient  reports that she has never smoked. She has never used smokeless tobacco. She reports that she does not drink alcohol or use drugs.   Family History:  The patient's family history includes Healthy in her sister; Heart disease in her father, mother, sister, and sister; Other in her mother.    ROS:  Please see the history of present illness.   Otherwise, review of systems are positive for palpitations.   All other systems are reviewed and negative.    PHYSICAL EXAM: VS:  BP 110/70   Pulse 70   Ht 5\' 3"  (1.6 m)   Wt 151 lb (68.5 kg)   BMI 26.75 kg/m  , BMI Body mass index is 26.75 kg/m. GEN: Well nourished, well developed, in no acute distress  HEENT: normal  Neck: no JVD, carotid bruits, or masses Cardiac: irregularly irregular; no murmurs, rubs, or gallops,no edema  Respiratory:  Scant basilar crackles bilaterally, normal work of breathing GI: soft, nontender, nondistended, + BS MS: no deformity or atrophy  Skin: warm and dry, no rash Neuro:  Strength and sensation are intact Psych: euthymic mood, full affect    Recent Labs: 04/24/2015: TSH 1.752 05/24/2015: ALT 20 05/29/2015: BUN 16; Creatinine, Ser 0.70; Magnesium 2.1; Potassium 3.8; Sodium 142 06/01/2015: Hemoglobin 12.3; Platelets 339   Lipid Panel    Component Value Date/Time   CHOL 134 05/24/2015 1344   TRIG 148.0 05/21/2013 0846   HDL 36.60 (L) 05/21/2013 0846   CHOLHDL 4 05/21/2013 0846   VLDL 29.6 05/21/2013 0846   LDLCALC 79 05/21/2013 0846     Other studies Reviewed: Additional studies/ records that were reviewed today with results demonstrating: Hospital records reviewed.   ASSESSMENT AND PLAN:  1. AFib, rate controlled.   COntinue Coumadin or stroke prevention.  Continue current dose of Metoprolol Succinate and Verapamil.She cannot tolerate Diltiazem due to significant swelling. CHADS2-VASc=3 (female, age > 51, HTN). Continue coumadin.  2. HTN: Blood pressure control. Continue current medications. 3. Dyspnea on exertion: I think is related to some deconditioning after recovering from her recent lung illness. 4. Husband in rehab after hip fracture.  He has advanced heart failure also.    Current medicines are reviewed at length with the patient today.  The patient concerns regarding her medicines were addressed.  The following changes have been made:  No change  Labs/ tests ordered today include:  No orders of the defined types were placed in this encounter.   Recommend 150 minutes/week of aerobic exercise Low fat, low carb, high fiber diet recommended  Disposition:   FU in 9 months   Signed, Larae Grooms, MD  10/12/2015 11:32 AM    [facility] [address], [place], [place]  [address] Phone: [phone]; Fax: [phone]

## 2015-10-21 ENCOUNTER — Ambulatory Visit (INDEPENDENT_AMBULATORY_CARE_PROVIDER_SITE_OTHER): Payer: Medicare Other | Admitting: Pharmacist

## 2015-10-21 DIAGNOSIS — Z5181 Encounter for therapeutic drug level monitoring: Secondary | ICD-10-CM | POA: Diagnosis not present

## 2015-10-21 DIAGNOSIS — I4891 Unspecified atrial fibrillation: Secondary | ICD-10-CM

## 2015-10-21 DIAGNOSIS — Z7901 Long term (current) use of anticoagulants: Secondary | ICD-10-CM | POA: Diagnosis not present

## 2015-10-21 LAB — POCT INR: INR: 2.5

## 2015-10-30 ENCOUNTER — Other Ambulatory Visit: Payer: Self-pay | Admitting: Interventional Cardiology

## 2015-11-22 ENCOUNTER — Telehealth: Payer: Self-pay | Admitting: Interventional Cardiology

## 2015-11-22 NOTE — Telephone Encounter (Signed)
New message       Pt was seen in at the urgent care yesterday.  She has pneumonia.  They started her on levofloxacin 500mg  for 7 days and she was instructed to stop her coumadin.  She want to know if she should have a pt/inr check?  Please call

## 2015-11-22 NOTE — Telephone Encounter (Signed)
Returned call to pt, pt states she was started on Levaquin 500mg  QD x 7 days on 11/21/15.  MD at Urgent Care advised pt not to take Warfarin until she contacted Coumadin Clinic.  Pt states INR was not checked yesterday at urgent care visit.  Pt skipped last night's dosage of Warfarin, started taking Levaquin yesterday.  Advised pt INR was 2.5 at last INR check on 10/21/15, told pt to resume Coumadin today and continue on Levaquin will check INR on 11/26/15 for drug interaction. Made appt for 11/26/15 at 3:30pm, pt verbalized understanding.

## 2015-11-23 ENCOUNTER — Ambulatory Visit (INDEPENDENT_AMBULATORY_CARE_PROVIDER_SITE_OTHER): Payer: Medicare Other | Admitting: Adult Health

## 2015-11-23 ENCOUNTER — Encounter: Payer: Self-pay | Admitting: Adult Health

## 2015-11-23 DIAGNOSIS — J189 Pneumonia, unspecified organism: Secondary | ICD-10-CM

## 2015-11-23 DIAGNOSIS — J181 Lobar pneumonia, unspecified organism: Secondary | ICD-10-CM | POA: Diagnosis not present

## 2015-11-23 NOTE — Progress Notes (Signed)
Subjective:    Patient ID: [name], female    DOB: 08/17/1940, 75 y.o.   MRN: [id number]  HPI 75 year old female never smoker seen for pulmonary consult during hospitalization 05/24/2015 for pleural effusion She has RA on Enbrel and MTX.    11/23/2015 Urgent Care follow up  Pt says she developed cough, low grade fever , upset stomach, congestion , and sob for 1 week. Went to local urgent care , CXR on 11/21/15 showed RLL PNA . Started on Levaquin . She is feeling better.  Still feels weak. On Levaquin 3/7 . Cough is much better. Has some nasal drainage.  She denies chest pain, orthopnea, edema hemoptysis.   Husband has been sick with hip fracture , was at rehab . He is now home with PT.  She is stressed with his care. Feels she is run down.   Patient does have a history of rheumatoid arthritis. She is on MTX. And Enbrel . Last dose of Enbrel was 2 weeks ago . Enbrel is hold until she is better.     Past Medical History:  Diagnosis Date  . Acne rosacea   . Adhesive capsulitis of left shoulder 1980  . Allergic rhinitis   . Chronic atrial fibrillation ([place])    a. Coumadin anticoagulation - [facility] ([place])  . Esophageal reflux   . History of cardiac catheterization    a. Myoview 2/09: no ischemia; low risk  //  b. [place] 3/12: normal coronary arteries  . History of echocardiogram    a. Echo 4/17:  EF 55-60%, no RWMA, mild MR, mild LAE, PASP 48 mmHg, trivial effusion post to heart  . Hypercholesterolemia   . Hyperlipidemia   . Hypertension   . Osteopenia   . Pleural effusion associated with pulmonary infection    Admx 5/17 >> required L thoracentesis (cytology neg for malignancy)   . Polymorphic light eruption 2004  . Prolonged QT interval   . Pulmonary HTN   . Rheumatoid arthritis ([place]) 1975  . Right middle ear infection 1986   Current Outpatient Prescriptions on File Prior to Visit  Medication Sig Dispense Refill  . acidophilus (RISAQUAD) CAPS capsule  Take 1 capsule by mouth daily.    . cholecalciferol (VITAMIN D) 1000 UNITS tablet Take 2,000 Units by mouth daily.    Marland Kitchen etanercept (ENBREL) 25 MG injection Inject 25 mg into the skin once a week.    . folic acid (FOLVITE) 1 MG tablet Take 2 mg by mouth daily.     . furosemide (LASIX) 40 MG tablet TAKE 1 TABLET BY MOUTH EVERY DAY OR AS DIRECTED 30 tablet 10  . methotrexate (RHEUMATREX) 2.5 MG tablet Take three (3) tablets (7.5 mg total) by mouth every Tuesday; Take two (2) tablets (5 mg total) by mouth every Wednesday.  1  . metoprolol succinate (TOPROL-XL) 50 MG 24 hr tablet TAKE 1 TABLET BY MOUTH TWICE DAILY, TAKE WITH OR IMMEDIATELY FOLLOWING A MEAL 60 tablet 5  . Multiple Vitamins-Minerals (MULTIVITAMIN WITH MINERALS) tablet Take 1 tablet by mouth daily.    Marland Kitchen omeprazole (PRILOSEC) 20 MG capsule Take 20 mg by mouth every morning.     . potassium chloride (K-DUR,KLOR-CON) 10 MEQ tablet Take 10 mEq by mouth daily.    . pravastatin (PRAVACHOL) 20 MG tablet TAKE 1 TABLET BY MOUTH EVERY EVENING 30 tablet 11  . predniSONE (DELTASONE) 5 MG tablet Take 2 tablet for 2 days then resume prior home dose of 2.5 mg daily  10 tablet 0  . traMADol (ULTRAM) 50 MG tablet Take 1 tablet (50 mg total) by mouth every 6 (six) hours as needed for severe pain. 30 tablet 0  . verapamil (CALAN) 40 MG tablet Take 1 tablet (40 mg total) by mouth every 12 (twelve) hours. 60 tablet 6  . warfarin (COUMADIN) 5 MG tablet TAKE 1 TABLET BY MOUTH DAILY OR AS DIRECTED 90 tablet 1  . fluticasone (FLONASE) 50 MCG/ACT nasal spray Place 2 sprays into both nostrils daily as needed for allergies or rhinitis.      No current facility-administered medications on file prior to visit.        Review of Systems Constitutional:   No  weight loss, night sweats,  Fevers, chills,  +fatigue, or  lassitude.  HEENT:   No headaches,  Difficulty swallowing,  Tooth/dental problems, or  Sore throat,                No sneezing, itching, ear ache,  nasal congestion, post nasal drip,   CV:  No chest pain,  Orthopnea, PND, swelling in lower extremities, anasarca, dizziness, palpitations, syncope.   GI  No heartburn, indigestion, abdominal pain, nausea, vomiting, diarrhea, change in bowel habits, loss of appetite, bloody stools.   Resp:   No chest wall deformity  Skin: no rash or lesions.  GU: no dysuria, change in color of urine, no urgency or frequency.  No flank pain, no hematuria   MS:  No joint pain or swelling.  No decreased range of motion.  No back pain.  Psych:  No change in mood or affect. No depression or anxiety.  No memory loss.         Objective:   Physical Exam Vitals:   11/23/15 1119  BP: 122/60  Pulse: 78  Temp: 97.9 F (36.6 C)  TempSrc: Oral  SpO2: 98%  Weight: 145 lb 9.6 oz (66 kg)  Height: 5\' 3"  (1.6 m)    GEN: A/Ox3; pleasant , NAD, elderly    HEENT:  [place]/AT,  EACs-clear, TMs-wnl, NOSE-clear, THROAT-clear, no lesions, no postnasal drip or exudate noted.   NECK:  Supple w/ fair ROM; no JVD; normal carotid impulses w/o bruits; no thyromegaly or nodules palpated; no lymphadenopathy.    RESP  Clear  P & A; w/o, wheezes/ rales/ or rhonchi. no accessory muscle use, no dullness to percussion  CARD:  RRR, no m/r/g  , no peripheral edema, pulses intact, no cyanosis or clubbing.  GI:   Soft & nt; nml bowel sounds; no organomegaly or masses detected.   Musco: Warm bil, no deformities or joint swelling noted.   Neuro: alert, no focal deficits noted.    Skin: Warm, no lesions or rashes   Tammy Parrett NP-C  [facility]  11/23/2015       Assessment & Plan:

## 2015-11-23 NOTE — Assessment & Plan Note (Signed)
RLL PNA -responding to Levaquin  Check serial cxr until resolved.   Plan  Patient Instructions  Finish Levaquin .  Mucinex DM Twice daily  As needed  Cough/congestion  Follow up with coumadin clinic this week as planned  follow up in 2-3 weeks with chest xray Dr. Lake Bells or Shubh Chiara  Please contact office for sooner follow up if symptoms do not improve or worsen or seek emergency care

## 2015-11-23 NOTE — Patient Instructions (Signed)
Finish Levaquin .  Mucinex DM Twice daily  As needed  Cough/congestion  Follow up with coumadin clinic this week as planned  follow up in 2-3 weeks with chest xray Dr. Lake Bells or Parrett  Please contact office for sooner follow up if symptoms do not improve or worsen or seek emergency care

## 2015-11-26 ENCOUNTER — Ambulatory Visit (INDEPENDENT_AMBULATORY_CARE_PROVIDER_SITE_OTHER): Payer: Medicare Other | Admitting: *Deleted

## 2015-11-26 DIAGNOSIS — Z5181 Encounter for therapeutic drug level monitoring: Secondary | ICD-10-CM

## 2015-11-26 DIAGNOSIS — Z7901 Long term (current) use of anticoagulants: Secondary | ICD-10-CM | POA: Diagnosis not present

## 2015-11-26 DIAGNOSIS — I4891 Unspecified atrial fibrillation: Secondary | ICD-10-CM | POA: Diagnosis not present

## 2015-11-26 LAB — POCT INR: INR: 2.2

## 2015-11-29 NOTE — Progress Notes (Signed)
Reviewed, agree 

## 2015-11-30 ENCOUNTER — Other Ambulatory Visit: Payer: Self-pay | Admitting: Interventional Cardiology

## 2015-12-07 ENCOUNTER — Ambulatory Visit: Payer: Medicare Other | Admitting: Adult Health

## 2015-12-13 ENCOUNTER — Other Ambulatory Visit (INDEPENDENT_AMBULATORY_CARE_PROVIDER_SITE_OTHER): Payer: Medicare Other

## 2015-12-13 ENCOUNTER — Encounter: Payer: Self-pay | Admitting: Adult Health

## 2015-12-13 ENCOUNTER — Ambulatory Visit (INDEPENDENT_AMBULATORY_CARE_PROVIDER_SITE_OTHER): Payer: Medicare Other | Admitting: Adult Health

## 2015-12-13 ENCOUNTER — Ambulatory Visit (INDEPENDENT_AMBULATORY_CARE_PROVIDER_SITE_OTHER)
Admission: RE | Admit: 2015-12-13 | Discharge: 2015-12-13 | Disposition: A | Discharge status: Home / Self Care | Payer: Medicare Other | Source: Ambulatory Visit | Attending: Adult Health | Admitting: Adult Health

## 2015-12-13 VITALS — BP 110/60 | HR 100 | Temp 98.2°F | Ht 63.0 in | Wt 146.6 lb

## 2015-12-13 DIAGNOSIS — J189 Pneumonia, unspecified organism: Secondary | ICD-10-CM

## 2015-12-13 DIAGNOSIS — J90 Pleural effusion, not elsewhere classified: Secondary | ICD-10-CM

## 2015-12-13 DIAGNOSIS — J9601 Acute respiratory failure with hypoxia: Secondary | ICD-10-CM | POA: Diagnosis not present

## 2015-12-13 DIAGNOSIS — R0600 Dyspnea, unspecified: Secondary | ICD-10-CM

## 2015-12-13 LAB — BASIC METABOLIC PANEL
BUN: 9 mg/dL (ref 6–23)
CHLORIDE: 103 meq/L (ref 96–112)
CO2: 30 mEq/L (ref 19–32)
Calcium: 9.1 mg/dL (ref 8.4–10.5)
Creatinine, Ser: 0.77 mg/dL (ref 0.40–1.20)
GFR: 77.8 mL/min (ref >60.00)
GLUCOSE: 99 mg/dL (ref 70–99)
POTASSIUM: 3.6 meq/L (ref 3.5–5.1)
SODIUM: 140 meq/L (ref 135–145)

## 2015-12-13 LAB — BRAIN NATRIURETIC PEPTIDE: PRO B NATRI PEPTIDE: 215 pg/mL — AB (ref 0.0–100.0)

## 2015-12-13 LAB — SEDIMENTATION RATE: Sed Rate: 26 mm/hr (ref 0–30)

## 2015-12-13 NOTE — Progress Notes (Signed)
Subjective    Patient ID: [name], female    DOB: 01-27-1940, 75 y.o.   MRN: [id number]  HPI 75 year old female never smoker seen for pulmonary consult during hospitalization 05/24/2015 for pleural effusion She has RA on Enbrel and MTX.    12/13/2015 Follow up : PNA  Pt returns for 3 week follow up .  She was treated at urgent care for RLL PNA on 11/21/15 with Levaquin . She felt better for short time but is starting to feel bad today  CXR today shows no sign  of PNA but increased Cardiomegaly.  Says she is under a lot of stress. She is taking care of her husband who has full care in wheelchair . She is having to lift and pull on him. She says she is very tired and sore pulling on him.  She says she has some pleuritic pain , and back pain . Gets sob when she bends over. No orthopnea, .  Echo in 09/2015 EF 60-65%, PAP 34, trivial pericardial effusion.  Patient does have a history of rheumatoid arthritis. She is on MTX. And Enbrel . Last dose of Enbrel was 2- weeks ago . Enbrel is hold until she is better.   She denies fever, chest pain, orthopnea, or edema.    Past Medical History:  Diagnosis Date  . Acne rosacea   . Adhesive capsulitis of left shoulder 1980  . Allergic rhinitis   . Chronic atrial fibrillation ([place])    a. Coumadin anticoagulation - [facility] ([place])  . Esophageal reflux   . History of cardiac catheterization    a. Myoview 2/09: no ischemia; low risk  //  b. [place] 3/12: normal coronary arteries  . History of echocardiogram    a. Echo 4/17:  EF 55-60%, no RWMA, mild MR, mild LAE, PASP 48 mmHg, trivial effusion post to heart  . Hypercholesterolemia   . Hyperlipidemia   . Hypertension   . Osteopenia   . Pleural effusion associated with pulmonary infection    Admx 5/17 >> required L thoracentesis (cytology neg for malignancy)   . Polymorphic light eruption 2004  . Prolonged QT interval   . Pulmonary HTN   . Rheumatoid arthritis ([place]) 1975  .  Right middle ear infection 1986   Current Outpatient Prescriptions on File Prior to Visit  Medication Sig Dispense Refill  . acidophilus (RISAQUAD) CAPS capsule Take 1 capsule by mouth daily.    . cholecalciferol (VITAMIN D) 1000 UNITS tablet Take 2,000 Units by mouth daily.    Marland Kitchen etanercept (ENBREL) 25 MG injection Inject 25 mg into the skin once a week.    . fluticasone (FLONASE) 50 MCG/ACT nasal spray Place 2 sprays into both nostrils daily as needed for allergies or rhinitis.     . folic acid (FOLVITE) 1 MG tablet Take 2 mg by mouth daily.     . furosemide (LASIX) 40 MG tablet TAKE 1 TABLET BY MOUTH EVERY DAY OR AS DIRECTED 30 tablet 10  . methotrexate (RHEUMATREX) 2.5 MG tablet Take three (3) tablets (7.5 mg total) by mouth every Tuesday; Take two (2) tablets (5 mg total) by mouth every Wednesday.  1  . metoprolol succinate (TOPROL-XL) 50 MG 24 hr tablet TAKE 1 TABLET BY MOUTH TWICE DAILY( WITH OR IMMEDIATELY FOLLOWING A MEAL) 180 tablet 1  . Multiple Vitamins-Minerals (MULTIVITAMIN WITH MINERALS) tablet Take 1 tablet by mouth daily.    Marland Kitchen omeprazole (PRILOSEC) 20 MG capsule Take 20 mg by mouth  every morning.     . potassium chloride (K-DUR,KLOR-CON) 10 MEQ tablet Take 10 mEq by mouth daily.    . pravastatin (PRAVACHOL) 20 MG tablet TAKE 1 TABLET BY MOUTH EVERY EVENING 30 tablet 11  . predniSONE (DELTASONE) 5 MG tablet Take 2 tablet for 2 days then resume prior home dose of 2.5 mg daily 10 tablet 0  . traMADol (ULTRAM) 50 MG tablet Take 1 tablet (50 mg total) by mouth every 6 (six) hours as needed for severe pain. 30 tablet 0  . verapamil (CALAN) 40 MG tablet Take 1 tablet (40 mg total) by mouth every 12 (twelve) hours. 60 tablet 6  . warfarin (COUMADIN) 5 MG tablet TAKE 1 TABLET BY MOUTH DAILY OR AS DIRECTED 90 tablet 1   No current facility-administered medications on file prior to visit.        Review of Systems Constitutional:   No  weight loss, night sweats,  Fevers, chills,    +fatigue, or  lassitude.  HEENT:   No headaches,  Difficulty swallowing,  Tooth/dental problems, or  Sore throat,                No sneezing, itching, ear ache, nasal congestion, post nasal drip,   CV:  No chest pain,  Orthopnea, PND, swelling in lower extremities, anasarca, dizziness, palpitations, syncope.   GI  No heartburn, indigestion, abdominal pain, nausea, vomiting, diarrhea, change in bowel habits, loss of appetite, bloody stools.   Resp:   No chest wall deformity  Skin: no rash or lesions.  GU: no dysuria, change in color of urine, no urgency or frequency.  No flank pain, no hematuria   MS:  No joint pain or swelling.  No decreased range of motion.  No back pain.  Psych:  No change in mood or affect. No depression or anxiety.  No memory loss.         Objective:   Physical Exam Vitals:   12/13/15 1610  BP: 110/60  Pulse: 100  Temp: 98.2 F (36.8 C)  TempSrc: Oral  SpO2: 98%  Weight: 146 lb 9.6 oz (66.5 kg)  Height: 5\' 3"  (1.6 m)    GEN: A/Ox3; pleasant , NAD, elderly    HEENT:  [place]/AT,  EACs-clear, TMs-wnl, NOSE-clear, THROAT-clear, no lesions, no postnasal drip or exudate noted.   NECK:  Supple w/ fair ROM; no JVD; normal carotid impulses w/o bruits; no thyromegaly or nodules palpated; no lymphadenopathy.    RESP  Clear  P & A; w/o, wheezes/ rales/ or rhonchi. no accessory muscle use, no dullness to percussion  CARD:  RRR, no m/r/g  , no peripheral edema, pulses intact, no cyanosis or clubbing.  GI:   Soft & nt; nml bowel sounds; no organomegaly or masses detected.   Musco: Warm bil, no deformities or joint swelling noted.   Neuro: alert, no focal deficits noted.    Skin: Warm, no lesions or rashes  CXR 12/13/15  No evidence of pneumonia.  Marked enlargement of the cardiac silhouette which is more conspicuous than on the previous study. This may reflect the presence of a pericardial effusion. No pulmonary edema.  Thoracic aortic  atherosclerosis.  Jennyfer Nickolson NP-C  [facility]  12/13/2015       Assessment & Plan:

## 2015-12-13 NOTE — Patient Instructions (Signed)
Continue on current regimen  Labs today .  follow up Dr. Lake Bells in 6 week and As needed   Please contact office for sooner follow up if symptoms do not improve or worsen or seek emergency care

## 2015-12-14 ENCOUNTER — Telehealth: Payer: Self-pay | Admitting: Adult Health

## 2015-12-14 NOTE — Telephone Encounter (Signed)
Melvenia Needles, NP  Katherine Roan Cox, CMA        Labs shows Heart Failure marker is mildly elevated, similar to past . Inflammatory marker is ok  CXR shows possible fluid around heart. Vs loculated effusion  We need to do CT chest w/ contrast to evaluate further . -hopefully they can do today or tomorrow.  Please contact office for sooner follow up if symptoms do not improve or worsen or seek emergency care    Pt aware of results & voiced understanding. CT has been ordered.  Nothing further needed.

## 2015-12-14 NOTE — Assessment & Plan Note (Signed)
Recent PNA now w/ slow clinical improvement after abx.  CXR today shows no acute PNA . Does show increased cardiomegaly , ? Could this be loculated effusion , pericardial effusion . She had  A repeat echo in Sept that showed preserved EF , PAP at 51mHg.  Will set up pt for CT chest w/ contrast to evaluate further  Labs with BNP , ESR , bmet pending  Case discussed with Dr. MLake Bells.   Plan  Patient Instructions  Continue on current regimen  Labs today .  follow up Dr. MLake Bellsin 6 week and As needed   Please contact office for sooner follow up if symptoms do not improve or worsen or seek emergency care

## 2015-12-15 ENCOUNTER — Encounter (HOSPITAL_COMMUNITY): Payer: Self-pay

## 2015-12-15 ENCOUNTER — Inpatient Hospital Stay (HOSPITAL_COMMUNITY)
Admission: EM | Admit: 2015-12-15 | Discharge: 2015-12-19 | DRG: 315 | Disposition: A | Discharge status: Home / Self Care | Payer: Medicare Other | Attending: Internal Medicine | Admitting: Internal Medicine

## 2015-12-15 ENCOUNTER — Telehealth: Payer: Self-pay | Admitting: Adult Health

## 2015-12-15 ENCOUNTER — Ambulatory Visit (INDEPENDENT_AMBULATORY_CARE_PROVIDER_SITE_OTHER)
Admission: RE | Admit: 2015-12-15 | Discharge: 2015-12-15 | Disposition: A | Discharge status: Home / Self Care | Payer: Medicare Other | Source: Ambulatory Visit | Attending: Adult Health | Admitting: Adult Health

## 2015-12-15 DIAGNOSIS — Z880 Allergy status to penicillin: Secondary | ICD-10-CM

## 2015-12-15 DIAGNOSIS — I313 Pericardial effusion (noninflammatory): Secondary | ICD-10-CM | POA: Diagnosis present

## 2015-12-15 DIAGNOSIS — E785 Hyperlipidemia, unspecified: Secondary | ICD-10-CM | POA: Diagnosis present

## 2015-12-15 DIAGNOSIS — Z7951 Long term (current) use of inhaled steroids: Secondary | ICD-10-CM

## 2015-12-15 DIAGNOSIS — Z885 Allergy status to narcotic agent status: Secondary | ICD-10-CM

## 2015-12-15 DIAGNOSIS — R0600 Dyspnea, unspecified: Secondary | ICD-10-CM | POA: Diagnosis not present

## 2015-12-15 DIAGNOSIS — Z888 Allergy status to other drugs, medicaments and biological substances status: Secondary | ICD-10-CM

## 2015-12-15 DIAGNOSIS — I509 Heart failure, unspecified: Secondary | ICD-10-CM | POA: Diagnosis present

## 2015-12-15 DIAGNOSIS — M8580 Other specified disorders of bone density and structure, unspecified site: Secondary | ICD-10-CM | POA: Diagnosis present

## 2015-12-15 DIAGNOSIS — I110 Hypertensive heart disease with heart failure: Secondary | ICD-10-CM | POA: Diagnosis present

## 2015-12-15 DIAGNOSIS — Z9889 Other specified postprocedural states: Secondary | ICD-10-CM | POA: Diagnosis not present

## 2015-12-15 DIAGNOSIS — I480 Paroxysmal atrial fibrillation: Secondary | ICD-10-CM | POA: Diagnosis present

## 2015-12-15 DIAGNOSIS — I312 Hemopericardium, not elsewhere classified: Secondary | ICD-10-CM | POA: Diagnosis present

## 2015-12-15 DIAGNOSIS — K219 Gastro-esophageal reflux disease without esophagitis: Secondary | ICD-10-CM | POA: Diagnosis present

## 2015-12-15 DIAGNOSIS — I3139 Other pericardial effusion (noninflammatory): Secondary | ICD-10-CM

## 2015-12-15 DIAGNOSIS — Z8249 Family history of ischemic heart disease and other diseases of the circulatory system: Secondary | ICD-10-CM | POA: Diagnosis not present

## 2015-12-15 DIAGNOSIS — I481 Persistent atrial fibrillation: Secondary | ICD-10-CM | POA: Diagnosis present

## 2015-12-15 DIAGNOSIS — M0579 Rheumatoid arthritis with rheumatoid factor of multiple sites without organ or systems involvement: Secondary | ICD-10-CM

## 2015-12-15 DIAGNOSIS — Z90710 Acquired absence of both cervix and uterus: Secondary | ICD-10-CM | POA: Diagnosis not present

## 2015-12-15 DIAGNOSIS — M069 Rheumatoid arthritis, unspecified: Secondary | ICD-10-CM | POA: Diagnosis present

## 2015-12-15 DIAGNOSIS — I10 Essential (primary) hypertension: Secondary | ICD-10-CM | POA: Diagnosis present

## 2015-12-15 DIAGNOSIS — Z7901 Long term (current) use of anticoagulants: Secondary | ICD-10-CM

## 2015-12-15 DIAGNOSIS — Z8701 Personal history of pneumonia (recurrent): Secondary | ICD-10-CM | POA: Diagnosis not present

## 2015-12-15 DIAGNOSIS — J90 Pleural effusion, not elsewhere classified: Secondary | ICD-10-CM | POA: Diagnosis not present

## 2015-12-15 DIAGNOSIS — Z79899 Other long term (current) drug therapy: Secondary | ICD-10-CM | POA: Diagnosis not present

## 2015-12-15 DIAGNOSIS — I319 Disease of pericardium, unspecified: Secondary | ICD-10-CM | POA: Diagnosis not present

## 2015-12-15 DIAGNOSIS — I482 Chronic atrial fibrillation: Secondary | ICD-10-CM | POA: Diagnosis present

## 2015-12-15 DIAGNOSIS — I4891 Unspecified atrial fibrillation: Secondary | ICD-10-CM | POA: Diagnosis not present

## 2015-12-15 HISTORY — DX: Pneumonia, unspecified organism: J18.9

## 2015-12-15 HISTORY — DX: Epistaxis: R04.0

## 2015-12-15 HISTORY — DX: Respiratory failure, unspecified, unspecified whether with hypoxia or hypercapnia: J96.90

## 2015-12-15 HISTORY — DX: Unspecified malignant neoplasm of skin, unspecified: C44.90

## 2015-12-15 HISTORY — DX: Unspecified chronic bronchitis: J42

## 2015-12-15 HISTORY — DX: Cardiac murmur, unspecified: R01.1

## 2015-12-15 LAB — BASIC METABOLIC PANEL
Anion gap: 6 (ref 5–15)
BUN: 5 mg/dL — AB (ref 6–20)
CHLORIDE: 107 mmol/L (ref 101–111)
CO2: 24 mmol/L (ref 22–32)
Calcium: 8.6 mg/dL — ABNORMAL LOW (ref 8.9–10.3)
Creatinine, Ser: 0.72 mg/dL (ref 0.44–1.00)
GFR calc Af Amer: >60 mL/min (ref >60)
GFR calc non Af Amer: >60 mL/min (ref >60)
GLUCOSE: 93 mg/dL (ref 65–99)
POTASSIUM: 3.7 mmol/L (ref 3.5–5.1)
Sodium: 137 mmol/L (ref 135–145)

## 2015-12-15 LAB — HEPATIC FUNCTION PANEL
ALT: 17 U/L (ref 14–54)
AST: 26 U/L (ref 15–41)
Albumin: 3.1 g/dL — ABNORMAL LOW (ref 3.5–5.0)
Alkaline Phosphatase: 62 U/L (ref 38–126)
Bilirubin, Direct: 0.2 mg/dL (ref 0.1–0.5)
Indirect Bilirubin: 0.2 mg/dL — ABNORMAL LOW (ref 0.3–0.9)
Total Bilirubin: 0.4 mg/dL (ref 0.3–1.2)
Total Protein: 5.8 g/dL — ABNORMAL LOW (ref 6.5–8.1)

## 2015-12-15 LAB — CBC
HEMATOCRIT: 32.4 % — AB (ref 36.0–46.0)
HEMOGLOBIN: 10.6 g/dL — AB (ref 12.0–15.0)
MCH: 30.6 pg (ref 26.0–34.0)
MCHC: 32.7 g/dL (ref 30.0–36.0)
MCV: 93.6 fL (ref 78.0–100.0)
PLATELETS: 301 10*3/uL (ref 150–400)
RBC: 3.46 MIL/uL — AB (ref 3.87–5.11)
RDW: 15.3 % (ref 11.5–15.5)
WBC: 5.8 10*3/uL (ref 4.0–10.5)

## 2015-12-15 LAB — PROTIME-INR
INR: 1.76
Prothrombin Time: 20.8 s — ABNORMAL HIGH (ref 11.4–15.2)

## 2015-12-15 LAB — C-REACTIVE PROTEIN: CRP: 2.2 mg/dL — ABNORMAL HIGH

## 2015-12-15 LAB — BRAIN NATRIURETIC PEPTIDE: B Natriuretic Peptide: 224.8 pg/mL — ABNORMAL HIGH (ref 0.0–100.0)

## 2015-12-15 MED ORDER — TRAMADOL 5 MG/ML ORAL SUSPENSION: 50.0000 mg | Freq: Four times a day (QID) | ORAL | Status: DC | PRN

## 2015-12-15 MED ORDER — METOPROLOL TARTRATE 5 MG/5ML IV SOLN: 5.0000 mg | INTRAVENOUS | Status: DC | PRN

## 2015-12-15 MED ORDER — TRAMADOL HCL 50 MG PO TABS: 50.0000 mg | ORAL_TABLET | Freq: Four times a day (QID) | ORAL | Status: DC | PRN

## 2015-12-15 MED ORDER — PRAVASTATIN SODIUM 40 MG PO TABS
20.0000 mg | ORAL_TABLET | Freq: Every evening | ORAL | Status: DC
  Administered 2015-12-16 – 2015-12-18 (×3): 20 mg via ORAL
  Filled 2015-12-15 (×3): qty 1

## 2015-12-15 MED ORDER — ALPRAZOLAM 0.5 MG PO TABS: 0.2500 mg | ORAL_TABLET | Freq: Every evening | ORAL | Status: DC | PRN

## 2015-12-15 MED ORDER — HYDROCORTISONE NA SUCCINATE PF 100 MG IJ SOLR: 50.0000 mg | Freq: Once | INTRAMUSCULAR | Status: DC

## 2015-12-15 MED ORDER — COLCHICINE 0.6 MG PO TABS
0.6000 mg | ORAL_TABLET | Freq: Two times a day (BID) | ORAL | Status: DC
  Administered 2015-12-16 – 2015-12-19 (×5): 0.6 mg via ORAL
  Filled 2015-12-15 (×7): qty 1

## 2015-12-15 MED ORDER — METOPROLOL SUCCINATE ER 50 MG PO TB24
50.0000 mg | ORAL_TABLET | Freq: Two times a day (BID) | ORAL | Status: DC
  Administered 2015-12-16: 50 mg via ORAL
  Filled 2015-12-15: qty 1

## 2015-12-15 MED ORDER — VERAPAMIL HCL 40 MG PO TABS
40.0000 mg | ORAL_TABLET | Freq: Two times a day (BID) | ORAL | Status: DC
  Administered 2015-12-16 – 2015-12-19 (×7): 40 mg via ORAL
  Filled 2015-12-15 (×9): qty 1

## 2015-12-15 MED ORDER — ALBUTEROL SULFATE (2.5 MG/3ML) 0.083% IN NEBU: 2.5000 mg | INHALATION_SOLUTION | RESPIRATORY_TRACT | Status: DC | PRN

## 2015-12-15 MED ORDER — ACETAMINOPHEN 650 MG RE SUPP: 650.0000 mg | Freq: Four times a day (QID) | RECTAL | Status: DC | PRN

## 2015-12-15 MED ORDER — SODIUM CHLORIDE 0.9% FLUSH
3.0000 mL | Freq: Two times a day (BID) | INTRAVENOUS | Status: DC
  Administered 2015-12-16 – 2015-12-19 (×6): 3 mL via INTRAVENOUS

## 2015-12-15 MED ORDER — ADULT MULTIVITAMIN W/MINERALS CH
1.0000 | ORAL_TABLET | Freq: Every day | ORAL | Status: DC
  Administered 2015-12-16 – 2015-12-19 (×4): 1 via ORAL
  Filled 2015-12-15 (×4): qty 1

## 2015-12-15 MED ORDER — RISAQUAD PO CAPS
1.0000 | ORAL_CAPSULE | Freq: Every day | ORAL | Status: DC
  Administered 2015-12-16 – 2015-12-18 (×3): 1 via ORAL
  Filled 2015-12-15 (×3): qty 1

## 2015-12-15 MED ORDER — PREDNISONE 2.5 MG PO TABS
2.5000 mg | ORAL_TABLET | Freq: Every day | ORAL | Status: DC
  Administered 2015-12-16 – 2015-12-19 (×4): 2.5 mg via ORAL
  Filled 2015-12-15 (×4): qty 1

## 2015-12-15 MED ORDER — FUROSEMIDE 40 MG PO TABS: 40.0000 mg | ORAL_TABLET | Freq: Every day | ORAL | Status: DC

## 2015-12-15 MED ORDER — GUAIFENESIN ER 600 MG PO TB12: 600.0000 mg | ORAL_TABLET | Freq: Two times a day (BID) | ORAL | Status: DC | PRN

## 2015-12-15 MED ORDER — VITAMIN D 1000 UNITS PO TABS
2000.0000 [IU] | ORAL_TABLET | Freq: Every day | ORAL | Status: DC
  Administered 2015-12-16 – 2015-12-19 (×4): 2000 [IU] via ORAL
  Filled 2015-12-15 (×4): qty 2

## 2015-12-15 MED ORDER — FOLIC ACID 1 MG PO TABS
2.0000 mg | ORAL_TABLET | Freq: Every day | ORAL | Status: DC
  Administered 2015-12-16 – 2015-12-19 (×4): 2 mg via ORAL
  Filled 2015-12-15 (×4): qty 2

## 2015-12-15 MED ORDER — ZOLPIDEM TARTRATE 5 MG PO TABS: 5.0000 mg | ORAL_TABLET | Freq: Every evening | ORAL | Status: DC | PRN

## 2015-12-15 MED ORDER — ACETAMINOPHEN 325 MG PO TABS: 650.0000 mg | ORAL_TABLET | Freq: Four times a day (QID) | ORAL | Status: DC | PRN

## 2015-12-15 MED ORDER — IOPAMIDOL (ISOVUE-300) INJECTION 61%
80.0000 mL | Freq: Once | INTRAVENOUS | Status: AC | PRN
  Administered 2015-12-15: 80 mL via INTRAVENOUS

## 2015-12-15 MED ORDER — ASPIRIN 325 MG PO TABS
325.0000 mg | ORAL_TABLET | Freq: Every day | ORAL | Status: DC
  Administered 2015-12-16: 03:00:00 325 mg via ORAL
  Filled 2015-12-15: qty 1

## 2015-12-15 MED ORDER — PANTOPRAZOLE SODIUM 40 MG PO TBEC
40.0000 mg | DELAYED_RELEASE_TABLET | Freq: Every day | ORAL | Status: DC
  Administered 2015-12-17 – 2015-12-19 (×3): 40 mg via ORAL
  Filled 2015-12-15 (×3): qty 1

## 2015-12-15 MED ORDER — FLUTICASONE PROPIONATE 50 MCG/ACT NA SUSP: 2.0000 | Freq: Every day | NASAL | Status: DC | PRN

## 2015-12-15 NOTE — Consult Note (Signed)
CARDIOLOGY CONSULT NOTE   Patient ID: [name] MRN: [id number], DOB/AGE: 08-27-1940   Admit date: 12/15/2015 Date of Consult: 12/15/2015   Primary Physician: Leeroy Cha, MD Primary Cardiologist: Irish Lack  Pt. Profile 75 yo female w/ pAF, RA (on embrel/mtx/pred), HTN and HLD who presents after finding a moderate circumferential pericardial effusion on a CT scan.   She reports having a viral infection (norovirus) > 3 weeks ago, then developing pneumonia for which she completed oral antibiotics. However she has been more SOB during this time. Since she had a TTE done on 09/2015, wasn't able to get repeat TTE (insurance) therefore had CT scan done.   Denies any RA flares. No sick contacts. No night sweats.   Problem List  Past Medical History:  Diagnosis Date  . Acne rosacea   . Adhesive capsulitis of left shoulder 1980  . Allergic rhinitis   . Chronic atrial fibrillation ([place])    a. Coumadin anticoagulation - [facility] ([place])  . Esophageal reflux   . History of cardiac catheterization    a. [facility] 2/09: no ischemia; low risk  //  b. [place] 3/12: normal coronary arteries  . History of echocardiogram    a. Echo 4/17:  EF 55-60%, no RWMA, mild MR, mild LAE, PASP 48 mmHg, trivial effusion post to heart  . Hypercholesterolemia   . Hyperlipidemia   . Hypertension   . Osteopenia   . Pleural effusion associated with pulmonary infection    Admx 5/17 >> required L thoracentesis (cytology neg for malignancy)   . Polymorphic light eruption 2004  . Prolonged QT interval   . Pulmonary HTN   . Rheumatoid arthritis ([place]) 1975  . Right middle ear infection 1986    Past Surgical History:  Procedure Laterality Date  . ABDOMINAL HYSTERECTOMY    . CARDIAC CATHETERIZATION    . CARDIOVERSION    . TEMPOROMANDIBULAR JOINT SURGERY       Allergies  Allergies  Allergen Reactions  . Arthrotec [Diclofenac-Misoprostol] Diarrhea  . Erythromycin Other (See Comments)     Anaphylaxis   . Morphine Sulfate Other (See Comments)    Erratic behavior; hallucinations  . Diltiazem Hcl Swelling and Rash    Able to tolerate Verapamil  . Gold-Containing Drug Products Rash  . Macrodantin [Nitrofurantoin Macrocrystal] Rash  . Naproxen Rash  . Penicillins Rash    Amoxicillin Has patient had a PCN reaction causing immediate rash, facial/tongue/throat swelling, SOB or lightheadedness with hypotension: Yes Has patient had a PCN reaction causing severe rash involving mucus membranes or skin necrosis: Yes Has patient had a PCN reaction that required hospitalization No Has patient had a PCN reaction occurring within the last 10 years: No If all of the above answers are "NO", then may proceed with Cephalosporin use.     inpatient Medications  . acidophilus  1 capsule Oral Daily  . aspirin  325 mg Oral Daily  . [START ON 12/16/2015] cholecalciferol  2,000 Units Oral Daily  . colchicine  0.6 mg Oral BID  . [START ON [id number] folic acid  2 mg Oral Daily  . furosemide  40 mg Oral Daily  . hydrocortisone sod succinate (SOLU-CORTEF) inj  50 mg Intravenous Once  . metoprolol succinate  50 mg Oral BID  . [START ON 12/16/2015] multivitamin with minerals  1 tablet Oral Daily  . [START ON 12/16/2015] pantoprazole  40 mg Oral Daily  . pravastatin  20 mg Oral QPM  . [START ON 12/16/2015] predniSONE  2.5  mg Oral Q breakfast  . sodium chloride flush  3 mL Intravenous Q12H  . verapamil  40 mg Oral Q12H    Family History Family History  Problem Relation Age of Onset  . Heart disease Father   . Heart disease Mother   . Other Mother     prolonged qtc  . Heart disease Sister   . Heart disease Sister   . Healthy Sister   . Fainting Neg Hx      Social History Social History   Social History  . Marital status: Married    Spouse name: N/A  . Number of children: N/A  . Years of education: N/A   Occupational History  . Not on file.   Social History Main Topics    . Smoking status: Never Smoker  . Smokeless tobacco: Never Used  . Alcohol use No  . Drug use: No  . Sexual activity: Not on file   Other Topics Concern  . Not on file   Social History Narrative  . No narrative on file     Review of Systems  General:  No chills, fever, night sweats or weight changes.  Cardiovascular:  No chest pain, dyspnea on exertion, edema, orthopnea, palpitations, paroxysmal nocturnal dyspnea. Dermatological: No rash, lesions/masses Respiratory: No cough, dyspnea Urologic: No hematuria, dysuria Abdominal:   No nausea, vomiting, diarrhea, bright red blood per rectum, melena, or hematemesis Neurologic:  No visual changes, wkns, changes in mental status. All other systems reviewed and are otherwise negative except as noted above.  Physical Exam  Blood pressure 129/88, pulse (!) 124, temperature 98.3 F (36.8 C), temperature source Oral, resp. rate 23, height 5\' 3"  (1.6 m), weight 65.3 kg (144 lb), SpO2 100 %.  General: Pleasant, NAD Psych: Normal affect. Neuro: Alert and oriented X 3. Moves all extremities spontaneously. HEENT: Normal  Neck: Supple without bruits or JVD. Lungs:  Resp regular and unlabored, CTA. Heart: tachy, irregular,  Abdomen: Soft, non-tender, non-distended, BS + x 4.  Extremities: No clubbing, cyanosis or edema. DP/PT/Radials 2+ and equal bilaterally.  Labs  No results for input(s): CKTOTAL, CKMB, TROPONINI in the last 72 hours. Lab Results  Component Value Date   WBC 5.8 12/15/2015   HGB 10.6 (L) 12/15/2015   HCT 32.4 (L) 12/15/2015   MCV 93.6 12/15/2015   PLT 301 12/15/2015    Recent Labs Lab 12/15/15 1955  NA 137  K 3.7  CL 107  CO2 24  BUN 5*  CREATININE 0.72  CALCIUM 8.6*  PROT 5.8*  BILITOT 0.4  ALKPHOS 62  ALT 17  AST 26  GLUCOSE 93   Lab Results  Component Value Date   CHOL 134 05/24/2015   HDL 36.60 (L) 05/21/2013   LDLCALC 79 05/21/2013   TRIG 148.0 05/21/2013   Lab Results  Component Value  Date   DDIMER (H) 04/04/2010    0.59        AT THE INHOUSE ESTABLISHED CUTOFF VALUE OF 0.48 ug/mL FEU, THIS ASSAY HAS BEEN DOCUMENTED IN THE LITERATURE TO HAVE A SENSITIVITY AND NEGATIVE PREDICTIVE VALUE OF AT LEAST 98 TO 99%.  THE TEST RESULT SHOULD BE CORRELATED WITH AN ASSESSMENT OF THE CLINICAL PROBABILITY OF DVT / VTE.    Radiology/Studies  Dg Chest 2 View  Result Date: 12/13/2015 CLINICAL DATA:  Follow-up of pneumonia. The patient has undergone antibiotic treatment. The patient reports persistent chest pain and shortness of breath. History of atrial fibrillation. EXAM: CHEST  2 VIEW COMPARISON:  PA and lateral chest x-ray dated September 15, 2015 FINDINGS: The cardiopericardial silhouette remains quite enlarged and has increased in conspicuity since the previous study. The lungs are well-expanded. There is no focal infiltrate. The pulmonary vascularity is not engorged. There is calcification in the wall of the aortic arch. There is mild multilevel degenerative disc disease of the thoracic spine. IMPRESSION: No evidence of pneumonia. Marked enlargement of the cardiac silhouette which is more conspicuous than on the previous study. This may reflect the presence of a pericardial effusion. No pulmonary edema. Thoracic aortic atherosclerosis. Electronically Signed   By: David  Martinique M.D.   On: 12/13/2015 16:04   Ct Chest W Contrast  Result Date: 12/15/2015 CLINICAL DATA:  Increased cough, shortness of breath, weakness and decreased appetite over the past few weeks. Loculated pleural effusion. EXAM: CT CHEST WITH CONTRAST TECHNIQUE: Multidetector CT imaging of the chest was performed during intravenous contrast administration. CONTRAST:  43mL ISOVUE-300 IOPAMIDOL (ISOVUE-300) INJECTION 61% COMPARISON:  05/24/2015. FINDINGS: Cardiovascular: Atherosclerotic calcification of the arterial vasculature. Heart is mildly enlarged. Large pericardial effusion is new from 05/24/2015. Mediastinum/Nodes:  Mediastinal lymph nodes are not enlarged by CT size criteria. No hilar or axillary adenopathy. Esophagus is grossly unremarkable. Tiny hiatal hernia. Lungs/Pleura: Mild volume loss in the medial aspects of the right middle lobe and lingula. Minimal dependent atelectasis bilaterally. Lungs are otherwise clear. No pleural fluid. Airway is unremarkable. Upper Abdomen: Visualized portions of the liver, adrenal glands, kidneys, spleen, pancreas, stomach and bowel are grossly unremarkable with exception of a tiny hiatal hernia. Upper abdominal lymph nodes are not enlarged by CT size criteria. Musculoskeletal: No worrisome lytic or sclerotic lesions. Degenerative changes are seen in the spine. IMPRESSION: 1. Large pericardial effusion, new from 05/24/2015. These results will be called to the ordering clinician or representative by the Radiologist Assistant, and communication documented in the PACS or zVision Dashboard. 2.  Aortic atherosclerosis (ICD10-170.0). Electronically Signed   By: Lorin Picket M.D.   On: 12/15/2015 16:08    ECG  Afib, low voltage  ASSESSMENT AND PLAN 75 yo female w/ pAF, RA (on embrel/mtx/pred), HTN and HLD who presents after finding a moderate circumferential pericardial effusion on a CT scan.   # Pericardial effusion: no pulsus paradoxus on exam, no JVD. No clinical evidence of tamponade. 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 Less likely infectious given no systemic signs, however is on immunsuppression - recommend repeat TTE in morning - depending on results, will need either gentle diuresis or pericardiocentesis  - would hold warfarin, and start on heparin gtt or lovenox in case of pericardiocentesis  # Afib - continue home rate control and hold warfarin, start heparin gtt or lovenox  Signed, Charlies Silvers, MD

## 2015-12-15 NOTE — Telephone Encounter (Signed)
Ct Chest W Contrast  Result Date: 12/15/2015 CLINICAL DATA:  Increased cough, shortness of breath, weakness and decreased appetite over the past few weeks. Loculated pleural effusion. EXAM: CT CHEST WITH CONTRAST TECHNIQUE: Multidetector CT imaging of the chest was performed during intravenous contrast administration. CONTRAST:  45mL ISOVUE-300 IOPAMIDOL (ISOVUE-300) INJECTION 61% COMPARISON:  05/24/2015. FINDINGS: Cardiovascular: Atherosclerotic calcification of the arterial vasculature. Heart is mildly enlarged. Large pericardial effusion is new from 05/24/2015. Mediastinum/Nodes: Mediastinal lymph nodes are not enlarged by CT size criteria. No hilar or axillary adenopathy. Esophagus is grossly unremarkable. Tiny hiatal hernia. Lungs/Pleura: Mild volume loss in the medial aspects of the right middle lobe and lingula. Minimal dependent atelectasis bilaterally. Lungs are otherwise clear. No pleural fluid. Airway is unremarkable. Upper Abdomen: Visualized portions of the liver, adrenal glands, kidneys, spleen, pancreas, stomach and bowel are grossly unremarkable with exception of a tiny hiatal hernia. Upper abdominal lymph nodes are not enlarged by CT size criteria. Musculoskeletal: No worrisome lytic or sclerotic lesions. Degenerative changes are seen in the spine. IMPRESSION: 1. Large pericardial effusion, new from 05/24/2015. These results will be called to the ordering clinician or representative by the Radiologist Assistant, and communication documented in the PACS or zVision Dashboard. 2.  Aortic atherosclerosis (ICD10-170.0). Electronically Signed   By: Lorin Picket M.D.   On: 12/15/2015 16:08    I spoke with pt's husband.  His wife has not come home yet.  Advised him that CT chest has large pericardial effusion, and she needs to go to ER to have this further assessed.  Will have triage f/u with pt to make sure she is aware of need to go to hospital.

## 2015-12-15 NOTE — ED Triage Notes (Signed)
Patient complains of ongoing shortness of breath x 3 weeks. Patient had outpatient CT chest today and was called to come to ED immediately due to large pericardial effusion. Patient complains of thoracic back pain with inspiration and fatigue. Dyspnea with exertion

## 2015-12-15 NOTE — H&P (Signed)
History and Physical    [name]:[id number] DOB: 1940-07-20 DOA: 12/15/2015  Referring MD/NP/PA:   PCP: Leeroy Cha, MD   Patient coming from:  The patient is coming from home.  At baseline, pt is independent for most of ADL.   Chief Complaint: shortness of breath and upper back pain  HPI: [name] is a 75 y.o. female with medical history significant of Hypertension, hyperlipidemia, GERD, rheumatoid arthritis, pulmonary hypertension, atrial fibrillation on Coumadin, recently treated for pneumonia, who presents with shortness of breath and upper back pain  Pt state that she was recently treated for pneumonia. She completed 7 day course of Levaquin. She does not have to fever, chills, chest pain today, but continues to have SOB and dry cough. She states that she has been having shortness of breath in the past 3 weeks. It is aggravated when exertion. She also has upper back pain, which is constant, 10 out of 10 in severity, nonradiating. It is not aggravated or alleviated by any known factors. She has been seen and evaluated by pulmonology as an outpatient. Today she had an outpatient CT scan which showed large pericardial effusion. Patient denies nausea, vomiting, abdominal pain, diarrhea, symptoms of UTI or unilateral weakness.  ED Course: pt was found to have WBC 5.8, INR 1.76, electrolytes renal function okay, temperature normal, no tachycardia, hopeless ideation and percent on room air. CT-chest today showed a large pericardial effusion, new from 05/24/2015. Patient is admitted to telemetry bed as inpatient. PCCM, Dr. Ashby Dawes and cardiology, Dr. Clydene Laming by [place].   Review of Systems:   General: no fevers, chills, no changes in body weight, has poor appetite, has fatigue HEENT: no blurry vision, hearing changes or sore throat Respiratory: has dyspnea, coughing, no wheezing CV: no chest pain, no palpitations GI: no nausea, vomiting, abdominal pain, diarrhea,  constipation GU: no dysuria, burning on urination, increased urinary frequency, hematuria  Ext: has leg edema Neuro: no unilateral weakness, numbness, or tingling, no vision change or hearing loss Skin: no rash, no skin tear. MSK: No muscle spasm, no deformity, no limitation of range of movement in spin Heme: No easy bruising.  Travel history: No recent long distant travel.  Allergy:  Allergies  Allergen Reactions  . Arthrotec [Diclofenac-Misoprostol] Diarrhea  . Erythromycin Other (See Comments)    Anaphylaxis   . Morphine Sulfate Other (See Comments)    Erratic behavior; hallucinations  . Diltiazem Hcl Swelling and Rash    Able to tolerate Verapamil  . Gold-Containing Drug Products Rash  . Macrodantin [Nitrofurantoin Macrocrystal] Rash  . Naproxen Rash  . Penicillins Rash    Amoxicillin Has patient had a PCN reaction causing immediate rash, facial/tongue/throat swelling, SOB or lightheadedness with hypotension: Yes Has patient had a PCN reaction causing severe rash involving mucus membranes or skin necrosis: Yes Has patient had a PCN reaction that required hospitalization No Has patient had a PCN reaction occurring within the last 10 years: No If all of the above answers are "NO", then may proceed with Cephalosporin use.     Past Medical History:  Diagnosis Date  . Acne rosacea   . Adhesive capsulitis of left shoulder 1980  . Allergic rhinitis   . Chronic atrial fibrillation ([place])    a. Coumadin anticoagulation - [facility] ([place])  . Esophageal reflux   . History of cardiac catheterization    a. Myoview 2/09: no ischemia; low risk  //  b. [place] 3/12: normal coronary arteries  . History  of echocardiogram    a. Echo 4/17:  EF 55-60%, no RWMA, mild MR, mild LAE, PASP 48 mmHg, trivial effusion post to heart  . Hypercholesterolemia   . Hyperlipidemia   . Hypertension   . Osteopenia   . Pleural effusion associated with pulmonary infection    Admx 5/17 >> required  L thoracentesis (cytology neg for malignancy)   . Polymorphic light eruption 2004  . Prolonged QT interval   . Pulmonary HTN   . Rheumatoid arthritis ([place]) 1975  . Right middle ear infection 1986    Past Surgical History:  Procedure Laterality Date  . ABDOMINAL HYSTERECTOMY    . CARDIAC CATHETERIZATION    . CARDIOVERSION    . TEMPOROMANDIBULAR JOINT SURGERY      Social History:  reports that she has never smoked. She has never used smokeless tobacco. She reports that she does not drink alcohol or use drugs.  Family History:  Family History  Problem Relation Age of Onset  . Heart disease Father   . Heart disease Mother   . Other Mother     prolonged qtc  . Heart disease Sister   . Heart disease Sister   . Healthy Sister   . Fainting Neg Hx      Prior to Admission medications   Medication Sig Start Date End Date Taking? Authorizing Provider  acidophilus (RISAQUAD) CAPS capsule Take 1 capsule by mouth daily.    Historical Provider, MD  cholecalciferol (VITAMIN D) 1000 UNITS tablet Take 2,000 Units by mouth daily.    Historical Provider, MD  etanercept (ENBREL) 25 MG injection Inject 25 mg into the skin once a week.    Historical Provider, MD  fluticasone (FLONASE) 50 MCG/ACT nasal spray Place 2 sprays into both nostrils daily as needed for allergies or rhinitis.     Historical Provider, MD  folic acid (FOLVITE) 1 MG tablet Take 2 mg by mouth daily.     Historical Provider, MD  furosemide (LASIX) 40 MG tablet TAKE 1 TABLET BY MOUTH EVERY DAY OR AS DIRECTED 08/16/15   Jettie Booze, MD  methotrexate (RHEUMATREX) 2.5 MG tablet Take three (3) tablets (7.5 mg total) by mouth every Tuesday; Take two (2) tablets (5 mg total) by mouth every Wednesday. 08/29/14   Historical Provider, MD  metoprolol succinate (TOPROL-XL) 50 MG 24 hr tablet TAKE 1 TABLET BY MOUTH TWICE DAILY( WITH OR IMMEDIATELY FOLLOWING A MEAL) 11/30/15   Jettie Booze, MD  Multiple Vitamins-Minerals  (MULTIVITAMIN WITH MINERALS) tablet Take 1 tablet by mouth daily.    Historical Provider, MD  omeprazole (PRILOSEC) 20 MG capsule Take 20 mg by mouth every morning.     Historical Provider, MD  potassium chloride (K-DUR,KLOR-CON) 10 MEQ tablet Take 10 mEq by mouth daily.    Historical Provider, MD  pravastatin (PRAVACHOL) 20 MG tablet TAKE 1 TABLET BY MOUTH EVERY EVENING 06/29/15   Jettie Booze, MD  predniSONE (DELTASONE) 5 MG tablet Take 2 tablet for 2 days then resume prior home dose of 2.5 mg daily 06/01/15   Belkys A Regalado, MD  traMADol (ULTRAM) 50 MG tablet Take 1 tablet (50 mg total) by mouth every 6 (six) hours as needed for severe pain. 08/06/14   Velvet Bathe, MD  verapamil (CALAN) 40 MG tablet Take 1 tablet (40 mg total) by mouth every 12 (twelve) hours. 10/01/15   Liliane Shi, PA-C  warfarin (COUMADIN) 5 MG tablet TAKE 1 TABLET BY MOUTH DAILY OR AS DIRECTED 11/01/15  Jettie Booze, MD    Physical Exam: Vitals:   12/15/15 1915 12/15/15 1930 12/15/15 1945 12/15/15 2000  BP: 106/76 112/58 114/83 109/79  Pulse: (!) 52 92 73 (!) 52  Resp: 22 19 19 22   Temp:      TempSrc:      SpO2: 100% 99% 100% 100%  Weight:      Height:       General: Not in acute distress HEENT:       Eyes: PERRL, EOMI, no scleral icterus.       ENT: No discharge from the ears and nose, no pharynx injection, no tonsillar enlargement.        Neck: No JVD, no bruit, no mass felt. Heme: No neck lymph node enlargement. Cardiac: S1/S2, RRR, No murmurs, No gallops or rubs. Respiratory: No rales, wheezing, rhonchi or rubs. GI: Soft, nondistended, nontender, no rebound pain, no organomegaly, BS present. GU: No hematuria Ext: 1+ pitting leg edema bilaterally. 2+DP/PT pulse bilaterally. Musculoskeletal: No joint deformities, No joint redness or warmth, no limitation of ROM in spin. Skin: No rashes.  Neuro: Alert, oriented X3, cranial nerves II-XII grossly intact, moves all extremities normally.    Psych: Patient is not psychotic, no suicidal or hemocidal ideation.  Labs on Admission: I have personally reviewed following labs and imaging studies  CBC:  Recent Labs Lab 12/15/15 1955  WBC 5.8  HGB 10.6*  HCT 32.4*  MCV 93.6  PLT 505   Basic Metabolic Panel:  Recent Labs Lab 12/13/15 1704 12/15/15 1955  NA 140 137  K 3.6 3.7  CL 103 107  CO2 30 24  GLUCOSE 99 93  BUN 9 5*  CREATININE 0.77 0.72  CALCIUM 9.1 8.6*   GFR: Estimated Creatinine Clearance: 56.1 mL/min (by C-G formula based on SCr of 0.72 mg/dL). Liver Function Tests:  Recent Labs Lab 12/15/15 1955  AST 26  ALT 17  ALKPHOS 62  BILITOT 0.4  PROT 5.8*  ALBUMIN 3.1*   No results for input(s): LIPASE, AMYLASE in the last 168 hours. No results for input(s): AMMONIA in the last 168 hours. Coagulation Profile:  Recent Labs Lab 12/15/15 1955  INR 1.76   Cardiac Enzymes: No results for input(s): CKTOTAL, CKMB, CKMBINDEX, TROPONINI in the last 168 hours. BNP (last 3 results)  Recent Labs  12/13/15 1704  PROBNP 215.0*   HbA1C: No results for input(s): HGBA1C in the last 72 hours. CBG: No results for input(s): GLUCAP in the last 168 hours. Lipid Profile: No results for input(s): CHOL, HDL, LDLCALC, TRIG, CHOLHDL, LDLDIRECT in the last 72 hours. Thyroid Function Tests: No results for input(s): TSH, T4TOTAL, FREET4, T3FREE, THYROIDAB in the last 72 hours. Anemia Panel: No results for input(s): VITAMINB12, FOLATE, FERRITIN, TIBC, IRON, RETICCTPCT in the last 72 hours. Urine analysis:    Component Value Date/Time   COLORURINE AMBER (A) 05/23/2015 1941   APPEARANCEUR CLEAR 05/23/2015 1941   LABSPEC 1.021 05/23/2015 1941   PHURINE 6.5 05/23/2015 1941   GLUCOSEU NEGATIVE 05/23/2015 1941   HGBUR MODERATE (A) 05/23/2015 1941   BILIRUBINUR NEGATIVE 05/23/2015 1941   KETONESUR NEGATIVE 05/23/2015 1941   PROTEINUR 30 (A) 05/23/2015 1941   UROBILINOGEN 0.2 10/29/2013 1932   NITRITE NEGATIVE  05/23/2015 1941   LEUKOCYTESUR NEGATIVE 05/23/2015 1941   Sepsis Labs: @LABRCNTIP (procalcitonin:4,lacticidven:4) )No results found for this or any previous visit (from the past 240 hour(s)).   Radiological Exams on Admission: Ct Chest W Contrast  Result Date: 12/15/2015 CLINICAL DATA:  Increased  cough, shortness of breath, weakness and decreased appetite over the past few weeks. Loculated pleural effusion. EXAM: CT CHEST WITH CONTRAST TECHNIQUE: Multidetector CT imaging of the chest was performed during intravenous contrast administration. CONTRAST:  26m ISOVUE-300 IOPAMIDOL (ISOVUE-300) INJECTION 61% COMPARISON:  05/24/2015. FINDINGS: Cardiovascular: Atherosclerotic calcification of the arterial vasculature. Heart is mildly enlarged. Large pericardial effusion is new from 05/24/2015. Mediastinum/Nodes: Mediastinal lymph nodes are not enlarged by CT size criteria. No hilar or axillary adenopathy. Esophagus is grossly unremarkable. Tiny hiatal hernia. Lungs/Pleura: Mild volume loss in the medial aspects of the right middle lobe and lingula. Minimal dependent atelectasis bilaterally. Lungs are otherwise clear. No pleural fluid. Airway is unremarkable. Upper Abdomen: Visualized portions of the liver, adrenal glands, kidneys, spleen, pancreas, stomach and bowel are grossly unremarkable with exception of a tiny hiatal hernia. Upper abdominal lymph nodes are not enlarged by CT size criteria. Musculoskeletal: No worrisome lytic or sclerotic lesions. Degenerative changes are seen in the spine. IMPRESSION: 1. Large pericardial effusion, new from 05/24/2015. These results will be called to the ordering clinician or representative by the Radiologist Assistant, and communication documented in the PACS or zVision Dashboard. 2.  Aortic atherosclerosis (ICD10-170.0). Electronically Signed   By: MLorin PicketM.D.   On: 12/15/2015 16:08     EKG: Independently reviewed.  Atrial fibrillation, QTC 392, low voltage  diffusely  Assessment/Plan Principal Problem:   Pericardial effusion Active Problems:   Atrial fibrillation (HCC)   Long term current use of anticoagulant therapy   Hypertension   Hyperlipidemia   Rheumatoid arthritis (HCC)   GERD (gastroesophageal reflux disease)   Pericardial effusion: Most likely secondary to rheumatoid arthritis. No pulsus paradoxus on exam, no JVD. No clinical evidence of tamponade. Currently stable hemodynamically. Cardiology, Dr. WElson Areaswas consulted--> recommended TTE and switch clonidine to heparin in case patient needs pericardiocentesis.  - Admit to telemetry bed as inpatient - Highly appreciate cardiologist recommendation - Colchicine 0.636mBID - ASA 650 mg q6h - ESR/CRP  Atrial Fibrillation: CHA2DS2-VASc Score is 4, needs oral anticoagulation. Patient is on Coumadin at home. INR is 1.76 on admission. Heart rate is well controlled. -will switch to IV heparin per card -Continue metoprolol and verapamil  Rheumatoid arthritis: On prednisone 2.5 mg daily, methotrexate and Etenercept. Clinically stable. Liver function normal -Continue methotrexate and prednisone -Hold Etenercept -Solu Cortef 50 mg x 1 as stress dose -Check cortisol level  HLD: Last LDL was 79 on 05/21/13  -Continue home medications: Pravastatin   GERD: -Protonix  HTN: -continue metoprolol, Verapamil, and Lasix  Shortness of breath: Likely due to multifactorial, including pericardial effusion, recent pneumonia. No signs of infection. -Albuterol nebs when necessary -check BNP to r/o CHF   DVT ppx: SCD Code Status: Full code Family Communication: Yes, patient's daughter and 2 sisters at bed side Disposition Plan:  Anticipate discharge back to previous home environment Consults called:  PCCM, Dr. RaAshby Dawesnd cardiology, Dr. WoClydene Lamingy EDP Admission status: Obs / tele  Inpatient/tele   medical floor/obs     SDU/inpation       Date of Service 12/15/2015    NIIvor Costariad  Hospitalists Pager [phone] 7PM-7AM, please contact night-coverage [email or web address] Password [email or web address] 12/15/2015, 9:35 PM

## 2015-12-15 NOTE — Progress Notes (Signed)
Reviewed and discussed with Tammy in clinic. Plan CT chest

## 2015-12-15 NOTE — Telephone Encounter (Signed)
Received a call from [place] at the [facility] - call report on the patient's CT chest Will send to Dr Halford Chessman to advise if anything needs to be done today, otherwise results will be reviewed by Tammy Parrett 12/16/15.

## 2015-12-15 NOTE — ED Provider Notes (Signed)
[facility] DEPT Provider Note   CSN: [id number] Arrival date & time: 12/15/15  1752     History   Chief Complaint Chief Complaint  Patient presents with  . Shortness of Breath/pericardial effusion    HPI [name] is a 75 y.o. female.  HPI  This is a 75 year old female with a history of rheumatoid arthritis, healthcare associated pneumonia complicated by exudative pleural effusion, chronic atrial fibrillation on chronic anticoagulation, and history of respiratory failure who presents today with increasing dyspnea over the past 3 weeks. She has been seen and evaluated as an outpatient. Today she had an outpatient CT scan. She was called and told to come to the ED due to a large pericardial effusion. She endorses that she has been getting increasingly short of breath over the past several weeks. She is having dyspnea on exertion and is more dyspneic with trying to lay flat. States that she has had some recent upper respiratory infection. She had pneumonia treated with Levaquin within the past several weeks. She denies any current fever or chills. She is not having chest pain. Review of chart reveals his from 2 days ago with normal be met, last INR 2.2 on November 10.  Echocardiogram of September 17 shows trivial pericardial effusion, ejection fraction of 60-65% no wall motion abnormality.  Past Medical History:  Diagnosis Date  . Acne rosacea   . Adhesive capsulitis of left shoulder 1980  . Allergic rhinitis   . Chronic atrial fibrillation ([place])    a. Coumadin anticoagulation - [facility] ([place])  . Esophageal reflux   . History of cardiac catheterization    a. Myoview 2/09: no ischemia; low risk  //  b. [place] 3/12: normal coronary arteries  . History of echocardiogram    a. Echo 4/17:  EF 55-60%, no RWMA, mild MR, mild LAE, PASP 48 mmHg, trivial effusion post to heart  . Hypercholesterolemia   . Hyperlipidemia   . Hypertension   . Osteopenia   . Pleural  effusion associated with pulmonary infection    Admx 5/17 >> required L thoracentesis (cytology neg for malignancy)   . Polymorphic light eruption 2004  . Prolonged QT interval   . Pulmonary HTN   . Rheumatoid arthritis ([place]) 1975  . Right middle ear infection 1986    Patient Active Problem List   Diagnosis Date Noted  . Pulmonary hypertension 06/08/2015  . Persistent atrial fibrillation ([place])   . Long term (current) use of anticoagulants   . Long QT interval   . Chest tube in place   . Exudative pleural effusion   . HCAP (healthcare-associated pneumonia) 05/23/2015  . Acute respiratory failure with hypoxia ([place]) 05/23/2015  . Pleural effusion, left 05/23/2015  . Pleuritic chest pain 05/23/2015  . Acute respiratory failure ([place]) 05/23/2015  . Hypokalemia 04/24/2015  . Hypomagnesemia 04/24/2015  . QT prolongation 04/24/2015  . Rheumatoid arthritis ([place]) 04/24/2015  . Chest pain 08/03/2014  . CAP (community acquired pneumonia) 08/03/2014  . Encounter for therapeutic drug monitoring 07/14/2013  . Hypertension 05/15/2013  . Hyperlipidemia 05/15/2013  . Ankle edema 05/15/2013  . Atrial fibrillation ([place]) 11/07/2012  . Long term current use of anticoagulant therapy 11/07/2012    Past Surgical History:  Procedure Laterality Date  . ABDOMINAL HYSTERECTOMY    . CARDIAC CATHETERIZATION    . CARDIOVERSION    . TEMPOROMANDIBULAR JOINT SURGERY      OB History    No data available       Home  Medications    Prior to Admission medications   Medication Sig Start Date End Date Taking? Authorizing Provider  acidophilus (RISAQUAD) CAPS capsule Take 1 capsule by mouth daily.    Historical Provider, MD  cholecalciferol (VITAMIN D) 1000 UNITS tablet Take 2,000 Units by mouth daily.    Historical Provider, MD  etanercept (ENBREL) 25 MG injection Inject 25 mg into the skin once a week.    Historical Provider, MD  fluticasone (FLONASE) 50 MCG/ACT nasal spray Place 2 sprays into both  nostrils daily as needed for allergies or rhinitis.     Historical Provider, MD  folic acid (FOLVITE) 1 MG tablet Take 2 mg by mouth daily.     Historical Provider, MD  furosemide (LASIX) 40 MG tablet TAKE 1 TABLET BY MOUTH EVERY DAY OR AS DIRECTED 08/16/15   Jettie Booze, MD  methotrexate (RHEUMATREX) 2.5 MG tablet Take three (3) tablets (7.5 mg total) by mouth every Tuesday; Take two (2) tablets (5 mg total) by mouth every Wednesday. 08/29/14   Historical Provider, MD  metoprolol succinate (TOPROL-XL) 50 MG 24 hr tablet TAKE 1 TABLET BY MOUTH TWICE DAILY( WITH OR IMMEDIATELY FOLLOWING A MEAL) 11/30/15   Jettie Booze, MD  Multiple Vitamins-Minerals (MULTIVITAMIN WITH MINERALS) tablet Take 1 tablet by mouth daily.    Historical Provider, MD  omeprazole (PRILOSEC) 20 MG capsule Take 20 mg by mouth every morning.     Historical Provider, MD  potassium chloride (K-DUR,KLOR-CON) 10 MEQ tablet Take 10 mEq by mouth daily.    Historical Provider, MD  pravastatin (PRAVACHOL) 20 MG tablet TAKE 1 TABLET BY MOUTH EVERY EVENING 06/29/15   Jettie Booze, MD  predniSONE (DELTASONE) 5 MG tablet Take 2 tablet for 2 days then resume prior home dose of 2.5 mg daily 06/01/15   Belkys A Regalado, MD  traMADol (ULTRAM) 50 MG tablet Take 1 tablet (50 mg total) by mouth every 6 (six) hours as needed for severe pain. 08/06/14   Velvet Bathe, MD  verapamil (CALAN) 40 MG tablet Take 1 tablet (40 mg total) by mouth every 12 (twelve) hours. 10/01/15   Liliane Shi, PA-C  warfarin (COUMADIN) 5 MG tablet TAKE 1 TABLET BY MOUTH DAILY OR AS DIRECTED 11/01/15   Jettie Booze, MD    Family History Family History  Problem Relation Age of Onset  . Heart disease Father   . Heart disease Mother   . Other Mother     prolonged qtc  . Heart disease Sister   . Heart disease Sister   . Healthy Sister   . Fainting Neg Hx     Social History Social History  Substance Use Topics  . Smoking status: Never Smoker   . Smokeless tobacco: Never Used  . Alcohol use No     Allergies   Arthrotec [diclofenac-misoprostol]; Erythromycin; Morphine sulfate; Diltiazem hcl; Gold-containing drug products; Macrodantin [nitrofurantoin macrocrystal]; Naproxen; and Penicillins   Review of Systems Review of Systems  Constitutional: Positive for activity change. Negative for unexpected weight change.  HENT: Negative.   Eyes: Negative.   Respiratory: Positive for shortness of breath.   Cardiovascular: Negative.   Gastrointestinal: Negative.   Endocrine: Negative.   Genitourinary: Negative.   Musculoskeletal: Negative.   Skin: Negative.   Neurological: Negative.   Hematological: Negative.   Psychiatric/Behavioral: Negative.   All other systems reviewed and are negative.    Physical Exam Updated Vital Signs BP 112/63 (BP Location: Left Arm)   Pulse 74  Temp 98.3 F (36.8 C) (Oral)   Resp 22   Ht _0  (1.6 m)   Wt 65.3 kg   SpO2 100%   BMI 25.51 kg/m   Physical Exam   ED Treatments / Results  Labs (all labs ordered are listed, but only abnormal results are displayed) Labs Reviewed  CBC  PROTIME-INR  BASIC METABOLIC PANEL    EKG  EKG Interpretation  Date/Time:  Wednesday December 15 2015 18:08:57 EST Ventricular Rate:  113 PR Interval:    QRS Duration: 60 QT Interval:  286 QTC Calculation: 392 R Axis:   15 Text Interpretation:  Atrial fibrillation with rapid ventricular response Low voltage QRS Nonspecific T wave abnormality Abnormal ECG Confirmed by Dahlila Pfahler MD, Andee Poles [phone]) on 12/15/2015 6:32:01 PM       Radiology Ct Chest W Contrast  Result Date: 12/15/2015 CLINICAL DATA:  Increased cough, shortness of breath, weakness and decreased appetite over the past few weeks. Loculated pleural effusion. EXAM: CT CHEST WITH CONTRAST TECHNIQUE: Multidetector CT imaging of the chest was performed during intravenous contrast administration. CONTRAST:  69m ISOVUE-300 IOPAMIDOL  (ISOVUE-300) INJECTION 61% COMPARISON:  05/24/2015. FINDINGS: Cardiovascular: Atherosclerotic calcification of the arterial vasculature. Heart is mildly enlarged. Large pericardial effusion is new from 05/24/2015. Mediastinum/Nodes: Mediastinal lymph nodes are not enlarged by CT size criteria. No hilar or axillary adenopathy. Esophagus is grossly unremarkable. Tiny hiatal hernia. Lungs/Pleura: Mild volume loss in the medial aspects of the right middle lobe and lingula. Minimal dependent atelectasis bilaterally. Lungs are otherwise clear. No pleural fluid. Airway is unremarkable. Upper Abdomen: Visualized portions of the liver, adrenal glands, kidneys, spleen, pancreas, stomach and bowel are grossly unremarkable with exception of a tiny hiatal hernia. Upper abdominal lymph nodes are not enlarged by CT size criteria. Musculoskeletal: No worrisome lytic or sclerotic lesions. Degenerative changes are seen in the spine. IMPRESSION: 1. Large pericardial effusion, new from 05/24/2015. These results will be called to the ordering clinician or representative by the Radiologist Assistant, and communication documented in the PACS or zVision Dashboard. 2.  Aortic atherosclerosis (ICD10-170.0). Electronically Signed   By: MLorin PicketM.D.   On: 12/15/2015 16:08    Procedures Procedures (including critical care time)  Medications Ordered in ED Medications - No data to display   Initial Impression / Assessment and Plan / ED Course  I have reviewed the triage vital signs and the nursing notes.  Pertinent labs & imaging results that were available during my care of the patient were reviewed by me and considered in my medical decision making (see chart for details).  Clinical Course   This is a 75year old female with pericardial effusion. She is hemodynamically stable here. I discussed her care with Dr. NBlaine Hamperon call for hospitalist and his doctor was sick on call for cardiology.  They will see and admit.      Final Clinical Impressions(s) / ED Diagnoses   Final diagnoses:  Pericardial effusion    New Prescriptions New Prescriptions   No medications on file     DPattricia Boss MD 12/15/15 2035

## 2015-12-15 NOTE — ED Notes (Signed)
Pt denies any pain while sitting.  States back pain when bending over and increased movement

## 2015-12-16 ENCOUNTER — Inpatient Hospital Stay (HOSPITAL_COMMUNITY): Payer: Medicare Other

## 2015-12-16 ENCOUNTER — Encounter (HOSPITAL_COMMUNITY)
Admission: EM | Disposition: A | Discharge status: Home / Self Care | Payer: Self-pay | Source: Home / Self Care | Attending: Internal Medicine

## 2015-12-16 ENCOUNTER — Encounter (HOSPITAL_COMMUNITY): Payer: Self-pay | Admitting: General Practice

## 2015-12-16 DIAGNOSIS — R0600 Dyspnea, unspecified: Secondary | ICD-10-CM

## 2015-12-16 DIAGNOSIS — R0609 Other forms of dyspnea: Secondary | ICD-10-CM

## 2015-12-16 DIAGNOSIS — J90 Pleural effusion, not elsewhere classified: Secondary | ICD-10-CM

## 2015-12-16 DIAGNOSIS — I319 Disease of pericardium, unspecified: Secondary | ICD-10-CM

## 2015-12-16 HISTORY — PX: CARDIAC CATHETERIZATION: SHX172

## 2015-12-16 LAB — BODY FLUID CELL COUNT WITH DIFFERENTIAL
EOS FL: 0 %
Lymphs, Fluid: 90 %
Monocyte-Macrophage-Serous Fluid: 2 % — ABNORMAL LOW (ref 50–90)
Neutrophil Count, Fluid: 8 % (ref 0–25)
Total Nucleated Cell Count, Fluid: 7800 cu mm — ABNORMAL HIGH (ref 0–1000)

## 2015-12-16 LAB — BASIC METABOLIC PANEL
ANION GAP: 6 (ref 5–15)
BUN: 5 mg/dL — ABNORMAL LOW (ref 6–20)
CHLORIDE: 108 mmol/L (ref 101–111)
CO2: 25 mmol/L (ref 22–32)
Calcium: 8.8 mg/dL — ABNORMAL LOW (ref 8.9–10.3)
Creatinine, Ser: 0.71 mg/dL (ref 0.44–1.00)
GFR calc Af Amer: >60 mL/min (ref >60)
Glucose, Bld: 101 mg/dL — ABNORMAL HIGH (ref 65–99)
POTASSIUM: 3.7 mmol/L (ref 3.5–5.1)
Sodium: 139 mmol/L (ref 135–145)

## 2015-12-16 LAB — ECHOCARDIOGRAM LIMITED
HEIGHTINCHES: 63 in
Weight: 2370.39 oz

## 2015-12-16 LAB — ECHOCARDIOGRAM COMPLETE
HEIGHTINCHES: 63 in
WEIGHTICAEL: 2370.39 [oz_av]

## 2015-12-16 LAB — CORTISOL-AM, BLOOD: Cortisol - AM: 8.7 ug/dL (ref 6.7–22.6)

## 2015-12-16 LAB — PROTEIN, BODY FLUID: Total protein, fluid: 3.7 g/dL

## 2015-12-16 LAB — CBC
HEMATOCRIT: 35.3 % — AB (ref 36.0–46.0)
Hemoglobin: 11.2 g/dL — ABNORMAL LOW (ref 12.0–15.0)
MCH: 29.8 pg (ref 26.0–34.0)
MCHC: 31.7 g/dL (ref 30.0–36.0)
MCV: 93.9 fL (ref 78.0–100.0)
Platelets: 289 10*3/uL (ref 150–400)
RBC: 3.76 MIL/uL — AB (ref 3.87–5.11)
RDW: 15.5 % (ref 11.5–15.5)
WBC: 4.6 10*3/uL (ref 4.0–10.5)

## 2015-12-16 LAB — GRAM STAIN

## 2015-12-16 LAB — HEPARIN LEVEL (UNFRACTIONATED): Heparin Unfractionated: 0.28 IU/mL — ABNORMAL LOW (ref 0.30–0.70)

## 2015-12-16 LAB — GLUCOSE, SEROUS FLUID: Glucose, Fluid: 62 mg/dL

## 2015-12-16 LAB — CREATININE, FLUID (PLEURAL, PERITONEAL, JP DRAINAGE): Creat, Fluid: 0.6 mg/dL

## 2015-12-16 LAB — LACTATE DEHYDROGENASE, PLEURAL OR PERITONEAL FLUID: LD FL: 1925 U/L — AB (ref 3–23)

## 2015-12-16 LAB — GLUCOSE, RANDOM: GLUCOSE: 84 mg/dL (ref 65–99)

## 2015-12-16 LAB — MRSA PCR SCREENING: MRSA BY PCR: POSITIVE — AB

## 2015-12-16 LAB — SEDIMENTATION RATE: SED RATE: 38 mm/h — AB (ref 0–22)

## 2015-12-16 SURGERY — PERICARDIOCENTESIS
Anesthesia: LOCAL

## 2015-12-16 MED ORDER — LIDOCAINE HCL (PF) 1 % IJ SOLN
INTRAMUSCULAR | Status: AC
  Filled 2015-12-16: qty 30

## 2015-12-16 MED ORDER — LIDOCAINE HCL (PF) 1 % IJ SOLN
INTRAMUSCULAR | Status: DC | PRN
  Administered 2015-12-16: 8 mL

## 2015-12-16 MED ORDER — MIDAZOLAM HCL 2 MG/2ML IJ SOLN
INTRAMUSCULAR | Status: DC | PRN
  Administered 2015-12-16: 1 mg via INTRAVENOUS

## 2015-12-16 MED ORDER — SODIUM CHLORIDE 0.9 % IV SOLN: 250.0000 mL | INTRAVENOUS | Status: DC | PRN

## 2015-12-16 MED ORDER — CHLORHEXIDINE GLUCONATE CLOTH 2 % EX PADS
6.0000 | MEDICATED_PAD | Freq: Every day | CUTANEOUS | Status: DC
  Administered 2015-12-17 – 2015-12-19 (×3): 6 via TOPICAL

## 2015-12-16 MED ORDER — SODIUM CHLORIDE 0.9 % IV SOLN: INTRAVENOUS | Status: DC

## 2015-12-16 MED ORDER — HEPARIN (PORCINE) IN NACL 2-0.9 UNIT/ML-% IJ SOLN
INTRAMUSCULAR | Status: AC
  Filled 2015-12-16: qty 500

## 2015-12-16 MED ORDER — FENTANYL CITRATE (PF) 100 MCG/2ML IJ SOLN
INTRAMUSCULAR | Status: AC
  Filled 2015-12-16: qty 2

## 2015-12-16 MED ORDER — METOPROLOL SUCCINATE ER 50 MG PO TB24
75.0000 mg | ORAL_TABLET | Freq: Two times a day (BID) | ORAL | Status: DC
  Administered 2015-12-17 – 2015-12-19 (×4): 75 mg via ORAL
  Filled 2015-12-16 (×5): qty 1

## 2015-12-16 MED ORDER — HEPARIN (PORCINE) IN NACL 2-0.9 UNIT/ML-% IJ SOLN
INTRAMUSCULAR | Status: DC | PRN
  Administered 2015-12-16: 19:00:00

## 2015-12-16 MED ORDER — HEPARIN (PORCINE) IN NACL 100-0.45 UNIT/ML-% IJ SOLN
1150.0000 [IU]/h | INTRAMUSCULAR | Status: DC
  Administered 2015-12-16: 05:00:00 1000 [IU]/h via INTRAVENOUS
  Filled 2015-12-16: qty 250

## 2015-12-16 MED ORDER — MIDAZOLAM HCL 2 MG/2ML IJ SOLN
INTRAMUSCULAR | Status: AC
  Filled 2015-12-16: qty 2

## 2015-12-16 MED ORDER — FENTANYL CITRATE (PF) 100 MCG/2ML IJ SOLN
INTRAMUSCULAR | Status: DC | PRN
  Administered 2015-12-16 (×2): 25 ug via INTRAVENOUS

## 2015-12-16 MED ORDER — HYDROCODONE-ACETAMINOPHEN 5-325 MG PO TABS: 1.0000 | ORAL_TABLET | ORAL | Status: DC | PRN

## 2015-12-16 MED ORDER — SODIUM CHLORIDE 0.9% FLUSH: 3.0000 mL | Freq: Two times a day (BID) | INTRAVENOUS | Status: DC

## 2015-12-16 MED ORDER — SODIUM CHLORIDE 0.9% FLUSH: 3.0000 mL | INTRAVENOUS | Status: DC | PRN

## 2015-12-16 MED ORDER — MUPIROCIN 2 % EX OINT
1.0000 "application " | TOPICAL_OINTMENT | Freq: Two times a day (BID) | CUTANEOUS | Status: DC
  Administered 2015-12-16 – 2015-12-19 (×6): 1 via NASAL
  Filled 2015-12-16 (×2): qty 22

## 2015-12-16 SURGICAL SUPPLY — 10 items
COVER PRB 48X5XTLSCP FOLD TPE (BAG) ×1 IMPLANT
COVER PROBE 5X48 (BAG) ×1
EVACUATOR 1/8 PVC DRAIN (DRAIN) ×2 IMPLANT
PACK CARDIAC CATHETERIZATION (CUSTOM PROCEDURE TRAY) ×2 IMPLANT
PERIVAC PERICARDIOCENTESIS 8.3 (TRAY / TRAY PROCEDURE) ×2 IMPLANT
PROTECTION STATION PRESSURIZED (MISCELLANEOUS) ×2
STATION PROTECTION PRESSURIZED (MISCELLANEOUS) ×1 IMPLANT
TRANSDUCER W/STOPCOCK (MISCELLANEOUS) ×4 IMPLANT
TUBING ART PRESS 72  MALE/FEM (TUBING) ×1
TUBING ART PRESS 72 MALE/FEM (TUBING) ×1 IMPLANT

## 2015-12-16 NOTE — Consult Note (Signed)
Name: [name] MRN: [id number] DOB: 1940-10-15    ADMISSION DATE:  12/15/2015 CONSULTATION DATE:  12/16/2015  REFERRING MD :  Blaine Hamper, triad  CHIEF COMPLAINT:  Shortness of breath  HISTORY OF PRESENT ILLNESS:  75 year old never smoker with atrial fibrillation and Coumadin and rheumatoid arthritis admitted with shortness of breath for 3 weeks. She was treated for pneumonia earlier in the year in [id number], this was complicated by an exudative effusion, felt to be parapneumonic which eventually resolved and 08/2015. She was also noted to have pulmonary hypertension during the acute illness with PA pressure as high as in the 60s but on a follow-up echo in 09/2015, PA Pressure was noted to be  34. She was recently treated for pneumonia with 7 days of Levaquin. She presented with shortness of breath for 3 weeks and upper back pain. CT angios and was negative for pulmonary embolism but showed a large circumferential pericardial effusion and minimal volume loss in the lingula.  Review of her meds shows Enbrel, methotrexate and low-dose prednisone for rheumatoid arthritis in Coumadin for atrial fibrillation      PAST MEDICAL HISTORY :   has a past medical history of Acne rosacea; Adhesive capsulitis of left shoulder (1980); Allergic rhinitis; Chronic atrial fibrillation ([place]); Esophageal reflux; History of cardiac catheterization; History of echocardiogram; Hypercholesterolemia; Hyperlipidemia; Hypertension; Osteopenia; Pleural effusion associated with pulmonary infection; Polymorphic light eruption (2004); Prolonged QT interval; Pulmonary HTN; Rheumatoid arthritis ([place]) (1975); and Right middle ear infection (1986).  has a past surgical history that includes Cardiac catheterization; Cardioversion; Temporomandibular joint surgery; and Abdominal hysterectomy. Prior to Admission medications   Medication Sig Start Date End Date Taking? Authorizing Provider  acidophilus (RISAQUAD) CAPS capsule Take 1  capsule by mouth daily.   Yes Historical Provider, MD  cholecalciferol (VITAMIN D) 1000 UNITS tablet Take 2,000 Units by mouth daily.   Yes Historical Provider, MD  etanercept (ENBREL) 25 MG injection Inject 25 mg into the skin once a week.   Yes Historical Provider, MD  fluticasone (FLONASE) 50 MCG/ACT nasal spray Place 2 sprays into both nostrils daily as needed for allergies or rhinitis.    Yes Historical Provider, MD  folic acid (FOLVITE) 1 MG tablet Take 2 mg by mouth daily.    Yes Historical Provider, MD  furosemide (LASIX) 40 MG tablet TAKE 1 TABLET BY MOUTH EVERY DAY OR AS DIRECTED 08/16/15  Yes Jettie Booze, MD  guaiFENesin (MUCINEX) 600 MG 12 hr tablet Take by mouth 2 (two) times daily as needed for cough.   Yes Historical Provider, MD  methotrexate (RHEUMATREX) 2.5 MG tablet Take three (3) tablets (7.5 mg total) by mouth every Tuesday; Take two (2) tablets (5 mg total) by mouth every Wednesday. 08/29/14  Yes Historical Provider, MD  metoprolol succinate (TOPROL-XL) 50 MG 24 hr tablet TAKE 1 TABLET BY MOUTH TWICE DAILY( WITH OR IMMEDIATELY FOLLOWING A MEAL) 11/30/15  Yes Jettie Booze, MD  Multiple Vitamins-Minerals (MULTIVITAMIN WITH MINERALS) tablet Take 1 tablet by mouth daily.   Yes Historical Provider, MD  omeprazole (PRILOSEC) 20 MG capsule Take 20 mg by mouth every morning.    Yes Historical Provider, MD  potassium chloride (K-DUR,KLOR-CON) 10 MEQ tablet Take 10 mEq by mouth daily.   Yes Historical Provider, MD  pravastatin (PRAVACHOL) 20 MG tablet TAKE 1 TABLET BY MOUTH EVERY EVENING 06/29/15  Yes Jettie Booze, MD  predniSONE (DELTASONE) 5 MG tablet Take 2 tablet for 2 days then resume prior home dose of  2.5 mg daily Patient taking differently: 2.5 mg daily with breakfast. Take 2 tablet for 2 days then resume prior home dose of 2.5 mg daily 06/01/15  Yes Belkys A Regalado, MD  verapamil (CALAN) 40 MG tablet Take 1 tablet (40 mg total) by mouth every 12 (twelve) hours.  10/01/15  Yes Liliane Shi, PA-C  warfarin (COUMADIN) 5 MG tablet TAKE 1 TABLET BY MOUTH DAILY OR AS DIRECTED 11/01/15  Yes Jettie Booze, MD  traMADol (ULTRAM) 50 MG tablet Take 1 tablet (50 mg total) by mouth every 6 (six) hours as needed for severe pain. Patient not taking: Reported on 12/15/2015 08/06/14   Velvet Bathe, MD   Allergies  Allergen Reactions  . Arthrotec [Diclofenac-Misoprostol] Diarrhea  . Erythromycin Other (See Comments)    Anaphylaxis   . Morphine Sulfate Other (See Comments)    Erratic behavior; hallucinations  . Diltiazem Hcl Swelling and Rash    Able to tolerate Verapamil  . Gold-Containing Drug Products Rash  . Macrodantin [Nitrofurantoin Macrocrystal] Rash  . Naproxen Rash  . Penicillins Rash    Amoxicillin Has patient had a PCN reaction causing immediate rash, facial/tongue/throat swelling, SOB or lightheadedness with hypotension: Yes Has patient had a PCN reaction causing severe rash involving mucus membranes or skin necrosis: Yes Has patient had a PCN reaction that required hospitalization No Has patient had a PCN reaction occurring within the last 10 years: No If all of the above answers are "NO", then may proceed with Cephalosporin use.     FAMILY HISTORY:  family history includes Healthy in her sister; Heart disease in her father, mother, sister, and sister; Other in her mother. SOCIAL HISTORY:  reports that she has never smoked. She has never used smokeless tobacco. She reports that she does not drink alcohol or use drugs.  REVIEW OF SYSTEMS:   Constitutional: Negative for fever, chills, weight loss, malaise/fatigue and diaphoresis.  HENT: Negative for hearing loss, ear pain, nosebleeds, congestion, sore throat, neck pain, tinnitus and ear discharge.   Eyes: Negative for blurred vision, double vision, photophobia, pain, discharge and redness.  Respiratory: Negative for  hemoptysis, sputum production, , wheezing and stridor.     Cardiovascular: Negative for chest pain, palpitations, orthopnea, claudication, leg swelling and PND.  Gastrointestinal: Negative for heartburn, nausea, vomiting, abdominal pain, diarrhea, constipation, blood in stool and melena.  Genitourinary: Negative for dysuria, urgency, frequency, hematuria and flank pain.  Musculoskeletal: Negative for myalgias, back pain, joint pain and falls.  Skin: Negative for itching and rash.  Neurological: Negative for dizziness, tingling, tremors, sensory change, speech change, focal weakness, seizures, loss of consciousness, weakness and headaches.  Endo/Heme/Allergies: Negative for environmental allergies and polydipsia. Does not bruise/bleed easily.  SUBJECTIVE:   VITAL SIGNS: Temp:  [97.9 F (36.6 C)-98.4 F (36.9 C)] 97.9 F (36.6 C) (11/30 0810) Pulse Rate:  [25-140] 103 (11/30 0810) Resp:  [15-25] 15 (11/30 0810) BP: (92-129)/(48-94) 121/87 (11/30 0810) SpO2:  [95 %-100 %] 100 % (11/30 0810) Weight:  [144 lb (65.3 kg)-148 lb 2.4 oz (67.2 kg)] 148 lb 2.4 oz (67.2 kg) (11/30 0205)  PHYSICAL EXAMINATION: Gen. Pleasant, well-nourished, in no distress, normal affect ENT - no lpallor, icterus, no post nasal drip Neck: No JVD, no thyromegaly, no carotid bruits Lungs: no use of accessory muscles, no dullness to percussion, clear without rales or rhonchi  Cardiovascular: Rhythm regular, heart sounds  normal, no murmurs, no peripheral edema Abdomen: soft and non-tender, no hepatosplenomegaly, BS normal. Musculoskeletal: No deformities, no  cyanosis or clubbing Neuro:  alert, non focal Skin:  Warm, no lesions/ rash    Recent Labs Lab 12/13/15 1704 12/15/15 1955 12/16/15 0250  NA 140 137 139  K 3.6 3.7 3.7  CL 103 107 108  CO2 30 24 25   BUN 9 5* 5*  CREATININE 0.77 0.72 0.71  GLUCOSE 99 93 101*    Recent Labs Lab 12/15/15 1955 12/16/15 0250  HGB 10.6* 11.2*  HCT 32.4* 35.3*  WBC 5.8 4.6  PLT 301 289   Ct Chest W Contrast  Result  Date: 12/15/2015 CLINICAL DATA:  Increased cough, shortness of breath, weakness and decreased appetite over the past few weeks. Loculated pleural effusion. EXAM: CT CHEST WITH CONTRAST TECHNIQUE: Multidetector CT imaging of the chest was performed during intravenous contrast administration. CONTRAST:  11mL ISOVUE-300 IOPAMIDOL (ISOVUE-300) INJECTION 61% COMPARISON:  05/24/2015. FINDINGS: Cardiovascular: Atherosclerotic calcification of the arterial vasculature. Heart is mildly enlarged. Large pericardial effusion is new from 05/24/2015. Mediastinum/Nodes: Mediastinal lymph nodes are not enlarged by CT size criteria. No hilar or axillary adenopathy. Esophagus is grossly unremarkable. Tiny hiatal hernia. Lungs/Pleura: Mild volume loss in the medial aspects of the right middle lobe and lingula. Minimal dependent atelectasis bilaterally. Lungs are otherwise clear. No pleural fluid. Airway is unremarkable. Upper Abdomen: Visualized portions of the liver, adrenal glands, kidneys, spleen, pancreas, stomach and bowel are grossly unremarkable with exception of a tiny hiatal hernia. Upper abdominal lymph nodes are not enlarged by CT size criteria. Musculoskeletal: No worrisome lytic or sclerotic lesions. Degenerative changes are seen in the spine. IMPRESSION: 1. Large pericardial effusion, new from 05/24/2015. These results will be called to the ordering clinician or representative by the Radiologist Assistant, and communication documented in the PACS or zVision Dashboard. 2.  Aortic atherosclerosis (ICD10-170.0). Electronically Signed   By: Lorin Picket M.D.   On: 12/15/2015 16:08    ASSESSMENT / PLAN:  Dyspnea- no pulmonary cause identified, pulmonary hypertension is resolved, no ILD noted on imaging, no reason to suspect COPD in this never smoker  Her symptoms may all be related to pericardial effusion-I note that repeat echo is planned with possible pericardiocentesis. Defer to cardiology. I wonder if this  could be related to inflammation from rheumatoid arthritis, less likely bleeding-since her INR when reviewed over the last few months has been within normal range  [name] M available as needed   Kara Mead MD. FCCP. [facility] Pager [phone] If no response call [phone]    12/16/2015, 12:25 PM

## 2015-12-16 NOTE — Progress Notes (Signed)
ANTICOAGULATION CONSULT NOTE - Initial Consult  Pharmacy Consult for Heparin Indication: atrial fibrillation  Allergies  Allergen Reactions  . Arthrotec [Diclofenac-Misoprostol] Diarrhea  . Erythromycin Other (See Comments)    Anaphylaxis   . Morphine Sulfate Other (See Comments)    Erratic behavior; hallucinations  . Diltiazem Hcl Swelling and Rash    Able to tolerate Verapamil  . Gold-Containing Drug Products Rash  . Macrodantin [Nitrofurantoin Macrocrystal] Rash  . Naproxen Rash  . Penicillins Rash    Amoxicillin Has patient had a PCN reaction causing immediate rash, facial/tongue/throat swelling, SOB or lightheadedness with hypotension: Yes Has patient had a PCN reaction causing severe rash involving mucus membranes or skin necrosis: Yes Has patient had a PCN reaction that required hospitalization No Has patient had a PCN reaction occurring within the last 10 years: No If all of the above answers are "NO", then may proceed with Cephalosporin use.     Patient Measurements: Height: 5\' 3"  (160 cm) Weight: 148 lb 2.4 oz (67.2 kg) IBW/kg (Calculated) : 52.4 Heparin Dosing Weight: 67 kg  Vital Signs: Temp: 98.4 F (36.9 C) (11/30 0205) Temp Source: Oral (11/30 0205) BP: 106/62 (11/30 0205) Pulse Rate: 97 (11/30 0205)  Labs:  Recent Labs  12/13/15 1704 12/15/15 1955 12/16/15 0250  HGB  --  10.6* 11.2*  HCT  --  32.4* 35.3*  PLT  --  301 289  LABPROT  --  20.8*  --   INR  --  1.76  --   CREATININE 0.77 0.72  --     Estimated Creatinine Clearance: 56.8 mL/min (by C-G formula based on SCr of 0.72 mg/dL).   Medical History: Past Medical History:  Diagnosis Date  . Acne rosacea   . Adhesive capsulitis of left shoulder 1980  . Allergic rhinitis   . Chronic atrial fibrillation ([place])    a. Coumadin anticoagulation - [facility] ([place])  . Esophageal reflux   . History of cardiac catheterization    a. Myoview 2/09: no ischemia; low risk  //  b. [place]  3/12: normal coronary arteries  . History of echocardiogram    a. Echo 4/17:  EF 55-60%, no RWMA, mild MR, mild LAE, PASP 48 mmHg, trivial effusion post to heart  . Hypercholesterolemia   . Hyperlipidemia   . Hypertension   . Osteopenia   . Pleural effusion associated with pulmonary infection    Admx 5/17 >> required L thoracentesis (cytology neg for malignancy)   . Polymorphic light eruption 2004  . Prolonged QT interval   . Pulmonary HTN   . Rheumatoid arthritis ([place]) 1975  . Right middle ear infection 1986    Medications:  Prescriptions Prior to Admission  Medication Sig Dispense Refill Last Dose  . acidophilus (RISAQUAD) CAPS capsule Take 1 capsule by mouth daily.   12/14/2015 at Unknown time  . cholecalciferol (VITAMIN D) 1000 UNITS tablet Take 2,000 Units by mouth daily.   12/15/2015 at Unknown time  . etanercept (ENBREL) 25 MG injection Inject 25 mg into the skin once a week.   11/26/2015  . fluticasone (FLONASE) 50 MCG/ACT nasal spray Place 2 sprays into both nostrils daily as needed for allergies or rhinitis.    12/14/2015 at Unknown time  . folic acid (FOLVITE) 1 MG tablet Take 2 mg by mouth daily.    12/15/2015 at Unknown time  . furosemide (LASIX) 40 MG tablet TAKE 1 TABLET BY MOUTH EVERY DAY OR AS DIRECTED 30 tablet 10 12/14/2015 at Unknown time  .  guaiFENesin (MUCINEX) 600 MG 12 hr tablet Take by mouth 2 (two) times daily as needed for cough.   12/15/2015 at 8am  . methotrexate (RHEUMATREX) 2.5 MG tablet Take three (3) tablets (7.5 mg total) by mouth every Tuesday; Take two (2) tablets (5 mg total) by mouth every Wednesday.  1 12/14/2015 at Unknown time  . metoprolol succinate (TOPROL-XL) 50 MG 24 hr tablet TAKE 1 TABLET BY MOUTH TWICE DAILY( WITH OR IMMEDIATELY FOLLOWING A MEAL) 180 tablet 1 12/15/2015 at 8am  . Multiple Vitamins-Minerals (MULTIVITAMIN WITH MINERALS) tablet Take 1 tablet by mouth daily.   12/15/2015 at Unknown time  . omeprazole (PRILOSEC) 20 MG capsule  Take 20 mg by mouth every morning.    12/15/2015 at Unknown time  . potassium chloride (K-DUR,KLOR-CON) 10 MEQ tablet Take 10 mEq by mouth daily.   12/14/2015 at Unknown time  . pravastatin (PRAVACHOL) 20 MG tablet TAKE 1 TABLET BY MOUTH EVERY EVENING 30 tablet 11 12/14/2015 at Unknown time  . predniSONE (DELTASONE) 5 MG tablet Take 2 tablet for 2 days then resume prior home dose of 2.5 mg daily (Patient taking differently: 2.5 mg daily with breakfast. Take 2 tablet for 2 days then resume prior home dose of 2.5 mg daily) 10 tablet 0 12/15/2015 at Unknown time  . verapamil (CALAN) 40 MG tablet Take 1 tablet (40 mg total) by mouth every 12 (twelve) hours. 60 tablet 6 12/15/2015 at 8am  . warfarin (COUMADIN) 5 MG tablet TAKE 1 TABLET BY MOUTH DAILY OR AS DIRECTED 90 tablet 1 12/14/2015 at Unknown time  . traMADol (ULTRAM) 50 MG tablet Take 1 tablet (50 mg total) by mouth every 6 (six) hours as needed for severe pain. (Patient not taking: Reported on 12/15/2015) 30 tablet 0 Not Taking at Unknown time    Assessment: 75 y.o. F presents with pericardial effusion. Pt on coumadin PTA. INR 1.76 (subtherapeutic) on admission. Holding coumadin and bridging with heparin in case pericardiocentesis needed. CBC ok on admission.  Home coumadin dose: 5mg  daily except for 2.5mg  on Thur - last dose 11/28  Goal of Therapy:  Heparin level 0.3-0.7 units/ml Monitor platelets by anticoagulation protocol: Yes   Plan:  Heparin gtt at 1000 units/hr. No bolus. Will f/u heparin level in 8 hours Daily heparin level and CBC  Sherlon Handing, PharmD, BCPS Clinical pharmacist, pager [phone] 12/16/2015,4:05 AM

## 2015-12-16 NOTE — Progress Notes (Signed)
PROGRESS NOTE  [name] [id number] DOB: 05-26-1940 DOA: 12/15/2015 PCP: Leeroy Cha, MD   LOS: 1 day   Brief Narrative: [name] is a 75 y.o. female with medical history significant of Hypertension, hyperlipidemia, GERD, rheumatoid arthritis, pulmonary hypertension, atrial fibrillation on Coumadin, recently treated for pneumonia, who presents with shortness of breath and upper back pain, seen in Pulmonary clinic, had a CT scan of the chest which showed significant pericardial effusion and thus directed for admission.  Assessment & Plan: Principal Problem:   Pericardial effusion Active Problems:   Atrial fibrillation (HCC)   Long term current use of anticoagulant therapy   Hypertension   Hyperlipidemia   Rheumatoid arthritis (HCC)   GERD (gastroesophageal reflux disease)    Pericardial effusion  - 2d echo pending, further management depending on findings.  - cardiology following, discussed with Dr. Irish Lack  Atrial Fibrillation  - CHA2DS2-VASc Score is 4, needs oral anticoagulation. Patient is on Coumadin at home. INR is 1.76 on admission. Heart rate is well controlled. - will switch to IV heparin per card - Continue metoprolol and verapamil  Rheumatoid arthritis  - On prednisone 2.5 mg daily, methotrexate and Etenercept. Clinically stable. Liver function normal - Continue methotrexate and prednisone - Hold Etenercept - Solu Cortef 50 mg x 1 as stress dose  HLD - Last LDL was 79 on 05/21/13  - continue home statin  GERD - Protonix  HTN - continue metoprolol, Verapamil, and Lasix  Shortness of breath - Likely due to multifactorial, including pericardial effusion, recent pneumonia. No signs of infection. - Albuterol nebs when necessary    DVT prophylaxis: heparin Code Status: Full Family Communication: no family bedside Disposition Plan: home when ready  Consultants:   Cardiology  Procedures:   2D echo:  pending  Antimicrobials:  None    Subjective: - feels well @ rest, complains of significant dyspnea with activity  Objective: Vitals:   12/16/15 0045 12/16/15 0100 12/16/15 0205 12/16/15 0810  BP: 101/68 109/76 106/62 121/87  Pulse: 81 76 97 (!) 103  Resp: 21 22 18 15   Temp:   98.4 F (36.9 C) 97.9 F (36.6 C)  TempSrc:   Oral Oral  SpO2: 97% 99% 100% 100%  Weight:   67.2 kg (148 lb 2.4 oz)   Height:   5\' 3"  (1.6 m)     Intake/Output Summary (Last 24 hours) at 12/16/15 1122 Last data filed at 12/16/15 [id number]  Gross per 24 hour  Intake                0 ml  Output              450 ml  Net             -450 ml   Filed Weights   12/15/15 1759 12/16/15 0205  Weight: 65.3 kg (144 lb) 67.2 kg (148 lb 2.4 oz)    Examination: Constitutional: NAD Vitals:   12/16/15 0045 12/16/15 0100 12/16/15 0205 12/16/15 0810  BP: 101/68 109/76 106/62 121/87  Pulse: 81 76 97 (!) 103  Resp: 21 22 18 15   Temp:   98.4 F (36.9 C) 97.9 F (36.6 C)  TempSrc:   Oral Oral  SpO2: 97% 99% 100% 100%  Weight:   67.2 kg (148 lb 2.4 oz)   Height:   5\' 3"  (1.6 m)    Eyes: PERRL, lids and conjunctivae normal Respiratory: clear to auscultation bilaterally, no wheezing, no crackles.  Cardiovascular: irregular, no murmurs /  rubs / gallops.   Abdomen: no tenderness. Bowel sounds positive.  Musculoskeletal: no clubbing / cyanosis.  Skin: no rashes, lesions, ulcers. No induration Neurologic: non focal  Psychiatric: Normal judgment and insight. Alert and oriented x 3. Normal mood.    Data Reviewed: I have personally reviewed following labs and imaging studies  CBC:  Recent Labs Lab 12/15/15 1955 12/16/15 0250  WBC 5.8 4.6  HGB 10.6* 11.2*  HCT 32.4* 35.3*  MCV 93.6 93.9  PLT 301 [id number]   Basic Metabolic Panel:  Recent Labs Lab 12/13/15 1704 12/15/15 1955 12/16/15 0250  NA 140 137 139  K 3.6 3.7 3.7  CL 103 107 108  CO2 30 24 25   GLUCOSE 99 93 101*  BUN 9 5* 5*  CREATININE 0.77 0.72  0.71  CALCIUM 9.1 8.6* 8.8*   GFR: Estimated Creatinine Clearance: 56.8 mL/min (by C-G formula based on SCr of 0.71 mg/dL). Liver Function Tests:  Recent Labs Lab 12/15/15 1955  AST 26  ALT 17  ALKPHOS 62  BILITOT 0.4  PROT 5.8*  ALBUMIN 3.1*   No results for input(s): LIPASE, AMYLASE in the last 168 hours. No results for input(s): AMMONIA in the last 168 hours. Coagulation Profile:  Recent Labs Lab 12/15/15 1955  INR 1.76   Cardiac Enzymes: No results for input(s): CKTOTAL, CKMB, CKMBINDEX, TROPONINI in the last 168 hours. BNP (last 3 results)  Recent Labs  12/13/15 1704  PROBNP 215.0*   HbA1C: No results for input(s): HGBA1C in the last 72 hours. CBG: No results for input(s): GLUCAP in the last 168 hours. Lipid Profile: No results for input(s): CHOL, HDL, LDLCALC, TRIG, CHOLHDL, LDLDIRECT in the last 72 hours. Thyroid Function Tests: No results for input(s): TSH, T4TOTAL, FREET4, T3FREE, THYROIDAB in the last 72 hours. Anemia Panel: No results for input(s): VITAMINB12, FOLATE, FERRITIN, TIBC, IRON, RETICCTPCT in the last 72 hours. Urine analysis:    Component Value Date/Time   COLORURINE AMBER (A) 05/23/2015 1941   APPEARANCEUR CLEAR 05/23/2015 1941   LABSPEC 1.021 05/23/2015 1941   PHURINE 6.5 05/23/2015 1941   GLUCOSEU NEGATIVE 05/23/2015 1941   HGBUR MODERATE (A) 05/23/2015 1941   BILIRUBINUR NEGATIVE 05/23/2015 [place] NEGATIVE 05/23/2015 1941   PROTEINUR 30 (A) 05/23/2015 1941   UROBILINOGEN 0.2 10/29/2013 1932   NITRITE NEGATIVE 05/23/2015 1941   LEUKOCYTESUR NEGATIVE 05/23/2015 1941   Sepsis Labs: Invalid input(s): PROCALCITONIN, LACTICIDVEN  No results found for this or any previous visit (from the past 240 hour(s)).    Radiology Studies: Ct Chest W Contrast  Result Date: 12/15/2015 CLINICAL DATA:  Increased cough, shortness of breath, weakness and decreased appetite over the past few weeks. Loculated pleural effusion. EXAM: CT  CHEST WITH CONTRAST TECHNIQUE: Multidetector CT imaging of the chest was performed during intravenous contrast administration. CONTRAST:  76mL ISOVUE-300 IOPAMIDOL (ISOVUE-300) INJECTION 61% COMPARISON:  05/24/2015. FINDINGS: Cardiovascular: Atherosclerotic calcification of the arterial vasculature. Heart is mildly enlarged. Large pericardial effusion is new from 05/24/2015. Mediastinum/Nodes: Mediastinal lymph nodes are not enlarged by CT size criteria. No hilar or axillary adenopathy. Esophagus is grossly unremarkable. Tiny hiatal hernia. Lungs/Pleura: Mild volume loss in the medial aspects of the right middle lobe and lingula. Minimal dependent atelectasis bilaterally. Lungs are otherwise clear. No pleural fluid. Airway is unremarkable. Upper Abdomen: Visualized portions of the liver, adrenal glands, kidneys, spleen, pancreas, stomach and bowel are grossly unremarkable with exception of a tiny hiatal hernia. Upper abdominal lymph nodes are not enlarged by CT size criteria.  Musculoskeletal: No worrisome lytic or sclerotic lesions. Degenerative changes are seen in the spine. IMPRESSION: 1. Large pericardial effusion, new from 05/24/2015. These results will be called to the ordering clinician or representative by the Radiologist Assistant, and communication documented in the PACS or zVision Dashboard. 2.  Aortic atherosclerosis (ICD10-170.0). Electronically Signed   By: Lorin Picket M.D.   On: 12/15/2015 16:08     Scheduled Meds: . acidophilus  1 capsule Oral Daily  . aspirin  325 mg Oral Daily  . cholecalciferol  2,000 Units Oral Daily  . colchicine  0.6 mg Oral BID  . folic acid  2 mg Oral Daily  . furosemide  40 mg Oral Daily  . hydrocortisone sod succinate (SOLU-CORTEF) inj  50 mg Intravenous Once  . metoprolol succinate  50 mg Oral BID  . multivitamin with minerals  1 tablet Oral Daily  . pantoprazole  40 mg Oral Daily  . pravastatin  20 mg Oral QPM  . predniSONE  2.5 mg Oral Q breakfast  .  sodium chloride flush  3 mL Intravenous Q12H  . verapamil  40 mg Oral Q12H   Continuous Infusions: . heparin 1,000 Units/hr (12/16/15 0500)    Marzetta Board, MD, PhD [facility] Pager [phone]  If 7PM-7AM, please contact night-coverage [email or web address] Password [email or web address] 12/16/2015, 11:22 AM

## 2015-12-16 NOTE — H&P (View-Only) (Signed)
Patient Name: [name] Date of Encounter: 12/16/2015  Primary Cardiologist: [facility] Problem List     Principal Problem:   Pericardial effusion Active Problems:   Atrial fibrillation [facility])   Long term current use of anticoagulant therapy   Hypertension   Hyperlipidemia   Rheumatoid arthritis (HCC)   GERD (gastroesophageal reflux disease)     Subjective   No chest pain. Has had some upper back pain and chronic shortness of breath.  Inpatient Medications    Scheduled Meds: . acidophilus  1 capsule Oral Daily  . aspirin  325 mg Oral Daily  . cholecalciferol  2,000 Units Oral Daily  . colchicine  0.6 mg Oral BID  . folic acid  2 mg Oral Daily  . furosemide  40 mg Oral Daily  . hydrocortisone sod succinate (SOLU-CORTEF) inj  50 mg Intravenous Once  . metoprolol succinate  50 mg Oral BID  . multivitamin with minerals  1 tablet Oral Daily  . pantoprazole  40 mg Oral Daily  . pravastatin  20 mg Oral QPM  . predniSONE  2.5 mg Oral Q breakfast  . sodium chloride flush  3 mL Intravenous Q12H  . verapamil  40 mg Oral Q12H   Continuous Infusions: . heparin 1,000 Units/hr (12/16/15 0500)   PRN Meds: acetaminophen **OR** acetaminophen, albuterol, fluticasone, guaiFENesin, metoprolol, traMADol, zolpidem   Vital Signs    Vitals:   12/16/15 0045 12/16/15 0100 12/16/15 0205 12/16/15 0810  BP: 101/68 109/76 106/62 121/87  Pulse: 81 76 97 (!) 103  Resp: 21 22 18 15   Temp:   98.4 F (36.9 C) 97.9 F (36.6 C)  TempSrc:   Oral Oral  SpO2: 97% 99% 100% 100%  Weight:   148 lb 2.4 oz (67.2 kg)   Height:   5\' 3"  (1.6 m)     Intake/Output Summary (Last 24 hours) at 12/16/15 0918 Last data filed at 12/16/15 [id number]  Gross per 24 hour  Intake                0 ml  Output              450 ml  Net             -450 ml   Filed Weights   12/15/15 1759 12/16/15 0205  Weight: 144 lb (65.3 kg) 148 lb 2.4 oz (67.2 kg)    Physical Exam   GEN: Well nourished,  well developed, in no acute distress.  HEENT: Grossly normal.  Neck: Supple, no JVD, carotid bruits, or masses. Cardiac: irregularly irregular, no murmurs, rubs, or gallops. No clubbing, cyanosis, edema.  Radials/DP/PT 2+ and equal bilaterally.  Respiratory:  Respirations regular and unlabored, clear to auscultation bilaterally. GI: Soft, nontender, nondistended, BS + x 4. MS: no deformity or atrophy. Skin: warm and dry, no rash. Neuro:  Strength and sensation are intact. Psych: AAOx3.  Normal affect.  Labs    CBC  Recent Labs  12/15/15 1955 12/16/15 0250  WBC 5.8 4.6  HGB 10.6* 11.2*  HCT 32.4* 35.3*  MCV 93.6 93.9  PLT 301 A999333   Basic Metabolic Panel  Recent Labs  12/15/15 1955 12/16/15 0250  NA 137 139  K 3.7 3.7  CL 107 108  CO2 24 25  GLUCOSE 93 101*  BUN 5* 5*  CREATININE 0.72 0.71  CALCIUM 8.6* 8.8*   Liver Function Tests  Recent Labs  12/15/15 1955  AST 26  ALT 17  ALKPHOS  62  BILITOT 0.4  PROT 5.8*  ALBUMIN 3.1*   No results for input(s): LIPASE, AMYLASE in the last 72 hours. Cardiac Enzymes No results for input(s): CKTOTAL, CKMB, CKMBINDEX, TROPONINI in the last 72 hours. BNP Invalid input(s): POCBNP D-Dimer No results for input(s): DDIMER in the last 72 hours. Hemoglobin A1C No results for input(s): HGBA1C in the last 72 hours. Fasting Lipid Panel No results for input(s): CHOL, HDL, LDLCALC, TRIG, CHOLHDL, LDLDIRECT in the last 72 hours. Thyroid Function Tests No results for input(s): TSH, T4TOTAL, T3FREE, THYROIDAB in the last 72 hours.  Invalid input(s): FREET3  Telemetry    AFib with RVR - Personally Reviewed  ECG    AFib, RVR, low voltage - Personally Reviewed  Radiology    Ct Chest W Contrast  Result Date: 12/15/2015 CLINICAL DATA:  Increased cough, shortness of breath, weakness and decreased appetite over the past few weeks. Loculated pleural effusion. EXAM: CT CHEST WITH CONTRAST TECHNIQUE: Multidetector CT imaging of  the chest was performed during intravenous contrast administration. CONTRAST:  63mL ISOVUE-300 IOPAMIDOL (ISOVUE-300) INJECTION 61% COMPARISON:  05/24/2015. FINDINGS: Cardiovascular: Atherosclerotic calcification of the arterial vasculature. Heart is mildly enlarged. Large pericardial effusion is new from 05/24/2015. Mediastinum/Nodes: Mediastinal lymph nodes are not enlarged by CT size criteria. No hilar or axillary adenopathy. Esophagus is grossly unremarkable. Tiny hiatal hernia. Lungs/Pleura: Mild volume loss in the medial aspects of the right middle lobe and lingula. Minimal dependent atelectasis bilaterally. Lungs are otherwise clear. No pleural fluid. Airway is unremarkable. Upper Abdomen: Visualized portions of the liver, adrenal glands, kidneys, spleen, pancreas, stomach and bowel are grossly unremarkable with exception of a tiny hiatal hernia. Upper abdominal lymph nodes are not enlarged by CT size criteria. Musculoskeletal: No worrisome lytic or sclerotic lesions. Degenerative changes are seen in the spine. IMPRESSION: 1. Large pericardial effusion, new from 05/24/2015. These results will be called to the ordering clinician or representative by the Radiologist Assistant, and communication documented in the PACS or zVision Dashboard. 2.  Aortic atherosclerosis (ICD10-170.0). Electronically Signed   By: Lorin Picket M.D.   On: 12/15/2015 16:08    Cardiac Studies   EF 60-65%  Patient Profile     75 y/o with known AFib, pericardial effusion  Assessment & Plan    1) Plan echo to evaluate significant of effusion.  SHOB has been chronic, not clear that it is related to effusion, but certainly could contribute.  2) AFib: beta blocker was held yesterday. She should receive it today.  This will hep with rate control.    Signed, Larae Grooms, MD  12/16/2015, 9:18 AM

## 2015-12-16 NOTE — Progress Notes (Addendum)
Patient Name: [name] Date of Encounter: 12/16/2015  Primary Cardiologist: [facility] Problem List     Principal Problem:   Pericardial effusion Active Problems:   Atrial fibrillation [facility])   Long term current use of anticoagulant therapy   Hypertension   Hyperlipidemia   Rheumatoid arthritis (HCC)   GERD (gastroesophageal reflux disease)     Subjective   No chest pain. Has had some upper back pain and chronic shortness of breath.  Inpatient Medications    Scheduled Meds: . acidophilus  1 capsule Oral Daily  . aspirin  325 mg Oral Daily  . cholecalciferol  2,000 Units Oral Daily  . colchicine  0.6 mg Oral BID  . folic acid  2 mg Oral Daily  . furosemide  40 mg Oral Daily  . hydrocortisone sod succinate (SOLU-CORTEF) inj  50 mg Intravenous Once  . metoprolol succinate  50 mg Oral BID  . multivitamin with minerals  1 tablet Oral Daily  . pantoprazole  40 mg Oral Daily  . pravastatin  20 mg Oral QPM  . predniSONE  2.5 mg Oral Q breakfast  . sodium chloride flush  3 mL Intravenous Q12H  . verapamil  40 mg Oral Q12H   Continuous Infusions: . heparin 1,000 Units/hr (12/16/15 0500)   PRN Meds: acetaminophen **OR** acetaminophen, albuterol, fluticasone, guaiFENesin, metoprolol, traMADol, zolpidem   Vital Signs    Vitals:   12/16/15 0045 12/16/15 0100 12/16/15 0205 12/16/15 0810  BP: 101/68 109/76 106/62 121/87  Pulse: 81 76 97 (!) 103  Resp: 21 22 18 15   Temp:   98.4 F (36.9 C) 97.9 F (36.6 C)  TempSrc:   Oral Oral  SpO2: 97% 99% 100% 100%  Weight:   148 lb 2.4 oz (67.2 kg)   Height:   5\' 3"  (1.6 m)     Intake/Output Summary (Last 24 hours) at 12/16/15 0918 Last data filed at 12/16/15 [id number]  Gross per 24 hour  Intake                0 ml  Output              450 ml  Net             -450 ml   Filed Weights   12/15/15 1759 12/16/15 0205  Weight: 144 lb (65.3 kg) 148 lb 2.4 oz (67.2 kg)    Physical Exam   GEN: Well nourished,  well developed, in no acute distress.  HEENT: Grossly normal.  Neck: Supple, no JVD, carotid bruits, or masses. Cardiac: irregularly irregular, no murmurs, rubs, or gallops. No clubbing, cyanosis, edema.  Radials/DP/PT 2+ and equal bilaterally.  Respiratory:  Respirations regular and unlabored, clear to auscultation bilaterally. GI: Soft, nontender, nondistended, BS + x 4. MS: no deformity or atrophy. Skin: warm and dry, no rash. Neuro:  Strength and sensation are intact. Psych: AAOx3.  Normal affect.  Labs    CBC  Recent Labs  12/15/15 1955 12/16/15 0250  WBC 5.8 4.6  HGB 10.6* 11.2*  HCT 32.4* 35.3*  MCV 93.6 93.9  PLT 301 [id number]   Basic Metabolic Panel  Recent Labs  12/15/15 1955 12/16/15 0250  NA 137 139  K 3.7 3.7  CL 107 108  CO2 24 25  GLUCOSE 93 101*  BUN 5* 5*  CREATININE 0.72 0.71  CALCIUM 8.6* 8.8*   Liver Function Tests  Recent Labs  12/15/15 1955  AST 26  ALT 17  ALKPHOS  62  BILITOT 0.4  PROT 5.8*  ALBUMIN 3.1*   No results for input(s): LIPASE, AMYLASE in the last 72 hours. Cardiac Enzymes No results for input(s): CKTOTAL, CKMB, CKMBINDEX, TROPONINI in the last 72 hours. BNP Invalid input(s): POCBNP D-Dimer No results for input(s): DDIMER in the last 72 hours. Hemoglobin A1C No results for input(s): HGBA1C in the last 72 hours. Fasting Lipid Panel No results for input(s): CHOL, HDL, LDLCALC, TRIG, CHOLHDL, LDLDIRECT in the last 72 hours. Thyroid Function Tests No results for input(s): TSH, T4TOTAL, T3FREE, THYROIDAB in the last 72 hours.  Invalid input(s): FREET3  Telemetry    AFib with RVR - Personally Reviewed  ECG    AFib, RVR, low voltage - Personally Reviewed  Radiology    Ct Chest W Contrast  Result Date: 12/15/2015 CLINICAL DATA:  Increased cough, shortness of breath, weakness and decreased appetite over the past few weeks. Loculated pleural effusion. EXAM: CT CHEST WITH CONTRAST TECHNIQUE: Multidetector CT imaging of  the chest was performed during intravenous contrast administration. CONTRAST:  84mL ISOVUE-300 IOPAMIDOL (ISOVUE-300) INJECTION 61% COMPARISON:  05/24/2015. FINDINGS: Cardiovascular: Atherosclerotic calcification of the arterial vasculature. Heart is mildly enlarged. Large pericardial effusion is new from 05/24/2015. Mediastinum/Nodes: Mediastinal lymph nodes are not enlarged by CT size criteria. No hilar or axillary adenopathy. Esophagus is grossly unremarkable. Tiny hiatal hernia. Lungs/Pleura: Mild volume loss in the medial aspects of the right middle lobe and lingula. Minimal dependent atelectasis bilaterally. Lungs are otherwise clear. No pleural fluid. Airway is unremarkable. Upper Abdomen: Visualized portions of the liver, adrenal glands, kidneys, spleen, pancreas, stomach and bowel are grossly unremarkable with exception of a tiny hiatal hernia. Upper abdominal lymph nodes are not enlarged by CT size criteria. Musculoskeletal: No worrisome lytic or sclerotic lesions. Degenerative changes are seen in the spine. IMPRESSION: 1. Large pericardial effusion, new from 05/24/2015. These results will be called to the ordering clinician or representative by the Radiologist Assistant, and communication documented in the PACS or zVision Dashboard. 2.  Aortic atherosclerosis (ICD10-170.0). Electronically Signed   By: Lorin Picket M.D.   On: 12/15/2015 16:08    Cardiac Studies   EF 60-65%  Patient Profile     75 y/o with known AFib, pericardial effusion  Assessment & Plan    1) Plan echo to evaluate significant of effusion.  SHOB has been chronic, not clear that it is related to effusion, but certainly could contribute.  2) AFib: beta blocker was held yesterday. She should receive it today.  This will hep with rate control.    Signed, Larae Grooms, MD  12/16/2015, 9:18 AM   Reviewed echo.  Spoke with Dr. Meda Coffee.  Early tamponade.  WIll plan on pericardial tap.  Jettie Booze, MD

## 2015-12-16 NOTE — Progress Notes (Signed)
Patient Name: [name] Date of Encounter: 12/16/2015  Primary Cardiologist: Dr. Marinda Elk Problem List     Principal Problem:   Pericardial effusion Active Problems:   Atrial fibrillation [facility])   Long term current use of anticoagulant therapy   Hypertension   Hyperlipidemia   Rheumatoid arthritis (HCC)   GERD (gastroesophageal reflux disease)     Subjective   No chest pain, back pain is slightly better this morning. Still short of breath with minimal activity.   Inpatient Medications    Scheduled Meds: . acidophilus  1 capsule Oral Daily  . aspirin  325 mg Oral Daily  . cholecalciferol  2,000 Units Oral Daily  . colchicine  0.6 mg Oral BID  . folic acid  2 mg Oral Daily  . furosemide  40 mg Oral Daily  . hydrocortisone sod succinate (SOLU-CORTEF) inj  50 mg Intravenous Once  . metoprolol succinate  50 mg Oral BID  . multivitamin with minerals  1 tablet Oral Daily  . pantoprazole  40 mg Oral Daily  . pravastatin  20 mg Oral QPM  . predniSONE  2.5 mg Oral Q breakfast  . sodium chloride flush  3 mL Intravenous Q12H  . verapamil  40 mg Oral Q12H   Continuous Infusions: . heparin 1,000 Units/hr (12/16/15 0500)   PRN Meds: acetaminophen **OR** acetaminophen, albuterol, fluticasone, guaiFENesin, metoprolol, traMADol, zolpidem   Vital Signs    Vitals:   12/16/15 0045 12/16/15 0100 12/16/15 0205 12/16/15 0810  BP: 101/68 109/76 106/62 121/87  Pulse: 81 76 97 (!) 103  Resp: 21 22 18 15   Temp:   98.4 F (36.9 C) 97.9 F (36.6 C)  TempSrc:   Oral Oral  SpO2: 97% 99% 100% 100%  Weight:   148 lb 2.4 oz (67.2 kg)   Height:   5\' 3"  (1.6 m)     Intake/Output Summary (Last 24 hours) at 12/16/15 0819 Last data filed at 12/16/15 [id number]  Gross per 24 hour  Intake                0 ml  Output              450 ml  Net             -450 ml   Filed Weights   12/15/15 1759 12/16/15 0205  Weight: 144 lb (65.3 kg) 148 lb 2.4 oz (67.2 kg)    Physical  Exam    GEN: Well nourished, well developed, in no acute distress.  HEENT: Grossly normal.  Neck: Supple, no JVD, carotid bruits, or masses. Cardiac: Irreg Irreg, no murmurs, rubs, or gallops. No clubbing, cyanosis, edema.  Radials/DP/PT 2+ and equal bilaterally.  Respiratory:  Respirations regular and unlabored, clear to auscultation bilaterally. GI: Soft, nontender, nondistended, BS + x 4. MS: no deformity or atrophy. Skin: warm and dry, no rash. Neuro:  Strength and sensation are intact. Psych: AAOx3.  Normal affect.  Labs    CBC  Recent Labs  12/15/15 1955 12/16/15 0250  WBC 5.8 4.6  HGB 10.6* 11.2*  HCT 32.4* 35.3*  MCV 93.6 93.9  PLT 301 A999333   Basic Metabolic Panel  Recent Labs  12/15/15 1955 12/16/15 0250  NA 137 139  K 3.7 3.7  CL 107 108  CO2 24 25  GLUCOSE 93 101*  BUN 5* 5*  CREATININE 0.72 0.71  CALCIUM 8.6* 8.8*   Liver Function Tests  Recent Labs  12/15/15 1955  AST 26  ALT 17  ALKPHOS 62  BILITOT 0.4  PROT 5.8*  ALBUMIN 3.1*   No results for input(s): LIPASE, AMYLASE in the last 72 hours. Cardiac Enzymes No results for input(s): CKTOTAL, CKMB, CKMBINDEX, TROPONINI in the last 72 hours. BNP Invalid input(s): POCBNP D-Dimer No results for input(s): DDIMER in the last 72 hours. Hemoglobin A1C No results for input(s): HGBA1C in the last 72 hours. Fasting Lipid Panel No results for input(s): CHOL, HDL, LDLCALC, TRIG, CHOLHDL, LDLDIRECT in the last 72 hours. Thyroid Function Tests No results for input(s): TSH, T4TOTAL, T3FREE, THYROIDAB in the last 72 hours.  Invalid input(s): FREET3  Telemetry    AF rates 120-130 - Personally Reviewed  ECG    11/29 AF rate 102 - Personally Reviewed  Radiology    Ct Chest W Contrast  Result Date: 12/15/2015 CLINICAL DATA:  Increased cough, shortness of breath, weakness and decreased appetite over the past few weeks. Loculated pleural effusion. EXAM: CT CHEST WITH CONTRAST TECHNIQUE:  Multidetector CT imaging of the chest was performed during intravenous contrast administration. CONTRAST:  70mL ISOVUE-300 IOPAMIDOL (ISOVUE-300) INJECTION 61% COMPARISON:  05/24/2015. FINDINGS: Cardiovascular: Atherosclerotic calcification of the arterial vasculature. Heart is mildly enlarged. Large pericardial effusion is new from 05/24/2015. Mediastinum/Nodes: Mediastinal lymph nodes are not enlarged by CT size criteria. No hilar or axillary adenopathy. Esophagus is grossly unremarkable. Tiny hiatal hernia. Lungs/Pleura: Mild volume loss in the medial aspects of the right middle lobe and lingula. Minimal dependent atelectasis bilaterally. Lungs are otherwise clear. No pleural fluid. Airway is unremarkable. Upper Abdomen: Visualized portions of the liver, adrenal glands, kidneys, spleen, pancreas, stomach and bowel are grossly unremarkable with exception of a tiny hiatal hernia. Upper abdominal lymph nodes are not enlarged by CT size criteria. Musculoskeletal: No worrisome lytic or sclerotic lesions. Degenerative changes are seen in the spine. IMPRESSION: 1. Large pericardial effusion, new from 05/24/2015. These results will be called to the ordering clinician or representative by the Radiologist Assistant, and communication documented in the PACS or zVision Dashboard. 2.  Aortic atherosclerosis (ICD10-170.0). Electronically Signed   By: Lorin Picket M.D.   On: 12/15/2015 16:08    Cardiac Studies   None  Patient Profile     75 yo female with PMH of persistent AF, HTN, HLD, RA who presented to the ED with finding of moderate circumferential pericardial effusion on a CT scan after having increasing dyspnea over the past 3 weeks.   Assessment & Plan    1. Pericardial Effusion: Noted on CT scan that prompted admission from the ED. Last echo from 10/01/15 with no evidence of pericardial effusion noted. Recently on antibiotics, no RA flares. No chest pain this morning, back pain is somewhat better.  --  2D echo pending this morning, further management based on echo results.   2. Persistent AF: Rates slightly elevated this morning. Coumadin was held and started.  -- Did not receive her Toprol last night due to hypotension. Will continue with same dose, monitor HR. If rates remain elevated, would further increase.   3. HTN: Controlled   Signed, Reino Bellis, NP  12/16/2015, 8:19 AM   I have examined the patient and reviewed assessment and plan and discussed with patient.  Agree with above as stated.  Has been off Enbrel for a few weeks due to viral illness.  Larae Grooms

## 2015-12-16 NOTE — Brief Op Note (Signed)
Brief Cardiac Catheterization Note (Full Note to Follow)  Date: 12/16/2015 Time: 6:37 PM  PATIENT:  [name]  75 y.o. female  PRE-OPERATIVE DIAGNOSIS:  pericardial effusion  POST-OPERATIVE DIAGNOSIS:  Same  PROCEDURE:  Procedure(s): Pericardiocentesis (N/A)  SURGEON:  Surgeon(s) and Role:    * Nelva Bush, MD - Primary  Findings: 1.  Large pericardial effusion by ultrasound. 2.  Successful ultrasound and fluoroscopic-guided pericardiocentesis and pericardial drain placement with removal of 500 mL of dark sanguinous fluid. 3.  Opening pressure 10 mmHg, closing pressure 5 mmHg.  Recommendations: 1.  Transfer to cardiac ICU. 2.  Maintain drain to Doval suction device and record output every 4 hours. 3.  Repeat limited echo tomorrow to reevaluate effusion. 4.  Continue colchicine 0.6 mg BID.  Vicodin 5/325 every 4 hours as needed for pain associated with drain catheter. 5.  Hold all anticoagulation.  Nelva Bush, MD [facility] Pager: [phone]

## 2015-12-16 NOTE — Progress Notes (Signed)
[place] for Heparin Indication: atrial fibrillation  Allergies  Allergen Reactions  . Arthrotec [Diclofenac-Misoprostol] Diarrhea  . Erythromycin Other (See Comments)    Anaphylaxis   . Morphine Sulfate Other (See Comments)    Erratic behavior; hallucinations  . Diltiazem Hcl Swelling and Rash    Able to tolerate Verapamil  . Gold-Containing Drug Products Rash  . Macrodantin [Nitrofurantoin Macrocrystal] Rash  . Naproxen Rash  . Penicillins Rash    Amoxicillin Has patient had a PCN reaction causing immediate rash, facial/tongue/throat swelling, SOB or lightheadedness with hypotension: Yes Has patient had a PCN reaction causing severe rash involving mucus membranes or skin necrosis: Yes Has patient had a PCN reaction that required hospitalization No Has patient had a PCN reaction occurring within the last 10 years: No If all of the above answers are "NO", then may proceed with Cephalosporin use.     Patient Measurements: Height: 5\' 3"  (160 cm) Weight: 148 lb 2.4 oz (67.2 kg) IBW/kg (Calculated) : 52.4 Heparin Dosing Weight: 67 kg  Vital Signs: Temp: 98.2 F (36.8 C) (11/30 1249) Temp Source: Oral (11/30 1249) BP: 109/44 (11/30 1249) Pulse Rate: 109 (11/30 1249)  Labs:  Recent Labs  12/13/15 1704 12/15/15 1955 12/16/15 0250 12/16/15 1258  HGB  --  10.6* 11.2*  --   HCT  --  32.4* 35.3*  --   PLT  --  301 289  --   LABPROT  --  20.8*  --   --   INR  --  1.76  --   --   HEPARINUNFRC  --   --   --  0.28*  CREATININE 0.77 0.72 0.71  --     Estimated Creatinine Clearance: 56.8 mL/min (by C-G formula based on SCr of 0.71 mg/dL).    Assessment: 75 y.o. F presents with pericardial effusion. Pt on coumadin PTA. INR 1.76 (subtherapeutic) on admission. Holding coumadin and bridging with heparin in case pericardiocentesis needed.   Heparin level this PM = 0.28  Home coumadin dose: 5mg  daily except for 2.5mg  on Thur - last  dose 11/28  Goal of Therapy:  Heparin level 0.3-0.7 units/ml Monitor platelets by anticoagulation protocol: Yes   Plan:  Heparin gtt to to 1150 units/hr.  Daily heparin level and CBC  Thank you Anette Guarneri, PharmD [phone]  12/16/2015,2:01 PM

## 2015-12-16 NOTE — Progress Notes (Signed)
  Echocardiogram 2D Echocardiogram has been performed.  [name] 12/16/2015, 6:45 PM

## 2015-12-16 NOTE — Interval H&P Note (Signed)
History and Physical Interval Note:  12/16/2015 5:51 PM  [name]  has presented today for pericardiocentesis, with the diagnosis of pericardial effusion. The various methods of treatment have been discussed with the patient and family. After consideration of risks, benefits and other options for treatment, the patient has consented to  Procedure(s): Pericardiocentesis (N/A) as a surgical intervention .  The patient's history has been reviewed, patient examined, no change in status, stable for surgery.  I have reviewed the patient's chart and labs.  Questions were answered to the patient's satisfaction.     [name]

## 2015-12-16 NOTE — Progress Notes (Signed)
  Echocardiogram 2D Echocardiogram has been performed.  [name] M 12/16/2015, 12:33 PM

## 2015-12-16 NOTE — ED Notes (Signed)
Pharmacy request to adjust medication times per patient

## 2015-12-16 NOTE — Progress Notes (Signed)
Initial Nutrition Assessment   INTERVENTION:   Once diet advances will add carnation instant breakfast to breakfast and dinner meal trays.  Reviewed sources of high protein foods, good on-the-go options discussed. Pt willing to consider carnation instant breakfast, homemade protein shake or boost/ensure at home as needed to prevent her from skipping meals.    NUTRITION DIAGNOSIS:   Inadequate oral intake related to  (decreased appetite with recurring PNA) as evidenced by per patient/family report, 7.5 percent weight loss x 7 months.  GOAL:   Patient will meet greater than or equal to 90% of their needs  MONITOR:   PO intake, Supplement acceptance, I & O's  REASON FOR ASSESSMENT:   Malnutrition Screening Tool    ASSESSMENT:   Pt with PMH of HTN, RA, GERD, hyperlipidemia, pulmonary HTN who recently was treated for PNA admitted with pericardial effusion.     Pt reports that since April 2017 she has lost weight becase she has had PNA x 3 and a recent GI illness. 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 She has lost 12 lb or 7.5% over 7 months. She tends to eat "on the run" according to her since she cares for her husband. Breakfast: bagel with cream cheese, sometimes just toast, Lunch: sandwich but sometimes does not eat, Dinner: home cooked meal. She serves her husband in the den in his chair. Pt sometimes eats as she is doing other tasks.  Pt has been drinking Ovaltine thinking this had protein in it. Pt reports decreased appetite after so many illnesses.   Medications reviewed and include: folvite, lasix, solu-cortef, prednisone Labs reviewed Nutrition-Focused physical exam completed. Findings are no fat depletion, mild/moderate muscle depletion at temples and calves, and no edema.     Diet Order:  Diet NPO time specified Except for: Sips with Meds, Ice Chips  Skin:  Reviewed, no issues  Last BM:  unknown  Height:    Ht Readings from Last 1 Encounters:  12/16/15 5\' 3"  (1.6 m)    Weight:   Wt Readings from Last 1 Encounters:  12/16/15 148 lb 2.4 oz (67.2 kg)    Ideal Body Weight:  52.2 kg  BMI:  Body mass index is 26.24 kg/m.  Estimated Nutritional Needs:   Kcal:  1500-1700  Protein:  75-85 grams  Fluid:  > 1.5 L/day  EDUCATION NEEDS:   Education needs addressed  Maylon Peppers RD, Lupus, [place] Pager [phone] After Hours Pager

## 2015-12-17 ENCOUNTER — Inpatient Hospital Stay (HOSPITAL_COMMUNITY): Payer: Medicare Other

## 2015-12-17 ENCOUNTER — Encounter (HOSPITAL_COMMUNITY): Payer: Self-pay | Admitting: Internal Medicine

## 2015-12-17 DIAGNOSIS — Z7901 Long term (current) use of anticoagulants: Secondary | ICD-10-CM

## 2015-12-17 DIAGNOSIS — I319 Disease of pericardium, unspecified: Secondary | ICD-10-CM

## 2015-12-17 DIAGNOSIS — I313 Pericardial effusion (noninflammatory): Principal | ICD-10-CM

## 2015-12-17 DIAGNOSIS — I482 Chronic atrial fibrillation: Secondary | ICD-10-CM

## 2015-12-17 LAB — BASIC METABOLIC PANEL
ANION GAP: 7 (ref 5–15)
BUN: <5 mg/dL — ABNORMAL LOW (ref 6–20)
CALCIUM: 8.5 mg/dL — AB (ref 8.9–10.3)
CO2: 28 mmol/L (ref 22–32)
Chloride: 106 mmol/L (ref 101–111)
Creatinine, Ser: 0.74 mg/dL (ref 0.44–1.00)
Glucose, Bld: 82 mg/dL (ref 65–99)
POTASSIUM: 3.8 mmol/L (ref 3.5–5.1)
Sodium: 141 mmol/L (ref 135–145)

## 2015-12-17 NOTE — Progress Notes (Signed)
PROGRESS NOTE  [name] [id number] DOB: 1940/05/26 DOA: 12/15/2015 PCP: Leeroy Cha, MD   LOS: 2 days   Brief Narrative: [name] is a 75 y.o. female with medical history significant of Hypertension, hyperlipidemia, GERD, rheumatoid arthritis, pulmonary hypertension, atrial fibrillation on Coumadin, recently treated for pneumonia, who presents with shortness of breath and upper back pain, seen in Pulmonary clinic, had a CT scan of the chest which showed significant pericardial effusion and thus directed for admission.  Assessment & Plan: Principal Problem:   Pericardial effusion Active Problems:   Atrial fibrillation (HCC)   Long term current use of anticoagulant therapy   Hypertension   Hyperlipidemia   Rheumatoid arthritis (HCC)   GERD (gastroesophageal reflux disease)  Pericardial effusion  - 2d echo done on 11/30 with depressed EF 45-50% and moderate to severe circumferential effusion, patient was taken to the cath lab 11/30 pm for pericardiocentesis and pericardial drain placement with removal of 500 mL of dark sanguinous fluid, transferred to ICU afterwards. Fluid with inflammatory characteristics - hold coumadin - drain removal per cardiology  - continue colchicine   Atrial Fibrillation  - CHA2DS2-VASc Score is 4, Coumadin on hold for now - Continue metoprolol and verapamil  Rheumatoid arthritis  - On prednisone 2.5 mg daily, methotrexate and Etenercept. Clinically stable. Liver function normal - Continue methotrexate and prednisone - Hold Etenercept  HLD - Last LDL was 79 on 05/21/13  - continue home statin  GERD - Protonix  HTN - continue metoprolol, Verapamil  Shortness of breath - Likely due to multifactorial, including pericardial effusion, recent pneumonia. No signs of infection. - Albuterol nebs when necessary - improved   DVT prophylaxis: SCD Code Status: Full Family Communication: no family bedside Disposition  Plan: home when ready  Consultants:   Cardiology  Procedures:   2D echo  Pericardiocentesis   Antimicrobials:  None    Subjective: - complains of chest discomfort, mild dyspnea  Objective: Vitals:   12/17/15 1000 12/17/15 1100 12/17/15 1123 12/17/15 1200  BP: (!) 79/61 (!) 91/56  (!) 83/53  Pulse: 73 81  87  Resp: (!) 25 20  (!) 22  Temp:   97.5 F (36.4 C)   TempSrc:   Oral   SpO2: 90% 95%  95%  Weight:      Height:        Intake/Output Summary (Last 24 hours) at 12/17/15 1307 Last data filed at 12/17/15 1000  Gross per 24 hour  Intake              793 ml  Output             1060 ml  Net             -267 ml   Filed Weights   12/15/15 1759 12/16/15 0205  Weight: 65.3 kg (144 lb) 67.2 kg (148 lb 2.4 oz)    Examination: Constitutional: NAD Vitals:   12/17/15 1000 12/17/15 1100 12/17/15 1123 12/17/15 1200  BP: (!) 79/61 (!) 91/56  (!) 83/53  Pulse: 73 81  87  Resp: (!) 25 20  (!) 22  Temp:   97.5 F (36.4 C)   TempSrc:   Oral   SpO2: 90% 95%  95%  Weight:      Height:       Eyes: PERRL, lids and conjunctivae normal Respiratory: clear to auscultation bilaterally, no wheezing, no crackles.  Cardiovascular: irregular, no murmurs / rubs / gallops. Drain in place Abdomen: no tenderness.  Bowel sounds positive.  Musculoskeletal: no clubbing / cyanosis.  Skin: no rashes, lesions, ulcers. No induration Neurologic: non focal  Psychiatric: Normal judgment and insight. Alert and oriented x 3. Normal mood.    Data Reviewed: I have personally reviewed following labs and imaging studies  CBC:  Recent Labs Lab 12/15/15 1955 12/16/15 0250  WBC 5.8 4.6  HGB 10.6* 11.2*  HCT 32.4* 35.3*  MCV 93.6 93.9  PLT 301 [id number]   Basic Metabolic Panel:  Recent Labs Lab 12/13/15 1704 12/15/15 1955 12/16/15 0250 12/16/15 1934 12/17/15 0334  NA 140 137 139  --  141  K 3.6 3.7 3.7  --  3.8  CL 103 107 108  --  106  CO2 30 24 25   --  28  GLUCOSE 99 93 101* 84 82   BUN 9 5* 5*  --  <5*  CREATININE 0.77 0.72 0.71  --  0.74  CALCIUM 9.1 8.6* 8.8*  --  8.5*   GFR: Estimated Creatinine Clearance: 56.8 mL/min (by C-G formula based on SCr of 0.74 mg/dL). Liver Function Tests:  Recent Labs Lab 12/15/15 1955  AST 26  ALT 17  ALKPHOS 62  BILITOT 0.4  PROT 5.8*  ALBUMIN 3.1*   No results for input(s): LIPASE, AMYLASE in the last 168 hours. No results for input(s): AMMONIA in the last 168 hours. Coagulation Profile:  Recent Labs Lab 12/15/15 1955  INR 1.76   Cardiac Enzymes: No results for input(s): CKTOTAL, CKMB, CKMBINDEX, TROPONINI in the last 168 hours. BNP (last 3 results)  Recent Labs  12/13/15 1704  PROBNP 215.0*   HbA1C: No results for input(s): HGBA1C in the last 72 hours. CBG: No results for input(s): GLUCAP in the last 168 hours. Lipid Profile: No results for input(s): CHOL, HDL, LDLCALC, TRIG, CHOLHDL, LDLDIRECT in the last 72 hours. Thyroid Function Tests: No results for input(s): TSH, T4TOTAL, FREET4, T3FREE, THYROIDAB in the last 72 hours. Anemia Panel: No results for input(s): VITAMINB12, FOLATE, FERRITIN, TIBC, IRON, RETICCTPCT in the last 72 hours. Urine analysis:    Component Value Date/Time   COLORURINE AMBER (A) 05/23/2015 1941   APPEARANCEUR CLEAR 05/23/2015 1941   LABSPEC 1.021 05/23/2015 1941   PHURINE 6.5 05/23/2015 1941   GLUCOSEU NEGATIVE 05/23/2015 1941   HGBUR MODERATE (A) 05/23/2015 1941   BILIRUBINUR NEGATIVE 05/23/2015 [place] NEGATIVE 05/23/2015 1941   PROTEINUR 30 (A) 05/23/2015 1941   UROBILINOGEN 0.2 10/29/2013 1932   NITRITE NEGATIVE 05/23/2015 1941   LEUKOCYTESUR NEGATIVE 05/23/2015 1941   Sepsis Labs: Invalid input(s): PROCALCITONIN, LACTICIDVEN  Recent Results (from the past 240 hour(s))  Culture, body fluid-bottle     Status: None (Preliminary result)   Collection Time: 12/16/15  6:13 PM  Result Value Ref Range Status   Specimen Description FLUID PERICARDIAL  Final    Special Requests BOTTLES DRAWN AEROBIC AND ANAEROBIC 10CC  Final   Culture NO GROWTH < 24 HOURS  Final   Report Status PENDING  Incomplete  Gram stain     Status: None   Collection Time: 12/16/15  6:13 PM  Result Value Ref Range Status   Specimen Description FLUID PERICARDIAL  Final   Special Requests NONE  Final   Gram Stain   Final    ABUNDANT WBC PRESENT, PREDOMINANTLY MONONUCLEAR NO ORGANISMS SEEN    Report Status 12/16/2015 FINAL  Final  MRSA PCR Screening     Status: Abnormal   Collection Time: 12/16/15  8:16 PM  Result Value Ref  Range Status   MRSA by PCR POSITIVE (A) NEGATIVE Final    Comment:        The GeneXpert MRSA Assay (FDA approved for NASAL specimens only), is one component of a comprehensive MRSA colonization surveillance program. It is not intended to diagnose MRSA infection nor to guide or monitor treatment for MRSA infections. RESULT CALLED TO, READ BACK BY AND VERIFIED WITH: ANNA NINO,RN @2317  12/16/15 MKELLY,MLT       Radiology Studies: Dg Chest Port 1 View  Result Date: 12/16/2015 CLINICAL DATA:  Pericardiocentesis for pericardial effusion. EXAM: PORTABLE CHEST 1 VIEW COMPARISON:  CT from 12/15/2015 FINDINGS: There is a catheter noted along the base of the heart with the tip in head the right cardiophrenic angle and base of heart. Mild interstitial prominence is noted without pneumothorax. The cardiac silhouette is enlarged. There is mild aortic atherosclerosis. No pneumonic consolidation or suspicious osseous lesions. IMPRESSION: Catheter tip is seen along the base the heart to the right of midline. Enlarged cardiac silhouette is noted. There is aortic atherosclerosis. No pneumothorax is identified. Electronically Signed   By: [name] Royalty M.D.   On: 12/16/2015 19:24     Scheduled Meds: . acidophilus  1 capsule Oral Daily  . Chlorhexidine Gluconate Cloth  6 each Topical Q0600  . cholecalciferol  2,000 Units Oral Daily  . colchicine  0.6 mg Oral BID    . folic acid  2 mg Oral Daily  . hydrocortisone sod succinate (SOLU-CORTEF) inj  50 mg Intravenous Once  . metoprolol succinate  75 mg Oral BID  . multivitamin with minerals  1 tablet Oral Daily  . mupirocin ointment  1 application Nasal BID  . pantoprazole  40 mg Oral Daily  . pravastatin  20 mg Oral QPM  . predniSONE  2.5 mg Oral Q breakfast  . sodium chloride flush  3 mL Intravenous Q12H  . verapamil  40 mg Oral Q12H   Continuous Infusions:   Marzetta Board, MD, PhD [facility] Pager [phone]  If 7PM-7AM, please contact night-coverage [email or web address] Password [email or web address] 12/17/2015, 1:07 PM

## 2015-12-17 NOTE — Progress Notes (Signed)
   Minimal drainage from the pericardial drain.  I reviewed the limited echo.  There is a small amount of fluid posterior to the heart.  Will plan on pulling pericardial drain later today.  She will need a f/u limited echo in a few weeks.  No anticoagulation until that time.    Jettie Booze, MD

## 2015-12-17 NOTE — Progress Notes (Signed)
  Echocardiogram 2D Echocardiogram has been performed.  Johny Chess 12/17/2015, 5:25 PM

## 2015-12-17 NOTE — Progress Notes (Addendum)
Patient Name: [name] Date of Encounter: 12/17/2015  Primary Cardiologist: [facility] Problem List     Principal Problem:   Pericardial effusion Active Problems:   Atrial fibrillation ([place])   Long term current use of anticoagulant therapy   Hypertension   Hyperlipidemia   Rheumatoid arthritis (HCC)   GERD (gastroesophageal reflux disease)     Subjective   Breathing better  Inpatient Medications    Scheduled Meds: . acidophilus  1 capsule Oral Daily  . Chlorhexidine Gluconate Cloth  6 each Topical Q0600  . cholecalciferol  2,000 Units Oral Daily  . colchicine  0.6 mg Oral BID  . folic acid  2 mg Oral Daily  . hydrocortisone sod succinate (SOLU-CORTEF) inj  50 mg Intravenous Once  . metoprolol succinate  75 mg Oral BID  . multivitamin with minerals  1 tablet Oral Daily  . mupirocin ointment  1 application Nasal BID  . pantoprazole  40 mg Oral Daily  . pravastatin  20 mg Oral QPM  . predniSONE  2.5 mg Oral Q breakfast  . sodium chloride flush  3 mL Intravenous Q12H  . verapamil  40 mg Oral Q12H   Continuous Infusions:  PRN Meds: acetaminophen **OR** acetaminophen, albuterol, fluticasone, guaiFENesin, HYDROcodone-acetaminophen, metoprolol, traMADol, zolpidem   Vital Signs    Vitals:   12/17/15 0530 12/17/15 0600 12/17/15 0630 12/17/15 0745  BP: (!) 86/66 95/67 100/84   Pulse: (!) 102 (!) 109 (!) 106   Resp: (!) 23 20 (!) 28   Temp:    97.3 F (36.3 C)  TempSrc:    Oral  SpO2: 95% 95% 93%   Weight:      Height:        Intake/Output Summary (Last 24 hours) at 12/17/15 0916 Last data filed at 12/17/15 0600  Gross per 24 hour  Intake              603 ml  Output              660 ml  Net              -57 ml   Filed Weights   12/15/15 1759 12/16/15 0205  Weight: 144 lb (65.3 kg) 148 lb 2.4 oz (67.2 kg)    Physical Exam    GEN: Well nourished, well developed, in no acute distress.  HEENT: Grossly normal.  Neck: Supple, no JVD,  carotid bruits, or masses. Cardiac: irregularly irregular, drain in place  Respiratory: no wheezing bilaterally. GI: Soft, nontender, nondistended, BS + x 4. MS: no deformity or atrophy. Skin: warm and dry, no rash. Neuro:  Strength and sensation are intact. Psych: AAOx3.  Normal affect.  Labs    CBC  Recent Labs  12/15/15 1955 12/16/15 0250  WBC 5.8 4.6  HGB 10.6* 11.2*  HCT 32.4* 35.3*  MCV 93.6 93.9  PLT 301 [id number]   Basic Metabolic Panel  Recent Labs  12/16/15 0250 12/16/15 1934 12/17/15 0334  NA 139  --  141  K 3.7  --  3.8  CL 108  --  106  CO2 25  --  28  GLUCOSE 101* 84 82  BUN 5*  --  <5*  CREATININE 0.71  --  0.74  CALCIUM 8.8*  --  8.5*   Liver Function Tests  Recent Labs  12/15/15 1955  AST 26  ALT 17  ALKPHOS 62  BILITOT 0.4  PROT 5.8*  ALBUMIN 3.1*   No results for  input(s): LIPASE, AMYLASE in the last 72 hours. Cardiac Enzymes No results for input(s): CKTOTAL, CKMB, CKMBINDEX, TROPONINI in the last 72 hours. BNP Invalid input(s): POCBNP D-Dimer No results for input(s): DDIMER in the last 72 hours. Hemoglobin A1C No results for input(s): HGBA1C in the last 72 hours. Fasting Lipid Panel No results for input(s): CHOL, HDL, LDLCALC, TRIG, CHOLHDL, LDLDIRECT in the last 72 hours. Thyroid Function Tests No results for input(s): TSH, T4TOTAL, T3FREE, THYROIDAB in the last 72 hours.  Invalid input(s): FREET3  Telemetry    AFib - Personally Reviewed  ECG     AFib- Personally Reviewed  Radiology    Ct Chest W Contrast  Result Date: 12/15/2015 CLINICAL DATA:  Increased cough, shortness of breath, weakness and decreased appetite over the past few weeks. Loculated pleural effusion. EXAM: CT CHEST WITH CONTRAST TECHNIQUE: Multidetector CT imaging of the chest was performed during intravenous contrast administration. CONTRAST:  77mL ISOVUE-300 IOPAMIDOL (ISOVUE-300) INJECTION 61% COMPARISON:  05/24/2015. FINDINGS: Cardiovascular:  Atherosclerotic calcification of the arterial vasculature. Heart is mildly enlarged. Large pericardial effusion is new from 05/24/2015. Mediastinum/Nodes: Mediastinal lymph nodes are not enlarged by CT size criteria. No hilar or axillary adenopathy. Esophagus is grossly unremarkable. Tiny hiatal hernia. Lungs/Pleura: Mild volume loss in the medial aspects of the right middle lobe and lingula. Minimal dependent atelectasis bilaterally. Lungs are otherwise clear. No pleural fluid. Airway is unremarkable. Upper Abdomen: Visualized portions of the liver, adrenal glands, kidneys, spleen, pancreas, stomach and bowel are grossly unremarkable with exception of a tiny hiatal hernia. Upper abdominal lymph nodes are not enlarged by CT size criteria. Musculoskeletal: No worrisome lytic or sclerotic lesions. Degenerative changes are seen in the spine. IMPRESSION: 1. Large pericardial effusion, new from 05/24/2015. These results will be called to the ordering clinician or representative by the Radiologist Assistant, and communication documented in the PACS or zVision Dashboard. 2.  Aortic atherosclerosis (ICD10-170.0). Electronically Signed   By: Lorin Picket M.D.   On: 12/15/2015 16:08   Dg Chest Port 1 View  Result Date: 12/16/2015 CLINICAL DATA:  Pericardiocentesis for pericardial effusion. EXAM: PORTABLE CHEST 1 VIEW COMPARISON:  CT from 12/15/2015 FINDINGS: There is a catheter noted along the base of the heart with the tip in head the right cardiophrenic angle and base of heart. Mild interstitial prominence is noted without pneumothorax. The cardiac silhouette is enlarged. There is mild aortic atherosclerosis. No pneumonic consolidation or suspicious osseous lesions. IMPRESSION: Catheter tip is seen along the base the heart to the right of midline. Enlarged cardiac silhouette is noted. There is aortic atherosclerosis. No pneumothorax is identified. Electronically Signed   By: [name] Royalty M.D.   On: 12/16/2015 19:24      Cardiac Studies     Patient Profile     75 y/o with pericardial effusion, bloody  Assessment & Plan    1) Continue colchicine.  She takes low dose prednisone.  Rash with naprosyn in the past.  Await echo to look for residual effusion.  Still had 160 cc drainage yesterday.  Would like to see that number decrease or see that the effusion has resolved by echo before removing the drain.   AFib: Continue beta blocker for rate control.    Given that the effusion was so bloody, would plan on holding warfarin for at least 2-4 weeks.  Will then check limited echo and consider resuming anticoagulation.  Await cytology.  Current results show Effusion appears inflammatory.  Not transudative from heart failure.  OK to  move to stepdown.  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  She wants to go home ASAP but I explained that we don't want the effusion reaccumulating.   Signed, Larae Grooms, MD  12/17/2015, 9:16 AM

## 2015-12-17 NOTE — Progress Notes (Signed)
Reviewed Echo results with Dr. Irish Lack. Minimal post basal effusion. No significant drainage today from drain. Pericardial drain removed without difficulty.   Peter Martinique MD, [facility]  12/17/2015 6:08 PM

## 2015-12-18 ENCOUNTER — Inpatient Hospital Stay (HOSPITAL_COMMUNITY): Payer: Medicare Other

## 2015-12-18 DIAGNOSIS — I319 Disease of pericardium, unspecified: Secondary | ICD-10-CM

## 2015-12-18 LAB — ECHOCARDIOGRAM LIMITED
FS: 16 % — AB (ref 28–44)
HEIGHTINCHES: 63 in
HEIGHTINCHES: 63 in
IVS/LV PW RATIO, ED: 1.01
LA ID, A-P, ES: 49 mm
LADIAMINDEX: 2.88 cm/m2
LDCA: 2.84 cm2
LEFT ATRIUM END SYS DIAM: 49 mm
LV PW d: 11 mm — AB (ref 0.6–1.1)
LVOTD: 19 mm
WEIGHTICAEL: 2370.39 [oz_av]
Weight: 2370.39 oz

## 2015-12-18 LAB — BASIC METABOLIC PANEL
Anion gap: 9 (ref 5–15)
BUN: 9 mg/dL (ref 6–20)
CO2: 25 mmol/L (ref 22–32)
CREATININE: 0.8 mg/dL (ref 0.44–1.00)
Calcium: 8.7 mg/dL — ABNORMAL LOW (ref 8.9–10.3)
Chloride: 108 mmol/L (ref 101–111)
GFR calc Af Amer: >60 mL/min (ref >60)
GLUCOSE: 102 mg/dL — AB (ref 65–99)
POTASSIUM: 3.9 mmol/L (ref 3.5–5.1)
Sodium: 142 mmol/L (ref 135–145)

## 2015-12-18 LAB — CBC
HCT: 38.8 % (ref 36.0–46.0)
Hemoglobin: 12.2 g/dL (ref 12.0–15.0)
MCH: 29.5 pg (ref 26.0–34.0)
MCHC: 31.4 g/dL (ref 30.0–36.0)
MCV: 93.7 fL (ref 78.0–100.0)
PLATELETS: 304 10*3/uL (ref 150–400)
RBC: 4.14 MIL/uL (ref 3.87–5.11)
RDW: 15.3 % (ref 11.5–15.5)
WBC: 6.7 10*3/uL (ref 4.0–10.5)

## 2015-12-18 LAB — GLUCOSE, CAPILLARY
Glucose-Capillary: 89 mg/dL (ref 65–99)
Glucose-Capillary: 112 mg/dL — ABNORMAL HIGH (ref 65–99)

## 2015-12-18 NOTE — Progress Notes (Signed)
  Echocardiogram 2D Echocardiogram limited has been performed.  [name] 12/18/2015, 2:43 PM

## 2015-12-18 NOTE — Progress Notes (Signed)
Spoke with Dr.Taylor who reviewed the note.  Patient can return home and be followed by Dr. Irish Lack. Appointment will be made. Follow up echo as requested by Dr. Irish Lack.

## 2015-12-18 NOTE — Progress Notes (Signed)
PROGRESS NOTE    [name]  [id number] DOB: [birth year] DOA: 12/15/2015 PCP: Leeroy Cha, MD   Brief Narrative: Mckinzi Cohoon Sheltonis a 75 y.o.femalewith medical history significant of Hypertension, hyperlipidemia, GERD, rheumatoid arthritis, pulmonary hypertension, atrial fibrillation on Coumadin, recently treated for pneumonia, who presents with shortness of breath and upper back pain, seen in Pulmonary clinic, had a CT scan of the chest which showed significant pericardial effusion and thus directed for admission  Assessment & Plan:   # Pericardial effusion: The pericardial drain was removed yesterday. Patient was in Coumadin for atrial fibrillation. She developed hemopericardium likely in the setting of systemic anticoagulation. Plan to hold anticoagulation for 2-4 weeks as per cardiologist. Plan for repeat echo tomorrow morning for follow-up. Continue prednisone and colchicine. Cardiology consult appreciated.  #Persistent atrial fibrillation: Continue to control with metoprolol and verapamil. Coumadin on hold. Follow-up with cardiologist.  #History of rheumatoid arthritis: Continue current medication. Advised outpatient follow-up.  #Hypertension: Monitor blood pressure. On metoprolol and verapamil.   Principal Problem:   Pericardial effusion Active Problems:   Atrial fibrillation (HCC)   Long term current use of anticoagulant therapy   Hypertension   Hyperlipidemia   Rheumatoid arthritis (HCC)   GERD (gastroesophageal reflux disease)  DVT prophylaxis: SCD and artery embolism. No anticoagulation because of hemopericardium Code Status: Full code Family Communication: No family present at bedside Disposition Plan: Likely discharge home tomorrow after echocardiogram  Consultants:   Cardiology  Procedures: Pericardiocentesis and echocardiogram Antimicrobials: None  Subjective: Patient was seen and examined at bedside. Patient reported mild shortness of  breath with ambulation. Denied fever, chills. Feels chest discomfort around the site of pericardiocentesis. No nausea vomiting or abdominal pain. No dizziness or headache   Objective: Vitals:   12/18/15 1100 12/18/15 1119 12/18/15 1200 12/18/15 1300  BP: (!) 89/63 (!) 89/63 (!) 101/56 (!) 113/97  Pulse:  69    Resp: 19 18 (!) 26 (!) 21  Temp:  97.7 F (36.5 C)    TempSrc:  Oral    SpO2:  95%    Weight:      Height:        Intake/Output Summary (Last 24 hours) at 12/18/15 1419 Last data filed at 12/18/15 1404  Gross per 24 hour  Intake              300 ml  Output              350 ml  Net              -50 ml   Filed Weights   12/15/15 1759 12/16/15 0205  Weight: 65.3 kg (144 lb) 67.2 kg (148 lb 2.4 oz)    Examination:  General exam: Appears calm and comfortable  Respiratory system: Clear to auscultation. Respiratory effort normal. No wheezing or crackle Cardiovascular system: S1 & S2 heard, RRR.  No pedal edema. Gastrointestinal system: Abdomen is nondistended, soft and nontender. Normal bowel sounds heard. Central nervous system: Alert and oriented. No focal neurological deficits. Extremities: Symmetric 5 x 5 power. Skin: No rashes, lesions or ulcers Psychiatry: Judgement and insight appear normal. Mood & affect appropriate.     Data Reviewed: I have personally reviewed following labs and imaging studies  CBC:  Recent Labs Lab 12/15/15 1955 12/16/15 0250 12/18/15 0323  WBC 5.8 4.6 6.7  HGB 10.6* 11.2* 12.2  HCT 32.4* 35.3* 38.8  MCV 93.6 93.9 93.7  PLT 301 289 123456   Basic Metabolic Panel:  Recent Labs  Lab 12/13/15 1704 12/15/15 1955 12/16/15 0250 12/16/15 1934 12/17/15 0334 12/18/15 0323  NA 140 137 139  --  141 142  K 3.6 3.7 3.7  --  3.8 3.9  CL 103 107 108  --  106 108  CO2 30 24 25   --  28 25  GLUCOSE 99 93 101* 84 82 102*  BUN 9 5* 5*  --  <5* 9  CREATININE 0.77 0.72 0.71  --  0.74 0.80  CALCIUM 9.1 8.6* 8.8*  --  8.5* 8.7*    GFR: Estimated Creatinine Clearance: 56.8 mL/min (by C-G formula based on SCr of 0.8 mg/dL). Liver Function Tests:  Recent Labs Lab 12/15/15 1955  AST 26  ALT 17  ALKPHOS 62  BILITOT 0.4  PROT 5.8*  ALBUMIN 3.1*   No results for input(s): LIPASE, AMYLASE in the last 168 hours. No results for input(s): AMMONIA in the last 168 hours. Coagulation Profile:  Recent Labs Lab 12/15/15 1955  INR 1.76   Cardiac Enzymes: No results for input(s): CKTOTAL, CKMB, CKMBINDEX, TROPONINI in the last 168 hours. BNP (last 3 results)  Recent Labs  12/13/15 1704  PROBNP 215.0*   HbA1C: No results for input(s): HGBA1C in the last 72 hours. CBG:  Recent Labs Lab 12/17/15 0743 12/18/15 0743  GLUCAP 89 112*   Lipid Profile: No results for input(s): CHOL, HDL, LDLCALC, TRIG, CHOLHDL, LDLDIRECT in the last 72 hours. Thyroid Function Tests: No results for input(s): TSH, T4TOTAL, FREET4, T3FREE, THYROIDAB in the last 72 hours. Anemia Panel: No results for input(s): VITAMINB12, FOLATE, FERRITIN, TIBC, IRON, RETICCTPCT in the last 72 hours. Sepsis Labs: No results for input(s): PROCALCITON, LATICACIDVEN in the last 168 hours.  Recent Results (from the past 240 hour(s))  Culture, body fluid-bottle     Status: None (Preliminary result)   Collection Time: 12/16/15  6:13 PM  Result Value Ref Range Status   Specimen Description FLUID PERICARDIAL  Final   Special Requests BOTTLES DRAWN AEROBIC AND ANAEROBIC 10CC  Final   Culture NO GROWTH < 24 HOURS  Final   Report Status PENDING  Incomplete  Gram stain     Status: None   Collection Time: 12/16/15  6:13 PM  Result Value Ref Range Status   Specimen Description FLUID PERICARDIAL  Final   Special Requests NONE  Final   Gram Stain   Final    ABUNDANT WBC PRESENT, PREDOMINANTLY MONONUCLEAR NO ORGANISMS SEEN    Report Status 12/16/2015 FINAL  Final  MRSA PCR Screening     Status: Abnormal   Collection Time: 12/16/15  8:16 PM  Result  Value Ref Range Status   MRSA by PCR POSITIVE (A) NEGATIVE Final    Comment:        The GeneXpert MRSA Assay (FDA approved for NASAL specimens only), is one component of a comprehensive MRSA colonization surveillance program. It is not intended to diagnose MRSA infection nor to guide or monitor treatment for MRSA infections. RESULT CALLED TO, READ BACK BY AND VERIFIED WITH: ANNA NINO,RN @2317  12/16/15 MKELLY,MLT          Radiology Studies: Dg Chest Port 1 View  Result Date: 12/16/2015 CLINICAL DATA:  Pericardiocentesis for pericardial effusion. EXAM: PORTABLE CHEST 1 VIEW COMPARISON:  CT from 12/15/2015 FINDINGS: There is a catheter noted along the base of the heart with the tip in head the right cardiophrenic angle and base of heart. Mild interstitial prominence is noted without pneumothorax. The cardiac silhouette is enlarged. There is  mild aortic atherosclerosis. No pneumonic consolidation or suspicious osseous lesions. IMPRESSION: Catheter tip is seen along the base the heart to the right of midline. Enlarged cardiac silhouette is noted. There is aortic atherosclerosis. No pneumothorax is identified. Electronically Signed   By: [name] Royalty M.D.   On: 12/16/2015 19:24        Scheduled Meds: . acidophilus  1 capsule Oral Daily  . Chlorhexidine Gluconate Cloth  6 each Topical Q0600  . cholecalciferol  2,000 Units Oral Daily  . colchicine  0.6 mg Oral BID  . folic acid  2 mg Oral Daily  . hydrocortisone sod succinate (SOLU-CORTEF) inj  50 mg Intravenous Once  . metoprolol succinate  75 mg Oral BID  . multivitamin with minerals  1 tablet Oral Daily  . mupirocin ointment  1 application Nasal BID  . pantoprazole  40 mg Oral Daily  . pravastatin  20 mg Oral QPM  . predniSONE  2.5 mg Oral Q breakfast  . sodium chloride flush  3 mL Intravenous Q12H  . verapamil  40 mg Oral Q12H   Continuous Infusions:   LOS: 3 days    Omaya Nieland Tanna Furry, MD [facility] Pager  [phone]  If 7PM-7AM, please contact night-coverage [email or web address] Password [email or web address] 12/18/2015, 2:19 PM

## 2015-12-18 NOTE — Progress Notes (Signed)
Patient Name: [name] Date of Encounter: 12/18/2015  Primary Cardiologist: [facility] Problem List     Principal Problem:   Pericardial effusion Active Problems:   Atrial fibrillation [facility])   Long term current use of anticoagulant therapy   Hypertension   Hyperlipidemia   Rheumatoid arthritis (HCC)   GERD (gastroesophageal reflux disease)     Subjective   Has dyspnea when walking and a dry cough when she takes a deeper breath. Pleurisy is mild.  Inpatient Medications    Scheduled Meds: . acidophilus  1 capsule Oral Daily  . Chlorhexidine Gluconate Cloth  6 each Topical Q0600  . cholecalciferol  2,000 Units Oral Daily  . colchicine  0.6 mg Oral BID  . folic acid  2 mg Oral Daily  . hydrocortisone sod succinate (SOLU-CORTEF) inj  50 mg Intravenous Once  . metoprolol succinate  75 mg Oral BID  . multivitamin with minerals  1 tablet Oral Daily  . mupirocin ointment  1 application Nasal BID  . pantoprazole  40 mg Oral Daily  . pravastatin  20 mg Oral QPM  . predniSONE  2.5 mg Oral Q breakfast  . sodium chloride flush  3 mL Intravenous Q12H  . verapamil  40 mg Oral Q12H   Continuous Infusions:  PRN Meds: acetaminophen **OR** acetaminophen, albuterol, fluticasone, guaiFENesin, HYDROcodone-acetaminophen, metoprolol, traMADol, zolpidem   Vital Signs    Vitals:   12/18/15 0900 12/18/15 1000 12/18/15 1100 12/18/15 1119  BP: 94/72 92/63 (!) 89/63 (!) 89/63  Pulse:    69  Resp: 17 (!) 22 19 18   Temp:    97.7 F (36.5 C)  TempSrc:    Oral  SpO2:    95%  Weight:      Height:        Intake/Output Summary (Last 24 hours) at 12/18/15 1226 Last data filed at 12/18/15 0555  Gross per 24 hour  Intake              120 ml  Output              350 ml  Net             -230 ml   Filed Weights   12/15/15 1759 12/16/15 0205  Weight: 144 lb (65.3 kg) 148 lb 2.4 oz (67.2 kg)    Physical Exam   Still sitting up to be comfortable GEN: Well nourished,  well developed, in no acute distress.  HEENT: Grossly normal.  Neck: Supple, no JVD, carotid bruits, or masses. Cardiac: RRR, no murmurs, rubs, or gallops. No clubbing, cyanosis, edema.  Radials/DP/PT 2+ and equal bilaterally.  Respiratory:  Respirations regular and unlabored, clear to auscultation bilaterally. GI: Soft, nontender, nondistended, BS + x 4. MS: no deformity or atrophy. Skin: warm and dry, no rash. Neuro:  Strength and sensation are intact. Psych: AAOx3.  Normal affect.  Labs    CBC  Recent Labs  12/16/15 0250 12/18/15 0323  WBC 4.6 6.7  HGB 11.2* 12.2  HCT 35.3* 38.8  MCV 93.9 93.7  PLT 289 123456   Basic Metabolic Panel  Recent Labs  12/17/15 0334 12/18/15 0323  NA 141 142  K 3.8 3.9  CL 106 108  CO2 28 25  GLUCOSE 82 102*  BUN <5* 9  CREATININE 0.74 0.80  CALCIUM 8.5* 8.7*   Liver Function Tests  Recent Labs  12/15/15 1955  AST 26  ALT 17  ALKPHOS 62  BILITOT 0.4  PROT  5.8*  ALBUMIN 3.1*     Telemetry    AF, fair rate control at rest - Personally Reviewed  ECG    AFib, rate controlled, no ST changes - Personally Reviewed  Radiology    Dg Chest Port 1 View  Result Date: 12/16/2015 CLINICAL DATA:  Pericardiocentesis for pericardial effusion. EXAM: PORTABLE CHEST 1 VIEW COMPARISON:  CT from 12/15/2015 FINDINGS: There is a catheter noted along the base of the heart with the tip in head the right cardiophrenic angle and base of heart. Mild interstitial prominence is noted without pneumothorax. The cardiac silhouette is enlarged. There is mild aortic atherosclerosis. No pneumonic consolidation or suspicious osseous lesions. IMPRESSION: Catheter tip is seen along the base the heart to the right of midline. Enlarged cardiac silhouette is noted. There is aortic atherosclerosis. No pneumothorax is identified. Electronically Signed   By: [name] Royalty M.D.   On: 12/16/2015 19:24    Cardiac Studies   11/30 echo - This was a very limited study  for the evaluation of pericardial   effusion post pericardiocentesis. Prior to the procedure the   effusion is moderate to severe.   There is significant decrease in pericardial effusion post   procedure, now mild.   Patient Profile     75 yo woman with chronic warfarin anticoagulation for atrial fibrillation, admitted with hemopericardium. Drain removed 12/1.  Assessment & Plan    On low dose prednisone and colchicine. Plan to hold anticoagulation for 2-4 weeks. Discussed the fact that the risk of embolism is very low, although not zero, during that short time. Repeat limited echo in AM and anticipate DC unless unexpected fluid reaccummulation. She asked about the Watchman device and we reviewed its indications.  Signed, Sanda Klein, MD  12/18/2015, 12:26 PM

## 2015-12-19 MED ORDER — COLCHICINE 0.6 MG PO TABS: 0.6000 mg | ORAL_TABLET | Freq: Two times a day (BID) | ORAL | 2 refills | Status: DC

## 2015-12-19 MED ORDER — COLCRYS 0.6 MG PO TABS: 0.6000 mg | ORAL_TABLET | Freq: Every day | ORAL | 2 refills | Status: DC

## 2015-12-19 MED ORDER — HYDROCODONE-ACETAMINOPHEN 5-325 MG PO TABS: 1.0000 | ORAL_TABLET | ORAL | 0 refills | Status: DC | PRN

## 2015-12-19 MED ORDER — METOPROLOL SUCCINATE ER 25 MG PO TB24: 75.0000 mg | ORAL_TABLET | Freq: Two times a day (BID) | ORAL | 1 refills | Status: DC

## 2015-12-19 MED ORDER — COLCRYS 0.6 MG PO TABS: 0.6000 mg | ORAL_TABLET | Freq: Two times a day (BID) | ORAL | 2 refills | Status: DC

## 2015-12-19 NOTE — Progress Notes (Signed)
Patient Name: [name] Date of Encounter: 12/19/2015  Primary Cardiologist: [facility] Problem List     Principal Problem:   Pericardial effusion Active Problems:   Atrial fibrillation [facility])   Long term current use of anticoagulant therapy   Hypertension   Hyperlipidemia   Rheumatoid arthritis ([place])   GERD (gastroesophageal reflux disease)     Subjective   Eager to go home. Cough has improved, denies dyspnea. Still has pleuritic discomfort when she yawns or takes a very deep breath.  Inpatient Medications    Scheduled Meds: . acidophilus  1 capsule Oral Daily  . Chlorhexidine Gluconate Cloth  6 each Topical Q0600  . cholecalciferol  2,000 Units Oral Daily  . colchicine  0.6 mg Oral BID  . folic acid  2 mg Oral Daily  . hydrocortisone sod succinate (SOLU-CORTEF) inj  50 mg Intravenous Once  . metoprolol succinate  75 mg Oral BID  . multivitamin with minerals  1 tablet Oral Daily  . mupirocin ointment  1 application Nasal BID  . pantoprazole  40 mg Oral Daily  . pravastatin  20 mg Oral QPM  . predniSONE  2.5 mg Oral Q breakfast  . sodium chloride flush  3 mL Intravenous Q12H  . verapamil  40 mg Oral Q12H   Continuous Infusions:  PRN Meds: acetaminophen **OR** acetaminophen, albuterol, fluticasone, guaiFENesin, HYDROcodone-acetaminophen, metoprolol, traMADol, zolpidem   Vital Signs    Vitals:   12/19/15 0600 12/19/15 0700 12/19/15 0737 12/19/15 0800  BP: (!) 158/136  108/84 96/74  Pulse:      Resp: (!) 23 (!) 24 19 (!) 22  Temp:   98 F (36.7 C)   TempSrc:   Oral   SpO2:   98%   Weight:      Height:        Intake/Output Summary (Last 24 hours) at 12/19/15 1038 Last data filed at 12/19/15 0800  Gross per 24 hour  Intake              500 ml  Output                0 ml  Net              500 ml   Filed Weights   12/15/15 1759 12/16/15 0205  Weight: 144 lb (65.3 kg) 148 lb 2.4 oz (67.2 kg)    Physical Exam  Able to lie flat.  Pericardiocentesis site healing well. GEN: Well nourished, well developed, in no acute distress.  HEENT: Grossly normal.  Neck: Supple, no JVD, carotid bruits, or masses. Cardiac: RRR, no murmurs, rubs, or gallops. No clubbing, cyanosis, edema.  Radials/DP/PT 2+ and equal bilaterally.  Respiratory:  Respirations regular and unlabored, clear to auscultation bilaterally. GI: Soft, nontender, nondistended, BS + x 4. MS: no deformity or atrophy. Skin: warm and dry, no rash. Neuro:  Strength and sensation are intact. Psych: AAOx3.  Normal affect.  Labs    CBC  Recent Labs  12/18/15 0323  WBC 6.7  HGB 12.2  HCT 38.8  MCV 93.7  PLT 123456   Basic Metabolic Panel  Recent Labs  12/17/15 0334 12/18/15 0323  NA 141 142  K 3.8 3.9  CL 106 108  CO2 28 25  GLUCOSE 82 102*  BUN <5* 9  CREATININE 0.74 0.80  CALCIUM 8.5* 8.7*     Telemetry    AFib rate controlled- Personally Reviewed  Cardiac Studies   Minimal residual effusion on  yesterday's echo  Patient Profile     75 yo woman with chronic warfarin anticoagulation for atrial fibrillation, admitted with hemopericardium. Drain removed 12/1.  Assessment & Plan    Ready for DC. Will arrange follow up in 2 weeks. Hold warfarin until then, Colchicine for 3 months to reduce risk of relapse. I think she can restart warfarin long term, but we did review the Watchman device.  Signed, Sanda Klein, MD  12/19/2015, 10:38 AM

## 2015-12-19 NOTE — Progress Notes (Addendum)
Patient concerned about cost of colchicine. Verified with [employer] [place] Part D plan that brand name Colcrys tablets are covered at $30/mo. Will send new Rx for brand name. Also patient actually qualifies for once daily dosing since she is <70 kg and the concomitant verapamil (CYP3A4/PGP inhibitor) may increase the concentration of colchicine increasing her risk for AE's associated with this medication. Dr. Cruzita Lederer discussed this with Dr. Sallyanne Kuster who agreed with dose adjustment.    Ruta Hinds. Velva Harman, PharmD, BCPS, CPP Clinical Pharmacist Pager: [phone] Phone: [phone] 12/19/2015 2:15 PM

## 2015-12-19 NOTE — Discharge Instructions (Addendum)
Follow with Dr. Irish Lack in 2 weeks  Please get a complete blood count and chemistry panel checked by your Primary MD at your next visit, and again as instructed by your Primary MD. Please get your medications reviewed and adjusted by your Primary MD.  Please request your Primary MD to go over all Hospital Tests and Procedure/Radiological results at the follow up, please get all Hospital records sent to your Prim MD by signing hospital release before you go home.  If you had Pneumonia of Lung problems at the Hospital: Please get a 2 view Chest X ray done in 6-8 weeks after hospital discharge or sooner if instructed by your Primary MD.  If you have Congestive Heart Failure: Please call your Cardiologist or Primary MD anytime you have any of the following symptoms:  1) 3 pound weight gain in 24 hours or 5 pounds in 1 week  2) shortness of breath, with or without a dry hacking cough  3) swelling in the hands, feet or stomach  4) if you have to sleep on extra pillows at night in order to breathe  Follow cardiac low salt diet and 1.5 lit/day fluid restriction.  If you have diabetes Accuchecks 4 times/day, Once in AM empty stomach and then before each meal. Log in all results and show them to your primary doctor at your next visit. If any glucose reading is under 80 or above 300 call your primary MD immediately.  If you have Seizure/Convulsions/Epilepsy: Please do not drive, operate heavy machinery, participate in activities at heights or participate in high speed sports until you have seen by Primary MD or a Neurologist and advised to do so again.  If you had Gastrointestinal Bleeding: Please ask your Primary MD to check a complete blood count within one week of discharge or at your next visit. Your endoscopic/colonoscopic biopsies that are pending at the time of discharge, will also need to followed by your Primary MD.  Get Medicines reviewed and adjusted. Please take all your medications  with you for your next visit with your Primary MD  Please request your Primary MD to go over all hospital tests and procedure/radiological results at the follow up, please ask your Primary MD to get all Hospital records sent to his/her office.  If you experience worsening of your admission symptoms, develop shortness of breath, life threatening emergency, suicidal or homicidal thoughts you must seek medical attention immediately by calling 911 or calling your MD immediately  if symptoms less severe.  You must read complete instructions/literature along with all the possible adverse reactions/side effects for all the Medicines you take and that have been prescribed to you. Take any new Medicines after you have completely understood and accpet all the possible adverse reactions/side effects.   Do not drive or operate heavy machinery when taking Pain medications.   Do not take more than prescribed Pain, Sleep and Anxiety Medications  Special Instructions: If you have smoked or chewed Tobacco  in the last 2 yrs please stop smoking, stop any regular Alcohol  and or any Recreational drug use.  Wear Seat belts while driving.  Please note You were cared for by a hospitalist during your hospital stay. If you have any questions about your discharge medications or the care you received while you were in the hospital after you are discharged, you can call the unit and asked to speak with the hospitalist on call if the hospitalist that took care of you is not available. Once you  are discharged, your primary care physician will handle any further medical issues. Please note that NO REFILLS for any discharge medications will be authorized once you are discharged, as it is imperative that you return to your primary care physician (or establish a relationship with a primary care physician if you do not have one) for your aftercare needs so that they can reassess your need for medications and monitor your lab  values.  You can reach the hospitalist office at phone [phone] or fax [phone]   If you do not have a primary care physician, you can call [phone] for a physician referral.  Activity: As tolerated with Full fall precautions use walker/cane & assistance as needed  Diet: heart healthy  Disposition Home    Colchicine tablets or capsules What is this medicine? COLCHICINE (KOL chi seen) is for joint pain and swelling due to attacks of acute gouty arthritis. The medicine is also used to treat familial Mediterranean fever. This medicine may be used for other purposes; ask your health care provider or pharmacist if you have questions. COMMON BRAND NAME(S): Colcrys, MITIGARE What should I tell my health care provider before I take this medicine? They need to know if you have any of these conditions: -anemia -blood disorders like leukemia or lymphoma -heart disease -immune system problems -intestinal disease -kidney disease -liver disease -muscle pain or weakness -take other medicines -stomach problems -an unusual or allergic reaction to colchicine, other medicines, lactose, foods, dyes, or preservatives -pregnant or trying to get pregnant -breast-feeding How should I use this medicine? Take this medicine by mouth with a full glass of water. Follow the directions on the prescription label. You can take it with or without food. If it upsets your stomach, take it with food. Take your medicine at regular intervals. Do not take your medicine more often than directed. A special MedGuide will be given to you by the pharmacist with each prescription and refill. Be sure to read this information carefully each time. Talk to your pediatrician regarding the use of this medicine in children. While this drug may be prescribed for children as young as 80 years old for selected conditions, precautions do apply. Patients over 68 years old may have a stronger reaction and need a smaller  dose. Overdosage: If you think you have taken too much of this medicine contact a poison control center or emergency room at once. NOTE: This medicine is only for you. Do not share this medicine with others. What if I miss a dose? If you miss a dose, take it as soon as you can. If it is almost time for your next dose, take only that dose. Do not take double or extra doses. What may interact with this medicine? Do not take this medicine with any of the following medications: -certain medicines for fungal infections like itraconazole This medicine may also interact with the following medications: -alcohol -certain medicines for cholesterol like atorvastatin -certain medicines for coughs and colds -certain medicines to help you breathe better -cyclosporine -digoxin -epinephrine -grapefruit or grapefruit juice -methenamine -other medicines for fungal infection -sodium bicarbonate -some antibiotics like clarithromycin, erythromycin, and telithromycin -some medicines for an irregular heartbeat or other heart problems -some medicines for cancer, like lapatinib and tamoxifen -some medicines for HIV This list may not describe all possible interactions. Give your health care provider a list of all the medicines, herbs, non-prescription drugs, or dietary supplements you use. Also tell them if you smoke, drink alcohol, or use illegal drugs.  Some items may interact with your medicine. What should I watch for while using this medicine? Visit your doctor or health care professional for regular checks on your progress. You may need periodic blood checks. Alcohol can increase the chance of getting stomach problems and gout attacks. Do not drink alcohol. What side effects may I notice from receiving this medicine? Side effects that you should report to your doctor or health care professional as soon as possible: -allergic reactions like skin rash, itching or hives, swelling of the face, lips, or  tongue -fever, chills, or sore throat -muscle tenderness, pain, or weakness -numbness or tingling in hands or feet -unusual bleeding or bruising -unusually weak or tired -vomiting Side effects that usually do not require medical attention (report to your doctor or health care professional if they continue or are bothersome): -diarrhea -hair loss -loss of appetite -stomach pain or nausea This list may not describe all possible side effects. Call your doctor for medical advice about side effects. You may report side effects to FDA at 1-800-FDA-1088. Where should I keep my medicine? Keep out of the reach of children. Store at room temperature between 15 and 30 degrees C (59 and 86 degrees F). Keep container tightly closed. Protect from light. Throw away any unused medicine after the expiration date. NOTE: This sheet is a summary. It may not cover all possible information. If you have questions about this medicine, talk to your doctor, pharmacist, or health care provider.  2017 Elsevier/Gold Standard (2012-07-01 16:48:38)  Pericardial Effusion Pericardial effusion is a buildup of fluid around your heart. The heart is surrounded by a double-layered sac (pericardium). This sac normally contains a small amount of fluid. When too much builds up, it can put pressure on your heart and cause problems. As fluid builds up in the pericardial sac and pressure on your heart increases, it becomes harder for your heart to pump blood. When fluid prevents your heart from pumping enough blood, it is called cardiac tamponade. Cardiac tamponade is a life-threatening condition. CAUSES  Often the cause of pericardial effusion is not known (idiopathic effusion). Possible causes are from:  Infections, such as from a virus, bacteria, fungus, or parasite.  Damage to the pericardium from heart surgery or a heart attack.  Inflammatory diseases, such as rheumatoid arthritis or lupus.  Kidney disease.  Thyroid  disease.  Cancer.  Cancer treatment, including radiation or chemotherapy.  Certain drugs, including tuberculosis drugs or seizure drugs.  Chest trauma. SIGNS AND SYMPTOMS  Pericardial effusion may not cause symptoms at first, especially if the fluid builds up slowly. In time, pressure on the heart may cause:  Chest pain.  Trouble breathing.  Pain and shortness of breath that is worse when lying down.  Dizziness.  Fainting.  Cough.  Hiccups.  Skipped heartbeats (palpitations).  Anxiety and confusion.  A bluish skin color (cyanosis).  Swollen legs and ankles. DIAGNOSIS  Your health care provider may suspect pericardial effusion based on your symptoms. Your health care provider may also do a physical exam to check for:  Low blood pressure and weak pulses.  Soft (muffled) heart sounds.  Rapid heartbeat.  Full veins in your neck (distended jugular veins).  Decreased breathing sounds and a rubbing sound (friction rub) when listening to your lungs. Your health care provider may also do several tests to confirm the diagnosis and find out what is causing the pericardial effusion. These may include:  Chest X-ray.  Imaging study of the heart using sound waves (  echocardiogram).  CT scan or MRI.  Electrical study of the heart (electrocardiogram [ECG]).  A procedure using a needle to remove fluid from the pericardium for examination (pericardiocentesis).  Blood tests to check for:  Infection.  Heart damage.  Thyroid abnormalities.  Kidney disease.  Inflammatory disorders. TREATMENT  Treatment for pericardial effusion depends on the cause of your symptoms and how severe your symptoms are. If a specific cause was found, that cause will be treated. Treatment may include:  Medicines, such as:  Nonsteroidal anti-inflammatory drugs (NSAIDs).  Other anti-inflammatory drugs, such as steroids.  Antibiotic medicine.  Antifungal medicine.  Hospital treatment  may be necessary, such as for cardiac tamponade. This may include:  Intravenous (IV) fluids.  Breathing support.  Surgery may be needed in severe cases. Surgery may include:  Pericardiocentesis.  Open heart surgery.  A procedure to make a permanent opening in the pericardium (pericardial window). SEEK MEDICAL CARE IF:  You feel dizzy or light-headed.  You have swelling in your legs or ankles.  You have heart palpitations.  You have persistent cough or hiccups. SEEK IMMEDIATE MEDICAL CARE IF:   You faint.  You have chest pain.  You have trouble breathing. These symptoms may represent a serious problem that is an emergency. Do not wait to see if the symptoms will go away. Get medical help right away. Call your local emergency services  (911 in the [place] Do not drive yourself to the hospital.  MAKE SURE YOU:  Understand these instructions.  Will watch your condition.  Will get help right away if you are not doing well or get worse. This information is not intended to replace advice given to you by your health care provider. Make sure you discuss any questions you have with your health care provider. Document Released: 08/30/2004 Document Revised: 01/23/2014 Document Reviewed: 05/22/2013 Elsevier Interactive Patient Education  2017 [employer].  Atrial Fibrillation Atrial fibrillation is a type of irregular or rapid heartbeat (arrhythmia). In atrial fibrillation, the heart quivers continuously in a chaotic pattern. This occurs when parts of the heart receive disorganized signals that make the heart unable to pump blood normally. This can increase the risk for stroke, heart failure, and other heart-related conditions. There are different types of atrial fibrillation, including:  Paroxysmal atrial fibrillation. This type starts suddenly, and it usually stops on its own shortly after it starts.  Persistent atrial fibrillation. This type often lasts longer than a week. It may  stop on its own or with treatment.  Long-lasting persistent atrial fibrillation. This type lasts longer than 12 months.  Permanent atrial fibrillation. This type does not go away. Talk with your health care provider to learn about the type of atrial fibrillation that you have. What are the causes? This condition is caused by some heart-related conditions or procedures, including:  A heart attack.  Coronary artery disease.  Heart failure.  Heart valve conditions.  High blood pressure.  Inflammation of the sac that surrounds the heart (pericarditis).  Heart surgery.  Certain heart rhythm disorders, such as Wolf-Parkinson-White syndrome. Other causes include:  Pneumonia.  Obstructive sleep apnea.  Blockage of an artery in the lungs (pulmonary embolism, or PE).  Lung cancer.  Chronic lung disease.  Thyroid problems, especially if the thyroid is overactive (hyperthyroidism).  Caffeine.  Excessive alcohol use or illegal drug use.  Use of some medicines, including certain decongestants and diet pills. Sometimes, the cause cannot be found. What increases the risk? This condition is more likely to  develop in:  People who are older in age.  People who smoke.  People who have diabetes mellitus.  People who are overweight (obese).  Athletes who exercise vigorously. What are the signs or symptoms? Symptoms of this condition include:  A feeling that your heart is beating rapidly or irregularly.  A feeling of discomfort or pain in your chest.  Shortness of breath.  Sudden light-headedness or weakness.  Getting tired easily during exercise. In some cases, there are no symptoms. How is this diagnosed? Your health care provider may be able to detect atrial fibrillation when taking your pulse. If detected, this condition may be diagnosed with:  An electrocardiogram (ECG).  A Holter monitor test that records your heartbeat patterns over a 24-hour  period.  Transthoracic echocardiogram (TTE) to evaluate how blood flows through your heart.  Transesophageal echocardiogram (TEE) to view more detailed images of your heart.  A stress test.  Imaging tests, such as a CT scan or chest X-ray.  Blood tests. How is this treated? The main goals of treatment are to prevent blood clots from forming and to keep your heart beating at a normal rate and rhythm. The type of treatment that you receive depends on many factors, such as your underlying medical conditions and how you feel when you are experiencing atrial fibrillation. This condition may be treated with:  Medicine to slow down the heart rate, bring the hearts rhythm back to normal, or prevent clots from forming.  Electrical cardioversion. This is a procedure that resets your hearts rhythm by delivering a controlled, low-energy shock to the heart through your skin.  Different types of ablation, such as catheter ablation, catheter ablation with pacemaker, or surgical ablation. These procedures destroy the heart tissues that send abnormal signals. When the pacemaker is used, it is placed under your skin to help your heart beat in a regular rhythm. Follow these instructions at home:  Take over-the counter and prescription medicines only as told by your health care provider.  If your health care provider prescribed a blood-thinning medicine (anticoagulant), take it exactly as told. Taking too much blood-thinning medicine can cause bleeding. If you do not take enough blood-thinning medicine, you will not have the protection that you need against stroke and other problems.  Do not use tobacco products, including cigarettes, chewing tobacco, and e-cigarettes. If you need help quitting, ask your health care provider.  If you have obstructive sleep apnea, manage your condition as told by your health care provider.  Do not drink alcohol.  Do not drink beverages that contain caffeine, such as  coffee, soda, and tea.  Maintain a healthy weight. Do not use diet pills unless your health care provider approves. Diet pills may make heart problems worse.  Follow diet instructions as told by your health care provider.  Exercise regularly as told by your health care provider.  Keep all follow-up visits as told by your health care provider. This is important. How is this prevented?  Avoid drinking beverages that contain caffeine or alcohol.  Avoid certain medicines, especially medicines that are used for breathing problems.  Avoid certain herbs and herbal medicines, such as those that contain ephedra or ginseng.  Do not use illegal drugs, such as cocaine and amphetamines.  Do not smoke.  Manage your high blood pressure. Contact a health care provider if:  You notice a change in the rate, rhythm, or strength of your heartbeat.  You are taking an anticoagulant and you notice increased bruising.  You tire more easily when you exercise or exert yourself. Get help right away if:  You have chest pain, abdominal pain, sweating, or weakness.  You feel nauseous.  You notice blood in your vomit, bowel movement, or urine.  You have shortness of breath.  You suddenly have swollen feet and ankles.  You feel dizzy.  You have sudden weakness or numbness of the face, arm, or leg, especially on one side of the body.  You have trouble speaking, trouble understanding, or both (aphasia).  Your face or your eyelid droops on one side. These symptoms may represent a serious problem that is an emergency. Do not wait to see if the symptoms will go away. Get medical help right away. Call your local emergency services (911 in the [place] Do not drive yourself to the hospital.  This information is not intended to replace advice given to you by your health care provider. Make sure you discuss any questions you have with your health care provider. Document Released: 01/02/2005 Document Revised:  05/12/2015 Document Reviewed: 04/29/2014 Elsevier Interactive Patient Education  2017 [employer].

## 2015-12-19 NOTE — Progress Notes (Signed)
Discharge instructions discussed with patient and daughter, no questions or concerns. Prescriptions given, all belongings gathered and sent home with patient.

## 2015-12-19 NOTE — Discharge Summary (Addendum)
Physician Discharge Summary  [name] [id number] DOB: 1940-10-27 DOA: 12/15/2015  PCP: Leeroy Cha, MD  Admit date: 12/15/2015 Discharge date: 12/19/2015  Admitted From: home Disposition:  home  Recommendations for Outpatient Follow-up:  1. Follow up with Dr. Irish Lack In 2 weeks 2. Hold Coumadin on discharge, to be reevaluated as an outpatient whether and when this can be resumed  Home Health: PT Equipment/Devices: none  Discharge Condition: stable CODE STATUS: Full Diet recommendation: Heart healthy  HPI: per Dr. Blaine Hamper, [name] is a 75 y.o. female with medical history significant of Hypertension, hyperlipidemia, GERD, rheumatoid arthritis, pulmonary hypertension, atrial fibrillation on Coumadin, recently treated for pneumonia, who presents with shortness of breath and upper back pain. Pt state that she was recently treated for pneumonia. She completed 7 day course of Levaquin. She does not have to fever, chills, chest pain today, but continues to have SOB and dry cough. She states that she has been having shortness of breath in the past 3 weeks. It is aggravated when exertion. She also has upper back pain, which is constant, 10 out of 10 in severity, nonradiating. It is not aggravated or alleviated by any known factors. She has been seen and evaluated by pulmonology as an outpatient. Today she had an outpatient CT scan which showed large pericardial effusion. Patient denies nausea, vomiting, abdominal pain, diarrhea, symptoms of UTI or unilateral weakness.  Hospital Course: Discharge Diagnoses:  Principal Problem:   Pericardial effusion Active Problems:   Atrial fibrillation (HCC)   Long term current use of anticoagulant therapy   Hypertension   Hyperlipidemia   Rheumatoid arthritis (HCC)   GERD (gastroesophageal reflux disease)  Pericardial effusion - 2d echo done on 11/30 with depressed EF 45-50% and moderate to severe circumferential effusion,  patient was taken to the cath lab 11/30 pm for pericardiocentesis and pericardial drain placement with removal of 500 mL of dark sanguinous fluid, transferred to ICU afterwards. Fluid with inflammatory characteristics. Cardiology followed patient hospitalized. She underwent repeat echocardiograms in the next couple of days, without significant fluid accumulation. She eventually had her drain removed, seen by cardiology, clear for discharge and will have close outpatient follow-up in the next couple weeks. For now we'll hold Coumadin, she is to be reevaluated in the next few weeks and to be determined when she could resume this again. Atrial Fibrillation - CHA2DS2-VASc Scoreis 4, Coumadin on hold for now as above, Continue metoprolol and verapamil, metoprolol dose increased per cardiology Rheumatoid arthritis  - On prednisone 2.5 mg daily, methotrexate and Etenercept. Clinically stable. Liver function normal HLD - continue home statin HTN - continue metoprolol, Verapamil Shortness of breath - Likely due to multifactorial, including pericardial effusion, recent pneumonia. No signs of infection. Improved   Discharge Instructions     Medication List    STOP taking these medications   warfarin 5 MG tablet Commonly known as:  COUMADIN     TAKE these medications   acidophilus Caps capsule Take 1 capsule by mouth daily.   cholecalciferol 1000 units tablet Commonly known as:  VITAMIN D Take 2,000 Units by mouth daily.   colchicine 0.6 MG tablet Take 1 tablet (0.6 mg total) by mouth 2 (two) times daily.   etanercept 25 MG injection Commonly known as:  ENBREL Inject 25 mg into the skin once a week.   fluticasone 50 MCG/ACT nasal spray Commonly known as:  FLONASE Place 2 sprays into both nostrils daily as needed for allergies or rhinitis.  folic acid 1 MG tablet Commonly known as:  FOLVITE Take 2 mg by mouth daily.   furosemide 40 MG tablet Commonly known as:  LASIX TAKE 1 TABLET BY  MOUTH EVERY DAY OR AS DIRECTED   guaiFENesin 600 MG 12 hr tablet Commonly known as:  MUCINEX Take by mouth 2 (two) times daily as needed for cough.   HYDROcodone-acetaminophen 5-325 MG tablet Commonly known as:  NORCO/VICODIN Take 1 tablet by mouth every 4 (four) hours as needed for moderate pain or severe pain.   methotrexate 2.5 MG tablet Commonly known as:  RHEUMATREX Take three (3) tablets (7.5 mg total) by mouth every Tuesday; Take two (2) tablets (5 mg total) by mouth every Wednesday.   metoprolol succinate 25 MG 24 hr tablet Commonly known as:  TOPROL-XL Take 3 tablets (75 mg total) by mouth 2 (two) times daily. Take with or immediately following a meal. What changed:  See the new instructions.   multivitamin with minerals tablet Take 1 tablet by mouth daily.   omeprazole 20 MG capsule Commonly known as:  PRILOSEC Take 20 mg by mouth every morning.   potassium chloride 10 MEQ tablet Commonly known as:  K-DUR,KLOR-CON Take 10 mEq by mouth daily.   pravastatin 20 MG tablet Commonly known as:  PRAVACHOL TAKE 1 TABLET BY MOUTH EVERY EVENING   predniSONE 5 MG tablet Commonly known as:  DELTASONE Take 2 tablet for 2 days then resume prior home dose of 2.5 mg daily What changed:  how much to take  when to take this  additional instructions   traMADol 50 MG tablet Commonly known as:  ULTRAM Take 1 tablet (50 mg total) by mouth every 6 (six) hours as needed for severe pain.   verapamil 40 MG tablet Commonly known as:  CALAN Take 1 tablet (40 mg total) by mouth every 12 (twelve) hours.      Follow-up Information    Leeroy Cha, MD. Schedule an appointment as soon as possible for a visit in 2 week(s).   Specialty:  Internal Medicine Contact information: [address] [place] [address] [phone]        Larae Grooms, MD. Schedule an appointment as soon as possible for a visit in 2 week(s).   Specialties:  Cardiology,  Radiology, Interventional Cardiology Contact information: [id number] [place] [address] [phone]        [facility] Follow up.   Why:  home health physical therapy Contact information: [place] [address] [phone]          Allergies  Allergen Reactions  . Arthrotec [Diclofenac-Misoprostol] Diarrhea  . Erythromycin Other (See Comments)    Anaphylaxis   . Morphine Sulfate Other (See Comments)    Erratic behavior; hallucinations  . Diltiazem Hcl Swelling and Rash    Able to tolerate Verapamil  . Gold-Containing Drug Products Rash  . Macrodantin [Nitrofurantoin Macrocrystal] Rash  . Naproxen Rash  . Penicillins Rash    Amoxicillin Has patient had a PCN reaction causing immediate rash, facial/tongue/throat swelling, SOB or lightheadedness with hypotension: Yes Has patient had a PCN reaction causing severe rash involving mucus membranes or skin necrosis: Yes Has patient had a PCN reaction that required hospitalization No Has patient had a PCN reaction occurring within the last 10 years: No If all of the above answers are "NO", then may proceed with Cephalosporin use.     Consultations:  Cardiology   Procedures/Studies:  2D echo   Pericardiocentesis  Dg Chest 2 View  Result Date: 12/13/2015 CLINICAL DATA:  Follow-up of pneumonia. The patient has undergone antibiotic treatment. The patient reports persistent chest pain and shortness of breath. History of atrial fibrillation. EXAM: CHEST  2 VIEW COMPARISON:  PA and lateral chest x-ray dated September 15, 2015 FINDINGS: The cardiopericardial silhouette remains quite enlarged and has increased in conspicuity since the previous study. The lungs are well-expanded. There is no focal infiltrate. The pulmonary vascularity is not engorged. There is calcification in the wall of the aortic arch. There is mild multilevel degenerative disc disease of the thoracic  spine. IMPRESSION: No evidence of pneumonia. Marked enlargement of the cardiac silhouette which is more conspicuous than on the previous study. This may reflect the presence of a pericardial effusion. No pulmonary edema. Thoracic aortic atherosclerosis. Electronically Signed   By: David  Martinique M.D.   On: 12/13/2015 16:04   Ct Chest W Contrast  Result Date: 12/15/2015 CLINICAL DATA:  Increased cough, shortness of breath, weakness and decreased appetite over the past few weeks. Loculated pleural effusion. EXAM: CT CHEST WITH CONTRAST TECHNIQUE: Multidetector CT imaging of the chest was performed during intravenous contrast administration. CONTRAST:  82mL ISOVUE-300 IOPAMIDOL (ISOVUE-300) INJECTION 61% COMPARISON:  05/24/2015. FINDINGS: Cardiovascular: Atherosclerotic calcification of the arterial vasculature. Heart is mildly enlarged. Large pericardial effusion is new from 05/24/2015. Mediastinum/Nodes: Mediastinal lymph nodes are not enlarged by CT size criteria. No hilar or axillary adenopathy. Esophagus is grossly unremarkable. Tiny hiatal hernia. Lungs/Pleura: Mild volume loss in the medial aspects of the right middle lobe and lingula. Minimal dependent atelectasis bilaterally. Lungs are otherwise clear. No pleural fluid. Airway is unremarkable. Upper Abdomen: Visualized portions of the liver, adrenal glands, kidneys, spleen, pancreas, stomach and bowel are grossly unremarkable with exception of a tiny hiatal hernia. Upper abdominal lymph nodes are not enlarged by CT size criteria. Musculoskeletal: No worrisome lytic or sclerotic lesions. Degenerative changes are seen in the spine. IMPRESSION: 1. Large pericardial effusion, new from 05/24/2015. These results will be called to the ordering clinician or representative by the Radiologist Assistant, and communication documented in the PACS or zVision Dashboard. 2.  Aortic atherosclerosis (ICD10-170.0). Electronically Signed   By: Lorin Picket M.D.   On:  12/15/2015 16:08   Dg Chest Port 1 View  Result Date: 12/16/2015 CLINICAL DATA:  Pericardiocentesis for pericardial effusion. EXAM: PORTABLE CHEST 1 VIEW COMPARISON:  CT from 12/15/2015 FINDINGS: There is a catheter noted along the base of the heart with the tip in head the right cardiophrenic angle and base of heart. Mild interstitial prominence is noted without pneumothorax. The cardiac silhouette is enlarged. There is mild aortic atherosclerosis. No pneumonic consolidation or suspicious osseous lesions. IMPRESSION: Catheter tip is seen along the base the heart to the right of midline. Enlarged cardiac silhouette is noted. There is aortic atherosclerosis. No pneumothorax is identified. Electronically Signed   By: [name] Royalty M.D.   On: 12/16/2015 19:24    Subjective: - no chest pain, no abdominal pain, nausea or vomiting.  - Complains of mild shortness of breath, chronic  Discharge Exam: Vitals:   12/19/15 1232 12/19/15 1300  BP: 94/73 108/61  Pulse:    Resp: (!) 22 18  Temp: 98.5 F (36.9 C)    Vitals:   12/19/15 0737 12/19/15 0800 12/19/15 1232 12/19/15 1300  BP: 108/84 96/74 94/73  108/61  Pulse:      Resp: 19 (!) 22 (!) 22 18  Temp: 98 F (36.7  C)  98.5 F (36.9 C)   TempSrc: Oral  Oral   SpO2: 98%  99%   Weight:      Height:        General: Pt is alert, awake, not in acute distress Cardiovascular: irregular, no rubs, no gallops Respiratory: CTA bilaterally, no wheezing, no rhonchi Abdominal: Soft, NT, ND, bowel sounds + Extremities: no edema, no cyanosis    The results of significant diagnostics from this hospitalization (including imaging, microbiology, ancillary and laboratory) are listed below for reference.     Microbiology: Recent Results (from the past 240 hour(s))  Culture, body fluid-bottle     Status: None (Preliminary result)   Collection Time: 12/16/15  6:13 PM  Result Value Ref Range Status   Specimen Description FLUID PERICARDIAL  Final   Special  Requests BOTTLES DRAWN AEROBIC AND ANAEROBIC 10CC  Final   Culture NO GROWTH 2 DAYS  Final   Report Status PENDING  Incomplete  Gram stain     Status: None   Collection Time: 12/16/15  6:13 PM  Result Value Ref Range Status   Specimen Description FLUID PERICARDIAL  Final   Special Requests NONE  Final   Gram Stain   Final    ABUNDANT WBC PRESENT, PREDOMINANTLY MONONUCLEAR NO ORGANISMS SEEN    Report Status 12/16/2015 FINAL  Final  MRSA PCR Screening     Status: Abnormal   Collection Time: 12/16/15  8:16 PM  Result Value Ref Range Status   MRSA by PCR POSITIVE (A) NEGATIVE Final    Comment:        The GeneXpert MRSA Assay (FDA approved for NASAL specimens only), is one component of a comprehensive MRSA colonization surveillance program. It is not intended to diagnose MRSA infection nor to guide or monitor treatment for MRSA infections. RESULT CALLED TO, READ BACK BY AND VERIFIED WITH: ANNA NINO,RN @2317  12/16/15 MKELLY,MLT      Labs: BNP (last 3 results)  Recent Labs  12/15/15 2047  BNP [id number]   Basic Metabolic Panel:  Recent Labs Lab 12/13/15 1704 12/15/15 1955 12/16/15 0250 12/16/15 1934 12/17/15 0334 12/18/15 0323  NA 140 137 139  --  141 142  K 3.6 3.7 3.7  --  3.8 3.9  CL 103 107 108  --  106 108  CO2 30 24 25   --  28 25  GLUCOSE 99 93 101* 84 82 102*  BUN 9 5* 5*  --  <5* 9  CREATININE 0.77 0.72 0.71  --  0.74 0.80  CALCIUM 9.1 8.6* 8.8*  --  8.5* 8.7*   Liver Function Tests:  Recent Labs Lab 12/15/15 1955  AST 26  ALT 17  ALKPHOS 62  BILITOT 0.4  PROT 5.8*  ALBUMIN 3.1*   CBC:  Recent Labs Lab 12/15/15 1955 12/16/15 0250 12/18/15 0323  WBC 5.8 4.6 6.7  HGB 10.6* 11.2* 12.2  HCT 32.4* 35.3* 38.8  MCV 93.6 93.9 93.7  PLT 301 289 304   CBG:  Recent Labs Lab 12/17/15 0743 12/18/15 0743  GLUCAP 89 112*   Urinalysis    Component Value Date/Time   COLORURINE AMBER (A) 05/23/2015 1941   APPEARANCEUR CLEAR 05/23/2015 1941    LABSPEC 1.021 05/23/2015 1941   PHURINE 6.5 05/23/2015 1941   GLUCOSEU NEGATIVE 05/23/2015 1941   HGBUR MODERATE (A) 05/23/2015 1941   BILIRUBINUR NEGATIVE 05/23/2015 1941   KETONESUR NEGATIVE 05/23/2015 1941   PROTEINUR 30 (A) 05/23/2015 1941   UROBILINOGEN 0.2 10/29/2013 1932  NITRITE NEGATIVE 05/23/2015 1941   LEUKOCYTESUR NEGATIVE 05/23/2015 1941   Sepsis Labs Invalid input(s): PROCALCITONIN,  WBC,  LACTICIDVEN Microbiology Recent Results (from the past 240 hour(s))  Culture, body fluid-bottle     Status: None (Preliminary result)   Collection Time: 12/16/15  6:13 PM  Result Value Ref Range Status   Specimen Description FLUID PERICARDIAL  Final   Special Requests BOTTLES DRAWN AEROBIC AND ANAEROBIC 10CC  Final   Culture NO GROWTH 2 DAYS  Final   Report Status PENDING  Incomplete  Gram stain     Status: None   Collection Time: 12/16/15  6:13 PM  Result Value Ref Range Status   Specimen Description FLUID PERICARDIAL  Final   Special Requests NONE  Final   Gram Stain   Final    ABUNDANT WBC PRESENT, PREDOMINANTLY MONONUCLEAR NO ORGANISMS SEEN    Report Status 12/16/2015 FINAL  Final  MRSA PCR Screening     Status: Abnormal   Collection Time: 12/16/15  8:16 PM  Result Value Ref Range Status   MRSA by PCR POSITIVE (A) NEGATIVE Final    Comment:        The GeneXpert MRSA Assay (FDA approved for NASAL specimens only), is one component of a comprehensive MRSA colonization surveillance program. It is not intended to diagnose MRSA infection nor to guide or monitor treatment for MRSA infections. RESULT CALLED TO, READ BACK BY AND VERIFIED WITH: ANNA NINO,RN @2317  12/16/15 MKELLY,MLT    Time coordinating discharge: Over 30 minutes  SIGNED:  Marzetta Board, MD  [facility] 12/19/2015, 1:47 PM Pager [phone]  If 7PM-7AM, please contact night-coverage [email or web address] Password [email or web address]

## 2015-12-19 NOTE — Care Management Note (Addendum)
Case Management Note  Patient Details  Name: [name] MRN: [id number] Date of Birth: [birth year]  Subjective/Objective:                  Pericardial effusion Action/Plan: Discharge planning Expected Discharge Date:  12/19/15              Expected Discharge Plan:  [place]  In-House Referral:     Discharge planning Services  CM Consult  Post Acute Care Choice:  Home Health Choice offered to:  Adult Children, Patient  DME Arranged:  N/A DME Agency:  NA  HH Arranged:  PT HH Agency:  [facility]  Status of Service:  Completed, signed off  If discussed at [place] of Stay Meetings, dates discussed:    Additional Comments: CM spoke with pt's daughter, Abigail Butts [phone], as pt is in bathroom for choice.  Abigail Butts explains she take care of bedbound father and pt and chooses [facility] to render HHPT.  Referral called to [facility] rep, Jermaine.  Pt has all needed DME at home (spouse's).  NO other CM needs were communicated. Dellie Catholic, RN 12/19/2015, 1:46 PM

## 2015-12-20 LAB — PH, BODY FLUID: PH, BODY FLUID: 7.2

## 2015-12-21 LAB — CULTURE, BODY FLUID-BOTTLE: CULTURE: NO GROWTH

## 2015-12-21 LAB — CULTURE, BODY FLUID W GRAM STAIN -BOTTLE

## 2015-12-27 ENCOUNTER — Emergency Department (HOSPITAL_COMMUNITY): Payer: Medicare Other

## 2015-12-27 ENCOUNTER — Encounter (HOSPITAL_COMMUNITY): Payer: Self-pay | Admitting: Family Medicine

## 2015-12-27 ENCOUNTER — Inpatient Hospital Stay (HOSPITAL_COMMUNITY)
Admission: EM | Admit: 2015-12-27 | Discharge: 2016-01-02 | DRG: 872 | Disposition: A | Discharge status: Home / Self Care | Payer: Medicare Other | Attending: Internal Medicine | Admitting: Internal Medicine

## 2015-12-27 DIAGNOSIS — I272 Pulmonary hypertension, unspecified: Secondary | ICD-10-CM | POA: Diagnosis present

## 2015-12-27 DIAGNOSIS — I480 Paroxysmal atrial fibrillation: Secondary | ICD-10-CM | POA: Diagnosis not present

## 2015-12-27 DIAGNOSIS — R109 Unspecified abdominal pain: Secondary | ICD-10-CM

## 2015-12-27 DIAGNOSIS — E440 Moderate protein-calorie malnutrition: Secondary | ICD-10-CM | POA: Diagnosis present

## 2015-12-27 DIAGNOSIS — I5033 Acute on chronic diastolic (congestive) heart failure: Secondary | ICD-10-CM | POA: Diagnosis present

## 2015-12-27 DIAGNOSIS — R002 Palpitations: Secondary | ICD-10-CM | POA: Diagnosis present

## 2015-12-27 DIAGNOSIS — J90 Pleural effusion, not elsewhere classified: Secondary | ICD-10-CM | POA: Diagnosis present

## 2015-12-27 DIAGNOSIS — I10 Essential (primary) hypertension: Secondary | ICD-10-CM | POA: Diagnosis present

## 2015-12-27 DIAGNOSIS — Z85828 Personal history of other malignant neoplasm of skin: Secondary | ICD-10-CM | POA: Diagnosis not present

## 2015-12-27 DIAGNOSIS — D899 Disorder involving the immune mechanism, unspecified: Secondary | ICD-10-CM

## 2015-12-27 DIAGNOSIS — I159 Secondary hypertension, unspecified: Secondary | ICD-10-CM

## 2015-12-27 DIAGNOSIS — Z885 Allergy status to narcotic agent status: Secondary | ICD-10-CM | POA: Diagnosis not present

## 2015-12-27 DIAGNOSIS — I5022 Chronic systolic (congestive) heart failure: Secondary | ICD-10-CM | POA: Diagnosis present

## 2015-12-27 DIAGNOSIS — I3139 Other pericardial effusion (noninflammatory): Secondary | ICD-10-CM | POA: Diagnosis present

## 2015-12-27 DIAGNOSIS — E785 Hyperlipidemia, unspecified: Secondary | ICD-10-CM | POA: Diagnosis present

## 2015-12-27 DIAGNOSIS — W19XXXA Unspecified fall, initial encounter: Secondary | ICD-10-CM | POA: Diagnosis present

## 2015-12-27 DIAGNOSIS — E872 Acidosis: Secondary | ICD-10-CM | POA: Diagnosis present

## 2015-12-27 DIAGNOSIS — I110 Hypertensive heart disease with heart failure: Secondary | ICD-10-CM | POA: Diagnosis present

## 2015-12-27 DIAGNOSIS — I959 Hypotension, unspecified: Secondary | ICD-10-CM | POA: Diagnosis present

## 2015-12-27 DIAGNOSIS — K219 Gastro-esophageal reflux disease without esophagitis: Secondary | ICD-10-CM | POA: Diagnosis present

## 2015-12-27 DIAGNOSIS — R197 Diarrhea, unspecified: Secondary | ICD-10-CM

## 2015-12-27 DIAGNOSIS — A0472 Enterocolitis due to Clostridium difficile, not specified as recurrent: Secondary | ICD-10-CM | POA: Diagnosis present

## 2015-12-27 DIAGNOSIS — Z8249 Family history of ischemic heart disease and other diseases of the circulatory system: Secondary | ICD-10-CM | POA: Diagnosis not present

## 2015-12-27 DIAGNOSIS — R000 Tachycardia, unspecified: Secondary | ICD-10-CM | POA: Diagnosis present

## 2015-12-27 DIAGNOSIS — Z880 Allergy status to penicillin: Secondary | ICD-10-CM | POA: Diagnosis not present

## 2015-12-27 DIAGNOSIS — Z9889 Other specified postprocedural states: Secondary | ICD-10-CM | POA: Diagnosis not present

## 2015-12-27 DIAGNOSIS — Z90710 Acquired absence of both cervix and uterus: Secondary | ICD-10-CM

## 2015-12-27 DIAGNOSIS — M069 Rheumatoid arthritis, unspecified: Secondary | ICD-10-CM | POA: Diagnosis present

## 2015-12-27 DIAGNOSIS — B001 Herpesviral vesicular dermatitis: Secondary | ICD-10-CM | POA: Diagnosis present

## 2015-12-27 DIAGNOSIS — I313 Pericardial effusion (noninflammatory): Secondary | ICD-10-CM | POA: Diagnosis present

## 2015-12-27 DIAGNOSIS — Z7951 Long term (current) use of inhaled steroids: Secondary | ICD-10-CM | POA: Diagnosis not present

## 2015-12-27 DIAGNOSIS — Z888 Allergy status to other drugs, medicaments and biological substances status: Secondary | ICD-10-CM

## 2015-12-27 DIAGNOSIS — I319 Disease of pericardium, unspecified: Secondary | ICD-10-CM | POA: Diagnosis not present

## 2015-12-27 DIAGNOSIS — Z79899 Other long term (current) drug therapy: Secondary | ICD-10-CM

## 2015-12-27 DIAGNOSIS — A419 Sepsis, unspecified organism: Secondary | ICD-10-CM | POA: Diagnosis present

## 2015-12-27 DIAGNOSIS — I482 Chronic atrial fibrillation: Secondary | ICD-10-CM | POA: Diagnosis present

## 2015-12-27 DIAGNOSIS — M8580 Other specified disorders of bone density and structure, unspecified site: Secondary | ICD-10-CM | POA: Diagnosis present

## 2015-12-27 DIAGNOSIS — R9431 Abnormal electrocardiogram [ECG] [EKG]: Secondary | ICD-10-CM | POA: Diagnosis not present

## 2015-12-27 DIAGNOSIS — E876 Hypokalemia: Secondary | ICD-10-CM | POA: Diagnosis present

## 2015-12-27 DIAGNOSIS — Z6825 Body mass index (BMI) 25.0-25.9, adult: Secondary | ICD-10-CM

## 2015-12-27 DIAGNOSIS — D849 Immunodeficiency, unspecified: Secondary | ICD-10-CM

## 2015-12-27 LAB — COMPREHENSIVE METABOLIC PANEL
ALK PHOS: 76 U/L (ref 38–126)
ALT: 15 U/L (ref 14–54)
AST: 25 U/L (ref 15–41)
Albumin: 4 g/dL (ref 3.5–5.0)
Anion gap: 9 (ref 5–15)
BILIRUBIN TOTAL: 1 mg/dL (ref 0.3–1.2)
BUN: 9 mg/dL (ref 6–20)
CALCIUM: 9.1 mg/dL (ref 8.9–10.3)
CO2: 26 mmol/L (ref 22–32)
CREATININE: 0.81 mg/dL (ref 0.44–1.00)
Chloride: 99 mmol/L — ABNORMAL LOW (ref 101–111)
GFR calc Af Amer: >60 mL/min (ref >60)
Glucose, Bld: 137 mg/dL — ABNORMAL HIGH (ref 65–99)
POTASSIUM: 3.9 mmol/L (ref 3.5–5.1)
Sodium: 134 mmol/L — ABNORMAL LOW (ref 135–145)
TOTAL PROTEIN: 7.8 g/dL (ref 6.5–8.1)

## 2015-12-27 LAB — CBC WITH DIFFERENTIAL/PLATELET
Basophils Absolute: 0 10*3/uL (ref 0.0–0.1)
Basophils Relative: 0 %
EOS PCT: 0 %
Eosinophils Absolute: 0 10*3/uL (ref 0.0–0.7)
HEMATOCRIT: 43.8 % (ref 36.0–46.0)
Hemoglobin: 14.9 g/dL (ref 12.0–15.0)
LYMPHS ABS: 1.8 10*3/uL (ref 0.7–4.0)
LYMPHS PCT: 9 %
MCH: 30.5 pg (ref 26.0–34.0)
MCHC: 34 g/dL (ref 30.0–36.0)
MCV: 89.8 fL (ref 78.0–100.0)
MONO ABS: 1.6 10*3/uL — AB (ref 0.1–1.0)
MONOS PCT: 8 %
Neutro Abs: 17.1 10*3/uL — ABNORMAL HIGH (ref 1.7–7.7)
Neutrophils Relative %: 83 %
PLATELETS: 261 10*3/uL (ref 150–400)
RBC: 4.88 MIL/uL (ref 3.87–5.11)
RDW: 15.4 % (ref 11.5–15.5)
WBC: 20.6 10*3/uL — ABNORMAL HIGH (ref 4.0–10.5)

## 2015-12-27 LAB — PROTIME-INR
INR: 1.23
Prothrombin Time: 15.5 seconds — ABNORMAL HIGH (ref 11.4–15.2)

## 2015-12-27 LAB — URINALYSIS, ROUTINE W REFLEX MICROSCOPIC
Bacteria, UA: NONE SEEN
Bilirubin Urine: NEGATIVE
GLUCOSE, UA: NEGATIVE mg/dL
KETONES UR: NEGATIVE mg/dL
Leukocytes, UA: NEGATIVE
NITRITE: NEGATIVE
PH: 8 (ref 5.0–8.0)
PROTEIN: 30 mg/dL — AB
Specific Gravity, Urine: 1.006 (ref 1.005–1.030)
Squamous Epithelial / LPF: NONE SEEN

## 2015-12-27 LAB — I-STAT CG4 LACTIC ACID, ED
LACTIC ACID, VENOUS: 0.77 mmol/L (ref 0.5–1.9)
Lactic Acid, Venous: 2.07 mmol/L (ref 0.5–1.9)

## 2015-12-27 LAB — I-STAT BETA HCG BLOOD, ED (MC, WL, AP ONLY)

## 2015-12-27 LAB — I-STAT TROPONIN, ED: Troponin i, poc: 0 ng/mL (ref 0.00–0.08)

## 2015-12-27 MED ORDER — VANCOMYCIN HCL IN DEXTROSE 1-5 GM/200ML-% IV SOLN
1000.0000 mg | Freq: Once | INTRAVENOUS | Status: AC
  Administered 2015-12-27: 1000 mg via INTRAVENOUS
  Filled 2015-12-27: qty 200

## 2015-12-27 MED ORDER — ACETAMINOPHEN 325 MG PO TABS
650.0000 mg | ORAL_TABLET | Freq: Once | ORAL | Status: AC
  Administered 2015-12-27: 650 mg via ORAL
  Filled 2015-12-27: qty 2

## 2015-12-27 MED ORDER — SODIUM CHLORIDE 0.9 % IV BOLUS (SEPSIS)
1000.0000 mL | Freq: Once | INTRAVENOUS | Status: AC
  Administered 2015-12-27: 1000 mL via INTRAVENOUS

## 2015-12-27 MED ORDER — VANCOMYCIN HCL 500 MG IV SOLR
500.0000 mg | Freq: Two times a day (BID) | INTRAVENOUS | Status: DC
  Administered 2015-12-28 – 2015-12-30 (×5): 500 mg via INTRAVENOUS
  Filled 2015-12-27 (×5): qty 500

## 2015-12-27 MED ORDER — DEXTROSE 5 % IV SOLN
2.0000 g | INTRAVENOUS | Status: AC
  Administered 2015-12-27: 2 g via INTRAVENOUS
  Filled 2015-12-27: qty 2

## 2015-12-27 MED ORDER — DEXTROSE 5 % IV SOLN
1.0000 g | Freq: Three times a day (TID) | INTRAVENOUS | Status: DC
  Administered 2015-12-28 – 2015-12-30 (×8): 1 g via INTRAVENOUS
  Filled 2015-12-27 (×9): qty 1

## 2015-12-27 NOTE — ED Provider Notes (Signed)
[place] DEPT Provider Note   CSN: [id number] Arrival date & time: 12/27/15  1945  By signing my name below, I, Dora Sims, attest that this documentation has been prepared under the direction and in the presence [employer], PA-C. Electronically Signed: Dora Sims, Scribe. 12/27/2015. 9:08 PM.   History   Chief Complaint Chief Complaint  Patient presents with  . Code Sepsis    The history is provided by the patient. No language interpreter was used.     HPI Comments: LASHAWNDA BENNICK is a 75 y.o. female brought in by family, with PMHx significant for A-Fib, pericardial effusion, pulmonary HTN, HLD, and hypokalemia, who presents to the Emergency Department complaining of constant fatigue and generalized weakness beginning several days ago. She notes a fever for several days and a cough for the last week as well. Daughter reports patient has had worsening diarrhea over the last week and began acting "crazy" yesterday; she notes pt started hallucinating intermittently and acting very unusual. No medications or treatments tried at home. Patient was discharged from [facility] 8 days ago after an admission for pericardial effusion. She was not discharged with antibiotics. Pt reports she felt better for the first couple of days after her admission but suddenly started feeling fatigued and weak. Daughter notes pt's daily dosage of Metoprolol was recently adjusted and she was recently taken off of anticoagulants. She denies chest pain, SOB, myalgias, arthralgias, nausea, vomiting, or any other associated symptoms.   Past Medical History:  Diagnosis Date  . Acne rosacea   . Adhesive capsulitis of left shoulder 1980  . Allergic rhinitis   . Chronic atrial fibrillation ([place])    a. Coumadin anticoagulation - [facility] ([place])  . Chronic bronchitis ([place])   . Esophageal reflux   . Frequent epistaxis    "cause of my coumadin" (12/16/2015)  . Heart murmur   .  History of echocardiogram    a. Echo 4/17:  EF 55-60%, no RWMA, mild MR, mild LAE, PASP 48 mmHg, trivial effusion post to heart  . Hypercholesterolemia   . Hyperlipidemia   . Hypertension   . Osteopenia   . Pleural effusion associated with pulmonary infection    Admx 5/17 >> required L thoracentesis (cytology neg for malignancy)   . Pneumonia    "I've had it 4 times this year" (12/16/2015)  . Polymorphic light eruption 2004  . Prolonged QT interval   . Pulmonary HTN   . Respiratory failure ([place]) 04/2015   "spent 3 days; went home; then another 13 days in hospital" (12/16/2015  . Rheumatoid arthritis ([place]) 1975  . Right middle ear infection 1986  . Skin cancer    "burndt it off at the right side of my nose/face" (12/16/2015)    Patient Active Problem List   Diagnosis Date Noted  . Sepsis ([place]) 12/27/2015  . Pericardial effusion 12/15/2015  . GERD (gastroesophageal reflux disease) 12/15/2015  . Pulmonary hypertension 06/08/2015  . Persistent atrial fibrillation ([place])   . Long term (current) use of anticoagulants   . Long QT interval   . Chest tube in place   . Exudative pleural effusion   . HCAP (healthcare-associated pneumonia) 05/23/2015  . Acute respiratory failure with hypoxia ([place]) 05/23/2015  . Pleural effusion, left 05/23/2015  . Pleuritic chest pain 05/23/2015  . Acute respiratory failure ([place]) 05/23/2015  . Hypokalemia 04/24/2015  . Hypomagnesemia 04/24/2015  . QT prolongation 04/24/2015  . Rheumatoid arthritis ([place]) 04/24/2015  . Chest pain 08/03/2014  .  CAP (community acquired pneumonia) 08/03/2014  . Encounter for therapeutic drug monitoring 07/14/2013  . Hypertension 05/15/2013  . Hyperlipidemia 05/15/2013  . Ankle edema 05/15/2013  . Atrial fibrillation ([place]) 11/07/2012  . Long term current use of anticoagulant therapy 11/07/2012    Past Surgical History:  Procedure Laterality Date  . BREAST BIOPSY Left 2000s X 2  . CARDIAC CATHETERIZATION  03/2010    a. [place] 2/09: no ischemia; low risk  //  b. [place] 3/12: normal coronary arteries  . CARDIAC CATHETERIZATION N/A 12/16/2015   Procedure: Pericardiocentesis;  Surgeon: Nelva Bush, MD;  Location: [place] [facility];  Service: Cardiovascular;  Laterality: N/A;  . CARDIOVERSION  04/2010  . TEMPOROMANDIBULAR JOINT SURGERY Bilateral   . TOTAL ABDOMINAL HYSTERECTOMY      OB History    No data available       Home Medications    Prior to Admission medications   Medication Sig Start Date End Date Taking? Authorizing Provider  acidophilus (RISAQUAD) CAPS capsule Take 1 capsule by mouth daily.   Yes Historical Provider, MD  cholecalciferol (VITAMIN D) 1000 UNITS tablet Take 2,000 Units by mouth daily.   Yes Historical Provider, MD  colchicine 0.6 MG tablet Take 0.6 mg by mouth daily.   Yes Historical Provider, MD  etanercept (ENBREL) 25 MG injection Inject 25 mg into the skin every Friday.    Yes Historical Provider, MD  fluticasone (FLONASE) 50 MCG/ACT nasal spray Place 2 sprays into both nostrils daily as needed for rhinitis.    Yes Historical Provider, MD  folic acid (FOLVITE) 1 MG tablet Take 2 mg by mouth daily.    Yes Historical Provider, MD  furosemide (LASIX) 40 MG tablet Take 40 mg by mouth every evening.   Yes Historical Provider, MD  guaiFENesin (MUCINEX) 600 MG 12 hr tablet Take 600 mg by mouth 2 (two) times daily as needed for cough or to loosen phlegm.    Yes Historical Provider, MD  methotrexate (RHEUMATREX) 2.5 MG tablet Take 2.5-7.5 mg by mouth 2 (two) times a week. Pt takes three tablets every Tuesday and two tablets every Wednesday.   Yes Historical Provider, MD  metoprolol succinate (TOPROL-XL) 25 MG 24 hr tablet Take 3 tablets (75 mg total) by mouth 2 (two) times daily. Take with or immediately following a meal. 12/19/15  Yes Costin Karlyne Greenspan, MD  Multiple Vitamins-Minerals (MULTIVITAMIN WITH MINERALS) tablet Take 1 tablet by mouth daily.   Yes Historical Provider, MD    omeprazole (PRILOSEC) 20 MG capsule Take 20 mg by mouth daily.    Yes Historical Provider, MD  potassium chloride (K-DUR,KLOR-CON) 10 MEQ tablet Take 10 mEq by mouth daily.   Yes Historical Provider, MD  pravastatin (PRAVACHOL) 20 MG tablet Take 20 mg by mouth at bedtime.   Yes Historical Provider, MD  predniSONE (DELTASONE) 2.5 MG tablet Take 2.5 mg by mouth daily with breakfast.   Yes Historical Provider, MD  verapamil (CALAN) 40 MG tablet Take 1 tablet (40 mg total) by mouth every 12 (twelve) hours. 10/01/15  Yes Liliane Shi, PA-C    Family History Family History  Problem Relation Age of Onset  . Heart disease Father   . Heart disease Mother   . Other Mother     prolonged qtc  . Heart disease Sister   . Heart disease Sister   . Healthy Sister   . Fainting Neg Hx     Social History Social History  Substance Use Topics  .  Smoking status: Never Smoker  . Smokeless tobacco: Never Used  . Alcohol use No     Allergies   Arthrotec [diclofenac-misoprostol]; Erythromycin; Morphine sulfate; Amoxicillin; Diltiazem hcl; Gold-containing drug products; Macrodantin [nitrofurantoin macrocrystal]; Naproxen; and Penicillins   Review of Systems Review of Systems A complete 10 system review of systems was obtained and all systems are negative except as noted in the HPI and PMH.    Physical Exam Updated Vital Signs BP 96/61 (BP Location: Right Arm)   Pulse 99   Temp 98.2 F (36.8 C) (Oral)   Resp 24   Ht 5\' 3"  (1.6 m)   Wt 63.5 kg   SpO2 96%   BMI 24.80 kg/m   Physical Exam  Constitutional: She is oriented to person, place, and time. She appears well-developed and well-nourished. No distress.  Pleasant and nontoxic appearing  HENT:  Head: Normocephalic and atraumatic.  Eyes: Conjunctivae and EOM are normal. No scleral icterus.  Neck: Normal range of motion.  No nuchal rigidity or meningismus  Cardiovascular: An irregularly irregular rhythm present. Tachycardia present.    Pulmonary/Chest: Effort normal. No respiratory distress. She has no wheezes. She has no rales.  Lungs grossly clear bilaterally.  Abdominal: Soft. She exhibits no distension. There is no tenderness. There is no guarding.  Soft, nontender abdomen  Musculoskeletal: Normal range of motion.  Neurological: She is alert and oriented to person, place, and time. She exhibits normal muscle tone. Coordination normal.  A&Ox4; GCS 15. Speech is goal oriented. Patient answers questions appropriately and follows commands. She moves extremities without ataxia. No focal deficits noted.  Skin: Skin is warm and dry. No rash noted. She is not diaphoretic. No erythema. No pallor.  Psychiatric: She has a normal mood and affect. Her behavior is normal.  Nursing note and vitals reviewed.    ED Treatments / Results  Labs (all labs ordered are listed, but only abnormal results are displayed) Labs Reviewed  COMPREHENSIVE METABOLIC PANEL - Abnormal; Notable for the following:       Result Value   Sodium 134 (*)    Chloride 99 (*)    Glucose, Bld 137 (*)    All other components within normal limits  CBC WITH DIFFERENTIAL/PLATELET - Abnormal; Notable for the following:    WBC 20.6 (*)    Neutro Abs 17.1 (*)    Monocytes Absolute 1.6 (*)    All other components within normal limits  PROTIME-INR - Abnormal; Notable for the following:    Prothrombin Time 15.5 (*)    All other components within normal limits  I-STAT CG4 LACTIC ACID, ED - Abnormal; Notable for the following:    Lactic Acid, Venous 2.07 (*)    All other components within normal limits  CULTURE, BLOOD (ROUTINE X 2)  CULTURE, BLOOD (ROUTINE X 2)  URINE CULTURE  URINALYSIS, ROUTINE W REFLEX MICROSCOPIC  I-STAT BETA HCG BLOOD, ED ([facility], [facility], [facility] ONLY)  I-STAT TROPOININ, ED  I-STAT CG4 LACTIC ACID, ED    EKG  EKG Interpretation  Date/Time:  Monday December 27 2015 20:50:41 EST Ventricular Rate:  117 PR Interval:    QRS Duration: 45 QT  Interval:  408 QTC Calculation: 570 R Axis:   6 Text Interpretation:  Atrial fibrillation with RVR Borderline T abnormalities, diffuse leads Prolonged QT interval similar to Nov 2017 Confirmed by Regenia Skeeter MD, [place] [phone]) on 12/27/2015 9:09:43 PM       Radiology Dg Chest 2 View  Result Date: 12/27/2015 CLINICAL DATA:  Fever  shin EXAM: CHEST  2 VIEW COMPARISON:  12/16/2015 FINDINGS: Cardiac shadow remains enlarged. The lungs are well aerated bilaterally. No focal infiltrate or sizable effusion is seen. Previously seen pericardial catheter has been removed. Degenerative change of the thoracic spine is noted. IMPRESSION: No active cardiopulmonary disease. Electronically Signed   By: Inez Catalina M.D.   On: 12/27/2015 21:23    Procedures Procedures (including critical care time)  DIAGNOSTIC STUDIES: Oxygen Saturation is 93% on RA, adequate by my interpretation.    COORDINATION OF CARE: 9:16 PM  Discussed treatment plan with pt and her family members at bedside and they agreed to plan.  11:30 PM Sepsis - Repeat Assessment  Performed at:    2330  Vitals     Blood pressure 96/61, pulse 99, temperature 98.5 F (36.9 C), temperature source Rectal, resp. rate 24, height 5\' 3"  (1.6 m), weight 63.5 kg, SpO2 96 %.  Heart:     rate controlled A fib  Lungs:    CTA  Capillary Refill:   <2 sec  Peripheral Pulse:   Radial pulse palpable  Skin:     Normal Color    Medications Ordered in ED Medications  ceFEPIme (MAXIPIME) 1 g in dextrose 5 % 50 mL IVPB (not administered)  vancomycin (VANCOCIN) 500 mg in sodium chloride 0.9 % 100 mL IVPB (not administered)  acetaminophen (TYLENOL) tablet 650 mg (650 mg Oral Given 12/27/15 2115)  sodium chloride 0.9 % bolus 1,000 mL (0 mLs Intravenous Stopped 12/27/15 2201)  vancomycin (VANCOCIN) IVPB 1000 mg/200 mL premix (0 mg Intravenous Stopped 12/27/15 2310)  ceFEPIme (MAXIPIME) 2 g in dextrose 5 % 50 mL IVPB (0 g Intravenous Stopped 12/27/15  2201)  sodium chloride 0.9 % bolus 1,000 mL (0 mLs Intravenous Stopped 12/27/15 2310)     Initial Impression / Assessment and Plan / ED Course  I have reviewed the triage vital signs and the nursing notes.  Pertinent labs & imaging results that were available during my care of the patient were reviewed by me and considered in my medical decision making (see chart for details).  Clinical Course     75 year old female presents to the emergency department for fever and fatigue. Family reporting some episodes of hallucinations and disorientation x2 days which are transient. These are not present when evaluating the patient today. Rectal temperature of 103.66F has improved with Tylenol. This has also improved the patient's A. fib with RVR. Patient was previously on blood thinners for atrial fibrillation. This was discontinued during her recent admission. She was discharged one week ago after admission for pericardial effusion at which time she underwent pericardiocentesis. Unable to exclude whether sepsis today is secondary to recent instrumentation. Troponin reassuring. No evidence of pneumonia on chest x-ray. Patient has been broadly covered with vancomycin and cefepime. Case discussed with Dr. Blaine Hamper of [facility] who will admit for further management.   Vitals:   12/27/15 2224 12/27/15 2230 12/27/15 2300 12/27/15 2313  BP:  96/61 96/61 96/61   Pulse: 111 (!) 132 96 99  Resp: 24 (!) 27 22 24   Temp: 101 F (38.3 C)   98.2 F (36.8 C)  TempSrc: Oral   Oral  SpO2: 97% 97% 95% 96%  Weight:      Height:         Final Clinical Impressions(s) / ED Diagnoses   Final diagnoses:  Sepsis, due to unspecified organism [facility])    New Prescriptions New Prescriptions   No medications on file    I  personally performed the services described in this documentation, which was scribed in my presence. The recorded information has been reviewed and is accurate.      Antonietta Breach, PA-C 12/27/15 2332      Sherwood Gambler, MD 12/28/15 1250

## 2015-12-27 NOTE — Progress Notes (Signed)
Pharmacy Antibiotic Note  [name] is a 75 y.o. female with PMH of a-fib (warfarin currently on hold), RA, HTN, HLD, recent pericardial effusion s/p pericardiocentesis, recent PNA, admitted on 12/27/2015 with sepsis.  Patient presents with weakness, fever, cough, diarrhea, and hallucinations. Pharmacy has been consulted for Vancomycin and Cefepime dosing. Noted penicillin allergy, but has tolerated multiple cephalosporins in the past.   Plan: Vancomycin 1g IV x 1 ordered in the ED. Continue with Vancomycin 500mg  IV q12h.  Plan for Vancomycin trough level at steady state. Goal trough level 15-20 mcg/mL.  Cefepime 2g IV x 1, then 1g IV q8h. Monitor renal function, cultures, clinical course.   Height: 5\' 3"  (160 cm) Weight: 140 lb (63.5 kg) IBW/kg (Calculated) : 52.4  Temp (24hrs), Avg:102.4 F (39.1 C), Min:101 F (38.3 C), Max:103.5 F (39.7 C)   Recent Labs Lab 12/27/15 2025 12/27/15 2038  WBC 20.6*  --   CREATININE 0.81  --   LATICACIDVEN  --  2.07*    Estimated Creatinine Clearance: 54.6 mL/min (by C-G formula based on SCr of 0.81 mg/dL).    Allergies  Allergen Reactions  . Arthrotec [Diclofenac-Misoprostol] Diarrhea  . Erythromycin Anaphylaxis       . Morphine Sulfate Other (See Comments)    Reaction:  Hallucinations   . Amoxicillin Rash and Other (See Comments)    Has patient had a PCN reaction causing immediate rash, facial/tongue/throat swelling, SOB or lightheadedness with hypotension: Yes Has patient had a PCN reaction causing severe rash involving mucus membranes or skin necrosis: Yes Has patient had a PCN reaction that required hospitalization No Has patient had a PCN reaction occurring within the last 10 years: No If all of the above answers are "NO", then may proceed with Cephalosporin use.  . Diltiazem Hcl Swelling, Rash and Other (See Comments)    Pt is able to tolerate Verapamil.    . Gold-Containing Drug Products Rash  . Macrodantin  [Nitrofurantoin Macrocrystal] Rash  . Naproxen Rash  . Penicillins Rash and Other (See Comments)    Has patient had a PCN reaction causing immediate rash, facial/tongue/throat swelling, SOB or lightheadedness with hypotension: Yes Has patient had a PCN reaction causing severe rash involving mucus membranes or skin necrosis: Yes Has patient had a PCN reaction that required hospitalization No Has patient had a PCN reaction occurring within the last 10 years: No If all of the above answers are "NO", then may proceed with Cephalosporin use.     Antimicrobials this admission: 12/11 >> Vancomycin >> 12/11 >> Cefepime >>  Dose adjustments this admission: --  Microbiology results: 12/11 BCx: sent 12/11 UCx: sent    Thank you for allowing pharmacy to be a part of this patient's care.   Lindell Spar, PharmD, BCPS Pager: [phone] 12/27/2015 9:29 PM

## 2015-12-27 NOTE — ED Notes (Signed)
Blood cultures obtained in right forearm.

## 2015-12-27 NOTE — H&P (Signed)
History and Physical    [name] [id number] DOB: 09-07-1940 DOA: 12/27/2015  Referring MD/NP/PA:   PCP: Leeroy Cha, MD   Patient coming from:  The patient is coming from home.  At baseline, pt is partiially dependent for most of ADL.   Chief Complaint: fever, diarrhea  HPI: [name] is a 75 y.o. female with medical history significant of hypertension, hyperlipidemia, GERD, rheumatoid arthritis, pulmonary hypertension, atrial fibrillation, who presents with fever and diarrhea.  Patient was recently hospitalized from 11/29-12/3 due to pericardial effusion. Pt underwent pericardiocentesis and pericardial drain placement with removal of 500 mL of dark sanguinous fluid. Fluid was with inflammatory characteristics. She underwent repeated echocardiograms without significant fluid accumulation before discharge.   Pt states that she developed fever and diarrhea in the past 2 days. She had 3 bowel movements with watery stool yesterday. Has nausea and  vomiting x 2. No abdominal pain. She has mild cough, but no chest pain or shortness of breath. Patient states that she has generalized weakness, and had a fall on Friday, injured her frontal area. Denies unilateral weakness, numbness or tingling sensations extremities. No vision change or hearing loss. No symptoms of UTI. She states that she has itchy rashes in frontal chest and abdominal wall.  ED Course: pt was found to have hypotension with blood pressure down to 85/67, which responded to IV fluid, improved to 92/57, lactate 2.07, INR 1.23, WBC 20.6, negative troponin, electrolytes renal function okay, negative CT head, negative chest x-ray, temperature 1 was 3.5, tachycardia, tachypnea, oxygen saturation 93-95 percent on room air. Patient is admitted to stepdown as inpatient.  Review of Systems:   General: has fevers, chills, no changes in body weight, has fatigue HEENT: no blurry vision, hearing changes or sore  throat Respiratory: no dyspnea, has mild coughing, no wheezing CV: no chest pain, no palpitations GI: has nausea, vomiting, diarrhea, no constipation, abdominal pain GU: no dysuria, burning on urination, increased urinary frequency, hematuria  Ext: no leg edema Neuro: no unilateral weakness, numbness, or tingling, no vision change or hearing loss Skin: has rashes in chest and abdomen MSK: No muscle spasm, no deformity, no limitation of range of movement in spin Heme: No easy bruising.  Travel history: No recent long distant travel.  Allergy:  Allergies  Allergen Reactions  . Arthrotec [Diclofenac-Misoprostol] Diarrhea  . Erythromycin Anaphylaxis       . Morphine Sulfate Other (See Comments)    Reaction:  Hallucinations   . Amoxicillin Rash and Other (See Comments)    Has patient had a PCN reaction causing immediate rash, facial/tongue/throat swelling, SOB or lightheadedness with hypotension: Yes Has patient had a PCN reaction causing severe rash involving mucus membranes or skin necrosis: Yes Has patient had a PCN reaction that required hospitalization No Has patient had a PCN reaction occurring within the last 10 years: No If all of the above answers are "NO", then may proceed with Cephalosporin use.  . Diltiazem Hcl Swelling, Rash and Other (See Comments)    Pt is able to tolerate Verapamil.    . Gold-Containing Drug Products Rash  . Macrodantin [Nitrofurantoin Macrocrystal] Rash  . Naproxen Rash  . Penicillins Rash and Other (See Comments)    Has patient had a PCN reaction causing immediate rash, facial/tongue/throat swelling, SOB or lightheadedness with hypotension: Yes Has patient had a PCN reaction causing severe rash involving mucus membranes or skin necrosis: Yes Has patient had a PCN reaction that required hospitalization No Has patient had  a PCN reaction occurring within the last 10 years: No If all of the above answers are "NO", then may proceed with Cephalosporin  use.     Past Medical History:  Diagnosis Date  . Acne rosacea   . Adhesive capsulitis of left shoulder 1980  . Allergic rhinitis   . Chronic atrial fibrillation ([place])    a. Coumadin anticoagulation - [facility] ([place])  . Chronic bronchitis ([place])   . Esophageal reflux   . Frequent epistaxis    "cause of my coumadin" (12/16/2015)  . Heart murmur   . History of echocardiogram    a. Echo 4/17:  EF 55-60%, no RWMA, mild MR, mild LAE, PASP 48 mmHg, trivial effusion post to heart  . Hypercholesterolemia   . Hyperlipidemia   . Hypertension   . Osteopenia   . Pleural effusion associated with pulmonary infection    Admx 5/17 >> required L thoracentesis (cytology neg for malignancy)   . Pneumonia    "I've had it 4 times this year" (12/16/2015)  . Polymorphic light eruption 2004  . Prolonged QT interval   . Pulmonary HTN   . Respiratory failure ([place]) 04/2015   "spent 3 days; went home; then another 13 days in hospital" (12/16/2015  . Rheumatoid arthritis ([place]) 1975  . Right middle ear infection 1986  . Skin cancer    "burndt it off at the right side of my nose/face" (12/16/2015)    Past Surgical History:  Procedure Laterality Date  . BREAST BIOPSY Left 2000s X 2  . CARDIAC CATHETERIZATION  03/2010   a. Myoview 2/09: no ischemia; low risk  //  b. [place] 3/12: normal coronary arteries  . CARDIAC CATHETERIZATION N/A 12/16/2015   Procedure: Pericardiocentesis;  Surgeon: Nelva Bush, MD;  Location: [facility];  Service: Cardiovascular;  Laterality: N/A;  . CARDIOVERSION  04/2010  . TEMPOROMANDIBULAR JOINT SURGERY Bilateral   . TOTAL ABDOMINAL HYSTERECTOMY      Social History:  reports that she has never smoked. She has never used smokeless tobacco. She reports that she does not drink alcohol or use drugs.  Family History:  Family History  Problem Relation Age of Onset  . Heart disease Father   . Heart disease Mother   . Other Mother     prolonged qtc  .  Heart disease Sister   . Heart disease Sister   . Healthy Sister   . Fainting Neg Hx      Prior to Admission medications   Medication Sig Start Date End Date Taking? Authorizing Provider  acidophilus (RISAQUAD) CAPS capsule Take 1 capsule by mouth daily.   Yes Historical Provider, MD  cholecalciferol (VITAMIN D) 1000 UNITS tablet Take 2,000 Units by mouth daily.   Yes Historical Provider, MD  colchicine 0.6 MG tablet Take 0.6 mg by mouth daily.   Yes Historical Provider, MD  etanercept (ENBREL) 25 MG injection Inject 25 mg into the skin every Friday.    Yes Historical Provider, MD  fluticasone (FLONASE) 50 MCG/ACT nasal spray Place 2 sprays into both nostrils daily as needed for rhinitis.    Yes Historical Provider, MD  folic acid (FOLVITE) 1 MG tablet Take 2 mg by mouth daily.    Yes Historical Provider, MD  furosemide (LASIX) 40 MG tablet Take 40 mg by mouth every evening.   Yes Historical Provider, MD  guaiFENesin (MUCINEX) 600 MG 12 hr tablet Take 600 mg by mouth 2 (two) times daily as needed for cough or  to loosen phlegm.    Yes Historical Provider, MD  methotrexate (RHEUMATREX) 2.5 MG tablet Take 2.5-7.5 mg by mouth 2 (two) times a week. Pt takes three tablets every Tuesday and two tablets every Wednesday.   Yes Historical Provider, MD  metoprolol succinate (TOPROL-XL) 25 MG 24 hr tablet Take 3 tablets (75 mg total) by mouth 2 (two) times daily. Take with or immediately following a meal. 12/19/15  Yes Costin Karlyne Greenspan, MD  Multiple Vitamins-Minerals (MULTIVITAMIN WITH MINERALS) tablet Take 1 tablet by mouth daily.   Yes Historical Provider, MD  omeprazole (PRILOSEC) 20 MG capsule Take 20 mg by mouth daily.    Yes Historical Provider, MD  potassium chloride (K-DUR,KLOR-CON) 10 MEQ tablet Take 10 mEq by mouth daily.   Yes Historical Provider, MD  pravastatin (PRAVACHOL) 20 MG tablet Take 20 mg by mouth at bedtime.   Yes Historical Provider, MD  predniSONE (DELTASONE) 2.5 MG tablet Take 2.5  mg by mouth daily with breakfast.   Yes Historical Provider, MD  verapamil (CALAN) 40 MG tablet Take 1 tablet (40 mg total) by mouth every 12 (twelve) hours. 10/01/15  Yes Liliane Shi, PA-C    Physical Exam: Vitals:   12/28/15 0330 12/28/15 0357 12/28/15 0400 12/28/15 0432  BP: 105/83  90/62 91/76  Pulse: 107  95 103  Resp: 23  25 21   Temp:  98.2 F (36.8 C)    TempSrc:  Oral    SpO2: 100%  95% 95%  Weight:      Height:       General: Not in acute distress HEENT:       Eyes: PERRL, EOMI, no scleral icterus.       ENT: No discharge from the ears and nose, no pharynx injection, no tonsillar enlargement.        Neck: No JVD, no bruit, no mass felt. Heme: No neck lymph node enlargement. Cardiac: S1/S2, RRR, No murmurs, No gallops or rubs. Respiratory: No rales, wheezing, rhonchi or rubs. GI: Soft, nondistended, nontender, no rebound pain, no organomegaly, BS present. GU: No hematuria Ext: No pitting leg edema bilaterally. 2+DP/PT pulse bilaterally. Musculoskeletal: No joint deformities, No joint redness or warmth, no limitation of ROM in spin. Skin: has rashes in chest and abdomen Neuro: Alert, oriented X3, cranial nerves II-XII grossly intact, moves all extremities normally.  Psych: Patient is not psychotic, no suicidal or hemocidal ideation.  Labs on Admission: I have personally reviewed following labs and imaging studies  CBC:  Recent Labs Lab 12/27/15 2025  WBC 20.6*  NEUTROABS 17.1*  HGB 14.9  HCT 43.8  MCV 89.8  PLT 0000000   Basic Metabolic Panel:  Recent Labs Lab 12/27/15 2025  NA 134*  K 3.9  CL 99*  CO2 26  GLUCOSE 137*  BUN 9  CREATININE 0.81  CALCIUM 9.1   GFR: Estimated Creatinine Clearance: 54.6 mL/min (by C-G formula based on SCr of 0.81 mg/dL). Liver Function Tests:  Recent Labs Lab 12/27/15 2025  AST 25  ALT 15  ALKPHOS 76  BILITOT 1.0  PROT 7.8  ALBUMIN 4.0   No results for input(s): LIPASE, AMYLASE in the last 168 hours. No  results for input(s): AMMONIA in the last 168 hours. Coagulation Profile:  Recent Labs Lab 12/27/15 2025  INR 1.23   Cardiac Enzymes: No results for input(s): CKTOTAL, CKMB, CKMBINDEX, TROPONINI in the last 168 hours. BNP (last 3 results)  Recent Labs  12/13/15 1704  PROBNP 215.0*   HbA1C: No  results for input(s): HGBA1C in the last 72 hours. CBG: No results for input(s): GLUCAP in the last 168 hours. Lipid Profile: No results for input(s): CHOL, HDL, LDLCALC, TRIG, CHOLHDL, LDLDIRECT in the last 72 hours. Thyroid Function Tests: No results for input(s): TSH, T4TOTAL, FREET4, T3FREE, THYROIDAB in the last 72 hours. Anemia Panel: No results for input(s): VITAMINB12, FOLATE, FERRITIN, TIBC, IRON, RETICCTPCT in the last 72 hours. Urine analysis:    Component Value Date/Time   COLORURINE YELLOW 12/27/2015 2323   APPEARANCEUR CLEAR 12/27/2015 2323   LABSPEC 1.006 12/27/2015 2323   PHURINE 8.0 12/27/2015 2323   GLUCOSEU NEGATIVE 12/27/2015 2323   HGBUR SMALL (A) 12/27/2015 2323   BILIRUBINUR NEGATIVE 12/27/2015 2323   KETONESUR NEGATIVE 12/27/2015 2323   PROTEINUR 30 (A) 12/27/2015 2323   UROBILINOGEN 0.2 10/29/2013 1932   NITRITE NEGATIVE 12/27/2015 2323   LEUKOCYTESUR NEGATIVE 12/27/2015 2323   Sepsis Labs: @LABRCNTIP (procalcitonin:4,lacticidven:4) ) Recent Results (from the past 240 hour(s))  Culture, blood (Routine x 2)     Status: None (Preliminary result)   Collection Time: 12/27/15  8:26 PM  Result Value Ref Range Status   Specimen Description   Final    LEFT ANTECUBITAL BLOOD Performed at [facility]  Final   Culture PENDING  Incomplete   Report Status PENDING  Incomplete     Radiological Exams on Admission: Dg Chest 2 View  Result Date: 12/27/2015 CLINICAL DATA:  Fever shin EXAM: CHEST  2 VIEW COMPARISON:  12/16/2015 FINDINGS: Cardiac shadow remains enlarged. The lungs are well  aerated bilaterally. No focal infiltrate or sizable effusion is seen. Previously seen pericardial catheter has been removed. Degenerative change of the thoracic spine is noted. IMPRESSION: No active cardiopulmonary disease. Electronically Signed   By: Inez Catalina M.D.   On: 12/27/2015 21:23   Ct Head Wo Contrast  Result Date: 12/28/2015 CLINICAL DATA:  75 year old female with fall.  Generalized weakness. EXAM: CT HEAD WITHOUT CONTRAST TECHNIQUE: Contiguous axial images were obtained from the base of the skull through the vertex without intravenous contrast. COMPARISON:  Head CT dated 05/23/2015 FINDINGS: Brain: The ventricles and sulci are appropriate in size for patient's age. Mild periventricular and deep white matter chronic microvascular ischemic changes noted. There is no acute intracranial hemorrhage. No mass effect or midline shift noted. Vascular: No hyperdense vessel or unexpected calcification. Skull: Normal. Negative for fracture or focal lesion. Sinuses/Orbits: No acute finding. Other: None IMPRESSION: No acute intracranial pathology. Mild age-related atrophy and chronic microvascular ischemic changes. Electronically Signed   By: Anner Crete M.D.   On: 12/28/2015 02:04     EKG: Independently reviewed.  Atrial fibrillation, QTC 570, low voltage.    Assessment/Plan Principal Problem:   Sepsis ([place]) Active Problems:   Atrial fibrillation ([facility])   Hypertension   Hyperlipidemia   Rheumatoid arthritis ([facility])   Exudative pleural effusion   Long QT interval   Pericardial effusion   GERD (gastroesophageal reflux disease)   Diarrhea   Fall   Chronic systolic CHF (congestive heart failure) ([place])   Sepsis ([place]): Patient is septic with elevated lactate, hypotension, leukocytosis, fever. Etiology is not clear. Chest x-ray is negative. Patient has mild cough, but no chest pain or shortness of breath, less likely to have pneumonia. She states that she nausea, vomiting and diarrhea the  past 2 days. It is likely due to viral infection, but cannot completely rule out other possibilities, such as C.  difficile colitis. Her blood pressure responded to IV fluid treatment.  -will admit to [facility] as inpt. -IV Vanco and cefepime were started in ED, will continue now. -will give one dose of flagyl -check C. difficile PCR and history of present illness pathogen panel -Follow-up blood culture, urine culture and urinalysis -will get Procalcitonin and trend lactic acid levels per sepsis protocol. -IVF: 3L of NS bolus in ED, followed by 100 cc/h   Diarrhea:  -see above  Pericardial effusion: s/p of pericardiocentesis. She underwent repeated echocardiograms without significant fluid accumulation before discharge.  -continue colchicine  Atrial Fibrillation: CHA2DS2-VASc Score is 4, needs oral anticoagulation. Patient was Coumadin, which on hold after recent pericardiocentesis. -Continue metoprolol and verapamil (put hold ing parameter)  Rheumatoid arthritis: On prednisone 2.5 mg daily, methotrexate and Etenercept. Clinically stable. -Continue methotrexate -hold prednisone-->give stress dose Solu-Cortef for 50 mg every 6 hours -Hold Etenercept -Check cortisol level  HLD: Last LDL was 79 on 05/21/13  -Continue home medications: Pravastatin   GERD: -hold PPI to pepcid IV until C diff pcr negative  HTN: -continue metoprolol, Verapamil, and Lasix  Rashes: unclear etiology -prn hydroxyzine   Fall: CT-head negative -pt/or  Chronic systolic congestive heart failure: 2d echo on 12/17/15 showed EF of 45-50 percent. Patient does not have leg edema or JVD presented with compensated. -check BNP -hold lasix due to sepsis -watch valum status closely while receiving IVF     ED Sepsis - Assessment   Performed at:    4:43 AM   Last Vitals:    Blood pressure 96/64, pulse 100, temperature 98.2 F (36.8 C), temperature source Oral, resp. rate 24, height 5\' 3"  (1.6 m), weight 63.5 kg  (140 lb), SpO2 95 %.  Heart:      Regular rhythm Tachchycarida  Lungs:     Clear to auscultation  Capillary Refill:   >2 >2 seconds    Peripheral Pulse (include location): Brachial pulse palpable   Skin (include color):              Pale  Normal     DVT ppx: SQ Lovenox Code Status: Full code Family Communication: None at bed side.  Disposition Plan:  Anticipate discharge back to previous home environment Consults called:   Admission status: Obs / tele  Date of Service 12/28/2015    Adams Hinch, [facility] Pager [phone]  If 7PM-7AM, please contact night-coverage [email or web address] Password [facility] 12/28/2015, 4:43 AM

## 2015-12-27 NOTE — ED Triage Notes (Signed)
Patient was discharged from [facility] last Sunday for admission of PNA and pericarditis. On Friday, she had a witnessed fall with hitting her head. Family denies any LOC. On Sunday, she developed hallucinations and becoming unsteady with her gait. Also, has diarrhea that started on Tuesday. Pt reports having a rash on chest and abd that was seen by PCP on Friday.

## 2015-12-27 NOTE — ED Notes (Signed)
Hospitalist at bedside to evaluate pt.

## 2015-12-28 ENCOUNTER — Inpatient Hospital Stay (HOSPITAL_COMMUNITY): Payer: Medicare Other

## 2015-12-28 DIAGNOSIS — R197 Diarrhea, unspecified: Secondary | ICD-10-CM | POA: Diagnosis present

## 2015-12-28 DIAGNOSIS — I5033 Acute on chronic diastolic (congestive) heart failure: Secondary | ICD-10-CM | POA: Diagnosis present

## 2015-12-28 DIAGNOSIS — W19XXXA Unspecified fall, initial encounter: Secondary | ICD-10-CM | POA: Diagnosis present

## 2015-12-28 LAB — GASTROINTESTINAL PANEL BY PCR, STOOL (REPLACES STOOL CULTURE)
ADENOVIRUS F40/41: NOT DETECTED
Astrovirus: NOT DETECTED
CAMPYLOBACTER SPECIES: NOT DETECTED
CRYPTOSPORIDIUM: NOT DETECTED
Cyclospora cayetanensis: NOT DETECTED
ENTEROAGGREGATIVE E COLI (EAEC): NOT DETECTED
ENTEROPATHOGENIC E COLI (EPEC): NOT DETECTED
Entamoeba histolytica: NOT DETECTED
Enterotoxigenic E coli (ETEC): NOT DETECTED
GIARDIA LAMBLIA: NOT DETECTED
NOROVIRUS GI/GII: NOT DETECTED
PLESIMONAS SHIGELLOIDES: NOT DETECTED
ROTAVIRUS A: NOT DETECTED
SHIGELLA/ENTEROINVASIVE E COLI (EIEC): NOT DETECTED
Salmonella species: NOT DETECTED
Sapovirus (I, II, IV, and V): NOT DETECTED
Shiga like toxin producing E coli (STEC): NOT DETECTED
Vibrio cholerae: NOT DETECTED
Vibrio species: NOT DETECTED
Yersinia enterocolitica: NOT DETECTED

## 2015-12-28 LAB — CBC
HCT: 36.5 % (ref 36.0–46.0)
HEMOGLOBIN: 11.8 g/dL — AB (ref 12.0–15.0)
MCH: 30.3 pg (ref 26.0–34.0)
MCHC: 32.3 g/dL (ref 30.0–36.0)
MCV: 93.8 fL (ref 78.0–100.0)
PLATELETS: 188 10*3/uL (ref 150–400)
RBC: 3.89 MIL/uL (ref 3.87–5.11)
RDW: 15.9 % — ABNORMAL HIGH (ref 11.5–15.5)
WBC: 11.4 10*3/uL — AB (ref 4.0–10.5)

## 2015-12-28 LAB — CORTISOL-AM, BLOOD: CORTISOL - AM: 48.3 ug/dL — AB (ref 6.7–22.6)

## 2015-12-28 LAB — BASIC METABOLIC PANEL
ANION GAP: 5 (ref 5–15)
BUN: 9 mg/dL (ref 6–20)
CALCIUM: 8 mg/dL — AB (ref 8.9–10.3)
CO2: 23 mmol/L (ref 22–32)
CREATININE: 0.61 mg/dL (ref 0.44–1.00)
Chloride: 112 mmol/L — ABNORMAL HIGH (ref 101–111)
Glucose, Bld: 118 mg/dL — ABNORMAL HIGH (ref 65–99)
Potassium: 3.6 mmol/L (ref 3.5–5.1)
SODIUM: 140 mmol/L (ref 135–145)

## 2015-12-28 LAB — C DIFFICILE QUICK SCREEN W PCR REFLEX
C DIFFICILE (CDIFF) TOXIN: NEGATIVE
C Diff antigen: POSITIVE — AB

## 2015-12-28 LAB — CBG MONITORING, ED: GLUCOSE-CAPILLARY: 111 mg/dL — AB (ref 65–99)

## 2015-12-28 LAB — CLOSTRIDIUM DIFFICILE BY PCR: CDIFFPCR: NEGATIVE

## 2015-12-28 LAB — BRAIN NATRIURETIC PEPTIDE: B Natriuretic Peptide: 283.3 pg/mL — ABNORMAL HIGH (ref 0.0–100.0)

## 2015-12-28 MED ORDER — GUAIFENESIN ER 600 MG PO TB12
600.0000 mg | ORAL_TABLET | Freq: Two times a day (BID) | ORAL | Status: DC | PRN
Start: 2015-12-28 — End: 2016-01-02

## 2015-12-28 MED ORDER — METHOTREXATE 2.5 MG PO TABS: 2.5000 mg | ORAL_TABLET | ORAL | Status: DC

## 2015-12-28 MED ORDER — METRONIDAZOLE 500 MG PO TABS
500.0000 mg | ORAL_TABLET | Freq: Three times a day (TID) | ORAL | Status: DC
  Administered 2015-12-28 – 2016-01-02 (×12): 500 mg via ORAL
  Filled 2015-12-28 (×12): qty 1

## 2015-12-28 MED ORDER — VERAPAMIL HCL 40 MG PO TABS
40.0000 mg | ORAL_TABLET | Freq: Two times a day (BID) | ORAL | Status: DC
Start: 2015-12-28 — End: 2015-12-31
  Administered 2015-12-28 – 2015-12-31 (×6): 40 mg via ORAL
  Filled 2015-12-28 (×11): qty 1

## 2015-12-28 MED ORDER — FOLIC ACID 1 MG PO TABS
2.0000 mg | ORAL_TABLET | Freq: Every day | ORAL | Status: DC
Start: 2015-12-28 — End: 2016-01-02
  Administered 2015-12-28 – 2016-01-02 (×6): 2 mg via ORAL
  Filled 2015-12-28 (×7): qty 2

## 2015-12-28 MED ORDER — METOPROLOL SUCCINATE ER 25 MG PO TB24
75.0000 mg | ORAL_TABLET | Freq: Two times a day (BID) | ORAL | Status: DC
  Administered 2015-12-28: 75 mg via ORAL
  Administered 2015-12-29: 25 mg via ORAL
  Administered 2015-12-29: 75 mg via ORAL
  Filled 2015-12-28: qty 3
  Filled 2015-12-28: qty 1
  Filled 2015-12-28 (×2): qty 3

## 2015-12-28 MED ORDER — HYDROXYZINE HCL 50 MG/ML IM SOLN
25.0000 mg | Freq: Four times a day (QID) | INTRAMUSCULAR | Status: DC | PRN
  Filled 2015-12-28: qty 1

## 2015-12-28 MED ORDER — COLCHICINE 0.6 MG PO TABS
0.6000 mg | ORAL_TABLET | Freq: Every day | ORAL | Status: DC
  Filled 2015-12-28 (×3): qty 1

## 2015-12-28 MED ORDER — ENOXAPARIN SODIUM 40 MG/0.4ML ~~LOC~~ SOLN
40.0000 mg | SUBCUTANEOUS | Status: DC
  Administered 2015-12-28 – 2016-01-02 (×6): 40 mg via SUBCUTANEOUS
  Filled 2015-12-28 (×7): qty 0.4

## 2015-12-28 MED ORDER — SODIUM CHLORIDE 0.9 % IV SOLN
1000.0000 mL | INTRAVENOUS | Status: DC
  Administered 2015-12-28 – 2015-12-29 (×3): 1000 mL via INTRAVENOUS

## 2015-12-28 MED ORDER — SODIUM CHLORIDE 0.9 % IV SOLN
1000.0000 mL | Freq: Once | INTRAVENOUS | Status: AC
  Administered 2015-12-28: 1000 mL via INTRAVENOUS

## 2015-12-28 MED ORDER — PRAVASTATIN SODIUM 20 MG PO TABS
20.0000 mg | ORAL_TABLET | Freq: Every day | ORAL | Status: DC
  Administered 2015-12-28 – 2016-01-01 (×6): 20 mg via ORAL
  Filled 2015-12-28 (×6): qty 1

## 2015-12-28 MED ORDER — ACETAMINOPHEN 325 MG PO TABS
650.0000 mg | ORAL_TABLET | Freq: Four times a day (QID) | ORAL | Status: DC | PRN
  Administered 2015-12-28 – 2016-01-01 (×3): 650 mg via ORAL
  Filled 2015-12-28 (×3): qty 2

## 2015-12-28 MED ORDER — ADULT MULTIVITAMIN W/MINERALS CH
1.0000 | ORAL_TABLET | Freq: Every day | ORAL | Status: DC
  Administered 2015-12-28 – 2016-01-02 (×6): 1 via ORAL
  Filled 2015-12-28 (×8): qty 1

## 2015-12-28 MED ORDER — FLUTICASONE PROPIONATE 50 MCG/ACT NA SUSP
2.0000 | Freq: Every day | NASAL | Status: DC | PRN
  Filled 2015-12-28: qty 16

## 2015-12-28 MED ORDER — VITAMIN D3 25 MCG (1000 UNIT) PO TABS
2000.0000 [IU] | ORAL_TABLET | Freq: Every day | ORAL | Status: DC
  Administered 2015-12-28 – 2016-01-02 (×6): 2000 [IU] via ORAL
  Filled 2015-12-28 (×7): qty 2

## 2015-12-28 MED ORDER — SODIUM CHLORIDE 0.9% FLUSH
3.0000 mL | Freq: Two times a day (BID) | INTRAVENOUS | Status: DC
  Administered 2015-12-28 – 2016-01-01 (×7): 3 mL via INTRAVENOUS

## 2015-12-28 MED ORDER — HYDROCORTISONE NA SUCCINATE PF 100 MG IJ SOLR
50.0000 mg | Freq: Four times a day (QID) | INTRAMUSCULAR | Status: DC
  Administered 2015-12-28 (×2): 50 mg via INTRAVENOUS
  Filled 2015-12-28 (×3): qty 2

## 2015-12-28 MED ORDER — HYDROCORTISONE NA SUCCINATE PF 100 MG IJ SOLR
25.0000 mg | Freq: Four times a day (QID) | INTRAMUSCULAR | Status: DC
  Administered 2015-12-28 – 2015-12-30 (×6): 25 mg via INTRAVENOUS
  Filled 2015-12-28 (×6): qty 2

## 2015-12-28 MED ORDER — METRONIDAZOLE 500 MG PO TABS
500.0000 mg | ORAL_TABLET | Freq: Once | ORAL | Status: AC
  Administered 2015-12-28: 500 mg via ORAL
  Filled 2015-12-28: qty 1

## 2015-12-28 NOTE — ED Notes (Signed)
PT went to restroom info me she was not made aware of stool sample

## 2015-12-28 NOTE — Progress Notes (Signed)
PROGRESS NOTE    [name]  [id number] DOB: [birth year] DOA: 12/27/2015 PCP: Leeroy Cha, MD    Brief Narrative:[name] is a 75 y.o. female with medical history significant of hypertension, hyperlipidemia, GERD, rheumatoid arthritis, pulmonary hypertension, atrial fibrillation, who presents with fever and diarrhea. Her Bm's have improved. Stool work up shows  cdiff antigen positive, but toxin negative. Her fever resolved. She was admitted to step down for evaluation.   Assessment & Plan:   Principal Problem:   Sepsis ([place]) Active Problems:   Atrial fibrillation (HCC)   Hypertension   Hyperlipidemia   Rheumatoid arthritis (HCC)   Exudative pleural effusion   Long QT interval   Pericardial effusion   GERD (gastroesophageal reflux disease)   Diarrhea   Fall   Chronic systolic CHF (congestive heart failure) ([place])   Sepsis: Admitted to step down for management.  Her fever improved, tachycardia and bp improved.  Lactic acid normalized.  CXR does not show pneumonia.  UA negative.  Stool work up shows c diff antigen positive. Toxin negative.  Empirically on IV antibitoics and received a dose of flagyl in ED and her diarrhea improved.  Would restart flagyl and continue it.  Follow pro calcitonin.  Follow  Blood cultures, urine cultures.  Gentle hydration. Taper IV hydrocortisone.    Pericardial effusion:  Resume colchicine.  No new complaints.    Rheumatoid arthritis. On tapering dose of IV hydrocortisone.  Monitor blood counts.    Atrial fibrillation: rate slightly high.  Get 12 lead EKG.  Resume bb . Anticoagulation on hold after recent pericardiocentesis.   Hypertension: bp are more stable .    GERD:  Stable.   DVT prophylaxis: (Lovenox) Code Status: full code.  Family Communication:none at bedside.  Disposition Plan: pending further evaluation.    Consultants:   None.    Procedures:  None/   Antimicrobials:  vancomycin , cefepime and one dose of flagyl.    Subjective: Abdominal discomfort.   Objective: Vitals:   12/28/15 1039 12/28/15 1410 12/28/15 1500 12/28/15 1600  BP: 119/69 121/89    Pulse: 98  (!) 123   Resp: 21 (!) 21 (!) 29   Temp:  98.5 F (36.9 C)  98.3 F (36.8 C)  TempSrc:  Oral  Oral  SpO2: 99%  (!) 83%   Weight:  65.2 kg (143 lb 11.8 oz)    Height:  5\' 3"  (1.6 m)      Intake/Output Summary (Last 24 hours) at 12/28/15 1655 Last data filed at 12/28/15 0615  Gross per 24 hour  Intake             2300 ml  Output             1000 ml  Net             1300 ml   Filed Weights   12/27/15 2007 12/28/15 1410  Weight: 63.5 kg (140 lb) 65.2 kg (143 lb 11.8 oz)    Examination:  General exam: Appears calm and comfortable  Respiratory system: Clear to auscultation. Respiratory effort normal. Cardiovascular system: S1 & S2 heard, RRR. No JVD, murmurs, rubs, gallops or clicks. No pedal edema. Gastrointestinal system: Abdomen is nondistended, soft and nontender. No organomegaly or masses felt. Normal bowel sounds heard. Central nervous system: Alert and oriented. No focal neurological deficits. Extremities: Symmetric 5 x 5 power. Skin: No rashes, lesions or ulcers     Data Reviewed: I have personally reviewed following labs and  imaging studies  CBC:  Recent Labs Lab 12/27/15 2025 12/28/15 0428  WBC 20.6* 11.4*  NEUTROABS 17.1*  --   HGB 14.9 11.8*  HCT 43.8 36.5  MCV 89.8 93.8  PLT 261 0000000   Basic Metabolic Panel:  Recent Labs Lab 12/27/15 2025 12/28/15 0428  NA 134* 140  K 3.9 3.6  CL 99* 112*  CO2 26 23  GLUCOSE 137* 118*  BUN 9 9  CREATININE 0.81 0.61  CALCIUM 9.1 8.0*   GFR: Estimated Creatinine Clearance: 56 mL/min (by C-G formula based on SCr of 0.61 mg/dL). Liver Function Tests:  Recent Labs Lab 12/27/15 2025  AST 25  ALT 15  ALKPHOS 76  BILITOT 1.0  PROT 7.8  ALBUMIN 4.0   No results for input(s): LIPASE, AMYLASE in the last 168  hours. No results for input(s): AMMONIA in the last 168 hours. Coagulation Profile:  Recent Labs Lab 12/27/15 2025  INR 1.23   Cardiac Enzymes: No results for input(s): CKTOTAL, CKMB, CKMBINDEX, TROPONINI in the last 168 hours. BNP (last 3 results)  Recent Labs  12/13/15 1704  PROBNP 215.0*   HbA1C: No results for input(s): HGBA1C in the last 72 hours. CBG:  Recent Labs Lab 12/28/15 0822  GLUCAP 111*   Lipid Profile: No results for input(s): CHOL, HDL, LDLCALC, TRIG, CHOLHDL, LDLDIRECT in the last 72 hours. Thyroid Function Tests: No results for input(s): TSH, T4TOTAL, FREET4, T3FREE, THYROIDAB in the last 72 hours. Anemia Panel: No results for input(s): VITAMINB12, FOLATE, FERRITIN, TIBC, IRON, RETICCTPCT in the last 72 hours. Sepsis Labs:  Recent Labs Lab 12/27/15 2038 12/27/15 2322  LATICACIDVEN 2.07* 0.77    Recent Results (from the past 240 hour(s))  Culture, blood (Routine x 2)     Status: None (Preliminary result)   Collection Time: 12/27/15  8:26 PM  Result Value Ref Range Status   Specimen Description LEFT ANTECUBITAL BLOOD  Final   Special Requests BOTTLES DRAWN AEROBIC AND ANAEROBIC 5CC  Final   Culture   Final    NO GROWTH < 24 HOURS Performed at [facility]    Report Status PENDING  Incomplete  Culture, blood (Routine x 2)     Status: None (Preliminary result)   Collection Time: 12/27/15  8:53 PM  Result Value Ref Range Status   Specimen Description BLOOD RIGHT FOREARM  Final   Special Requests BOTTLES DRAWN AEROBIC AND ANAEROBIC 5CC  Final   Culture   Final    NO GROWTH < 24 HOURS Performed at [facility]    Report Status PENDING  Incomplete  C difficile quick scan w PCR reflex     Status: Abnormal   Collection Time: 12/28/15  8:07 AM  Result Value Ref Range Status   C Diff antigen POSITIVE (A) NEGATIVE Final   C Diff toxin NEGATIVE NEGATIVE Final   C Diff interpretation Results are indeterminate. See PCR results.   Final  Gastrointestinal Panel by PCR , Stool     Status: None   Collection Time: 12/28/15  8:07 AM  Result Value Ref Range Status   Campylobacter species NOT DETECTED NOT DETECTED Final   Plesimonas shigelloides NOT DETECTED NOT DETECTED Final   Salmonella species NOT DETECTED NOT DETECTED Final   Yersinia enterocolitica NOT DETECTED NOT DETECTED Final   Vibrio species NOT DETECTED NOT DETECTED Final   Vibrio cholerae NOT DETECTED NOT DETECTED Final   Enteroaggregative E coli (EAEC) NOT DETECTED NOT DETECTED Final   Enteropathogenic E coli (  EPEC) NOT DETECTED NOT DETECTED Final   Enterotoxigenic E coli (ETEC) NOT DETECTED NOT DETECTED Final   Shiga like toxin producing E coli (STEC) NOT DETECTED NOT DETECTED Final   Shigella/Enteroinvasive E coli (EIEC) NOT DETECTED NOT DETECTED Final   Cryptosporidium NOT DETECTED NOT DETECTED Final   Cyclospora cayetanensis NOT DETECTED NOT DETECTED Final   Entamoeba histolytica NOT DETECTED NOT DETECTED Final   Giardia lamblia NOT DETECTED NOT DETECTED Final   Adenovirus F40/41 NOT DETECTED NOT DETECTED Final   Astrovirus NOT DETECTED NOT DETECTED Final   Norovirus GI/GII NOT DETECTED NOT DETECTED Final   Rotavirus A NOT DETECTED NOT DETECTED Final   Sapovirus (I, II, IV, and V) NOT DETECTED NOT DETECTED Final  Clostridium Difficile by PCR     Status: None   Collection Time: 12/28/15  8:07 AM  Result Value Ref Range Status   Toxigenic C Difficile by pcr NEGATIVE NEGATIVE Final    Comment: Patient is colonized with non toxigenic C. difficile. May not need treatment unless significant symptoms are present. Performed at [facility]          Radiology Studies: Dg Chest 2 View  Result Date: 12/27/2015 CLINICAL DATA:  Fever shin EXAM: CHEST  2 VIEW COMPARISON:  12/16/2015 FINDINGS: Cardiac shadow remains enlarged. The lungs are well aerated bilaterally. No focal infiltrate or sizable effusion is seen. Previously seen pericardial catheter  has been removed. Degenerative change of the thoracic spine is noted. IMPRESSION: No active cardiopulmonary disease. Electronically Signed   By: Inez Catalina M.D.   On: 12/27/2015 21:23   Ct Head Wo Contrast  Result Date: 12/28/2015 CLINICAL DATA:  75 year old female with fall.  Generalized weakness. EXAM: CT HEAD WITHOUT CONTRAST TECHNIQUE: Contiguous axial images were obtained from the base of the skull through the vertex without intravenous contrast. COMPARISON:  Head CT dated 05/23/2015 FINDINGS: Brain: The ventricles and sulci are appropriate in size for patient's age. Mild periventricular and deep white matter chronic microvascular ischemic changes noted. There is no acute intracranial hemorrhage. No mass effect or midline shift noted. Vascular: No hyperdense vessel or unexpected calcification. Skull: Normal. Negative for fracture or focal lesion. Sinuses/Orbits: No acute finding. Other: None IMPRESSION: No acute intracranial pathology. Mild age-related atrophy and chronic microvascular ischemic changes. Electronically Signed   By: Anner Crete M.D.   On: 12/28/2015 02:04        Scheduled Meds: . ceFEPime (MAXIPIME) IV  1 g Intravenous Q8H  . cholecalciferol  2,000 Units Oral Daily  . colchicine  0.6 mg Oral Daily  . enoxaparin (LOVENOX) injection  40 mg Subcutaneous Q24H  . folic acid  2 mg Oral Daily  . hydrocortisone sod succinate (SOLU-CORTEF) inj  50 mg Intravenous Q6H  . metoprolol succinate  75 mg Oral BID  . multivitamin with minerals  1 tablet Oral Daily  . pravastatin  20 mg Oral QHS  . sodium chloride flush  3 mL Intravenous Q12H  . vancomycin  500 mg Intravenous Q12H  . verapamil  40 mg Oral Q12H   Continuous Infusions: . sodium chloride 1,000 mL (12/28/15 1507)     LOS: 1 day    Time spent: 2 minutes    Marchello Rothgeb, MD [facility] Pager [phone]  If 7PM-7AM, please contact night-coverage [email or web address] Password [email or web address] 12/28/2015, 4:55 PM

## 2015-12-28 NOTE — ED Notes (Signed)
Dr.Niu notified of blood pressure and verbal order for NS Bolus 1liter then 184ml/hr

## 2015-12-28 NOTE — ED Notes (Signed)
Awaiting meds from pharmacy. Pharmacy notified to send to ED.

## 2015-12-28 NOTE — Progress Notes (Signed)
Spoke with infection prevention and per c.diff postive antigen and negative toxin patient does not need to be on precautions for C.diff.  Only precautions for hx of MRSA positive last hospital stay.

## 2015-12-28 NOTE — ED Notes (Signed)
PT off floor at this time.

## 2015-12-28 NOTE — ED Notes (Signed)
Pt transported to floor by [facility].

## 2015-12-28 NOTE — ED Notes (Signed)
Dr.Niu at bedside to reassess pt status.

## 2015-12-29 ENCOUNTER — Inpatient Hospital Stay (HOSPITAL_COMMUNITY): Payer: Medicare Other

## 2015-12-29 DIAGNOSIS — I313 Pericardial effusion (noninflammatory): Secondary | ICD-10-CM

## 2015-12-29 DIAGNOSIS — D899 Disorder involving the immune mechanism, unspecified: Secondary | ICD-10-CM

## 2015-12-29 DIAGNOSIS — I482 Chronic atrial fibrillation: Secondary | ICD-10-CM

## 2015-12-29 DIAGNOSIS — I319 Disease of pericardium, unspecified: Secondary | ICD-10-CM

## 2015-12-29 LAB — GLUCOSE, CAPILLARY
GLUCOSE-CAPILLARY: 96 mg/dL (ref 65–99)
GLUCOSE-CAPILLARY: 127 mg/dL — AB (ref 65–99)

## 2015-12-29 LAB — BASIC METABOLIC PANEL
ANION GAP: 7 (ref 5–15)
BUN: 9 mg/dL (ref 6–20)
CO2: 24 mmol/L (ref 22–32)
CREATININE: 0.71 mg/dL (ref 0.44–1.00)
Calcium: 8.8 mg/dL — ABNORMAL LOW (ref 8.9–10.3)
Chloride: 111 mmol/L (ref 101–111)
GFR calc non Af Amer: >60 mL/min (ref >60)
Glucose, Bld: 122 mg/dL — ABNORMAL HIGH (ref 65–99)
POTASSIUM: 3.4 mmol/L — AB (ref 3.5–5.1)
SODIUM: 142 mmol/L (ref 135–145)

## 2015-12-29 LAB — CBC
HEMATOCRIT: 36.9 % (ref 36.0–46.0)
HEMOGLOBIN: 11.9 g/dL — AB (ref 12.0–15.0)
MCH: 30.5 pg (ref 26.0–34.0)
MCHC: 32.2 g/dL (ref 30.0–36.0)
MCV: 94.6 fL (ref 78.0–100.0)
Platelets: 206 10*3/uL (ref 150–400)
RBC: 3.9 MIL/uL (ref 3.87–5.11)
RDW: 15.8 % — ABNORMAL HIGH (ref 11.5–15.5)
WBC: 12.3 10*3/uL — AB (ref 4.0–10.5)

## 2015-12-29 LAB — URINE CULTURE: CULTURE: NO GROWTH

## 2015-12-29 LAB — ECHOCARDIOGRAM LIMITED
Height: 63 in
WEIGHTICAEL: 2299.84 [oz_av]

## 2015-12-29 LAB — PROCALCITONIN: PROCALCITONIN: 0.13 ng/mL

## 2015-12-29 MED ORDER — IOPAMIDOL (ISOVUE-300) INJECTION 61%
100.0000 mL | Freq: Once | INTRAVENOUS | Status: AC | PRN
  Administered 2015-12-29: 100 mL via INTRAVENOUS

## 2015-12-29 MED ORDER — POTASSIUM CHLORIDE CRYS ER 20 MEQ PO TBCR
40.0000 meq | EXTENDED_RELEASE_TABLET | Freq: Once | ORAL | Status: AC
  Administered 2015-12-29: 40 meq via ORAL
  Filled 2015-12-29: qty 2

## 2015-12-29 MED ORDER — IOPAMIDOL (ISOVUE-300) INJECTION 61%
15.0000 mL | Freq: Once | INTRAVENOUS | Status: DC | PRN
  Administered 2015-12-29: 15 mL via ORAL
  Filled 2015-12-29: qty 30

## 2015-12-29 MED ORDER — SODIUM CHLORIDE 0.9 % IJ SOLN
INTRAMUSCULAR | Status: AC
  Filled 2015-12-29: qty 50

## 2015-12-29 MED ORDER — IOPAMIDOL (ISOVUE-300) INJECTION 61%
INTRAVENOUS | Status: AC
  Filled 2015-12-29: qty 100

## 2015-12-29 NOTE — Progress Notes (Signed)
  Echocardiogram 2D Echocardiogram limited has been performed.  [name] 12/29/2015, 2:56 PM

## 2015-12-29 NOTE — Progress Notes (Signed)
PROGRESS NOTE    [name]  [id number] DOB: 1940-04-05 DOA: 12/27/2015 PCP: Leeroy Cha, MD    Brief Narrative:[name] is a 75 y.o. female with medical history significant of hypertension, hyperlipidemia, GERD, rheumatoid arthritis, pulmonary hypertension, atrial fibrillation, who presents with fever and diarrhea. Her Bm's have improved. Stool work up shows  cdiff antigen positive, but toxin negative. Her fever resolved. She was admitted to step down for evaluation.   Assessment & Plan:   Principal Problem:   Sepsis ([place]) Active Problems:   Atrial fibrillation (HCC)   Hypertension   Hyperlipidemia   Rheumatoid arthritis (HCC)   Exudative pleural effusion   Long QT interval   Pericardial effusion   GERD (gastroesophageal reflux disease)   Diarrhea   Fall   Chronic systolic CHF (congestive heart failure) ([place])   Sepsis in an heavily immunosuppressed individual: Patient is presented with fever of 103, tachycardia, leukocytosis, lactic acidosis, brief hypotension, sbp in 80's initially, n/v/d CXR does not show pneumonia. UA negative. Blood culture no growth, urine culture pending, Stool work up shows c diff antigen positive. Toxin negative. Gi pcr panel negative. She is admitted to stepdown and started on vanc/cefepime/flagyl/ivf /stress dose steroid on admission, home meds methotraxate, enbrel held  I donot see pro calcitonin being ordered or being processed  Her fever improved, tachycardia and bp improved. Lactic acid normalized. Diarrhea seems slowed down She report appetite is better, in fact she eat 100% breakfast on 12/13, will d/c ivf, continue hold lasix, continue current abx regimen, will check procalcitonin  Pericardial effusion:  Colchicine was ordered on admission Patient refuse take colchicine due to c/o rash on her chest after started on colchicine, she also thinks colchicine gives her diarrhea She does report more sob, but denies chest  pain, cardiology consulted, will defer cardiology on cholchicine mangement   Rheumatoid arthritis.  On methoraxate, enbrel and daily prednisone methotraxate and embrel held,  She is on stress dose steroids since admitted, will need to taper to baseline    Atrial fibrillation/RVR:   Rate not well controlled on metolprolol and verapamil Anticoagulation on hold after recent pericardiocentesis.  Hopefully with treating sepsis, heart rate will improve. Cardiology consulted  Hypertension:  With brief hypotension on admission bp are more stable on BB and CCB. Lasix held since admission   GERD:  Stable.   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   DVT prophylaxis: (Lovenox) Code Status: full code.  Family Communication:none at bedside.  Disposition Plan: pending further evaluation.    Consultants:   cardiology   Procedures:  None/   Antimicrobials: vancomycin , cefepime and  flagyl.    Subjective: Report had heavy sweat and chills last night, though no documented fever,  She states her appetite is back,no n/v, but persistent diarrhea and gas pain in her abdomen She report feeling more sob with DOE then usual , though denies chest pain, no cough, no edema in lower extremity  Objective: Vitals:   12/29/15 0500 12/29/15 0600 12/29/15 0623 12/29/15 0700  BP:  (!) 96/56    Pulse: 89 95 85 99  Resp: (!) 21 15 19 17   Temp:      TempSrc:      SpO2: 96% 95% 96% 96%  Weight:      Height:        Intake/Output Summary (Last 24 hours) at 12/29/15 [id number] Last data filed at  12/29/15 0700  Gross per 24 hour  Intake          2168.33 ml  Output              600 ml  Net          1568.33 ml   Filed Weights   12/27/15 2007 12/28/15 1410  Weight: 63.5 kg (140 lb) 65.2 kg (143 lb 11.8 oz)    Examination:  General exam: Appears calm and comfortable  Respiratory system: Clear  to auscultation. Respiratory effort normal. Cardiovascular system: IRRR. No pedal edema. Gastrointestinal system: Abdomen is nondistended, soft and nontender. No organomegaly or masses felt. Normal bowel sounds heard. Central nervous system: Alert and oriented. No focal neurological deficits. Extremities: Symmetric 5 x 5 power. Skin: No rashes, lesions or ulcers     Data Reviewed: I have personally reviewed following labs and imaging studies  CBC:  Recent Labs Lab 12/27/15 2025 12/28/15 0428 12/29/15 0334  WBC 20.6* 11.4* 12.3*  NEUTROABS 17.1*  --   --   HGB 14.9 11.8* 11.9*  HCT 43.8 36.5 36.9  MCV 89.8 93.8 94.6  PLT 261 188 [id number]   Basic Metabolic Panel:  Recent Labs Lab 12/27/15 2025 12/28/15 0428 12/29/15 0334  NA 134* 140 142  K 3.9 3.6 3.4*  CL 99* 112* 111  CO2 26 23 24   GLUCOSE 137* 118* 122*  BUN 9 9 9   CREATININE 0.81 0.61 0.71  CALCIUM 9.1 8.0* 8.8*   GFR: Estimated Creatinine Clearance: 56 mL/min (by C-G formula based on SCr of 0.71 mg/dL). Liver Function Tests:  Recent Labs Lab 12/27/15 2025  AST 25  ALT 15  ALKPHOS 76  BILITOT 1.0  PROT 7.8  ALBUMIN 4.0   No results for input(s): LIPASE, AMYLASE in the last 168 hours. No results for input(s): AMMONIA in the last 168 hours. Coagulation Profile:  Recent Labs Lab 12/27/15 2025  INR 1.23   Cardiac Enzymes: No results for input(s): CKTOTAL, CKMB, CKMBINDEX, TROPONINI in the last 168 hours. BNP (last 3 results)  Recent Labs  12/13/15 1704  PROBNP 215.0*   HbA1C: No results for input(s): HGBA1C in the last 72 hours. CBG:  Recent Labs Lab 12/28/15 0822  GLUCAP 111*   Lipid Profile: No results for input(s): CHOL, HDL, LDLCALC, TRIG, CHOLHDL, LDLDIRECT in the last 72 hours. Thyroid Function Tests: No results for input(s): TSH, T4TOTAL, FREET4, T3FREE, THYROIDAB in the last 72 hours. Anemia Panel: No results for input(s): VITAMINB12, FOLATE, FERRITIN, TIBC, IRON, RETICCTPCT in  the last 72 hours. Sepsis Labs:  Recent Labs Lab 12/27/15 2038 12/27/15 2322  LATICACIDVEN 2.07* 0.77    Recent Results (from the past 240 hour(s))  Culture, blood (Routine x 2)     Status: None (Preliminary result)   Collection Time: 12/27/15  8:26 PM  Result Value Ref Range Status   Specimen Description LEFT ANTECUBITAL BLOOD  Final   Special Requests BOTTLES DRAWN AEROBIC AND ANAEROBIC 5CC  Final   Culture   Final    NO GROWTH < 24 HOURS Performed at [facility]    Report Status PENDING  Incomplete  Culture, blood (Routine x 2)     Status: None (Preliminary result)   Collection Time: 12/27/15  8:53 PM  Result Value Ref Range Status   Specimen Description BLOOD RIGHT FOREARM  Final   Special Requests BOTTLES DRAWN AEROBIC AND ANAEROBIC 5CC  Final   Culture   Final    NO GROWTH <  24 HOURS Performed at [facility]    Report Status PENDING  Incomplete  C difficile quick scan w PCR reflex     Status: Abnormal   Collection Time: 12/28/15  8:07 AM  Result Value Ref Range Status   C Diff antigen POSITIVE (A) NEGATIVE Final   C Diff toxin NEGATIVE NEGATIVE Final   C Diff interpretation Results are indeterminate. See PCR results.  Final  Gastrointestinal Panel by PCR , Stool     Status: None   Collection Time: 12/28/15  8:07 AM  Result Value Ref Range Status   Campylobacter species NOT DETECTED NOT DETECTED Final   Plesimonas shigelloides NOT DETECTED NOT DETECTED Final   Salmonella species NOT DETECTED NOT DETECTED Final   Yersinia enterocolitica NOT DETECTED NOT DETECTED Final   Vibrio species NOT DETECTED NOT DETECTED Final   Vibrio cholerae NOT DETECTED NOT DETECTED Final   Enteroaggregative E coli (EAEC) NOT DETECTED NOT DETECTED Final   Enteropathogenic E coli (EPEC) NOT DETECTED NOT DETECTED Final   Enterotoxigenic E coli (ETEC) NOT DETECTED NOT DETECTED Final   Shiga like toxin producing E coli (STEC) NOT DETECTED NOT DETECTED Final    Shigella/Enteroinvasive E coli (EIEC) NOT DETECTED NOT DETECTED Final   Cryptosporidium NOT DETECTED NOT DETECTED Final   Cyclospora cayetanensis NOT DETECTED NOT DETECTED Final   Entamoeba histolytica NOT DETECTED NOT DETECTED Final   Giardia lamblia NOT DETECTED NOT DETECTED Final   Adenovirus F40/41 NOT DETECTED NOT DETECTED Final   Astrovirus NOT DETECTED NOT DETECTED Final   Norovirus GI/GII NOT DETECTED NOT DETECTED Final   Rotavirus A NOT DETECTED NOT DETECTED Final   Sapovirus (I, II, IV, and V) NOT DETECTED NOT DETECTED Final  Clostridium Difficile by PCR     Status: None   Collection Time: 12/28/15  8:07 AM  Result Value Ref Range Status   Toxigenic C Difficile by pcr NEGATIVE NEGATIVE Final    Comment: Patient is colonized with non toxigenic C. difficile. May not need treatment unless significant symptoms are present. Performed at [facility]          Radiology Studies: Dg Chest 2 View  Result Date: 12/27/2015 CLINICAL DATA:  Fever shin EXAM: CHEST  2 VIEW COMPARISON:  12/16/2015 FINDINGS: Cardiac shadow remains enlarged. The lungs are well aerated bilaterally. No focal infiltrate or sizable effusion is seen. Previously seen pericardial catheter has been removed. Degenerative change of the thoracic spine is noted. IMPRESSION: No active cardiopulmonary disease. Electronically Signed   By: Inez Catalina M.D.   On: 12/27/2015 21:23   Ct Head Wo Contrast  Result Date: 12/28/2015 CLINICAL DATA:  75 year old female with fall.  Generalized weakness. EXAM: CT HEAD WITHOUT CONTRAST TECHNIQUE: Contiguous axial images were obtained from the base of the skull through the vertex without intravenous contrast. COMPARISON:  Head CT dated 05/23/2015 FINDINGS: Brain: The ventricles and sulci are appropriate in size for patient's age. Mild periventricular and deep white matter chronic microvascular ischemic changes noted. There is no acute intracranial hemorrhage. No mass effect or  midline shift noted. Vascular: No hyperdense vessel or unexpected calcification. Skull: Normal. Negative for fracture or focal lesion. Sinuses/Orbits: No acute finding. Other: None IMPRESSION: No acute intracranial pathology. Mild age-related atrophy and chronic microvascular ischemic changes. Electronically Signed   By: Anner Crete M.D.   On: 12/28/2015 02:04        Scheduled Meds: . ceFEPime (MAXIPIME) IV  1 g Intravenous Q8H  . cholecalciferol  2,000  Units Oral Daily  . colchicine  0.6 mg Oral Daily  . enoxaparin (LOVENOX) injection  40 mg Subcutaneous Q24H  . folic acid  2 mg Oral Daily  . hydrocortisone sod succinate (SOLU-CORTEF) inj  25 mg Intravenous Q6H  . metoprolol succinate  75 mg Oral BID  . metroNIDAZOLE  500 mg Oral Q8H  . multivitamin with minerals  1 tablet Oral Daily  . pravastatin  20 mg Oral QHS  . sodium chloride flush  3 mL Intravenous Q12H  . vancomycin  500 mg Intravenous Q12H  . verapamil  40 mg Oral Q12H   Continuous Infusions:    LOS: 2 days    Time spent: 35 minutes    Nicole Looper, MD PhD [facility] Pager [phone]  If 7PM-7AM, please contact night-coverage [email or web address] Password [email or web address] 12/29/2015, 8:32 AM

## 2015-12-29 NOTE — Care Management Note (Signed)
Case Management Note  Patient Details  Name: [name] MRN: [id number] Date of Birth: 1940-11-22  Subjective/Objective:     sepsis               Action/Plan:Date:  December 29, 2015 Chart reviewed for concurrent status and case management needs. Will continue to follow patient progress. Discharge Planning: following for needs Expected discharge date: [id number] Velva Harman, BSN, [place], [place]   Expected Discharge Date:   (unknown)               Expected Discharge Plan:  [place]  In-House Referral:     Discharge planning Services     Post Acute Care Choice:    Choice offered to:     DME Arranged:    DME Agency:     HH Arranged:    [place] Agency:     Status of Service:  In process, will continue to follow  If discussed at Long Length of Stay Meetings, dates discussed:    Additional Comments:  Leeroy Cha, RN 12/29/2015, 10:39 AM
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
## 2015-12-30 DIAGNOSIS — D849 Immunodeficiency, unspecified: Secondary | ICD-10-CM

## 2015-12-30 DIAGNOSIS — E440 Moderate protein-calorie malnutrition: Secondary | ICD-10-CM | POA: Insufficient documentation

## 2015-12-30 DIAGNOSIS — D899 Disorder involving the immune mechanism, unspecified: Secondary | ICD-10-CM

## 2015-12-30 DIAGNOSIS — A419 Sepsis, unspecified organism: Principal | ICD-10-CM

## 2015-12-30 LAB — CBC WITH DIFFERENTIAL/PLATELET
Basophils Absolute: 0 10*3/uL (ref 0.0–0.1)
Basophils Relative: 0 %
EOS PCT: 0 %
Eosinophils Absolute: 0 10*3/uL (ref 0.0–0.7)
HCT: 33.8 % — ABNORMAL LOW (ref 36.0–46.0)
Hemoglobin: 10.8 g/dL — ABNORMAL LOW (ref 12.0–15.0)
LYMPHS ABS: 1.6 10*3/uL (ref 0.7–4.0)
LYMPHS PCT: 23 %
MCH: 30 pg (ref 26.0–34.0)
MCHC: 32 g/dL (ref 30.0–36.0)
MCV: 93.9 fL (ref 78.0–100.0)
MONO ABS: 0.5 10*3/uL (ref 0.1–1.0)
MONOS PCT: 6 %
Neutro Abs: 5.1 10*3/uL (ref 1.7–7.7)
Neutrophils Relative %: 71 %
PLATELETS: 191 10*3/uL (ref 150–400)
RBC: 3.6 MIL/uL — ABNORMAL LOW (ref 3.87–5.11)
RDW: 15.8 % — AB (ref 11.5–15.5)
WBC: 7.2 10*3/uL (ref 4.0–10.5)

## 2015-12-30 LAB — MAGNESIUM: Magnesium: 2 mg/dL (ref 1.7–2.4)

## 2015-12-30 LAB — BASIC METABOLIC PANEL
Anion gap: 5 (ref 5–15)
BUN: 10 mg/dL (ref 6–20)
CALCIUM: 8.8 mg/dL — AB (ref 8.9–10.3)
CO2: 24 mmol/L (ref 22–32)
Chloride: 111 mmol/L (ref 101–111)
Creatinine, Ser: 0.67 mg/dL (ref 0.44–1.00)
GFR calc Af Amer: >60 mL/min (ref >60)
GLUCOSE: 103 mg/dL — AB (ref 65–99)
Potassium: 3.5 mmol/L (ref 3.5–5.1)
Sodium: 140 mmol/L (ref 135–145)

## 2015-12-30 LAB — MRSA PCR SCREENING: MRSA by PCR: NEGATIVE

## 2015-12-30 LAB — GLUCOSE, CAPILLARY: Glucose-Capillary: 102 mg/dL — ABNORMAL HIGH (ref 65–99)

## 2015-12-30 MED ORDER — DOXYCYCLINE HYCLATE 100 MG PO TABS
100.0000 mg | ORAL_TABLET | Freq: Two times a day (BID) | ORAL | Status: DC
  Administered 2015-12-30 – 2016-01-02 (×6): 100 mg via ORAL
  Filled 2015-12-30 (×6): qty 1

## 2015-12-30 MED ORDER — POTASSIUM CHLORIDE CRYS ER 20 MEQ PO TBCR
40.0000 meq | EXTENDED_RELEASE_TABLET | Freq: Once | ORAL | Status: AC
  Administered 2015-12-30: 40 meq via ORAL
  Filled 2015-12-30: qty 2

## 2015-12-30 MED ORDER — FUROSEMIDE 40 MG PO TABS
40.0000 mg | ORAL_TABLET | Freq: Every evening | ORAL | Status: DC
  Administered 2015-12-30 – 2016-01-01 (×3): 40 mg via ORAL
  Filled 2015-12-30 (×3): qty 1

## 2015-12-30 MED ORDER — METOPROLOL SUCCINATE ER 100 MG PO TB24
100.0000 mg | ORAL_TABLET | Freq: Two times a day (BID) | ORAL | Status: DC
  Administered 2015-12-30 – 2016-01-02 (×7): 100 mg via ORAL
  Filled 2015-12-30 (×3): qty 1
  Filled 2015-12-30: qty 4
  Filled 2015-12-30 (×3): qty 1

## 2015-12-30 MED ORDER — HYDROCORTISONE NA SUCCINATE PF 100 MG IJ SOLR
25.0000 mg | Freq: Two times a day (BID) | INTRAMUSCULAR | Status: DC
  Administered 2015-12-30 – 2015-12-31 (×2): 25 mg via INTRAVENOUS
  Filled 2015-12-30 (×2): qty 2

## 2015-12-30 NOTE — Progress Notes (Signed)
Pharmacy Antibiotic Note  [name] is a 75 y.o. female with PMH of a-fib (warfarin currently on hold), RA, HTN, HLD, recent pericardial effusion s/p pericardiocentesis, recent PNA, admitted on 12/27/2015 with sepsis.  Patient presents with weakness, fever, cough, diarrhea, and hallucinations. Pharmacy has been consulted for Vancomycin and Cefepime dosing. Noted penicillin allergy, but has tolerated multiple cephalosporins in the past.   12/14:  D3 Antibiotics - Watery diarrhea - now improved.  Tests essentially r/o C. diff - WBC improved to WNL - LA to WNL - ? Inc d/t dehydration  Plan: Vancomycin 500mg  IV q12h.  Cefepime 1g IV q8h Flagyl 500mg  IV q8h per MD Monitor renal function, cultures, clinical course.  Holding off on checking VT as MD may narrow abx today.  If vancomycin continued, check VT in AM.    Height: 5\' 3"  (160 cm) Weight: 143 lb 11.8 oz (65.2 kg) IBW/kg (Calculated) : 52.4  Temp (24hrs), Avg:98 F (36.7 C), Min:97.5 F (36.4 C), Max:98.3 F (36.8 C)   Recent Labs Lab 12/27/15 2025 12/27/15 2038 12/27/15 2322 12/28/15 0428 12/29/15 0334 12/30/15 0326  WBC 20.6*  --   --  11.4* 12.3* 7.2  CREATININE 0.81  --   --  0.61 0.71 0.67  LATICACIDVEN  --  2.07* 0.77  --   --   --     Estimated Creatinine Clearance: 56 mL/min (by C-G formula based on SCr of 0.67 mg/dL).    Allergies  Allergen Reactions  . Arthrotec [Diclofenac-Misoprostol] Diarrhea  . Erythromycin Anaphylaxis       . Morphine Sulfate Other (See Comments)    Reaction:  Hallucinations   . Amoxicillin Rash and Other (See Comments)    Has patient had a PCN reaction causing immediate rash, facial/tongue/throat swelling, SOB or lightheadedness with hypotension: Yes Has patient had a PCN reaction causing severe rash involving mucus membranes or skin necrosis: Yes Has patient had a PCN reaction that required hospitalization No Has patient had a PCN reaction occurring within the last 10  years: No If all of the above answers are "NO", then may proceed with Cephalosporin use.  . Diltiazem Hcl Swelling, Rash and Other (See Comments)    Pt is able to tolerate Verapamil.    . Gold-Containing Drug Products Rash  . Macrodantin [Nitrofurantoin Macrocrystal] Rash  . Naproxen Rash  . Penicillins Rash and Other (See Comments)    Has patient had a PCN reaction causing immediate rash, facial/tongue/throat swelling, SOB or lightheadedness with hypotension: Yes Has patient had a PCN reaction causing severe rash involving mucus membranes or skin necrosis: Yes Has patient had a PCN reaction that required hospitalization No Has patient had a PCN reaction occurring within the last 10 years: No If all of the above answers are "NO", then may proceed with Cephalosporin use.     Antimicrobials this admission: 12/11 >> Vancomycin >> 12/11 >> Cefepime >> 12/12 >> Flagyl >>   Dose adjustments this admission: --   Microbiology results: 12/11 BCx: NGTD 12/11 UCx: NG 12/12: c.diff antigen positive / toxin negative, PCR negative 12/12: C. Diff PCR: neg 12/12: GI panel: neg 11/30 MRSA PCR: Positive 11/30 pericardial fluid: NG-F  Thank you for allowing pharmacy to be a part of this patient's care.  Ralene Bathe, PharmD, BCPS 12/30/2015, 1:21 PM  Pager: [phone]

## 2015-12-30 NOTE — Progress Notes (Signed)
Initial Nutrition Assessment  DOCUMENTATION CODES:   Non-severe (moderate) malnutrition in context of acute illness/injury  INTERVENTION:  - Continue daily multivitamin. - Continue to encourage PO intakes of meals. - RD will continue to monitor for needs, including need for oral nutrition supplements.   NUTRITION DIAGNOSIS:   Inadequate oral intake related to poor appetite, acute illness, chronic illness as evidenced by per patient/family report.  GOAL:   Patient will meet greater than or equal to 90% of their needs  MONITOR:   PO intake, Weight trends, Labs, I & O's, Other (Comment) (Need for oral nutrition supplement)  REASON FOR ASSESSMENT:   Malnutrition Screening Tool  ASSESSMENT:   75 y.o. female with medical history significant of hypertension, hyperlipidemia, GERD, rheumatoid arthritis, pulmonary hypertension, atrial fibrillation, who presents with fever and diarrhea. Patient was recently hospitalized from 11/29-12/3 due to pericardial effusion. Pt underwent pericardiocentesis and pericardial drain placement with removal of 500 mL of dark sanguinous fluid. She underwent repeated echocardiograms without significant fluid accumulation before discharge. Pt states that she developed fever and diarrhea in the past 2 days. She had 3 bowel movements with watery stool yesterday. Had nausea and vomiting x 2 episodes PTA. No abdominal pain. She has mild cough, but no chest pain or shortness of breath. Patient states that she has generalized weakness, and had a fall on Friday, injured her frontal area. She states that she has itchy rashes in frontal chest and abdominal wall.  Pt seen for MST. BMI indicates overweight status which is appropriate for age. Per chart review, pt consumed 100% of breakfast which pt reports was [place] toast, bacon, juice, and coffee. She denies any abdominal pain or nausea with intakes of breakfast and was planning to order lunch shortly after RD visit. She  states that she has been hospitalized several times since May 2017 and that she has had PNA x4 since that time in addition to other health-related issues. D/t this she has had a decreased appetite with associated decreased PO intakes. Since admission, her appetite has been much better than usual and pt is hopeful that appetite will maintain after d/c.   She denies any difficulties with eating, including any difficulties with chewing or swallowing or any abdominal discomfort. She states it has simply been a lack of appetite d/t feeling "wiped out" from multiple illnesses over the past 7 months.   Physical assessment shows mild muscle wasting around temples and to lower legs. Per chart review, she has lost 5 lbs (3% body weight) in the past 2 weeks which is significant for time frame.   Medications reviewed; 2000 units vitamin D/day, 2 mg oral folic acid/day, 25 mg IV Solu-Cortef BID, daily multivitamin with minerals, 40 mEq oral KCl x1 dose today.  Labs reviewed; CBG: 102 mg/dL this AM, Ca: 8.8 mg/dL.     Diet Order:  Diet regular Room service appropriate? Yes; Fluid consistency: Thin  Skin:  Reviewed, no issues  Last BM:  12/13  Height:   Ht Readings from Last 1 Encounters:  12/28/15 5\' 3"  (1.6 m)    Weight:   Wt Readings from Last 1 Encounters:  12/28/15 143 lb 11.8 oz (65.2 kg)    Ideal Body Weight:  52.27 kg  BMI:  Body mass index is 25.46 kg/m.  Estimated Nutritional Needs:   Kcal:  1435-1695 (22-26 kcal/kg)  Protein:  65-78 grams (1-1.2 grams/kg)  Fluid:  >/= 1.5 L/day  EDUCATION NEEDS:   No education needs identified at this time  Jarome Matin, MS, RD, LDN, [facility] Clinical Dietitian Pager # [phone] After hours/weekend pager # [phone]

## 2015-12-30 NOTE — Progress Notes (Signed)
Patient is stable at transfer. Report given to floor RN.

## 2015-12-30 NOTE — Progress Notes (Signed)
Patient got an order to transfer patient, but patient complain of lightheadedness, and shortness of breath, HR is A fib and still uncontrolled and sustaining at 120-130 at rest. N.P. Informed and ordered to cancel transfer at this time. Will continue to monitor the patient.

## 2015-12-30 NOTE — Progress Notes (Signed)
Patient Name: [name] Date of Encounter: 12/30/2015  Primary Cardiologist: Dr. Marinda Elk Problem List     Principal Problem:   Sepsis [facility]) Active Problems:   Atrial fibrillation ([place])   Hypertension   Hyperlipidemia   Rheumatoid arthritis ([place])   Exudative pleural effusion   Long QT interval   Pericardial effusion   GERD (gastroesophageal reflux disease)   Diarrhea   Fall   Chronic systolic CHF (congestive heart failure) ([place])    Subjective   Reports having a CT scan last night and having to consume a large amount of contrast, after which she developed palpitations and dizziness. HR peaking into the 130's overnight. Denies any palpitations at this time. No chest discomfort or dyspnea.   Inpatient Medications    Scheduled Meds: . ceFEPime (MAXIPIME) IV  1 g Intravenous Q8H  . cholecalciferol  2,000 Units Oral Daily  . enoxaparin (LOVENOX) injection  40 mg Subcutaneous Q24H  . folic acid  2 mg Oral Daily  . hydrocortisone sod succinate (SOLU-CORTEF) inj  25 mg Intravenous Q6H  . metoprolol succinate  75 mg Oral BID  . metroNIDAZOLE  500 mg Oral Q8H  . multivitamin with minerals  1 tablet Oral Daily  . pravastatin  20 mg Oral QHS  . sodium chloride flush  3 mL Intravenous Q12H  . vancomycin  500 mg Intravenous Q12H  . verapamil  40 mg Oral Q12H   Continuous Infusions:  PRN Meds: acetaminophen, fluticasone, guaiFENesin, hydrOXYzine, iopamidol   Vital Signs    Vitals:   12/30/15 0200 12/30/15 0319 12/30/15 0400 12/30/15 0600  BP: 124/85  115/83 116/77  Pulse:      Resp: (!) 24  17 20   Temp:  97.5 F (36.4 C)    TempSrc:  Oral    SpO2:      Weight:      Height:        Intake/Output Summary (Last 24 hours) at 12/30/15 [id number] Last data filed at 12/30/15 0532  Gross per 24 hour  Intake              710 ml  Output                0 ml  Net              710 ml   Filed Weights   12/27/15 2007 12/28/15 1410  Weight: 140 lb (63.5 kg) 143  lb 11.8 oz (65.2 kg)    Physical Exam    GEN: Well nourished, well developed Caucasian female appearing in no acute distress.  HEENT: Grossly normal.  Neck: Supple, no JVD, carotid bruits, or masses. Cardiac: Irregularly irregular, no murmurs, rubs, or gallops. No clubbing, cyanosis, edema.  Radials/DP/PT 2+ and equal bilaterally.  Respiratory:  Respirations regular and unlabored, clear to auscultation bilaterally. GI: Soft, nontender, nondistended, BS + x 4. MS: no deformity or atrophy. Skin: warm and dry, no rash. Neuro:  Strength and sensation are intact. Psych: AAOx3.  Normal affect.  Labs    CBC  Recent Labs  12/27/15 2025  12/29/15 0334 12/30/15 0326  WBC 20.6*  < > 12.3* 7.2  NEUTROABS 17.1*  --   --  5.1  HGB 14.9  < > 11.9* 10.8*  HCT 43.8  < > 36.9 33.8*  MCV 89.8  < > 94.6 93.9  PLT 261  < > 206 191  < > = values in this interval not displayed. Basic Metabolic Panel  Recent Labs  12/29/15 0334 12/30/15 0326  NA 142 140  K 3.4* 3.5  CL 111 111  CO2 24 24  GLUCOSE 122* 103*  BUN 9 10  CREATININE 0.71 0.67  CALCIUM 8.8* 8.8*  MG  --  2.0   Liver Function Tests  Recent Labs  12/27/15 2025  AST 25  ALT 15  ALKPHOS 76  BILITOT 1.0  PROT 7.8  ALBUMIN 4.0    Telemetry    Atrial fibrillation, HR in low-100's to 120's. Peaking into 130's overnight.  - Personally Reviewed  ECG   No new tracings.   Radiology    Ct Abdomen Pelvis W Contrast  Result Date: 12/29/2015 CLINICAL DATA:  Chronic AFib with worsening fatigue in diarrhea EXAM: CT ABDOMEN AND PELVIS WITH CONTRAST TECHNIQUE: Multidetector CT imaging of the abdomen and pelvis was performed using the standard protocol following bolus administration of intravenous contrast. CONTRAST:  147mL ISOVUE-300 IOPAMIDOL (ISOVUE-300) INJECTION 61% COMPARISON:  05/25/2015 ultrasound, CT 04/08/2010 FINDINGS: Lower chest: Small bilateral right greater than left pleural effusion. Passive atelectasis in the  bilateral lower lobes. 2.5 cm cyst or bleb in the right lower lobe. Small bleb posterior left lower lobe. There is cardiomegaly with biatrial enlargement. Small pericardial effusion. Small nodular focus right middle lobe, possibly inflammatory or related to atelectasis. Hepatobiliary: Mild diffuse decreased density of the hepatic parenchyma consistent with fatty change. No focal hepatic abnormality. The gallbladder is contracted and filled with hyperdense sludge or calcified stones. No biliary dilatation. Pancreas: Unremarkable. No pancreatic ductal dilatation or surrounding inflammatory changes. Spleen: Normal in size without focal abnormality. Adrenals/Urinary Tract: Adrenal glands are within normal limits. Subcentimeter cortical hypodense lesions in the kidneys, too small to further characterize. No hydronephrosis. Bladder is unremarkable Stomach/Bowel: Stomach is nonenlarged. No dilated small bowel to suggest an obstruction. There is no colon wall thickening. The appendix is not well identified but no right lower quadrant inflammation is seen. Vascular/Lymphatic: Aortic atherosclerosis. No enlarged abdominal or pelvic lymph nodes. Reproductive: Status post hysterectomy. No adnexal masses. Other: Trace amount of fluid in the pelvis.  No free air. Musculoskeletal: Degenerative changes of the spine. Probable hemangioma in T11. IMPRESSION: 1. No evidence for bowel obstruction. No significant colon wall thickening or colon inflammation. 2. Small bilateral pleural effusions, right greater than left. 3. Cardiomegaly with biatrial enlargement. Small pericardial effusion. 4. Mild fatty infiltration of the liver 5. Contracted gallbladder filled with calcified stones Electronically Signed   By: Donavan Foil M.D.   On: 12/29/2015 21:02    Cardiac Studies   Echocardiogram: 12/30/2015 Study Conclusions  - Left ventricle: The cavity size was normal. Wall thickness was   increased in a pattern of mild LVH. Systolic  function was normal.   The estimated ejection fraction was in the range of 50% to 55%.   Wall motion was normal; there were no regional wall motion   abnormalities. - Mitral valve: There was mild regurgitation. - Left atrium: The atrium was moderately dilated. - Right ventricle: The cavity size was mildly dilated. Wall   thickness was normal. - Right atrium: The atrium was mildly dilated. - Atrial septum: No defect or patent foramen ovale was identified. - Pulmonary arteries: PA peak pressure: 34 mm Hg (S). - Pericardium, extracardiac: Small posterior pericardial effuson.  Patient Profile     75 y.o. female with past medical history of chronic atrial fibrillation (on Coumadin), HTN, HLD, RA and recent pericardial effusion (s/p pericardiocentesis and drain placement on 12/16/2015) admitted to [facility]  on 12/27/2015 for evaluation of worsening fatigue and diarrhea. Diagnosed with SIRS. Cardiology consulted for atrial fibrillation with RVR.   Assessment & Plan    1. Chronic Atrial Fibrillation - has known history of chronic atrial fibrillation. HR has remained elevated overnight, in the 120's - 130's. Will increase her Toprol-XL from 75mg  BID to 100mg  BID. Continue Verapamil 40mg  BID.  - This patients CHA2DS2-VASc Score and unadjusted Ischemic Stroke Rate (% per year) is equal to 3.2 % stroke rate/year from a score of 3 (HTN, Age, Female). Coumadin currently held in the setting of her recent pericardiocentesis with bloody drainage with plans to restart this 2-4 weeks out from her procedure.   2. Recent Pericardial Effusion - recently admitted from 11/29 - 12/19/2015 for a large pericardial effusion having undergone a pericardiocentesis with drain placement on 12/16/2015 with removal of 557mL of dark sanguinous fluid. Fluid count showed 7800 WBC with no culture growth. Recommended to hold Coumadin for at least 2-4 weeks with plans to recheck a limited echo at that time prior to resuming Coumadin.  -  unable to tolerate Colchicine secondary to diarrhea and rash. Currently on Enbrel, Methotrexate, and low-dose Prednisone for her RA.  - repeat echo this admission read as "small posterior pericardial effuson. There was no pericardial effusion." Will review images with Dr. Marlou Porch to clarify. With CT Scan showing a small pericardial effusion as well, that is likely the most accurate assessment.  3. HTN - BP at 106/63 - 124/85 in the past 24 hours.  - continue with current medication regimen as outlined above.   4. HLD - continue statin therapy.   5. SIRS - presented with weakness and AMS. Was febrile, tachycardiac, tachypnea, hypotensive, and had an elevated lactic acid and WBC count. CXR with no acute findings and UA negative. Blood cultures show no growth to date.  - currently on broad spectrum antibiotics.  - per admitting team  Signed, Erma Heritage, PA  12/30/2015, 8:12 AM   Small posterior pericardial effusion on echo. Should be of no clinical consequence at this point.  AFIB with increased rate last night 130. Agree with increase of Bb.  Will hold off of restart coumadin at this time.  Irreg irreg, alert.   Candee Furbish, MD

## 2015-12-30 NOTE — Progress Notes (Signed)
PROGRESS NOTE    THERSIA SEARFOSS  [id number] DOB: December 29, 1940 DOA: 12/27/2015 PCP: Leeroy Cha, MD    Brief Narrative:[name] is a 75 y.o. female with medical history significant of hypertension, hyperlipidemia, GERD, rheumatoid arthritis, pulmonary hypertension, atrial fibrillation, who presents with fever and diarrhea. Her Bm's have improved. Stool work up shows  cdiff antigen positive, but toxin negative. Her fever resolved. She was admitted to step down for evaluation.   Assessment & Plan:   Principal Problem:   Sepsis ([place]) Active Problems:   Atrial fibrillation (HCC)   Hypertension   Hyperlipidemia   Rheumatoid arthritis (HCC)   Exudative pleural effusion   Long QT interval   Pericardial effusion   GERD (gastroesophageal reflux disease)   Diarrhea   Fall   Chronic systolic CHF (congestive heart failure) (HCC)   Sepsis in an heavily immunosuppressed individual: with questionable cdiff (+gi symptom) Patient is presented with fever of 103, tachycardia, leukocytosis, lactic acidosis, brief hypotension, sbp in 80's initially, with abdominal pain, n/v/d CXR does not show pneumonia. UA negative. Blood culture no growth, urine culture no growth, Stool work up shows c diff antigen positive. Toxin negative. Gi pcr panel negative. She is admitted to stepdown and started on vanc/cefepime/flagyl/ivf /stress dose steroid on admission, home meds methotraxate, enbrel held  I donot see procalcitonin being ordered or being processed on admission, procalcitonin on 12/13 0.13 after two days of abx and patient with clinical improvement  fever has resolved, tachycardia and bp improved. Lactic acid normalized.  But persistent abdominal pain and diarrhea, CT ab /pel with gallstone, but no cholecystitis, no colitis.  Due to persistent gi symptom, will continue flagyl. D/c ivf, taper steroids.   Pericardial effusion:  Colchicine was ordered on admission Patient refuse take  colchicine due to c/o rash on her chest after started on colchicine, she also thinks colchicine gives her diarrhea She does report more sob, but denies chest pain, cardiology consulted, oked with d/c cholchicine. Repeat echo with minimal pericardial effusion.   Atrial fibrillation/RVR:   Rate not well controlled on metolprolol and verapamil Anticoagulation on hold after recent pericardiocentesis.  Hopefully with treating sepsis, heart rate will improve. Cardiology consulted  Rheumatoid arthritis.  On methoraxate, enbrel and daily prednisone methotraxate and embrel held,  She is on stress dose steroids since admitted, will need to taper to baseline   Hypertension:  With brief hypotension on admission , she was put on stress dose steroids bp now stable on BB and CCB, taper stress dose steroids to baseline dose. Lasix held since admission   GERD:  Stable.   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   Non-severe (moderate) malnutrition in context of acute illness/injury: nutrition input appreciated.  DVT prophylaxis: (Lovenox) Code Status: full code.  Family Communication:none at bedside.  Disposition Plan: pending further evaluation.    Consultants:   cardiology   Procedures:  None/   Antimicrobials: vancomycin , cefepime and  flagyl.    Subjective:  Remain tachycardia (afib/rvr), bp low normal, no fever no n/v, but persistent diarrhea and gas pain in her abdomen She continue to compliant about DOE , though denies chest pain, no cough, no edema in lower extremity  Objective: Vitals:   12/30/15 0319 12/30/15 0400 12/30/15 0600 12/30/15 0845  BP:  115/83 116/77   Pulse:      Resp:  17 20   Temp: 97.5 F (36.4 C)   97.9 F (36.6 C)  TempSrc: Oral   Oral  SpO2:      Weight:      Height:        Intake/Output  Summary (Last 24 hours) at 12/30/15 0848 Last data filed at 12/30/15 0532  Gross per 24 hour  Intake              710 ml  Output                0 ml  Net              710 ml   Filed Weights   12/27/15 2007 12/28/15 1410  Weight: 63.5 kg (140 lb) 65.2 kg (143 lb 11.8 oz)    Examination:  General exam: Appears calm and comfortable  Respiratory system: Clear to auscultation. Respiratory effort normal. Cardiovascular system: IRRR. No pedal edema. Gastrointestinal system: Abdomen is nondistended, soft and nontender. No organomegaly or masses felt. Normal bowel sounds heard. Central nervous system: Alert and oriented. No focal neurological deficits. Extremities: Symmetric 5 x 5 power. Skin: No rashes, lesions or ulcers     Data Reviewed: I have personally reviewed following labs and imaging studies  CBC:  Recent Labs Lab 12/27/15 2025 12/28/15 0428 12/29/15 0334 12/30/15 0326  WBC 20.6* 11.4* 12.3* 7.2  NEUTROABS 17.1*  --   --  5.1  HGB 14.9 11.8* 11.9* 10.8*  HCT 43.8 36.5 36.9 33.8*  MCV 89.8 93.8 94.6 93.9  PLT 261 188 206 [id number]   Basic Metabolic Panel:  Recent Labs Lab 12/27/15 2025 12/28/15 0428 12/29/15 0334 12/30/15 0326  NA 134* 140 142 140  K 3.9 3.6 3.4* 3.5  CL 99* 112* 111 111  CO2 26 23 24 24   GLUCOSE 137* 118* 122* 103*  BUN 9 9 9 10   CREATININE 0.81 0.61 0.71 0.67  CALCIUM 9.1 8.0* 8.8* 8.8*  MG  --   --   --  2.0   GFR: Estimated Creatinine Clearance: 56 mL/min (by C-G formula based on SCr of 0.67 mg/dL). Liver Function Tests:  Recent Labs Lab 12/27/15 2025  AST 25  ALT 15  ALKPHOS 76  BILITOT 1.0  PROT 7.8  ALBUMIN 4.0   No results for input(s): LIPASE, AMYLASE in the last 168 hours. No results for input(s): AMMONIA in the last 168 hours. Coagulation Profile:  Recent Labs Lab 12/27/15 2025  INR 1.23   Cardiac Enzymes: No results for input(s): CKTOTAL, CKMB, CKMBINDEX, TROPONINI in the last 168 hours. BNP (last 3  results)  Recent Labs  12/13/15 1704  PROBNP 215.0*   HbA1C: No results for input(s): HGBA1C in the last 72 hours. CBG:  Recent Labs Lab 12/28/15 0822 12/29/15 0746  GLUCAP 111* 127*   Lipid Profile: No results for input(s): CHOL, HDL, LDLCALC, TRIG, CHOLHDL, LDLDIRECT in the last 72 hours. Thyroid Function Tests: No results for input(s): TSH, T4TOTAL, FREET4, T3FREE, THYROIDAB in the last 72 hours. Anemia Panel: No results for input(s): VITAMINB12, FOLATE, FERRITIN, TIBC, IRON, RETICCTPCT in the last 72 hours. Sepsis Labs:  Recent Labs Lab 12/27/15 2038 12/27/15 2322 12/29/15 0334  PROCALCITON  --   --  0.13  LATICACIDVEN 2.07* 0.77  --     Recent Results (from the past 240 hour(s))  Culture, blood (Routine x 2)     Status: None (Preliminary result)   Collection Time: 12/27/15  8:26 PM  Result Value Ref  Range Status   Specimen Description LEFT ANTECUBITAL BLOOD  Final   Special Requests BOTTLES DRAWN AEROBIC AND ANAEROBIC 5CC  Final   Culture   Final    NO GROWTH 2 DAYS Performed at [facility]    Report Status PENDING  Incomplete  Culture, blood (Routine x 2)     Status: None (Preliminary result)   Collection Time: 12/27/15  8:53 PM  Result Value Ref Range Status   Specimen Description BLOOD RIGHT FOREARM  Final   Special Requests BOTTLES DRAWN AEROBIC AND ANAEROBIC 5CC  Final   Culture   Final    NO GROWTH 2 DAYS Performed at [facility]    Report Status PENDING  Incomplete  Urine culture     Status: None   Collection Time: 12/27/15 11:23 PM  Result Value Ref Range Status   Specimen Description URINE, CATHETERIZED  Final   Special Requests NONE  Final   Culture NO GROWTH Performed at [facility]   Final   Report Status 12/29/2015 FINAL  Final  C difficile quick scan w PCR reflex     Status: Abnormal   Collection Time: 12/28/15  8:07 AM  Result Value Ref Range Status   C Diff antigen POSITIVE (A) NEGATIVE Final   C Diff  toxin NEGATIVE NEGATIVE Final   C Diff interpretation Results are indeterminate. See PCR results.  Final  Gastrointestinal Panel by PCR , Stool     Status: None   Collection Time: 12/28/15  8:07 AM  Result Value Ref Range Status   Campylobacter species NOT DETECTED NOT DETECTED Final   Plesimonas shigelloides NOT DETECTED NOT DETECTED Final   Salmonella species NOT DETECTED NOT DETECTED Final   Yersinia enterocolitica NOT DETECTED NOT DETECTED Final   Vibrio species NOT DETECTED NOT DETECTED Final   Vibrio cholerae NOT DETECTED NOT DETECTED Final   Enteroaggregative E coli (EAEC) NOT DETECTED NOT DETECTED Final   Enteropathogenic E coli (EPEC) NOT DETECTED NOT DETECTED Final   Enterotoxigenic E coli (ETEC) NOT DETECTED NOT DETECTED Final   Shiga like toxin producing E coli (STEC) NOT DETECTED NOT DETECTED Final   Shigella/Enteroinvasive E coli (EIEC) NOT DETECTED NOT DETECTED Final   Cryptosporidium NOT DETECTED NOT DETECTED Final   Cyclospora cayetanensis NOT DETECTED NOT DETECTED Final   Entamoeba histolytica NOT DETECTED NOT DETECTED Final   Giardia lamblia NOT DETECTED NOT DETECTED Final   Adenovirus F40/41 NOT DETECTED NOT DETECTED Final   Astrovirus NOT DETECTED NOT DETECTED Final   Norovirus GI/GII NOT DETECTED NOT DETECTED Final   Rotavirus A NOT DETECTED NOT DETECTED Final   Sapovirus (I, II, IV, and V) NOT DETECTED NOT DETECTED Final  Clostridium Difficile by PCR     Status: None   Collection Time: 12/28/15  8:07 AM  Result Value Ref Range Status   Toxigenic C Difficile by pcr NEGATIVE NEGATIVE Final    Comment: Patient is colonized with non toxigenic C. difficile. May not need treatment unless significant symptoms are present. Performed at [facility]          Radiology Studies: Ct Abdomen Pelvis W Contrast  Result Date: 12/29/2015 CLINICAL DATA:  Chronic AFib with worsening fatigue in diarrhea EXAM: CT ABDOMEN AND PELVIS WITH CONTRAST TECHNIQUE:  Multidetector CT imaging of the abdomen and pelvis was performed using the standard protocol following bolus administration of intravenous contrast. CONTRAST:  132mL ISOVUE-300 IOPAMIDOL (ISOVUE-300) INJECTION 61% COMPARISON:  05/25/2015 ultrasound, CT 04/08/2010 FINDINGS: Lower chest:  Small bilateral right greater than left pleural effusion. Passive atelectasis in the bilateral lower lobes. 2.5 cm cyst or bleb in the right lower lobe. Small bleb posterior left lower lobe. There is cardiomegaly with biatrial enlargement. Small pericardial effusion. Small nodular focus right middle lobe, possibly inflammatory or related to atelectasis. Hepatobiliary: Mild diffuse decreased density of the hepatic parenchyma consistent with fatty change. No focal hepatic abnormality. The gallbladder is contracted and filled with hyperdense sludge or calcified stones. No biliary dilatation. Pancreas: Unremarkable. No pancreatic ductal dilatation or surrounding inflammatory changes. Spleen: Normal in size without focal abnormality. Adrenals/Urinary Tract: Adrenal glands are within normal limits. Subcentimeter cortical hypodense lesions in the kidneys, too small to further characterize. No hydronephrosis. Bladder is unremarkable Stomach/Bowel: Stomach is nonenlarged. No dilated small bowel to suggest an obstruction. There is no colon wall thickening. The appendix is not well identified but no right lower quadrant inflammation is seen. Vascular/Lymphatic: Aortic atherosclerosis. No enlarged abdominal or pelvic lymph nodes. Reproductive: Status post hysterectomy. No adnexal masses. Other: Trace amount of fluid in the pelvis.  No free air. Musculoskeletal: Degenerative changes of the spine. Probable hemangioma in T11. IMPRESSION: 1. No evidence for bowel obstruction. No significant colon wall thickening or colon inflammation. 2. Small bilateral pleural effusions, right greater than left. 3. Cardiomegaly with biatrial enlargement. Small  pericardial effusion. 4. Mild fatty infiltration of the liver 5. Contracted gallbladder filled with calcified stones Electronically Signed   By: Donavan Foil M.D.   On: 12/29/2015 21:02        Scheduled Meds: . ceFEPime (MAXIPIME) IV  1 g Intravenous Q8H  . cholecalciferol  2,000 Units Oral Daily  . enoxaparin (LOVENOX) injection  40 mg Subcutaneous Q24H  . folic acid  2 mg Oral Daily  . hydrocortisone sod succinate (SOLU-CORTEF) inj  25 mg Intravenous Q6H  . metoprolol succinate  100 mg Oral BID  . metroNIDAZOLE  500 mg Oral Q8H  . multivitamin with minerals  1 tablet Oral Daily  . potassium chloride  40 mEq Oral Once  . pravastatin  20 mg Oral QHS  . sodium chloride flush  3 mL Intravenous Q12H  . vancomycin  500 mg Intravenous Q12H  . verapamil  40 mg Oral Q12H   Continuous Infusions:    LOS: 3 days    Time spent: 35 minutes    Markeise Mathews, MD PhD [facility] Pager [phone]  If 7PM-7AM, please contact night-coverage [email or web address] Password [facility] 12/30/2015, 8:48 AM

## 2015-12-30 NOTE — Progress Notes (Signed)
CSW spoke briefly with patient regarding discharge plans. Patient lives in a single family home with her spouse. Patients daughter helps take care of her spouse who has a hard time walking. Patient requested more resources at home. CSW will contact CM.   Kingsley Spittle, LCSWA Clinical Social Worker [phone]

## 2015-12-31 ENCOUNTER — Other Ambulatory Visit: Payer: Self-pay

## 2015-12-31 DIAGNOSIS — I480 Paroxysmal atrial fibrillation: Secondary | ICD-10-CM

## 2015-12-31 LAB — BASIC METABOLIC PANEL
ANION GAP: 6 (ref 5–15)
BUN: 11 mg/dL (ref 6–20)
CO2: 24 mmol/L (ref 22–32)
Calcium: 8.5 mg/dL — ABNORMAL LOW (ref 8.9–10.3)
Chloride: 110 mmol/L (ref 101–111)
Creatinine, Ser: 0.67 mg/dL (ref 0.44–1.00)
Glucose, Bld: 107 mg/dL — ABNORMAL HIGH (ref 65–99)
POTASSIUM: 3.7 mmol/L (ref 3.5–5.1)
SODIUM: 140 mmol/L (ref 135–145)

## 2015-12-31 LAB — HEPATIC FUNCTION PANEL
ALT: 16 U/L (ref 14–54)
AST: 17 U/L (ref 15–41)
Albumin: 2.9 g/dL — ABNORMAL LOW (ref 3.5–5.0)
Alkaline Phosphatase: 55 U/L (ref 38–126)
Total Bilirubin: 0.6 mg/dL (ref 0.3–1.2)
Total Protein: 5.7 g/dL — ABNORMAL LOW (ref 6.5–8.1)

## 2015-12-31 LAB — CBC
HCT: 33.7 % — ABNORMAL LOW (ref 36.0–46.0)
Hemoglobin: 10.9 g/dL — ABNORMAL LOW (ref 12.0–15.0)
MCH: 29.9 pg (ref 26.0–34.0)
MCHC: 32.3 g/dL (ref 30.0–36.0)
MCV: 92.3 fL (ref 78.0–100.0)
PLATELETS: 217 10*3/uL (ref 150–400)
RBC: 3.65 MIL/uL — AB (ref 3.87–5.11)
RDW: 15.8 % — ABNORMAL HIGH (ref 11.5–15.5)
WBC: 5.9 10*3/uL (ref 4.0–10.5)

## 2015-12-31 LAB — PROCALCITONIN

## 2015-12-31 LAB — GLUCOSE, CAPILLARY: Glucose-Capillary: 171 mg/dL — ABNORMAL HIGH (ref 65–99)

## 2015-12-31 LAB — MAGNESIUM: MAGNESIUM: 1.9 mg/dL (ref 1.7–2.4)

## 2015-12-31 MED ORDER — RANITIDINE HCL 150 MG PO TABS: 150.0000 mg | ORAL_TABLET | Freq: Every day | ORAL | 0 refills | Status: DC

## 2015-12-31 MED ORDER — HYDROCORTISONE NA SUCCINATE PF 100 MG IJ SOLR
25.0000 mg | Freq: Every day | INTRAMUSCULAR | Status: DC
  Administered 2016-01-01: 25 mg via INTRAVENOUS
  Filled 2015-12-31: qty 2

## 2015-12-31 MED ORDER — VERAPAMIL HCL 80 MG PO TABS
80.0000 mg | ORAL_TABLET | Freq: Two times a day (BID) | ORAL | Status: DC
  Administered 2015-12-31 – 2016-01-01 (×3): 80 mg via ORAL
  Filled 2015-12-31 (×4): qty 1

## 2015-12-31 MED ORDER — METRONIDAZOLE 500 MG PO TABS: 500.0000 mg | ORAL_TABLET | Freq: Three times a day (TID) | ORAL | 0 refills | Status: DC

## 2015-12-31 MED ORDER — VALACYCLOVIR HCL 500 MG PO TABS
2000.0000 mg | ORAL_TABLET | Freq: Two times a day (BID) | ORAL | Status: AC
  Administered 2015-12-31 (×2): 2000 mg via ORAL
  Filled 2015-12-31 (×2): qty 4

## 2015-12-31 MED ORDER — VERAPAMIL HCL 40 MG PO TABS
40.0000 mg | ORAL_TABLET | Freq: Once | ORAL | Status: AC
  Administered 2015-12-31: 40 mg via ORAL
  Filled 2015-12-31: qty 1

## 2015-12-31 NOTE — Progress Notes (Addendum)
PROGRESS NOTE    [name]  [id number] DOB: 09-Sep-1940 DOA: 12/27/2015 PCP: Leeroy Cha, MD    Brief Narrative:[name] is a 75 y.o. female with medical history significant of hypertension, hyperlipidemia, GERD, rheumatoid arthritis, pulmonary hypertension, atrial fibrillation, who presents with fever and diarrhea. Her Bm's have improved. Stool work up shows  cdiff antigen positive, but toxin negative. Her fever resolved. She was admitted to step down for evaluation.   Assessment & Plan:   Principal Problem:   Sepsis ([place]) Active Problems:   Atrial fibrillation (HCC)   Hypertension   Hyperlipidemia   Rheumatoid arthritis (HCC)   Exudative pleural effusion   Long QT interval   Pericardial effusion   GERD (gastroesophageal reflux disease)   Diarrhea   Fall   Chronic systolic CHF (congestive heart failure) (HCC)   Malnutrition of moderate degree   Immunosuppressed status ([place])   Sepsis in an heavily immunosuppressed individual: with questionable cdiff (+gi symptom) Patient is presented with fever of 103, tachycardia, leukocytosis, lactic acidosis, brief hypotension, sbp in 80's initially, with abdominal pain, n/v/d CXR does not show pneumonia. UA negative. Blood culture no growth, urine culture no growth, Stool work up shows c diff antigen positive. Toxin negative. Gi pcr panel negative. She is admitted to stepdown and started on vanc/cefepime/flagyl/ivf /stress dose steroid on admission, home meds methotraxate, enbrel held  I donot see procalcitonin being ordered or being processed on admission, procalcitonin on 12/13 0.13 after two days of abx and patient with clinical improvement, repeat procalcitonin on 12/15 <0.10  fever has resolved, tachycardia and bp improved. Lactic acid normalized.  Patient has persistent abdominal pain and diarrhea initially, CT ab /pel with gallstone, but no cholecystitis, no colitis.  Ab pain and diarrhea has improved on  12/15, will continue flagyl to finish total of 14day treatment for presumed c diff diarrhea.  Pericardial effusion:  Colchicine was ordered on admission Patient refuse take colchicine due to c/o rash on her chest after started on colchicine, she also thinks colchicine gives her diarrhea She does report more sob, but denies chest pain, cardiology consulted, oked with d/c cholchicine. Repeat echo with minimal pericardial effusion.   Atrial fibrillation/RVR:   Rate not well controlled on metolprolol and verapamil Anticoagulation on hold after recent pericardiocentesis.  Hopefully with treating sepsis, heart rate will improve. Cardiology consulted, betablocker dose increased, heart rate better on 12/15   QTc prolongation  QTc 570 on admission, Qtc has normalized on 12/15   Rheumatoid arthritis.  On methoraxate, enbrel and daily prednisone methotraxate and embrel held,  She is on stress dose steroids since admitted, will need to taper to baseline   Hypertension:  With brief hypotension on admission , she was put on stress dose steroids bp now stable on BB and CCB, taper stress dose steroids to baseline dose. Lasix held since admission, resumed on 12/14   GERD:  Stable.   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   Non-severe (moderate) malnutrition in context of acute illness/injury: nutrition input appreciated.  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  Family Communication:none at bedside.  Disposition Plan: home, likely on 12/15   Consultants:   cardiology   Procedures:  None/   Antimicrobials:   vancomycin , cefepime from admission to 12/14  Doxycycline from 12/14  Flagyl from admission  Subjective: Feeling a lot better this am, heart rate and blood pressure stable, no  fever, diarrhea has slowed down, abdominal pain has much improved She report started to having lip sores  Objective: Vitals:   12/30/15 1800 12/30/15 1852 12/30/15 2025 12/31/15 0500  BP: (!) 116/99 127/79 131/85 124/72  Pulse:  (!) 119 80 88  Resp: 20 20 18 16   Temp:  98.1 F (36.7 C) 98.3 F (36.8 C) 98 F (36.7 C)  TempSrc:  Oral Oral Oral  SpO2: 99% 100% 98% 99%  Weight:      Height:        Intake/Output Summary (Last 24 hours) at 12/31/15 0818 Last data filed at 12/30/15 2143  Gross per 24 hour  Intake              393 ml  Output              125 ml  Net              268 ml   Filed Weights   12/27/15 2007 12/28/15 1410  Weight: 63.5 kg (140 lb) 65.2 kg (143 lb 11.8 oz)    Examination:  General exam: Appears calm and comfortable , herpes labialis upper lip Respiratory system: Clear to auscultation. Respiratory effort normal. Cardiovascular system: IRRR. No pedal edema. Gastrointestinal system: Abdomen is nondistended, soft and nontender. No organomegaly or masses felt. Normal bowel sounds heard. Central nervous system: Alert and oriented. No focal neurological deficits. Extremities: Symmetric 5 x 5 power. Skin: No rashes, lesions or ulcers     Data Reviewed: I have personally reviewed following labs and imaging studies  CBC:  Recent Labs Lab 12/27/15 2025 12/28/15 0428 12/29/15 0334 12/30/15 0326 12/31/15 0421  WBC 20.6* 11.4* 12.3* 7.2 5.9  NEUTROABS 17.1*  --   --  5.1  --   HGB 14.9 11.8* 11.9* 10.8* 10.9*  HCT 43.8 36.5 36.9 33.8* 33.7*  MCV 89.8 93.8 94.6 93.9 92.3  PLT 261 188 206 191 [id number]   Basic Metabolic Panel:  Recent Labs Lab 12/27/15 2025 12/28/15 0428 12/29/15 0334 12/30/15 0326 12/31/15 0421  NA 134* 140 142 140 140  K 3.9 3.6 3.4* 3.5 3.7  CL 99* 112* 111 111 110  CO2 26 23 24 24 24   GLUCOSE 137* 118* 122* 103* 107*  BUN 9 9 9 10 11   CREATININE 0.81 0.61 0.71 0.67 0.67  CALCIUM 9.1 8.0* 8.8* 8.8* 8.5*  MG  --   --   --   2.0 1.9   GFR: Estimated Creatinine Clearance: 56 mL/min (by C-G formula based on SCr of 0.67 mg/dL). Liver Function Tests:  Recent Labs Lab 12/27/15 2025 12/31/15 0421  AST 25 17  ALT 15 16  ALKPHOS 76 55  BILITOT 1.0 0.6  PROT 7.8 5.7*  ALBUMIN 4.0 2.9*   No results for input(s): LIPASE, AMYLASE in the last 168 hours. No results for input(s): AMMONIA in the last 168 hours. Coagulation Profile:  Recent Labs Lab 12/27/15 2025  INR 1.23   Cardiac Enzymes: No results for input(s): CKTOTAL, CKMB, CKMBINDEX, TROPONINI in the last 168 hours. BNP (last 3 results)  Recent Labs  12/13/15 1704  PROBNP 215.0*   HbA1C: No results for input(s): HGBA1C in the last 72 hours. CBG:  Recent Labs Lab 12/28/15 0822 12/29/15 0746 12/30/15 0844 12/31/15 0812  GLUCAP 111* 127* 102* 171*   Lipid Profile: No results for input(s): CHOL, HDL, LDLCALC, TRIG, CHOLHDL, LDLDIRECT in the  last 72 hours. Thyroid Function Tests: No results for input(s): TSH, T4TOTAL, FREET4, T3FREE, THYROIDAB in the last 72 hours. Anemia Panel: No results for input(s): VITAMINB12, FOLATE, FERRITIN, TIBC, IRON, RETICCTPCT in the last 72 hours. Sepsis Labs:  Recent Labs Lab 12/27/15 2038 12/27/15 2322 12/29/15 0334 12/31/15 0421  PROCALCITON  --   --  0.13 <0.10  LATICACIDVEN 2.07* 0.77  --   --     Recent Results (from the past 240 hour(s))  Culture, blood (Routine x 2)     Status: None (Preliminary result)   Collection Time: 12/27/15  8:26 PM  Result Value Ref Range Status   Specimen Description LEFT ANTECUBITAL BLOOD  Final   Special Requests BOTTLES DRAWN AEROBIC AND ANAEROBIC 5CC  Final   Culture   Final    NO GROWTH 3 DAYS Performed at [facility]    Report Status PENDING  Incomplete  Culture, blood (Routine x 2)     Status: None (Preliminary result)   Collection Time: 12/27/15  8:53 PM  Result Value Ref Range Status   Specimen Description BLOOD RIGHT FOREARM  Final   Special  Requests BOTTLES DRAWN AEROBIC AND ANAEROBIC 5CC  Final   Culture   Final    NO GROWTH 3 DAYS Performed at [facility]    Report Status PENDING  Incomplete  Urine culture     Status: None   Collection Time: 12/27/15 11:23 PM  Result Value Ref Range Status   Specimen Description URINE, CATHETERIZED  Final   Special Requests NONE  Final   Culture NO GROWTH Performed at [facility]   Final   Report Status 12/29/2015 FINAL  Final  C difficile quick scan w PCR reflex     Status: Abnormal   Collection Time: 12/28/15  8:07 AM  Result Value Ref Range Status   C Diff antigen POSITIVE (A) NEGATIVE Final   C Diff toxin NEGATIVE NEGATIVE Final   C Diff interpretation Results are indeterminate. See PCR results.  Final  Gastrointestinal Panel by PCR , Stool     Status: None   Collection Time: 12/28/15  8:07 AM  Result Value Ref Range Status   Campylobacter species NOT DETECTED NOT DETECTED Final   Plesimonas shigelloides NOT DETECTED NOT DETECTED Final   Salmonella species NOT DETECTED NOT DETECTED Final   Yersinia enterocolitica NOT DETECTED NOT DETECTED Final   Vibrio species NOT DETECTED NOT DETECTED Final   Vibrio cholerae NOT DETECTED NOT DETECTED Final   Enteroaggregative E coli (EAEC) NOT DETECTED NOT DETECTED Final   Enteropathogenic E coli (EPEC) NOT DETECTED NOT DETECTED Final   Enterotoxigenic E coli (ETEC) NOT DETECTED NOT DETECTED Final   Shiga like toxin producing E coli (STEC) NOT DETECTED NOT DETECTED Final   Shigella/Enteroinvasive E coli (EIEC) NOT DETECTED NOT DETECTED Final   Cryptosporidium NOT DETECTED NOT DETECTED Final   Cyclospora cayetanensis NOT DETECTED NOT DETECTED Final   Entamoeba histolytica NOT DETECTED NOT DETECTED Final   Giardia lamblia NOT DETECTED NOT DETECTED Final   Adenovirus F40/41 NOT DETECTED NOT DETECTED Final   Astrovirus NOT DETECTED NOT DETECTED Final   Norovirus GI/GII NOT DETECTED NOT DETECTED Final   Rotavirus A NOT  DETECTED NOT DETECTED Final   Sapovirus (I, II, IV, and V) NOT DETECTED NOT DETECTED Final  Clostridium Difficile by PCR     Status: None   Collection Time: 12/28/15  8:07 AM  Result Value Ref Range Status   Toxigenic C  Difficile by pcr NEGATIVE NEGATIVE Final    Comment: Patient is colonized with non toxigenic C. difficile. May not need treatment unless significant symptoms are present. Performed at [facility]   MRSA PCR Screening     Status: None   Collection Time: 12/30/15  1:10 PM  Result Value Ref Range Status   MRSA by PCR NEGATIVE NEGATIVE Final    Comment:        The GeneXpert MRSA Assay (FDA approved for NASAL specimens only), is one component of a comprehensive MRSA colonization surveillance program. It is not intended to diagnose MRSA infection nor to guide or monitor treatment for MRSA infections.          Radiology Studies: Ct Abdomen Pelvis W Contrast  Result Date: 12/29/2015 CLINICAL DATA:  Chronic AFib with worsening fatigue in diarrhea EXAM: CT ABDOMEN AND PELVIS WITH CONTRAST TECHNIQUE: Multidetector CT imaging of the abdomen and pelvis was performed using the standard protocol following bolus administration of intravenous contrast. CONTRAST:  182mL ISOVUE-300 IOPAMIDOL (ISOVUE-300) INJECTION 61% COMPARISON:  05/25/2015 ultrasound, CT 04/08/2010 FINDINGS: Lower chest: Small bilateral right greater than left pleural effusion. Passive atelectasis in the bilateral lower lobes. 2.5 cm cyst or bleb in the right lower lobe. Small bleb posterior left lower lobe. There is cardiomegaly with biatrial enlargement. Small pericardial effusion. Small nodular focus right middle lobe, possibly inflammatory or related to atelectasis. Hepatobiliary: Mild diffuse decreased density of the hepatic parenchyma consistent with fatty change. No focal hepatic abnormality. The gallbladder is contracted and filled with hyperdense sludge or calcified stones. No biliary dilatation.  Pancreas: Unremarkable. No pancreatic ductal dilatation or surrounding inflammatory changes. Spleen: Normal in size without focal abnormality. Adrenals/Urinary Tract: Adrenal glands are within normal limits. Subcentimeter cortical hypodense lesions in the kidneys, too small to further characterize. No hydronephrosis. Bladder is unremarkable Stomach/Bowel: Stomach is nonenlarged. No dilated small bowel to suggest an obstruction. There is no colon wall thickening. The appendix is not well identified but no right lower quadrant inflammation is seen. Vascular/Lymphatic: Aortic atherosclerosis. No enlarged abdominal or pelvic lymph nodes. Reproductive: Status post hysterectomy. No adnexal masses. Other: Trace amount of fluid in the pelvis.  No free air. Musculoskeletal: Degenerative changes of the spine. Probable hemangioma in T11. IMPRESSION: 1. No evidence for bowel obstruction. No significant colon wall thickening or colon inflammation. 2. Small bilateral pleural effusions, right greater than left. 3. Cardiomegaly with biatrial enlargement. Small pericardial effusion. 4. Mild fatty infiltration of the liver 5. Contracted gallbladder filled with calcified stones Electronically Signed   By: Donavan Foil M.D.   On: 12/29/2015 21:02        Scheduled Meds: . cholecalciferol  2,000 Units Oral Daily  . doxycycline  100 mg Oral Q12H  . enoxaparin (LOVENOX) injection  40 mg Subcutaneous Q24H  . folic acid  2 mg Oral Daily  . furosemide  40 mg Oral QPM  . hydrocortisone sod succinate (SOLU-CORTEF) inj  25 mg Intravenous Q12H  . metoprolol succinate  100 mg Oral BID  . metroNIDAZOLE  500 mg Oral Q8H  . multivitamin with minerals  1 tablet Oral Daily  . pravastatin  20 mg Oral QHS  . sodium chloride flush  3 mL Intravenous Q12H  . verapamil  40 mg Oral Q12H   Continuous Infusions:    LOS: 4 days    Time spent: 35 minutes    Amyia Lodwick, MD PhD [facility] Pager [phone]  If 7PM-7AM,  please contact night-coverage [email or web address] Password [facility] 12/31/2015,  8:18 AM

## 2015-12-31 NOTE — Consult Note (Signed)
   [facility] Inpatient Consult   12/31/2015  TROYCE MEACHEM 1940-06-11 [id number]    Patient screened for [facility] Management services. Went to bedside to offer and explain [facility] Care Management program with patient. Mrs. Aro pleasantly declines [facility] Care Management follow up. MD at bedside aware. Mrs. Lunt accepted [facility] Management brochure with contact information to call in future if changes mind. Will make inpatient RNCM aware that patient declined [facility] Management program services.  Marthenia Rolling, MSN-Ed, RN,BSN [facility] Liaison [phone]

## 2015-12-31 NOTE — Care Management Important Message (Signed)
Important Message  Patient Details  Name: BELLARAE RELEFORD MRN: [id number] Date of Birth: 11-21-1940   Medicare Important Message Given:  Yes    Camillo Flaming 12/31/2015, 11:19 AMImportant Message  Patient Details  Name: LAIANA BAIERL MRN: [id number] Date of Birth: 20-Nov-1940   Medicare Important Message Given:  Yes    Camillo Flaming 12/31/2015, 11:19 AM

## 2015-12-31 NOTE — Progress Notes (Signed)
Patient Name: [name] Date of Encounter: 12/31/2015  Primary Cardiologist: Dr. Marinda Elk Problem List     Principal Problem:   Sepsis [facility]) Active Problems:   Atrial fibrillation ([place])   Hypertension   Hyperlipidemia   Rheumatoid arthritis ([place])   Exudative pleural effusion   Long QT interval   Pericardial effusion   GERD (gastroesophageal reflux disease)   Diarrhea   Fall   Chronic systolic CHF (congestive heart failure) (HCC)   Malnutrition of moderate degree   Immunosuppressed status (H[facility])    Subjective   HR peaking into the 140's overnight. Denies any palpitations at this time. No chest discomfort or dyspnea.   Inpatient Medications    Scheduled Meds: . cholecalciferol  2,000 Units Oral Daily  . doxycycline  100 mg Oral Q12H  . enoxaparin (LOVENOX) injection  40 mg Subcutaneous Q24H  . folic acid  2 mg Oral Daily  . furosemide  40 mg Oral QPM  . [START ON 01/01/2016] hydrocortisone sod succinate (SOLU-CORTEF) inj  25 mg Intravenous Daily  . metoprolol succinate  100 mg Oral BID  . metroNIDAZOLE  500 mg Oral Q8H  . multivitamin with minerals  1 tablet Oral Daily  . pravastatin  20 mg Oral QHS  . sodium chloride flush  3 mL Intravenous Q12H  . valACYclovir  2,000 mg Oral BID  . verapamil  40 mg Oral Q12H   Continuous Infusions:  PRN Meds: acetaminophen, fluticasone, guaiFENesin, hydrOXYzine, iopamidol   Vital Signs    Vitals:   12/30/15 1800 12/30/15 1852 12/30/15 2025 12/31/15 0500  BP: (!) 116/99 127/79 131/85 124/72  Pulse:  (!) 119 80 88  Resp: 20 20 18 16   Temp:  98.1 F (36.7 C) 98.3 F (36.8 C) 98 F (36.7 C)  TempSrc:  Oral Oral Oral  SpO2: 99% 100% 98% 99%  Weight:      Height:        Intake/Output Summary (Last 24 hours) at 12/31/15 1048 Last data filed at 12/31/15 1000  Gross per 24 hour  Intake              393 ml  Output                0 ml  Net              393 ml   Filed Weights   12/27/15 2007  12/28/15 1410  Weight: 140 lb (63.5 kg) 143 lb 11.8 oz (65.2 kg)    Physical Exam    GEN: Well nourished, well developed Caucasian female appearing in no acute distress.  HEENT: Grossly normal.  Neck: Supple, no JVD, carotid bruits, or masses. Cardiac: Irregularly irregular tachy, no murmurs, rubs, or gallops. No clubbing, cyanosis, edema.  Radials/DP/PT 2+ and equal bilaterally.  Respiratory:  Respirations regular and unlabored, clear to auscultation bilaterally. GI: Soft, nontender, nondistended, BS + x 4. MS: no deformity or atrophy. Skin: warm and dry, no rash. Neuro:  Strength and sensation are intact. Psych: AAOx3.  Normal affect.  Labs    CBC  Recent Labs  12/30/15 0326 12/31/15 0421  WBC 7.2 5.9  NEUTROABS 5.1  --   HGB 10.8* 10.9*  HCT 33.8* 33.7*  MCV 93.9 92.3  PLT 191 [id number]   Basic Metabolic Panel  Recent Labs  12/30/15 0326 12/31/15 0421  NA 140 140  K 3.5 3.7  CL 111 110  CO2 24 24  GLUCOSE 103* 107*  BUN 10  11  CREATININE 0.67 0.67  CALCIUM 8.8* 8.5*  MG 2.0 1.9   Liver Function Tests  Recent Labs  12/31/15 0421  AST 17  ALT 16  ALKPHOS 55  BILITOT 0.6  PROT 5.7*  ALBUMIN 2.9*    Telemetry    Atrial fibrillation,140's overnight.  - Personally Reviewed  ECG   No new tracings.   Radiology    Ct Abdomen Pelvis W Contrast  Result Date: 12/29/2015 CLINICAL DATA:  Chronic AFib with worsening fatigue in diarrhea EXAM: CT ABDOMEN AND PELVIS WITH CONTRAST TECHNIQUE: Multidetector CT imaging of the abdomen and pelvis was performed using the standard protocol following bolus administration of intravenous contrast. CONTRAST:  157mL ISOVUE-300 IOPAMIDOL (ISOVUE-300) INJECTION 61% COMPARISON:  05/25/2015 ultrasound, CT 04/08/2010 FINDINGS: Lower chest: Small bilateral right greater than left pleural effusion. Passive atelectasis in the bilateral lower lobes. 2.5 cm cyst or bleb in the right lower lobe. Small bleb posterior left lower lobe.  There is cardiomegaly with biatrial enlargement. Small pericardial effusion. Small nodular focus right middle lobe, possibly inflammatory or related to atelectasis. Hepatobiliary: Mild diffuse decreased density of the hepatic parenchyma consistent with fatty change. No focal hepatic abnormality. The gallbladder is contracted and filled with hyperdense sludge or calcified stones. No biliary dilatation. Pancreas: Unremarkable. No pancreatic ductal dilatation or surrounding inflammatory changes. Spleen: Normal in size without focal abnormality. Adrenals/Urinary Tract: Adrenal glands are within normal limits. Subcentimeter cortical hypodense lesions in the kidneys, too small to further characterize. No hydronephrosis. Bladder is unremarkable Stomach/Bowel: Stomach is nonenlarged. No dilated small bowel to suggest an obstruction. There is no colon wall thickening. The appendix is not well identified but no right lower quadrant inflammation is seen. Vascular/Lymphatic: Aortic atherosclerosis. No enlarged abdominal or pelvic lymph nodes. Reproductive: Status post hysterectomy. No adnexal masses. Other: Trace amount of fluid in the pelvis.  No free air. Musculoskeletal: Degenerative changes of the spine. Probable hemangioma in T11. IMPRESSION: 1. No evidence for bowel obstruction. No significant colon wall thickening or colon inflammation. 2. Small bilateral pleural effusions, right greater than left. 3. Cardiomegaly with biatrial enlargement. Small pericardial effusion. 4. Mild fatty infiltration of the liver 5. Contracted gallbladder filled with calcified stones Electronically Signed   By: Donavan Foil M.D.   On: 12/29/2015 21:02    Cardiac Studies   Echocardiogram: 12/30/2015 Study Conclusions  - Left ventricle: The cavity size was normal. Wall thickness was   increased in a pattern of mild LVH. Systolic function was normal.   The estimated ejection fraction was in the range of 50% to 55%.   Wall motion was  normal; there were no regional wall motion   abnormalities. - Mitral valve: There was mild regurgitation. - Left atrium: The atrium was moderately dilated. - Right ventricle: The cavity size was mildly dilated. Wall   thickness was normal. - Right atrium: The atrium was mildly dilated. - Atrial septum: No defect or patent foramen ovale was identified. - Pulmonary arteries: PA peak pressure: 34 mm Hg (S). - Pericardium, extracardiac: Small posterior pericardial effuson.  Patient Profile     75 y.o. female with past medical history of chronic atrial fibrillation (on Coumadin), HTN, HLD, RA and recent pericardial effusion (s/p pericardiocentesis and drain placement on 12/16/2015) admitted to [facility] on 12/27/2015 for evaluation of worsening fatigue and diarrhea. Diagnosed with SIRS. Cardiology consulted for atrial fibrillation with RVR.   Assessment & Plan    1. Chronic Atrial Fibrillation - has known history of chronic atrial fibrillation. HR  has remained elevated overnight, in the 140's.Increase her metoprolol from 75mg  BID to 100mg  BID.  Increased Verapamil to 80mg  BID.  - This patients CHA2DS2-VASc Score and unadjusted Ischemic Stroke Rate (% per year) is equal to 3.2 % stroke rate/year from a score of 3 (HTN, Age, Female). Coumadin currently held in the setting of her recent pericardiocentesis with bloody drainage with plans to restart this 2-4 weeks out from her procedure.   2. Recent Pericardial Effusion - recently admitted from 11/29 - 12/19/2015 for a large pericardial effusion having undergone a pericardiocentesis with drain placement on 12/16/2015 with removal of 573mL of dark sanguinous fluid. Fluid count showed 7800 WBC with no culture growth. Recommended to hold Coumadin for at least 2-4 weeks with plans to recheck a limited echo at that time prior to resuming Coumadin.  - unable to tolerate Colchicine secondary to diarrhea and rash. Currently on Enbrel, Methotrexate, and low-dose  Prednisone for her RA.  - repeat echo this admission shows "small posterior pericardial effuson. Should be of no clinical consequence. - Continue to hold coumadin   3. HTN - BP at 80/58 - 124/72 in the past 24 hours.  - continue with current medication regimen as outlined above.   4. HLD - continue statin therapy.   5. SIRS - presented with weakness and AMS. Was febrile, tachycardiac, tachypnea, hypotensive, and had an elevated lactic acid and WBC count. CXR with no acute findings and UA negative. Blood cultures show no growth to date.  - currently on broad spectrum antibiotics.  - WBC improved - per admitting team  Signed, Candee Furbish, MD  12/31/2015, 10:48 AM    Candee Furbish, MD

## 2015-12-31 NOTE — Evaluation (Signed)
Physical Therapy One Time Evaluation Patient Details Name: BRITTAN PAVON MRN: [id number] DOB: [birth year] Today's Date: 12/31/2015   History of Present Illness  75 y.o. female with medical history significant of hypertension, hyperlipidemia, GERD, rheumatoid arthritis, pulmonary hypertension, atrial fibrillation and admitted with sepsis, chronic afib, recent pericardial effusion  Clinical Impression  Patient evaluated by Physical Therapy with no further acute PT needs identified. All education has been completed and the patient has no further questions.  Pt mobilizing well.  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  Pt may need more home support for spouse however no further PT needs for herself (she is in agreement).See below for any follow-up Physical Therapy or equipment needs. PT is signing off. Thank you for this referral.     Follow Up Recommendations No PT follow up    Equipment Recommendations  None recommended by PT    Recommendations for Other Services       Precautions / Restrictions Precautions Precautions: None      Mobility  Bed Mobility Overal bed mobility: Modified Independent                Transfers Overall transfer level: Modified independent                  Ambulation/Gait Ambulation/Gait assistance: Supervision;Modified independent (Device/Increase time) Ambulation Distance (Feet): 260 Feet Assistive device: None Gait Pattern/deviations: WFL(Within Functional Limits)     General Gait Details: pt mobilizing well, reports occasional dizziness however states this is chronic due to afib, HR 100-123 during gait  Stairs            Wheelchair Mobility    Modified Rankin (Stroke Patients Only)       Balance Overall balance assessment: History of Falls (reports recent fall on incline near home after last hospital  d/c)                                           Pertinent Vitals/Pain Pain Assessment: No/denies pain    Home Living Family/patient expects to be discharged to:: Private residence Living Arrangements: Spouse/significant other   Type of Home: [place]: One level   Additional Comments: 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 pt had recent pericardial effusion and was told not to lift/push/pull, spouse has w/c, drop arm BSC, transfer board, hospital bed    Prior Function Level of Independence: Independent               Hand Dominance        Extremity/Trunk Assessment        Lower Extremity Assessment Lower Extremity Assessment: Overall WFL for tasks assessed       Communication   Communication: No difficulties  Cognition Arousal/Alertness: Awake/alert Behavior During Therapy: WFL for tasks assessed/performed Overall Cognitive Status: Within Functional Limits for tasks assessed                      General Comments      Exercises     Assessment/Plan    PT Assessment Patent does not need any further PT services  PT Problem List            PT Treatment Interventions      PT  Goals (Current goals can be found in the Care Plan section)  Acute Rehab PT Goals PT Goal Formulation: All assessment and education complete, DC therapy    Frequency     Barriers to discharge        Co-evaluation               End of Session   Activity Tolerance: Patient tolerated treatment well Patient left: in bed           Time: 1008-1030 PT Time Calculation (min) (ACUTE ONLY): 22 min   Charges:   PT Evaluation $PT Eval Low Complexity: 1 Procedure     PT G Codes:        Marisol Glazer,KATHrine E 12/31/2015, 12:20 PM Carmelia Bake, PT, DPT 12/31/2015 Pager: [phone]

## 2016-01-01 DIAGNOSIS — R9431 Abnormal electrocardiogram [ECG] [EKG]: Secondary | ICD-10-CM

## 2016-01-01 DIAGNOSIS — E876 Hypokalemia: Secondary | ICD-10-CM

## 2016-01-01 LAB — MAGNESIUM: MAGNESIUM: 1.8 mg/dL (ref 1.7–2.4)

## 2016-01-01 LAB — BASIC METABOLIC PANEL
ANION GAP: 8 (ref 5–15)
BUN: 10 mg/dL (ref 6–20)
CALCIUM: 8.7 mg/dL — AB (ref 8.9–10.3)
CO2: 29 mmol/L (ref 22–32)
Chloride: 107 mmol/L (ref 101–111)
Creatinine, Ser: 0.82 mg/dL (ref 0.44–1.00)
Glucose, Bld: 88 mg/dL (ref 65–99)
POTASSIUM: 3.3 mmol/L — AB (ref 3.5–5.1)
SODIUM: 144 mmol/L (ref 135–145)

## 2016-01-01 LAB — CULTURE, BLOOD (ROUTINE X 2)
Culture: NO GROWTH
Culture: NO GROWTH

## 2016-01-01 LAB — CBC WITH DIFFERENTIAL/PLATELET
BASOS ABS: 0 10*3/uL (ref 0.0–0.1)
BASOS PCT: 0 %
EOS ABS: 0.2 10*3/uL (ref 0.0–0.7)
EOS PCT: 4 %
HCT: 36.7 % (ref 36.0–46.0)
Hemoglobin: 11.8 g/dL — ABNORMAL LOW (ref 12.0–15.0)
LYMPHS PCT: 31 %
Lymphs Abs: 2.2 10*3/uL (ref 0.7–4.0)
MCH: 29.6 pg (ref 26.0–34.0)
MCHC: 32.2 g/dL (ref 30.0–36.0)
MCV: 92 fL (ref 78.0–100.0)
Monocytes Absolute: 0.5 10*3/uL (ref 0.1–1.0)
Monocytes Relative: 8 %
Neutro Abs: 3.9 10*3/uL (ref 1.7–7.7)
Neutrophils Relative %: 57 %
PLATELETS: 241 10*3/uL (ref 150–400)
RBC: 3.99 MIL/uL (ref 3.87–5.11)
RDW: 15.9 % — AB (ref 11.5–15.5)
WBC: 6.9 10*3/uL (ref 4.0–10.5)

## 2016-01-01 LAB — GLUCOSE, CAPILLARY: Glucose-Capillary: 108 mg/dL — ABNORMAL HIGH (ref 65–99)

## 2016-01-01 MED ORDER — POTASSIUM CHLORIDE CRYS ER 20 MEQ PO TBCR
40.0000 meq | EXTENDED_RELEASE_TABLET | ORAL | Status: AC
  Administered 2016-01-01 (×2): 40 meq via ORAL
  Filled 2016-01-01 (×2): qty 2

## 2016-01-01 MED ORDER — PREDNISONE 5 MG PO TABS
5.0000 mg | ORAL_TABLET | Freq: Every day | ORAL | Status: DC
  Administered 2016-01-02: 5 mg via ORAL
  Filled 2016-01-01: qty 1

## 2016-01-01 NOTE — Progress Notes (Signed)
PROGRESS NOTE    KAELE OPLINGER  [id number] DOB: 1940-02-13 DOA: 12/27/2015 PCP: Leeroy Cha, MD    Brief Narrative:[name] is a 75 y.o. female with medical history significant of hypertension, hyperlipidemia, GERD, rheumatoid arthritis, pulmonary hypertension, atrial fibrillation, who presents with fever and diarrhea. Her Bm's have improved. Stool work up shows  cdiff antigen positive, but toxin negative. Her fever resolved. She was admitted to step down for evaluation.   Assessment & Plan:   Principal Problem:   Sepsis ([place]) Active Problems:   Atrial fibrillation (HCC)   Hypertension   Hyperlipidemia   Rheumatoid arthritis (HCC)   Exudative pleural effusion   Long QT interval   Pericardial effusion   GERD (gastroesophageal reflux disease)   Diarrhea   Fall   Chronic systolic CHF (congestive heart failure) (HCC)   Malnutrition of moderate degree   Immunosuppressed status ([place])   Sepsis in an heavily immunosuppressed individual: with questionable cdiff (+gi symptom) Patient is presented with fever of 103, tachycardia, leukocytosis, lactic acidosis, brief hypotension, sbp in 80's initially, with abdominal pain, n/v/d CXR does not show pneumonia. UA negative. Blood culture no growth, urine culture no growth, Stool work up shows c diff antigen positive. Toxin negative. Gi pcr panel negative. She is admitted to stepdown and started on vanc/cefepime/flagyl/ivf /stress dose steroid on admission, home meds methotraxate, enbrel held  I donot see procalcitonin being ordered or being processed on admission, procalcitonin on 12/13 0.13 after two days of abx and patient with clinical improvement, repeat procalcitonin on 12/15 <0.10  fever has resolved, tachycardia and bp improved. Lactic acid normalized.  Patient has persistent abdominal pain and diarrhea initially, CT ab /pel with gallstone, but no cholecystitis, no colitis.  Ab pain and diarrhea has improved on  12/15, will continue flagyl to finish total of 14day treatment for presumed c diff diarrhea.  Pericardial effusion:  Colchicine was ordered on admission Patient refuse take colchicine due to c/o rash on her chest after started on colchicine, she also thinks colchicine gives her diarrhea She does report more sob, but denies chest pain, cardiology consulted, oked with d/c cholchicine. Repeat echo with minimal pericardial effusion.   Atrial fibrillation/RVR:   Anticoagulation on hold after recent pericardiocentesis. Rate not well controlled on metolprolol and verapamil  increasing metoprolol and verapamil, cardiology input appreciated.    QTc prolongation  QTc 570 on admission, Qtc has normalized on 12/15   Rheumatoid arthritis.  On methoraxate, enbrel and daily prednisone methotraxate and embrel held,  She is on stress dose steroids since admitted, taper to baseline   Hypertension:  With brief hypotension on admission , she was put on stress dose steroids bp now stable on BB and CCB, taper stress dose steroids to baseline dose. Lasix held since admission, resumed on 12/14  Hypokalemia: replace k  GERD:  Stable.   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   Non-severe (moderate) malnutrition in context of acute illness/injury: nutrition input appreciated.  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  Family Communication:none at bedside.  Disposition Plan: home once heart rate better controlled   Consultants:   cardiology   Procedures:  None/   Antimicrobials:   vancomycin , cefepime from admission to 12/14  Doxycycline from 12/14  Flagyl from admission   Subjective: Remain tachycardic,  c/o headache Lip  sore is better abdominal pain has resolved, stool start to form.    Objective: Vitals:   12/31/15 0500 12/31/15 2058 01/01/16 0500 01/01/16 0912  BP: 124/72 (!) 122/92  114/77  Pulse: 88 83  (!) 133  Resp: 16 16    Temp: 98 F (36.7 C) 97.5 F (36.4 C) 98.2 F (36.8 C)   TempSrc: Oral Oral Oral   SpO2: 99% 94% 100%   Weight:      Height:        Intake/Output Summary (Last 24 hours) at 01/01/16 0957 Last data filed at 12/31/15 1000  Gross per 24 hour  Intake              240 ml  Output                0 ml  Net              240 ml   Filed Weights   12/27/15 2007 12/28/15 1410  Weight: 63.5 kg (140 lb) 65.2 kg (143 lb 11.8 oz)    Examination:  General exam: Appears calm and comfortable , herpes labialis upper lip Respiratory system: Clear to auscultation. Respiratory effort normal. Cardiovascular system: IRRR. No pedal edema. Gastrointestinal system: Abdomen is nondistended, soft and nontender. No organomegaly or masses felt. Normal bowel sounds heard. Central nervous system: Alert and oriented. No focal neurological deficits. Extremities: Symmetric 5 x 5 power. Skin: No rashes, lesions or ulcers     Data Reviewed: I have personally reviewed following labs and imaging studies  CBC:  Recent Labs Lab 12/27/15 2025 12/28/15 0428 12/29/15 0334 12/30/15 0326 12/31/15 0421 01/01/16 0502  WBC 20.6* 11.4* 12.3* 7.2 5.9 6.9  NEUTROABS 17.1*  --   --  5.1  --  3.9  HGB 14.9 11.8* 11.9* 10.8* 10.9* 11.8*  HCT 43.8 36.5 36.9 33.8* 33.7* 36.7  MCV 89.8 93.8 94.6 93.9 92.3 92.0  PLT 261 188 206 191 217 [id number]   Basic Metabolic Panel:  Recent Labs Lab 12/28/15 0428 12/29/15 0334 12/30/15 0326 12/31/15 0421 01/01/16 0502  NA 140 142 140 140 144  K 3.6 3.4* 3.5 3.7 3.3*  CL 112* 111 111 110 107  CO2 23 24 24 24 29   GLUCOSE 118* 122* 103* 107* 88  BUN 9 9 10 11 10   CREATININE 0.61 0.71 0.67 0.67 0.82  CALCIUM 8.0* 8.8* 8.8* 8.5* 8.7*  MG  --   --  2.0 1.9 1.8   GFR: Estimated Creatinine Clearance: 54.6 mL/min (by C-G  formula based on SCr of 0.82 mg/dL). Liver Function Tests:  Recent Labs Lab 12/27/15 2025 12/31/15 0421  AST 25 17  ALT 15 16  ALKPHOS 76 55  BILITOT 1.0 0.6  PROT 7.8 5.7*  ALBUMIN 4.0 2.9*   No results for input(s): LIPASE, AMYLASE in the last 168 hours. No results for input(s): AMMONIA in the last 168 hours. Coagulation Profile:  Recent Labs Lab 12/27/15 2025  INR 1.23   Cardiac Enzymes: No results for input(s): CKTOTAL, CKMB, CKMBINDEX, TROPONINI in the last 168 hours. BNP (last 3 results)  Recent Labs  12/13/15 1704  PROBNP 215.0*   HbA1C: No results for input(s): HGBA1C in the last 72 hours. CBG:  Recent Labs Lab 12/28/15 0822 12/29/15 0746 12/30/15 0844 12/31/15 0812 01/01/16 0818  GLUCAP 111* 127* 102* 171* 108*   Lipid Profile: No results for input(s): CHOL, HDL, LDLCALC, TRIG, CHOLHDL, LDLDIRECT in the last 72 hours. Thyroid Function Tests: No results  for input(s): TSH, T4TOTAL, FREET4, T3FREE, THYROIDAB in the last 72 hours. Anemia Panel: No results for input(s): VITAMINB12, FOLATE, FERRITIN, TIBC, IRON, RETICCTPCT in the last 72 hours. Sepsis Labs:  Recent Labs Lab 12/27/15 2038 12/27/15 2322 12/29/15 0334 12/31/15 0421  PROCALCITON  --   --  0.13 <0.10  LATICACIDVEN 2.07* 0.77  --   --     Recent Results (from the past 240 hour(s))  Culture, blood (Routine x 2)     Status: None (Preliminary result)   Collection Time: 12/27/15  8:26 PM  Result Value Ref Range Status   Specimen Description LEFT ANTECUBITAL BLOOD  Final   Special Requests BOTTLES DRAWN AEROBIC AND ANAEROBIC 5CC  Final   Culture   Final    NO GROWTH 4 DAYS Performed at [facility]    Report Status PENDING  Incomplete  Culture, blood (Routine x 2)     Status: None (Preliminary result)   Collection Time: 12/27/15  8:53 PM  Result Value Ref Range Status   Specimen Description BLOOD RIGHT FOREARM  Final   Special Requests BOTTLES DRAWN AEROBIC AND ANAEROBIC  5CC  Final   Culture   Final    NO GROWTH 4 DAYS Performed at [facility] [place]    Report Status PENDING  Incomplete  Urine culture     Status: None   Collection Time: 12/27/15 11:23 PM  Result Value Ref Range Status   Specimen Description URINE, CATHETERIZED  Final   Special Requests NONE  Final   Culture NO GROWTH Performed at [facility]   Final   Report Status 12/29/2015 FINAL  Final  C difficile quick scan w PCR reflex     Status: Abnormal   Collection Time: 12/28/15  8:07 AM  Result Value Ref Range Status   C Diff antigen POSITIVE (A) NEGATIVE Final   C Diff toxin NEGATIVE NEGATIVE Final   C Diff interpretation Results are indeterminate. See PCR results.  Final  Gastrointestinal Panel by PCR , Stool     Status: None   Collection Time: 12/28/15  8:07 AM  Result Value Ref Range Status   Campylobacter species NOT DETECTED NOT DETECTED Final   Plesimonas shigelloides NOT DETECTED NOT DETECTED Final   Salmonella species NOT DETECTED NOT DETECTED Final   Yersinia enterocolitica NOT DETECTED NOT DETECTED Final   Vibrio species NOT DETECTED NOT DETECTED Final   Vibrio cholerae NOT DETECTED NOT DETECTED Final   Enteroaggregative E coli (EAEC) NOT DETECTED NOT DETECTED Final   Enteropathogenic E coli (EPEC) NOT DETECTED NOT DETECTED Final   Enterotoxigenic E coli (ETEC) NOT DETECTED NOT DETECTED Final   Shiga like toxin producing E coli (STEC) NOT DETECTED NOT DETECTED Final   Shigella/Enteroinvasive E coli (EIEC) NOT DETECTED NOT DETECTED Final   Cryptosporidium NOT DETECTED NOT DETECTED Final   Cyclospora cayetanensis NOT DETECTED NOT DETECTED Final   Entamoeba histolytica NOT DETECTED NOT DETECTED Final   Giardia lamblia NOT DETECTED NOT DETECTED Final   Adenovirus F40/41 NOT DETECTED NOT DETECTED Final   Astrovirus NOT DETECTED NOT DETECTED Final   Norovirus GI/GII NOT DETECTED NOT DETECTED Final   Rotavirus A NOT DETECTED NOT DETECTED Final   Sapovirus (I, II,  IV, and V) NOT DETECTED NOT DETECTED Final  Clostridium Difficile by PCR     Status: None   Collection Time: 12/28/15  8:07 AM  Result Value Ref Range Status   Toxigenic C Difficile by pcr NEGATIVE NEGATIVE Final  Comment: Patient is colonized with non toxigenic C. difficile. May not need treatment unless significant symptoms are present. Performed at [facility]   MRSA PCR Screening     Status: None   Collection Time: 12/30/15  1:10 PM  Result Value Ref Range Status   MRSA by PCR NEGATIVE NEGATIVE Final    Comment:        The GeneXpert MRSA Assay (FDA approved for NASAL specimens only), is one component of a comprehensive MRSA colonization surveillance program. It is not intended to diagnose MRSA infection nor to guide or monitor treatment for MRSA infections.          Radiology Studies: No results found.      Scheduled Meds: . cholecalciferol  2,000 Units Oral Daily  . doxycycline  100 mg Oral Q12H  . enoxaparin (LOVENOX) injection  40 mg Subcutaneous Q24H  . folic acid  2 mg Oral Daily  . furosemide  40 mg Oral QPM  . metoprolol succinate  100 mg Oral BID  . metroNIDAZOLE  500 mg Oral Q8H  . multivitamin with minerals  1 tablet Oral Daily  . potassium chloride  40 mEq Oral Q4H  . pravastatin  20 mg Oral QHS  . [START ON 01/02/2016] predniSONE  5 mg Oral Q breakfast  . sodium chloride flush  3 mL Intravenous Q12H  . verapamil  80 mg Oral Q12H   Continuous Infusions:    LOS: 5 days    Time spent: 35 minutes    Ishan Sanroman, MD PhD [facility] Pager [phone]  If 7PM-7AM, please contact night-coverage [email or web address] Password [email or web address] 01/01/2016, 9:57 AM

## 2016-01-01 NOTE — Progress Notes (Signed)
Patient Name: [name] Date of Encounter: 01/01/2016  Primary Cardiologist: Dr. Marinda Elk Problem List     Principal Problem:   Sepsis [facility]) Active Problems:   Atrial fibrillation (HCC)   Hypertension   Hyperlipidemia   Rheumatoid arthritis ([place])   Exudative pleural effusion   Long QT interval   Pericardial effusion   GERD (gastroesophageal reflux disease)   Diarrhea   Fall   Chronic systolic CHF (congestive heart failure) (HCC)   Malnutrition of moderate degree   Immunosuppressed status ([place])   75 y.o. female with medical history significant of hypertension, hyperlipidemia, GERD, rheumatoid arthritis, pulmonary hypertension, atrial fibrillation, who presents with fever and diarrhea.    Subjective   HR peaking into the 140's overnight. Denies any palpitations at this time. No chest discomfort or dyspnea.  Notes tachycardia when she gets up and around    Inpatient Medications    Scheduled Meds: . cholecalciferol  2,000 Units Oral Daily  . doxycycline  100 mg Oral Q12H  . enoxaparin (LOVENOX) injection  40 mg Subcutaneous Q24H  . folic acid  2 mg Oral Daily  . furosemide  40 mg Oral QPM  . hydrocortisone sod succinate (SOLU-CORTEF) inj  25 mg Intravenous Daily  . metoprolol succinate  100 mg Oral BID  . metroNIDAZOLE  500 mg Oral Q8H  . multivitamin with minerals  1 tablet Oral Daily  . pravastatin  20 mg Oral QHS  . sodium chloride flush  3 mL Intravenous Q12H  . verapamil  80 mg Oral Q12H   Continuous Infusions:  PRN Meds: acetaminophen, fluticasone, guaiFENesin, hydrOXYzine, iopamidol   Vital Signs    Vitals:   12/30/15 2025 12/31/15 0500 12/31/15 2058 01/01/16 0500  BP: 131/85 124/72 (!) 122/92   Pulse: 80 88 83   Resp: 18 16 16    Temp: 98.3 F (36.8 C) 98 F (36.7 C) 97.5 F (36.4 C) 98.2 F (36.8 C)  TempSrc: Oral Oral Oral Oral  SpO2: 98% 99% 94% 100%  Weight:      Height:        Intake/Output Summary (Last 24 hours)  at 01/01/16 0840 Last data filed at 12/31/15 1000  Gross per 24 hour  Intake              240 ml  Output                0 ml  Net              240 ml   Filed Weights   12/27/15 2007 12/28/15 1410  Weight: 140 lb (63.5 kg) 143 lb 11.8 oz (65.2 kg)    Physical Exam    GEN: Well nourished, well developed Caucasian female appearing in no acute distress.  HEENT: Grossly normal.  Neck: Supple, no JVD, carotid bruits, or masses. Cardiac: Irregularly irregular tachy, no murmurs, rubs, or gallops. No clubbing, cyanosis, edema.  Radials/DP/PT 2+ and equal bilaterally.  Respiratory:  Respirations regular and unlabored, clear to auscultation bilaterally. GI: Soft, nontender, nondistended, BS + x 4. MS: no deformity or atrophy. Skin: warm and dry, no rash. Neuro:  Strength and sensation are intact. Psych: AAOx3.  Normal affect.  Labs    CBC  Recent Labs  12/30/15 0326 12/31/15 0421 01/01/16 0502  WBC 7.2 5.9 6.9  NEUTROABS 5.1  --  3.9  HGB 10.8* 10.9* 11.8*  HCT 33.8* 33.7* 36.7  MCV 93.9 92.3 92.0  PLT 191 217 241  Basic Metabolic Panel  Recent Labs  12/31/15 0421 01/01/16 0502  NA 140 144  K 3.7 3.3*  CL 110 107  CO2 24 29  GLUCOSE 107* 88  BUN 11 10  CREATININE 0.67 0.82  CALCIUM 8.5* 8.7*  MG 1.9 1.8   Liver Function Tests  Recent Labs  12/31/15 0421  AST 17  ALT 16  ALKPHOS 55  BILITOT 0.6  PROT 5.7*  ALBUMIN 2.9*    Telemetry    Atrial fibrillation,140's overnight.  - Personally Reviewed  ECG   No new tracings.   Radiology       Cardiac Studies   Echocardiogram: 12/30/2015 Study Conclusions  - Left ventricle: The cavity size was normal. Wall thickness was   increased in a pattern of mild LVH. Systolic function was normal.   The estimated ejection fraction was in the range of 50% to 55%.   Wall motion was normal; there were no regional wall motion   abnormalities. - Mitral valve: There was mild regurgitation. - Left atrium: The  atrium was moderately dilated. - Right ventricle: The cavity size was mildly dilated. Wall   thickness was normal. - Right atrium: The atrium was mildly dilated. - Atrial septum: No defect or patent foramen ovale was identified. - Pulmonary arteries: PA peak pressure: 34 mm Hg (S). - Pericardium, extracardiac: Small posterior pericardial effuson.  Patient Profile     76 y.o. female with past medical history of chronic atrial fibrillation (on Coumadin), HTN, HLD, RA and recent pericardial effusion (s/p pericardiocentesis and drain placement on 12/16/2015) admitted to [facility] on 12/27/2015 for evaluation of worsening fatigue and diarrhea. Diagnosed with SIRS. Cardiology consulted for atrial fibrillation with RVR.   Assessment & Plan    1. Chronic Atrial Fibrillation - has known history of chronic atrial fibrillation. HR has remained elevated overnight, in the 140's.Increase her metoprolol from 75mg  BID to 100mg  BID.  Increased Verapamil to 80mg  BID.  - This patients CHA2DS2-VASc Score and unadjusted Ischemic Stroke Rate (% per year) is equal to 3.2 % stroke rate/year from a score of 3 (HTN, Age, Female). Coumadin currently held in the setting of her recent pericardiocentesis with bloody drainage with plans to restart this 2-4 weeks out from her procedure.   HR is still fast .   Continue Metoprolol Verapamil 80 TID  has been added to her regimine.   Will see if this helps slow her HR  2. Recent Pericardial Effusion - recently admitted from 11/29 - 12/19/2015 for a large pericardial effusion having undergone a pericardiocentesis with drain placement on 12/16/2015 with removal of 546mL of dark sanguinous fluid. Fluid count showed 7800 WBC with no culture growth. Recommended to hold Coumadin for at least 2-4 weeks with plans to recheck a limited echo at that time prior to resuming Coumadin.  - unable to tolerate Colchicine secondary to diarrhea and rash. Currently on Enbrel, Methotrexate, and low-dose  Prednisone for her RA.  - repeat echo this admission shows "small posterior pericardial effuson. Should be of no clinical consequence. - Continue to hold coumadin   3. HTN - BP at 80/58 - 124/72 in the past 24 hours.  - continue with current medication regimen as outlined above.   4. HLD - continue statin therapy.   5. SIRS - presented with weakness and AMS. Was febrile, tachycardiac, tachypnea, hypotensive, and had an elevated lactic acid and WBC count. CXR with no acute findings and UA negative. Blood cultures show no growth to date.  -  currently on broad spectrum antibiotics.  - WBC improved - per admitting team  Signed, Mertie Moores, MD  01/01/2016, 8:40 AM    Mertie Moores, MD

## 2016-01-02 LAB — BASIC METABOLIC PANEL
ANION GAP: 6 (ref 5–15)
BUN: 11 mg/dL (ref 6–20)
CO2: 31 mmol/L (ref 22–32)
Calcium: 9.2 mg/dL (ref 8.9–10.3)
Chloride: 104 mmol/L (ref 101–111)
Creatinine, Ser: 0.76 mg/dL (ref 0.44–1.00)
GFR calc Af Amer: >60 mL/min (ref >60)
GLUCOSE: 89 mg/dL (ref 65–99)
POTASSIUM: 3.7 mmol/L (ref 3.5–5.1)
SODIUM: 141 mmol/L (ref 135–145)

## 2016-01-02 LAB — MAGNESIUM: MAGNESIUM: 1.9 mg/dL (ref 1.7–2.4)

## 2016-01-02 LAB — GLUCOSE, CAPILLARY: GLUCOSE-CAPILLARY: 153 mg/dL — AB (ref 65–99)

## 2016-01-02 LAB — PROCALCITONIN

## 2016-01-02 MED ORDER — VERAPAMIL HCL 120 MG PO TABS: 120.0000 mg | ORAL_TABLET | Freq: Two times a day (BID) | ORAL | 0 refills | Status: DC

## 2016-01-02 MED ORDER — PREDNISONE 5 MG PO TABS: 2.5000 mg | ORAL_TABLET | Freq: Every day | ORAL | Status: DC

## 2016-01-02 MED ORDER — VERAPAMIL HCL 120 MG PO TABS
120.0000 mg | ORAL_TABLET | Freq: Two times a day (BID) | ORAL | Status: DC
  Administered 2016-01-02: 120 mg via ORAL
  Filled 2016-01-02: qty 1

## 2016-01-02 MED ORDER — POTASSIUM CHLORIDE CRYS ER 20 MEQ PO TBCR
40.0000 meq | EXTENDED_RELEASE_TABLET | Freq: Once | ORAL | Status: AC
  Administered 2016-01-02: 40 meq via ORAL
  Filled 2016-01-02: qty 2

## 2016-01-02 MED ORDER — METRONIDAZOLE 500 MG PO TABS: 500.0000 mg | ORAL_TABLET | Freq: Three times a day (TID) | ORAL | 0 refills | Status: DC

## 2016-01-02 MED ORDER — METOPROLOL SUCCINATE ER 100 MG PO TB24: 100.0000 mg | ORAL_TABLET | Freq: Two times a day (BID) | ORAL | 0 refills | Status: DC

## 2016-01-02 NOTE — Progress Notes (Signed)
Patient Name: [name] Date of Encounter: 01/02/2016  Primary Cardiologist: Dr. Marinda Elk Problem List     Principal Problem:   Sepsis [facility]) Active Problems:   Atrial fibrillation (HCC)   Hypertension   Hyperlipidemia   Rheumatoid arthritis ([place])   Exudative pleural effusion   Long QT interval   Pericardial effusion   GERD (gastroesophageal reflux disease)   Diarrhea   Fall   Chronic systolic CHF (congestive heart failure) (HCC)   Malnutrition of moderate degree   Immunosuppressed status ([place])   75 y.o. female with medical history significant of hypertension, hyperlipidemia, GERD, rheumatoid arthritis, pulmonary hypertension, atrial fibrillation, who presents with fever and diarrhea.    Subjective   HR is generally improving . HR between 100-115 this am    Inpatient Medications    Scheduled Meds: . cholecalciferol  2,000 Units Oral Daily  . doxycycline  100 mg Oral Q12H  . enoxaparin (LOVENOX) injection  40 mg Subcutaneous Q24H  . folic acid  2 mg Oral Daily  . furosemide  40 mg Oral QPM  . metoprolol succinate  100 mg Oral BID  . metroNIDAZOLE  500 mg Oral Q8H  . multivitamin with minerals  1 tablet Oral Daily  . pravastatin  20 mg Oral QHS  . predniSONE  5 mg Oral Q breakfast  . sodium chloride flush  3 mL Intravenous Q12H  . verapamil  80 mg Oral Q12H   Continuous Infusions:  PRN Meds: acetaminophen, fluticasone, guaiFENesin, hydrOXYzine, iopamidol   Vital Signs    Vitals:   01/01/16 0912 01/01/16 1416 01/01/16 2147 01/02/16 0614  BP: 114/77 114/62 114/80 118/86  Pulse: (!) 133 98 61   Resp:  18 16 16   Temp:  97.5 F (36.4 C) 97.8 F (36.6 C) 97.9 F (36.6 C)  TempSrc:  Oral Oral Oral  SpO2:  100% 100% 99%  Weight:      Height:        Intake/Output Summary (Last 24 hours) at 01/02/16 0830 Last data filed at 01/01/16 1700  Gross per 24 hour  Intake              480 ml  Output                0 ml  Net              480  ml   Filed Weights   12/27/15 2007 12/28/15 1410  Weight: 63.5 kg (140 lb) 65.2 kg (143 lb 11.8 oz)    Physical Exam    GEN: Well nourished, well developed Caucasian female appearing in no acute distress.  HEENT: Grossly normal.  Neck: Supple, no JVD, carotid bruits, or masses. Cardiac: Irregularly irregular tachy, no murmurs, rubs, or gallops. No clubbing, cyanosis, edema.  Radials/DP/PT 2+ and equal bilaterally.  Respiratory:  Respirations regular and unlabored, clear to auscultation bilaterally. GI: Soft, nontender, nondistended, BS + x 4. MS: no deformity or atrophy. Skin: warm and dry, no rash. Neuro:  Strength and sensation are intact. Psych: AAOx3.  Normal affect.  Labs    CBC  Recent Labs  12/31/15 0421 01/01/16 0502  WBC 5.9 6.9  NEUTROABS  --  3.9  HGB 10.9* 11.8*  HCT 33.7* 36.7  MCV 92.3 92.0  PLT 217 [id number]   Basic Metabolic Panel  Recent Labs  01/01/16 0502 01/02/16 0508  NA 144 141  K 3.3* 3.7  CL 107 104  CO2 29 31  GLUCOSE 88  89  BUN 10 11  CREATININE 0.82 0.76  CALCIUM 8.7* 9.2  MG 1.8 1.9   Liver Function Tests  Recent Labs  12/31/15 0421  AST 17  ALT 16  ALKPHOS 55  BILITOT 0.6  PROT 5.7*  ALBUMIN 2.9*    Telemetry    Atrial fibrillation,110s overnight.  - Personally Reviewed  ECG   No new tracings.   Radiology       Cardiac Studies   Echocardiogram: 12/30/2015 Study Conclusions  - Left ventricle: The cavity size was normal. Wall thickness was   increased in a pattern of mild LVH. Systolic function was normal.   The estimated ejection fraction was in the range of 50% to 55%.   Wall motion was normal; there were no regional wall motion   abnormalities. - Mitral valve: There was mild regurgitation. - Left atrium: The atrium was moderately dilated. - Right ventricle: The cavity size was mildly dilated. Wall   thickness was normal. - Right atrium: The atrium was mildly dilated. - Atrial septum: No defect or  patent foramen ovale was identified. - Pulmonary arteries: PA peak pressure: 34 mm Hg (S). - Pericardium, extracardiac: Small posterior pericardial effuson.  Patient Profile     75 y.o. female with past medical history of chronic atrial fibrillation (on Coumadin), HTN, HLD, RA and recent pericardial effusion (s/p pericardiocentesis and drain placement on 12/16/2015) admitted to [facility] on 12/27/2015 for evaluation of worsening fatigue and diarrhea. Diagnosed with SIRS. Cardiology consulted for atrial fibrillation with RVR.   Assessment & Plan    1. Chronic Atrial Fibrillation - has known history of chronic atrial fibrillation.   - This patients CHA2DS2-VASc Score and unadjusted Ischemic Stroke Rate (% per year) is equal to 3.2 % stroke rate/year from a score of 3 (HTN, Age, Female). Coumadin currently held in the setting of her recent pericardiocentesis with bloody drainage with plans to restart this 2-4 weeks out from her procedure.   HR is still fast .   Continue Metoprolol 100 mg BID  Increased Verapamil to 120 BID ( from 80 BID)    2. Recent Pericardial Effusion - recently admitted from 11/29 - 12/19/2015 for a large pericardial effusion having undergone a pericardiocentesis with drain placement on 12/16/2015 with removal of 526mL of dark sanguinous fluid. Fluid count showed 7800 WBC with no culture growth. Recommended to hold Coumadin for at least 2-4 weeks with plans to recheck a limited echo at that time prior to resuming Coumadin.  - unable to tolerate Colchicine secondary to diarrhea and rash. Currently on Enbrel, Methotrexate, and low-dose Prednisone for her RA.  - repeat echo this admission shows "small posterior pericardial effuson. Should be of no clinical consequence. - Continue to hold coumadin   3. HTN - BP at 80/58 - 124/72 in the past 24 hours.  - continue with current medication regimen as outlined above.   4. HLD - continue statin therapy.   5. SIRS - presented with  weakness and AMS. Was febrile, tachycardiac, tachypnea, hypotensive, and had an elevated lactic acid and WBC count. CXR with no acute findings and UA negative. Blood cultures show no growth to date.  - currently on broad spectrum antibiotics.  - WBC improved - per admitting team     Mertie Moores, MD  01/02/2016 8:35 AM    [address],  [place], [place]  [address] Pager [phone] Phone: [phone]; Fax: [phone]

## 2016-01-02 NOTE — Discharge Summary (Signed)
Discharge Summary  [name] [id number] DOB: 31-Aug-1940  PCP: Leeroy Cha, MD  Admit date: 12/27/2015 Discharge date: 01/02/2016  Time spent: >31mins  Recommendations for Outpatient Follow-up:  1. F/u with PMD within a week  for hospital discharge follow up, repeat cbc/bmp at follow up 2. F/u with cardiology on 12/19 for afib/pericardioeffusion. Discuss resuming coumadin at cardiology follow up.  Discharge Diagnoses:  Active Hospital Problems   Diagnosis Date Noted  . Sepsis ([place]) 12/27/2015  . Malnutrition of moderate degree 12/30/2015  . Immunosuppressed status ([place])   . Diarrhea 12/28/2015  . Fall 12/28/2015  . Chronic systolic CHF (congestive heart failure) ([place]) 12/28/2015  . GERD (gastroesophageal reflux disease) 12/15/2015  . Pericardial effusion 12/15/2015  . Long QT interval   . Exudative pleural effusion   . Rheumatoid arthritis ([place]) 04/24/2015  . Hyperlipidemia 05/15/2013  . Hypertension 05/15/2013  . Atrial fibrillation ([place]) 11/07/2012    Resolved Hospital Problems   Diagnosis Date Noted Date Resolved  No resolved problems to display.    Discharge Condition: stable  Diet recommendation: heart healthy  Filed Weights   12/27/15 2007 12/28/15 1410  Weight: 63.5 kg (140 lb) 65.2 kg (143 lb 11.8 oz)    History of present illness:  PCP: Leeroy Cha, MD   Patient coming from:  The patient is coming from home.  At baseline, pt is partiially dependent for most of ADL.   Chief Complaint: fever, diarrhea  HPI: [name] is a 75 y.o. female with medical history significant of hypertension, hyperlipidemia, GERD, rheumatoid arthritis, pulmonary hypertension, atrial fibrillation, who presents with fever and diarrhea.  Patient was recently hospitalized from 11/29-12/3 due to pericardial effusion. Pt underwent pericardiocentesis and pericardial drain placement with removal of 500 mL of dark sanguinous fluid. Fluid was  with inflammatory characteristics. She underwent repeated echocardiograms without significant fluid accumulation before discharge.   Pt states that she developed fever and diarrhea in the past 2 days. She had 3 bowel movements with watery stool yesterday. Has nausea and  vomiting x 2. No abdominal pain. She has mild cough, but no chest pain or shortness of breath. Patient states that she has generalized weakness, and had a fall on Friday, injured her frontal area. Denies unilateral weakness, numbness or tingling sensations extremities. No vision change or hearing loss. No symptoms of UTI. She states that she has itchy rashes in frontal chest and abdominal wall.  ED Course: pt was found to have hypotension with blood pressure down to 85/67, which responded to IV fluid, improved to 92/57, lactate 2.07, INR 1.23, WBC 20.6, negative troponin, electrolytes renal function okay, negative CT head, negative chest x-ray, temperature 1 was 3.5, tachycardia, tachypnea, oxygen saturation 93-95 percent on room air. Patient is admitted to stepdown as inpatient.   Hospital Course:  Principal Problem:   Sepsis ([place]) Active Problems:   Atrial fibrillation ([facility])   Hypertension   Hyperlipidemia   Rheumatoid arthritis ([place])   Exudative pleural effusion   Long QT interval   Pericardial effusion   GERD (gastroesophageal reflux disease)   Diarrhea   Fall   Chronic systolic CHF (congestive heart failure) ([facility])   Malnutrition of moderate degree   Immunosuppressed status ([place])   Sepsis presented on admission in an heavily immunosuppressed individual: with questionable cdiff (+gi symptom) Patient is presented with fever of 103, tachycardia, leukocytosis, lactic acidosis, brief hypotension, sbp in 80's initially, with abdominal pain, n/v/d CXR does not show pneumonia. UA negative. Blood culture no growth, urine  culture no growth, Stool work up shows c diff antigen positive. Toxin negative. Gi pcr panel  negative. She is admitted to stepdown and started on vanc/cefepime/flagyl/ivf /stress dose steroid on admission, home meds methotraxate, enbrel held  I donot see procalcitonin being ordered or being processed on admission, procalcitonin on 12/13 0.13 after two days of abx and patient with clinical improvement, repeat procalcitonin on 12/15 <0.10, vanc and cefepime d/ced after clinical improvement. She is changed to doxycycline, she finished total of 7 days of abx treatment in the hospital.  fever has resolved, tachycardia and bp improved. Lactic acid normalized.  Patient has persistent abdominal pain and diarrhea initially, CT ab /pel with gallstone, but no cholecystitis, no colitis.  Ab pain and diarrhea has improved on 12/15 and symptom has resolved at discharge.  will continue flagyl to finish total of 14day treatment for presumed c diff diarrhea. PPI stopped as well.  Pericardial effusion:  Colchicine was ordered on admission Patient refuse take colchicine due to c/o rash on her chest after started on colchicine, she also thinks colchicine gives her diarrhea She does report more sob, but denies chest pain, cardiology consulted, oked with d/c cholchicine. Repeat echo with minimal pericardial effusion.   Atrial fibrillation/RVR:   Anticoagulation on hold after recent pericardiocentesis. Patient has persistent tachycardia, cardiology consulted,  metoprolol and verapamil dose adjusted several times, at discharge heart rate is better controlled, she is discharged on toptolxl 100mg  bid, verapamil 120 BID  cardiology input appreciated. She is to follow up with cardiology on 12/19    QTc prolongation  QTc 570 on admission, Qtc has normalized on 12/15   Rheumatoid arthritis.  On methoraxate, enbrel and daily prednisone methotraxate and embrel held,  She is on stress dose steroids since admitted, tapered  to baseline   Hypertension:  With brief hypotension on admission , she was put  on stress dose steroids bp now stable on BB and CCB, taper stress dose steroids to baseline dose. Lasix held since admission, resumed on 12/14 bp stable at discharge  Hypokalemia: replace k  GERD:  Stable.   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   Non-severe (moderate) malnutrition in context of acute illness/injury: nutrition input appreciated.  Herpes labialis: treated with valtrex on 12/15  DVT prophylaxis: (Lovenox) Code Status: full code.  Family Communication:none at bedside.  Disposition Plan: home  On 12/17 with cardiology clearance   Consultants:   cardiology   Procedures:  None   Antimicrobials:   vancomycin , cefepime from admission to 12/14  Doxycycline from 12/14 to 12/17  Flagyl from admission    Discharge Exam: BP 118/86 (BP Location: Right Arm)   Pulse 61   Temp 97.9 F (36.6 C) (Oral)   Resp 16   Ht 5\' 3"  (1.6 m)   Wt 65.2 kg (143 lb 11.8 oz)   SpO2 99%   BMI 25.46 kg/m    General exam: Appears calm and comfortable , herpes labialis upper lip is healing Respiratory system: Clear to auscultation. Respiratory effort normal. Cardiovascular system: IRRR. No pedal edema. Gastrointestinal system: Abdomen is nondistended, soft and nontender. No organomegaly or masses felt. Normal bowel sounds heard. Central nervous system: Alert and oriented. No focal neurological deficits. Extremities: Symmetric 5 x 5 power. Skin: No rashes, lesions or ulcers   Discharge Instructions You were cared for by a hospitalist during your hospital  stay. If you have any questions about your discharge medications or the care you received while you were in the hospital after you are discharged, you can call the unit and asked to speak with the hospitalist on call if the hospitalist that took care of you is not available. Once  you are discharged, your primary care physician will handle any further medical issues. Please note that NO REFILLS for any discharge medications will be authorized once you are discharged, as it is imperative that you return to your primary care physician (or establish a relationship with a primary care physician if you do not have one) for your aftercare needs so that they can reassess your need for medications and monitor your lab values.  Discharge Instructions    Diet - low sodium heart healthy    Complete by:  As directed    Increase activity slowly    Complete by:  As directed      Allergies as of 01/02/2016      Reactions   Arthrotec [diclofenac-misoprostol] Diarrhea   Erythromycin Anaphylaxis      Morphine Sulfate Other (See Comments)   Reaction:  Hallucinations    Amoxicillin Rash, Other (See Comments)   Has patient had a PCN reaction causing immediate rash, facial/tongue/throat swelling, SOB or lightheadedness with hypotension: Yes Has patient had a PCN reaction causing severe rash involving mucus membranes or skin necrosis: Yes Has patient had a PCN reaction that required hospitalization No Has patient had a PCN reaction occurring within the last 10 years: No If all of the above answers are "NO", then may proceed with Cephalosporin use.   Diltiazem Hcl Swelling, Rash, Other (See Comments)   Pt is able to tolerate Verapamil.     Gold-containing Drug Products Rash   Macrodantin [nitrofurantoin Macrocrystal] Rash   Naproxen Rash   Penicillins Rash, Other (See Comments)   Has patient had a PCN reaction causing immediate rash, facial/tongue/throat swelling, SOB or lightheadedness with hypotension: Yes Has patient had a PCN reaction causing severe rash involving mucus membranes or skin necrosis: Yes Has patient had a PCN reaction that required hospitalization No Has patient had a PCN reaction occurring within the last 10 years: No If all of the above answers are "NO", then may  proceed with Cephalosporin use.      Medication List    STOP taking these medications   colchicine 0.6 MG tablet   omeprazole 20 MG capsule Commonly known as:  PRILOSEC     TAKE these medications   acidophilus Caps capsule Take 1 capsule by mouth daily.   cholecalciferol 1000 units tablet Commonly known as:  VITAMIN D Take 2,000 Units by mouth daily.   etanercept 25 MG injection Commonly known as:  ENBREL Inject 25 mg into the skin every Friday.   fluticasone 50 MCG/ACT nasal spray Commonly known as:  FLONASE Place 2 sprays into both nostrils daily as needed for rhinitis.   folic acid 1 MG tablet Commonly known as:  FOLVITE Take 2 mg by mouth daily.   furosemide 40 MG tablet Commonly known as:  LASIX Take 40 mg by mouth every evening.   guaiFENesin 600 MG 12 hr tablet Commonly known as:  MUCINEX Take 600 mg by mouth 2 (two) times daily as needed for cough or to loosen phlegm.   methotrexate 2.5 MG tablet Commonly known as:  RHEUMATREX Take 2.5-7.5 mg by mouth 2 (two) times a week. Pt takes three tablets every Tuesday and two tablets every  Wednesday.   metoprolol succinate 100 MG 24 hr tablet Commonly known as:  TOPROL-XL Take 1 tablet (100 mg total) by mouth 2 (two) times daily. Take with or immediately following a meal. What changed:  medication strength  how much to take   metroNIDAZOLE 500 MG tablet Commonly known as:  FLAGYL Take 1 tablet (500 mg total) by mouth 3 (three) times daily.   multivitamin with minerals tablet Take 1 tablet by mouth daily.   potassium chloride 10 MEQ tablet Commonly known as:  K-DUR,KLOR-CON Take 10 mEq by mouth daily.   pravastatin 20 MG tablet Commonly known as:  PRAVACHOL Take 20 mg by mouth at bedtime.   predniSONE 2.5 MG tablet Commonly known as:  DELTASONE Take 2.5 mg by mouth daily with breakfast.   ranitidine 150 MG tablet Commonly known as:  ZANTAC Take 1 tablet (150 mg total) by mouth at bedtime.    verapamil 120 MG tablet Commonly known as:  CALAN Take 1 tablet (120 mg total) by mouth every 12 (twelve) hours. What changed:  medication strength  how much to take      Allergies  Allergen Reactions  . Arthrotec [Diclofenac-Misoprostol] Diarrhea  . Erythromycin Anaphylaxis       . Morphine Sulfate Other (See Comments)    Reaction:  Hallucinations   . Amoxicillin Rash and Other (See Comments)    Has patient had a PCN reaction causing immediate rash, facial/tongue/throat swelling, SOB or lightheadedness with hypotension: Yes Has patient had a PCN reaction causing severe rash involving mucus membranes or skin necrosis: Yes Has patient had a PCN reaction that required hospitalization No Has patient had a PCN reaction occurring within the last 10 years: No If all of the above answers are "NO", then may proceed with Cephalosporin use.  . Diltiazem Hcl Swelling, Rash and Other (See Comments)    Pt is able to tolerate Verapamil.    . Gold-Containing Drug Products Rash  . Macrodantin [Nitrofurantoin Macrocrystal] Rash  . Naproxen Rash  . Penicillins Rash and Other (See Comments)    Has patient had a PCN reaction causing immediate rash, facial/tongue/throat swelling, SOB or lightheadedness with hypotension: Yes Has patient had a PCN reaction causing severe rash involving mucus membranes or skin necrosis: Yes Has patient had a PCN reaction that required hospitalization No Has patient had a PCN reaction occurring within the last 10 years: No If all of the above answers are "NO", then may proceed with Cephalosporin use.    Follow-up Information    Leeroy Cha, MD Follow up in 1 week(s).   Specialty:  Internal Medicine Why:  hospital discharge follow up, repeat cbc/bmp at follow up. Contact information: [address] [place] [address] [place] [address] [phone]        Larae Grooms, MD Follow up on 01/04/2016.   Specialties:  Cardiology, Radiology,  Interventional Cardiology Contact information: [id number] [place] [address] [phone]            The results of significant diagnostics from this hospitalization (including imaging, microbiology, ancillary and laboratory) are listed below for reference.    Significant Diagnostic Studies: Dg Chest 2 View  Result Date: 12/27/2015 CLINICAL DATA:  Fever shin EXAM: CHEST  2 VIEW COMPARISON:  12/16/2015 FINDINGS: Cardiac shadow remains enlarged. The lungs are well aerated bilaterally. No focal infiltrate or sizable effusion is seen. Previously seen pericardial catheter has been removed. Degenerative change of the thoracic spine is noted. IMPRESSION: No active cardiopulmonary  disease. Electronically Signed   By: Inez Catalina M.D.   On: 12/27/2015 21:23   Dg Chest 2 View  Result Date: 12/13/2015 CLINICAL DATA:  Follow-up of pneumonia. The patient has undergone antibiotic treatment. The patient reports persistent chest pain and shortness of breath. History of atrial fibrillation. EXAM: CHEST  2 VIEW COMPARISON:  PA and lateral chest x-ray dated September 15, 2015 FINDINGS: The cardiopericardial silhouette remains quite enlarged and has increased in conspicuity since the previous study. The lungs are well-expanded. There is no focal infiltrate. The pulmonary vascularity is not engorged. There is calcification in the wall of the aortic arch. There is mild multilevel degenerative disc disease of the thoracic spine. IMPRESSION: No evidence of pneumonia. Marked enlargement of the cardiac silhouette which is more conspicuous than on the previous study. This may reflect the presence of a pericardial effusion. No pulmonary edema. Thoracic aortic atherosclerosis. Electronically Signed   By: David  Martinique M.D.   On: 12/13/2015 16:04   Ct Head Wo Contrast  Result Date: 12/28/2015 CLINICAL DATA:  75 year old female with fall.  Generalized weakness. EXAM: CT HEAD WITHOUT CONTRAST  TECHNIQUE: Contiguous axial images were obtained from the base of the skull through the vertex without intravenous contrast. COMPARISON:  Head CT dated 05/23/2015 FINDINGS: Brain: The ventricles and sulci are appropriate in size for patient's age. Mild periventricular and deep white matter chronic microvascular ischemic changes noted. There is no acute intracranial hemorrhage. No mass effect or midline shift noted. Vascular: No hyperdense vessel or unexpected calcification. Skull: Normal. Negative for fracture or focal lesion. Sinuses/Orbits: No acute finding. Other: None IMPRESSION: No acute intracranial pathology. Mild age-related atrophy and chronic microvascular ischemic changes. Electronically Signed   By: Anner Crete M.D.   On: 12/28/2015 02:04   Ct Chest W Contrast  Result Date: 12/15/2015 CLINICAL DATA:  Increased cough, shortness of breath, weakness and decreased appetite over the past few weeks. Loculated pleural effusion. EXAM: CT CHEST WITH CONTRAST TECHNIQUE: Multidetector CT imaging of the chest was performed during intravenous contrast administration. CONTRAST:  71mL ISOVUE-300 IOPAMIDOL (ISOVUE-300) INJECTION 61% COMPARISON:  05/24/2015. FINDINGS: Cardiovascular: Atherosclerotic calcification of the arterial vasculature. Heart is mildly enlarged. Large pericardial effusion is new from 05/24/2015. Mediastinum/Nodes: Mediastinal lymph nodes are not enlarged by CT size criteria. No hilar or axillary adenopathy. Esophagus is grossly unremarkable. Tiny hiatal hernia. Lungs/Pleura: Mild volume loss in the medial aspects of the right middle lobe and lingula. Minimal dependent atelectasis bilaterally. Lungs are otherwise clear. No pleural fluid. Airway is unremarkable. Upper Abdomen: Visualized portions of the liver, adrenal glands, kidneys, spleen, pancreas, stomach and bowel are grossly unremarkable with exception of a tiny hiatal hernia. Upper abdominal lymph nodes are not enlarged by CT size  criteria. Musculoskeletal: No worrisome lytic or sclerotic lesions. Degenerative changes are seen in the spine. IMPRESSION: 1. Large pericardial effusion, new from 05/24/2015. These results will be called to the ordering clinician or representative by the Radiologist Assistant, and communication documented in the PACS or zVision Dashboard. 2.  Aortic atherosclerosis (ICD10-170.0). Electronically Signed   By: Lorin Picket M.D.   On: 12/15/2015 16:08   Ct Abdomen Pelvis W Contrast  Result Date: 12/29/2015 CLINICAL DATA:  Chronic AFib with worsening fatigue in diarrhea EXAM: CT ABDOMEN AND PELVIS WITH CONTRAST TECHNIQUE: Multidetector CT imaging of the abdomen and pelvis was performed using the standard protocol following bolus administration of intravenous contrast. CONTRAST:  156mL ISOVUE-300 IOPAMIDOL (ISOVUE-300) INJECTION 61% COMPARISON:  05/25/2015 ultrasound, CT 04/08/2010 FINDINGS: Lower  chest: Small bilateral right greater than left pleural effusion. Passive atelectasis in the bilateral lower lobes. 2.5 cm cyst or bleb in the right lower lobe. Small bleb posterior left lower lobe. There is cardiomegaly with biatrial enlargement. Small pericardial effusion. Small nodular focus right middle lobe, possibly inflammatory or related to atelectasis. Hepatobiliary: Mild diffuse decreased density of the hepatic parenchyma consistent with fatty change. No focal hepatic abnormality. The gallbladder is contracted and filled with hyperdense sludge or calcified stones. No biliary dilatation. Pancreas: Unremarkable. No pancreatic ductal dilatation or surrounding inflammatory changes. Spleen: Normal in size without focal abnormality. Adrenals/Urinary Tract: Adrenal glands are within normal limits. Subcentimeter cortical hypodense lesions in the kidneys, too small to further characterize. No hydronephrosis. Bladder is unremarkable Stomach/Bowel: Stomach is nonenlarged. No dilated small bowel to suggest an obstruction.  There is no colon wall thickening. The appendix is not well identified but no right lower quadrant inflammation is seen. Vascular/Lymphatic: Aortic atherosclerosis. No enlarged abdominal or pelvic lymph nodes. Reproductive: Status post hysterectomy. No adnexal masses. Other: Trace amount of fluid in the pelvis.  No free air. Musculoskeletal: Degenerative changes of the spine. Probable hemangioma in T11. IMPRESSION: 1. No evidence for bowel obstruction. No significant colon wall thickening or colon inflammation. 2. Small bilateral pleural effusions, right greater than left. 3. Cardiomegaly with biatrial enlargement. Small pericardial effusion. 4. Mild fatty infiltration of the liver 5. Contracted gallbladder filled with calcified stones Electronically Signed   By: Donavan Foil M.D.   On: 12/29/2015 21:02   Dg Chest Port 1 View  Result Date: 12/16/2015 CLINICAL DATA:  Pericardiocentesis for pericardial effusion. EXAM: PORTABLE CHEST 1 VIEW COMPARISON:  CT from 12/15/2015 FINDINGS: There is a catheter noted along the base of the heart with the tip in head the right cardiophrenic angle and base of heart. Mild interstitial prominence is noted without pneumothorax. The cardiac silhouette is enlarged. There is mild aortic atherosclerosis. No pneumonic consolidation or suspicious osseous lesions. IMPRESSION: Catheter tip is seen along the base the heart to the right of midline. Enlarged cardiac silhouette is noted. There is aortic atherosclerosis. No pneumothorax is identified. Electronically Signed   By: [name] Royalty M.D.   On: 12/16/2015 19:24    Microbiology: Recent Results (from the past 240 hour(s))  Culture, blood (Routine x 2)     Status: None   Collection Time: 12/27/15  8:26 PM  Result Value Ref Range Status   Specimen Description LEFT ANTECUBITAL BLOOD  Final   Special Requests BOTTLES DRAWN AEROBIC AND ANAEROBIC 5CC  Final   Culture   Final    NO GROWTH 5 DAYS Performed at [facility]     Report Status 01/01/2016 FINAL  Final  Culture, blood (Routine x 2)     Status: None   Collection Time: 12/27/15  8:53 PM  Result Value Ref Range Status   Specimen Description BLOOD RIGHT FOREARM  Final   Special Requests BOTTLES DRAWN AEROBIC AND ANAEROBIC 5CC  Final   Culture   Final    NO GROWTH 5 DAYS Performed at [facility]    Report Status 01/01/2016 FINAL  Final  Urine culture     Status: None   Collection Time: 12/27/15 11:23 PM  Result Value Ref Range Status   Specimen Description URINE, CATHETERIZED  Final   Special Requests NONE  Final   Culture NO GROWTH Performed at [facility]   Final   Report Status 12/29/2015 FINAL  Final  C difficile quick  scan w PCR reflex     Status: Abnormal   Collection Time: 12/28/15  8:07 AM  Result Value Ref Range Status   C Diff antigen POSITIVE (A) NEGATIVE Final   C Diff toxin NEGATIVE NEGATIVE Final   C Diff interpretation Results are indeterminate. See PCR results.  Final  Gastrointestinal Panel by PCR , Stool     Status: None   Collection Time: 12/28/15  8:07 AM  Result Value Ref Range Status   Campylobacter species NOT DETECTED NOT DETECTED Final   Plesimonas shigelloides NOT DETECTED NOT DETECTED Final   Salmonella species NOT DETECTED NOT DETECTED Final   Yersinia enterocolitica NOT DETECTED NOT DETECTED Final   Vibrio species NOT DETECTED NOT DETECTED Final   Vibrio cholerae NOT DETECTED NOT DETECTED Final   Enteroaggregative E coli (EAEC) NOT DETECTED NOT DETECTED Final   Enteropathogenic E coli (EPEC) NOT DETECTED NOT DETECTED Final   Enterotoxigenic E coli (ETEC) NOT DETECTED NOT DETECTED Final   Shiga like toxin producing E coli (STEC) NOT DETECTED NOT DETECTED Final   Shigella/Enteroinvasive E coli (EIEC) NOT DETECTED NOT DETECTED Final   Cryptosporidium NOT DETECTED NOT DETECTED Final   Cyclospora cayetanensis NOT DETECTED NOT DETECTED Final   Entamoeba histolytica NOT DETECTED NOT DETECTED Final    Giardia lamblia NOT DETECTED NOT DETECTED Final   Adenovirus F40/41 NOT DETECTED NOT DETECTED Final   Astrovirus NOT DETECTED NOT DETECTED Final   Norovirus GI/GII NOT DETECTED NOT DETECTED Final   Rotavirus A NOT DETECTED NOT DETECTED Final   Sapovirus (I, II, IV, and V) NOT DETECTED NOT DETECTED Final  Clostridium Difficile by PCR     Status: None   Collection Time: 12/28/15  8:07 AM  Result Value Ref Range Status   Toxigenic C Difficile by pcr NEGATIVE NEGATIVE Final    Comment: Patient is colonized with non toxigenic C. difficile. May not need treatment unless significant symptoms are present. Performed at [facility]   MRSA PCR Screening     Status: None   Collection Time: 12/30/15  1:10 PM  Result Value Ref Range Status   MRSA by PCR NEGATIVE NEGATIVE Final    Comment:        The GeneXpert MRSA Assay (FDA approved for NASAL specimens only), is one component of a comprehensive MRSA colonization surveillance program. It is not intended to diagnose MRSA infection nor to guide or monitor treatment for MRSA infections.      Labs: Basic Metabolic Panel:  Recent Labs Lab 12/29/15 0334 12/30/15 0326 12/31/15 0421 01/01/16 0502 01/02/16 0508  NA 142 140 140 144 141  K 3.4* 3.5 3.7 3.3* 3.7  CL 111 111 110 107 104  CO2 24 24 24 29 31   GLUCOSE 122* 103* 107* 88 89  BUN 9 10 11 10 11   CREATININE 0.71 0.67 0.67 0.82 0.76  CALCIUM 8.8* 8.8* 8.5* 8.7* 9.2  MG  --  2.0 1.9 1.8 1.9   Liver Function Tests:  Recent Labs Lab 12/27/15 2025 12/31/15 0421  AST 25 17  ALT 15 16  ALKPHOS 76 55  BILITOT 1.0 0.6  PROT 7.8 5.7*  ALBUMIN 4.0 2.9*   No results for input(s): LIPASE, AMYLASE in the last 168 hours. No results for input(s): AMMONIA in the last 168 hours. CBC:  Recent Labs Lab 12/27/15 2025 12/28/15 0428 12/29/15 0334 12/30/15 0326 12/31/15 0421 01/01/16 0502  WBC 20.6* 11.4* 12.3* 7.2 5.9 6.9  NEUTROABS 17.1*  --   --  5.1  --  3.9  HGB 14.9  11.8* 11.9* 10.8* 10.9* 11.8*  HCT 43.8 36.5 36.9 33.8* 33.7* 36.7  MCV 89.8 93.8 94.6 93.9 92.3 92.0  PLT 261 188 206 191 217 241   Cardiac Enzymes: No results for input(s): CKTOTAL, CKMB, CKMBINDEX, TROPONINI in the last 168 hours. BNP: BNP (last 3 results)  Recent Labs  12/15/15 2047 12/27/15 2025  BNP 224.8* 283.3*    ProBNP (last 3 results)  Recent Labs  12/13/15 1704  PROBNP 215.0*    CBG:  Recent Labs Lab 12/29/15 0746 12/30/15 0844 12/31/15 0812 01/01/16 0818 01/02/16 0802  GLUCAP 127* 102* 171* 108* 153*       Signed:  Jareli Highland MD, PhD  [facility] 01/02/2016, 12:40 PM

## 2016-01-03 NOTE — Progress Notes (Signed)
Cardiology Office Note   Date:  01/04/2016   ID:  [name], DOB Nov 01, 1940, MRN [id number]  PCP:  Leeroy Cha, MD    No chief complaint on file. AFib   Wt Readings from Last 3 Encounters:  01/04/16 144 lb 1.9 oz (65.4 kg)  12/28/15 143 lb 11.8 oz (65.2 kg)  12/16/15 148 lb 2.4 oz (67.2 kg)       History of Present Illness: [name] is a 75 y.o. female  with a hx of chronic AF, RA, HTN, HL.  She is on Coumadin for anticoagulation.   She was admitted in 4/17 with chest pain. Echo demonstrated normal LVEF with mod pulmonary HTN (PASP 48 mmHg). CEs remained neg. No further ischemic workup was pursued.  She was then readmitted 5/17 with HCAP with assoc moderate pleural effusion.  She underwent left-sided thoracentesis.  Effusion was felt to be exudative and concerning for infectious-empyema. Cytology was negative for malignancy.   She is feeling better but still has some DOE.  She still feels occasional palpitations, but they only last a few seconds and are manageable.  She was very pleased with the care she received at [facility]  She had a pericardial effusion in 11/17.  THis was drained and was bloody.    She was discharged but then readmitted with diarhea, possible C Diff.  She did not want to take colchicine for pericarditis.  Today, she feels better. Her energy level is better. She continues to be concerned about hair loss.    Past Medical History:  Diagnosis Date  . Acne rosacea   . Adhesive capsulitis of left shoulder 1980  . Allergic rhinitis   . Chronic atrial fibrillation ([place])    a. Coumadin anticoagulation - [facility] ([place])  . Chronic bronchitis ([place])   . Esophageal reflux   . Frequent epistaxis    "cause of my coumadin" (12/16/2015)  . Heart murmur   . History of echocardiogram    a. Echo 4/17:  EF 55-60%, no RWMA, mild MR, mild LAE, PASP 48 mmHg, trivial effusion post to heart  . Hypercholesterolemia   .  Hyperlipidemia   . Hypertension   . Osteopenia   . Pleural effusion associated with pulmonary infection    Admx 5/17 >> required L thoracentesis (cytology neg for malignancy)   . Pneumonia    "I've had it 4 times this year" (12/16/2015)  . Polymorphic light eruption 2004  . Prolonged QT interval   . Pulmonary HTN   . Respiratory failure ([place]) 04/2015   "spent 3 days; went home; then another 13 days in hospital" (12/16/2015  . Rheumatoid arthritis ([place]) 1975  . Right middle ear infection 1986  . Skin cancer    "burndt it off at the right side of my nose/face" (12/16/2015)    Past Surgical History:  Procedure Laterality Date  . BREAST BIOPSY Left 2000s X 2  . CARDIAC CATHETERIZATION  03/2010   a. Myoview 2/09: no ischemia; low risk  //  b. [place] 3/12: normal coronary arteries  . CARDIAC CATHETERIZATION N/A 12/16/2015   Procedure: Pericardiocentesis;  Surgeon: Nelva Bush, MD;  Location: [facility];  Service: Cardiovascular;  Laterality: N/A;  . CARDIOVERSION  04/2010  . TEMPOROMANDIBULAR JOINT SURGERY Bilateral   . TOTAL ABDOMINAL HYSTERECTOMY       Current Outpatient Prescriptions  Medication Sig Dispense Refill  . acidophilus (RISAQUAD) CAPS capsule Take 1 capsule by mouth daily.    Marland Kitchen  cholecalciferol (VITAMIN D) 1000 UNITS tablet Take 2,000 Units by mouth daily.    Marland Kitchen etanercept (ENBREL) 25 MG injection Inject 25 mg into the skin every Friday.     . fluticasone (FLONASE) 50 MCG/ACT nasal spray Place 2 sprays into both nostrils daily as needed for rhinitis.     . folic acid (FOLVITE) 1 MG tablet Take 2 mg by mouth daily.     . furosemide (LASIX) 40 MG tablet Take 40 mg by mouth every evening.    Marland Kitchen guaiFENesin (MUCINEX) 600 MG 12 hr tablet Take 600 mg by mouth 2 (two) times daily as needed for cough or to loosen phlegm.     . methotrexate (RHEUMATREX) 2.5 MG tablet Take 2.5-7.5 mg by mouth 2 (two) times a week. Pt takes three tablets every Tuesday and two tablets every  Wednesday.  1  . metoprolol succinate (TOPROL-XL) 100 MG 24 hr tablet Take 1 tablet (100 mg total) by mouth 2 (two) times daily. Take with or immediately following a meal. 60 tablet 0  . metroNIDAZOLE (FLAGYL) 500 MG tablet Take 1 tablet (500 mg total) by mouth 3 (three) times daily. 21 tablet 0  . Multiple Vitamins-Minerals (MULTIVITAMIN WITH MINERALS) tablet Take 1 tablet by mouth daily.    . potassium chloride (K-DUR,KLOR-CON) 10 MEQ tablet Take 10 mEq by mouth daily.    . pravastatin (PRAVACHOL) 20 MG tablet Take 20 mg by mouth at bedtime.    . predniSONE (DELTASONE) 2.5 MG tablet Take 2.5 mg by mouth daily with breakfast.    . ranitidine (ZANTAC) 150 MG tablet Take 1 tablet (150 mg total) by mouth at bedtime. 30 tablet 0  . verapamil (CALAN) 120 MG tablet Take 1 tablet (120 mg total) by mouth every 12 (twelve) hours. 60 tablet 0   No current facility-administered medications for this visit.     Allergies:   Arthrotec [diclofenac-misoprostol]; Erythromycin; Morphine sulfate; Amoxicillin; Diltiazem hcl; Gold-containing drug products; Macrodantin [nitrofurantoin macrocrystal]; Naproxen; and Penicillins    Social History:  The patient  reports that she has never smoked. She has never used smokeless tobacco. She reports that she does not drink alcohol or use drugs.   Family History:  The patient's family history includes Healthy in her sister; Heart disease in her father, mother, sister, and sister; Other in her mother.    ROS:  Please see the history of present illness.   Otherwise, review of systems are positive for Recent diarrhea.   All other systems are reviewed and negative.    PHYSICAL EXAM: VS:  BP 130/64   Pulse 75   Ht 5\' 3"  (1.6 m)   Wt 144 lb 1.9 oz (65.4 kg)   BMI 25.53 kg/m  , BMI Body mass index is 25.53 kg/m. GEN: Well nourished, well developed, in no acute distress  HEENT: normal  Neck: no JVD, carotid bruits, or masses Cardiac: irregularly irregular; no murmurs,  rubs, or gallops,no edema  Respiratory:  Scant basilar crackles bilaterally, normal work of breathing GI: soft, nontender, nondistended, + BS MS: no deformity or atrophy  Skin: warm and dry, no rash Neuro:  Strength and sensation are intact Psych: euthymic mood, full affect    Recent Labs: 04/24/2015: TSH 1.752 12/13/2015: Pro B Natriuretic peptide (BNP) 215.0 12/27/2015: B Natriuretic Peptide 283.3 12/31/2015: ALT 16 01/01/2016: Hemoglobin 11.8; Platelets 241 01/02/2016: BUN 11; Creatinine, Ser 0.76; Magnesium 1.9; Potassium 3.7; Sodium 141   Lipid Panel    Component Value Date/Time   CHOL 134  05/24/2015 1344   TRIG 148.0 05/21/2013 0846   HDL 36.60 (L) 05/21/2013 0846   CHOLHDL 4 05/21/2013 0846   VLDL 29.6 05/21/2013 0846   LDLCALC 79 05/21/2013 0846     Other studies Reviewed: Additional studies/ records that were reviewed today with results demonstrating: Hospital records reviewed.   ASSESSMENT AND PLAN:  1. AFib, rate controlled.  Currently off Coumadin or stroke prevention due to bloody pericardial effusion.  Continue current dose of Metoprolol Succinate and Verapamil.She cannot tolerate Diltiazem due to significant swelling. CHADS2-VASc=3 (female, age > 29, HTN). Restart Coumadin during the first week of January. She'll need a limited echocardiogram at the end of January 2 for pericardial effusion. INR check arranged with the pharmacy. 2. HTN: Blood pressure controlled. Continue current medications. 3. Dyspnea on exertion: I think is related to some deconditioning after recovering from her recent illness.  This should improve with more activity. 4. Husband in rehab after hip fracture.  He has advanced heart failure also.    Current medicines are reviewed at length with the patient today.  The patient concerns regarding her medicines were addressed.  The following changes have been made:  No change  Labs/ tests ordered today include:  No orders of the defined  types were placed in this encounter.   Recommend 150 minutes/week of aerobic exercise Low fat, low carb, high fiber diet recommended  Disposition:   FU in 9 months   Signed, Larae Grooms, MD  01/04/2016 3:04 PM    [facility] [address], [place], [place]  [address] Phone: [phone]; Fax: [phone]

## 2016-01-04 ENCOUNTER — Encounter (INDEPENDENT_AMBULATORY_CARE_PROVIDER_SITE_OTHER): Payer: Self-pay

## 2016-01-04 ENCOUNTER — Encounter: Payer: Self-pay | Admitting: Interventional Cardiology

## 2016-01-04 ENCOUNTER — Ambulatory Visit (INDEPENDENT_AMBULATORY_CARE_PROVIDER_SITE_OTHER): Payer: Medicare Other | Admitting: Interventional Cardiology

## 2016-01-04 ENCOUNTER — Ambulatory Visit: Payer: Medicare Other | Admitting: Interventional Cardiology

## 2016-01-04 VITALS — BP 130/64 | HR 75 | Ht 63.0 in | Wt 144.1 lb

## 2016-01-04 DIAGNOSIS — I3139 Other pericardial effusion (noninflammatory): Secondary | ICD-10-CM

## 2016-01-04 DIAGNOSIS — I10 Essential (primary) hypertension: Secondary | ICD-10-CM | POA: Diagnosis not present

## 2016-01-04 DIAGNOSIS — I4819 Other persistent atrial fibrillation: Secondary | ICD-10-CM

## 2016-01-04 DIAGNOSIS — R0609 Other forms of dyspnea: Secondary | ICD-10-CM

## 2016-01-04 DIAGNOSIS — I313 Pericardial effusion (noninflammatory): Secondary | ICD-10-CM

## 2016-01-04 DIAGNOSIS — I481 Persistent atrial fibrillation: Secondary | ICD-10-CM

## 2016-01-04 NOTE — Patient Instructions (Addendum)
Medication Instructions:  Resume Coumadin at 5 mg daily.  Labwork: None  Testing/Procedures: Your physician has requested that you have a limited echocardiogram. Echocardiography is a painless test that uses sound waves to create images of your heart. It provides your doctor with information about the size and shape of your heart and how well your heart's chambers and valves are working. This procedure takes approximately one hour. There are no restrictions for this procedure.    Follow-Up: In 3 months with Dr Irish Lack. With Coumadin Clinic the second week of January, 2018      If you need a refill on your cardiac medications before your next appointment, please call your pharmacy.

## 2016-01-08 ENCOUNTER — Emergency Department (HOSPITAL_COMMUNITY): Payer: Medicare Other

## 2016-01-08 ENCOUNTER — Inpatient Hospital Stay (HOSPITAL_COMMUNITY)
Admission: EM | Admit: 2016-01-08 | Discharge: 2016-01-16 | DRG: 871 | Disposition: A | Discharge status: Home Health | Payer: Medicare Other | Attending: Internal Medicine | Admitting: Internal Medicine

## 2016-01-08 ENCOUNTER — Encounter (HOSPITAL_COMMUNITY): Payer: Self-pay | Admitting: Emergency Medicine

## 2016-01-08 ENCOUNTER — Inpatient Hospital Stay (HOSPITAL_COMMUNITY): Payer: Medicare Other

## 2016-01-08 DIAGNOSIS — M8580 Other specified disorders of bone density and structure, unspecified site: Secondary | ICD-10-CM | POA: Diagnosis present

## 2016-01-08 DIAGNOSIS — Z888 Allergy status to other drugs, medicaments and biological substances status: Secondary | ICD-10-CM

## 2016-01-08 DIAGNOSIS — E871 Hypo-osmolality and hyponatremia: Secondary | ICD-10-CM | POA: Diagnosis present

## 2016-01-08 DIAGNOSIS — M069 Rheumatoid arthritis, unspecified: Secondary | ICD-10-CM | POA: Diagnosis present

## 2016-01-08 DIAGNOSIS — I482 Chronic atrial fibrillation, unspecified: Secondary | ICD-10-CM | POA: Diagnosis present

## 2016-01-08 DIAGNOSIS — Z85828 Personal history of other malignant neoplasm of skin: Secondary | ICD-10-CM

## 2016-01-08 DIAGNOSIS — K219 Gastro-esophageal reflux disease without esophagitis: Secondary | ICD-10-CM | POA: Diagnosis present

## 2016-01-08 DIAGNOSIS — I313 Pericardial effusion (noninflammatory): Secondary | ICD-10-CM | POA: Diagnosis present

## 2016-01-08 DIAGNOSIS — I5043 Acute on chronic combined systolic (congestive) and diastolic (congestive) heart failure: Secondary | ICD-10-CM | POA: Diagnosis present

## 2016-01-08 DIAGNOSIS — R441 Visual hallucinations: Secondary | ICD-10-CM | POA: Diagnosis present

## 2016-01-08 DIAGNOSIS — I5022 Chronic systolic (congestive) heart failure: Secondary | ICD-10-CM

## 2016-01-08 DIAGNOSIS — I748 Embolism and thrombosis of other arteries: Secondary | ICD-10-CM | POA: Diagnosis present

## 2016-01-08 DIAGNOSIS — K5900 Constipation, unspecified: Secondary | ICD-10-CM | POA: Diagnosis present

## 2016-01-08 DIAGNOSIS — I5033 Acute on chronic diastolic (congestive) heart failure: Secondary | ICD-10-CM | POA: Diagnosis present

## 2016-01-08 DIAGNOSIS — I272 Pulmonary hypertension, unspecified: Secondary | ICD-10-CM | POA: Diagnosis present

## 2016-01-08 DIAGNOSIS — I319 Disease of pericardium, unspecified: Secondary | ICD-10-CM | POA: Diagnosis not present

## 2016-01-08 DIAGNOSIS — D849 Immunodeficiency, unspecified: Secondary | ICD-10-CM | POA: Diagnosis present

## 2016-01-08 DIAGNOSIS — I3139 Other pericardial effusion (noninflammatory): Secondary | ICD-10-CM | POA: Diagnosis present

## 2016-01-08 DIAGNOSIS — I10 Essential (primary) hypertension: Secondary | ICD-10-CM | POA: Diagnosis present

## 2016-01-08 DIAGNOSIS — R6510 Systemic inflammatory response syndrome (SIRS) of non-infectious origin without acute organ dysfunction: Secondary | ICD-10-CM

## 2016-01-08 DIAGNOSIS — I110 Hypertensive heart disease with heart failure: Secondary | ICD-10-CM | POA: Diagnosis present

## 2016-01-08 DIAGNOSIS — Z7952 Long term (current) use of systemic steroids: Secondary | ICD-10-CM

## 2016-01-08 DIAGNOSIS — R05 Cough: Secondary | ICD-10-CM

## 2016-01-08 DIAGNOSIS — E876 Hypokalemia: Secondary | ICD-10-CM | POA: Diagnosis present

## 2016-01-08 DIAGNOSIS — Z7901 Long term (current) use of anticoagulants: Secondary | ICD-10-CM

## 2016-01-08 DIAGNOSIS — I159 Secondary hypertension, unspecified: Secondary | ICD-10-CM

## 2016-01-08 DIAGNOSIS — I481 Persistent atrial fibrillation: Secondary | ICD-10-CM | POA: Diagnosis present

## 2016-01-08 DIAGNOSIS — D899 Disorder involving the immune mechanism, unspecified: Secondary | ICD-10-CM

## 2016-01-08 DIAGNOSIS — E785 Hyperlipidemia, unspecified: Secondary | ICD-10-CM | POA: Diagnosis present

## 2016-01-08 DIAGNOSIS — J9601 Acute respiratory failure with hypoxia: Secondary | ICD-10-CM | POA: Diagnosis present

## 2016-01-08 DIAGNOSIS — R9431 Abnormal electrocardiogram [ECG] [EKG]: Secondary | ICD-10-CM | POA: Diagnosis present

## 2016-01-08 DIAGNOSIS — R079 Chest pain, unspecified: Secondary | ICD-10-CM

## 2016-01-08 DIAGNOSIS — Z79899 Other long term (current) drug therapy: Secondary | ICD-10-CM

## 2016-01-08 DIAGNOSIS — R0781 Pleurodynia: Secondary | ICD-10-CM | POA: Diagnosis present

## 2016-01-08 DIAGNOSIS — R197 Diarrhea, unspecified: Secondary | ICD-10-CM | POA: Diagnosis present

## 2016-01-08 DIAGNOSIS — D735 Infarction of spleen: Secondary | ICD-10-CM

## 2016-01-08 DIAGNOSIS — N280 Ischemia and infarction of kidney: Secondary | ICD-10-CM

## 2016-01-08 DIAGNOSIS — E7800 Pure hypercholesterolemia, unspecified: Secondary | ICD-10-CM | POA: Diagnosis present

## 2016-01-08 DIAGNOSIS — R059 Cough, unspecified: Secondary | ICD-10-CM

## 2016-01-08 DIAGNOSIS — A419 Sepsis, unspecified organism: Principal | ICD-10-CM | POA: Diagnosis present

## 2016-01-08 DIAGNOSIS — J189 Pneumonia, unspecified organism: Secondary | ICD-10-CM | POA: Diagnosis present

## 2016-01-08 DIAGNOSIS — Z8249 Family history of ischemic heart disease and other diseases of the circulatory system: Secondary | ICD-10-CM

## 2016-01-08 DIAGNOSIS — Z880 Allergy status to penicillin: Secondary | ICD-10-CM

## 2016-01-08 DIAGNOSIS — I4891 Unspecified atrial fibrillation: Secondary | ICD-10-CM | POA: Diagnosis not present

## 2016-01-08 LAB — COMPREHENSIVE METABOLIC PANEL
ALK PHOS: 50 U/L (ref 38–126)
ALT: 17 U/L (ref 14–54)
AST: 19 U/L (ref 15–41)
Albumin: 2.9 g/dL — ABNORMAL LOW (ref 3.5–5.0)
Anion gap: 7 (ref 5–15)
BUN: 8 mg/dL (ref 6–20)
CALCIUM: 7.6 mg/dL — AB (ref 8.9–10.3)
CO2: 24 mmol/L (ref 22–32)
CREATININE: 0.65 mg/dL (ref 0.44–1.00)
Chloride: 105 mmol/L (ref 101–111)
GFR calc non Af Amer: >60 mL/min (ref >60)
GLUCOSE: 112 mg/dL — AB (ref 65–99)
Potassium: 3.9 mmol/L (ref 3.5–5.1)
SODIUM: 136 mmol/L (ref 135–145)
Total Bilirubin: 0.9 mg/dL (ref 0.3–1.2)
Total Protein: 5.8 g/dL — ABNORMAL LOW (ref 6.5–8.1)

## 2016-01-08 LAB — HEPATIC FUNCTION PANEL
ALBUMIN: 3.7 g/dL (ref 3.5–5.0)
ALT: 25 U/L (ref 14–54)
AST: 29 U/L (ref 15–41)
Alkaline Phosphatase: 62 U/L (ref 38–126)
Bilirubin, Direct: 0.2 mg/dL (ref 0.1–0.5)
Indirect Bilirubin: 0.7 mg/dL (ref 0.3–0.9)
Total Bilirubin: 0.9 mg/dL (ref 0.3–1.2)
Total Protein: 7.3 g/dL (ref 6.5–8.1)

## 2016-01-08 LAB — URINALYSIS, ROUTINE W REFLEX MICROSCOPIC
Bilirubin Urine: NEGATIVE
Glucose, UA: NEGATIVE mg/dL
Hgb urine dipstick: NEGATIVE
Ketones, ur: NEGATIVE mg/dL
Leukocytes, UA: NEGATIVE
NITRITE: NEGATIVE
PH: 8 (ref 5.0–8.0)
Protein, ur: 100 mg/dL — AB
RBC / HPF: NONE SEEN RBC/hpf (ref 0–5)
Renal Epithelial: 5
SPECIFIC GRAVITY, URINE: 1.014 (ref 1.005–1.030)
Squamous Epithelial / LPF: NONE SEEN
WBC, UA: NONE SEEN WBC/hpf (ref 0–5)

## 2016-01-08 LAB — CBC WITH DIFFERENTIAL/PLATELET
Basophils Absolute: 0 10*3/uL (ref 0.0–0.1)
Basophils Relative: 0 %
EOS ABS: 0 10*3/uL (ref 0.0–0.7)
Eosinophils Relative: 0 %
HCT: 36.6 % (ref 36.0–46.0)
HEMOGLOBIN: 11.9 g/dL — AB (ref 12.0–15.0)
LYMPHS ABS: 1.2 10*3/uL (ref 0.7–4.0)
LYMPHS PCT: 7 %
MCH: 29.7 pg (ref 26.0–34.0)
MCHC: 32.5 g/dL (ref 30.0–36.0)
MCV: 91.3 fL (ref 78.0–100.0)
Monocytes Absolute: 0.4 10*3/uL (ref 0.1–1.0)
Monocytes Relative: 3 %
NEUTROS ABS: 14.6 10*3/uL — AB (ref 1.7–7.7)
NEUTROS PCT: 90 %
Platelets: 263 10*3/uL (ref 150–400)
RBC: 4.01 MIL/uL (ref 3.87–5.11)
RDW: 15.9 % — ABNORMAL HIGH (ref 11.5–15.5)
WBC: 16.2 10*3/uL — AB (ref 4.0–10.5)

## 2016-01-08 LAB — CBC
HCT: 42.2 % (ref 36.0–46.0)
HEMOGLOBIN: 13.7 g/dL (ref 12.0–15.0)
MCH: 29.7 pg (ref 26.0–34.0)
MCHC: 32.5 g/dL (ref 30.0–36.0)
MCV: 91.5 fL (ref 78.0–100.0)
PLATELETS: 302 10*3/uL (ref 150–400)
RBC: 4.61 MIL/uL (ref 3.87–5.11)
RDW: 16 % — AB (ref 11.5–15.5)
WBC: 18.8 10*3/uL — ABNORMAL HIGH (ref 4.0–10.5)

## 2016-01-08 LAB — I-STAT CG4 LACTIC ACID, ED
LACTIC ACID, VENOUS: 4.83 mmol/L — AB (ref 0.5–1.9)
Lactic Acid, Venous: 1.48 mmol/L (ref 0.5–1.9)

## 2016-01-08 LAB — BASIC METABOLIC PANEL
Anion gap: 12 (ref 5–15)
BUN: 11 mg/dL (ref 6–20)
CALCIUM: 8.9 mg/dL (ref 8.9–10.3)
CHLORIDE: 98 mmol/L — AB (ref 101–111)
CO2: 24 mmol/L (ref 22–32)
CREATININE: 0.81 mg/dL (ref 0.44–1.00)
GLUCOSE: 141 mg/dL — AB (ref 65–99)
POTASSIUM: 3.8 mmol/L (ref 3.5–5.1)
SODIUM: 134 mmol/L — AB (ref 135–145)

## 2016-01-08 LAB — PROTIME-INR
INR: 1.27
INR: 1.39
PROTHROMBIN TIME: 16 s — AB (ref 11.4–15.2)
Prothrombin Time: 17.2 seconds — ABNORMAL HIGH (ref 11.4–15.2)

## 2016-01-08 LAB — TROPONIN I
Troponin I: <0.03 ng/mL (ref <0.03)
Troponin I: <0.03 ng/mL (ref <0.03)

## 2016-01-08 LAB — BRAIN NATRIURETIC PEPTIDE
B NATRIURETIC PEPTIDE 5: 120.8 pg/mL — AB (ref 0.0–100.0)
B Natriuretic Peptide: 302 pg/mL — ABNORMAL HIGH (ref 0.0–100.0)

## 2016-01-08 LAB — TSH: TSH: 1.999 u[IU]/mL (ref 0.350–4.500)

## 2016-01-08 LAB — APTT: aPTT: 34 seconds (ref 24–36)

## 2016-01-08 LAB — I-STAT TROPONIN, ED: TROPONIN I, POC: 0 ng/mL (ref 0.00–0.08)

## 2016-01-08 LAB — LACTIC ACID, PLASMA: LACTIC ACID, VENOUS: 1.3 mmol/L (ref 0.5–1.9)

## 2016-01-08 LAB — PROCALCITONIN: PROCALCITONIN: 0.12 ng/mL

## 2016-01-08 LAB — CBG MONITORING, ED: GLUCOSE-CAPILLARY: 192 mg/dL — AB (ref 65–99)

## 2016-01-08 MED ORDER — SODIUM CHLORIDE 0.9 % IV BOLUS (SEPSIS)
1000.0000 mL | Freq: Once | INTRAVENOUS | Status: AC
  Administered 2016-01-08: 1000 mL via INTRAVENOUS

## 2016-01-08 MED ORDER — VITAMIN D3 25 MCG (1000 UNIT) PO TABS
2000.0000 [IU] | ORAL_TABLET | Freq: Every day | ORAL | Status: DC
  Administered 2016-01-09 – 2016-01-16 (×8): 2000 [IU] via ORAL
  Filled 2016-01-08 (×8): qty 2

## 2016-01-08 MED ORDER — ONDANSETRON HCL 4 MG/2ML IJ SOLN
4.0000 mg | Freq: Once | INTRAMUSCULAR | Status: AC
  Administered 2016-01-08: 4 mg via INTRAVENOUS
  Filled 2016-01-08: qty 2

## 2016-01-08 MED ORDER — VANCOMYCIN HCL IN DEXTROSE 750-5 MG/150ML-% IV SOLN
750.0000 mg | Freq: Two times a day (BID) | INTRAVENOUS | Status: DC
  Administered 2016-01-09 – 2016-01-11 (×6): 750 mg via INTRAVENOUS
  Filled 2016-01-08 (×7): qty 150

## 2016-01-08 MED ORDER — SODIUM CHLORIDE 0.9 % IV BOLUS (SEPSIS)
250.0000 mL | Freq: Once | INTRAVENOUS | Status: AC
  Administered 2016-01-08: 250 mL via INTRAVENOUS

## 2016-01-08 MED ORDER — DEXTROSE 5 % IV SOLN
1.0000 g | Freq: Three times a day (TID) | INTRAVENOUS | Status: AC
  Administered 2016-01-09 – 2016-01-12 (×12): 1 g via INTRAVENOUS
  Filled 2016-01-08 (×12): qty 1

## 2016-01-08 MED ORDER — ACETAMINOPHEN 325 MG PO TABS
650.0000 mg | ORAL_TABLET | Freq: Four times a day (QID) | ORAL | Status: DC | PRN
  Administered 2016-01-11 – 2016-01-15 (×4): 650 mg via ORAL
  Filled 2016-01-08 (×4): qty 2

## 2016-01-08 MED ORDER — VANCOMYCIN HCL IN DEXTROSE 1-5 GM/200ML-% IV SOLN: 1000.0000 mg | Freq: Once | INTRAVENOUS | Status: DC

## 2016-01-08 MED ORDER — IOPAMIDOL (ISOVUE-300) INJECTION 61%
100.0000 mL | Freq: Once | INTRAVENOUS | Status: AC | PRN
  Administered 2016-01-08: 100 mL via INTRAVENOUS

## 2016-01-08 MED ORDER — METRONIDAZOLE 500 MG PO TABS
500.0000 mg | ORAL_TABLET | Freq: Three times a day (TID) | ORAL | Status: AC
  Administered 2016-01-08 – 2016-01-13 (×16): 500 mg via ORAL
  Filled 2016-01-08 (×17): qty 1

## 2016-01-08 MED ORDER — ACETAMINOPHEN 325 MG PO TABS
650.0000 mg | ORAL_TABLET | Freq: Once | ORAL | Status: AC
  Administered 2016-01-08: 650 mg via ORAL
  Filled 2016-01-08: qty 2

## 2016-01-08 MED ORDER — PREDNISONE 5 MG PO TABS
2.5000 mg | ORAL_TABLET | Freq: Every day | ORAL | Status: DC
  Administered 2016-01-09 – 2016-01-12 (×4): 2.5 mg via ORAL
  Filled 2016-01-08 (×4): qty 1

## 2016-01-08 MED ORDER — SODIUM CHLORIDE 0.9 % IV SOLN: INTRAVENOUS | Status: AC

## 2016-01-08 MED ORDER — HEPARIN (PORCINE) IN NACL 100-0.45 UNIT/ML-% IJ SOLN
1000.0000 [IU]/h | INTRAMUSCULAR | Status: DC
  Administered 2016-01-08: 800 [IU]/h via INTRAVENOUS
  Filled 2016-01-08 (×2): qty 250

## 2016-01-08 MED ORDER — RISAQUAD PO CAPS
1.0000 | ORAL_CAPSULE | Freq: Every day | ORAL | Status: DC
  Administered 2016-01-09 – 2016-01-16 (×8): 1 via ORAL
  Filled 2016-01-08 (×9): qty 1

## 2016-01-08 MED ORDER — ENOXAPARIN SODIUM 40 MG/0.4ML ~~LOC~~ SOLN: 40.0000 mg | SUBCUTANEOUS | Status: DC

## 2016-01-08 MED ORDER — SODIUM CHLORIDE 0.9 % IV SOLN
INTRAVENOUS | Status: DC
  Administered 2016-01-09 – 2016-01-11 (×3): via INTRAVENOUS

## 2016-01-08 MED ORDER — ADULT MULTIVITAMIN W/MINERALS CH
1.0000 | ORAL_TABLET | Freq: Every day | ORAL | Status: DC
  Administered 2016-01-09 – 2016-01-16 (×8): 1 via ORAL
  Filled 2016-01-08 (×8): qty 1

## 2016-01-08 MED ORDER — PRAVASTATIN SODIUM 20 MG PO TABS
20.0000 mg | ORAL_TABLET | Freq: Every day | ORAL | Status: DC
  Administered 2016-01-09 – 2016-01-15 (×7): 20 mg via ORAL
  Filled 2016-01-08 (×8): qty 1

## 2016-01-08 MED ORDER — ACETAMINOPHEN 650 MG RE SUPP
650.0000 mg | Freq: Four times a day (QID) | RECTAL | Status: DC | PRN
  Administered 2016-01-09: 650 mg via RECTAL

## 2016-01-08 MED ORDER — WARFARIN SODIUM 5 MG PO TABS: 5.0000 mg | ORAL_TABLET | Freq: Every day | ORAL | Status: DC

## 2016-01-08 MED ORDER — WARFARIN SODIUM 2.5 MG PO TABS
7.5000 mg | ORAL_TABLET | ORAL | Status: AC
  Administered 2016-01-08: 7.5 mg via ORAL
  Filled 2016-01-08: qty 1

## 2016-01-08 MED ORDER — LEVALBUTEROL HCL 0.63 MG/3ML IN NEBU
0.6300 mg | INHALATION_SOLUTION | Freq: Four times a day (QID) | RESPIRATORY_TRACT | Status: DC | PRN
  Administered 2016-01-10: 0.63 mg via RESPIRATORY_TRACT
  Filled 2016-01-08: qty 3

## 2016-01-08 MED ORDER — GUAIFENESIN ER 600 MG PO TB12
600.0000 mg | ORAL_TABLET | Freq: Two times a day (BID) | ORAL | Status: DC | PRN
Start: 2016-01-08 — End: 2016-01-16
  Administered 2016-01-09 – 2016-01-13 (×2): 600 mg via ORAL
  Filled 2016-01-08 (×2): qty 1

## 2016-01-08 MED ORDER — FLUTICASONE PROPIONATE 50 MCG/ACT NA SUSP: 2.0000 | Freq: Every day | NASAL | Status: DC | PRN

## 2016-01-08 MED ORDER — VANCOMYCIN HCL IN DEXTROSE 1-5 GM/200ML-% IV SOLN
1000.0000 mg | Freq: Once | INTRAVENOUS | Status: AC
  Administered 2016-01-08: 1000 mg via INTRAVENOUS
  Filled 2016-01-08: qty 200

## 2016-01-08 MED ORDER — SENNOSIDES-DOCUSATE SODIUM 8.6-50 MG PO TABS: 1.0000 | ORAL_TABLET | Freq: Every evening | ORAL | Status: DC | PRN

## 2016-01-08 MED ORDER — FOLIC ACID 1 MG PO TABS
2.0000 mg | ORAL_TABLET | Freq: Every day | ORAL | Status: DC
  Administered 2016-01-09 – 2016-01-16 (×8): 2 mg via ORAL
  Filled 2016-01-08 (×8): qty 2

## 2016-01-08 MED ORDER — WARFARIN - PHARMACIST DOSING INPATIENT: Freq: Every day | Status: DC

## 2016-01-08 MED ORDER — SODIUM CHLORIDE 0.9 % IV BOLUS (SEPSIS): 1000.0000 mL | Freq: Once | INTRAVENOUS | Status: AC

## 2016-01-08 MED ORDER — CEFEPIME HCL 1 G IJ SOLR
1.0000 g | Freq: Once | INTRAMUSCULAR | Status: AC
  Administered 2016-01-08: 1 g via INTRAVENOUS
  Filled 2016-01-08: qty 1

## 2016-01-08 MED ORDER — FENTANYL CITRATE (PF) 100 MCG/2ML IJ SOLN
50.0000 ug | Freq: Once | INTRAMUSCULAR | Status: AC
  Administered 2016-01-08: 50 ug via INTRAVENOUS
  Filled 2016-01-08: qty 2

## 2016-01-08 MED ORDER — BISACODYL 5 MG PO TBEC: 5.0000 mg | DELAYED_RELEASE_TABLET | Freq: Every day | ORAL | Status: DC | PRN

## 2016-01-08 NOTE — ED Provider Notes (Signed)
[facility] DEPT Provider Note   CSN: [id number] Arrival date & time: 01/08/16  1214     History   Chief Complaint Chief Complaint  Patient presents with  . Fatigue  . Nausea  . Hallucinations    HPI [name] is a 75 y.o. female hx of afib on coumadin, HL, HTN, Recent pericardial effusion and pleural effusion, here presenting with fever, hallucinations, persistent vomiting and diarrhea. Patient had multiple admissions recently. She was admitted in late November and was found to have pericardial effusion that was drained. She then got discharged and was readmitted for diarrhea and was thought initially to had C. difficile but the toxin was negative. She was discharged about a week ago and saw the cardiologist about 5 days ago. For the last 3 days, patient has been very altered and having visual hallucinations. Patient has been seen people who were not there and just very confused. Patient also has a productive cough as well as vomiting and watery diarrhea. Daughter was concerned that she may have recurrent pneumonia or C. difficile again.  The history is provided by the EMS personnel and a relative.   Level V caveat- AMS   Past Medical History:  Diagnosis Date  . Acne rosacea   . Adhesive capsulitis of left shoulder 1980  . Allergic rhinitis   . Chronic atrial fibrillation ([place])    a. Coumadin anticoagulation - [facility] ([place])  . Chronic bronchitis ([place])   . Esophageal reflux   . Frequent epistaxis    "cause of my coumadin" (12/16/2015)  . Heart murmur   . History of echocardiogram    a. Echo 4/17:  EF 55-60%, no RWMA, mild MR, mild LAE, PASP 48 mmHg, trivial effusion post to heart  . Hypercholesterolemia   . Hyperlipidemia   . Hypertension   . Osteopenia   . Pleural effusion associated with pulmonary infection    Admx 5/17 >> required L thoracentesis (cytology neg for malignancy)   . Pneumonia    "I've had it 4 times this year" (12/16/2015)  .  Polymorphic light eruption 2004  . Prolonged QT interval   . Pulmonary HTN   . Respiratory failure ([place]) 04/2015   "spent 3 days; went home; then another 13 days in hospital" (12/16/2015  . Rheumatoid arthritis ([place]) 1975  . Right middle ear infection 1986  . Skin cancer    "burndt it off at the right side of my nose/face" (12/16/2015)    Patient Active Problem List   Diagnosis Date Noted  . Malnutrition of moderate degree 12/30/2015  . Immunosuppressed status ([place])   . Diarrhea 12/28/2015  . Fall 12/28/2015  . Chronic systolic CHF (congestive heart failure) ([place]) 12/28/2015  . Sepsis ([place]) 12/27/2015  . Pericardial effusion 12/15/2015  . GERD (gastroesophageal reflux disease) 12/15/2015  . Pulmonary hypertension 06/08/2015  . Persistent atrial fibrillation ([place])   . Long term (current) use of anticoagulants   . Long QT interval   . Chest tube in place   . Exudative pleural effusion   . HCAP (healthcare-associated pneumonia) 05/23/2015  . Acute respiratory failure with hypoxia ([place]) 05/23/2015  . Pleural effusion, left 05/23/2015  . Pleuritic chest pain 05/23/2015  . Acute respiratory failure ([place]) 05/23/2015  . Hypokalemia 04/24/2015  . Hypomagnesemia 04/24/2015  . QT prolongation 04/24/2015  . Rheumatoid arthritis ([place]) 04/24/2015  . Chest pain 08/03/2014  . CAP (community acquired pneumonia) 08/03/2014  . Encounter for therapeutic drug monitoring 07/14/2013  . Hypertension 05/15/2013  .  Hyperlipidemia 05/15/2013  . Ankle edema 05/15/2013  . Atrial fibrillation ([place]) 11/07/2012  . Long term current use of anticoagulant therapy 11/07/2012    Past Surgical History:  Procedure Laterality Date  . BREAST BIOPSY Left 2000s X 2  . CARDIAC CATHETERIZATION  03/2010   a. Myoview 2/09: no ischemia; low risk  //  b. [place] 3/12: normal coronary arteries  . CARDIAC CATHETERIZATION N/A 12/16/2015   Procedure: Pericardiocentesis;  Surgeon: Nelva Bush, MD;  Location: [facility];  Service: Cardiovascular;  Laterality: N/A;  . CARDIOVERSION  04/2010  . TEMPOROMANDIBULAR JOINT SURGERY Bilateral   . TOTAL ABDOMINAL HYSTERECTOMY      OB History    No data available       Home Medications    Prior to Admission medications   Medication Sig Start Date End Date Taking? Authorizing Provider  acidophilus (RISAQUAD) CAPS capsule Take 1 capsule by mouth daily.    Historical Provider, MD  cholecalciferol (VITAMIN D) 1000 UNITS tablet Take 2,000 Units by mouth daily.    Historical Provider, MD  etanercept (ENBREL) 25 MG injection Inject 25 mg into the skin every Friday.     Historical Provider, MD  fluticasone (FLONASE) 50 MCG/ACT nasal spray Place 2 sprays into both nostrils daily as needed for rhinitis.     Historical Provider, MD  folic acid (FOLVITE) 1 MG tablet Take 2 mg by mouth daily.     Historical Provider, MD  furosemide (LASIX) 40 MG tablet Take 40 mg by mouth every evening.    Historical Provider, MD  guaiFENesin (MUCINEX) 600 MG 12 hr tablet Take 600 mg by mouth 2 (two) times daily as needed for cough or to loosen phlegm.     Historical Provider, MD  methotrexate (RHEUMATREX) 2.5 MG tablet Take 2.5-7.5 mg by mouth 2 (two) times a week. Pt takes three tablets every Tuesday and two tablets every Wednesday.    Historical Provider, MD  metoprolol succinate (TOPROL-XL) 100 MG 24 hr tablet Take 1 tablet (100 mg total) by mouth 2 (two) times daily. Take with or immediately following a meal. 01/02/16   Florencia Reasons, MD  metroNIDAZOLE (FLAGYL) 500 MG tablet Take 1 tablet (500 mg total) by mouth 3 (three) times daily. 01/02/16 01/09/16  Florencia Reasons, MD  Multiple Vitamins-Minerals (MULTIVITAMIN WITH MINERALS) tablet Take 1 tablet by mouth daily.    Historical Provider, MD  potassium chloride (K-DUR,KLOR-CON) 10 MEQ tablet Take 10 mEq by mouth daily.    Historical Provider, MD  pravastatin (PRAVACHOL) 20 MG tablet Take 20 mg by mouth at bedtime.    Historical Provider,  MD  predniSONE (DELTASONE) 2.5 MG tablet Take 2.5 mg by mouth daily with breakfast.    Historical Provider, MD  ranitidine (ZANTAC) 150 MG tablet Take 1 tablet (150 mg total) by mouth at bedtime. 12/31/15   Florencia Reasons, MD  verapamil (CALAN) 120 MG tablet Take 1 tablet (120 mg total) by mouth every 12 (twelve) hours. 01/02/16   Florencia Reasons, MD  warfarin (COUMADIN) 5 MG tablet Take 1 tablet (5 mg total) by mouth daily. 01/04/16   Jettie Booze, MD    Family History Family History  Problem Relation Age of Onset  . Heart disease Father   . Heart disease Mother   . Other Mother     prolonged qtc  . Heart disease Sister   . Heart disease Sister   . Healthy Sister   . Fainting Neg Hx  Social History Social History  Substance Use Topics  . Smoking status: Never Smoker  . Smokeless tobacco: Never Used  . Alcohol use No     Allergies   Arthrotec [diclofenac-misoprostol]; Erythromycin; Morphine sulfate; Amoxicillin; Diltiazem hcl; Gold-containing drug products; Macrodantin [nitrofurantoin macrocrystal]; Naproxen; and Penicillins   Review of Systems Review of Systems  Constitutional: Positive for fever.  Respiratory: Positive for cough.   Neurological: Positive for weakness.  All other systems reviewed and are negative.    Physical Exam Updated Vital Signs BP 138/68 (BP Location: Left Arm)   Pulse 111   Temp 102 F (38.9 C) (Rectal)   Resp (!) 30   SpO2 95%   Physical Exam  Constitutional:  Uncomfortable, confused, dehydrated   HENT:  Head: Normocephalic.  Mm dry   Eyes: EOM are normal. Pupils are equal, round, and reactive to light.  Neck: Normal range of motion. Neck supple.  Cardiovascular:  Tachycardic, irregular   Pulmonary/Chest:  Tachypneic, + crackles bilateral bases   Abdominal: Soft. Bowel sounds are normal.  Mild diffuse tenderness, no rebound   Musculoskeletal: Normal range of motion.  Neurological: She is alert.  Confused, moving all extremities     Skin: Skin is warm.  Psychiatric:  Poor judgment, some visual hallucinations   Nursing note and vitals reviewed.    ED Treatments / Results  Labs (all labs ordered are listed, but only abnormal results are displayed) Labs Reviewed  BASIC METABOLIC PANEL - Abnormal; Notable for the following:       Result Value   Sodium 134 (*)    Chloride 98 (*)    Glucose, Bld 141 (*)    All other components within normal limits  CBC - Abnormal; Notable for the following:    WBC 18.8 (*)    RDW 16.0 (*)    All other components within normal limits  URINALYSIS, ROUTINE W REFLEX MICROSCOPIC - Abnormal; Notable for the following:    Color, Urine AMBER (*)    APPearance TURBID (*)    Protein, ur 100 (*)    Bacteria, UA RARE (*)    All other components within normal limits  PROTIME-INR - Abnormal; Notable for the following:    Prothrombin Time 16.0 (*)    All other components within normal limits  CBG MONITORING, ED - Abnormal; Notable for the following:    Glucose-Capillary 192 (*)    All other components within normal limits  I-STAT CG4 LACTIC ACID, ED - Abnormal; Notable for the following:    Lactic Acid, Venous 4.83 (*)    All other components within normal limits  CULTURE, BLOOD (ROUTINE X 2)  CULTURE, BLOOD (ROUTINE X 2)  C DIFFICILE QUICK SCREEN W PCR REFLEX  URINE CULTURE  HEPATIC FUNCTION PANEL  I-STAT TROPOININ, ED  I-STAT CG4 LACTIC ACID, ED    EKG  EKG Interpretation  Date/Time:  Saturday January 08 2016 13:48:05 EST Ventricular Rate:  109 PR Interval:    QRS Duration: 47 QT Interval:  408 QTC Calculation: 550 R Axis:   14 Text Interpretation:  afib  Nonspecific T abnrm, anterolateral leads Prolonged QT interval No significant change since last tracing Confirmed by YAO  MD, DAVID ([id number]) on 01/08/2016 2:12:21 PM       Radiology Dg Abd Acute W/chest  Result Date: 01/08/2016 CLINICAL DATA:  Abdominal pain, weakness, nausea/vomiting EXAM: DG ABDOMEN ACUTE W/  1V CHEST COMPARISON:  CT abdomen/pelvis dated 12/29/2015 FINDINGS: Small bilateral pleural effusions. Possible mild perihilar edema. Mild  left lower lobe opacity, likely atelectasis. Cardiomegaly. Nonobstructive bowel gas pattern. No evidence of free air on the lateral decubitus view. Mild degenerative changes the visualized thoracolumbar spine. IMPRESSION: Cardiomegaly with possible mild perihilar edema. Small bilateral pleural effusions. Mild left lower lobe opacity, likely atelectasis. No evidence of small bowel obstruction or free air. Electronically Signed   By: Julian Hy M.D.   On: 01/08/2016 13:48    Procedures Procedures (including critical care time)  Medications Ordered in ED Medications  ceFEPIme (MAXIPIME) 1 g in dextrose 5 % 50 mL IVPB (not administered)  acetaminophen (TYLENOL) tablet 650 mg (not administered)  sodium chloride 0.9 % bolus 1,000 mL (1,000 mLs Intravenous New Bag/Given 01/08/16 1407)  vancomycin (VANCOCIN) IVPB 1000 mg/200 mL premix (1,000 mg Intravenous New Bag/Given 01/08/16 1445)  fentaNYL (SUBLIMAZE) injection 50 mcg (50 mcg Intravenous Given 01/08/16 1520)  ondansetron (ZOFRAN) injection 4 mg (4 mg Intravenous Given 01/08/16 1520)     Initial Impression / Assessment and Plan / ED Course  I have reviewed the triage vital signs and the nursing notes.  Pertinent labs & imaging results that were available during my care of the patient were reviewed by me and considered in my medical decision making (see chart for details).  Clinical Course     [name] is a 75 y.o. female here with fever, hypoxia, vomiting, diarrhea. Recent complicated admission for pleural effusion, pericardial effusion, pneumonia, possible C diff. Differential include aspiration pneumonia, C diff, UTI. She is febrile, tachy, hypoxic. Will do sepsis workup. She was treated with vanc/cefepime/flagyl during last admission so will initiate that again.   3:50 PM WBC 18, lactate  3.8. Given IVF and abx. UA clear. CXR showed LLL opacity. Concerned for possible HCAP. C diff pending. Will admit for sepsis from HCAP vs C diff. Hospitalist request CT head, chest/ab/pel and will follow up results. Will admit to stepdown   Final Clinical Impressions(s) / ED Diagnoses   Final diagnoses:  None    New Prescriptions New Prescriptions   No medications on file     Drenda Freeze, MD 01/08/16 1616

## 2016-01-08 NOTE — ED Notes (Signed)
3RD IV site started d/t pt c/o pain to her whole RT arm.  The RT upper arm has a "hard knot" about the size of an elongated baseball that the daughter states was from an old infiltration.  The IV site seems to be fine, doesn't seem to be infiltrated but the 3rd IV site started to be sure. Blood was obtained from the 3rd IV site for blood cultures.  Pt is wiggly and continues to say "I am so sick" she is not oriented except to self.

## 2016-01-08 NOTE — Progress Notes (Signed)
ANTICOAGULATION CONSULT NOTE - Initial Consult  Pharmacy Consult for IV heparin/warfarin Indication: atrial fibrillation  Allergies  Allergen Reactions  . Arthrotec [Diclofenac-Misoprostol] Diarrhea  . Erythromycin Anaphylaxis       . Morphine Sulfate Other (See Comments)    Reaction:  Hallucinations   . Amoxicillin Rash and Other (See Comments)    Has patient had a PCN reaction causing immediate rash, facial/tongue/throat swelling, SOB or lightheadedness with hypotension: Yes Has patient had a PCN reaction causing severe rash involving mucus membranes or skin necrosis: Yes Has patient had a PCN reaction that required hospitalization No Has patient had a PCN reaction occurring within the last 10 years: No If all of the above answers are "NO", then may proceed with Cephalosporin use.  . Diltiazem Hcl Swelling, Rash and Other (See Comments)    Pt is able to tolerate Verapamil.    . Gold-Containing Drug Products Rash  . Macrodantin [Nitrofurantoin Macrocrystal] Rash  . Naproxen Rash  . Penicillins Rash and Other (See Comments)    ++ tolerates cefepime and ceftriaxone ++ Has patient had a PCN reaction causing immediate rash, facial/tongue/throat swelling, SOB or lightheadedness with hypotension: Yes Has patient had a PCN reaction causing severe rash involving mucus membranes or skin necrosis: Yes Has patient had a PCN reaction that required hospitalization No Has patient had a PCN reaction occurring within the last 10 years: No If all of the above answers are "NO", then may proceed with Cephalosporin use.     Patient Measurements: Height: 5\' 3"  (160 cm) Weight: 145 lb 8.1 oz (66 kg) IBW/kg (Calculated) : 52.4 Heparin Dosing Weight: 56 kg  Vital Signs: Temp: 98.5 F (36.9 C) (12/23 1600) Temp Source: Rectal (12/23 1406) BP: 112/67 (12/23 1800) Pulse Rate: 130 (12/23 1800)  Labs:  Recent Labs  01/08/16 1257  HGB 13.7  HCT 42.2  PLT 302  LABPROT 16.0*  INR 1.27   CREATININE 0.81    Estimated Creatinine Clearance: 55.6 mL/min (by C-G formula based on SCr of 0.81 mg/dL).   Medical History: Past Medical History:  Diagnosis Date  . Acne rosacea   . Adhesive capsulitis of left shoulder 1980  . Allergic rhinitis   . Chronic atrial fibrillation ([place])    a. Coumadin anticoagulation - [facility] ([place])  . Chronic bronchitis ([place])   . Esophageal reflux   . Frequent epistaxis    "cause of my coumadin" (12/16/2015)  . Heart murmur   . History of echocardiogram    a. Echo 4/17:  EF 55-60%, no RWMA, mild MR, mild LAE, PASP 48 mmHg, trivial effusion post to heart  . Hypercholesterolemia   . Hyperlipidemia   . Hypertension   . Osteopenia   . Pleural effusion associated with pulmonary infection    Admx 5/17 >> required L thoracentesis (cytology neg for malignancy)   . Pneumonia    "I've had it 4 times this year" (12/16/2015)  . Polymorphic light eruption 2004  . Prolonged QT interval   . Pulmonary HTN   . Respiratory failure ([place]) 04/2015   "spent 3 days; went home; then another 13 days in hospital" (12/16/2015  . Rheumatoid arthritis ([place]) 1975  . Right middle ear infection 1986  . Skin cancer    "burndt it off at the right side of my nose/face" (12/16/2015)    Medications:  Scheduled:  . sodium chloride   Intravenous STAT  . [START ON 01/09/2016] acidophilus  1 capsule Oral Daily  . [START ON 01/09/2016] ceFEPime (  MAXIPIME) IV  1 g Intravenous Q8H  . [START ON 01/09/2016] cholecalciferol  2,000 Units Oral Daily  . [START ON [id number] folic acid  2 mg Oral Daily  . metroNIDAZOLE  500 mg Oral TID  . [START ON 01/09/2016] multivitamin with minerals  1 tablet Oral Daily  . pravastatin  20 mg Oral QHS  . [START ON 01/09/2016] predniSONE  2.5 mg Oral Q breakfast  . sodium chloride  250 mL Intravenous Once  . [START ON 01/09/2016] vancomycin  750 mg Intravenous Q12H  . warfarin  7.5 mg Oral NOW  . [START ON 01/09/2016] Warfarin -  Pharmacist Dosing Inpatient   Does not apply q1800   Infusions:  . sodium chloride    . heparin      Assessment: 24 yoF on chronic warfarin for A-fib being held since 12/19 per Cards d/t bloody pericardial effusion.  CT of abd/pelvis shows new renal and splenic infarcts.  MD wants to resume warfarin and add IV heparin per Rx. Goal of Therapy:  INR 2-3 Heparin level 0.3-0.7 units/ml Monitor platelets by anticoagulation protocol: Yes   Plan:  Baseline aPtt/PT/INR stat Start heparin drip at 800 units/hr Warfarin 7.5 mg x1 now Daily CBC/HL/PT/INR Check 1st HL in 8 hours  Dorrene German 01/08/2016,7:10 PM

## 2016-01-08 NOTE — ED Notes (Signed)
Reatha Harps (daughter) [phone],  [phone].   She has to leave for a bit but can be called on either cell phone if needed.

## 2016-01-08 NOTE — Progress Notes (Signed)
Patient's CT of the Abd/Pelvis Came back and it shows new Renal and Splenic Infarcts. Case was discussed with Cardiology Dr. Kenton Kingfisher who recommended starting Heparin gtt for Anticoagulation. [facility] will be consulted in Am for additional recommendations and evaluation. Findings were discussed with the patient. Patient's IV also infiltrated where the dye was given. IV is to be removed and arm is to be elevated. Continue to monitor closely.

## 2016-01-08 NOTE — H&P (Signed)
History and Physical    [name] [id number] DOB: 04/17/1940 DOA: 01/08/2016  PCP: Leeroy Cha, MD   Patient coming from: Home  Chief Complaint: Not Feeling Well; Fevers, Diarrhea, Cough  HPI: [name] is a 75 y.o. 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 Patient was discharged home and did well the first few days but then states lost her appetite and started coughing and had worsening diarrhea over the course of the week. Patient states she became more fatigued and developed fevers and did not want to come back to the ER at the urge of her daughter. She Admitted to Nausea but no vomiting and states she has chest wall pain and tenderness from coughing. Patient thinks liquidly diarrhea is getting worse and has had countless episodes to the point where she defecates on herself. No blood in stool noted. Has had fevers and chills at home and has worsened since discharge last Sunday. Per daughter has been so weak and has only lying in bed and not been eating. Saw her Cardiologist 4 days ago and states that when she started feeling worse. States feels very similar to last hospitalization. No other concerns or complaints at this time. Hospitalist was called to Admit because of Sepsis.   ED Course: Started on IVF and was Cx'd. Had Abdominal and CXR done.   Review of Systems: As per HPI otherwise 10 point review of systems negative. Admitted to having a headache and coughing but not having a productive sputum. Denied any wheezing or palpitations but has chronic A. Fib.   Past Medical History:  Diagnosis Date  . Acne rosacea   . Adhesive capsulitis of left shoulder 1980  . Allergic rhinitis   .  Chronic atrial fibrillation ([place])    a. Coumadin anticoagulation - [facility] ([place])  . Chronic bronchitis ([place])   . Esophageal reflux   . Frequent epistaxis    "cause of my coumadin" (12/16/2015)  . Heart murmur   . History of echocardiogram    a. Echo 4/17:  EF 55-60%, no RWMA, mild MR, mild LAE, PASP 48 mmHg, trivial effusion post to heart  . Hypercholesterolemia   . Hyperlipidemia   . Hypertension   . Osteopenia   . Pleural effusion associated with pulmonary infection    Admx 5/17 >> required L thoracentesis (cytology neg for malignancy)   . Pneumonia    "I've had it 4 times this year" (12/16/2015)  . Polymorphic light eruption 2004  . Prolonged QT interval   . Pulmonary HTN   . Respiratory failure ([place]) 04/2015   "spent 3 days; went home; then another 13 days in hospital" (12/16/2015  . Rheumatoid arthritis ([place]) 1975  . Right middle ear infection 1986  . Skin cancer    "burndt it off at the right side of my nose/face" (12/16/2015)    Past Surgical History:  Procedure Laterality Date  . BREAST BIOPSY Left 2000s X 2  . CARDIAC CATHETERIZATION  03/2010   a. [place] 2/09: no ischemia; low risk  //  b. [place] 3/12: normal coronary arteries  . CARDIAC CATHETERIZATION N/A 12/16/2015   Procedure: Pericardiocentesis;  Surgeon: Nelva Bush, MD;  Location: [place] [facility];  Service: Cardiovascular;  Laterality: N/A;  . CARDIOVERSION  04/2010  . TEMPOROMANDIBULAR JOINT SURGERY Bilateral   . TOTAL ABDOMINAL HYSTERECTOMY     SOCIAL HISTORY  reports that she has never smoked. She has never used smokeless tobacco. She reports that she does not drink alcohol or use drugs.  Allergies  Allergen Reactions  . Arthrotec [Diclofenac-Misoprostol] Diarrhea  . Erythromycin Anaphylaxis       . Morphine Sulfate Other (See Comments)    Reaction:  Hallucinations   . Amoxicillin Rash and Other (See Comments)    Has patient had a PCN reaction causing immediate rash,  facial/tongue/throat swelling, SOB or lightheadedness with hypotension: Yes Has patient had a PCN reaction causing severe rash involving mucus membranes or skin necrosis: Yes Has patient had a PCN reaction that required hospitalization No Has patient had a PCN reaction occurring within the last 10 years: No If all of the above answers are "NO", then may proceed with Cephalosporin use.  . Diltiazem Hcl Swelling, Rash and Other (See Comments)    Pt is able to tolerate Verapamil.    . Gold-Containing Drug Products Rash  . Macrodantin [Nitrofurantoin Macrocrystal] Rash  . Naproxen Rash  . Penicillins Rash and Other (See Comments)    ++ tolerates cefepime and ceftriaxone ++ Has patient had a PCN reaction causing immediate rash, facial/tongue/throat swelling, SOB or lightheadedness with hypotension: Yes Has patient had a PCN reaction causing severe rash involving mucus membranes or skin necrosis: Yes Has patient had a PCN reaction that required hospitalization No Has patient had a PCN reaction occurring within the last 10 years: No If all of the above answers are "NO", then may proceed with Cephalosporin use.     Family History  Problem Relation Age of Onset  . Heart disease Father   . Heart disease Mother   . Other Mother     prolonged qtc  . Heart disease Sister   . Heart disease Sister   . Healthy Sister   . Fainting Neg Hx    Prior to Admission medications   Medication Sig Start Date End Date Taking? Authorizing Provider  acidophilus (RISAQUAD) CAPS capsule Take 1 capsule by mouth daily.    Historical Provider, MD  cholecalciferol (VITAMIN D) 1000 UNITS tablet Take 2,000 Units by mouth daily.    Historical Provider, MD  COLCRYS 0.6 MG tablet Take 0.6 mg by mouth daily. 12/19/15   Historical Provider, MD  etanercept (ENBREL) 25 MG injection Inject 25 mg into the skin every Friday.     Historical Provider, MD  fluticasone (FLONASE) 50 MCG/ACT nasal spray Place 2 sprays into both  nostrils daily as needed for rhinitis.     Historical Provider, MD  folic acid (FOLVITE) 1 MG tablet Take 2 mg by mouth daily.     Historical Provider, MD  furosemide (LASIX) 40 MG tablet Take 40 mg by mouth every evening.    Historical Provider, MD  guaiFENesin (MUCINEX) 600 MG 12 hr tablet Take 600 mg by mouth 2 (two) times daily as needed for cough or to loosen phlegm.     Historical Provider, MD  methotrexate (RHEUMATREX) 2.5 MG tablet Take 2.5-7.5 mg by mouth 2 (two) times a week. Pt takes three tablets every Tuesday and two tablets every Wednesday.    Historical Provider, MD  metoprolol succinate (TOPROL-XL) 100 MG 24 hr tablet Take 1 tablet (100 mg total) by mouth 2 (two) times daily. Take with or immediately following a meal. 01/02/16   Florencia Reasons, MD  metroNIDAZOLE (FLAGYL) 500 MG tablet  Take 1 tablet (500 mg total) by mouth 3 (three) times daily. 01/02/16 01/09/16  Florencia Reasons, MD  Multiple Vitamins-Minerals (MULTIVITAMIN WITH MINERALS) tablet Take 1 tablet by mouth daily.    Historical Provider, MD  potassium chloride (K-DUR,KLOR-CON) 10 MEQ tablet Take 10 mEq by mouth daily.    Historical Provider, MD  pravastatin (PRAVACHOL) 20 MG tablet Take 20 mg by mouth at bedtime.    Historical Provider, MD  predniSONE (DELTASONE) 2.5 MG tablet Take 2.5 mg by mouth daily with breakfast.    Historical Provider, MD  ranitidine (ZANTAC) 150 MG tablet Take 1 tablet (150 mg total) by mouth at bedtime. 12/31/15   Florencia Reasons, MD  verapamil (CALAN) 120 MG tablet Take 1 tablet (120 mg total) by mouth every 12 (twelve) hours. 01/02/16   Florencia Reasons, MD  warfarin (COUMADIN) 5 MG tablet Take 1 tablet (5 mg total) by mouth daily. 01/04/16   Jettie Booze, MD   Physical Exam: Vitals:   01/08/16 1540 01/08/16 1600 01/08/16 1715 01/08/16 1724  BP: 138/68 107/76  105/83  Pulse: 111 119 120 (!) 123  Resp: (!) 30 (!) 31 (!) 28 25  Temp:      TempSrc:      SpO2: 95% 92% 96% 95%   Constitutional: Caucasian female in  mild distress  Eyes: Lids and conjunctivae normal, sclerae anicteric  ENMT: External Ears, Nose appear normal. Grossly normal hearing.  Neck: Appears normal, supple, no cervical masses, normal ROM, no appreciable thyromegaly, no JVD.  Respiratory: Diminished to auscultation bilaterally, No wheezing, rales, rhonchi. Mild crackles. Slightly increased respiratory effort and patient is Slightly tachypenic. No accessory muscle use.  Cardiovascular: Irregularly Irregular, no murmurs / rubs / gallops. S1 and S2 auscultated. No extremity edema. Abdomen: Soft, mildly tender on palpation, non-distended. No masses palpated. No appreciable hepatosplenomegaly. Bowel sounds positive x4.  GU: Deferred. Musculoskeletal: No clubbing / cyanosis of digits/nails. No joint deformity upper and lower extremities.  Skin: No rashes, lesions, ulcers on limited skin evaluation. No induration; Warm and dry.  Neurologic: CN 2-12 grossly intact with no focal deficits. Sensation intact in all 4 Extremities. Romberg sign cerebellar reflexes not assessed.  Psychiatric: Normal judgment and insight. Alert and oriented x 3. Normal mood and appropriate affect.   Labs on Admission: I have personally reviewed following labs and imaging studies  CBC:  Recent Labs Lab 01/08/16 1257  WBC 18.8*  HGB 13.7  HCT 42.2  MCV 91.5  PLT 99991111   Basic Metabolic Panel:  Recent Labs Lab 01/02/16 0508 01/08/16 1257  NA 141 134*  K 3.7 3.8  CL 104 98*  CO2 31 24  GLUCOSE 89 141*  BUN 11 11  CREATININE 0.76 0.81  CALCIUM 9.2 8.9  MG 1.9  --    GFR: Estimated Creatinine Clearance: 55.4 mL/min (by C-G formula based on SCr of 0.81 mg/dL). Liver Function Tests:  Recent Labs Lab 01/08/16 1257  AST 29  ALT 25  ALKPHOS 62  BILITOT 0.9  PROT 7.3  ALBUMIN 3.7   No results for input(s): LIPASE, AMYLASE in the last 168 hours. No results for input(s): AMMONIA in the last 168 hours. Coagulation Profile:  Recent Labs Lab  01/08/16 1257  INR 1.27   Cardiac Enzymes: No results for input(s): CKTOTAL, CKMB, CKMBINDEX, TROPONINI in the last 168 hours. BNP (last 3 results)  Recent Labs  12/13/15 1704  PROBNP 215.0*   HbA1C: No results for input(s): HGBA1C in the last 72 hours. CBG:  Recent Labs Lab 01/02/16 0802 01/08/16 1513  GLUCAP 153* 192*   Lipid Profile: No results for input(s): CHOL, HDL, LDLCALC, TRIG, CHOLHDL, LDLDIRECT in the last 72 hours. Thyroid Function Tests: No results for input(s): TSH, T4TOTAL, FREET4, T3FREE, THYROIDAB in the last 72 hours. Anemia Panel: No results for input(s): VITAMINB12, FOLATE, FERRITIN, TIBC, IRON, RETICCTPCT in the last 72 hours. Urine analysis:    Component Value Date/Time   COLORURINE AMBER (A) 01/08/2016 1507   APPEARANCEUR TURBID (A) 01/08/2016 1507   LABSPEC 1.014 01/08/2016 1507   PHURINE 8.0 01/08/2016 1507   GLUCOSEU NEGATIVE 01/08/2016 1507   HGBUR NEGATIVE 01/08/2016 1507   BILIRUBINUR NEGATIVE 01/08/2016 1507   KETONESUR NEGATIVE 01/08/2016 1507   PROTEINUR 100 (A) 01/08/2016 1507   UROBILINOGEN 0.2 10/29/2013 1932   NITRITE NEGATIVE 01/08/2016 1507   LEUKOCYTESUR NEGATIVE 01/08/2016 1507   Sepsis Labs: !!!!!!!!!!!!!!!!!!!!!!!!!!!!!!!!!!!!!!!!!!!! @LABRCNTIP (procalcitonin:4,lacticidven:4) ) Recent Results (from the past 240 hour(s))  MRSA PCR Screening     Status: None   Collection Time: 12/30/15  1:10 PM  Result Value Ref Range Status   MRSA by PCR NEGATIVE NEGATIVE Final    Comment:        The GeneXpert MRSA Assay (FDA approved for NASAL specimens only), is one component of a comprehensive MRSA colonization surveillance program. It is not intended to diagnose MRSA infection nor to guide or monitor treatment for MRSA infections.   Blood culture (routine x 2)     Status: None (Preliminary result)   Collection Time: 01/08/16  2:14 PM  Result Value Ref Range Status   Specimen Description   Final    BLOOD LEFT  FOREARM Performed at [facility]    Special Requests IN PEDIATRIC BOTTLE [facility]  Final   Culture PENDING  Incomplete   Report Status PENDING  Incomplete    Radiological Exams on Admission: Dg Abd Acute W/chest  Result Date: 01/08/2016 CLINICAL DATA:  Abdominal pain, weakness, nausea/vomiting EXAM: DG ABDOMEN ACUTE W/ 1V CHEST COMPARISON:  CT abdomen/pelvis dated 12/29/2015 FINDINGS: Small bilateral pleural effusions. Possible mild perihilar edema. Mild left lower lobe opacity, likely atelectasis. Cardiomegaly. Nonobstructive bowel gas pattern. No evidence of free air on the lateral decubitus view. Mild degenerative changes the visualized thoracolumbar spine. IMPRESSION: Cardiomegaly with possible mild perihilar edema. Small bilateral pleural effusions. Mild left lower lobe opacity, likely atelectasis. No evidence of small bowel obstruction or free air. Electronically Signed   By: Julian Hy M.D.   On: 01/08/2016 13:48    EKG: Independently reviewed. Showed Irregularly Irregular rhythm with an approximate rate of 109. No ST-Elevation or Depression however qTC is elongated at 550. No Ashman Complexes seen.   Assessment/Plan Active Problems:   Atrial fibrillation (HCC)   Hypertension   Hyperlipidemia   Chest pain   QT prolongation   Rheumatoid arthritis (HCC)   Pleuritic chest pain   Persistent atrial fibrillation (HCC)   Pulmonary hypertension   Sepsis (HCC)   Diarrhea   Chronic systolic CHF (congestive heart failure) ([place])   Immunosuppressed status ([place])  Sepsis of Unclear Etiology possible from Health Care Acquired Pneumonia vs. Gastrointestinal Illness r/o C Diff -Admit to SDU -Patient is Septic on Admission with Fever of 102, Tachycardia, Tachypnea, Leukocytosis of 18.8 and Lactic Acid Level of 4.83. Etiology is not clear at this point and CT of Chest/Abd/Pelvis Pending.  -Possibly from Pneumonia vs. C. Difficile Colitis? -Sepsis Bundle Protocol -Check  Procalcitonin and Repeat Lactic Acid Level per Protocol - Repeat Lactic Acid was  1.48 -Check C. Difficile PCR Toxin and Antigen -Blood Cx's x2, Sputum Cx; Urinalysis Negative  -Urine Cx Pending -Normal Saline (30 mL/kg) initial Bolus and then 100 mL/hr -Chest and Abdominal Imaging X-Ray showed Cardiomegaly with possible mild perihilar edema. Small bilateral pleural effusions.Mild left lower lobe opacity, likely atelectasis. No evidence of small bowel obstruction or free air. -Repeat CXR in AM -C/w IV Vancomycin/IV Cefepime/ and po Metronidazole -Follow blood culture, urine culture and sputum Cx  Diarrhea r/o C. Difficile:  -Enteric Percautions -CT of Abd/Pelvis Pending -C Diff PCR ordered;  -C/w Home Metronidazole 500 mg po q8h and Acidophilus  Cough with Non-Productive Sputum r/o HCAP -CT of Chest Pending -Will Add Xopenex q6hprn -IV Vanc and Cefepime per Pharmacy Dosing  Visual Hallucinations -Head CT Ordered and Pending -Avoid Sedatives  Mild Hyponatremia -Patients Na+ was 134 -Repeat CMP in AM  Atypical CP r/o ACS -Troponin I x 3 -Repeat EKG -CT of Chest to r/o other Causes  Prolonged qTC -EKG this Admission was 550 -Check EKG in AM -Avoid Zofran, Phenergan, Ranitidine  -Monitor SDU with Telemetry  Generalized Weakness and Deconditioning -PT/OT Evaluation -C/w IVF  -Nutritionist Consult  Pericardial Effusion:  -S/p of pericardiocentesis and Pericardial drain placement with removal of 500 mL of Dark Sanguinous Fluid during the Admission of 11/29-12/3. -She underwent repeated echocardiograms without significant fluid accumulation before discharge. Had Echo Done prior to Last Discharge. -Did not want Colchicine for Pericarditis colchicine -CT of the Chest -Will possibly repeat Limited Echocardiogram; Last Echocardiogram 12/29/15 showed small posterior pericardial effusion  Atrial Fibrillation:  -Recently Saw Dr. Irish Lack on 01/04/16 -CHA2DS2-VASc Scoreis 3 -  Was on Oral Anticoagulation with Coumadin *currently held due to Bloody Pericardial Effusion -Coumadin was held because of Bloody Pericardial Effusion; Per notes was to be started During the First Week of January -Hold Home Metoprolol and Verapamil for now given Sepsis -C/w Telemetry Monitoring -Will need Limited ECHOcardiogram at the end of January for Pericardial Effusion -May benefit from Cardiology Consultation  Rheumatoid Arthritis:  -On prednisone 2.5 mg daily and continued,  -Hold Home Methotrexate and Etenercept. Clinically stable. -Check Cortisol level -May Need Stress Dose Steroids  HLD: -Last LDL was79 on 05/21/13 -Continue home medications:Pravastatin   GERD: -Avoid PPI and H2 Blocker in the Setting of Possible C.Diff and qTC prolongation  HTN: -Hold all Antihypertensive Meds -Continue to Monitor Closely  Chronic systolic congestive heart failure:  -Transthoracic ECHO on 12/29/15 showed EF of 50-55 %. However previous [facility] showed EF of 40-45%.  -Check BNP -Hold Lasix due to sepsis  DVT prophylaxis: SCD's Code Status: FULL CODE Family Communication: Discussed with Patients Daughter at bedside Disposition Plan: Step Down Unit Consults called: None Admission status: Inpatient   Kerney Elbe, D.O. [facility] Pager [phone]  If 7PM-7AM, please contact night-coverage [email or web address] Password [facility]  01/08/2016, 5:29 PM

## 2016-01-08 NOTE — Progress Notes (Signed)
Pharmacy Antibiotic Note  [name] is a 76 y.o. female  with medical history significant of hypertension, hyperlipidemia, GERD, rheumatoid arthritis, pulmonary hypertension, atrial fibrillation.  This is the 3rd admission in Dec. Admitted on 01/08/2016 with sepsis.  Pharmacy has been consulted for vanc and cefepime dosing.  First doses given in ED  Plan: Cefepime 1gm IV q8h Vancomycin 1gm IV in ED then 750mg  IV q12h     Temp (24hrs), Avg:100.7 F (38.2 C), Min:99.4 F (37.4 C), Max:102 F (38.9 C)   Recent Labs Lab 01/02/16 0508 01/08/16 1257 01/08/16 1349 01/08/16 1628  WBC  --  18.8*  --   --   CREATININE 0.76 0.81  --   --   LATICACIDVEN  --   --  4.83* 1.48    Estimated Creatinine Clearance: 55.4 mL/min (by C-G formula based on SCr of 0.81 mg/dL).    Allergies  Allergen Reactions  . Arthrotec [Diclofenac-Misoprostol] Diarrhea  . Erythromycin Anaphylaxis       . Morphine Sulfate Other (See Comments)    Reaction:  Hallucinations   . Amoxicillin Rash and Other (See Comments)    Has patient had a PCN reaction causing immediate rash, facial/tongue/throat swelling, SOB or lightheadedness with hypotension: Yes Has patient had a PCN reaction causing severe rash involving mucus membranes or skin necrosis: Yes Has patient had a PCN reaction that required hospitalization No Has patient had a PCN reaction occurring within the last 10 years: No If all of the above answers are "NO", then may proceed with Cephalosporin use.  . Diltiazem Hcl Swelling, Rash and Other (See Comments)    Pt is able to tolerate Verapamil.    . Gold-Containing Drug Products Rash  . Macrodantin [Nitrofurantoin Macrocrystal] Rash  . Naproxen Rash  . Penicillins Rash and Other (See Comments)    ++ tolerates cefepime and ceftriaxone ++ Has patient had a PCN reaction causing immediate rash, facial/tongue/throat swelling, SOB or lightheadedness with hypotension: Yes Has patient had a PCN reaction  causing severe rash involving mucus membranes or skin necrosis: Yes Has patient had a PCN reaction that required hospitalization No Has patient had a PCN reaction occurring within the last 10 years: No If all of the above answers are "NO", then may proceed with Cephalosporin use.     Antimicrobials this admission: 12/23 cefepime >> 12/23 vancomycin >>    Microbiology results: 12/23 BCx:  12/23 UCx:    Thank you for allowing pharmacy to be a part of this patient's care.  Dolly Rias RPh 01/08/2016, 5:22 PM Pager [phone]

## 2016-01-08 NOTE — ED Notes (Signed)
Pt has been given pain and nausea meds and is less "wiggly" in the bed.  Lights out for comfort and door open to observe from hallway since the daughter has left the room for a little bit.

## 2016-01-08 NOTE — ED Notes (Signed)
PATIENT IN XRAY.

## 2016-01-08 NOTE — ED Notes (Signed)
Pt is resting.  Daughter is no longer at bedside.  Pt is much less fidgety than she was earlier in her stay.  Awaiting CT scans to be done.

## 2016-01-08 NOTE — ED Triage Notes (Signed)
Patient BIB daughter, reports fatigue and nausea. Recently admitted for same. States she is not feeling any better. Reports 4 episodes of emesis since yesterday. Daughter reports patient is hallucinating and "talking to people who are not there" x3 days.

## 2016-01-08 NOTE — ED Notes (Signed)
Assumed care of patient from [facility].

## 2016-01-08 NOTE — ED Notes (Addendum)
Pt is now going to 1228.  Report to Bri, RN in ICU.  Bri is aware the pt is still needing to go to CT and has requested to have a "20 minute timer" prior to sending pt upstairs.

## 2016-01-08 NOTE — ED Notes (Signed)
On arrival to room the area that was stuck for labs just above the IV site was found to be leaking the infusing IVF's.  Will leave IV in place to see if the "stick site" will heal.  A new IV was started but blood was not able to be drawn from the site.

## 2016-01-09 ENCOUNTER — Inpatient Hospital Stay (HOSPITAL_COMMUNITY): Payer: Medicare Other

## 2016-01-09 DIAGNOSIS — I272 Pulmonary hypertension, unspecified: Secondary | ICD-10-CM

## 2016-01-09 DIAGNOSIS — R0781 Pleurodynia: Secondary | ICD-10-CM

## 2016-01-09 DIAGNOSIS — I4891 Unspecified atrial fibrillation: Secondary | ICD-10-CM

## 2016-01-09 LAB — COMPREHENSIVE METABOLIC PANEL
ALK PHOS: 55 U/L (ref 38–126)
ALT: 28 U/L (ref 14–54)
ANION GAP: 7 (ref 5–15)
AST: 42 U/L — ABNORMAL HIGH (ref 15–41)
Albumin: 3 g/dL — ABNORMAL LOW (ref 3.5–5.0)
BILIRUBIN TOTAL: 1 mg/dL (ref 0.3–1.2)
BUN: 6 mg/dL (ref 6–20)
CALCIUM: 8.1 mg/dL — AB (ref 8.9–10.3)
CO2: 21 mmol/L — ABNORMAL LOW (ref 22–32)
Chloride: 107 mmol/L (ref 101–111)
Creatinine, Ser: 0.59 mg/dL (ref 0.44–1.00)
GFR calc non Af Amer: >60 mL/min (ref >60)
Glucose, Bld: 112 mg/dL — ABNORMAL HIGH (ref 65–99)
Potassium: 3.9 mmol/L (ref 3.5–5.1)
SODIUM: 135 mmol/L (ref 135–145)
TOTAL PROTEIN: 6 g/dL — AB (ref 6.5–8.1)

## 2016-01-09 LAB — CBC
HCT: 37.3 % (ref 36.0–46.0)
HEMOGLOBIN: 12.1 g/dL (ref 12.0–15.0)
MCH: 29.6 pg (ref 26.0–34.0)
MCHC: 32.4 g/dL (ref 30.0–36.0)
MCV: 91.2 fL (ref 78.0–100.0)
Platelets: 245 10*3/uL (ref 150–400)
RBC: 4.09 MIL/uL (ref 3.87–5.11)
RDW: 16.1 % — ABNORMAL HIGH (ref 11.5–15.5)
WBC: 12.7 10*3/uL — AB (ref 4.0–10.5)

## 2016-01-09 LAB — GASTROINTESTINAL PANEL BY PCR, STOOL (REPLACES STOOL CULTURE)
ASTROVIRUS: NOT DETECTED
Adenovirus F40/41: NOT DETECTED
CAMPYLOBACTER SPECIES: NOT DETECTED
Cryptosporidium: NOT DETECTED
Cyclospora cayetanensis: NOT DETECTED
ENTAMOEBA HISTOLYTICA: NOT DETECTED
ENTEROAGGREGATIVE E COLI (EAEC): NOT DETECTED
ENTEROPATHOGENIC E COLI (EPEC): NOT DETECTED
ENTEROTOXIGENIC E COLI (ETEC): NOT DETECTED
Giardia lamblia: NOT DETECTED
NOROVIRUS GI/GII: NOT DETECTED
Plesimonas shigelloides: NOT DETECTED
Rotavirus A: NOT DETECTED
SHIGA LIKE TOXIN PRODUCING E COLI (STEC): NOT DETECTED
Salmonella species: NOT DETECTED
Sapovirus (I, II, IV, and V): NOT DETECTED
Shigella/Enteroinvasive E coli (EIEC): NOT DETECTED
VIBRIO CHOLERAE: NOT DETECTED
Vibrio species: NOT DETECTED
Yersinia enterocolitica: NOT DETECTED

## 2016-01-09 LAB — ECHOCARDIOGRAM LIMITED
HEIGHTINCHES: 63 in
WEIGHTICAEL: 2328.06 [oz_av]

## 2016-01-09 LAB — PROTIME-INR
INR: 1.35
PROTHROMBIN TIME: 16.8 s — AB (ref 11.4–15.2)

## 2016-01-09 LAB — HEPARIN LEVEL (UNFRACTIONATED): Heparin Unfractionated: <0.1 IU/mL — ABNORMAL LOW (ref 0.30–0.70)

## 2016-01-09 LAB — CORTISOL: Cortisol, Plasma: 16.3 ug/dL

## 2016-01-09 LAB — TROPONIN I

## 2016-01-09 MED ORDER — HEPARIN (PORCINE) IN NACL 100-0.45 UNIT/ML-% IJ SOLN
1450.0000 [IU]/h | INTRAMUSCULAR | Status: DC
  Administered 2016-01-09: 1300 [IU]/h via INTRAVENOUS
  Filled 2016-01-09 (×3): qty 250

## 2016-01-09 MED ORDER — ONDANSETRON HCL 4 MG/2ML IJ SOLN
4.0000 mg | Freq: Once | INTRAMUSCULAR | Status: AC
  Administered 2016-01-09: 4 mg via INTRAVENOUS
  Filled 2016-01-09: qty 2

## 2016-01-09 MED ORDER — PROCHLORPERAZINE EDISYLATE 5 MG/ML IJ SOLN: 10.0000 mg | Freq: Four times a day (QID) | INTRAMUSCULAR | Status: DC | PRN

## 2016-01-09 MED ORDER — WARFARIN SODIUM 5 MG PO TABS
5.0000 mg | ORAL_TABLET | Freq: Once | ORAL | Status: AC
  Administered 2016-01-09: 5 mg via ORAL
  Filled 2016-01-09: qty 1

## 2016-01-09 MED ORDER — CHLORHEXIDINE GLUCONATE 0.12 % MT SOLN
15.0000 mL | Freq: Two times a day (BID) | OROMUCOSAL | Status: DC
  Administered 2016-01-09 – 2016-01-13 (×8): 15 mL via OROMUCOSAL
  Filled 2016-01-09 (×8): qty 15

## 2016-01-09 MED ORDER — METOPROLOL SUCCINATE ER 100 MG PO TB24
100.0000 mg | ORAL_TABLET | Freq: Two times a day (BID) | ORAL | Status: DC
  Administered 2016-01-09 – 2016-01-14 (×12): 100 mg via ORAL
  Filled 2016-01-09 (×11): qty 4
  Filled 2016-01-09: qty 1
  Filled 2016-01-09: qty 4

## 2016-01-09 MED ORDER — ORAL CARE MOUTH RINSE
15.0000 mL | Freq: Two times a day (BID) | OROMUCOSAL | Status: DC
  Administered 2016-01-10 – 2016-01-13 (×8): 15 mL via OROMUCOSAL

## 2016-01-09 MED ORDER — VERAPAMIL HCL 120 MG PO TABS
120.0000 mg | ORAL_TABLET | Freq: Two times a day (BID) | ORAL | Status: DC
  Administered 2016-01-09 – 2016-01-16 (×15): 120 mg via ORAL
  Filled 2016-01-09 (×17): qty 1

## 2016-01-09 MED ORDER — MORPHINE SULFATE (PF) 2 MG/ML IV SOLN
2.0000 mg | Freq: Once | INTRAVENOUS | Status: AC
Start: 2016-01-09 — End: 2016-01-09
  Administered 2016-01-09: 2 mg via INTRAVENOUS
  Filled 2016-01-09: qty 1

## 2016-01-09 NOTE — Progress Notes (Signed)
  Echocardiogram 2D Echocardiogram Limited has been performed.  [name] M 01/09/2016, 8:02 AM

## 2016-01-09 NOTE — Progress Notes (Signed)
PROGRESS NOTE    [name]  [id number] DOB: [birth year] DOA: 01/08/2016 PCP: Leeroy Cha, MD   Brief Narrative:  [name] is a 75 y.o. 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 Patient was discharged home and did well the first few days but then states lost her appetite and started coughing and had worsening diarrhea over the course of the week. Patient states she became more fatigued and developed fevers and did not want to come back to the ER at the urge of her daughter. She Admitted to Nausea but no vomiting and states she has chest wall pain and tenderness from coughing. Patient thinks liquidly diarrhea is getting worse and has had countless episodes to the point where she defecates on herself. No blood in stool noted. Has had fevers and chills at home and has worsened since discharge last Sunday. Per daughter has been so weak and has only lying in bed and not been eating. Saw her Cardiologist 4 days ago and states that when she started feeling worse. States feels very similar to last hospitalization. No other concerns or complaints at this time. Hospitalist was called to Admit because of Sepsis. Feeling slightly better still nauesous. Had Fever this Am. No other concerns.   Assessment & Plan:   Active Problems:   Atrial fibrillation (HCC)   Hypertension   Hyperlipidemia   Chest pain   QT prolongation   Rheumatoid arthritis (HCC)   Pleuritic chest pain   Persistent atrial fibrillation (HCC)   Pulmonary hypertension   Sepsis (HCC)   Diarrhea   Chronic systolic CHF (congestive heart failure) ([place])   Immunosuppressed status ([place])  Sepsis of Unclear Etiology possible from Health  Care Acquired Pneumonia vs. Gastrointestinal Illness r/o C Diff -Patient is Septic on Admission with Fever of 102, Tachycardia, Tachypnea, Leukocytosis of 18.8 and Lactic Acid Level of 4.83. Etiology is not clear at this point and CT of Chest/Abd/Pelvis Pending.  -Possibly from Pneumonia vs. C. Difficile Colitis? -Sepsis Bundle Protocol -Check Procalcitonin and Repeat Lactic Acid Level per Protocol - Repeat Lactic Acid was 1.48 -Check C. Difficile PCR Toxin and Antigen; Pending -Blood Cx's x2, Sputum Cx; Urinalysis Negative  -Urine Cx Pending -Normal Saline (30 mL/kg) initial Bolus and then 100 mL/hr -Chest and Abdominal Imaging X-Ray showed Cardiomegaly with possible mild perihilar edema. Small bilateral pleural effusions.Mild left lower lobe opacity, likely atelectasis. No evidence of small bowel obstruction or free air. -Repeat CXR in AM -C/w IV Vancomycin/IV Cefepime/ and po Metronidazole -Follow blood culture, urine culture and sputum Cx -CT of Chest/Abd/Pelvis Not reveling infection -?Embolic Fever; May consult Infectious Diseases  Diarrhea r/o C. 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  -Stomach/Bowel: Normal remainder of the stomach. Colonic stool burden suggests constipation. Normal small bowel. -C Diff PCR ordered;  -GI Stool Panel Negative -C/w Home Metronidazole 500 mg po q8h and Acidophilus  Cough with Non-Productive Sputum r/o HCAP -CT of Chest Showed Multifactorial Degradation and Small Bilateral Pleural Effusions with Bibasilar Atelectasis -C/w Xopenex q6hprn -IV Vanc and Cefepime per Pharmacy Dosing  Visual Hallucinations -Head CT Negative and showed No acute intracranial abnormality. Stable mild brain parenchymal atrophy and chronic microvascular disease. -Avoid Sedatives  Mild Hyponatremia -Patients  Na+ was 135 -Repeat CMP in AM  Atypical CP r/o ACS -Troponin I x 3 Negative -Repeat EKG  showed qTC of 510; No ST Elevation or Depression but did show Atrial Fibrillation with RVR  Prolonged qTC -EKG this Admission was 550 -Check EKG in AM -Avoid Zofran, Phenergan, Ranitidine; Started Compazine -Monitor SDU with Telemetry  Generalized Weakness and Deconditioning -PT/OT Evaluation -C/w IVF  -Nutritionist Consult  Pericardial Effusion:  -S/p of pericardiocentesis and Pericardial drain placement with removal of 500 mL of Dark Sanguinous Fluid during the Admission of 11/29-12/3. -She underwent repeatedechocardiograms without significant fluid accumulation before discharge. Had Echo Done prior to Last Discharge. -Did not want Colchicine for Pericarditis colchicine -CT of the Chest showed Cardiomegaly with slight increase in small pericardial effusion. -ECHOCardiogram did not show Pericardial Effusion.  Atrial Fibrillation with RVR:  -Recently Saw Dr. Irish Lack on 01/04/16 -CHA2DS2-VASc Scoreis 3 - Was on Oral Anticoagulation with Coumadin *currently held due to Bloody Pericardial Effusion -Coumadin was held because of Bloody Pericardial Effusion; Per notes was to be started During the First Week of January; However given CT ABD/PELVIS FINDINGS RESTARTED HEPARIN gtt and Warfarain -Restarted Metoprolol and Verapamil  -C/w Telemetry Monitoring -ECHO as below -May benefit from Cardiology Consultation and TEE; Likely will Consult in Am  Rheumatoid Arthritis:  -On prednisone 2.5 mg daily and continued,  -Hold Home Methotrexate and Etenercept. Clinically stable. -Check Cortisol level -May Need Stress Dose Steroids  HLD: -Last LDL was79 on 05/21/13 -Continue home medications:Pravastatin   GERD: -Avoid PPI and H2 Blocker in the Setting of Possible C.Diff and qTC prolongation  HTN: -Hold Lasix -Restart Metoprolol and Verapamil -Continue to Monitor Closely  Chronic systolic congestive heart failure: -Transthoracic ECHO on 12/29/15 showed EF of 50-55 %. However  previous [facility] showed EF of 40-45%.  -BNP was 302.0 -Hold Lasix due to sepsis  DVT prophylaxis: Heparin gtt and Warfarin Code Status: FULL CODE Family Communication: No Family present at bedside Disposition Plan: Remain in SDU  Consultants:   None  Procedures: ECHOcardiogram  Antimicrobials: IV Cefepime, po Metronidazole, and IV Vancomycin  Subjective: Seen and examined at bedside and stated she did not have more bowel movements. Still Nauseous. Discussed Echo Findings with her and will likely get a Cardiology Consultation. Feels slightly better.   Objective: Vitals:   01/09/16 1600 01/09/16 1700 01/09/16 1800 01/09/16 1815  BP: 90/67   97/66  Pulse: 62 72 68 (!) 101  Resp: (!) 32 (!) 23 16 (!) 30  Temp:      TempSrc:      SpO2: 96% 97% 95% 99%  Weight:      Height:        Intake/Output Summary (Last 24 hours) at 01/09/16 1833 Last data filed at 01/09/16 1800  Gross per 24 hour  Intake          1905.13 ml  Output                0 ml  Net          1905.13 ml   Filed Weights   01/08/16 1800  Weight: 66 kg (145 lb 8.1 oz)    Examination: Physical Exam:  Constitutional: Caucasian female in no acute distress  Eyes: Lids and conjunctivae normal, sclerae anicteric  ENMT: External Ears, Nose appear normal. Grossly normal hearing.  Neck: Appears normal, supple, no cervical masses, normal ROM, no appreciable thyromegaly, no JVD.  Respiratory: Diminished to auscultation bilaterally, No wheezing, rales, rhonchi. Mild crackles. Slightly increased  respiratory effort and patient is Slightly tachypenic. No accessory muscle use.  Cardiovascular: Irregularly Irregular and tachycardic, no murmurs / rubs / gallops. S1 and S2 auscultated. No extremity edema. Abdomen: Soft, Nontender on palpation, non-distended. No masses palpated. No appreciable hepatosplenomegaly. Bowel sounds positive x4.  GU: Deferred. Musculoskeletal: No clubbing / cyanosis of digits/nails. No joint deformity  upper and lower extremities.  Skin: No rashes, lesions, ulcers on limited skin evaluation. No induration; Warm and dry. Mild Right Arm Swelling from IV infiltration.  Neurologic: CN 2-12 grossly intact with no focal deficits. Sensation intact in all 4 Extremities. Romberg sign cerebellar reflexes not assessed.  Psychiatric: Normal judgment and insight. Alert and oriented x 3. Depressed mood and appropriate affect  Data Reviewed: I have personally reviewed following labs and imaging studies  CBC:  Recent Labs Lab 01/08/16 1257 01/08/16 1848 01/09/16 0609  WBC 18.8* 16.2* 12.7*  NEUTROABS  --  14.6*  --   HGB 13.7 11.9* 12.1  HCT 42.2 36.6 37.3  MCV 91.5 91.3 91.2  PLT 302 263 [id number]   Basic Metabolic Panel:  Recent Labs Lab 01/08/16 1257 01/08/16 1848 01/09/16 0609  NA 134* 136 135  K 3.8 3.9 3.9  CL 98* 105 107  CO2 24 24 21*  GLUCOSE 141* 112* 112*  BUN 11 8 6   CREATININE 0.81 0.65 0.59  CALCIUM 8.9 7.6* 8.1*   GFR: Estimated Creatinine Clearance: 56.3 mL/min (by C-G formula based on SCr of 0.59 mg/dL). Liver Function Tests:  Recent Labs Lab 01/08/16 1257 01/08/16 1848 01/09/16 0609  AST 29 19 42*  ALT 25 17 28   ALKPHOS 62 50 55  BILITOT 0.9 0.9 1.0  PROT 7.3 5.8* 6.0*  ALBUMIN 3.7 2.9* 3.0*   No results for input(s): LIPASE, AMYLASE in the last 168 hours. No results for input(s): AMMONIA in the last 168 hours. Coagulation Profile:  Recent Labs Lab 01/08/16 1257 01/08/16 1848 01/09/16 0609  INR 1.27 1.39 1.35   Cardiac Enzymes:  Recent Labs Lab 01/08/16 1848 01/08/16 2304 01/09/16 0609  TROPONINI <0.03 <0.03 <0.03   BNP (last 3 results)  Recent Labs  12/13/15 1704  PROBNP 215.0*   HbA1C: No results for input(s): HGBA1C in the last 72 hours. CBG:  Recent Labs Lab 01/08/16 1513  GLUCAP 192*   Lipid Profile: No results for input(s): CHOL, HDL, LDLCALC, TRIG, CHOLHDL, LDLDIRECT in the last 72 hours. Thyroid Function Tests:  Recent  Labs  01/08/16 1848  TSH 1.999   Anemia Panel: No results for input(s): VITAMINB12, FOLATE, FERRITIN, TIBC, IRON, RETICCTPCT in the last 72 hours. Sepsis Labs:  Recent Labs Lab 01/08/16 1349 01/08/16 1628 01/08/16 1833 01/08/16 1848  PROCALCITON  --   --   --  0.12  LATICACIDVEN 4.83* 1.48 1.3  --     Recent Results (from the past 240 hour(s))  Blood culture (routine x 2)     Status: None (Preliminary result)   Collection Time: 01/08/16  2:14 PM  Result Value Ref Range Status   Specimen Description BLOOD LEFT FOREARM  Final   Special Requests IN PEDIATRIC BOTTLE 5CC  Final   Culture   Final    NO GROWTH < 24 HOURS Performed at [facility]    Report Status PENDING  Incomplete  Blood culture (routine x 2)     Status: None (Preliminary result)   Collection Time: 01/08/16  3:15 PM  Result Value Ref Range Status   Specimen Description BLOOD RIGHT ANTECUBITAL  Final  Special Requests BOTTLES DRAWN AEROBIC AND ANAEROBIC 5CC  Final   Culture   Final    NO GROWTH < 12 HOURS Performed at [facility]    Report Status PENDING  Incomplete  Gastrointestinal Panel by PCR , Stool     Status: None   Collection Time: 01/09/16  3:02 PM  Result Value Ref Range Status   Campylobacter species NOT DETECTED NOT DETECTED Final   Plesimonas shigelloides NOT DETECTED NOT DETECTED Final   Salmonella species NOT DETECTED NOT DETECTED Final   Yersinia enterocolitica NOT DETECTED NOT DETECTED Final   Vibrio species NOT DETECTED NOT DETECTED Final   Vibrio cholerae NOT DETECTED NOT DETECTED Final   Enteroaggregative E coli (EAEC) NOT DETECTED NOT DETECTED Final   Enteropathogenic E coli (EPEC) NOT DETECTED NOT DETECTED Final   Enterotoxigenic E coli (ETEC) NOT DETECTED NOT DETECTED Final   Shiga like toxin producing E coli (STEC) NOT DETECTED NOT DETECTED Final   Shigella/Enteroinvasive E coli (EIEC) NOT DETECTED NOT DETECTED Final   Cryptosporidium NOT DETECTED NOT DETECTED  Final   Cyclospora cayetanensis NOT DETECTED NOT DETECTED Final   Entamoeba histolytica NOT DETECTED NOT DETECTED Final   Giardia lamblia NOT DETECTED NOT DETECTED Final   Adenovirus F40/41 NOT DETECTED NOT DETECTED Final   Astrovirus NOT DETECTED NOT DETECTED Final   Norovirus GI/GII NOT DETECTED NOT DETECTED Final   Rotavirus A NOT DETECTED NOT DETECTED Final   Sapovirus (I, II, IV, and V) NOT DETECTED NOT DETECTED Final    Radiology Studies: Ct Head Wo Contrast  Result Date: 01/08/2016 CLINICAL DATA:  Fever with altered mental status. EXAM: CT HEAD WITHOUT CONTRAST TECHNIQUE: Contiguous axial images were obtained from the base of the skull through the vertex without intravenous contrast. COMPARISON:  12/28/2015 FINDINGS: Brain: No evidence of acute infarction, hemorrhage, hydrocephalus, extra-axial collection or mass lesion/mass effect. Stable mild brain parenchymal atrophy and microangiopathic changes in the deep white matter. Vascular: No hyperdense vessel or unexpected calcification. Skull: Normal. Negative for fracture or focal lesion. Sinuses/Orbits: No acute finding. Other: None. IMPRESSION: No acute intracranial abnormality. Stable mild brain parenchymal atrophy and chronic microvascular disease. Electronically Signed   By: Fidela Salisbury M.D.   On: 01/08/2016 17:33   Ct Chest W Contrast  Result Date: 01/08/2016 CLINICAL DATA:  Altered mental status. Fever. Cough. Abdominal pain. Diarrhea. Atrial fibrillation on Coumadin. Hypertension. Possible pneumonia or colitis. EXAM: CT CHEST, ABDOMEN, AND PELVIS WITH CONTRAST TECHNIQUE: Multidetector CT imaging of the chest, abdomen and pelvis was performed following the standard protocol during bolus administration of intravenous contrast. CONTRAST:  124mL ISOVUE-300 IOPAMIDOL (ISOVUE-300) INJECTION 61% COMPARISON:  Plain films 01/08/2016. Chest CT 12/15/2015. Abdominopelvic CT 12/29/2015. FINDINGS: CT CHEST FINDINGS Cardiovascular: Aortic  and branch vessel atherosclerosis. Tortuous thoracic aorta. Moderate cardiomegaly. Small pericardial effusion is similar to slightly increased since 12/29/2015. No central pulmonary embolism, on this non-dedicated study. Mediastinum/Nodes: Enlargement of a node within the azygoesophageal recess at 1.4 cm on image 26/series 2. Favored to be reactive, given development over the past 3 weeks. No hilar adenopathy. Tiny hiatal hernia. Lungs/Pleura: Small, right greater than left pleural effusions. Mild to moderate degradation secondary to motion and patient arm position, not raised above the head. Bibasilar atelectasis. Musculoskeletal: Lower thoracic hypoattenuating lesions are likely hemangiomas. CT ABDOMEN PELVIS FINDINGS Hepatobiliary: Hepatomegaly at 22.6 cm. Small gallstones without acute cholecystitis or biliary duct dilatation. Pancreas: Normal, without mass or ductal dilatation. Spleen: Splenic hypoattenuation is wedge-shaped, primarily superiorly on image 49/series 2.  New since the prior. Adrenals/Urinary Tract: Normal adrenal glands. Normal left kidney. New hypoattenuation involving the lower pole right kidney is suspicious for infarct, including on image 75/ series 2. No hydronephrosis. Normal urinary bladder. Stomach/Bowel: Normal remainder of the stomach. Colonic stool burden suggests constipation. Normal small bowel. Vascular/Lymphatic: Aortic and branch vessel atherosclerosis. No abdominopelvic adenopathy. Reproductive: Hysterectomy.  No adnexal mass. Other: No significant free fluid. Musculoskeletal: Lumbosacral spondylosis with degenerative disc disease at the lumbosacral junction. IMPRESSION: 1. multifactorial degradation, especially involving the chest. 2. 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 Suspect a central embolic source. Consider echocardiography. 3. Cholelithiasis. 4. Small bilateral pleural effusions with bibasilar atelectasis. 5. Cardiomegaly with slight increase in small  pericardial effusion. 6.  Aortic atherosclerosis. These results will be called to the ordering clinician or representative by the Radiologist Assistant, and communication documented in the PACS or zVision Dashboard. Electronically Signed   By: Abigail Miyamoto M.D.   On: 01/08/2016 17:54   Ct Abdomen Pelvis W Contrast  Result Date: 01/08/2016 CLINICAL DATA:  Altered mental status. Fever. Cough. Abdominal pain. Diarrhea. Atrial fibrillation on Coumadin. Hypertension. Possible pneumonia or colitis. EXAM: CT CHEST, ABDOMEN, AND PELVIS WITH CONTRAST TECHNIQUE: Multidetector CT imaging of the chest, abdomen and pelvis was performed following the standard protocol during bolus administration of intravenous contrast. CONTRAST:  120mL ISOVUE-300 IOPAMIDOL (ISOVUE-300) INJECTION 61% COMPARISON:  Plain films 01/08/2016. Chest CT 12/15/2015. Abdominopelvic CT 12/29/2015. FINDINGS: CT CHEST FINDINGS Cardiovascular: Aortic and branch vessel atherosclerosis. Tortuous thoracic aorta. Moderate cardiomegaly. Small pericardial effusion is similar to slightly increased since 12/29/2015. No central pulmonary embolism, on this non-dedicated study. Mediastinum/Nodes: Enlargement of a node within the azygoesophageal recess at 1.4 cm on image 26/series 2. Favored to be reactive, given development over the past 3 weeks. No hilar adenopathy. Tiny hiatal hernia. Lungs/Pleura: Small, right greater than left pleural effusions. Mild to moderate degradation secondary to motion and patient arm position, not raised above the head. Bibasilar atelectasis. Musculoskeletal: Lower thoracic hypoattenuating lesions are likely hemangiomas. CT ABDOMEN PELVIS FINDINGS Hepatobiliary: Hepatomegaly at 22.6 cm. Small gallstones without acute cholecystitis or biliary duct dilatation. Pancreas: Normal, without mass or ductal dilatation. Spleen: Splenic hypoattenuation is wedge-shaped, primarily superiorly on image 49/series 2. New since the prior.  Adrenals/Urinary Tract: Normal adrenal glands. Normal left kidney. New hypoattenuation involving the lower pole right kidney is suspicious for infarct, including on image 75/ series 2. No hydronephrosis. Normal urinary bladder. Stomach/Bowel: Normal remainder of the stomach. Colonic stool burden suggests constipation. Normal small bowel. Vascular/Lymphatic: Aortic and branch vessel atherosclerosis. No abdominopelvic adenopathy. Reproductive: Hysterectomy.  No adnexal mass. Other: No significant free fluid. Musculoskeletal: Lumbosacral spondylosis with degenerative disc disease at the lumbosacral junction. IMPRESSION: 1. multifactorial degradation, especially involving the chest. 2. 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 Suspect a central embolic source. Consider echocardiography. 3. Cholelithiasis. 4. Small bilateral pleural effusions with bibasilar atelectasis. 5. Cardiomegaly with slight increase in small pericardial effusion. 6.  Aortic atherosclerosis. These results will be called to the ordering clinician or representative by the Radiologist Assistant, and communication documented in the PACS or zVision Dashboard. Electronically Signed   By: Abigail Miyamoto M.D.   On: 01/08/2016 17:54   Dg Chest Port 1 View  Result Date: 01/09/2016 CLINICAL DATA:  75 year old female with sepsis. EXAM: PORTABLE CHEST 1 VIEW COMPARISON:  Chest radiograph dated 01/08/2016 and CT dated 01/08/2016 FINDINGS: There are small bilateral pleural effusions, left greater right and bibasilar hazy densities compatible with atelectasis versus infiltrate.  There is moderate cardiomegaly. No pneumothorax. Osteopenia with degenerative changes of the spine. No acute osseous pathology. IMPRESSION: Small bilateral pleural effusions and bibasilar atelectasis versus infiltrate. Moderate cardiomegaly. Electronically Signed   By: Anner Crete M.D.   On: 01/09/2016 06:24   Dg Abd Acute W/chest  Result Date: 01/08/2016 CLINICAL  DATA:  Abdominal pain, weakness, nausea/vomiting EXAM: DG ABDOMEN ACUTE W/ 1V CHEST COMPARISON:  CT abdomen/pelvis dated 12/29/2015 FINDINGS: Small bilateral pleural effusions. Possible mild perihilar edema. Mild left lower lobe opacity, likely atelectasis. Cardiomegaly. Nonobstructive bowel gas pattern. No evidence of free air on the lateral decubitus view. Mild degenerative changes the visualized thoracolumbar spine. IMPRESSION: Cardiomegaly with possible mild perihilar edema. Small bilateral pleural effusions. Mild left lower lobe opacity, likely atelectasis. No evidence of small bowel obstruction or free air. Electronically Signed   By: Julian Hy M.D.   On: 01/08/2016 13:48   ECHOCARDIOGRAM Study Conclusions  - Left ventricle: The cavity size was normal. There was mild   concentric hypertrophy. Systolic function was normal. The   estimated ejection fraction was in the range of 50% to 55%. Wall   motion was normal; there were no regional wall motion   abnormalities. The study was not technically sufficient to allow   evaluation of LV diastolic dysfunction due to atrial   fibrillation. - Aortic valve: Trileaflet; normal thickness leaflets. There was no   regurgitation. - Aortic root: The aortic root was normal in size. - Ascending aorta: The ascending aorta was normal in size. - Mitral valve: Structurally normal valve. There was mild   regurgitation. - Left atrium: The atrium was mildly dilated. - Right ventricle: The cavity size was normal. Wall thickness was   normal. Systolic function was normal. - Right atrium: The atrium was normal in size. - Tricuspid valve: There was mild regurgitation. - Pulmonary arteries: Systolic pressure was mildly increased. PA   peak pressure: 40 mm Hg (S). - Inferior vena cava: The vessel was normal in size. - Pericardium, extracardiac: There was no pericardial effusion.  Scheduled Meds: . acidophilus  1 capsule Oral Daily  . ceFEPime  (MAXIPIME) IV  1 g Intravenous Q8H  . chlorhexidine  15 mL Mouth Rinse BID  . cholecalciferol  2,000 Units Oral Daily  . folic acid  2 mg Oral Daily  . mouth rinse  15 mL Mouth Rinse q12n4p  . metoprolol succinate  100 mg Oral BID  . metroNIDAZOLE  500 mg Oral TID  . multivitamin with minerals  1 tablet Oral Daily  . pravastatin  20 mg Oral QHS  . predniSONE  2.5 mg Oral Q breakfast  . vancomycin  750 mg Intravenous Q12H  . verapamil  120 mg Oral Q12H  . Warfarin - Pharmacist Dosing Inpatient   Does not apply q1800   Continuous Infusions: . sodium chloride 100 mL/hr at 01/09/16 1800  . heparin 1,300 Units/hr (01/09/16 1803)     LOS: 1 day   Kerney Elbe, DO [facility] Pager [phone]  If 7PM-7AM, please contact night-coverage [email or web address] Password [facility] 01/09/2016, 6:33 PM

## 2016-01-09 NOTE — Progress Notes (Signed)
ANTICOAGULATION CONSULT NOTE - Follow Up Consult  Pharmacy Consult for heparin and warfarin Indication: hx atrial fibrillation; new renal and splenic infarcts  Allergies  Allergen Reactions  . Arthrotec [Diclofenac-Misoprostol] Diarrhea  . Erythromycin Anaphylaxis       . Morphine Sulfate Other (See Comments)    Reaction:  Hallucinations   . Amoxicillin Rash and Other (See Comments)    Has patient had a PCN reaction causing immediate rash, facial/tongue/throat swelling, SOB or lightheadedness with hypotension: Yes Has patient had a PCN reaction causing severe rash involving mucus membranes or skin necrosis: Yes Has patient had a PCN reaction that required hospitalization No Has patient had a PCN reaction occurring within the last 10 years: No If all of the above answers are "NO", then may proceed with Cephalosporin use.  . Diltiazem Hcl Swelling, Rash and Other (See Comments)    Pt is able to tolerate Verapamil.    . Gold-Containing Drug Products Rash  . Macrodantin [Nitrofurantoin Macrocrystal] Rash  . Naproxen Rash  . Penicillins Rash and Other (See Comments)    ++ tolerates cefepime and ceftriaxone ++ Has patient had a PCN reaction causing immediate rash, facial/tongue/throat swelling, SOB or lightheadedness with hypotension: Yes Has patient had a PCN reaction causing severe rash involving mucus membranes or skin necrosis: Yes Has patient had a PCN reaction that required hospitalization No Has patient had a PCN reaction occurring within the last 10 years: No If all of the above answers are "NO", then may proceed with Cephalosporin use.     Patient Measurements: Height: 5\' 3"  (160 cm) Weight: 145 lb 8.1 oz (66 kg) IBW/kg (Calculated) : 52.4 Heparin Dosing Weight: 66 kg  Vital Signs: Temp: 97.7 F (36.5 C) (12/24 1200) Temp Source: Oral (12/24 1200) BP: 90/67 (12/24 1600) Pulse Rate: 72 (12/24 1700)  Labs:  Recent Labs  01/08/16 1257 01/08/16 1848 01/08/16 2304  01/09/16 0609 01/09/16 1626  HGB 13.7 11.9*  --  12.1  --   HCT 42.2 36.6  --  37.3  --   PLT 302 263  --  245  --   APTT  --  34  --   --   --   LABPROT 16.0* 17.2*  --  16.8*  --   INR 1.27 1.39  --  1.35  --   HEPARINUNFRC  --   --   --  <0.10* <0.10*  CREATININE 0.81 0.65  --  0.59  --   TROPONINI  --  <0.03 <0.03 <0.03  --     Estimated Creatinine Clearance: 56.3 mL/min (by C-G formula based on SCr of 0.59 mg/dL).   Medications:  Last warfarin regimen (per [facility] clinic note on 11/10): 5 mg daily except 2.5 mg on Wednesday   Assessment: Patient is a 75 y.o F with hx pericardial effusion in in November 2017 (s/p Pericardiocentesis and pericardial drain) and afib on warfarin PTA.  Outpatient cardiology note on 12/19 indicated that warfarin was placed on hold with plan to resume "during the first week of January" d/t bloody pericardial effusion.  She presented to the ED in 12/23 with c/o fatigue, hallucination and nausea.  Abd CT on 12/23 showed new renal splenic and right renal infarcts.  Heparin started and warfarin resumed on 12/23 for afib and infarcts.  Today, 01/09/2016: - first heparin level now back undetectable (per RN, no issue with IV line) - INR is subtherapeutic at 1.35 after one dose of 7.5 mg yesterday - cbc remains stable -  no active bleeding documented - drug-drug intxns: flagyl, cefepime/vancomycin can increase INR -1626 HL=<0.10 subtherapeutic x2, no infusion or bleeding issues per RN  Goal of Therapy:  INR 2-3 Heparin level 0.3-0.7 units/ml Monitor platelets by anticoagulation protocol: Yes   Plan:  - Increase heparin drip to 1300 units/hr (will not bolus d/t bleeding risk) - Recheck HL in 8 hours - monitor for s/s of bleeding  [name] 01/09/2016,6:02 PM

## 2016-01-09 NOTE — Progress Notes (Signed)
ANTICOAGULATION CONSULT NOTE - Follow Up Consult  Pharmacy Consult for heparin and warfarin Indication: hx atrial fibrillation; new renal and splenic infarcts  Allergies  Allergen Reactions  . Arthrotec [Diclofenac-Misoprostol] Diarrhea  . Erythromycin Anaphylaxis       . Morphine Sulfate Other (See Comments)    Reaction:  Hallucinations   . Amoxicillin Rash and Other (See Comments)    Has patient had a PCN reaction causing immediate rash, facial/tongue/throat swelling, SOB or lightheadedness with hypotension: Yes Has patient had a PCN reaction causing severe rash involving mucus membranes or skin necrosis: Yes Has patient had a PCN reaction that required hospitalization No Has patient had a PCN reaction occurring within the last 10 years: No If all of the above answers are "NO", then may proceed with Cephalosporin use.  . Diltiazem Hcl Swelling, Rash and Other (See Comments)    Pt is able to tolerate Verapamil.    . Gold-Containing Drug Products Rash  . Macrodantin [Nitrofurantoin Macrocrystal] Rash  . Naproxen Rash  . Penicillins Rash and Other (See Comments)    ++ tolerates cefepime and ceftriaxone ++ Has patient had a PCN reaction causing immediate rash, facial/tongue/throat swelling, SOB or lightheadedness with hypotension: Yes Has patient had a PCN reaction causing severe rash involving mucus membranes or skin necrosis: Yes Has patient had a PCN reaction that required hospitalization No Has patient had a PCN reaction occurring within the last 10 years: No If all of the above answers are "NO", then may proceed with Cephalosporin use.     Patient Measurements: Height: 5\' 3"  (160 cm) Weight: 145 lb 8.1 oz (66 kg) IBW/kg (Calculated) : 52.4 Heparin Dosing Weight: 66 kg  Vital Signs: Temp: 102.2 F (39 C) (12/24 0400) Temp Source: Oral (12/24 0400) BP: 136/64 (12/24 0400) Pulse Rate: 133 (12/24 0500)  Labs:  Recent Labs  01/08/16 1257 01/08/16 1848 01/08/16 2304  01/09/16 0609  HGB 13.7 11.9*  --  12.1  HCT 42.2 36.6  --  37.3  PLT 302 263  --  245  APTT  --  34  --   --   LABPROT 16.0* 17.2*  --   --   INR 1.27 1.39  --   --   HEPARINUNFRC  --   --   --  <0.10*  CREATININE 0.81 0.65  --  0.59  TROPONINI  --  <0.03 <0.03 <0.03    Estimated Creatinine Clearance: 56.3 mL/min (by C-G formula based on SCr of 0.59 mg/dL).   Medications:  Last warfarin regimen (per [facility] clinic note on 11/10): 5 mg daily except 2.5 mg on Wednesday   Assessment: Patient is a 75 y.o F with hx pericardial effusion in in November 2017 (s/p Pericardiocentesis and pericardial drain) and afib on warfarin PTA.  Outpatient cardiology note on 12/19 indicated that warfarin was placed on hold with plan to resume "during the first week of January" d/t bloody pericardial effusion.  She presented to the ED in 12/23 with c/o fatigue, hallucination and nausea.  Abd CT on 12/23 showed new renal splenic and right renal infarcts.  Heparin started and warfarin resumed on 12/23 for afib and infarcts.  Today, 01/09/2016: - first heparin level now back undetectable (per RN, no issue with IV line) - INR is subtherapeutic at 1.35 after one dose of 7.5 mg yesterday - cbc remains stable - no active bleeding documented - drug-drug intxns: flagyl, cefepime/vancomycin can increase INR  Goal of Therapy:  INR 2-3 Heparin level 0.3-0.7 units/ml  Monitor platelets by anticoagulation protocol: Yes   Plan:  - Increase heparin drip to 1000 units/hr (will not bolus d/t bleeding risk) - check 8 hr heparin level - warfarin 5 mg PO x1 today (will be conservative with dose since patient is on flagyl) - monitor for s/s of bleeding  Maximus Hoffert P 01/09/2016,7:27 AM

## 2016-01-10 ENCOUNTER — Inpatient Hospital Stay (HOSPITAL_COMMUNITY): Payer: Medicare Other

## 2016-01-10 ENCOUNTER — Encounter (HOSPITAL_COMMUNITY): Payer: Self-pay | Admitting: Cardiology

## 2016-01-10 DIAGNOSIS — I482 Chronic atrial fibrillation: Secondary | ICD-10-CM

## 2016-01-10 LAB — COMPREHENSIVE METABOLIC PANEL
ALBUMIN: 2.8 g/dL — AB (ref 3.5–5.0)
ALK PHOS: 53 U/L (ref 38–126)
ALT: 24 U/L (ref 14–54)
AST: 28 U/L (ref 15–41)
Anion gap: 6 (ref 5–15)
BUN: 10 mg/dL (ref 6–20)
CO2: 23 mmol/L (ref 22–32)
Calcium: 8.3 mg/dL — ABNORMAL LOW (ref 8.9–10.3)
Chloride: 104 mmol/L (ref 101–111)
Creatinine, Ser: 0.79 mg/dL (ref 0.44–1.00)
GFR calc Af Amer: >60 mL/min (ref >60)
GFR calc non Af Amer: >60 mL/min (ref >60)
GLUCOSE: 136 mg/dL — AB (ref 65–99)
POTASSIUM: 4 mmol/L (ref 3.5–5.1)
Sodium: 133 mmol/L — ABNORMAL LOW (ref 135–145)
TOTAL PROTEIN: 5.7 g/dL — AB (ref 6.5–8.1)
Total Bilirubin: 0.5 mg/dL (ref 0.3–1.2)

## 2016-01-10 LAB — CBC WITH DIFFERENTIAL/PLATELET
BASOS ABS: 0 10*3/uL (ref 0.0–0.1)
BASOS PCT: 0 %
Eosinophils Absolute: 0 10*3/uL (ref 0.0–0.7)
Eosinophils Relative: 0 %
HEMATOCRIT: 31.6 % — AB (ref 36.0–46.0)
HEMOGLOBIN: 10.3 g/dL — AB (ref 12.0–15.0)
Lymphocytes Relative: 7 %
Lymphs Abs: 0.9 10*3/uL (ref 0.7–4.0)
MCH: 29.8 pg (ref 26.0–34.0)
MCHC: 32.6 g/dL (ref 30.0–36.0)
MCV: 91.3 fL (ref 78.0–100.0)
Monocytes Absolute: 0.6 10*3/uL (ref 0.1–1.0)
Monocytes Relative: 5 %
NEUTROS ABS: 10.7 10*3/uL — AB (ref 1.7–7.7)
NEUTROS PCT: 88 %
Platelets: 234 10*3/uL (ref 150–400)
RBC: 3.46 MIL/uL — ABNORMAL LOW (ref 3.87–5.11)
RDW: 16.1 % — AB (ref 11.5–15.5)
WBC: 12.2 10*3/uL — ABNORMAL HIGH (ref 4.0–10.5)

## 2016-01-10 LAB — PHOSPHORUS: Phosphorus: 2.7 mg/dL (ref 2.5–4.6)

## 2016-01-10 LAB — URINE CULTURE: Culture: NO GROWTH

## 2016-01-10 LAB — HEPARIN LEVEL (UNFRACTIONATED)
HEPARIN UNFRACTIONATED: 0.21 [IU]/mL — AB (ref 0.30–0.70)
HEPARIN UNFRACTIONATED: 0.23 [IU]/mL — AB (ref 0.30–0.70)
HEPARIN UNFRACTIONATED: 0.24 [IU]/mL — AB (ref 0.30–0.70)

## 2016-01-10 LAB — MAGNESIUM: Magnesium: 1.8 mg/dL (ref 1.7–2.4)

## 2016-01-10 LAB — PROTIME-INR
INR: 1.99
Prothrombin Time: 22.9 seconds — ABNORMAL HIGH (ref 11.4–15.2)

## 2016-01-10 MED ORDER — SODIUM CHLORIDE 0.9 % IV BOLUS (SEPSIS)
500.0000 mL | Freq: Once | INTRAVENOUS | Status: AC
  Administered 2016-01-10: 500 mL via INTRAVENOUS

## 2016-01-10 MED ORDER — HEPARIN (PORCINE) IN NACL 100-0.45 UNIT/ML-% IJ SOLN
1500.0000 [IU]/h | INTRAMUSCULAR | Status: DC
  Administered 2016-01-10 – 2016-01-11 (×2): 1500 [IU]/h via INTRAVENOUS
  Filled 2016-01-10 (×4): qty 250

## 2016-01-10 MED ORDER — LEVALBUTEROL HCL 0.63 MG/3ML IN NEBU
0.6300 mg | INHALATION_SOLUTION | Freq: Four times a day (QID) | RESPIRATORY_TRACT | Status: DC
  Administered 2016-01-10 – 2016-01-12 (×11): 0.63 mg via RESPIRATORY_TRACT
  Filled 2016-01-10 (×11): qty 3

## 2016-01-10 MED ORDER — FUROSEMIDE 10 MG/ML IJ SOLN
20.0000 mg | Freq: Once | INTRAMUSCULAR | Status: AC
  Administered 2016-01-10: 20 mg via INTRAVENOUS
  Filled 2016-01-10: qty 2

## 2016-01-10 MED ORDER — ALUM & MAG HYDROXIDE-SIMETH 200-200-20 MG/5ML PO SUSP
30.0000 mL | ORAL | Status: DC | PRN
  Administered 2016-01-10: 30 mL via ORAL
  Filled 2016-01-10: qty 30

## 2016-01-10 NOTE — Consult Note (Signed)
Reason for Consult: a fib RVR Referring Physician: Dr. Alfredia Ferguson  PCP:  Leeroy Cha, MD  Primary Cardiologist:Dr. Jeanmarie Mccowen is an 75 y.o. female.    Chief Complaint: Admit 01/08/16 with AMS and fever, cough, abd pain, a fib. 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    HPI:  60yof with complex recent hx.  She has chronic a fib, RA, HTN, mod pulmonary HTN (PASP 48 mmHg) and HLD.  Cardiac cath in 2012 with no significant CAD and normal LV function.     She had been on chronic coumadin for CHA2DS2VASc score of 3.  In Nov she was found to have large pericardial effusion and had pericardiocentesis and drain removing 500 ml of dark sanguinous fluid.  Due to this her coumadin was stopped.  She was admitted again in  Dec.11 with SIRS and diarrhea-C. Diff- she was seen then for rapid A fib.  She was on metoprolol 100 BID and Verapamil 120 BID -rate improved and d/c'd 01/02/16.  Seen by Dr. Irish Lack on the 19th and a fib with rate control.  Plan was to resume coumadin first week of Jan 2018.    Now admitted on the 23rd with fevers, diarrhea, cough-non productive this increased after she saw Dr. Irish Lack. She had loss of appetite.  procalcitonin was 4.  Fever of 102. Admitted with sepsis and IV ABX started.    Her abd CT with renal and splenic infarcts.  Cardiology consulted and IV heparin was started and coumadin per pharmacy.    BNP 120 on admit and now 302 troponins <0.03 X 3 WBC on admit 18.8 now 12.2 Lytes stable  INR 1.99   Echo 12/29/15  EF 50-55% PA pk pressure 34 mmHg, small posterior pericardial effusion Echo 01/09/16  EF 50-55% PA pk pressure was 40 mmHg and no pericardial effusion.  HR was elevated at 130's yesterday -now rate controlled.  EKG on admit a fib with HR 109 and nonspecific T wave abnormality QTC 550 ms EKG yesterday a fib with HR 130 and non specific T wave abnormality. QTC 488 ms  She is on metoprolol 100 BID and verapamil 120  BID. ( could not take po dilt due to lower ext edema. )  Currently HR is fairly controlled.  BP soft at times.  She has had some Rt sided chest pain at times.  She believes her HR increases then she becomes more SOB.  Currently HR in the 51s.    Past Medical History:  Diagnosis Date  . Acne rosacea   . Adhesive capsulitis of left shoulder 1980  . Allergic rhinitis   . Chronic atrial fibrillation ([place])    a. Coumadin anticoagulation - [facility] ([place])  . Chronic bronchitis ([place])   . Esophageal reflux   . Frequent epistaxis    "cause of my coumadin" (12/16/2015)  . Heart murmur   . History of echocardiogram    a. Echo 4/17:  EF 55-60%, no RWMA, mild MR, mild LAE, PASP 48 mmHg, trivial effusion post to heart  . Hypercholesterolemia   . Hyperlipidemia   . Hypertension   . Osteopenia   . Pleural effusion associated with pulmonary infection    Admx 5/17 >> required L thoracentesis (cytology neg for malignancy)   . Pneumonia    "I've had it 4 times this year" (12/16/2015)  . Polymorphic light eruption 2004  . Prolonged QT interval   . Pulmonary HTN   .  Respiratory failure ([place]) 04/2015   "spent 3 days; went home; then another 13 days in hospital" (12/16/2015  . Rheumatoid arthritis ([place]) 1975  . Right middle ear infection 1986  . Skin cancer    "burndt it off at the right side of my nose/face" (12/16/2015)    Past Surgical History:  Procedure Laterality Date  . BREAST BIOPSY Left 2000s X 2  . CARDIAC CATHETERIZATION  03/2010   a. Myoview 2/09: no ischemia; low risk  //  b. [place] 3/12: normal coronary arteries  . CARDIAC CATHETERIZATION N/A 12/16/2015   Procedure: Pericardiocentesis;  Surgeon: Nelva Bush, MD;  Location: [facility];  Service: Cardiovascular;  Laterality: N/A;  . CARDIOVERSION  04/2010  . TEMPOROMANDIBULAR JOINT SURGERY Bilateral   . TOTAL ABDOMINAL HYSTERECTOMY      Family History  Problem Relation Age of Onset  . Heart disease Father     . Heart disease Mother   . Other Mother     prolonged qtc  . Heart disease Sister   . Heart disease Sister   . Healthy Sister   . Fainting Neg Hx    Social History:  reports that she has never smoked. She has never used smokeless tobacco. She reports that she does not drink alcohol or use drugs.  Allergies:  Allergies  Allergen Reactions  . Arthrotec [Diclofenac-Misoprostol] Diarrhea  . Erythromycin Anaphylaxis       . Morphine Sulfate Other (See Comments)    Reaction:  Hallucinations   . Amoxicillin Rash and Other (See Comments)    Has patient had a PCN reaction causing immediate rash, facial/tongue/throat swelling, SOB or lightheadedness with hypotension: Yes Has patient had a PCN reaction causing severe rash involving mucus membranes or skin necrosis: Yes Has patient had a PCN reaction that required hospitalization No Has patient had a PCN reaction occurring within the last 10 years: No If all of the above answers are "NO", then may proceed with Cephalosporin use.  . Diltiazem Hcl Swelling, Rash and Other (See Comments)    Pt is able to tolerate Verapamil.    . Gold-Containing Drug Products Rash  . Macrodantin [Nitrofurantoin Macrocrystal] Rash  . Naproxen Rash  . Penicillins Rash and Other (See Comments)    ++ tolerates cefepime and ceftriaxone ++ Has patient had a PCN reaction causing immediate rash, facial/tongue/throat swelling, SOB or lightheadedness with hypotension: Yes Has patient had a PCN reaction causing severe rash involving mucus membranes or skin necrosis: Yes Has patient had a PCN reaction that required hospitalization No Has patient had a PCN reaction occurring within the last 10 years: No If all of the above answers are "NO", then may proceed with Cephalosporin use.     OUTPATIENT MEDICATIONS: No current facility-administered medications on file prior to encounter.    Current Outpatient Prescriptions on File Prior to Encounter  Medication Sig Dispense  Refill  . acidophilus (RISAQUAD) CAPS capsule Take 1 capsule by mouth daily.    . cholecalciferol (VITAMIN D) 1000 UNITS tablet Take 2,000 Units by mouth daily.    . fluticasone (FLONASE) 50 MCG/ACT nasal spray Place 2 sprays into both nostrils daily as needed for rhinitis.     . folic acid (FOLVITE) 1 MG tablet Take 2 mg by mouth daily.     . furosemide (LASIX) 40 MG tablet Take 40 mg by mouth every evening.    Marland Kitchen guaiFENesin (MUCINEX) 600 MG 12 hr tablet Take 600 mg by mouth 2 (two) times daily as needed  for cough or to loosen phlegm.     . methotrexate (RHEUMATREX) 2.5 MG tablet Take 5-7.5 mg by mouth 2 (two) times a week. Pt takes three tablets every Tuesday and two tablets every Wednesday.  1  . metoprolol succinate (TOPROL-XL) 100 MG 24 hr tablet Take 1 tablet (100 mg total) by mouth 2 (two) times daily. Take with or immediately following a meal. 60 tablet 0  . Multiple Vitamins-Minerals (MULTIVITAMIN WITH MINERALS) tablet Take 1 tablet by mouth daily.    . potassium chloride (K-DUR,KLOR-CON) 10 MEQ tablet Take 10 mEq by mouth daily.    . pravastatin (PRAVACHOL) 20 MG tablet Take 20 mg by mouth at bedtime.    . predniSONE (DELTASONE) 2.5 MG tablet Take 2.5 mg by mouth daily with breakfast.    . ranitidine (ZANTAC) 150 MG tablet Take 1 tablet (150 mg total) by mouth at bedtime. 30 tablet 0  . verapamil (CALAN) 120 MG tablet Take 1 tablet (120 mg total) by mouth every 12 (twelve) hours. 60 tablet 0  . warfarin (COUMADIN) 5 MG tablet Take 1 tablet (5 mg total) by mouth daily. 90 tablet 3  was not on coumadin   CURRENT MEDICATIONS: Scheduled Meds: . acidophilus  1 capsule Oral Daily  . ceFEPime (MAXIPIME) IV  1 g Intravenous Q8H  . chlorhexidine  15 mL Mouth Rinse BID  . cholecalciferol  2,000 Units Oral Daily  . folic acid  2 mg Oral Daily  . levalbuterol  0.63 mg Nebulization QID  . mouth rinse  15 mL Mouth Rinse q12n4p  . metoprolol succinate  100 mg Oral BID  . metroNIDAZOLE  500 mg  Oral TID  . multivitamin with minerals  1 tablet Oral Daily  . pravastatin  20 mg Oral QHS  . predniSONE  2.5 mg Oral Q breakfast  . vancomycin  750 mg Intravenous Q12H  . verapamil  120 mg Oral Q12H  . Warfarin - Pharmacist Dosing Inpatient   Does not apply q1800   Continuous Infusions: . sodium chloride 10 mL/hr at 01/10/16 0514  . heparin 1,450 Units/hr (01/10/16 0519)   PRN Meds:.acetaminophen **OR** acetaminophen, bisacodyl, fluticasone, guaiFENesin, levalbuterol, prochlorperazine, senna-docusate   Results for orders placed or performed during the hospital encounter of 01/08/16 (from the past 48 hour(s))  Basic metabolic panel     Status: Abnormal   Collection Time: 01/08/16 12:57 PM  Result Value Ref Range   Sodium 134 (L) 135 - 145 mmol/L   Potassium 3.8 3.5 - 5.1 mmol/L   Chloride 98 (L) 101 - 111 mmol/L   CO2 24 22 - 32 mmol/L   Glucose, Bld 141 (H) 65 - 99 mg/dL   BUN 11 6 - 20 mg/dL   Creatinine, Ser 0.81 0.44 - 1.00 mg/dL   Calcium 8.9 8.9 - 10.3 mg/dL   GFR calc non Af Amer >60 >60 mL/min   GFR calc Af Amer >60 >60 mL/min    Comment: (NOTE) The eGFR has been calculated using the CKD EPI equation. This calculation has not been validated in all clinical situations. eGFR's persistently <60 mL/min signify possible Chronic Kidney Disease.    Anion gap 12 5 - 15  CBC     Status: Abnormal   Collection Time: 01/08/16 12:57 PM  Result Value Ref Range   WBC 18.8 (H) 4.0 - 10.5 K/uL   RBC 4.61 3.87 - 5.11 MIL/uL   Hemoglobin 13.7 12.0 - 15.0 g/dL   HCT 42.2 36.0 - 46.0 %  MCV 91.5 78.0 - 100.0 fL   MCH 29.7 26.0 - 34.0 pg   MCHC 32.5 30.0 - 36.0 g/dL   RDW 16.0 (H) 11.5 - 15.5 %   Platelets 302 150 - 400 K/uL  Protime-INR     Status: Abnormal   Collection Time: 01/08/16 12:57 PM  Result Value Ref Range   Prothrombin Time 16.0 (H) 11.4 - 15.2 seconds   INR 1.27   Hepatic function panel     Status: None   Collection Time: 01/08/16 12:57 PM  Result Value Ref  Range   Total Protein 7.3 6.5 - 8.1 g/dL   Albumin 3.7 3.5 - 5.0 g/dL   AST 29 15 - 41 U/L   ALT 25 14 - 54 U/L   Alkaline Phosphatase 62 38 - 126 U/L   Total Bilirubin 0.9 0.3 - 1.2 mg/dL   Bilirubin, Direct 0.2 0.1 - 0.5 mg/dL   Indirect Bilirubin 0.7 0.3 - 0.9 mg/dL  Brain natriuretic peptide     Status: Abnormal   Collection Time: 01/08/16 12:57 PM  Result Value Ref Range   B Natriuretic Peptide 120.8 (H) 0.0 - 100.0 pg/mL  I-stat troponin, ED     Status: None   Collection Time: 01/08/16  1:47 PM  Result Value Ref Range   Troponin i, poc 0.00 0.00 - 0.08 ng/mL   Comment 3            Comment: Due to the release kinetics of cTnI, a negative result within the first hours of the onset of symptoms does not rule out myocardial infarction with certainty. If myocardial infarction is still suspected, repeat the test at appropriate intervals.   I-Stat CG4 Lactic Acid, ED     Status: Abnormal   Collection Time: 01/08/16  1:49 PM  Result Value Ref Range   Lactic Acid, Venous 4.83 (HH) 0.5 - 1.9 mmol/L   Comment NOTIFIED PHYSICIAN   Blood culture (routine x 2)     Status: None (Preliminary result)   Collection Time: 01/08/16  2:14 PM  Result Value Ref Range   Specimen Description BLOOD LEFT FOREARM    Special Requests IN PEDIATRIC BOTTLE 5CC    Culture      NO GROWTH < 24 HOURS Performed at [facility]    Report Status PENDING   Urinalysis, Routine w reflex microscopic     Status: Abnormal   Collection Time: 01/08/16  3:07 PM  Result Value Ref Range   Color, Urine AMBER (A) YELLOW    Comment: BIOCHEMICALS MAY BE AFFECTED BY COLOR   APPearance TURBID (A) CLEAR   Specific Gravity, Urine 1.014 1.005 - 1.030   pH 8.0 5.0 - 8.0   Glucose, UA NEGATIVE NEGATIVE mg/dL   Hgb urine dipstick NEGATIVE NEGATIVE   Bilirubin Urine NEGATIVE NEGATIVE   Ketones, ur NEGATIVE NEGATIVE mg/dL   Protein, ur 100 (A) NEGATIVE mg/dL   Nitrite NEGATIVE NEGATIVE   Leukocytes, UA NEGATIVE  NEGATIVE   RBC / HPF NONE SEEN 0 - 5 RBC/hpf   WBC, UA NONE SEEN 0 - 5 WBC/hpf   Bacteria, UA RARE (A) NONE SEEN   Squamous Epithelial / LPF NONE SEEN NONE SEEN   Renal Epithelial 5    Mucous PRESENT    Amorphous Crystal PRESENT   CBG monitoring, ED     Status: Abnormal   Collection Time: 01/08/16  3:13 PM  Result Value Ref Range   Glucose-Capillary 192 (H) 65 - 99 mg/dL  Blood  culture (routine x 2)     Status: None (Preliminary result)   Collection Time: 01/08/16  3:15 PM  Result Value Ref Range   Specimen Description BLOOD RIGHT ANTECUBITAL    Special Requests BOTTLES DRAWN AEROBIC AND ANAEROBIC 5CC    Culture      NO GROWTH < 12 HOURS Performed at [facility]    Report Status PENDING   I-Stat CG4 Lactic Acid, ED     Status: None   Collection Time: 01/08/16  4:28 PM  Result Value Ref Range   Lactic Acid, Venous 1.48 0.5 - 1.9 mmol/L  Lactic acid, plasma     Status: None   Collection Time: 01/08/16  6:33 PM  Result Value Ref Range   Lactic Acid, Venous 1.3 0.5 - 1.9 mmol/L  Troponin I (q 6hr x 3)     Status: None   Collection Time: 01/08/16  6:48 PM  Result Value Ref Range   Troponin I <0.03 <0.03 ng/mL  CBC with Differential     Status: Abnormal   Collection Time: 01/08/16  6:48 PM  Result Value Ref Range   WBC 16.2 (H) 4.0 - 10.5 K/uL   RBC 4.01 3.87 - 5.11 MIL/uL   Hemoglobin 11.9 (L) 12.0 - 15.0 g/dL   HCT 36.6 36.0 - 46.0 %   MCV 91.3 78.0 - 100.0 fL   MCH 29.7 26.0 - 34.0 pg   MCHC 32.5 30.0 - 36.0 g/dL   RDW 15.9 (H) 11.5 - 15.5 %   Platelets 263 150 - 400 K/uL   Neutrophils Relative % 90 %   Neutro Abs 14.6 (H) 1.7 - 7.7 K/uL   Lymphocytes Relative 7 %   Lymphs Abs 1.2 0.7 - 4.0 K/uL   Monocytes Relative 3 %   Monocytes Absolute 0.4 0.1 - 1.0 K/uL   Eosinophils Relative 0 %   Eosinophils Absolute 0.0 0.0 - 0.7 K/uL   Basophils Relative 0 %   Basophils Absolute 0.0 0.0 - 0.1 K/uL  Comprehensive metabolic panel     Status: Abnormal   Collection  Time: 01/08/16  6:48 PM  Result Value Ref Range   Sodium 136 135 - 145 mmol/L   Potassium 3.9 3.5 - 5.1 mmol/L   Chloride 105 101 - 111 mmol/L   CO2 24 22 - 32 mmol/L   Glucose, Bld 112 (H) 65 - 99 mg/dL   BUN 8 6 - 20 mg/dL   Creatinine, Ser 0.65 0.44 - 1.00 mg/dL   Calcium 7.6 (L) 8.9 - 10.3 mg/dL   Total Protein 5.8 (L) 6.5 - 8.1 g/dL   Albumin 2.9 (L) 3.5 - 5.0 g/dL   AST 19 15 - 41 U/L   ALT 17 14 - 54 U/L   Alkaline Phosphatase 50 38 - 126 U/L   Total Bilirubin 0.9 0.3 - 1.2 mg/dL   GFR calc non Af Amer >60 >60 mL/min   GFR calc Af Amer >60 >60 mL/min    Comment: (NOTE) The eGFR has been calculated using the CKD EPI equation. This calculation has not been validated in all clinical situations. eGFR's persistently <60 mL/min signify possible Chronic Kidney Disease.    Anion gap 7 5 - 15  Procalcitonin     Status: None   Collection Time: 01/08/16  6:48 PM  Result Value Ref Range   Procalcitonin 0.12 ng/mL    Comment:        Interpretation: PCT (Procalcitonin) <= 0.5 ng/mL: Systemic infection (sepsis) is not  likely. Local bacterial infection is possible. (NOTE)         ICU PCT Algorithm               Non ICU PCT Algorithm    ----------------------------     ------------------------------         PCT < 0.25 ng/mL                 PCT < 0.1 ng/mL     Stopping of antibiotics            Stopping of antibiotics       strongly encouraged.               strongly encouraged.    ----------------------------     ------------------------------       PCT level decrease by               PCT < 0.25 ng/mL       >= 80% from peak PCT       OR PCT 0.25 - 0.5 ng/mL          Stopping of antibiotics                                             encouraged.     Stopping of antibiotics           encouraged.    ----------------------------     ------------------------------       PCT level decrease by              PCT >= 0.25 ng/mL       < 80% from peak PCT        AND PCT >= 0.5 ng/mL             Continuin g antibiotics                                              encouraged.       Continuing antibiotics            encouraged.    ----------------------------     ------------------------------     PCT level increase compared          PCT > 0.5 ng/mL         with peak PCT AND          PCT >= 0.5 ng/mL             Escalation of antibiotics                                          strongly encouraged.      Escalation of antibiotics        strongly encouraged.   Protime-INR     Status: Abnormal   Collection Time: 01/08/16  6:48 PM  Result Value Ref Range   Prothrombin Time 17.2 (H) 11.4 - 15.2 seconds   INR 1.39   APTT     Status: None   Collection Time: 01/08/16  6:48 PM  Result Value Ref Range   aPTT 34 24 - 36 seconds  Cortisol     Status:  None   Collection Time: 01/08/16  6:48 PM  Result Value Ref Range   Cortisol, Plasma 16.3 ug/dL    Comment: (NOTE) AM    6.7 - 22.6 ug/dL PM   <10.0       ug/dL Performed at [facility]   TSH     Status: None   Collection Time: 01/08/16  6:48 PM  Result Value Ref Range   TSH 1.999 0.350 - 4.500 uIU/mL    Comment: Performed by a 3rd Generation assay with a functional sensitivity of <=0.01 uIU/mL.  Brain natriuretic peptide     Status: Abnormal   Collection Time: 01/08/16  6:48 PM  Result Value Ref Range   B Natriuretic Peptide 302.0 (H) 0.0 - 100.0 pg/mL  Troponin I (q 6hr x 3)     Status: None   Collection Time: 01/08/16 11:04 PM  Result Value Ref Range   Troponin I <0.03 <0.03 ng/mL  Troponin I (q 6hr x 3)     Status: None   Collection Time: 01/09/16  6:09 AM  Result Value Ref Range   Troponin I <0.03 <0.03 ng/mL  Comprehensive metabolic panel     Status: Abnormal   Collection Time: 01/09/16  6:09 AM  Result Value Ref Range   Sodium 135 135 - 145 mmol/L   Potassium 3.9 3.5 - 5.1 mmol/L   Chloride 107 101 - 111 mmol/L   CO2 21 (L) 22 - 32 mmol/L   Glucose, Bld 112 (H) 65 - 99 mg/dL   BUN 6 6 - 20 mg/dL    Creatinine, Ser 0.59 0.44 - 1.00 mg/dL   Calcium 8.1 (L) 8.9 - 10.3 mg/dL   Total Protein 6.0 (L) 6.5 - 8.1 g/dL   Albumin 3.0 (L) 3.5 - 5.0 g/dL   AST 42 (H) 15 - 41 U/L   ALT 28 14 - 54 U/L   Alkaline Phosphatase 55 38 - 126 U/L   Total Bilirubin 1.0 0.3 - 1.2 mg/dL   GFR calc non Af Amer >60 >60 mL/min   GFR calc Af Amer >60 >60 mL/min    Comment: (NOTE) The eGFR has been calculated using the CKD EPI equation. This calculation has not been validated in all clinical situations. eGFR's persistently <60 mL/min signify possible Chronic Kidney Disease.    Anion gap 7 5 - 15  CBC     Status: Abnormal   Collection Time: 01/09/16  6:09 AM  Result Value Ref Range   WBC 12.7 (H) 4.0 - 10.5 K/uL   RBC 4.09 3.87 - 5.11 MIL/uL   Hemoglobin 12.1 12.0 - 15.0 g/dL   HCT 37.3 36.0 - 46.0 %   MCV 91.2 78.0 - 100.0 fL   MCH 29.6 26.0 - 34.0 pg   MCHC 32.4 30.0 - 36.0 g/dL   RDW 16.1 (H) 11.5 - 15.5 %   Platelets 245 150 - 400 K/uL  Heparin level (unfractionated)     Status: Abnormal   Collection Time: 01/09/16  6:09 AM  Result Value Ref Range   Heparin Unfractionated <0.10 (L) 0.30 - 0.70 IU/mL    Comment:        IF HEPARIN RESULTS ARE BELOW EXPECTED VALUES, AND PATIENT DOSAGE HAS BEEN CONFIRMED, SUGGEST FOLLOW UP TESTING OF ANTITHROMBIN III LEVELS.   Protime-INR     Status: Abnormal   Collection Time: 01/09/16  6:09 AM  Result Value Ref Range   Prothrombin Time 16.8 (H) 11.4 - 15.2 seconds   INR 1.35  Gastrointestinal Panel by PCR , Stool     Status: None   Collection Time: 01/09/16  3:02 PM  Result Value Ref Range   Campylobacter species NOT DETECTED NOT DETECTED   Plesimonas shigelloides NOT DETECTED NOT DETECTED   Salmonella species NOT DETECTED NOT DETECTED   Yersinia enterocolitica NOT DETECTED NOT DETECTED   Vibrio species NOT DETECTED NOT DETECTED   Vibrio cholerae NOT DETECTED NOT DETECTED   Enteroaggregative E coli (EAEC) NOT DETECTED NOT DETECTED   Enteropathogenic  E coli (EPEC) NOT DETECTED NOT DETECTED   Enterotoxigenic E coli (ETEC) NOT DETECTED NOT DETECTED   Shiga like toxin producing E coli (STEC) NOT DETECTED NOT DETECTED   Shigella/Enteroinvasive E coli (EIEC) NOT DETECTED NOT DETECTED   Cryptosporidium NOT DETECTED NOT DETECTED   Cyclospora cayetanensis NOT DETECTED NOT DETECTED   Entamoeba histolytica NOT DETECTED NOT DETECTED   Giardia lamblia NOT DETECTED NOT DETECTED   Adenovirus F40/41 NOT DETECTED NOT DETECTED   Astrovirus NOT DETECTED NOT DETECTED   Norovirus GI/GII NOT DETECTED NOT DETECTED   Rotavirus A NOT DETECTED NOT DETECTED   Sapovirus (I, II, IV, and V) NOT DETECTED NOT DETECTED  Heparin level (unfractionated)     Status: Abnormal   Collection Time: 01/09/16  4:26 PM  Result Value Ref Range   Heparin Unfractionated <0.10 (L) 0.30 - 0.70 IU/mL    Comment:        IF HEPARIN RESULTS ARE BELOW EXPECTED VALUES, AND PATIENT DOSAGE HAS BEEN CONFIRMED, SUGGEST FOLLOW UP TESTING OF ANTITHROMBIN III LEVELS.   Protime-INR     Status: Abnormal   Collection Time: 01/10/16  2:12 AM  Result Value Ref Range   Prothrombin Time 22.9 (H) 11.4 - 15.2 seconds   INR 1.99   Heparin level (unfractionated)     Status: Abnormal   Collection Time: 01/10/16  2:12 AM  Result Value Ref Range   Heparin Unfractionated 0.21 (L) 0.30 - 0.70 IU/mL    Comment:        IF HEPARIN RESULTS ARE BELOW EXPECTED VALUES, AND PATIENT DOSAGE HAS BEEN CONFIRMED, SUGGEST FOLLOW UP TESTING OF ANTITHROMBIN III LEVELS.   CBC with Differential/Platelet     Status: Abnormal   Collection Time: 01/10/16  2:12 AM  Result Value Ref Range   WBC 12.2 (H) 4.0 - 10.5 K/uL   RBC 3.46 (L) 3.87 - 5.11 MIL/uL   Hemoglobin 10.3 (L) 12.0 - 15.0 g/dL   HCT 31.6 (L) 36.0 - 46.0 %   MCV 91.3 78.0 - 100.0 fL   MCH 29.8 26.0 - 34.0 pg   MCHC 32.6 30.0 - 36.0 g/dL   RDW 16.1 (H) 11.5 - 15.5 %   Platelets 234 150 - 400 K/uL   Neutrophils Relative % 88 %   Neutro Abs 10.7 (H)  1.7 - 7.7 K/uL   Lymphocytes Relative 7 %   Lymphs Abs 0.9 0.7 - 4.0 K/uL   Monocytes Relative 5 %   Monocytes Absolute 0.6 0.1 - 1.0 K/uL   Eosinophils Relative 0 %   Eosinophils Absolute 0.0 0.0 - 0.7 K/uL   Basophils Relative 0 %   Basophils Absolute 0.0 0.0 - 0.1 K/uL  Comprehensive metabolic panel     Status: Abnormal   Collection Time: 01/10/16  2:12 AM  Result Value Ref Range   Sodium 133 (L) 135 - 145 mmol/L   Potassium 4.0 3.5 - 5.1 mmol/L   Chloride 104 101 - 111 mmol/L   CO2 23 22 -  32 mmol/L   Glucose, Bld 136 (H) 65 - 99 mg/dL   BUN 10 6 - 20 mg/dL   Creatinine, Ser 0.79 0.44 - 1.00 mg/dL   Calcium 8.3 (L) 8.9 - 10.3 mg/dL   Total Protein 5.7 (L) 6.5 - 8.1 g/dL   Albumin 2.8 (L) 3.5 - 5.0 g/dL   AST 28 15 - 41 U/L   ALT 24 14 - 54 U/L   Alkaline Phosphatase 53 38 - 126 U/L   Total Bilirubin 0.5 0.3 - 1.2 mg/dL   GFR calc non Af Amer >60 >60 mL/min   GFR calc Af Amer >60 >60 mL/min    Comment: (NOTE) The eGFR has been calculated using the CKD EPI equation. This calculation has not been validated in all clinical situations. eGFR's persistently <60 mL/min signify possible Chronic Kidney Disease.    Anion gap 6 5 - 15  Magnesium     Status: None   Collection Time: 01/10/16  2:12 AM  Result Value Ref Range   Magnesium 1.8 1.7 - 2.4 mg/dL  Phosphorus     Status: None   Collection Time: 01/10/16  2:12 AM  Result Value Ref Range   Phosphorus 2.7 2.5 - 4.6 mg/dL  Blood gas, venous     Status: Abnormal   Collection Time: 01/10/16  2:28 AM  Result Value Ref Range   O2 Content 4.0 L/min   Delivery systems NASAL CANNULA    pH, Ven 7.383 7.250 - 7.430   pCO2, Ven 39.4 (L) 44.0 - 60.0 mmHg   pO2, Ven BELOW REPORTABLE RANGE 32.0 - 45.0 mmHg    Comment: CRITICAL RESULT CALLED TO, READ BACK BY AND VERIFIED WITH: [name] CLARK,RN AT 0236 BY AMY RAY,RRT,RCP ON 01/10/2016    Bicarbonate 22.8 20.0 - 28.0 mmol/L   Acid-base deficit 1.4 0.0 - 2.0 mmol/L   O2 Saturation  58.7 %   Patient temperature 99.5    Collection site LEFT RADIAL    Drawn by [id number]    Sample type VENOUS   Heparin level (unfractionated)     Status: Abnormal   Collection Time: 01/10/16  3:37 AM  Result Value Ref Range   Heparin Unfractionated 0.23 (L) 0.30 - 0.70 IU/mL    Comment:        IF HEPARIN RESULTS ARE BELOW EXPECTED VALUES, AND PATIENT DOSAGE HAS BEEN CONFIRMED, SUGGEST FOLLOW UP TESTING OF ANTITHROMBIN III LEVELS.    Dg Chest 1 View  Result Date: 01/10/2016 CLINICAL DATA:  Cough. EXAM: CHEST 1 VIEW COMPARISON:  01/09/2016 FINDINGS: Enlargement of the cardiac silhouette is unchanged. Lung volumes are diminished, more so than on the prior study. There is peribronchial cuffing and diffuse interstitial accentuation which have increased from the prior study. Patchy opacities in both lung bases have also increased. Small pleural effusions persist. No pneumothorax is seen. No acute osseous abnormality is identified. IMPRESSION: 1. Cardiomegaly with likely mild interstitial edema and small pleural effusions. 2. Low lung volumes with increased bibasilar opacities, likely atelectasis. Electronically Signed   By: Logan Bores M.D.   On: 01/10/2016 06:42   Ct Head Wo Contrast  Result Date: 01/08/2016 CLINICAL DATA:  Fever with altered mental status. EXAM: CT HEAD WITHOUT CONTRAST TECHNIQUE: Contiguous axial images were obtained from the base of the skull through the vertex without intravenous contrast. COMPARISON:  12/28/2015 FINDINGS: Brain: No evidence of acute infarction, hemorrhage, hydrocephalus, extra-axial collection or mass lesion/mass effect. Stable mild brain parenchymal atrophy and microangiopathic changes in the deep white  matter. Vascular: No hyperdense vessel or unexpected calcification. Skull: Normal. Negative for fracture or focal lesion. Sinuses/Orbits: No acute finding. Other: None. IMPRESSION: No acute intracranial abnormality. Stable mild brain parenchymal atrophy and  chronic microvascular disease. Electronically Signed   By: Fidela Salisbury M.D.   On: 01/08/2016 17:33   Ct Chest W Contrast  Result Date: 01/08/2016 CLINICAL DATA:  Altered mental status. Fever. Cough. Abdominal pain. Diarrhea. Atrial fibrillation on Coumadin. Hypertension. Possible pneumonia or colitis. EXAM: CT CHEST, ABDOMEN, AND PELVIS WITH CONTRAST TECHNIQUE: Multidetector CT imaging of the chest, abdomen and pelvis was performed following the standard protocol during bolus administration of intravenous contrast. CONTRAST:  162m ISOVUE-300 IOPAMIDOL (ISOVUE-300) INJECTION 61% COMPARISON:  Plain films 01/08/2016. Chest CT 12/15/2015. Abdominopelvic CT 12/29/2015. FINDINGS: CT CHEST FINDINGS Cardiovascular: Aortic and branch vessel atherosclerosis. Tortuous thoracic aorta. Moderate cardiomegaly. Small pericardial effusion is similar to slightly increased since 12/29/2015. No central pulmonary embolism, on this non-dedicated study. Mediastinum/Nodes: Enlargement of a node within the azygoesophageal recess at 1.4 cm on image 26/series 2. Favored to be reactive, given development over the past 3 weeks. No hilar adenopathy. Tiny hiatal hernia. Lungs/Pleura: Small, right greater than left pleural effusions. Mild to moderate degradation secondary to motion and patient arm position, not raised above the head. Bibasilar atelectasis. Musculoskeletal: Lower thoracic hypoattenuating lesions are likely hemangiomas. CT ABDOMEN PELVIS FINDINGS Hepatobiliary: Hepatomegaly at 22.6 cm. Small gallstones without acute cholecystitis or biliary duct dilatation. Pancreas: Normal, without mass or ductal dilatation. Spleen: Splenic hypoattenuation is wedge-shaped, primarily superiorly on image 49/series 2. New since the prior. Adrenals/Urinary Tract: Normal adrenal glands. Normal left kidney. New hypoattenuation involving the lower pole right kidney is suspicious for infarct, including on image 75/ series 2. No  hydronephrosis. Normal urinary bladder. Stomach/Bowel: Normal remainder of the stomach. Colonic stool burden suggests constipation. Normal small bowel. Vascular/Lymphatic: Aortic and branch vessel atherosclerosis. No abdominopelvic adenopathy. Reproductive: Hysterectomy.  No adnexal mass. Other: No significant free fluid. Musculoskeletal: Lumbosacral spondylosis with degenerative disc disease at the lumbosacral junction. IMPRESSION: 1. multifactorial degradation, especially involving the chest. 2. 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 Suspect a central embolic source. Consider echocardiography. 3. Cholelithiasis. 4. Small bilateral pleural effusions with bibasilar atelectasis. 5. Cardiomegaly with slight increase in small pericardial effusion. 6.  Aortic atherosclerosis. These results will be called to the ordering clinician or representative by the Radiologist Assistant, and communication documented in the PACS or zVision Dashboard. Electronically Signed   By: KAbigail MiyamotoM.D.   On: 01/08/2016 17:54   Ct Abdomen Pelvis W Contrast  Result Date: 01/08/2016 CLINICAL DATA:  Altered mental status. Fever. Cough. Abdominal pain. Diarrhea. Atrial fibrillation on Coumadin. Hypertension. Possible pneumonia or colitis. EXAM: CT CHEST, ABDOMEN, AND PELVIS WITH CONTRAST TECHNIQUE: Multidetector CT imaging of the chest, abdomen and pelvis was performed following the standard protocol during bolus administration of intravenous contrast. CONTRAST:  1069mISOVUE-300 IOPAMIDOL (ISOVUE-300) INJECTION 61% COMPARISON:  Plain films 01/08/2016. Chest CT 12/15/2015. Abdominopelvic CT 12/29/2015. FINDINGS: CT CHEST FINDINGS Cardiovascular: Aortic and branch vessel atherosclerosis. Tortuous thoracic aorta. Moderate cardiomegaly. Small pericardial effusion is similar to slightly increased since 12/29/2015. No central pulmonary embolism, on this non-dedicated study. Mediastinum/Nodes: Enlargement of a node within the  azygoesophageal recess at 1.4 cm on image 26/series 2. Favored to be reactive, given development over the past 3 weeks. No hilar adenopathy. Tiny hiatal hernia. Lungs/Pleura: Small, right greater than left pleural effusions. Mild to moderate degradation secondary to motion and patient arm position, not raised  above the head. Bibasilar atelectasis. Musculoskeletal: Lower thoracic hypoattenuating lesions are likely hemangiomas. CT ABDOMEN PELVIS FINDINGS Hepatobiliary: Hepatomegaly at 22.6 cm. Small gallstones without acute cholecystitis or biliary duct dilatation. Pancreas: Normal, without mass or ductal dilatation. Spleen: Splenic hypoattenuation is wedge-shaped, primarily superiorly on image 49/series 2. New since the prior. Adrenals/Urinary Tract: Normal adrenal glands. Normal left kidney. New hypoattenuation involving the lower pole right kidney is suspicious for infarct, including on image 75/ series 2. No hydronephrosis. Normal urinary bladder. Stomach/Bowel: Normal remainder of the stomach. Colonic stool burden suggests constipation. Normal small bowel. Vascular/Lymphatic: Aortic and branch vessel atherosclerosis. No abdominopelvic adenopathy. Reproductive: Hysterectomy.  No adnexal mass. Other: No significant free fluid. Musculoskeletal: Lumbosacral spondylosis with degenerative disc disease at the lumbosacral junction. IMPRESSION: 1. multifactorial degradation, especially involving the chest. 2. 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 Suspect a central embolic source. Consider echocardiography. 3. Cholelithiasis. 4. Small bilateral pleural effusions with bibasilar atelectasis. 5. Cardiomegaly with slight increase in small pericardial effusion. 6.  Aortic atherosclerosis. These results will be called to the ordering clinician or representative by the Radiologist Assistant, and communication documented in the PACS or zVision Dashboard. Electronically Signed   By: Abigail Miyamoto M.D.   On:  01/08/2016 17:54   Dg Chest Port 1 View  Result Date: 01/09/2016 CLINICAL DATA:  75 year old female with sepsis. EXAM: PORTABLE CHEST 1 VIEW COMPARISON:  Chest radiograph dated 01/08/2016 and CT dated 01/08/2016 FINDINGS: There are small bilateral pleural effusions, left greater right and bibasilar hazy densities compatible with atelectasis versus infiltrate. There is moderate cardiomegaly. No pneumothorax. Osteopenia with degenerative changes of the spine. No acute osseous pathology. IMPRESSION: Small bilateral pleural effusions and bibasilar atelectasis versus infiltrate. Moderate cardiomegaly. Electronically Signed   By: Anner Crete M.D.   On: 01/09/2016 06:24   Dg Abd Acute W/chest  Result Date: 01/08/2016 CLINICAL DATA:  Abdominal pain, weakness, nausea/vomiting EXAM: DG ABDOMEN ACUTE W/ 1V CHEST COMPARISON:  CT abdomen/pelvis dated 12/29/2015 FINDINGS: Small bilateral pleural effusions. Possible mild perihilar edema. Mild left lower lobe opacity, likely atelectasis. Cardiomegaly. Nonobstructive bowel gas pattern. No evidence of free air on the lateral decubitus view. Mild degenerative changes the visualized thoracolumbar spine. IMPRESSION: Cardiomegaly with possible mild perihilar edema. Small bilateral pleural effusions. Mild left lower lobe opacity, likely atelectasis. No evidence of small bowel obstruction or free air. Electronically Signed   By: Julian Hy M.D.   On: 01/08/2016 13:48    ROS: General:no colds + fevers, weight stable, in Sept was 151 in Nov 148 Skin:no rashes or ulcers HEENT:no blurred vision, no congestion CV:see HPI PUL:see HPI GI:+ diarrhea noconstipation or melena, no indigestion GU:no hematuria, no dysuria MS:no joint pain, no claudication Neuro:no syncope, no lightheadedness Endo:no diabetes, no thyroid disease    :Blood pressure 122/64, pulse 95, temperature 98.1 F (36.7 C), temperature source Oral, resp. rate (!) 25, height '5\' 3"'$  (1.6 m),  weight 145 lb 8.1 oz (66 kg), SpO2 100 %.  Wt Readings from Last 3 Encounters:  01/08/16 145 lb 8.1 oz (66 kg)  01/04/16 144 lb 1.9 oz (65.4 kg)  12/28/15 143 lb 11.8 oz (65.2 kg)    PE: General:Pleasant affect, NAD but obviously ill Skin:Warm and dry, brisk capillary refill HEENT:normocephalic, sclera clear, mucus membranes moist Neck:supple, no to minimal JVD, no bruits  Heart:irreg irreg without murmur, gallup, rub or click Lungs: with some rales in the bases, +rhonchi, or wheezes BOF:BPZW, non tender, + BS, do not palpate liver spleen or  masses Ext:no lower ext edema, 2+ pedal pulses, 2+ radial pulses Neuro:alert and oriented X 3, MAE, follows commands, + facial symmetry    Assessment/Plan Active Problems:   Atrial fibrillation (HCC)   Hypertension   Hyperlipidemia   Chest pain   QT prolongation   Rheumatoid arthritis (HCC)   Pleuritic chest pain   Persistent atrial fibrillation (HCC)   Pulmonary hypertension   Sepsis ([place])   Diarrhea   Chronic systolic CHF (congestive heart failure) ([place])   Immunosuppressed status (HCC)  A fib with RVR at times on home metoprolol and verapamil  She has been off anticoagulation since 12/17/15 until yesterday.   If HR increases could use IV amiodarone- but monitor QTC.  Other option if BP allows is IV lopressor.  Dr. Haroldine Laws to see  Pericardial Effusion- with bloody aspirant coumadin has been on hold.  Recent echo without effusion- now on IV heparin and coumadin.  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 We discussed all aspects of the encounter. I agree with the assessment and plan as stated above.   I have reviewed tele and recent echo images personally.   75 y/o woman with chronic AF  admitted with sepsis. Hospitalization complicated by increase in chronic AF rate. Now improving with resolution of sepsis. HRs 60-70s on tele with occasional spike to 10.Tolerating metoprolol and verapamil.  No recurrent pericardial effusion on echo. On heparin/warfarin now.   Would continue current management. Can switch to IV diltiazem if needed for rate control but doubt she will need it now that sepsis is resolving.   We will sign off. Please call with questions.   Jontavius Rabalais,MD 2:19 PM

## 2016-01-10 NOTE — Progress Notes (Addendum)
PROGRESS NOTE    [name]  [id number] DOB: Aug 10, 1940 DOA: 01/08/2016 PCP: Leeroy Cha, MD   Brief Narrative:  [name] is a 75 y.o. 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 Patient was discharged home and did well the first few days but then states lost her appetite and started coughing and had worsening diarrhea over the course of the week. Patient states she became more fatigued and developed fevers and did not want to come back to the ER at the urge of her daughter. She Admitted to Nausea but no vomiting and states she has chest wall pain and tenderness from coughing. Patient thinks liquidly diarrhea is getting worse and has had countless episodes to the point where she defecates on herself. No blood in stool noted. Has had fevers and chills at home and has worsened since discharge last Sunday. Per daughter has been so weak and has only lying in bed and not been eating. Saw her Cardiologist 4 days ago and states that when she started feeling worse. States feels very similar to last hospitalization. No other concerns or complaints at this time. Hospitalist was called to Admit because of Sepsis.  Assessment & Plan:   Active Problems:   Atrial fibrillation (HCC)   Hypertension   Hyperlipidemia   Chest pain   QT prolongation   Rheumatoid arthritis (HCC)   Pleuritic chest pain   Persistent atrial fibrillation (HCC)   Pulmonary hypertension   Sepsis (HCC)   Diarrhea   Chronic systolic CHF (congestive heart failure) ([place])   Immunosuppressed status ([place])  Sepsis of Unclear Etiology possible from Health Care Acquired Pneumonia vs. Gastrointestinal Illness r/o C Diff -Patient is  Septic on Admission with Fever of 102, Tachycardia, Tachypnea, Leukocytosis of 18.8 and Lactic Acid Level of 4.83. Etiology is not clear at this point and CT of Chest/Abd/Pelvis Pending.  -Possibly from Pneumonia vs. C. Difficile Colitis? -Sepsis Bundle Protocol -Check Procalcitonin and Repeat Lactic Acid Level per Protocol - Repeat Lactic Acid was 1.48 -Check C. Difficile PCR Toxin and Antigen; Pending -Blood Cx's x2, Sputum Cx; Urinalysis Negative  -Urine Cx Showed Multiple Species Present -Normal Saline (30 mL/kg) initial Bolus and then 100 mL/hr; IVF Now D/C'd -Chest and Abdominal Imaging X-Ray showed Cardiomegaly with possible mild perihilar edema. Small bilateral pleural effusions.Mild left lower lobe opacity, likely atelectasis. No evidence of small bowel obstruction or free air. -WBC went from 18.8 -> 12.2 -Repeat CXR in AM -C/w IV Vancomycin/IV Cefepime/ and po Metronidazole -Follow blood culture, urine culture and sputum Cx -CT of Chest/Abd/Pelvis Not reveling infection -?Embolic Fever; May consult Infectious Diseases in AM  Diarrhea r/o C. 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  -Stomach/Bowel: Normal remainder of the stomach. Colonic stool burden suggests constipation. Normal small bowel. -C Diff PCR ordered; Patient stopped having diarrhea -GI Stool Panel Negative -C/w Home Metronidazole 500 mg po q8h and Acidophilus -Diet Advanced  Cough with Non-Productive Sputum r/o HCAP -CT of Chest Showed Multifactorial Degradation and Small Bilateral Pleural Effusions with Bibasilar Atelectasis -C/w Xopenex q6hprn -IV Vanc and Cefepime per Pharmacy Dosing  Visual Hallucinations -Head CT Negative and showed No acute intracranial abnormality. Stable mild brain parenchymal atrophy and chronic microvascular disease. -  Avoid Sedatives  Mild Hyponatremia -Patients Na+ was 135 -Repeat CMP in AM  Atypical CP r/o  ACS -Troponin I x 3 Negative -Repeat EKG showed qTC of 510; No ST Elevation or Depression but did show Atrial Fibrillation with RVR  Prolonged qTC -EKG this Admission was 550 -Check EKG in AM -Avoid Zofran, Phenergan, Ranitidine; Started Compazine -Monitor SDU with Telemetry  Generalized Weakness and Deconditioning -PT/OT Evaluation -C/w IVF  -Nutritionist Consult  Pericardial Effusion:  -S/p of pericardiocentesis and Pericardial drain placement with removal of 500 mL of Dark Sanguinous Fluid during the Admission of 11/29-12/3. -She underwent repeatedechocardiograms without significant fluid accumulation before discharge. Had Echo Done prior to Last Discharge. -Did not want Colchicine for Pericarditis colchicine -CT of the Chest showed Cardiomegaly with slight increase in small pericardial effusion. -ECHOCardiogram did not show Pericardial Effusion.  Atrial Fibrillation with RVR:  -Recently Saw Dr. Irish Lack on 01/04/16 -CHA2DS2-VASc Scoreis 3 - Was on Oral Anticoagulation with Coumadin *currently held due to Bloody Pericardial Effusion -Coumadin was held because of Bloody Pericardial Effusion; Per notes was to be started During the First Week of January; However given CT ABD/PELVIS FINDINGS RESTARTED HEPARIN gtt and Warfarain -Restarted Metoprolol and Verapamil  -C/w Telemetry Monitoring -ECHO as below -Cardiology Consulted. Appreciate Dr. Haroldine Laws Recc's - recommends continuing current management and continue with po Metoprolol and Verapamil. C/w Heparin/Warfarin.   Rheumatoid Arthritis:  -On prednisone 2.5 mg daily and continued,  -Hold Home Methotrexate and Etenercept. Clinically stable. -Check Cortisol level -May Need Stress Dose Steroids  HLD: -Last LDL was79 on 05/21/13 -Continue home medications:Pravastatin   GERD: -Avoid PPI and H2 Blocker in the Setting of Possible C.Diff and qTC prolongation  HTN: -Hold Lasix -Restart Metoprolol and  Verapamil -Continue to Monitor Closely  Chronic systolic congestive heart failure: -Transthoracic ECHO on 12/29/15 showed EF of 50-55 %. However previous [facility] showed EF of 40-45%.  -BNP was 302.0 -Given IV Lasix once this AM  DVT prophylaxis: Heparin gtt and Warfarin Code Status: FULL CODE Family Communication: No Family present at bedside Disposition Plan: Remain in SDU today as BP were on soft side  Consultants:   None  Procedures: ECHOcardiogram  Antimicrobials: IV Cefepime, po Metronidazole, and IV Vancomycin  Subjective: Seen and examined at bedside and states she still felt bad. No watery diarrhea. States she got SOB last night. No other concerns or complaints and just wants to feel better.   Objective: Vitals:   01/10/16 1700 01/10/16 1800 01/10/16 2000 01/10/16 2003  BP: (!) 96/58 103/66    Pulse: 86 98    Resp: (!) 29 (!) 27    Temp:   99.5 F (37.5 C)   TempSrc:   Oral   SpO2: 95% 98%  97%  Weight:      Height:        Intake/Output Summary (Last 24 hours) at 01/10/16 2025 Last data filed at 01/10/16 1935  Gross per 24 hour  Intake          1124.45 ml  Output              800 ml  Net           324.45 ml   Filed Weights   01/08/16 1800  Weight: 66 kg (145 lb 8.1 oz)    Examination: Physical Exam:  Constitutional: Caucasian female in no acute distress  Eyes: Lids and conjunctivae normal, sclerae anicteric  ENMT: External Ears, Nose appear normal. Grossly normal hearing.  Neck: Appears normal, supple,  no cervical masses, normal ROM, no appreciable thyromegaly, no JVD.  Respiratory: Diminished to auscultation bilaterally, No wheezing, rales, rhonchi. Mild crackles. Slightly increased respiratory effort and patient is Slightly tachypenic. No accessory muscle use.  Cardiovascular: Irregularly Irregular, no murmurs / rubs / gallops. S1 and S2 auscultated. No extremity edema. Abdomen: Soft, Nontender on palpation, non-distended. No masses palpated. No  appreciable hepatosplenomegaly. Bowel sounds positive x4.  GU: Deferred. Musculoskeletal: No clubbing / cyanosis of digits/nails. No joint deformity upper and lower extremities.  Skin: No rashes, lesions, ulcers on limited skin evaluation. No induration; Warm and dry. Mild Right Arm Swelling from IV infiltration.  Neurologic: CN 2-12 grossly intact with no focal deficits. Sensation intact in all 4 Extremities. Romberg sign cerebellar reflexes not assessed.  Psychiatric: Normal judgment and insight. Alert and oriented x 3. Depressed mood and appropriate affect  Data Reviewed: I have personally reviewed following labs and imaging studies  CBC:  Recent Labs Lab 01/08/16 1257 01/08/16 1848 01/09/16 0609 01/10/16 0212  WBC 18.8* 16.2* 12.7* 12.2*  NEUTROABS  --  14.6*  --  10.7*  HGB 13.7 11.9* 12.1 10.3*  HCT 42.2 36.6 37.3 31.6*  MCV 91.5 91.3 91.2 91.3  PLT 302 263 245 [id number]   Basic Metabolic Panel:  Recent Labs Lab 01/08/16 1257 01/08/16 1848 01/09/16 0609 01/10/16 0212  NA 134* 136 135 133*  K 3.8 3.9 3.9 4.0  CL 98* 105 107 104  CO2 24 24 21* 23  GLUCOSE 141* 112* 112* 136*  BUN 11 8 6 10   CREATININE 0.81 0.65 0.59 0.79  CALCIUM 8.9 7.6* 8.1* 8.3*  MG  --   --   --  1.8  PHOS  --   --   --  2.7   GFR: Estimated Creatinine Clearance: 56.3 mL/min (by C-G formula based on SCr of 0.79 mg/dL). Liver Function Tests:  Recent Labs Lab 01/08/16 1257 01/08/16 1848 01/09/16 0609 01/10/16 0212  AST 29 19 42* 28  ALT 25 17 28 24   ALKPHOS 62 50 55 53  BILITOT 0.9 0.9 1.0 0.5  PROT 7.3 5.8* 6.0* 5.7*  ALBUMIN 3.7 2.9* 3.0* 2.8*   No results for input(s): LIPASE, AMYLASE in the last 168 hours. No results for input(s): AMMONIA in the last 168 hours. Coagulation Profile:  Recent Labs Lab 01/08/16 1257 01/08/16 1848 01/09/16 0609 01/10/16 0212  INR 1.27 1.39 1.35 1.99   Cardiac Enzymes:  Recent Labs Lab 01/08/16 1848 01/08/16 2304 01/09/16 0609  TROPONINI  <0.03 <0.03 <0.03   BNP (last 3 results)  Recent Labs  12/13/15 1704  PROBNP 215.0*   HbA1C: No results for input(s): HGBA1C in the last 72 hours. CBG:  Recent Labs Lab 01/08/16 1513  GLUCAP 192*   Lipid Profile: No results for input(s): CHOL, HDL, LDLCALC, TRIG, CHOLHDL, LDLDIRECT in the last 72 hours. Thyroid Function Tests:  Recent Labs  01/08/16 1848  TSH 1.999   Anemia Panel: No results for input(s): VITAMINB12, FOLATE, FERRITIN, TIBC, IRON, RETICCTPCT in the last 72 hours. Sepsis Labs:  Recent Labs Lab 01/08/16 1349 01/08/16 1628 01/08/16 1833 01/08/16 1848  PROCALCITON  --   --   --  0.12  LATICACIDVEN 4.83* 1.48 1.3  --     Recent Results (from the past 240 hour(s))  Blood culture (routine x 2)     Status: None (Preliminary result)   Collection Time: 01/08/16  2:14 PM  Result Value Ref Range Status   Specimen Description BLOOD LEFT FOREARM  Final   Special Requests IN PEDIATRIC BOTTLE 5CC  Final   Culture   Final    NO GROWTH 2 DAYS Performed at [facility]    Report Status PENDING  Incomplete  Urine culture     Status: None   Collection Time: 01/08/16  3:07 PM  Result Value Ref Range Status   Specimen Description URINE, CATHETERIZED  Final   Special Requests NONE  Final   Culture NO GROWTH Performed at [facility]   Final   Report Status 01/10/2016 FINAL  Final  Blood culture (routine x 2)     Status: None (Preliminary result)   Collection Time: 01/08/16  3:15 PM  Result Value Ref Range Status   Specimen Description BLOOD RIGHT ANTECUBITAL  Final   Special Requests BOTTLES DRAWN AEROBIC AND ANAEROBIC 5CC  Final   Culture   Final    NO GROWTH 2 DAYS Performed at [facility]    Report Status PENDING  Incomplete  Culture, blood (x 2)     Status: None (Preliminary result)   Collection Time: 01/08/16  6:48 PM  Result Value Ref Range Status   Specimen Description BLOOD LEFT ANTECUBITAL  Final   Special Requests IN  PEDIATRIC BOTTLE [place]  Final   Culture   Final    NO GROWTH 1 DAY Performed at [facility]    Report Status PENDING  Incomplete  Urine culture     Status: Abnormal   Collection Time: 01/08/16  8:12 PM  Result Value Ref Range Status   Specimen Description URINE, RANDOM  Final   Special Requests NONE  Final   Culture MULTIPLE SPECIES PRESENT, SUGGEST RECOLLECTION (A)  Final   Report Status 01/10/2016 FINAL  Final  Culture, blood (x 2)     Status: None (Preliminary result)   Collection Time: 01/08/16 11:04 PM  Result Value Ref Range Status   Specimen Description BLOOD BLOOD LEFT FOREARM  Final   Special Requests IN PEDIATRIC BOTTLE [place]  Final   Culture   Final    NO GROWTH 1 DAY Performed at [facility]    Report Status PENDING  Incomplete  Gastrointestinal Panel by PCR , Stool     Status: None   Collection Time: 01/09/16  3:02 PM  Result Value Ref Range Status   Campylobacter species NOT DETECTED NOT DETECTED Final   Plesimonas shigelloides NOT DETECTED NOT DETECTED Final   Salmonella species NOT DETECTED NOT DETECTED Final   Yersinia enterocolitica NOT DETECTED NOT DETECTED Final   Vibrio species NOT DETECTED NOT DETECTED Final   Vibrio cholerae NOT DETECTED NOT DETECTED Final   Enteroaggregative E coli (EAEC) NOT DETECTED NOT DETECTED Final   Enteropathogenic E coli (EPEC) NOT DETECTED NOT DETECTED Final   Enterotoxigenic E coli (ETEC) NOT DETECTED NOT DETECTED Final   Shiga like toxin producing E coli (STEC) NOT DETECTED NOT DETECTED Final   Shigella/Enteroinvasive E coli (EIEC) NOT DETECTED NOT DETECTED Final   Cryptosporidium NOT DETECTED NOT DETECTED Final   Cyclospora cayetanensis NOT DETECTED NOT DETECTED Final   Entamoeba histolytica NOT DETECTED NOT DETECTED Final   Giardia lamblia NOT DETECTED NOT DETECTED Final   Adenovirus F40/41 NOT DETECTED NOT DETECTED Final   Astrovirus NOT DETECTED NOT DETECTED Final   Norovirus GI/GII NOT DETECTED NOT  DETECTED Final   Rotavirus A NOT DETECTED NOT DETECTED Final   Sapovirus (I, II, IV, and V) NOT DETECTED NOT DETECTED Final    Radiology  Studies: Dg Chest 1 View  Result Date: 01/10/2016 CLINICAL DATA:  Cough. EXAM: CHEST 1 VIEW COMPARISON:  01/09/2016 FINDINGS: Enlargement of the cardiac silhouette is unchanged. Lung volumes are diminished, more so than on the prior study. There is peribronchial cuffing and diffuse interstitial accentuation which have increased from the prior study. Patchy opacities in both lung bases have also increased. Small pleural effusions persist. No pneumothorax is seen. No acute osseous abnormality is identified. IMPRESSION: 1. Cardiomegaly with likely mild interstitial edema and small pleural effusions. 2. Low lung volumes with increased bibasilar opacities, likely atelectasis. Electronically Signed   By: Logan Bores M.D.   On: 01/10/2016 06:42   Dg Chest Port 1 View  Result Date: 01/09/2016 CLINICAL DATA:  75 year old female with sepsis. EXAM: PORTABLE CHEST 1 VIEW COMPARISON:  Chest radiograph dated 01/08/2016 and CT dated 01/08/2016 FINDINGS: There are small bilateral pleural effusions, left greater right and bibasilar hazy densities compatible with atelectasis versus infiltrate. There is moderate cardiomegaly. No pneumothorax. Osteopenia with degenerative changes of the spine. No acute osseous pathology. IMPRESSION: Small bilateral pleural effusions and bibasilar atelectasis versus infiltrate. Moderate cardiomegaly. Electronically Signed   By: Anner Crete M.D.   On: 01/09/2016 06:24   ECHOCARDIOGRAM Study Conclusions  - Left ventricle: The cavity size was normal. There was mild   concentric hypertrophy. Systolic function was normal. The   estimated ejection fraction was in the range of 50% to 55%. Wall   motion was normal; there were no regional wall motion   abnormalities. The study was not technically sufficient to allow   evaluation of LV diastolic  dysfunction due to atrial   fibrillation. - Aortic valve: Trileaflet; normal thickness leaflets. There was no   regurgitation. - Aortic root: The aortic root was normal in size. - Ascending aorta: The ascending aorta was normal in size. - Mitral valve: Structurally normal valve. There was mild   regurgitation. - Left atrium: The atrium was mildly dilated. - Right ventricle: The cavity size was normal. Wall thickness was   normal. Systolic function was normal. - Right atrium: The atrium was normal in size. - Tricuspid valve: There was mild regurgitation. - Pulmonary arteries: Systolic pressure was mildly increased. PA   peak pressure: 40 mm Hg (S). - Inferior vena cava: The vessel was normal in size. - Pericardium, extracardiac: There was no pericardial effusion.  Scheduled Meds: . acidophilus  1 capsule Oral Daily  . ceFEPime (MAXIPIME) IV  1 g Intravenous Q8H  . chlorhexidine  15 mL Mouth Rinse BID  . cholecalciferol  2,000 Units Oral Daily  . folic acid  2 mg Oral Daily  . levalbuterol  0.63 mg Nebulization QID  . mouth rinse  15 mL Mouth Rinse q12n4p  . metoprolol succinate  100 mg Oral BID  . metroNIDAZOLE  500 mg Oral TID  . multivitamin with minerals  1 tablet Oral Daily  . pravastatin  20 mg Oral QHS  . predniSONE  2.5 mg Oral Q breakfast  . vancomycin  750 mg Intravenous Q12H  . verapamil  120 mg Oral Q12H  . Warfarin - Pharmacist Dosing Inpatient   Does not apply q1800   Continuous Infusions: . sodium chloride 10 mL/hr at 01/10/16 0514  . heparin 1,500 Units/hr (01/10/16 1415)     LOS: 2 days   Kerney Elbe, DO [facility] Pager [phone]  If 7PM-7AM, please contact night-coverage [email or web address] Password [email or web address] 01/10/2016, 8:25 PM

## 2016-01-10 NOTE — Progress Notes (Addendum)
Night float/ Called by rn for increasing sob/resp distress., requiring more o2. crackels at bases per rn. Per rn, pt alert/mentating well. + 4400 ml total so far.  Venous bg 7.39/31/38% Lasix 20mg  iv x 1 Trial bipap for chf., titrate for comfort.   Addendum 5am Called and talked w/ RN, pt doing better after lasix, still no final output measure after lasix, but pt now urinating, rr improved and more comfortable.   Did not need bipap. kvo ivf.

## 2016-01-10 NOTE — Progress Notes (Signed)
ANTICOAGULATION CONSULT NOTE - Follow Up Consult  Pharmacy Consult for heparin and warfarin Indication: hx atrial fibrillation; new renal and splenic infarcts  Allergies  Allergen Reactions  . Arthrotec [Diclofenac-Misoprostol] Diarrhea  . Erythromycin Anaphylaxis       . Morphine Sulfate Other (See Comments)    Reaction:  Hallucinations   . Amoxicillin Rash and Other (See Comments)    Has patient had a PCN reaction causing immediate rash, facial/tongue/throat swelling, SOB or lightheadedness with hypotension: Yes Has patient had a PCN reaction causing severe rash involving mucus membranes or skin necrosis: Yes Has patient had a PCN reaction that required hospitalization No Has patient had a PCN reaction occurring within the last 10 years: No If all of the above answers are "NO", then may proceed with Cephalosporin use.  . Diltiazem Hcl Swelling, Rash and Other (See Comments)    Pt is able to tolerate Verapamil.    . Gold-Containing Drug Products Rash  . Macrodantin [Nitrofurantoin Macrocrystal] Rash  . Naproxen Rash  . Penicillins Rash and Other (See Comments)    ++ tolerates cefepime and ceftriaxone ++ Has patient had a PCN reaction causing immediate rash, facial/tongue/throat swelling, SOB or lightheadedness with hypotension: Yes Has patient had a PCN reaction causing severe rash involving mucus membranes or skin necrosis: Yes Has patient had a PCN reaction that required hospitalization No Has patient had a PCN reaction occurring within the last 10 years: No If all of the above answers are "NO", then may proceed with Cephalosporin use.     Patient Measurements: Height: 5\' 3"  (160 cm) Weight: 145 lb 8.1 oz (66 kg) IBW/kg (Calculated) : 52.4 Heparin Dosing Weight: 66 kg  Vital Signs: Temp: 98.2 F (36.8 C) (12/25 1200) Temp Source: Oral (12/25 1200) BP: 82/36 (12/25 1200) Pulse Rate: 56 (12/25 1200)  Labs:  Recent Labs  01/08/16 1848 01/08/16 2304  01/09/16 0609  01/09/16 1626 01/10/16 0212 01/10/16 0337  HGB 11.9*  --   --  12.1  --  10.3*  --   HCT 36.6  --   --  37.3  --  31.6*  --   PLT 263  --   --  245  --  234  --   APTT 34  --   --   --   --   --   --   LABPROT 17.2*  --   --  16.8*  --  22.9*  --   INR 1.39  --   --  1.35  --  1.99  --   HEPARINUNFRC  --   --   < > <0.10* <0.10* 0.21* 0.23*  CREATININE 0.65  --   --  0.59  --  0.79  --   TROPONINI <0.03 <0.03  --  <0.03  --   --   --   < > = values in this interval not displayed.  Estimated Creatinine Clearance: 56.3 mL/min (by C-G formula based on SCr of 0.79 mg/dL).   Medications:  Last warfarin regimen (per [facility] clinic note on 11/10): 5 mg daily except 2.5 mg on Wednesday   Assessment: Patient is a 75 y.o F with hx pericardial effusion in in November 2017 (s/p Pericardiocentesis and pericardial drain) and afib on warfarin PTA.  Outpatient cardiology note on 12/19 indicated that warfarin was placed on hold with plan to resume "during the first week of January" d/t bloody pericardial effusion.  She presented to the ED in 12/23 with c/o fatigue, hallucination and  nausea.  Abd CT on 12/23 showed new renal splenic and right renal infarcts.  Heparin started and warfarin resumed on 12/23 for afib and infarcts.  Today, 01/10/2016: - Heparin level subtherapeutic (0.23) on 1450 units/hr - INR is essentially therapeutic at 1.99 and has quickly increase after two doses of warfarin 7.5mg , 5mg  - Hgb decreasing 10.3, Plts wnl - No active bleeding documented - Drug-drug intxns: flagyl, cefepime/vancomycin can increase INR  Goal of Therapy:  INR 2-3 Heparin level 0.3-0.7 units/ml Monitor platelets by anticoagulation protocol: Yes   Plan:   Increase heparin drip slightly to 1500 units/hr  No warfarin today given quick rise and interaction with flagyl  Monitor for s/s of bleeding  Daily heparin level, CBC, PT/INR  Peggyann Juba, PharmD, BCPS Pager: [phone] 01/10/2016,1:38 PM

## 2016-01-10 NOTE — Progress Notes (Signed)
Pt increased work of breathing and SOB, RR high 30's. Continued chest pain and anxiety. States chest pain feels like "intense pressure." Increased O2 to 4LNC, sat up into chair position on bed and paged MD. Received orders for arterial blood gas sample. Awaiting results. Will continue to monitor.

## 2016-01-10 NOTE — Progress Notes (Signed)
ANTICOAGULATION CONSULT NOTE - Follow Up Consult  Pharmacy Consult for Heparin Indication: hx atrial fibrillation; new renal and splenic infarcts  Allergies  Allergen Reactions  . Arthrotec [Diclofenac-Misoprostol] Diarrhea  . Erythromycin Anaphylaxis       . Morphine Sulfate Other (See Comments)    Reaction:  Hallucinations   . Amoxicillin Rash and Other (See Comments)    Has patient had a PCN reaction causing immediate rash, facial/tongue/throat swelling, SOB or lightheadedness with hypotension: Yes Has patient had a PCN reaction causing severe rash involving mucus membranes or skin necrosis: Yes Has patient had a PCN reaction that required hospitalization No Has patient had a PCN reaction occurring within the last 10 years: No If all of the above answers are "NO", then may proceed with Cephalosporin use.  . Diltiazem Hcl Swelling, Rash and Other (See Comments)    Pt is able to tolerate Verapamil.    . Gold-Containing Drug Products Rash  . Macrodantin [Nitrofurantoin Macrocrystal] Rash  . Naproxen Rash  . Penicillins Rash and Other (See Comments)    ++ tolerates cefepime and ceftriaxone ++ Has patient had a PCN reaction causing immediate rash, facial/tongue/throat swelling, SOB or lightheadedness with hypotension: Yes Has patient had a PCN reaction causing severe rash involving mucus membranes or skin necrosis: Yes Has patient had a PCN reaction that required hospitalization No Has patient had a PCN reaction occurring within the last 10 years: No If all of the above answers are "NO", then may proceed with Cephalosporin use.     Patient Measurements: Height: 5\' 3"  (160 cm) Weight: 145 lb 8.1 oz (66 kg) IBW/kg (Calculated) : 52.4 Heparin Dosing Weight:   Vital Signs: Temp: 98.2 F (36.8 C) (12/25 0400) Temp Source: Oral (12/25 0400) BP: 109/57 (12/25 0200) Pulse Rate: 56 (12/25 0200)  Labs:  Recent Labs  01/08/16 1848 01/08/16 2304  01/09/16 0609 01/09/16 1626  01/10/16 0212 01/10/16 0337  HGB 11.9*  --   --  12.1  --  10.3*  --   HCT 36.6  --   --  37.3  --  31.6*  --   PLT 263  --   --  245  --  234  --   APTT 34  --   --   --   --   --   --   LABPROT 17.2*  --   --  16.8*  --  22.9*  --   INR 1.39  --   --  1.35  --  1.99  --   HEPARINUNFRC  --   --   < > <0.10* <0.10* 0.21* 0.23*  CREATININE 0.65  --   --  0.59  --  0.79  --   TROPONINI <0.03 <0.03  --  <0.03  --   --   --   < > = values in this interval not displayed.  Estimated Creatinine Clearance: 56.3 mL/min (by C-G formula based on SCr of 0.79 mg/dL).   Medications:  Infusions:  . sodium chloride Stopped (01/10/16 0239)  . heparin 1,300 Units/hr (01/09/16 2000)    Assessment: Patient with low heparin level.  No heparin issues per RN.  Goal of Therapy:  Heparin level 0.3-0.7 units/ml Monitor platelets by anticoagulation protocol: Yes   Plan:  Increase heparin to 1350 units/hr Recheck level at [address], [place] 01/10/2016,4:56 AM

## 2016-01-11 LAB — BLOOD GAS, VENOUS
Acid-base deficit: 1.4 mmol/L (ref 0.0–2.0)
BICARBONATE: 22.8 mmol/L (ref 20.0–28.0)
DRAWN BY: 11249
O2 Content: 4 L/min
O2 Saturation: 58.7 %
PH VEN: 7.383 (ref 7.250–7.430)
Patient temperature: 99.5
pCO2, Ven: 39.4 mmHg — ABNORMAL LOW (ref 44.0–60.0)

## 2016-01-11 LAB — COMPREHENSIVE METABOLIC PANEL
ALK PHOS: 53 U/L (ref 38–126)
ALT: 22 U/L (ref 14–54)
ANION GAP: 4 — AB (ref 5–15)
AST: 26 U/L (ref 15–41)
Albumin: 2.7 g/dL — ABNORMAL LOW (ref 3.5–5.0)
BUN: 11 mg/dL (ref 6–20)
CALCIUM: 8.4 mg/dL — AB (ref 8.9–10.3)
CHLORIDE: 108 mmol/L (ref 101–111)
CO2: 25 mmol/L (ref 22–32)
Creatinine, Ser: 0.63 mg/dL (ref 0.44–1.00)
Glucose, Bld: 132 mg/dL — ABNORMAL HIGH (ref 65–99)
Potassium: 3.3 mmol/L — ABNORMAL LOW (ref 3.5–5.1)
SODIUM: 137 mmol/L (ref 135–145)
Total Bilirubin: 0.5 mg/dL (ref 0.3–1.2)
Total Protein: 5.7 g/dL — ABNORMAL LOW (ref 6.5–8.1)

## 2016-01-11 LAB — CBC
HCT: 31.1 % — ABNORMAL LOW (ref 36.0–46.0)
Hemoglobin: 10.3 g/dL — ABNORMAL LOW (ref 12.0–15.0)
MCH: 30.3 pg (ref 26.0–34.0)
MCHC: 33.1 g/dL (ref 30.0–36.0)
MCV: 91.5 fL (ref 78.0–100.0)
Platelets: 213 10*3/uL (ref 150–400)
RBC: 3.4 MIL/uL — ABNORMAL LOW (ref 3.87–5.11)
RDW: 16.3 % — AB (ref 11.5–15.5)
WBC: 10.2 10*3/uL (ref 4.0–10.5)

## 2016-01-11 LAB — PHOSPHORUS: PHOSPHORUS: 2.9 mg/dL (ref 2.5–4.6)

## 2016-01-11 LAB — PROTIME-INR
INR: 2.24
PROTHROMBIN TIME: 25.1 s — AB (ref 11.4–15.2)

## 2016-01-11 LAB — HEPARIN LEVEL (UNFRACTIONATED): HEPARIN UNFRACTIONATED: 0.36 [IU]/mL (ref 0.30–0.70)

## 2016-01-11 LAB — VANCOMYCIN, TROUGH: VANCOMYCIN TR: 11 ug/mL — AB (ref 15–20)

## 2016-01-11 LAB — MAGNESIUM: MAGNESIUM: 2 mg/dL (ref 1.7–2.4)

## 2016-01-11 MED ORDER — POTASSIUM CHLORIDE CRYS ER 20 MEQ PO TBCR
40.0000 meq | EXTENDED_RELEASE_TABLET | Freq: Once | ORAL | Status: AC
  Administered 2016-01-11: 40 meq via ORAL
  Filled 2016-01-11: qty 2

## 2016-01-11 MED ORDER — SODIUM CHLORIDE 0.9 % IV BOLUS (SEPSIS)
500.0000 mL | Freq: Once | INTRAVENOUS | Status: AC
  Administered 2016-01-11: 500 mL via INTRAVENOUS

## 2016-01-11 MED ORDER — WARFARIN SODIUM 2 MG PO TABS
2.0000 mg | ORAL_TABLET | Freq: Once | ORAL | Status: AC
  Administered 2016-01-11: 2 mg via ORAL
  Filled 2016-01-11: qty 1

## 2016-01-11 MED ORDER — VANCOMYCIN HCL IN DEXTROSE 1-5 GM/200ML-% IV SOLN
1000.0000 mg | Freq: Two times a day (BID) | INTRAVENOUS | Status: DC
  Administered 2016-01-11 – 2016-01-12 (×3): 1000 mg via INTRAVENOUS
  Filled 2016-01-11 (×3): qty 200

## 2016-01-11 MED ORDER — FUROSEMIDE 10 MG/ML IJ SOLN
20.0000 mg | Freq: Once | INTRAMUSCULAR | Status: AC
  Administered 2016-01-11: 20 mg via INTRAVENOUS
  Filled 2016-01-11: qty 2

## 2016-01-11 MED ORDER — SIMETHICONE 80 MG PO CHEW
80.0000 mg | CHEWABLE_TABLET | Freq: Four times a day (QID) | ORAL | Status: DC | PRN
  Administered 2016-01-11 – 2016-01-13 (×4): 80 mg via ORAL
  Filled 2016-01-11 (×4): qty 1

## 2016-01-11 NOTE — Progress Notes (Addendum)
Pharmacy Antibiotic Note  [name] is a 75 y.o. female  with medical history significant of hypertension, hyperlipidemia, GERD, rheumatoid arthritis, pulmonary hypertension, atrial fibrillation.  This is the 3rd admission in Dec. Admitted on 01/08/2016 with sepsis.  Pharmacy has been consulted for vanc and cefepime dosing for sepsis of unclear etiology.  First doses given in ED.  WBC normalized, PCT not repeated (initial level 0.12), last fever 12/24, Tc 100.1, SCr stable.  Plan: Continue Cefepime 1gm IV q8h. Vancomycin 750mg  IV q12h. Check trough level this afternoon.  ADDENDUM: 01/11/2016 3:16 PM Vanc trough subtherapeutic at 11 mcg/ml Plan: Increase vanc to 1g IV q12h.  Height: 5\' 3"  (160 cm) Weight: 145 lb 8.1 oz (66 kg) IBW/kg (Calculated) : 52.4  Temp (24hrs), Avg:98.8 F (37.1 C), Min:97.6 F (36.4 C), Max:100.1 F (37.8 C)   Recent Labs Lab 01/08/16 1257 01/08/16 1349 01/08/16 1628 01/08/16 1833 01/08/16 1848 01/09/16 0609 01/10/16 0212 01/11/16 0335  WBC 18.8*  --   --   --  16.2* 12.7* 12.2* 10.2  CREATININE 0.81  --   --   --  0.65 0.59 0.79 0.63  LATICACIDVEN  --  4.83* 1.48 1.3  --   --   --   --     Estimated Creatinine Clearance: 56.3 mL/min (by C-G formula based on SCr of 0.63 mg/dL).    Allergies  Allergen Reactions  . Arthrotec [Diclofenac-Misoprostol] Diarrhea  . Erythromycin Anaphylaxis       . Morphine Sulfate Other (See Comments)    Reaction:  Hallucinations   . Amoxicillin Rash and Other (See Comments)    Has patient had a PCN reaction causing immediate rash, facial/tongue/throat swelling, SOB or lightheadedness with hypotension: Yes Has patient had a PCN reaction causing severe rash involving mucus membranes or skin necrosis: Yes Has patient had a PCN reaction that required hospitalization No Has patient had a PCN reaction occurring within the last 10 years: No If all of the above answers are "NO", then may proceed with  Cephalosporin use.  . Diltiazem Hcl Swelling, Rash and Other (See Comments)    Pt is able to tolerate Verapamil.    . Gold-Containing Drug Products Rash  . Macrodantin [Nitrofurantoin Macrocrystal] Rash  . Naproxen Rash  . Penicillins Rash and Other (See Comments)    ++ tolerates cefepime and ceftriaxone ++ Has patient had a PCN reaction causing immediate rash, facial/tongue/throat swelling, SOB or lightheadedness with hypotension: Yes Has patient had a PCN reaction causing severe rash involving mucus membranes or skin necrosis: Yes Has patient had a PCN reaction that required hospitalization No Has patient had a PCN reaction occurring within the last 10 years: No If all of the above answers are "NO", then may proceed with Cephalosporin use.     Antimicrobials this admission:  12/23 cefepime >> 12/23 vancomycin >> PO Flagyl continued from PTA for C.diff  Dose adjustments this admission:  12/26 1400 VT: ___ on 750 mg q12h  Microbiology results:  12/12 cdiff PCR: positive  12/23 at 1500 BCx x2:  ngtd 12/23 at [place]: ngtd 12/23 at 2304 bcx: ngtd 12/23 UCx: multiple species 12/24 GI panel: neg   Thank you for allowing pharmacy to be a part of this patient's care.  Hershal Coria, PharmD, BCPS Pager: [phone] 01/11/2016 10:07 AM

## 2016-01-11 NOTE — Progress Notes (Signed)
ANTICOAGULATION CONSULT NOTE - Follow Up Consult  Pharmacy Consult for heparin and warfarin Indication: hx atrial fibrillation; new renal and splenic infarcts  Allergies  Allergen Reactions  . Arthrotec [Diclofenac-Misoprostol] Diarrhea  . Erythromycin Anaphylaxis       . Morphine Sulfate Other (See Comments)    Reaction:  Hallucinations   . Amoxicillin Rash and Other (See Comments)    Has patient had a PCN reaction causing immediate rash, facial/tongue/throat swelling, SOB or lightheadedness with hypotension: Yes Has patient had a PCN reaction causing severe rash involving mucus membranes or skin necrosis: Yes Has patient had a PCN reaction that required hospitalization No Has patient had a PCN reaction occurring within the last 10 years: No If all of the above answers are "NO", then may proceed with Cephalosporin use.  . Diltiazem Hcl Swelling, Rash and Other (See Comments)    Pt is able to tolerate Verapamil.    . Gold-Containing Drug Products Rash  . Macrodantin [Nitrofurantoin Macrocrystal] Rash  . Naproxen Rash  . Penicillins Rash and Other (See Comments)    ++ tolerates cefepime and ceftriaxone ++ Has patient had a PCN reaction causing immediate rash, facial/tongue/throat swelling, SOB or lightheadedness with hypotension: Yes Has patient had a PCN reaction causing severe rash involving mucus membranes or skin necrosis: Yes Has patient had a PCN reaction that required hospitalization No Has patient had a PCN reaction occurring within the last 10 years: No If all of the above answers are "NO", then may proceed with Cephalosporin use.     Patient Measurements: Height: 5\' 3"  (160 cm) Weight: 145 lb 8.1 oz (66 kg) IBW/kg (Calculated) : 52.4 Heparin Dosing Weight: 66 kg  Vital Signs: Temp: 100.1 F (37.8 C) (12/26 0800) Temp Source: Oral (12/26 0800) BP: 107/46 (12/26 0700) Pulse Rate: 87 (12/26 0700)  Labs:  Recent Labs  01/08/16 1848 01/08/16 2304 01/09/16 0609   01/10/16 0212 01/10/16 0337 01/10/16 1310 01/11/16 0335  HGB 11.9*  --  12.1  --  10.3*  --   --  10.3*  HCT 36.6  --  37.3  --  31.6*  --   --  31.1*  PLT 263  --  245  --  234  --   --  213  APTT 34  --   --   --   --   --   --   --   LABPROT 17.2*  --  16.8*  --  22.9*  --   --  25.1*  INR 1.39  --  1.35  --  1.99  --   --  2.24  HEPARINUNFRC  --   --  <0.10*  < > 0.21* 0.23* 0.24* 0.36  CREATININE 0.65  --  0.59  --  0.79  --   --  0.63  TROPONINI <0.03 <0.03 <0.03  --   --   --   --   --   < > = values in this interval not displayed.  Estimated Creatinine Clearance: 56.3 mL/min (by C-G formula based on SCr of 0.63 mg/dL).   Medications:  Last warfarin regimen (per [facility] clinic note on 11/10): 5 mg daily except 2.5 mg on Wednesday   Assessment: Patient is a 75 y.o F with hx pericardial effusion in in November 2017 (s/p Pericardiocentesis and pericardial drain) and afib on warfarin PTA.  Outpatient cardiology note on 12/19 indicated that warfarin was placed on hold with plan to resume "during the first week of January" d/t bloody  pericardial effusion.  She presented to the ED in 12/23 with c/o fatigue, hallucination and nausea.  Abd CT on 12/23 showed new renal splenic and right renal infarcts.  Heparin started and warfarin resumed on 12/23 for afib and infarcts.    Today, 01/11/2016: - Heparin level therapeutic (0.36) on 1500 units/hr - INR therapeutic at 2.24 and has quickly increased after two doses of warfarin 7.5mg , 5mg .  Warfarin held yesterday due to quick increase in INR. - Hgb stable at 10.3 today, Plts wnl.  12/24 ECHO without pericardial effusion. - Drug-drug intxns: flagyl, cefepime/vancomycin can increase INR  Goal of Therapy:  INR 2-3 Heparin level 0.3-0.7 units/ml Monitor platelets by anticoagulation protocol: Yes   Plan:   Continue heparin drip at 1500 units/hr.  Will continue heparin infusion overlap with warfarin for 5 days minimum due to new  infarcts.  Resume warfarin dose today with small dose of 2 mg given quick rise and interaction with Flagyl.  Monitor for s/s of bleeding  Daily heparin level, CBC, PT/INR  Hershal Coria, PharmD, BCPS Pager: [phone] 01/11/2016 9:53 AM

## 2016-01-11 NOTE — Progress Notes (Addendum)
PROGRESS NOTE    [name]  [id number] DOB: 02/07/1940 DOA: 01/08/2016 PCP: Leeroy Cha, MD   Brief Narrative:  [name] is a 75 y.o. 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 Patient was discharged home and did well the first few days but then states lost her appetite and started coughing and had worsening diarrhea over the course of the week. Patient states she became more fatigued and developed fevers and did not want to come back to the ER at the urge of her daughter. She Admitted to Nausea but no vomiting and states she has chest wall pain and tenderness from coughing. Patient thinks liquidly diarrhea is getting worse and has had countless episodes to the point where she defecates on herself. No blood in stool noted. Has had fevers and chills at home and has worsened since discharge last Sunday. Per daughter has been so weak and has only lying in bed and not been eating. Saw her Cardiologist 4 days ago and states that when she started feeling worse. States feels very similar to last hospitalization. No other concerns or complaints at this time. Hospitalist was called to Admit because of Sepsis.  Assessment & Plan:   Active Problems:   Atrial fibrillation (HCC)   Hypertension   Hyperlipidemia   Chest pain   QT prolongation   Rheumatoid arthritis (HCC)   Pleuritic chest pain   Persistent atrial fibrillation (HCC)   Pulmonary hypertension   Sepsis (HCC)   Diarrhea   Chronic systolic CHF (congestive heart failure) ([place])   Immunosuppressed status ([place])  Sepsis of Unclear Etiology possible from Health Care Acquired Pneumonia vs. Gastrointestinal Illness r/o C Diff -Patient is  Septic on Admission with Fever of 102, Tachycardia, Tachypnea, Leukocytosis of 18.8 and Lactic Acid Level of 4.83. Etiology is not clear at this point and CT of Chest/Abd/Pelvis Pending.  -Possibly from Pneumonia vs. C. Difficile Colitis? -Sepsis Bundle Protocol -Check Procalcitonin and Repeat Lactic Acid Level per Protocol - Repeat Lactic Acid was 1.48 -Check C. Difficile PCR Toxin and Antigen; Pending -Blood Cx's x2 showed No growth at 3 days, Sputum Cx; Urinalysis Negative  -Urine Cx Showed Multiple Species Present -Normal Saline (30 mL/kg) initial Bolus and then 100 mL/hr; IVF Now D/C'd -Chest and Abdominal Imaging X-Ray showed Cardiomegaly with possible mild perihilar edema. Small bilateral pleural effusions.Mild left lower lobe opacity, likely atelectasis. No evidence of small bowel obstruction or free air. -WBC went from 18.8 -> 12.2 -> 10.2 -C/w IV Vancomycin/IV Cefepime/ and po Metronidazole -Follow blood culture, urine culture and sputum Cx -CT of Chest/Abd/Pelvis Not reveling infection -?Embolic Fever; May consult Infectious Diseases in AM for discussion and evaluation.   Diarrhea r/o C. 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  -Stomach/Bowel: Normal remainder of the stomach. Colonic stool burden suggests constipation. Normal small bowel. -C Diff PCR ordered; Patient stopped having diarrhea -GI Stool Panel Negative -C/w Home Metronidazole 500 mg po q8h and Acidophilus; ? Discontinuation -Diet Advanced; Added Simethicone Chewable Tabs for Gas Pain  Hypotension -Bolused 2 500 mL bags -Continue to Monitor  Cough with Non-Productive Sputum r/o HCAP -CT of Chest Showed Multifactorial Degradation and Small Bilateral Pleural Effusions with Bibasilar Atelectasis -C/w Xopenex q6hprn -  IV Vanc and Cefepime per Pharmacy Dosing  Visual Hallucinations -Head CT Negative and showed No acute intracranial abnormality.  Stable mild brain parenchymal atrophy and chronic microvascular disease. -Avoid Sedatives  Hypokalemia -Patient's K+ was 3.3 this AM down from 4.0; (likely from diuresis) -Replete -Repeat CMP in AM  Mild Hyponatremia -Improved. -Patients Na+ was 137 -Repeat CMP in AM  Atypical CP r/o ACS -Troponin I x 3 Negative -Repeat EKG showed qTC of 510; No ST Elevation or Depression but did show Atrial Fibrillation with RVR  Prolonged qTC -EKG this Admission was 550 -Check EKG in AM -Avoid Zofran, Phenergan, Ranitidine; Started Compazine -Monitor SDU with Telemetry  Generalized Weakness and Deconditioning -PT/OT Evaluation -C/w IVF  -Nutritionist Consult  Pericardial Effusion:  -S/p of pericardiocentesis and Pericardial drain placement with removal of 500 mL of Dark Sanguinous Fluid during the Admission of 11/29-12/3. -She underwent repeatedechocardiograms without significant fluid accumulation before discharge. Had Echo Done prior to Last Discharge. -Did not want Colchicine for Pericarditis colchicine -CT of the Chest showed Cardiomegaly with slight increase in small pericardial effusion. -ECHOCardiogram did not show Pericardial Effusion.  Atrial Fibrillation with RVR:  -Recently Saw Dr. Irish Lack on 01/04/16 -CHA2DS2-VASc Scoreis 3 - Was on Oral Anticoagulation with Coumadin *currently held due to Bloody Pericardial Effusion -Coumadin was held because of Bloody Pericardial Effusion; Per notes was to be started During the First Week of January; However given CT ABD/PELVIS FINDINGS RESTARTED HEPARIN gtt and Warfarain- INR Now Therapeutic will likely D/C Heparin gtt.  -Restarted Metoprolol and Verapamil  -C/w Telemetry Monitoring -ECHO as below -Cardiology Consulted. Appreciate Dr. Haroldine Laws Recc's - recommends continuing current management and continue with po Metoprolol and Verapamil. C/w Heparin/Warfarin. *will likley D/C Heparin gtt now that INR is therapeutic at  2.24 -Possible IV Amiodarone vs IV Lopressor per Cards.   Rheumatoid Arthritis:  -On prednisone 2.5 mg daily and continued,  -Hold Home Methotrexate and Etenercept. Clinically stable. -Check Cortisol level -May Need Stress Dose Steroids  HLD: -Last LDL was79 on 05/21/13 -Continue home medications:Pravastatin   GERD: -Avoid PPI and H2 Blocker in the Setting of Possible C.Diff and qTC prolongation  Acute on Chronic systolic congestive heart failure: -Transthoracic ECHO on 12/29/15 showed EF of 50-55 %. However previous [facility] showed EF of 40-45%.  -BNP was 302.0 -Strict I's and O's and Daily Weight -Given IV Lasix once yesterday AM and Cardiology Diuresing again. -Repeat CMP in AM  DVT prophylaxis: Heparin gtt and Warfarin Code Status: FULL CODE Family Communication: No Family present at bedside Disposition Plan: Remain in SDU today as BP were on soft side  Consultants:   None  Procedures: ECHOcardiogram  Antimicrobials: IV Cefepime, po Metronidazole, and IV Vancomycin  Subjective: Seen and examined at bedside and states she felt weak but seemed better. Had some gas pains. No Nausea. States Heat pack helps her arm where IV infiltrated. No other concerns or complaints at this time.   Objective: Vitals:   01/11/16 0300 01/11/16 0400 01/11/16 0500 01/11/16 0600  BP: (!) 86/37 (!) 105/51 (!) 104/58 113/74  Pulse: 72 78 73 96  Resp: 20 (!) 26 19 (!) 25  Temp:  97.6 F (36.4 C)    TempSrc:  Oral    SpO2: 99% (!) 89% 100% 98%  Weight:      Height:        Intake/Output Summary (Last 24 hours) at 01/11/16 0727 Last data filed at 01/11/16 0600  Gross per 24 hour  Intake  2086.95 ml  Output              800 ml  Net          1286.95 ml   Filed Weights   01/08/16 1800  Weight: 66 kg (145 lb 8.1 oz)    Examination: Physical Exam:  Constitutional: Caucasian female in no acute distress  Eyes: Lids and conjunctivae normal, sclerae anicteric  ENMT:  External Ears, Nose appear normal. Grossly normal hearing.  Neck: Appears normal, supple, no cervical masses, normal ROM, no appreciable thyromegaly, no JVD.  Respiratory: Diminished to auscultation bilaterally, No wheezing, rales, rhonchi. Mild crackles. Slightly increased respiratory effort and patient is Slightly tachypenic. No accessory muscle use.  Cardiovascular: Irregularly Irregular, no murmurs / rubs / gallops. S1 and S2 auscultated. No extremity edema. Abdomen: Soft, Nontender on palpation, non-distended. No masses palpated. No appreciable hepatosplenomegaly. Bowel sounds positive x4.  GU: Deferred. Musculoskeletal: No clubbing / cyanosis of digits/nails. No joint deformity upper and lower extremities. Right Arm less swollen.  Skin: No rashes, lesions, ulcers on limited skin evaluation. No induration; Warm and dry.  Neurologic: CN 2-12 grossly intact with no focal deficits. Sensation intact in all 4 Extremities. Romberg sign cerebellar reflexes not assessed.  Psychiatric: Normal judgment and insight. Alert and oriented x 3. Depressed mood and appropriate affect  Data Reviewed: I have personally reviewed following labs and imaging studies  CBC:  Recent Labs Lab 01/08/16 1257 01/08/16 1848 01/09/16 0609 01/10/16 0212 01/11/16 0335  WBC 18.8* 16.2* 12.7* 12.2* 10.2  NEUTROABS  --  14.6*  --  10.7*  --   HGB 13.7 11.9* 12.1 10.3* 10.3*  HCT 42.2 36.6 37.3 31.6* 31.1*  MCV 91.5 91.3 91.2 91.3 91.5  PLT 302 263 245 234 [id number]   Basic Metabolic Panel:  Recent Labs Lab 01/08/16 1257 01/08/16 1848 01/09/16 0609 01/10/16 0212 01/11/16 0335  NA 134* 136 135 133* 137  K 3.8 3.9 3.9 4.0 3.3*  CL 98* 105 107 104 108  CO2 24 24 21* 23 25  GLUCOSE 141* 112* 112* 136* 132*  BUN 11 8 6 10 11   CREATININE 0.81 0.65 0.59 0.79 0.63  CALCIUM 8.9 7.6* 8.1* 8.3* 8.4*  MG  --   --   --  1.8 2.0  PHOS  --   --   --  2.7 2.9   GFR: Estimated Creatinine Clearance: 56.3 mL/min (by C-G  formula based on SCr of 0.63 mg/dL). Liver Function Tests:  Recent Labs Lab 01/08/16 1257 01/08/16 1848 01/09/16 0609 01/10/16 0212 01/11/16 0335  AST 29 19 42* 28 26  ALT 25 17 28 24 22   ALKPHOS 62 50 55 53 53  BILITOT 0.9 0.9 1.0 0.5 0.5  PROT 7.3 5.8* 6.0* 5.7* 5.7*  ALBUMIN 3.7 2.9* 3.0* 2.8* 2.7*   No results for input(s): LIPASE, AMYLASE in the last 168 hours. No results for input(s): AMMONIA in the last 168 hours. Coagulation Profile:  Recent Labs Lab 01/08/16 1257 01/08/16 1848 01/09/16 0609 01/10/16 0212 01/11/16 0335  INR 1.27 1.39 1.35 1.99 2.24   Cardiac Enzymes:  Recent Labs Lab 01/08/16 1848 01/08/16 2304 01/09/16 0609  TROPONINI <0.03 <0.03 <0.03   BNP (last 3 results)  Recent Labs  12/13/15 1704  PROBNP 215.0*   HbA1C: No results for input(s): HGBA1C in the last 72 hours. CBG:  Recent Labs Lab 01/08/16 1513  GLUCAP 192*   Lipid Profile: No results for input(s): CHOL, HDL, LDLCALC, TRIG, CHOLHDL, LDLDIRECT in  the last 72 hours. Thyroid Function Tests:  Recent Labs  01/08/16 1848  TSH 1.999   Anemia Panel: No results for input(s): VITAMINB12, FOLATE, FERRITIN, TIBC, IRON, RETICCTPCT in the last 72 hours. Sepsis Labs:  Recent Labs Lab 01/08/16 1349 01/08/16 1628 01/08/16 1833 01/08/16 1848  PROCALCITON  --   --   --  0.12  LATICACIDVEN 4.83* 1.48 1.3  --     Recent Results (from the past 240 hour(s))  Blood culture (routine x 2)     Status: None (Preliminary result)   Collection Time: 01/08/16  2:14 PM  Result Value Ref Range Status   Specimen Description BLOOD LEFT FOREARM  Final   Special Requests IN PEDIATRIC BOTTLE 5CC  Final   Culture   Final    NO GROWTH 2 DAYS Performed at [facility]    Report Status PENDING  Incomplete  Urine culture     Status: None   Collection Time: 01/08/16  3:07 PM  Result Value Ref Range Status   Specimen Description URINE, CATHETERIZED  Final   Special Requests NONE   Final   Culture NO GROWTH Performed at [facility]   Final   Report Status 01/10/2016 FINAL  Final  Blood culture (routine x 2)     Status: None (Preliminary result)   Collection Time: 01/08/16  3:15 PM  Result Value Ref Range Status   Specimen Description BLOOD RIGHT ANTECUBITAL  Final   Special Requests BOTTLES DRAWN AEROBIC AND ANAEROBIC 5CC  Final   Culture   Final    NO GROWTH 2 DAYS Performed at [facility]    Report Status PENDING  Incomplete  Culture, blood (x 2)     Status: None (Preliminary result)   Collection Time: 01/08/16  6:48 PM  Result Value Ref Range Status   Specimen Description BLOOD LEFT ANTECUBITAL  Final   Special Requests IN PEDIATRIC BOTTLE [place]  Final   Culture   Final    NO GROWTH 1 DAY Performed at [facility]    Report Status PENDING  Incomplete  Urine culture     Status: Abnormal   Collection Time: 01/08/16  8:12 PM  Result Value Ref Range Status   Specimen Description URINE, RANDOM  Final   Special Requests NONE  Final   Culture MULTIPLE SPECIES PRESENT, SUGGEST RECOLLECTION (A)  Final   Report Status 01/10/2016 FINAL  Final  Culture, blood (x 2)     Status: None (Preliminary result)   Collection Time: 01/08/16 11:04 PM  Result Value Ref Range Status   Specimen Description BLOOD BLOOD LEFT FOREARM  Final   Special Requests IN PEDIATRIC BOTTLE [place]  Final   Culture   Final    NO GROWTH 1 DAY Performed at [facility]    Report Status PENDING  Incomplete  Gastrointestinal Panel by PCR , Stool     Status: None   Collection Time: 01/09/16  3:02 PM  Result Value Ref Range Status   Campylobacter species NOT DETECTED NOT DETECTED Final   Plesimonas shigelloides NOT DETECTED NOT DETECTED Final   Salmonella species NOT DETECTED NOT DETECTED Final   Yersinia enterocolitica NOT DETECTED NOT DETECTED Final   Vibrio species NOT DETECTED NOT DETECTED Final   Vibrio cholerae NOT DETECTED NOT DETECTED Final    Enteroaggregative E coli (EAEC) NOT DETECTED NOT DETECTED Final   Enteropathogenic E coli (EPEC) NOT DETECTED NOT DETECTED Final   Enterotoxigenic E coli (ETEC) NOT DETECTED  NOT DETECTED Final   Shiga like toxin producing E coli (STEC) NOT DETECTED NOT DETECTED Final   Shigella/Enteroinvasive E coli (EIEC) NOT DETECTED NOT DETECTED Final   Cryptosporidium NOT DETECTED NOT DETECTED Final   Cyclospora cayetanensis NOT DETECTED NOT DETECTED Final   Entamoeba histolytica NOT DETECTED NOT DETECTED Final   Giardia lamblia NOT DETECTED NOT DETECTED Final   Adenovirus F40/41 NOT DETECTED NOT DETECTED Final   Astrovirus NOT DETECTED NOT DETECTED Final   Norovirus GI/GII NOT DETECTED NOT DETECTED Final   Rotavirus A NOT DETECTED NOT DETECTED Final   Sapovirus (I, II, IV, and V) NOT DETECTED NOT DETECTED Final    Radiology Studies: Dg Chest 1 View  Result Date: 01/10/2016 CLINICAL DATA:  Cough. EXAM: CHEST 1 VIEW COMPARISON:  01/09/2016 FINDINGS: Enlargement of the cardiac silhouette is unchanged. Lung volumes are diminished, more so than on the prior study. There is peribronchial cuffing and diffuse interstitial accentuation which have increased from the prior study. Patchy opacities in both lung bases have also increased. Small pleural effusions persist. No pneumothorax is seen. No acute osseous abnormality is identified. IMPRESSION: 1. Cardiomegaly with likely mild interstitial edema and small pleural effusions. 2. Low lung volumes with increased bibasilar opacities, likely atelectasis. Electronically Signed   By: Logan Bores M.D.   On: 01/10/2016 06:42   ECHOCARDIOGRAM Study Conclusions  - Left ventricle: The cavity size was normal. There was mild   concentric hypertrophy. Systolic function was normal. The   estimated ejection fraction was in the range of 50% to 55%. Wall   motion was normal; there were no regional wall motion   abnormalities. The study was not technically sufficient to  allow   evaluation of LV diastolic dysfunction due to atrial   fibrillation. - Aortic valve: Trileaflet; normal thickness leaflets. There was no   regurgitation. - Aortic root: The aortic root was normal in size. - Ascending aorta: The ascending aorta was normal in size. - Mitral valve: Structurally normal valve. There was mild   regurgitation. - Left atrium: The atrium was mildly dilated. - Right ventricle: The cavity size was normal. Wall thickness was   normal. Systolic function was normal. - Right atrium: The atrium was normal in size. - Tricuspid valve: There was mild regurgitation. - Pulmonary arteries: Systolic pressure was mildly increased. PA   peak pressure: 40 mm Hg (S). - Inferior vena cava: The vessel was normal in size. - Pericardium, extracardiac: There was no pericardial effusion.  Scheduled Meds: . acidophilus  1 capsule Oral Daily  . ceFEPime (MAXIPIME) IV  1 g Intravenous Q8H  . chlorhexidine  15 mL Mouth Rinse BID  . cholecalciferol  2,000 Units Oral Daily  . folic acid  2 mg Oral Daily  . levalbuterol  0.63 mg Nebulization QID  . mouth rinse  15 mL Mouth Rinse q12n4p  . metoprolol succinate  100 mg Oral BID  . metroNIDAZOLE  500 mg Oral TID  . multivitamin with minerals  1 tablet Oral Daily  . pravastatin  20 mg Oral QHS  . predniSONE  2.5 mg Oral Q breakfast  . vancomycin  750 mg Intravenous Q12H  . verapamil  120 mg Oral Q12H  . Warfarin - Pharmacist Dosing Inpatient   Does not apply q1800   Continuous Infusions: . sodium chloride 10 mL/hr at 01/11/16 0149  . heparin 1,500 Units/hr (01/10/16 1415)    LOS: 3 days   Kerney Elbe, DO [facility] Pager [phone]  If  7PM-7AM, please contact night-coverage [email or web address] Password [facility] 01/11/2016, 7:27 AM

## 2016-01-11 NOTE — Progress Notes (Signed)
Enteric Precautions discontinued per Infectious Disease.

## 2016-01-11 NOTE — Progress Notes (Signed)
Patient Name: [name] Date of Encounter: 01/11/2016  Primary Cardiologist: Dr [facility] Problem List     Active Problems:   Atrial fibrillation [facility])   Hypertension   Hyperlipidemia   Chest pain   QT prolongation   Rheumatoid arthritis (HCC)   Pleuritic chest pain   Persistent atrial fibrillation (HCC)   Pulmonary hypertension   Sepsis ([place])   Diarrhea   Chronic systolic CHF (congestive heart failure) ([place])   Immunosuppressed status (HCC)     Subjective   SOB, but just had breathing treatment so a little better, but HR is up. Cough and CP are better. Has Abd pain and gas.  Inpatient Medications    Scheduled Meds: . acidophilus  1 capsule Oral Daily  . ceFEPime (MAXIPIME) IV  1 g Intravenous Q8H  . chlorhexidine  15 mL Mouth Rinse BID  . cholecalciferol  2,000 Units Oral Daily  . folic acid  2 mg Oral Daily  . levalbuterol  0.63 mg Nebulization QID  . mouth rinse  15 mL Mouth Rinse q12n4p  . metoprolol succinate  100 mg Oral BID  . metroNIDAZOLE  500 mg Oral TID  . multivitamin with minerals  1 tablet Oral Daily  . pravastatin  20 mg Oral QHS  . predniSONE  2.5 mg Oral Q breakfast  . vancomycin  750 mg Intravenous Q12H  . verapamil  120 mg Oral Q12H  . Warfarin - Pharmacist Dosing Inpatient   Does not apply q1800   Continuous Infusions: . sodium chloride 10 mL/hr at 01/11/16 0149  . heparin 1,500 Units/hr (01/10/16 1415)   PRN Meds: acetaminophen **OR** acetaminophen, alum & mag hydroxide-simeth, bisacodyl, fluticasone, guaiFENesin, levalbuterol, prochlorperazine, senna-docusate   Vital Signs    Vitals:   01/11/16 0400 01/11/16 0500 01/11/16 0600 01/11/16 0700  BP: (!) 105/51 (!) 104/58 113/74 (!) 107/46  Pulse: 78 73 96 87  Resp: (!) 26 19 (!) 25 (!) 23  Temp: 97.6 F (36.4 C)     TempSrc: Oral     SpO2: (!) 89% 100% 98% 98%  Weight:      Height:        Intake/Output Summary (Last 24 hours) at 01/11/16 0746 Last data filed  at 01/11/16 0600  Gross per 24 hour  Intake          2036.95 ml  Output              800 ml  Net          1236.95 ml   Filed Weights   01/08/16 1800  Weight: 145 lb 8.1 oz (66 kg)    Physical Exam    GEN: Well nourished, well developed, in no acute distress.  HEENT: Grossly normal.  Neck: Supple, JVD 9 cm, carotid bruits, or masses. Cardiac: Irreg R&R, no murmurs, rubs, or gallops. No clubbing, cyanosis, edema.  Radials/DP/PT 2+ and equal bilaterally.  Respiratory:  Respirations regular and slightly labored, decreased BS bases, R>L w/ rales GI: Soft, +tender, +distended, BS + x 4. MS: no deformity or atrophy. Skin: warm and dry, no rash. Neuro:  Strength and sensation are intact. Psych: AAOx3.  Normal affect.  Labs    CBC  Recent Labs  01/08/16 1848  01/10/16 0212 01/11/16 0335  WBC 16.2*  < > 12.2* 10.2  NEUTROABS 14.6*  --  10.7*  --   HGB 11.9*  < > 10.3* 10.3*  HCT 36.6  < > 31.6* 31.1*  MCV 91.3  < >  91.3 91.5  PLT 263  < > 234 213  < > = values in this interval not displayed. Basic Metabolic Panel  Recent Labs  01/10/16 0212 01/11/16 0335  NA 133* 137  K 4.0 3.3*  CL 104 108  CO2 23 25  GLUCOSE 136* 132*  BUN 10 11  CREATININE 0.79 0.63  CALCIUM 8.3* 8.4*  MG 1.8 2.0  PHOS 2.7 2.9   Liver Function Tests  Recent Labs  01/10/16 0212 01/11/16 0335  AST 28 26  ALT 24 22  ALKPHOS 53 53  BILITOT 0.5 0.5  PROT 5.7* 5.7*  ALBUMIN 2.8* 2.7*   Cardiac Enzymes  Recent Labs  01/08/16 1848 01/08/16 2304 01/09/16 0609  TROPONINI <0.03 <0.03 <0.03   Thyroid Function Tests  Recent Labs  01/08/16 1848  TSH 1.999    Telemetry    Atrial fib, RVR at times, no sig bradycardia or pauses - Personally Reviewed  ECG    N/a - Personally Reviewed  Radiology    Dg Chest 1 View Result Date: 01/10/2016 CLINICAL DATA:  Cough. EXAM: CHEST 1 VIEW COMPARISON:  01/09/2016 FINDINGS: Enlargement of the cardiac silhouette is unchanged. Lung volumes  are diminished, more so than on the prior study. There is peribronchial cuffing and diffuse interstitial accentuation which have increased from the prior study. Patchy opacities in both lung bases have also increased. Small pleural effusions persist. No pneumothorax is seen. No acute osseous abnormality is identified. IMPRESSION: 1. Cardiomegaly with likely mild interstitial edema and small pleural effusions. 2. Low lung volumes with increased bibasilar opacities, likely atelectasis. Electronically Signed   By: Logan Bores M.D.   On: 01/10/2016 06:42    Cardiac Studies   ECHO: 12/24 - Left ventricle: The cavity size was normal. There was mild   concentric hypertrophy. Systolic function was normal. The   estimated ejection fraction was in the range of 50% to 55%. Wall   motion was normal; there were no regional wall motion   abnormalities. The study was not technically sufficient to allow   evaluation of LV diastolic dysfunction due to atrial fibrillation. - Aortic valve: Trileaflet; normal thickness leaflets. There was no   regurgitation. - Aortic root: The aortic root was normal in size. - Ascending aorta: The ascending aorta was normal in size. - Mitral valve: Structurally normal valve. There was mild regurgitation. - Left atrium: The atrium was mildly dilated. - Right ventricle: The cavity size was normal. Wall thickness was   normal. Systolic function was normal. - Right atrium: The atrium was normal in size. - Tricuspid valve: There was mild regurgitation. - Pulmonary arteries: Systolic pressure was mildly increased. PA   peak pressure: 40 mm Hg (S). - Inferior vena cava: The vessel was normal in size. - Pericardium, extracardiac: There was no pericardial effusion.   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 Admit 12/11-12/17 SIRS & C diff, on metoprolol 100 BID and Verapamil 120 BID for rate control. Pt to resume coumadin 01/17/2016.  Admitted 12/23 w/ AMS, fevers, diarrhea, cough-non productive, sepsis. Cards consulted for renal and splenic infarcts seen on CT. Now on heparin>>coumadin.  Assessment & Plan    1. A fib with RVR:  - rate generally < 100, on home metoprolol and verapamil  - If HR  increases could use IV amiodarone- but monitor QTC.   - Other option if BP allows is IV lopressor.  2. Pericardial Effusion- with bloody aspirant 12/16/2015, coumadin was on hold.  -  echo 12/24 without effusion- now on IV heparin and coumadin.  3. Anticoagulation:  - now on IV heparin and coumadin  4. 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 Acute on Chronic diastolic CHF:  - need daily weights - volume up by exam -  I/O +5.8 L since admit  - edema and small effusions on CXR yesterday. - will order Lasix 20 mg x 1 (had a dose yesterday) - give Kdur 40 meq today  6. Sepsis and other issues, per IM   Signed, Rosaria Ferries, PA-C  01/11/2016, 7:46 AM   Attending Note:   The patient was seen and examined.  Agree with assessment and plan as noted above.  Changes made to the above note as needed.  Patient seen and independently examined with Rosaria Ferries, PA .   We discussed all aspects of the encounter. I agree with the assessment and plan as stated above.  1. Atrial fib:   Complicated case.  She had a hemorrhagic pericardial effusion about a month ago but now has had a splenic embolus. Back on heparin and coumadin    I have spent a total of 40 minutes with patient reviewing hospital  notes , telemetry, EKGs, labs and examining patient as well as establishing an assessment and plan that was discussed with the patient. > 50% of time was spent in direct patient care.    Thayer Headings, Brooke Bonito., MD, [facility] 01/11/2016,  12:48 PM [address],  [place] Pager [phone]

## 2016-01-11 NOTE — Progress Notes (Signed)
Notified MD of BP of 71/35. New orders given. Will continue to monitor.

## 2016-01-12 DIAGNOSIS — I4891 Unspecified atrial fibrillation: Secondary | ICD-10-CM

## 2016-01-12 LAB — HEPARIN LEVEL (UNFRACTIONATED): HEPARIN UNFRACTIONATED: 0.13 [IU]/mL — AB (ref 0.30–0.70)

## 2016-01-12 LAB — CBC
HCT: 30.8 % — ABNORMAL LOW (ref 36.0–46.0)
HEMOGLOBIN: 10 g/dL — AB (ref 12.0–15.0)
MCH: 29.7 pg (ref 26.0–34.0)
MCHC: 32.5 g/dL (ref 30.0–36.0)
MCV: 91.4 fL (ref 78.0–100.0)
Platelets: 234 10*3/uL (ref 150–400)
RBC: 3.37 MIL/uL — AB (ref 3.87–5.11)
RDW: 16.2 % — ABNORMAL HIGH (ref 11.5–15.5)
WBC: 8.5 10*3/uL (ref 4.0–10.5)

## 2016-01-12 LAB — BASIC METABOLIC PANEL
ANION GAP: 4 — AB (ref 5–15)
BUN: 11 mg/dL (ref 6–20)
CHLORIDE: 109 mmol/L (ref 101–111)
CO2: 24 mmol/L (ref 22–32)
Calcium: 8.1 mg/dL — ABNORMAL LOW (ref 8.9–10.3)
Creatinine, Ser: 0.56 mg/dL (ref 0.44–1.00)
GFR calc Af Amer: >60 mL/min (ref >60)
GLUCOSE: 93 mg/dL (ref 65–99)
POTASSIUM: 3.3 mmol/L — AB (ref 3.5–5.1)
Sodium: 137 mmol/L (ref 135–145)

## 2016-01-12 LAB — PHOSPHORUS: Phosphorus: 3 mg/dL (ref 2.5–4.6)

## 2016-01-12 LAB — PROTIME-INR
INR: 2.72
PROTHROMBIN TIME: 29.4 s — AB (ref 11.4–15.2)

## 2016-01-12 LAB — MAGNESIUM: Magnesium: 1.9 mg/dL (ref 1.7–2.4)

## 2016-01-12 MED ORDER — PREDNISONE 5 MG PO TABS
5.0000 mg | ORAL_TABLET | Freq: Every day | ORAL | Status: DC
  Administered 2016-01-13 – 2016-01-16 (×4): 5 mg via ORAL
  Filled 2016-01-12 (×5): qty 1

## 2016-01-12 MED ORDER — HEPARIN (PORCINE) IN NACL 100-0.45 UNIT/ML-% IJ SOLN
1600.0000 [IU]/h | INTRAMUSCULAR | Status: DC
  Filled 2016-01-12: qty 250

## 2016-01-12 MED ORDER — WARFARIN 0.5 MG HALF TABLET
0.5000 mg | ORAL_TABLET | Freq: Once | ORAL | Status: AC
Start: 2016-01-12 — End: 2016-01-12
  Administered 2016-01-12: 0.5 mg via ORAL
  Filled 2016-01-12: qty 1

## 2016-01-12 MED ORDER — PREDNISONE 5 MG PO TABS
5.0000 mg | ORAL_TABLET | Freq: Once | ORAL | Status: AC
  Administered 2016-01-12: 5 mg via ORAL
  Filled 2016-01-12: qty 1

## 2016-01-12 MED ORDER — FUROSEMIDE 10 MG/ML IJ SOLN
20.0000 mg | Freq: Once | INTRAMUSCULAR | Status: AC
  Administered 2016-01-12: 20 mg via INTRAVENOUS
  Filled 2016-01-12: qty 2

## 2016-01-12 MED ORDER — POTASSIUM CHLORIDE CRYS ER 20 MEQ PO TBCR
40.0000 meq | EXTENDED_RELEASE_TABLET | Freq: Two times a day (BID) | ORAL | Status: AC
  Administered 2016-01-12 (×2): 40 meq via ORAL
  Filled 2016-01-12 (×2): qty 2

## 2016-01-12 MED ORDER — LEVALBUTEROL HCL 0.63 MG/3ML IN NEBU
0.6300 mg | INHALATION_SOLUTION | Freq: Three times a day (TID) | RESPIRATORY_TRACT | Status: DC
  Administered 2016-01-12 – 2016-01-13 (×2): 0.63 mg via RESPIRATORY_TRACT
  Filled 2016-01-12 (×2): qty 3

## 2016-01-12 NOTE — Progress Notes (Signed)
Patient Name: [name] Date of Encounter: 01/12/2016  Primary Cardiologist: Dr [facility] Problem List     Active Problems:   Atrial fibrillation [facility])   Hypertension   Hyperlipidemia   Chest pain   QT prolongation   Rheumatoid arthritis (HCC)   Pleuritic chest pain   Persistent atrial fibrillation (HCC)   Pulmonary hypertension   Sepsis ([place])   Diarrhea   Chronic systolic CHF (congestive heart failure) ([place])   Immunosuppressed status ([place])     Subjective   Breathing a little better today, but still SOB w/ minimal activity (conversation) and on O2. Good UOP from the Lasix.   Inpatient Medications    Scheduled Meds: . acidophilus  1 capsule Oral Daily  . ceFEPime (MAXIPIME) IV  1 g Intravenous Q8H  . chlorhexidine  15 mL Mouth Rinse BID  . cholecalciferol  2,000 Units Oral Daily  . folic acid  2 mg Oral Daily  . levalbuterol  0.63 mg Nebulization QID  . mouth rinse  15 mL Mouth Rinse q12n4p  . metoprolol succinate  100 mg Oral BID  . metroNIDAZOLE  500 mg Oral TID  . multivitamin with minerals  1 tablet Oral Daily  . pravastatin  20 mg Oral QHS  . predniSONE  2.5 mg Oral Q breakfast  . vancomycin  1,000 mg Intravenous Q12H  . verapamil  120 mg Oral Q12H  . warfarin  0.5 mg Oral ONCE-1800  . Warfarin - Pharmacist Dosing Inpatient   Does not apply q1800   Continuous Infusions: . sodium chloride 10 mL/hr at 01/12/16 0400  . heparin     PRN Meds: acetaminophen **OR** acetaminophen, bisacodyl, fluticasone, guaiFENesin, levalbuterol, prochlorperazine, senna-docusate, simethicone   Vital Signs    Vitals:   01/12/16 0000 01/12/16 0333 01/12/16 0407 01/12/16 0408  BP: (!) 89/53  (!) 87/62   Pulse: 82  77   Resp: (!) 25  (!) 21   Temp:  98 F (36.7 C)    TempSrc:  Oral    SpO2: 100%  98%   Weight:    152 lb 5.4 oz (69.1 kg)  Height:        Intake/Output Summary (Last 24 hours) at 01/12/16 0738 Last data filed at 01/12/16 0400  Gross  per 24 hour  Intake             1050 ml  Output             2050 ml  Net            -1000 ml   Filed Weights   01/08/16 1800 01/12/16 0408  Weight: 145 lb 8.1 oz (66 kg) 152 lb 5.4 oz (69.1 kg)    Physical Exam    GEN: Well nourished, well developed, in no acute distress.  HEENT: Grossly normal.  Neck: Supple, JVD 9 cm, no carotid bruits, or masses. Cardiac: Irreg R&R, no murmurs, rubs, or gallops. No clubbing, cyanosis, edema.  Radials/DP/PT 2+ and equal bilaterally.  Respiratory:  Respirations regular and unlabored, decreased BS bases bilaterally w/ rales GI: Soft, nontender, nondistended, BS + x 4. MS: no deformity or atrophy. Skin: warm and dry, no rash. Neuro:  Strength and sensation are intact. Psych: AAOx3.  Normal affect.  Labs    CBC  Recent Labs  01/10/16 0212 01/11/16 0335 01/12/16 0401  WBC 12.2* 10.2 8.5  NEUTROABS 10.7*  --   --   HGB 10.3* 10.3* 10.0*  HCT 31.6* 31.1* 30.8*  MCV 91.3 91.5 91.4  PLT 234 213 234   Lab Results  Component Value Date   INR 2.72 01/12/2016   INR 2.24 01/11/2016   INR 1.99 123XX123   Basic Metabolic Panel  Recent Labs  01/11/16 0335 01/12/16 0401  NA 137 137  K 3.3* 3.3*  CL 108 109  CO2 25 24  GLUCOSE 132* 93  BUN 11 11  CREATININE 0.63 0.56  CALCIUM 8.4* 8.1*  MG 2.0 1.9  PHOS 2.9 3.0   Liver Function Tests  Recent Labs  01/10/16 0212 01/11/16 0335  AST 28 26  ALT 24 22  ALKPHOS 53 53  BILITOT 0.5 0.5  PROT 5.7* 5.7*  ALBUMIN 2.8* 2.7*   Telemetry    Atrial fib, RVR at times, occ PVCs, rare pairs - Personally Reviewed  ECG    n/a - Personally Reviewed  Radiology    No results found.  Cardiac Studies   ECHO: 12/24 - Left ventricle: The cavity size was normal. There was mild concentric hypertrophy. Systolic function was normal. The estimated ejection fraction was in the range of 50% to 55%. Wall motion was normal; there were no regional wall motion abnormalities. The study  was not technically sufficient to allow evaluation of LV diastolic dysfunction due to atrialfibrillation. - Aortic valve: Trileaflet; normal thickness leaflets. There was no regurgitation. - Aortic root: The aortic root was normal in size. - Ascending aorta: The ascending aorta was normal in size. - Mitral valve: Structurally normal valve. There was mildregurgitation. - Left atrium: The atrium was mildly dilated. - Right ventricle: The cavity size was normal. Wall thickness was normal. Systolic function was normal. - Right atrium: The atrium was normal in size. - Tricuspid valve: There was mild regurgitation. - Pulmonary arteries: Systolic pressure was mildly increased. PA peak pressure: 40 mm Hg (S). - Inferior vena cava: The vessel was normal in size. - Pericardium, extracardiac: There was no pericardial effusion.  Patient Profile     75 yo female w/ hx HTN, HLD, Ra, pHTN (PASP 48) w/ nl cors and EF by cath 2012, chronic Atrial Fibrillation, Pericardial Effusion s/p Pericardiocentesis and pericardial drain from recurrent Pneumonias 11/2015 so off coumadin. Admit 12/11-12/17 SIRS & C diff, on metoprolol 100 BID and Verapamil 120 BID for rate control. Pt to resume coumadin 01/17/2016.  Admitted 12/23 w/ AMS, fevers, diarrhea, cough-non productive, sepsis. Cards consulted for renal and splenic infarcts seen on CT. Now on heparin>>coumadin.   Assessment & Plan    1. A fib with RVR:  - rate generally < 100, on home metoprolol and verapamil  -If HR increases could use IV amiodarone- but monitor QTC.  - Other option if BP allows is IV lopressor.  2. Pericardial Effusion- with bloody aspirant 12/16/2015, coumadin was on hold.  - echo 12/24 without effusion- on IV heparin till coumadin was therapeutic, heparin now off.  3. Anticoagulation:  - coumadin per pharmacy  4. 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 Acute on Chronic diastolic  CHF:  - on daily weights, up 5 lbs since admit (after 2 doses Lasix) - volume up by exam -  I/O +4.8 L since admit  - edema and small effusions on CXR 12/25 - had Lasix 20 mg x 1 on 12/25 & 12/26  - will give again today - give Kdur 20 meq x 4 doses today  6. Sepsis and other issues, per IM   Signed, Rosaria Ferries, PA-C  01/12/2016,  7:38 AM   Attending Note:   The patient was seen and examined.  Agree with assessment and plan as noted above.  Changes made to the above note as needed.  Patient seen and independently examined with Rosaria Ferries, PA .   We discussed all aspects of the encounter. I agree with the assessment and plan as stated above.  1. Atrial fib:   Rate is fairly well controlled.  Continue coumadin , heparin to be DC'd today    2. Acute on chronic diastolic CHF:   Net positive through this admission Agree with daily lasix    I have spent a total of 40 minutes with patient reviewing hospital  notes , telemetry, EKGs, labs and examining patient as well as establishing an assessment and plan that was discussed with the patient. > 50% of time was spent in direct patient care.    Thayer Headings, Brooke Bonito., MD, [facility] 01/12/2016, 11:45 AM [address],  [place] Pager [phone]

## 2016-01-12 NOTE — Progress Notes (Addendum)
ANTICOAGULATION CONSULT NOTE - Follow Up Consult  Pharmacy Consult for heparin and warfarin Indication: hx atrial fibrillation; new renal and splenic infarcts  Patient Measurements: Height: 5\' 3"  (160 cm) Weight: 152 lb 5.4 oz (69.1 kg) IBW/kg (Calculated) : 52.4 Heparin Dosing Weight: 66 kg  Vital Signs: Temp: 98 F (36.7 C) (12/27 0333) Temp Source: Oral (12/27 0333) BP: 87/62 (12/27 0407) Pulse Rate: 77 (12/27 0407)  Labs:  Recent Labs  01/10/16 0212  01/10/16 1310 01/11/16 0335 01/12/16 0401  HGB 10.3*  --   --  10.3* 10.0*  HCT 31.6*  --   --  31.1* 30.8*  PLT 234  --   --  213 234  LABPROT 22.9*  --   --  25.1* 29.4*  INR 1.99  --   --  2.24 2.72  HEPARINUNFRC 0.21*  < > 0.24* 0.36 0.13*  CREATININE 0.79  --   --  0.63 0.56  < > = values in this interval not displayed.  Estimated Creatinine Clearance: 57.6 mL/min (by C-G formula based on SCr of 0.56 mg/dL).   Medications:  Last warfarin regimen (per [facility] clinic note on 11/10): 5 mg daily except 2.5 mg on Wednesday  Infusions: . sodium chloride 10 mL/hr at 01/12/16 0400  . heparin 1,500 Units/hr (01/12/16 0400)     Assessment: Patient is a 75 y.o F with hx pericardial effusion in in November 2017 (s/p Pericardiocentesis and pericardial drain) and afib on warfarin PTA.  Outpatient cardiology note on 12/19 indicated that warfarin was placed on hold with plan to resume "during the first week of January" d/t bloody pericardial effusion.  She presented to the ED in 12/23 with c/o fatigue, hallucination and nausea.  Abd CT on 12/23 showed new renal splenic and right renal infarcts.  Heparin started and warfarin resumed on 12/23 for afib and infarcts.    Today, 01/12/2016: - Heparin level 0.13, decreased to sub therapeutic on 1500 units/hr - INR therapeutic at 2.72 and quickly increased (warfarin 7.5mg , 5mg , held, 2mg ).  - Hgb stable at 10 today, Plts wnl.   - No bleeding or complications reported.  No IV heparin  interruptions.  Some bruising noted around IV sites. - 12/24 ECHO without pericardial effusion. - Drug-drug intxns: flagyl, cefepime/vancomycin can increase INR   Goal of Therapy:  INR 2-3 Heparin level 0.3-0.7 units/ml Monitor platelets by anticoagulation protocol: Yes   Plan:  Increase to heparin IV infusion at 1600 units/hr Heparin level 8 hours after rate change  Continue heparin infusion overlap with warfarin for 5 days minimum due to new infarcts.  Warfarin 0.5 mg today, low dose given quick INR rise and interaction with Flagyl.  Monitor for s/s of bleeding  Daily heparin level, CBC, PT/INR  Gretta Arab PharmD, BCPS Pager [phone] 01/12/2016 7:00 AM   Addendum:  Heparin drip d/c by MD on 12/27.  INR is therapeutic and has almost completed 5 day overlap (5th day of warfarin overlap to be given 12/27 at 1800). Heparin drip and labs d/c. Continue with Warfarin 0.5 mg PO tonight at 1800 Daily INR, CBC  Gretta Arab PharmD, BCPS Pager [phone] 01/12/2016 9:35 AM

## 2016-01-12 NOTE — Progress Notes (Signed)
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 Patient was discharged home and did well the first few days but then states lost her appetite and started coughing and had worsening diarrhea over the course of the week. Patient states she became more fatigued and developed fevers and did not want to come back to the ER at the urge of her daughter. She Admitted to Nausea but no vomiting and states she has chest wall pain and tenderness from coughing. Patient thinks liquidly diarrhea is getting worse and has had countless episodes to the point where she defecates on herself. No blood in stool noted. Has had fevers and chills at home and has worsened since discharge last Sunday. Per daughter has been so weak and has only lying in bed and not been eating. Saw her Cardiologist 4 days ago and states that when she started feeling worse. States feels very similar to last hospitalization. No other concerns or complaints at this time. Hospitalist was called to Admit because of Sepsis.  Assessment/Plan: Sepsis of Unclear Etiology possible from Health Care Acquired Pneumonia vs. Gastrointestinal Illness r/o C Diff -Patient is Septic on Admission with Fever of 102, Tachycardia, Tachypnea, Leukocytosis of 18.8 and Lactic Acid Level of 4.83. Likely HCAP vs viral infection -Sepsis Bundle Protocol -Check Procalcitonin and Repeat Lactic Acid Level per Protocol - Repeat Lactic Acid was 1.48 -Check C. Difficile PCR neg at last admit but treated empirically with metronidazole -Blood Cx's x2 showed No  growth at 3 days, Sputum Cx; Urinalysis Negative  -Urine Cx Showed Multiple Species Present -Normal Saline (30 mL/kg) initial Bolus and then 100 mL/hr; IVF Now D/C'd -Chest and Abdominal Imaging X-Ray showed Cardiomegaly with possible mild perihilar edema. Small bilateral pleural effusions.Mild left lower lobe opacity, likely atelectasis. No evidence of small bowel obstruction or free air. -WBC went from 18.8 -> 12.2 -> 8.5 -C/w IV Vancomycin/IV Cefepime/ and po Metronidazole--plan 24 hours more vanc and cefepime -Follow blood culture, urine culture--neg -CT of Chest/Abd/Pelvis Not reveling infection -fever partly attributable to new splenic and renal infarc  Diarrhea r/o C. 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  -Stomach/Bowel: Normal remainder of the stomach. Colonic stool burden suggests constipation. Normal small bowel. -C Diff PCR ordered; Patient stopped having diarrhea -GI Stool Panel Negative -C/w Home Metronidazole 500 mg po q8h and Acidophilus; plan to stop in 24-48 hrs -Diet Advanced; Added Simethicone Chewable Tabs for Gas Pain  Hypotension -Bolused  -Continue to Monitor -became fluid overloaded-->lasix   Acute on Chronic systolic congestive heart failure: -ECHOon 12/29/15 showed EF of 50-55 %. However previous [facility] showed EF of 40-45%.  -BNP was 302.0 -Strict I's and O's and Daily Weight--was +5.7L -Given IV Lasix 12/25 and 12/26  Cough with Non-Productive Sputum r/o HCAP -CT of Chest Showed Multifactorial Degradation and Small Bilateral Pleural Effusions with Bibasilar Atelectasis -C/w Xopenex q6hprn -IV Vanc and Cefepime per Pharmacy Dosing  Visual Hallucinations -Head CT Negative and showed No acute intracranial abnormality. Stable mild brain parenchymal atrophy and chronic microvascular disease. -Avoid Sedatives  Hypokalemia -Replete -Repeat BMP  in AM -check mag  Mild  Hyponatremia -Improved.  Atypical CP r/o ACS -Troponin I x 3 Negative -Repeat EKG showed qTC of 510; No ST Elevation or Depression but did show Atrial Fibrillation with RVR  Prolonged qTC -EKG this Admission was 550 -Avoid Zofran, Phenergan, Ranitidine; Started Compazine -Monitor SDU with Telemetry  Generalized Weakness and Deconditioning -PT/OT Evaluation -C/w IVF  -Nutritionist Consult  Pericardial Effusion:  -S/p of pericardiocentesis and Pericardial drain placement with removal of 500 mL of Dark Sanguinous Fluid during the Admission of 11/29-12/3. -She underwent repeatedechocardiograms without significant fluid accumulation before discharge. -Did not want Colchicine for Pericarditis colchicine -CT of the Chest showed Cardiomegaly with slight increase in small pericardial effusion. -01/09/16--ECHOCardiogram did not show Pericardial Effusion.  Atrial Fibrillation with RVR:  -Recently Saw Dr. Irish Lack on 01/04/16 -CHA2DS2-VASc Scoreis 3- Was on Oral Anticoagulation with Coumadin *currently held due to Bloody Pericardial Effusion -Coumadin was held because of Bloody Pericardial Effusion; Per notes was to be started During the First Week of January; However given CT ABD/PELVIS FINDINGS RESTARTED HEPARIN gtt and Warfarain-  -INR Now Therapeutic  -D/C Heparin gtt.  -Restarted Metoprolol and Verapamil  -C/w Telemetry Monitoring -ECHO as below -Cardiology Consulted. Appreciate Dr. Haroldine Laws Recc's - recommends continuing current management and continue with po Metoprolol and Verapamil.  -Possible IV Amiodarone vs IV Lopressor per Cards.   Rheumatoid Arthritis:  -On prednisone 2.5 mg daily and continued,  -Hold Home Methotrexate and Etenercept. Clinically stable. -Check Cortisol level--16.3 -double home dose steroid  HLD: -Last LDL was79 on 05/21/13 -Continue home medications:Pravastatin   GERD: -Avoid PPI and H2 Blocker in the Setting of Possible C.Diff and qTC  prolongation     Disposition Plan:   Home in 1-2 days  Family Communication:   No Family at bedside--Total time spent 35 minutes.  Greater than 50% spent face to face counseling and coordinating care.  Consultants:  Cardiolpgy  Code Status:  FULL   DVT Prophylaxis:  warfarin   Procedures: As Listed in Progress Note Above  Antibiotics: None    Subjective: Patient is breathing better today. Denies any chest pain, nausea, vomiting. Had 2 loose stools in the past 24 hours without medication or melena. Denies no fevers, chills, headache, neck pain. Denies any abdominal pain or dysuria.  Objective: Vitals:   01/12/16 0333 01/12/16 0407 01/12/16 0408 01/12/16 0824  BP:  (!) 87/62    Pulse:  77    Resp:  (!) 21    Temp: 98 F (36.7 C)     TempSrc: Oral     SpO2:  98%  96%  Weight:   69.1 kg (152 lb 5.4 oz)   Height:        Intake/Output Summary (Last 24 hours) at 01/12/16 0841 Last data filed at 01/12/16 0400  Gross per 24 hour  Intake              970 ml  Output             2050 ml  Net            -1080 ml   Weight change:  Exam:   General:  Pt is alert, follows commands appropriately, not in acute distress  HEENT: No icterus, No thrush, No neck mass, O'Brien/AT  Cardiovascular: IRRR, S1/S2, no rubs, no gallops  Respiratory: Bibasilar crackles. No wheezing.  Abdomen: Soft/+BS, non tender, non distended, no guarding  Extremities: No edema, No lymphangitis, No petechiae, No rashes, no synovitis  Data Reviewed: I have personally reviewed following labs and imaging studies Basic Metabolic Panel:  Recent Labs Lab 01/08/16 1848 01/09/16 0609 01/10/16 0212 01/11/16 0335 01/12/16 0401  NA 136 135 133* 137 137  K 3.9 3.9 4.0 3.3* 3.3*  CL 105 107 104 108 109  CO2 24 21* 23 25 24   GLUCOSE 112* 112* 136* 132* 93  BUN 8 6 10 11 11   CREATININE 0.65 0.59 0.79 0.63 0.56  CALCIUM 7.6* 8.1* 8.3* 8.4* 8.1*  MG  --   --  1.8 2.0 1.9  PHOS  --   --  2.7 2.9 3.0     Liver Function Tests:  Recent Labs Lab 01/08/16 1257 01/08/16 1848 01/09/16 0609 01/10/16 0212 01/11/16 0335  AST 29 19 42* 28 26  ALT 25 17 28 24 22   ALKPHOS 62 50 55 53 53  BILITOT 0.9 0.9 1.0 0.5 0.5  PROT 7.3 5.8* 6.0* 5.7* 5.7*  ALBUMIN 3.7 2.9* 3.0* 2.8* 2.7*   No results for input(s): LIPASE, AMYLASE in the last 168 hours. No results for input(s): AMMONIA in the last 168 hours. Coagulation Profile:  Recent Labs Lab 01/08/16 1848 01/09/16 0609 01/10/16 0212 01/11/16 0335 01/12/16 0401  INR 1.39 1.35 1.99 2.24 2.72   CBC:  Recent Labs Lab 01/08/16 1848 01/09/16 0609 01/10/16 0212 01/11/16 0335 01/12/16 0401  WBC 16.2* 12.7* 12.2* 10.2 8.5  NEUTROABS 14.6*  --  10.7*  --   --   HGB 11.9* 12.1 10.3* 10.3* 10.0*  HCT 36.6 37.3 31.6* 31.1* 30.8*  MCV 91.3 91.2 91.3 91.5 91.4  PLT 263 245 234 213 234   Cardiac Enzymes:  Recent Labs Lab 01/08/16 1848 01/08/16 2304 01/09/16 0609  TROPONINI <0.03 <0.03 <0.03   BNP: Invalid input(s): POCBNP CBG:  Recent Labs Lab 01/08/16 1513  GLUCAP 192*   HbA1C: No results for input(s): HGBA1C in the last 72 hours. Urine analysis:    Component Value Date/Time   COLORURINE AMBER (A) 01/08/2016 1507   APPEARANCEUR TURBID (A) 01/08/2016 1507   LABSPEC 1.014 01/08/2016 1507   PHURINE 8.0 01/08/2016 1507   GLUCOSEU NEGATIVE 01/08/2016 1507   HGBUR NEGATIVE 01/08/2016 1507   BILIRUBINUR NEGATIVE 01/08/2016 1507   KETONESUR NEGATIVE 01/08/2016 1507   PROTEINUR 100 (A) 01/08/2016 1507   UROBILINOGEN 0.2 10/29/2013 1932   NITRITE NEGATIVE 01/08/2016 1507   LEUKOCYTESUR NEGATIVE 01/08/2016 1507   Sepsis Labs: @LABRCNTIP (procalcitonin:4,lacticidven:4) ) Recent Results (from the past 240 hour(s))  Blood culture (routine x 2)     Status: None (Preliminary result)   Collection Time: 01/08/16  2:14 PM  Result Value Ref Range Status   Specimen Description BLOOD LEFT FOREARM  Final   Special Requests IN  PEDIATRIC BOTTLE 5CC  Final   Culture   Final    NO GROWTH 3 DAYS Performed at [facility]    Report Status PENDING  Incomplete  Urine culture     Status: None   Collection Time: 01/08/16  3:07 PM  Result Value Ref Range Status   Specimen Description URINE, CATHETERIZED  Final   Special Requests NONE  Final   Culture NO GROWTH Performed at [facility]   Final   Report Status 01/10/2016 FINAL  Final  Blood culture (routine x 2)     Status: None (Preliminary result)   Collection Time: 01/08/16  3:15 PM  Result Value Ref Range Status   Specimen Description BLOOD RIGHT ANTECUBITAL  Final   Special Requests BOTTLES DRAWN  AEROBIC AND ANAEROBIC 5CC  Final   Culture   Final    NO GROWTH 3 DAYS Performed at [facility]    Report Status PENDING  Incomplete  Culture, blood (x 2)     Status: None (Preliminary result)   Collection Time: 01/08/16  6:48 PM  Result Value Ref Range Status   Specimen Description BLOOD LEFT ANTECUBITAL  Final   Special Requests IN PEDIATRIC BOTTLE [place]  Final   Culture   Final    NO GROWTH 2 DAYS Performed at [facility]    Report Status PENDING  Incomplete  Urine culture     Status: Abnormal   Collection Time: 01/08/16  8:12 PM  Result Value Ref Range Status   Specimen Description URINE, RANDOM  Final   Special Requests NONE  Final   Culture MULTIPLE SPECIES PRESENT, SUGGEST RECOLLECTION (A)  Final   Report Status 01/10/2016 FINAL  Final  Culture, blood (x 2)     Status: None (Preliminary result)   Collection Time: 01/08/16 11:04 PM  Result Value Ref Range Status   Specimen Description BLOOD BLOOD LEFT FOREARM  Final   Special Requests IN PEDIATRIC BOTTLE 2CC  Final   Culture   Final    NO GROWTH 2 DAYS Performed at [facility]    Report Status PENDING  Incomplete  Gastrointestinal Panel by PCR , Stool     Status: None   Collection Time: 01/09/16  3:02 PM  Result Value Ref Range Status   Campylobacter  species NOT DETECTED NOT DETECTED Final   Plesimonas shigelloides NOT DETECTED NOT DETECTED Final   Salmonella species NOT DETECTED NOT DETECTED Final   Yersinia enterocolitica NOT DETECTED NOT DETECTED Final   Vibrio species NOT DETECTED NOT DETECTED Final   Vibrio cholerae NOT DETECTED NOT DETECTED Final   Enteroaggregative E coli (EAEC) NOT DETECTED NOT DETECTED Final   Enteropathogenic E coli (EPEC) NOT DETECTED NOT DETECTED Final   Enterotoxigenic E coli (ETEC) NOT DETECTED NOT DETECTED Final   Shiga like toxin producing E coli (STEC) NOT DETECTED NOT DETECTED Final   Shigella/Enteroinvasive E coli (EIEC) NOT DETECTED NOT DETECTED Final   Cryptosporidium NOT DETECTED NOT DETECTED Final   Cyclospora cayetanensis NOT DETECTED NOT DETECTED Final   Entamoeba histolytica NOT DETECTED NOT DETECTED Final   Giardia lamblia NOT DETECTED NOT DETECTED Final   Adenovirus F40/41 NOT DETECTED NOT DETECTED Final   Astrovirus NOT DETECTED NOT DETECTED Final   Norovirus GI/GII NOT DETECTED NOT DETECTED Final   Rotavirus A NOT DETECTED NOT DETECTED Final   Sapovirus (I, II, IV, and V) NOT DETECTED NOT DETECTED Final     Scheduled Meds: . acidophilus  1 capsule Oral Daily  . ceFEPime (MAXIPIME) IV  1 g Intravenous Q8H  . chlorhexidine  15 mL Mouth Rinse BID  . cholecalciferol  2,000 Units Oral Daily  . folic acid  2 mg Oral Daily  . furosemide  20 mg Intravenous Once  . levalbuterol  0.63 mg Nebulization QID  . mouth rinse  15 mL Mouth Rinse q12n4p  . metoprolol succinate  100 mg Oral BID  . metroNIDAZOLE  500 mg Oral TID  . multivitamin with minerals  1 tablet Oral Daily  . potassium chloride  40 mEq Oral BID  . pravastatin  20 mg Oral QHS  . predniSONE  2.5 mg Oral Q breakfast  . vancomycin  1,000 mg Intravenous Q12H  . verapamil  120 mg Oral Q12H  .  warfarin  0.5 mg Oral ONCE-1800  . Warfarin - Pharmacist Dosing Inpatient   Does not apply q1800   Continuous Infusions: . sodium  chloride 10 mL/hr at 01/12/16 0400  . heparin 1,600 Units/hr (01/12/16 0817)    Procedures/Studies: Dg Chest 1 View  Result Date: 01/10/2016 CLINICAL DATA:  Cough. EXAM: CHEST 1 VIEW COMPARISON:  01/09/2016 FINDINGS: Enlargement of the cardiac silhouette is unchanged. Lung volumes are diminished, more so than on the prior study. There is peribronchial cuffing and diffuse interstitial accentuation which have increased from the prior study. Patchy opacities in both lung bases have also increased. Small pleural effusions persist. No pneumothorax is seen. No acute osseous abnormality is identified. IMPRESSION: 1. Cardiomegaly with likely mild interstitial edema and small pleural effusions. 2. Low lung volumes with increased bibasilar opacities, likely atelectasis. Electronically Signed   By: Logan Bores M.D.   On: 01/10/2016 06:42   Dg Chest 2 View  Result Date: 12/27/2015 CLINICAL DATA:  Fever shin EXAM: CHEST  2 VIEW COMPARISON:  12/16/2015 FINDINGS: Cardiac shadow remains enlarged. The lungs are well aerated bilaterally. No focal infiltrate or sizable effusion is seen. Previously seen pericardial catheter has been removed. Degenerative change of the thoracic spine is noted. IMPRESSION: No active cardiopulmonary disease. Electronically Signed   By: Inez Catalina M.D.   On: 12/27/2015 21:23   Dg Chest 2 View  Result Date: 12/13/2015 CLINICAL DATA:  Follow-up of pneumonia. The patient has undergone antibiotic treatment. The patient reports persistent chest pain and shortness of breath. History of atrial fibrillation. EXAM: CHEST  2 VIEW COMPARISON:  PA and lateral chest x-ray dated September 15, 2015 FINDINGS: The cardiopericardial silhouette remains quite enlarged and has increased in conspicuity since the previous study. The lungs are well-expanded. There is no focal infiltrate. The pulmonary vascularity is not engorged. There is calcification in the wall of the aortic arch. There is mild multilevel  degenerative disc disease of the thoracic spine. IMPRESSION: No evidence of pneumonia. Marked enlargement of the cardiac silhouette which is more conspicuous than on the previous study. This may reflect the presence of a pericardial effusion. No pulmonary edema. Thoracic aortic atherosclerosis. Electronically Signed   By: Skanda Worlds  [place] M.D.   On: 12/13/2015 16:04   Ct Head Wo Contrast  Result Date: 01/08/2016 CLINICAL DATA:  Fever with altered mental status. EXAM: CT HEAD WITHOUT CONTRAST TECHNIQUE: Contiguous axial images were obtained from the base of the skull through the vertex without intravenous contrast. COMPARISON:  12/28/2015 FINDINGS: Brain: No evidence of acute infarction, hemorrhage, hydrocephalus, extra-axial collection or mass lesion/mass effect. Stable mild brain parenchymal atrophy and microangiopathic changes in the deep white matter. Vascular: No hyperdense vessel or unexpected calcification. Skull: Normal. Negative for fracture or focal lesion. Sinuses/Orbits: No acute finding. Other: None. IMPRESSION: No acute intracranial abnormality. Stable mild brain parenchymal atrophy and chronic microvascular disease. Electronically Signed   By: Fidela Salisbury M.D.   On: 01/08/2016 17:33   Ct Head Wo Contrast  Result Date: 12/28/2015 CLINICAL DATA:  75 year old female with fall.  Generalized weakness. EXAM: CT HEAD WITHOUT CONTRAST TECHNIQUE: Contiguous axial images were obtained from the base of the skull through the vertex without intravenous contrast. COMPARISON:  Head CT dated 05/23/2015 FINDINGS: Brain: The ventricles and sulci are appropriate in size for patient's age. Mild periventricular and deep white matter chronic microvascular ischemic changes noted. There is no acute intracranial hemorrhage. No mass effect or midline shift noted. Vascular: No hyperdense vessel or unexpected calcification. Skull:  Normal. Negative for fracture or focal lesion. Sinuses/Orbits: No acute finding.  Other: None IMPRESSION: No acute intracranial pathology. Mild age-related atrophy and chronic microvascular ischemic changes. Electronically Signed   By: Anner Crete M.D.   On: 12/28/2015 02:04   Ct Chest W Contrast  Result Date: 01/08/2016 CLINICAL DATA:  Altered mental status. Fever. Cough. Abdominal pain. Diarrhea. Atrial fibrillation on Coumadin. Hypertension. Possible pneumonia or colitis. EXAM: CT CHEST, ABDOMEN, AND PELVIS WITH CONTRAST TECHNIQUE: Multidetector CT imaging of the chest, abdomen and pelvis was performed following the standard protocol during bolus administration of intravenous contrast. CONTRAST:  121mL ISOVUE-300 IOPAMIDOL (ISOVUE-300) INJECTION 61% COMPARISON:  Plain films 01/08/2016. Chest CT 12/15/2015. Abdominopelvic CT 12/29/2015. FINDINGS: CT CHEST FINDINGS Cardiovascular: Aortic and branch vessel atherosclerosis. Tortuous thoracic aorta. Moderate cardiomegaly. Small pericardial effusion is similar to slightly increased since 12/29/2015. No central pulmonary embolism, on this non-dedicated study. Mediastinum/Nodes: Enlargement of a node within the azygoesophageal recess at 1.4 cm on image 26/series 2. Favored to be reactive, given development over the past 3 weeks. No hilar adenopathy. Tiny hiatal hernia. Lungs/Pleura: Small, right greater than left pleural effusions. Mild to moderate degradation secondary to motion and patient arm position, not raised above the head. Bibasilar atelectasis. Musculoskeletal: Lower thoracic hypoattenuating lesions are likely hemangiomas. CT ABDOMEN PELVIS FINDINGS Hepatobiliary: Hepatomegaly at 22.6 cm. Small gallstones without acute cholecystitis or biliary duct dilatation. Pancreas: Normal, without mass or ductal dilatation. Spleen: Splenic hypoattenuation is wedge-shaped, primarily superiorly on image 49/series 2. New since the prior. Adrenals/Urinary Tract: Normal adrenal glands. Normal left kidney. New hypoattenuation involving the lower  pole right kidney is suspicious for infarct, including on image 75/ series 2. No hydronephrosis. Normal urinary bladder. Stomach/Bowel: Normal remainder of the stomach. Colonic stool burden suggests constipation. Normal small bowel. Vascular/Lymphatic: Aortic and branch vessel atherosclerosis. No abdominopelvic adenopathy. Reproductive: Hysterectomy.  No adnexal mass. Other: No significant free fluid. Musculoskeletal: Lumbosacral spondylosis with degenerative disc disease at the lumbosacral junction. IMPRESSION: 1. multifactorial degradation, especially involving the chest. 2. 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 Suspect a central embolic source. Consider echocardiography. 3. Cholelithiasis. 4. Small bilateral pleural effusions with bibasilar atelectasis. 5. Cardiomegaly with slight increase in small pericardial effusion. 6.  Aortic atherosclerosis. These results will be called to the ordering clinician or representative by the Radiologist Assistant, and communication documented in the PACS or zVision Dashboard. Electronically Signed   By: Abigail Miyamoto M.D.   On: 01/08/2016 17:54   Ct Chest W Contrast  Result Date: 12/15/2015 CLINICAL DATA:  Increased cough, shortness of breath, weakness and decreased appetite over the past few weeks. Loculated pleural effusion. EXAM: CT CHEST WITH CONTRAST TECHNIQUE: Multidetector CT imaging of the chest was performed during intravenous contrast administration. CONTRAST:  75mL ISOVUE-300 IOPAMIDOL (ISOVUE-300) INJECTION 61% COMPARISON:  05/24/2015. FINDINGS: Cardiovascular: Atherosclerotic calcification of the arterial vasculature. Heart is mildly enlarged. Large pericardial effusion is new from 05/24/2015. Mediastinum/Nodes: Mediastinal lymph nodes are not enlarged by CT size criteria. No hilar or axillary adenopathy. Esophagus is grossly unremarkable. Tiny hiatal hernia. Lungs/Pleura: Mild volume loss in the medial aspects of the right middle lobe and  lingula. Minimal dependent atelectasis bilaterally. Lungs are otherwise clear. No pleural fluid. Airway is unremarkable. Upper Abdomen: Visualized portions of the liver, adrenal glands, kidneys, spleen, pancreas, stomach and bowel are grossly unremarkable with exception of a tiny hiatal hernia. Upper abdominal lymph nodes are not enlarged by CT size criteria. Musculoskeletal: No worrisome lytic or sclerotic lesions. Degenerative changes are seen  in the spine. IMPRESSION: 1. Large pericardial effusion, new from 05/24/2015. These results will be called to the ordering clinician or representative by the Radiologist Assistant, and communication documented in the PACS or zVision Dashboard. 2.  Aortic atherosclerosis (ICD10-170.0). Electronically Signed   By: Lorin Picket M.D.   On: 12/15/2015 16:08   Ct Abdomen Pelvis W Contrast  Result Date: 01/08/2016 CLINICAL DATA:  Altered mental status. Fever. Cough. Abdominal pain. Diarrhea. Atrial fibrillation on Coumadin. Hypertension. Possible pneumonia or colitis. EXAM: CT CHEST, ABDOMEN, AND PELVIS WITH CONTRAST TECHNIQUE: Multidetector CT imaging of the chest, abdomen and pelvis was performed following the standard protocol during bolus administration of intravenous contrast. CONTRAST:  113mL ISOVUE-300 IOPAMIDOL (ISOVUE-300) INJECTION 61% COMPARISON:  Plain films 01/08/2016. Chest CT 12/15/2015. Abdominopelvic CT 12/29/2015. FINDINGS: CT CHEST FINDINGS Cardiovascular: Aortic and branch vessel atherosclerosis. Tortuous thoracic aorta. Moderate cardiomegaly. Small pericardial effusion is similar to slightly increased since 12/29/2015. No central pulmonary embolism, on this non-dedicated study. Mediastinum/Nodes: Enlargement of a node within the azygoesophageal recess at 1.4 cm on image 26/series 2. Favored to be reactive, given development over the past 3 weeks. No hilar adenopathy. Tiny hiatal hernia. Lungs/Pleura: Small, right greater than left pleural effusions.  Mild to moderate degradation secondary to motion and patient arm position, not raised above the head. Bibasilar atelectasis. Musculoskeletal: Lower thoracic hypoattenuating lesions are likely hemangiomas. CT ABDOMEN PELVIS FINDINGS Hepatobiliary: Hepatomegaly at 22.6 cm. Small gallstones without acute cholecystitis or biliary duct dilatation. Pancreas: Normal, without mass or ductal dilatation. Spleen: Splenic hypoattenuation is wedge-shaped, primarily superiorly on image 49/series 2. New since the prior. Adrenals/Urinary Tract: Normal adrenal glands. Normal left kidney. New hypoattenuation involving the lower pole right kidney is suspicious for infarct, including on image 75/ series 2. No hydronephrosis. Normal urinary bladder. Stomach/Bowel: Normal remainder of the stomach. Colonic stool burden suggests constipation. Normal small bowel. Vascular/Lymphatic: Aortic and branch vessel atherosclerosis. No abdominopelvic adenopathy. Reproductive: Hysterectomy.  No adnexal mass. Other: No significant free fluid. Musculoskeletal: Lumbosacral spondylosis with degenerative disc disease at the lumbosacral junction. IMPRESSION: 1. multifactorial degradation, especially involving the chest. 2. 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 Suspect a central embolic source. Consider echocardiography. 3. Cholelithiasis. 4. Small bilateral pleural effusions with bibasilar atelectasis. 5. Cardiomegaly with slight increase in small pericardial effusion. 6.  Aortic atherosclerosis. These results will be called to the ordering clinician or representative by the Radiologist Assistant, and communication documented in the PACS or zVision Dashboard. Electronically Signed   By: Abigail Miyamoto M.D.   On: 01/08/2016 17:54   Ct Abdomen Pelvis W Contrast  Result Date: 12/29/2015 CLINICAL DATA:  Chronic AFib with worsening fatigue in diarrhea EXAM: CT ABDOMEN AND PELVIS WITH CONTRAST TECHNIQUE: Multidetector CT imaging of the  abdomen and pelvis was performed using the standard protocol following bolus administration of intravenous contrast. CONTRAST:  127mL ISOVUE-300 IOPAMIDOL (ISOVUE-300) INJECTION 61% COMPARISON:  05/25/2015 ultrasound, CT 04/08/2010 FINDINGS: Lower chest: Small bilateral right greater than left pleural effusion. Passive atelectasis in the bilateral lower lobes. 2.5 cm cyst or bleb in the right lower lobe. Small bleb posterior left lower lobe. There is cardiomegaly with biatrial enlargement. Small pericardial effusion. Small nodular focus right middle lobe, possibly inflammatory or related to atelectasis. Hepatobiliary: Mild diffuse decreased density of the hepatic parenchyma consistent with fatty change. No focal hepatic abnormality. The gallbladder is contracted and filled with hyperdense sludge or calcified stones. No biliary dilatation. Pancreas: Unremarkable. No pancreatic ductal dilatation or surrounding inflammatory changes. Spleen: Normal in  size without focal abnormality. Adrenals/Urinary Tract: Adrenal glands are within normal limits. Subcentimeter cortical hypodense lesions in the kidneys, too small to further characterize. No hydronephrosis. Bladder is unremarkable Stomach/Bowel: Stomach is nonenlarged. No dilated small bowel to suggest an obstruction. There is no colon wall thickening. The appendix is not well identified but no right lower quadrant inflammation is seen. Vascular/Lymphatic: Aortic atherosclerosis. No enlarged abdominal or pelvic lymph nodes. Reproductive: Status post hysterectomy. No adnexal masses. Other: Trace amount of fluid in the pelvis.  No free air. Musculoskeletal: Degenerative changes of the spine. Probable hemangioma in T11. IMPRESSION: 1. No evidence for bowel obstruction. No significant colon wall thickening or colon inflammation. 2. Small bilateral pleural effusions, right greater than left. 3. Cardiomegaly with biatrial enlargement. Small pericardial effusion. 4. Mild fatty  infiltration of the liver 5. Contracted gallbladder filled with calcified stones Electronically Signed   By: Donavan Foil M.D.   On: 12/29/2015 21:02   Dg Chest Port 1 View  Result Date: 01/09/2016 CLINICAL DATA:  75 year old female with sepsis. EXAM: PORTABLE CHEST 1 VIEW COMPARISON:  Chest radiograph dated 01/08/2016 and CT dated 01/08/2016 FINDINGS: There are small bilateral pleural effusions, left greater right and bibasilar hazy densities compatible with atelectasis versus infiltrate. There is moderate cardiomegaly. No pneumothorax. Osteopenia with degenerative changes of the spine. No acute osseous pathology. IMPRESSION: Small bilateral pleural effusions and bibasilar atelectasis versus infiltrate. Moderate cardiomegaly. Electronically Signed   By: Anner Crete M.D.   On: 01/09/2016 06:24   Dg Chest Port 1 View  Result Date: 12/16/2015 CLINICAL DATA:  Pericardiocentesis for pericardial effusion. EXAM: PORTABLE CHEST 1 VIEW COMPARISON:  CT from 12/15/2015 FINDINGS: There is a catheter noted along the base of the heart with the tip in head the right cardiophrenic angle and base of heart. Mild interstitial prominence is noted without pneumothorax. The cardiac silhouette is enlarged. There is mild aortic atherosclerosis. No pneumonic consolidation or suspicious osseous lesions. IMPRESSION: Catheter tip is seen along the base the heart to the right of midline. Enlarged cardiac silhouette is noted. There is aortic atherosclerosis. No pneumothorax is identified. Electronically Signed   By: [name] Royalty M.D.   On: 12/16/2015 19:24   Dg Abd Acute W/chest  Result Date: 01/08/2016 CLINICAL DATA:  Abdominal pain, weakness, nausea/vomiting EXAM: DG ABDOMEN ACUTE W/ 1V CHEST COMPARISON:  CT abdomen/pelvis dated 12/29/2015 FINDINGS: Small bilateral pleural effusions. Possible mild perihilar edema. Mild left lower lobe opacity, likely atelectasis. Cardiomegaly. Nonobstructive bowel gas pattern. No evidence  of free air on the lateral decubitus view. Mild degenerative changes the visualized thoracolumbar spine. IMPRESSION: Cardiomegaly with possible mild perihilar edema. Small bilateral pleural effusions. Mild left lower lobe opacity, likely atelectasis. No evidence of small bowel obstruction or free air. Electronically Signed   By: Julian Hy M.D.   On: 01/08/2016 13:48    Saudia Smyser, DO  [facility] Pager [phone]  If 7PM-7AM, please contact night-coverage [email or web address] Password [email or web address] 01/12/2016, 8:41 AM   LOS: 4 days

## 2016-01-13 DIAGNOSIS — I10 Essential (primary) hypertension: Secondary | ICD-10-CM

## 2016-01-13 DIAGNOSIS — D735 Infarction of spleen: Secondary | ICD-10-CM

## 2016-01-13 DIAGNOSIS — N280 Ischemia and infarction of kidney: Secondary | ICD-10-CM

## 2016-01-13 DIAGNOSIS — I313 Pericardial effusion (noninflammatory): Secondary | ICD-10-CM

## 2016-01-13 DIAGNOSIS — I5033 Acute on chronic diastolic (congestive) heart failure: Secondary | ICD-10-CM

## 2016-01-13 LAB — BASIC METABOLIC PANEL
Anion gap: 7 (ref 5–15)
BUN: 8 mg/dL (ref 6–20)
CALCIUM: 8.7 mg/dL — AB (ref 8.9–10.3)
CHLORIDE: 108 mmol/L (ref 101–111)
CO2: 26 mmol/L (ref 22–32)
CREATININE: 0.6 mg/dL (ref 0.44–1.00)
GFR calc Af Amer: >60 mL/min (ref >60)
GFR calc non Af Amer: >60 mL/min (ref >60)
Glucose, Bld: 125 mg/dL — ABNORMAL HIGH (ref 65–99)
Potassium: 3.9 mmol/L (ref 3.5–5.1)
SODIUM: 141 mmol/L (ref 135–145)

## 2016-01-13 LAB — CBC
HEMATOCRIT: 31.8 % — AB (ref 36.0–46.0)
Hemoglobin: 10.4 g/dL — ABNORMAL LOW (ref 12.0–15.0)
MCH: 29.6 pg (ref 26.0–34.0)
MCHC: 32.7 g/dL (ref 30.0–36.0)
MCV: 90.6 fL (ref 78.0–100.0)
Platelets: 297 10*3/uL (ref 150–400)
RBC: 3.51 MIL/uL — ABNORMAL LOW (ref 3.87–5.11)
RDW: 16 % — AB (ref 11.5–15.5)
WBC: 8 10*3/uL (ref 4.0–10.5)

## 2016-01-13 LAB — CULTURE, BLOOD (ROUTINE X 2)
Culture: NO GROWTH
Culture: NO GROWTH

## 2016-01-13 LAB — PROTIME-INR
INR: 2.65
PROTHROMBIN TIME: 28.8 s — AB (ref 11.4–15.2)

## 2016-01-13 LAB — MAGNESIUM: Magnesium: 2 mg/dL (ref 1.7–2.4)

## 2016-01-13 MED ORDER — LEVALBUTEROL HCL 0.63 MG/3ML IN NEBU
0.6300 mg | INHALATION_SOLUTION | Freq: Two times a day (BID) | RESPIRATORY_TRACT | Status: DC
  Administered 2016-01-13 – 2016-01-15 (×5): 0.63 mg via RESPIRATORY_TRACT
  Filled 2016-01-13 (×6): qty 3

## 2016-01-13 MED ORDER — WARFARIN SODIUM 1 MG PO TABS
1.0000 mg | ORAL_TABLET | Freq: Once | ORAL | Status: AC
  Administered 2016-01-13: 1 mg via ORAL
  Filled 2016-01-13: qty 1

## 2016-01-13 NOTE — Progress Notes (Addendum)
Pt had nose bleed, lasting less than one minute. Few drops of blood noted on single tissue. Nose appears to no longer be bleeding. Will continue to monitor.

## 2016-01-13 NOTE — Progress Notes (Signed)
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 Patient was discharged home and did well the first few days but then states lost her appetite and started coughing and had worsening diarrhea over the course of the week. Patient states she became more fatigued and developed fevers and did not want to come back to the ER at the urge of her daughter. She Admitted to Nausea but no vomiting and states she has chest wall pain and tenderness from coughing. Patient thinks liquidly diarrhea is getting worse and has had countless episodes to the point where she defecates on herself. No blood in stool noted. Has had fevers and chills at home and has worsened since discharge last Sunday. Per daughter has been so weak and has only lying in bed and not been eating. Saw her Cardiologist 4 days ago and states that when she started feeling worse. States feels very similar to last hospitalization. No other concerns or complaints at this time. Hospitalist was called to Admit because of Sepsis.  Assessment/Plan: Sepsis of Unclear Etiology possible from Health Care Acquired Pneumonia vs. Gastrointestinal Illness -Patient is Septic on Admission with Fever of 102, Tachycardia, Tachypnea, Leukocytosis of 18.8 and Lactic Acid Level of 4.83. Likely HCAP vs viral infection -PCT--0.12; lactate 4.83--->1.3 -Check C. Difficile PCR neg at last admit but treated empirically with metronidazole -Blood Cx's x2 showed No growth   -Urinalysis Negative  -Normal Saline (30 mL/kg) initial Bolus and then 100 mL/hr; IVF Now  D/C'd -Chest and Abdominal Imaging X-Ray showed Cardiomegaly with possible mild perihilar edema. Small bilateral pleural effusions.Mild left lower lobe opacity, likely atelectasis. No evidence of small bowel obstruction or free air. -WBC went from 18.8 ->12.2 ->8.5 -Discontinue vancomycin; finished 5 days vancomycin and cefepime -Continue metronidazole another 24 hours -Follow blood culture, urine culture--neg -CT of Chest/Abd/Pelvis Not revealing infection -fever partly attributable to new splenic and renal infarc  Atrial Fibrillation with RVR:  -CHA2DS2-VASc Scoreis 3- Was on Oral Anticoagulation with Coumadin *currently held due to Bloody Pericardial Effusion -Coumadin was held because of Bloody Pericardial Effusion; Per notes was to be started During the First Week of January; However given CT ABD/PELVIS FINDINGS RESTARTED HEPARIN gtt and Warfarain-  -INR Now Therapeutic  -D/C Heparin gtt.  -Restarted Metoprolol and Verapamil  -HR remains 110-120s -Cardiology Consulted.  recommends continuing current management and continue with po Metoprolol and Verapamil.  -Possible IV Amiodarone vs IV Lopressor per Cards.   Acute on Chronic systolic congestive heart failure: -ECHOon 12/29/15 showed EF of 50-55 %. However previous [facility] showed EF of 40-45%.  -BNP was 302.0 -Strict I's and O's and Daily Weight--was +5.7L -Given IV Lasix 12/25, 12/26, 12/27-->out 5 L -am BMP  Diarrhea r/o C. Difficile:  -Enteric Percautions--discontinued as the patient has not had any diarrhea -CT of Abd/Pelvis showed Splenic and Right Renal Infarcts which are new.  -Stomach/Bowel: Normal remainder of the stomach. Colonic stool burden suggests constipation. Normal small bowel. -GI Stool Panel Negative -C/w Home Metronidazole 500 mg po q8h and Acidophilus; plan to stop in 24 -Diet Advanced; Added Simethicone Chewable Tabs for Gas Pain  Acute respiratory  failure with hypoxia -secondary to PNA and  CHF -resolved -now stable on RA  Hypotension -Bolused  -Continue to Monitor -became fluid overloaded-->lasix  -resolved  Visual Hallucinations -Head CT Negative and showed No acute intracranial abnormality. Stable mild brain parenchymal atrophy and chronic microvascular disease. -Avoid Sedatives -resolved  Hypokalemia -Replete -Repeat BMP in AM -check mag--2.0  Mild Hyponatremia -Improved.  Atypical CP r/o ACS -Troponin I x 3 Negative -Repeat EKG showed qTC of 510; No ST Elevation or Depression but did show Atrial Fibrillation with RVR  Prolonged qTC -EKG this Admission was 550 -Avoid Zofran, Phenergan, Ranitidine; Started Compazine -Monitor SDU with Telemetry  Generalized Weakness and Deconditioning -PT/OT Evaluation -C/w IVF  -Nutritionist Consult  Pericardial Effusion:  -S/p of pericardiocentesis and Pericardial drain placement with removal of 500 mL of Dark Sanguinous Fluid during the Admission of 11/29-12/3. -She underwent repeatedechocardiograms without significant fluid accumulation before discharge. -Did not want Colchicine for Pericarditis -CT of the Chest showed Cardiomegaly with slight increase in small pericardial effusion. -01/09/16--ECHOCardiogram did not show Pericardial Effusion.    Rheumatoid Arthritis:  -On prednisone 2.5 mg daily at home--on stress dose presently -Hold Home Methotrexate and Etenercept. Clinically stable. -double home dose steroid  HLD: -Last LDL was79 on 05/21/13 -Continue home medications:Pravastatin   GERD: -Avoid PPI and H2 Blocker in the Setting of Possible C.Diff and qTC prolongation     Disposition Plan:   Home when cleared by cardiology Family Communication:   No Family at bedside--Total time spent 35 minutes.  Greater than 50% spent face to face counseling and coordinating care.  Consultants:  Cardiolpgy  Code Status:  FULL   DVT Prophylaxis:  warfarin   Procedures: As Listed  in Progress Note Above  Antibiotics: None   Subjective: Patient is breathing better. She denies any fevers, chills, chest pain, nausea, vomiting, diarrhea. She had 1 bowel movement yesterday. No abdominal pain or dysuria. Denies any headache or neck pain. No hematochezia, hematuria.  Objective: Vitals:   01/13/16 0600 01/13/16 0700 01/13/16 0803 01/13/16 0842  BP: 106/68     Pulse: (!) 106 (!) 119    Resp: (!) 26 (!) 25    Temp:   98.7 F (37.1 C)   TempSrc:   Oral   SpO2: 94% 94%  97%  Weight:      Height:        Intake/Output Summary (Last 24 hours) at 01/13/16 0854 Last data filed at 01/13/16 0600  Gross per 24 hour  Intake              320 ml  Output             2750 ml  Net            -2430 ml   Weight change: -3.9 kg (-8 lb 9.6 oz) Exam:   General:  Pt is alert, follows commands appropriately, not in acute distress  HEENT: No icterus, No thrush, No neck mass, [place]/AT  Cardiovascular: IRRR, S1/S2, no rubs, no gallops  Respiratory: Bibasilar crackles. No wheezing. Good air movement.  Abdomen: Soft/+BS, non tender, non distended, no guarding  Extremities: No edema, No lymphangitis, No petechiae, No rashes, no synovitis   Data Reviewed: I have personally reviewed following labs and imaging studies Basic Metabolic Panel:  Recent Labs Lab 01/09/16 0609 01/10/16 0212 01/11/16 0335 01/12/16 0401 01/13/16 0332  NA 135 133* 137 137 141  K 3.9 4.0 3.3* 3.3* 3.9  CL 107 104 108 109 108  CO2  21* 23 25 24 26   GLUCOSE 112* 136* 132* 93 125*  BUN 6 10 11 11 8   CREATININE 0.59 0.79 0.63 0.56 0.60  CALCIUM 8.1* 8.3* 8.4* 8.1* 8.7*  MG  --  1.8 2.0 1.9 2.0  PHOS  --  2.7 2.9 3.0  --    Liver Function Tests:  Recent Labs Lab 01/08/16 1257 01/08/16 1848 01/09/16 0609 01/10/16 0212 01/11/16 0335  AST 29 19 42* 28 26  ALT 25 17 28 24 22   ALKPHOS 62 50 55 53 53  BILITOT 0.9 0.9 1.0 0.5 0.5  PROT 7.3 5.8* 6.0* 5.7* 5.7*  ALBUMIN 3.7 2.9* 3.0* 2.8* 2.7*    No results for input(s): LIPASE, AMYLASE in the last 168 hours. No results for input(s): AMMONIA in the last 168 hours. Coagulation Profile:  Recent Labs Lab 01/09/16 0609 01/10/16 0212 01/11/16 0335 01/12/16 0401 01/13/16 0332  INR 1.35 1.99 2.24 2.72 2.65   CBC:  Recent Labs Lab 01/08/16 1848 01/09/16 0609 01/10/16 0212 01/11/16 0335 01/12/16 0401 01/13/16 0332  WBC 16.2* 12.7* 12.2* 10.2 8.5 8.0  NEUTROABS 14.6*  --  10.7*  --   --   --   HGB 11.9* 12.1 10.3* 10.3* 10.0* 10.4*  HCT 36.6 37.3 31.6* 31.1* 30.8* 31.8*  MCV 91.3 91.2 91.3 91.5 91.4 90.6  PLT 263 245 234 213 234 297   Cardiac Enzymes:  Recent Labs Lab 01/08/16 1848 01/08/16 2304 01/09/16 0609  TROPONINI <0.03 <0.03 <0.03   BNP: Invalid input(s): POCBNP CBG:  Recent Labs Lab 01/08/16 1513  GLUCAP 192*   HbA1C: No results for input(s): HGBA1C in the last 72 hours. Urine analysis:    Component Value Date/Time   COLORURINE AMBER (A) 01/08/2016 1507   APPEARANCEUR TURBID (A) 01/08/2016 1507   LABSPEC 1.014 01/08/2016 1507   PHURINE 8.0 01/08/2016 1507   GLUCOSEU NEGATIVE 01/08/2016 1507   HGBUR NEGATIVE 01/08/2016 1507   BILIRUBINUR NEGATIVE 01/08/2016 1507   KETONESUR NEGATIVE 01/08/2016 1507   PROTEINUR 100 (A) 01/08/2016 1507   UROBILINOGEN 0.2 10/29/2013 1932   NITRITE NEGATIVE 01/08/2016 1507   LEUKOCYTESUR NEGATIVE 01/08/2016 1507   Sepsis Labs: @LABRCNTIP (procalcitonin:4,lacticidven:4) ) Recent Results (from the past 240 hour(s))  Blood culture (routine x 2)     Status: None (Preliminary result)   Collection Time: 01/08/16  2:14 PM  Result Value Ref Range Status   Specimen Description BLOOD LEFT FOREARM  Final   Special Requests IN PEDIATRIC BOTTLE 5CC  Final   Culture   Final    NO GROWTH 4 DAYS Performed at [facility]    Report Status PENDING  Incomplete  Urine culture     Status: None   Collection Time: 01/08/16  3:07 PM  Result Value Ref Range Status    Specimen Description URINE, CATHETERIZED  Final   Special Requests NONE  Final   Culture NO GROWTH Performed at [facility]   Final   Report Status 01/10/2016 FINAL  Final  Blood culture (routine x 2)     Status: None (Preliminary result)   Collection Time: 01/08/16  3:15 PM  Result Value Ref Range Status   Specimen Description BLOOD RIGHT ANTECUBITAL  Final   Special Requests BOTTLES DRAWN AEROBIC AND ANAEROBIC 5CC  Final   Culture   Final    NO GROWTH 4 DAYS Performed at [facility]    Report Status PENDING  Incomplete  Culture, blood (x 2)     Status: None (Preliminary  result)   Collection Time: 01/08/16  6:48 PM  Result Value Ref Range Status   Specimen Description BLOOD LEFT ANTECUBITAL  Final   Special Requests IN PEDIATRIC BOTTLE 2CC  Final   Culture   Final    NO GROWTH 3 DAYS Performed at [facility]    Report Status PENDING  Incomplete  Urine culture     Status: Abnormal   Collection Time: 01/08/16  8:12 PM  Result Value Ref Range Status   Specimen Description URINE, RANDOM  Final   Special Requests NONE  Final   Culture MULTIPLE SPECIES PRESENT, SUGGEST RECOLLECTION (A)  Final   Report Status 01/10/2016 FINAL  Final  Culture, blood (x 2)     Status: None (Preliminary result)   Collection Time: 01/08/16 11:04 PM  Result Value Ref Range Status   Specimen Description BLOOD BLOOD LEFT FOREARM  Final   Special Requests IN PEDIATRIC BOTTLE 2CC  Final   Culture   Final    NO GROWTH 3 DAYS Performed at [facility]    Report Status PENDING  Incomplete  Gastrointestinal Panel by PCR , Stool     Status: None   Collection Time: 01/09/16  3:02 PM  Result Value Ref Range Status   Campylobacter species NOT DETECTED NOT DETECTED Final   Plesimonas shigelloides NOT DETECTED NOT DETECTED Final   Salmonella species NOT DETECTED NOT DETECTED Final   Yersinia enterocolitica NOT DETECTED NOT DETECTED Final   Vibrio species NOT DETECTED NOT  DETECTED Final   Vibrio cholerae NOT DETECTED NOT DETECTED Final   Enteroaggregative E coli (EAEC) NOT DETECTED NOT DETECTED Final   Enteropathogenic E coli (EPEC) NOT DETECTED NOT DETECTED Final   Enterotoxigenic E coli (ETEC) NOT DETECTED NOT DETECTED Final   Shiga like toxin producing E coli (STEC) NOT DETECTED NOT DETECTED Final   Shigella/Enteroinvasive E coli (EIEC) NOT DETECTED NOT DETECTED Final   Cryptosporidium NOT DETECTED NOT DETECTED Final   Cyclospora cayetanensis NOT DETECTED NOT DETECTED Final   Entamoeba histolytica NOT DETECTED NOT DETECTED Final   Giardia lamblia NOT DETECTED NOT DETECTED Final   Adenovirus F40/41 NOT DETECTED NOT DETECTED Final   Astrovirus NOT DETECTED NOT DETECTED Final   Norovirus GI/GII NOT DETECTED NOT DETECTED Final   Rotavirus A NOT DETECTED NOT DETECTED Final   Sapovirus (I, II, IV, and V) NOT DETECTED NOT DETECTED Final     Scheduled Meds: . acidophilus  1 capsule Oral Daily  . chlorhexidine  15 mL Mouth Rinse BID  . cholecalciferol  2,000 Units Oral Daily  . folic acid  2 mg Oral Daily  . levalbuterol  0.63 mg Nebulization BID  . mouth rinse  15 mL Mouth Rinse q12n4p  . metoprolol succinate  100 mg Oral BID  . metroNIDAZOLE  500 mg Oral TID  . multivitamin with minerals  1 tablet Oral Daily  . pravastatin  20 mg Oral QHS  . predniSONE  5 mg Oral Q breakfast  . verapamil  120 mg Oral Q12H  . Warfarin - Pharmacist Dosing Inpatient   Does not apply q1800   Continuous Infusions: . sodium chloride 10 mL/hr at 01/12/16 0400    Procedures/Studies: Dg Chest 1 View  Result Date: 01/10/2016 CLINICAL DATA:  Cough. EXAM: CHEST 1 VIEW COMPARISON:  01/09/2016 FINDINGS: Enlargement of the cardiac silhouette is unchanged. Lung volumes are diminished, more so than on the prior study. There is peribronchial cuffing and diffuse interstitial accentuation which have increased from  the prior study. Patchy opacities in both lung bases have also  increased. Small pleural effusions persist. No pneumothorax is seen. No acute osseous abnormality is identified. IMPRESSION: 1. Cardiomegaly with likely mild interstitial edema and small pleural effusions. 2. Low lung volumes with increased bibasilar opacities, likely atelectasis. Electronically Signed   By: Logan Bores M.D.   On: 01/10/2016 06:42   Dg Chest 2 View  Result Date: 12/27/2015 CLINICAL DATA:  Fever shin EXAM: CHEST  2 VIEW COMPARISON:  12/16/2015 FINDINGS: Cardiac shadow remains enlarged. The lungs are well aerated bilaterally. No focal infiltrate or sizable effusion is seen. Previously seen pericardial catheter has been removed. Degenerative change of the thoracic spine is noted. IMPRESSION: No active cardiopulmonary disease. Electronically Signed   By: Inez Catalina M.D.   On: 12/27/2015 21:23   Ct Head Wo Contrast  Result Date: 01/08/2016 CLINICAL DATA:  Fever with altered mental status. EXAM: CT HEAD WITHOUT CONTRAST TECHNIQUE: Contiguous axial images were obtained from the base of the skull through the vertex without intravenous contrast. COMPARISON:  12/28/2015 FINDINGS: Brain: No evidence of acute infarction, hemorrhage, hydrocephalus, extra-axial collection or mass lesion/mass effect. Stable mild brain parenchymal atrophy and microangiopathic changes in the deep white matter. Vascular: No hyperdense vessel or unexpected calcification. Skull: Normal. Negative for fracture or focal lesion. Sinuses/Orbits: No acute finding. Other: None. IMPRESSION: No acute intracranial abnormality. Stable mild brain parenchymal atrophy and chronic microvascular disease. Electronically Signed   By: Fidela Salisbury M.D.   On: 01/08/2016 17:33   Ct Head Wo Contrast  Result Date: 12/28/2015 CLINICAL DATA:  75 year old female with fall.  Generalized weakness. EXAM: CT HEAD WITHOUT CONTRAST TECHNIQUE: Contiguous axial images were obtained from the base of the skull through the vertex without  intravenous contrast. COMPARISON:  Head CT dated 05/23/2015 FINDINGS: Brain: The ventricles and sulci are appropriate in size for patient's age. Mild periventricular and deep white matter chronic microvascular ischemic changes noted. There is no acute intracranial hemorrhage. No mass effect or midline shift noted. Vascular: No hyperdense vessel or unexpected calcification. Skull: Normal. Negative for fracture or focal lesion. Sinuses/Orbits: No acute finding. Other: None IMPRESSION: No acute intracranial pathology. Mild age-related atrophy and chronic microvascular ischemic changes. Electronically Signed   By: Anner Crete M.D.   On: 12/28/2015 02:04   Ct Chest W Contrast  Result Date: 01/08/2016 CLINICAL DATA:  Altered mental status. Fever. Cough. Abdominal pain. Diarrhea. Atrial fibrillation on Coumadin. Hypertension. Possible pneumonia or colitis. EXAM: CT CHEST, ABDOMEN, AND PELVIS WITH CONTRAST TECHNIQUE: Multidetector CT imaging of the chest, abdomen and pelvis was performed following the standard protocol during bolus administration of intravenous contrast. CONTRAST:  121mL ISOVUE-300 IOPAMIDOL (ISOVUE-300) INJECTION 61% COMPARISON:  Plain films 01/08/2016. Chest CT 12/15/2015. Abdominopelvic CT 12/29/2015. FINDINGS: CT CHEST FINDINGS Cardiovascular: Aortic and branch vessel atherosclerosis. Tortuous thoracic aorta. Moderate cardiomegaly. Small pericardial effusion is similar to slightly increased since 12/29/2015. No central pulmonary embolism, on this non-dedicated study. Mediastinum/Nodes: Enlargement of a node within the azygoesophageal recess at 1.4 cm on image 26/series 2. Favored to be reactive, given development over the past 3 weeks. No hilar adenopathy. Tiny hiatal hernia. Lungs/Pleura: Small, right greater than left pleural effusions. Mild to moderate degradation secondary to motion and patient arm position, not raised above the head. Bibasilar atelectasis. Musculoskeletal: Lower thoracic  hypoattenuating lesions are likely hemangiomas. CT ABDOMEN PELVIS FINDINGS Hepatobiliary: Hepatomegaly at 22.6 cm. Small gallstones without acute cholecystitis or biliary duct dilatation. Pancreas: Normal, without mass or ductal dilatation.  Spleen: Splenic hypoattenuation is wedge-shaped, primarily superiorly on image 49/series 2. New since the prior. Adrenals/Urinary Tract: Normal adrenal glands. Normal left kidney. New hypoattenuation involving the lower pole right kidney is suspicious for infarct, including on image 75/ series 2. No hydronephrosis. Normal urinary bladder. Stomach/Bowel: Normal remainder of the stomach. Colonic stool burden suggests constipation. Normal small bowel. Vascular/Lymphatic: Aortic and branch vessel atherosclerosis. No abdominopelvic adenopathy. Reproductive: Hysterectomy.  No adnexal mass. Other: No significant free fluid. Musculoskeletal: Lumbosacral spondylosis with degenerative disc disease at the lumbosacral junction. IMPRESSION: 1. multifactorial degradation, especially involving the chest. 2. 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 Suspect a central embolic source. Consider echocardiography. 3. Cholelithiasis. 4. Small bilateral pleural effusions with bibasilar atelectasis. 5. Cardiomegaly with slight increase in small pericardial effusion. 6.  Aortic atherosclerosis. These results will be called to the ordering clinician or representative by the Radiologist Assistant, and communication documented in the PACS or zVision Dashboard. Electronically Signed   By: Abigail Miyamoto M.D.   On: 01/08/2016 17:54   Ct Chest W Contrast  Result Date: 12/15/2015 CLINICAL DATA:  Increased cough, shortness of breath, weakness and decreased appetite over the past few weeks. Loculated pleural effusion. EXAM: CT CHEST WITH CONTRAST TECHNIQUE: Multidetector CT imaging of the chest was performed during intravenous contrast administration. CONTRAST:  16mL ISOVUE-300 IOPAMIDOL  (ISOVUE-300) INJECTION 61% COMPARISON:  05/24/2015. FINDINGS: Cardiovascular: Atherosclerotic calcification of the arterial vasculature. Heart is mildly enlarged. Large pericardial effusion is new from 05/24/2015. Mediastinum/Nodes: Mediastinal lymph nodes are not enlarged by CT size criteria. No hilar or axillary adenopathy. Esophagus is grossly unremarkable. Tiny hiatal hernia. Lungs/Pleura: Mild volume loss in the medial aspects of the right middle lobe and lingula. Minimal dependent atelectasis bilaterally. Lungs are otherwise clear. No pleural fluid. Airway is unremarkable. Upper Abdomen: Visualized portions of the liver, adrenal glands, kidneys, spleen, pancreas, stomach and bowel are grossly unremarkable with exception of a tiny hiatal hernia. Upper abdominal lymph nodes are not enlarged by CT size criteria. Musculoskeletal: No worrisome lytic or sclerotic lesions. Degenerative changes are seen in the spine. IMPRESSION: 1. Large pericardial effusion, new from 05/24/2015. These results will be called to the ordering clinician or representative by the Radiologist Assistant, and communication documented in the PACS or zVision Dashboard. 2.  Aortic atherosclerosis (ICD10-170.0). Electronically Signed   By: Lorin Picket M.D.   On: 12/15/2015 16:08   Ct Abdomen Pelvis W Contrast  Result Date: 01/08/2016 CLINICAL DATA:  Altered mental status. Fever. Cough. Abdominal pain. Diarrhea. Atrial fibrillation on Coumadin. Hypertension. Possible pneumonia or colitis. EXAM: CT CHEST, ABDOMEN, AND PELVIS WITH CONTRAST TECHNIQUE: Multidetector CT imaging of the chest, abdomen and pelvis was performed following the standard protocol during bolus administration of intravenous contrast. CONTRAST:  121mL ISOVUE-300 IOPAMIDOL (ISOVUE-300) INJECTION 61% COMPARISON:  Plain films 01/08/2016. Chest CT 12/15/2015. Abdominopelvic CT 12/29/2015. FINDINGS: CT CHEST FINDINGS Cardiovascular: Aortic and branch vessel atherosclerosis.  Tortuous thoracic aorta. Moderate cardiomegaly. Small pericardial effusion is similar to slightly increased since 12/29/2015. No central pulmonary embolism, on this non-dedicated study. Mediastinum/Nodes: Enlargement of a node within the azygoesophageal recess at 1.4 cm on image 26/series 2. Favored to be reactive, given development over the past 3 weeks. No hilar adenopathy. Tiny hiatal hernia. Lungs/Pleura: Small, right greater than left pleural effusions. Mild to moderate degradation secondary to motion and patient arm position, not raised above the head. Bibasilar atelectasis. Musculoskeletal: Lower thoracic hypoattenuating lesions are likely hemangiomas. CT ABDOMEN PELVIS FINDINGS Hepatobiliary: Hepatomegaly at 22.6 cm. Small gallstones  without acute cholecystitis or biliary duct dilatation. Pancreas: Normal, without mass or ductal dilatation. Spleen: Splenic hypoattenuation is wedge-shaped, primarily superiorly on image 49/series 2. New since the prior. Adrenals/Urinary Tract: Normal adrenal glands. Normal left kidney. New hypoattenuation involving the lower pole right kidney is suspicious for infarct, including on image 75/ series 2. No hydronephrosis. Normal urinary bladder. Stomach/Bowel: Normal remainder of the stomach. Colonic stool burden suggests constipation. Normal small bowel. Vascular/Lymphatic: Aortic and branch vessel atherosclerosis. No abdominopelvic adenopathy. Reproductive: Hysterectomy.  No adnexal mass. Other: No significant free fluid. Musculoskeletal: Lumbosacral spondylosis with degenerative disc disease at the lumbosacral junction. IMPRESSION: 1. multifactorial degradation, especially involving the chest. 2. 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 Suspect a central embolic source. Consider echocardiography. 3. Cholelithiasis. 4. Small bilateral pleural effusions with bibasilar atelectasis. 5. Cardiomegaly with slight increase in small pericardial effusion. 6.  Aortic  atherosclerosis. These results will be called to the ordering clinician or representative by the Radiologist Assistant, and communication documented in the PACS or zVision Dashboard. Electronically Signed   By: Abigail Miyamoto M.D.   On: 01/08/2016 17:54   Ct Abdomen Pelvis W Contrast  Result Date: 12/29/2015 CLINICAL DATA:  Chronic AFib with worsening fatigue in diarrhea EXAM: CT ABDOMEN AND PELVIS WITH CONTRAST TECHNIQUE: Multidetector CT imaging of the abdomen and pelvis was performed using the standard protocol following bolus administration of intravenous contrast. CONTRAST:  144mL ISOVUE-300 IOPAMIDOL (ISOVUE-300) INJECTION 61% COMPARISON:  05/25/2015 ultrasound, CT 04/08/2010 FINDINGS: Lower chest: Small bilateral right greater than left pleural effusion. Passive atelectasis in the bilateral lower lobes. 2.5 cm cyst or bleb in the right lower lobe. Small bleb posterior left lower lobe. There is cardiomegaly with biatrial enlargement. Small pericardial effusion. Small nodular focus right middle lobe, possibly inflammatory or related to atelectasis. Hepatobiliary: Mild diffuse decreased density of the hepatic parenchyma consistent with fatty change. No focal hepatic abnormality. The gallbladder is contracted and filled with hyperdense sludge or calcified stones. No biliary dilatation. Pancreas: Unremarkable. No pancreatic ductal dilatation or surrounding inflammatory changes. Spleen: Normal in size without focal abnormality. Adrenals/Urinary Tract: Adrenal glands are within normal limits. Subcentimeter cortical hypodense lesions in the kidneys, too small to further characterize. No hydronephrosis. Bladder is unremarkable Stomach/Bowel: Stomach is nonenlarged. No dilated small bowel to suggest an obstruction. There is no colon wall thickening. The appendix is not well identified but no right lower quadrant inflammation is seen. Vascular/Lymphatic: Aortic atherosclerosis. No enlarged abdominal or pelvic lymph  nodes. Reproductive: Status post hysterectomy. No adnexal masses. Other: Trace amount of fluid in the pelvis.  No free air. Musculoskeletal: Degenerative changes of the spine. Probable hemangioma in T11. IMPRESSION: 1. No evidence for bowel obstruction. No significant colon wall thickening or colon inflammation. 2. Small bilateral pleural effusions, right greater than left. 3. Cardiomegaly with biatrial enlargement. Small pericardial effusion. 4. Mild fatty infiltration of the liver 5. Contracted gallbladder filled with calcified stones Electronically Signed   By: Donavan Foil M.D.   On: 12/29/2015 21:02   Dg Chest Port 1 View  Result Date: 01/09/2016 CLINICAL DATA:  75 year old female with sepsis. EXAM: PORTABLE CHEST 1 VIEW COMPARISON:  Chest radiograph dated 01/08/2016 and CT dated 01/08/2016 FINDINGS: There are small bilateral pleural effusions, left greater right and bibasilar hazy densities compatible with atelectasis versus infiltrate. There is moderate cardiomegaly. No pneumothorax. Osteopenia with degenerative changes of the spine. No acute osseous pathology. IMPRESSION: Small bilateral pleural effusions and bibasilar atelectasis versus infiltrate. Moderate cardiomegaly. Electronically Signed  By: Anner Crete M.D.   On: 01/09/2016 06:24   Dg Chest Port 1 View  Result Date: 12/16/2015 CLINICAL DATA:  Pericardiocentesis for pericardial effusion. EXAM: PORTABLE CHEST 1 VIEW COMPARISON:  CT from 12/15/2015 FINDINGS: There is a catheter noted along the base of the heart with the tip in head the right cardiophrenic angle and base of heart. Mild interstitial prominence is noted without pneumothorax. The cardiac silhouette is enlarged. There is mild aortic atherosclerosis. No pneumonic consolidation or suspicious osseous lesions. IMPRESSION: Catheter tip is seen along the base the heart to the right of midline. Enlarged cardiac silhouette is noted. There is aortic atherosclerosis. No pneumothorax  is identified. Electronically Signed   By: [name] Royalty M.D.   On: 12/16/2015 19:24   Dg Abd Acute W/chest  Result Date: 01/08/2016 CLINICAL DATA:  Abdominal pain, weakness, nausea/vomiting EXAM: DG ABDOMEN ACUTE W/ 1V CHEST COMPARISON:  CT abdomen/pelvis dated 12/29/2015 FINDINGS: Small bilateral pleural effusions. Possible mild perihilar edema. Mild left lower lobe opacity, likely atelectasis. Cardiomegaly. Nonobstructive bowel gas pattern. No evidence of free air on the lateral decubitus view. Mild degenerative changes the visualized thoracolumbar spine. IMPRESSION: Cardiomegaly with possible mild perihilar edema. Small bilateral pleural effusions. Mild left lower lobe opacity, likely atelectasis. No evidence of small bowel obstruction or free air. Electronically Signed   By: Julian Hy M.D.   On: 01/08/2016 13:48    Linsey Arteaga, DO  [facility] Pager [phone]  If 7PM-7AM, please contact night-coverage [email or web address] Password [email or web address] 01/13/2016, 8:54 AM   LOS: 5 days

## 2016-01-13 NOTE — Progress Notes (Signed)
ANTICOAGULATION CONSULT NOTE - Follow Up Consult  Pharmacy Consult for warfarin Indication: hx atrial fibrillation; new renal and splenic infarcts  Patient Measurements: Height: 5\' 3"  (160 cm) Weight: 143 lb 11.8 oz (65.2 kg) IBW/kg (Calculated) : 52.4 Heparin Dosing Weight: 66 kg  Vital Signs: Temp: 98.7 F (37.1 C) (12/28 0803) Temp Source: Oral (12/28 0803) BP: 120/54 (12/28 0800) Pulse Rate: 123 (12/28 0800)  Labs:  Recent Labs  01/10/16 1310  01/11/16 0335 01/12/16 0401 01/13/16 0332  HGB  --   < > 10.3* 10.0* 10.4*  HCT  --   --  31.1* 30.8* 31.8*  PLT  --   --  213 234 297  LABPROT  --   --  25.1* 29.4* 28.8*  INR  --   --  2.24 2.72 2.65  HEPARINUNFRC 0.24*  --  0.36 0.13*  --   CREATININE  --   --  0.63 0.56 0.60  < > = values in this interval not displayed.  Estimated Creatinine Clearance: 56 mL/min (by C-G formula based on SCr of 0.6 mg/dL).   Medications:  Last warfarin regimen (per [facility] clinic note on 11/10): 5 mg daily except 2.5 mg on Wednesday  Infusions: . sodium chloride 10 mL/hr at 01/12/16 0400     Assessment: Patient is a 75 y.o F with hx pericardial effusion in in November 2017 (s/p Pericardiocentesis and pericardial drain) and afib on warfarin PTA.  Outpatient cardiology note on 12/19 indicated that warfarin was placed on hold with plan to resume "during the first week of January" d/t bloody pericardial effusion.  She presented to the ED in 12/23 with c/o fatigue, hallucination and nausea.  Abd CT on 12/23 showed new renal splenic and right renal infarcts.  Heparin started and warfarin resumed on 12/23 for afib and infarcts.    Today, 01/13/2016: - INR remains therapeutic at 2.65 (quickly increased after warfarin 7.5mg , 5mg , held, 2mg , 0.5 mg) - Hgb stable at 10.4 today, Plts wnl.   - No bleeding or complications reported.  Some bruising noted around IV sites. - 12/24 ECHO without pericardial effusion. - Drug-drug intxns: flagyl (last dose on  12/28 PM), cefepime/vancomycin (d/c on 12/28) can increase INR   Goal of Therapy:  INR 2-3 Monitor platelets by anticoagulation protocol: Yes   Plan:   Warfarin 1 mg today, low dose given quick INR rise and ongoing interaction with Flagyl.  Monitor for s/s of bleeding  Daily heparin level, CBC, PT/INR  Gretta Arab PharmD, BCPS Pager [phone] 01/13/2016 9:12 AM

## 2016-01-13 NOTE — Progress Notes (Signed)
Per medical director, pt is to be made [place] (High Risk for Readmission) and will be set up with [facility] for [facility] services. [facility] rep aware and will need HH orders at discharge. CM will continue to follow. Marney Doctor RN,BSN,NCM [phone]

## 2016-01-13 NOTE — Progress Notes (Signed)
Patient Name: [name] Date of Encounter: 01/13/2016  Primary Cardiologist: Dr. Marinda Elk Problem List     Principal Problem:   Sepsis [facility]) Active Problems:   Long term current use of anticoagulant therapy   Hypertension   Hyperlipidemia   QT prolongation   Rheumatoid arthritis (HCC)   Pleuritic chest pain   Chronic atrial fibrillation (HCC)   Pulmonary hypertension   Pericardial effusion   Diarrhea   Acute on chronic diastolic CHF (congestive heart failure) ([place])   Immunosuppressed status (HCC)   Splenic infarct   Renal infarct (HCC)    Subjective   Feeling somewhat better, just a little "washed out." Notes ongoing dry cough. No chest pain. C/o feeling like gas in lower abdomen, last BM this AM (nonbloody). HR currently around 105 but has varied from 40s overnight to 120s.  Inpatient Medications    . acidophilus  1 capsule Oral Daily  . chlorhexidine  15 mL Mouth Rinse BID  . cholecalciferol  2,000 Units Oral Daily  . folic acid  2 mg Oral Daily  . levalbuterol  0.63 mg Nebulization TID  . mouth rinse  15 mL Mouth Rinse q12n4p  . metoprolol succinate  100 mg Oral BID  . metroNIDAZOLE  500 mg Oral TID  . multivitamin with minerals  1 tablet Oral Daily  . pravastatin  20 mg Oral QHS  . predniSONE  5 mg Oral Q breakfast  . vancomycin  1,000 mg Intravenous Q12H  . verapamil  120 mg Oral Q12H  . Warfarin - Pharmacist Dosing Inpatient   Does not apply q1800    Vital Signs    Vitals:   01/13/16 0400 01/13/16 0500 01/13/16 0600 01/13/16 0700  BP: 100/65  106/68   Pulse: 95 (!) 111 (!) 106 (!) 119  Resp: 20 20 (!) 26 (!) 25  Temp:      TempSrc:      SpO2: 96% 96% 94% 94%  Weight:      Height:        Intake/Output Summary (Last 24 hours) at 01/13/16 0800 Last data filed at 01/13/16 0600  Gross per 24 hour  Intake              320 ml  Output             2750 ml  Net            -2430 ml   Filed Weights   01/08/16 1800 01/12/16 0408  01/13/16 0100  Weight: 145 lb 8.1 oz (66 kg) 152 lb 5.4 oz (69.1 kg) 143 lb 11.8 oz (65.2 kg)    Physical Exam    General: Well developed, well nourished WF in no acute distress. HEENT: Normocephalic, atraumatic, sclera non-icteric, no xanthomas, nares are without discharge. Neck: Negative for carotid bruits. JVP not elevated. Lungs: Decreased BS at bases bilaterally. Breathing is unlabored. Cardiac: Irregularly, borderline tachycardic, S1 S2 without murmurs, rubs, or gallops.  Abdomen: Soft, non-tender, non-distended with normoactive bowel sounds. No rebound/guarding. Extremities: No clubbing or cyanosis. No edema. Distal pedal pulses are 2+ and equal bilaterally. Skin: Warm and dry, no significant rash. Neuro: Alert and oriented X 3. Sensation in tact. Follows commands. Psych:  Responds to questions appropriately with a normal affect.  Labs    CBC  Recent Labs  01/12/16 0401 01/13/16 0332  WBC 8.5 8.0  HGB 10.0* 10.4*  HCT 30.8* 31.8*  MCV 91.4 90.6  PLT 234 297   Basic  Metabolic Panel  Recent Labs  01/11/16 0335 01/12/16 0401 01/13/16 0332  NA 137 137 141  K 3.3* 3.3* 3.9  CL 108 109 108  CO2 25 24 26   GLUCOSE 132* 93 125*  BUN 11 11 8   CREATININE 0.63 0.56 0.60  CALCIUM 8.4* 8.1* 8.7*  MG 2.0 1.9 2.0  PHOS 2.9 3.0  --    Liver Function Tests  Recent Labs  01/11/16 0335  AST 26  ALT 22  ALKPHOS 53  BILITOT 0.5  PROT 5.7*  ALBUMIN 2.7*   Telemetry    Atrial fib. HR currently low 100s. Overnight was down to the 40's (occ pauses <3 sec); waking hours 100-120s (except she did have bradycardia around noon yesterday as well - patient thinks she may have been napping at that time)  Radiology    Dg Chest 1 View  Result Date: 01/10/2016 CLINICAL DATA:  Cough. EXAM: CHEST 1 VIEW COMPARISON:  01/09/2016 FINDINGS: Enlargement of the cardiac silhouette is unchanged. Lung volumes are diminished, more so than on the prior study. There is peribronchial cuffing  and diffuse interstitial accentuation which have increased from the prior study. Patchy opacities in both lung bases have also increased. Small pleural effusions persist. No pneumothorax is seen. No acute osseous abnormality is identified. IMPRESSION: 1. Cardiomegaly with likely mild interstitial edema and small pleural effusions. 2. Low lung volumes with increased bibasilar opacities, likely atelectasis. Electronically Signed   By: Logan Bores M.D.   On: 01/10/2016 06:42   Dg Chest 2 View  Result Date: 12/27/2015 CLINICAL DATA:  Fever shin EXAM: CHEST  2 VIEW COMPARISON:  12/16/2015 FINDINGS: Cardiac shadow remains enlarged. The lungs are well aerated bilaterally. No focal infiltrate or sizable effusion is seen. Previously seen pericardial catheter has been removed. Degenerative change of the thoracic spine is noted. IMPRESSION: No active cardiopulmonary disease. Electronically Signed   By: Inez Catalina M.D.   On: 12/27/2015 21:23   Ct Head Wo Contrast  Result Date: 01/08/2016 CLINICAL DATA:  Fever with altered mental status. EXAM: CT HEAD WITHOUT CONTRAST TECHNIQUE: Contiguous axial images were obtained from the base of the skull through the vertex without intravenous contrast. COMPARISON:  12/28/2015 FINDINGS: Brain: No evidence of acute infarction, hemorrhage, hydrocephalus, extra-axial collection or mass lesion/mass effect. Stable mild brain parenchymal atrophy and microangiopathic changes in the deep white matter. Vascular: No hyperdense vessel or unexpected calcification. Skull: Normal. Negative for fracture or focal lesion. Sinuses/Orbits: No acute finding. Other: None. IMPRESSION: No acute intracranial abnormality. Stable mild brain parenchymal atrophy and chronic microvascular disease. Electronically Signed   By: Fidela Salisbury M.D.   On: 01/08/2016 17:33   Ct Head Wo Contrast  Result Date: 12/28/2015 CLINICAL DATA:  75 year old female with fall.  Generalized weakness. EXAM: CT HEAD  WITHOUT CONTRAST TECHNIQUE: Contiguous axial images were obtained from the base of the skull through the vertex without intravenous contrast. COMPARISON:  Head CT dated 05/23/2015 FINDINGS: Brain: The ventricles and sulci are appropriate in size for patient's age. Mild periventricular and deep white matter chronic microvascular ischemic changes noted. There is no acute intracranial hemorrhage. No mass effect or midline shift noted. Vascular: No hyperdense vessel or unexpected calcification. Skull: Normal. Negative for fracture or focal lesion. Sinuses/Orbits: No acute finding. Other: None IMPRESSION: No acute intracranial pathology. Mild age-related atrophy and chronic microvascular ischemic changes. Electronically Signed   By: Anner Crete M.D.   On: 12/28/2015 02:04   Ct Chest W Contrast  Result Date: 01/08/2016 CLINICAL  DATA:  Altered mental status. Fever. Cough. Abdominal pain. Diarrhea. Atrial fibrillation on Coumadin. Hypertension. Possible pneumonia or colitis. EXAM: CT CHEST, ABDOMEN, AND PELVIS WITH CONTRAST TECHNIQUE: Multidetector CT imaging of the chest, abdomen and pelvis was performed following the standard protocol during bolus administration of intravenous contrast. CONTRAST:  160mL ISOVUE-300 IOPAMIDOL (ISOVUE-300) INJECTION 61% COMPARISON:  Plain films 01/08/2016. Chest CT 12/15/2015. Abdominopelvic CT 12/29/2015. FINDINGS: CT CHEST FINDINGS Cardiovascular: Aortic and branch vessel atherosclerosis. Tortuous thoracic aorta. Moderate cardiomegaly. Small pericardial effusion is similar to slightly increased since 12/29/2015. No central pulmonary embolism, on this non-dedicated study. Mediastinum/Nodes: Enlargement of a node within the azygoesophageal recess at 1.4 cm on image 26/series 2. Favored to be reactive, given development over the past 3 weeks. No hilar adenopathy. Tiny hiatal hernia. Lungs/Pleura: Small, right greater than left pleural effusions. Mild to moderate degradation  secondary to motion and patient arm position, not raised above the head. Bibasilar atelectasis. Musculoskeletal: Lower thoracic hypoattenuating lesions are likely hemangiomas. CT ABDOMEN PELVIS FINDINGS Hepatobiliary: Hepatomegaly at 22.6 cm. Small gallstones without acute cholecystitis or biliary duct dilatation. Pancreas: Normal, without mass or ductal dilatation. Spleen: Splenic hypoattenuation is wedge-shaped, primarily superiorly on image 49/series 2. New since the prior. Adrenals/Urinary Tract: Normal adrenal glands. Normal left kidney. New hypoattenuation involving the lower pole right kidney is suspicious for infarct, including on image 75/ series 2. No hydronephrosis. Normal urinary bladder. Stomach/Bowel: Normal remainder of the stomach. Colonic stool burden suggests constipation. Normal small bowel. Vascular/Lymphatic: Aortic and branch vessel atherosclerosis. No abdominopelvic adenopathy. Reproductive: Hysterectomy.  No adnexal mass. Other: No significant free fluid. Musculoskeletal: Lumbosacral spondylosis with degenerative disc disease at the lumbosacral junction. IMPRESSION: 1. multifactorial degradation, especially involving the chest. 2. 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 Suspect a central embolic source. Consider echocardiography. 3. Cholelithiasis. 4. Small bilateral pleural effusions with bibasilar atelectasis. 5. Cardiomegaly with slight increase in small pericardial effusion. 6.  Aortic atherosclerosis. These results will be called to the ordering clinician or representative by the Radiologist Assistant, and communication documented in the PACS or zVision Dashboard. Electronically Signed   By: Abigail Miyamoto M.D.   On: 01/08/2016 17:54   Ct Chest W Contrast  Result Date: 12/15/2015 CLINICAL DATA:  Increased cough, shortness of breath, weakness and decreased appetite over the past few weeks. Loculated pleural effusion. EXAM: CT CHEST WITH CONTRAST TECHNIQUE: Multidetector  CT imaging of the chest was performed during intravenous contrast administration. CONTRAST:  27mL ISOVUE-300 IOPAMIDOL (ISOVUE-300) INJECTION 61% COMPARISON:  05/24/2015. FINDINGS: Cardiovascular: Atherosclerotic calcification of the arterial vasculature. Heart is mildly enlarged. Large pericardial effusion is new from 05/24/2015. Mediastinum/Nodes: Mediastinal lymph nodes are not enlarged by CT size criteria. No hilar or axillary adenopathy. Esophagus is grossly unremarkable. Tiny hiatal hernia. Lungs/Pleura: Mild volume loss in the medial aspects of the right middle lobe and lingula. Minimal dependent atelectasis bilaterally. Lungs are otherwise clear. No pleural fluid. Airway is unremarkable. Upper Abdomen: Visualized portions of the liver, adrenal glands, kidneys, spleen, pancreas, stomach and bowel are grossly unremarkable with exception of a tiny hiatal hernia. Upper abdominal lymph nodes are not enlarged by CT size criteria. Musculoskeletal: No worrisome lytic or sclerotic lesions. Degenerative changes are seen in the spine. IMPRESSION: 1. Large pericardial effusion, new from 05/24/2015. These results will be called to the ordering clinician or representative by the Radiologist Assistant, and communication documented in the PACS or zVision Dashboard. 2.  Aortic atherosclerosis (ICD10-170.0). Electronically Signed   By: Lorin Picket M.D.   On:  12/15/2015 16:08   Ct Abdomen Pelvis W Contrast  Result Date: 01/08/2016 CLINICAL DATA:  Altered mental status. Fever. Cough. Abdominal pain. Diarrhea. Atrial fibrillation on Coumadin. Hypertension. Possible pneumonia or colitis. EXAM: CT CHEST, ABDOMEN, AND PELVIS WITH CONTRAST TECHNIQUE: Multidetector CT imaging of the chest, abdomen and pelvis was performed following the standard protocol during bolus administration of intravenous contrast. CONTRAST:  152mL ISOVUE-300 IOPAMIDOL (ISOVUE-300) INJECTION 61% COMPARISON:  Plain films 01/08/2016. Chest CT  12/15/2015. Abdominopelvic CT 12/29/2015. FINDINGS: CT CHEST FINDINGS Cardiovascular: Aortic and branch vessel atherosclerosis. Tortuous thoracic aorta. Moderate cardiomegaly. Small pericardial effusion is similar to slightly increased since 12/29/2015. No central pulmonary embolism, on this non-dedicated study. Mediastinum/Nodes: Enlargement of a node within the azygoesophageal recess at 1.4 cm on image 26/series 2. Favored to be reactive, given development over the past 3 weeks. No hilar adenopathy. Tiny hiatal hernia. Lungs/Pleura: Small, right greater than left pleural effusions. Mild to moderate degradation secondary to motion and patient arm position, not raised above the head. Bibasilar atelectasis. Musculoskeletal: Lower thoracic hypoattenuating lesions are likely hemangiomas. CT ABDOMEN PELVIS FINDINGS Hepatobiliary: Hepatomegaly at 22.6 cm. Small gallstones without acute cholecystitis or biliary duct dilatation. Pancreas: Normal, without mass or ductal dilatation. Spleen: Splenic hypoattenuation is wedge-shaped, primarily superiorly on image 49/series 2. New since the prior. Adrenals/Urinary Tract: Normal adrenal glands. Normal left kidney. New hypoattenuation involving the lower pole right kidney is suspicious for infarct, including on image 75/ series 2. No hydronephrosis. Normal urinary bladder. Stomach/Bowel: Normal remainder of the stomach. Colonic stool burden suggests constipation. Normal small bowel. Vascular/Lymphatic: Aortic and branch vessel atherosclerosis. No abdominopelvic adenopathy. Reproductive: Hysterectomy.  No adnexal mass. Other: No significant free fluid. Musculoskeletal: Lumbosacral spondylosis with degenerative disc disease at the lumbosacral junction. IMPRESSION: 1. multifactorial degradation, especially involving the chest. 2. 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 Suspect a central embolic source. Consider echocardiography. 3. Cholelithiasis. 4. Small bilateral  pleural effusions with bibasilar atelectasis. 5. Cardiomegaly with slight increase in small pericardial effusion. 6.  Aortic atherosclerosis. These results will be called to the ordering clinician or representative by the Radiologist Assistant, and communication documented in the PACS or zVision Dashboard. Electronically Signed   By: Abigail Miyamoto M.D.   On: 01/08/2016 17:54   Ct Abdomen Pelvis W Contrast  Result Date: 12/29/2015 CLINICAL DATA:  Chronic AFib with worsening fatigue in diarrhea EXAM: CT ABDOMEN AND PELVIS WITH CONTRAST TECHNIQUE: Multidetector CT imaging of the abdomen and pelvis was performed using the standard protocol following bolus administration of intravenous contrast. CONTRAST:  126mL ISOVUE-300 IOPAMIDOL (ISOVUE-300) INJECTION 61% COMPARISON:  05/25/2015 ultrasound, CT 04/08/2010 FINDINGS: Lower chest: Small bilateral right greater than left pleural effusion. Passive atelectasis in the bilateral lower lobes. 2.5 cm cyst or bleb in the right lower lobe. Small bleb posterior left lower lobe. There is cardiomegaly with biatrial enlargement. Small pericardial effusion. Small nodular focus right middle lobe, possibly inflammatory or related to atelectasis. Hepatobiliary: Mild diffuse decreased density of the hepatic parenchyma consistent with fatty change. No focal hepatic abnormality. The gallbladder is contracted and filled with hyperdense sludge or calcified stones. No biliary dilatation. Pancreas: Unremarkable. No pancreatic ductal dilatation or surrounding inflammatory changes. Spleen: Normal in size without focal abnormality. Adrenals/Urinary Tract: Adrenal glands are within normal limits. Subcentimeter cortical hypodense lesions in the kidneys, too small to further characterize. No hydronephrosis. Bladder is unremarkable Stomach/Bowel: Stomach is nonenlarged. No dilated small bowel to suggest an obstruction. There is no colon wall thickening. The appendix is not well  identified but no  right lower quadrant inflammation is seen. Vascular/Lymphatic: Aortic atherosclerosis. No enlarged abdominal or pelvic lymph nodes. Reproductive: Status post hysterectomy. No adnexal masses. Other: Trace amount of fluid in the pelvis.  No free air. Musculoskeletal: Degenerative changes of the spine. Probable hemangioma in T11. IMPRESSION: 1. No evidence for bowel obstruction. No significant colon wall thickening or colon inflammation. 2. Small bilateral pleural effusions, right greater than left. 3. Cardiomegaly with biatrial enlargement. Small pericardial effusion. 4. Mild fatty infiltration of the liver 5. Contracted gallbladder filled with calcified stones Electronically Signed   By: Donavan Foil M.D.   On: 12/29/2015 21:02   Dg Chest Port 1 View  Result Date: 01/09/2016 CLINICAL DATA:  75 year old female with sepsis. EXAM: PORTABLE CHEST 1 VIEW COMPARISON:  Chest radiograph dated 01/08/2016 and CT dated 01/08/2016 FINDINGS: There are small bilateral pleural effusions, left greater right and bibasilar hazy densities compatible with atelectasis versus infiltrate. There is moderate cardiomegaly. No pneumothorax. Osteopenia with degenerative changes of the spine. No acute osseous pathology. IMPRESSION: Small bilateral pleural effusions and bibasilar atelectasis versus infiltrate. Moderate cardiomegaly. Electronically Signed   By: Anner Crete M.D.   On: 01/09/2016 06:24   Dg Chest Port 1 View  Result Date: 12/16/2015 CLINICAL DATA:  Pericardiocentesis for pericardial effusion. EXAM: PORTABLE CHEST 1 VIEW COMPARISON:  CT from 12/15/2015 FINDINGS: There is a catheter noted along the base of the heart with the tip in head the right cardiophrenic angle and base of heart. Mild interstitial prominence is noted without pneumothorax. The cardiac silhouette is enlarged. There is mild aortic atherosclerosis. No pneumonic consolidation or suspicious osseous lesions. IMPRESSION: Catheter tip is seen along the  base the heart to the right of midline. Enlarged cardiac silhouette is noted. There is aortic atherosclerosis. No pneumothorax is identified. Electronically Signed   By: [name] Royalty M.D.   On: 12/16/2015 19:24   Dg Abd Acute W/chest  Result Date: 01/08/2016 CLINICAL DATA:  Abdominal pain, weakness, nausea/vomiting EXAM: DG ABDOMEN ACUTE W/ 1V CHEST COMPARISON:  CT abdomen/pelvis dated 12/29/2015 FINDINGS: Small bilateral pleural effusions. Possible mild perihilar edema. Mild left lower lobe opacity, likely atelectasis. Cardiomegaly. Nonobstructive bowel gas pattern. No evidence of free air on the lateral decubitus view. Mild degenerative changes the visualized thoracolumbar spine. IMPRESSION: Cardiomegaly with possible mild perihilar edema. Small bilateral pleural effusions. Mild left lower lobe opacity, likely atelectasis. No evidence of small bowel obstruction or free air. Electronically Signed   By: Julian Hy M.D.   On: 01/08/2016 13:48     Patient Profile     75 yo female w/ hx HTN, HLD, Ra, mild pHTN, nl cors and EF by cath 2012, chronicatrial fibrillation, prolonged QT, pericardial effusion s/p pericardiocentesis/pericardial drain 11/2015 (dark sanguinous/bloody fluid, path neg for malignancy) from recurrent pneumonias so off Coumadin (with initial plan to resume 2-4 weeks around 01/17/16, intolerant of colchicine due to diarrhea). Admitted 12/11-12/17 SIRS &C diff, cardiology followed for rate control. Readmitted 01/08/16 w/ AMS, fevers, diarrhea, cough-non productive, sepsis (possibly from HCAP vs GI illness). Cards consulted for renal and splenic infarcts seen on CT. Now on heparin>>coumadin. Hospitalization also notable for elevated HR, low BP, acute on chronic diastolic CHF, and QT prolongation (known). 2D echo 01/09/16: EF 50-55%, mild LAE, mild MR/TR, PASP 40, no pericardial effusion.  Assessment & Plan    1. Sepsis - per IM. She also notes feeling of "gas" in lower abdomen,  normal BM this AM. Will defer further evaluation to internal med. ?  Consider consultation to GI.  2. Chronic atrial fib with evidence of tachy/brady on telemetry - now back on Coumadin. Elevated HR persists, may be related to overall medical illness. Given the intermittent bradycardia on telemetry, will review regimen further with cardiology MD.  3. Acute on chronic diastolic CHF - appears euvolemic on exam, weight back near baseline. Follow off diuretics. Add strict I/O.  4. Pericardial effusion - improved by 2D echo 01/13/16. Will review with MD whether it would be worthwhile to repeat since she's been back on anticoagulation for several days.  5. Splenic/renal infarcts on CT - now therapeutic on Coumadin again.  Signed, Charlie Pitter, PA-C  01/13/2016, 8:00 AM

## 2016-01-14 ENCOUNTER — Inpatient Hospital Stay (HOSPITAL_COMMUNITY): Payer: Medicare Other

## 2016-01-14 DIAGNOSIS — I319 Disease of pericardium, unspecified: Secondary | ICD-10-CM

## 2016-01-14 LAB — BASIC METABOLIC PANEL
Anion gap: 6 (ref 5–15)
BUN: 11 mg/dL (ref 6–20)
CALCIUM: 8.3 mg/dL — AB (ref 8.9–10.3)
CO2: 24 mmol/L (ref 22–32)
CREATININE: 0.63 mg/dL (ref 0.44–1.00)
Chloride: 108 mmol/L (ref 101–111)
Glucose, Bld: 97 mg/dL (ref 65–99)
Potassium: 3.5 mmol/L (ref 3.5–5.1)
SODIUM: 138 mmol/L (ref 135–145)

## 2016-01-14 LAB — CBC
HEMATOCRIT: 34.5 % — AB (ref 36.0–46.0)
Hemoglobin: 11.4 g/dL — ABNORMAL LOW (ref 12.0–15.0)
MCH: 29.7 pg (ref 26.0–34.0)
MCHC: 33 g/dL (ref 30.0–36.0)
MCV: 89.8 fL (ref 78.0–100.0)
PLATELETS: 380 10*3/uL (ref 150–400)
RBC: 3.84 MIL/uL — ABNORMAL LOW (ref 3.87–5.11)
RDW: 16 % — AB (ref 11.5–15.5)
WBC: 9.2 10*3/uL (ref 4.0–10.5)

## 2016-01-14 LAB — ECHOCARDIOGRAM LIMITED
HEIGHTINCHES: 63 in
Weight: 2278.67 oz

## 2016-01-14 LAB — CULTURE, BLOOD (ROUTINE X 2)
CULTURE: NO GROWTH
Culture: NO GROWTH

## 2016-01-14 LAB — PROTIME-INR
INR: 2.18
PROTHROMBIN TIME: 24.7 s — AB (ref 11.4–15.2)

## 2016-01-14 MED ORDER — POTASSIUM CHLORIDE CRYS ER 20 MEQ PO TBCR
20.0000 meq | EXTENDED_RELEASE_TABLET | Freq: Once | ORAL | Status: AC
  Administered 2016-01-14: 20 meq via ORAL
  Filled 2016-01-14: qty 1

## 2016-01-14 MED ORDER — WARFARIN SODIUM 2.5 MG PO TABS
2.5000 mg | ORAL_TABLET | Freq: Once | ORAL | Status: AC
  Administered 2016-01-14: 2.5 mg via ORAL
  Filled 2016-01-14: qty 1

## 2016-01-14 NOTE — Progress Notes (Signed)
ANTICOAGULATION CONSULT NOTE - Follow Up Consult  Pharmacy Consult for warfarin Indication: hx atrial fibrillation; new renal and splenic infarcts  Patient Measurements: Height: 5\' 3"  (160 cm) Weight: 142 lb 6.7 oz (64.6 kg) IBW/kg (Calculated) : 52.4 Heparin Dosing Weight: 66 kg  Vital Signs: Temp: 98.1 F (36.7 C) (12/29 0802) Temp Source: Oral (12/29 0802) BP: 119/75 (12/29 0812) Pulse Rate: 118 (12/29 0900)  Labs:  Recent Labs  01/12/16 0401 01/13/16 0332 01/14/16 0330  HGB 10.0* 10.4* 11.4*  HCT 30.8* 31.8* 34.5*  PLT 234 297 380  LABPROT 29.4* 28.8* 24.7*  INR 2.72 2.65 2.18  HEPARINUNFRC 0.13*  --   --   CREATININE 0.56 0.60 0.63    Estimated Creatinine Clearance: 55.8 mL/min (by C-G formula based on SCr of 0.63 mg/dL).   Medications:  Last warfarin regimen (per [facility] clinic note on 11/10): 5 mg daily except 2.5 mg on Wednesday  Infusions: . sodium chloride Stopped (01/14/16 0800)     Assessment: Patient is a 75 y.o F with hx pericardial effusion in in November 2017 (s/p Pericardiocentesis and pericardial drain) and afib on warfarin PTA.  Outpatient cardiology note on 12/19 indicated that warfarin was placed on hold with plan to resume "during the first week of January" d/t bloody pericardial effusion.  She presented to the ED in 12/23 with c/o fatigue, hallucination and nausea.  Abd CT on 12/23 showed new renal splenic and right renal infarcts.  Heparin started and warfarin resumed on 12/23 for afib and infarcts.    Today, 01/14/2016: - INR therapeutic at 2.18, quickly increased after warfarin 7.5 mg, 5 mg doses and has been responding to lower doses since. May be able to tolerate higher doses once drug interacting medications discontinued. - Hgb stable, Plts wnl - No bleeding or complications reported.  Some bruising noted around IV sites. - 12/24 ECHO without pericardial effusion. - Drug-drug intxns: flagyl (last dose on 12/28 PM), cefepime/vancomycin (d/c  on 12/28) can increase INR  Goal of Therapy:  INR 2-3 Monitor platelets by anticoagulation protocol: Yes   Plan:   Warfarin 2.5 mg today  Monitor for s/s of bleeding  Daily heparin level, CBC, PT/INR  Hershal Coria, PharmD, BCPS Pager: [phone] 01/14/2016 10:43 AM

## 2016-01-14 NOTE — Progress Notes (Signed)
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 Patient was discharged home and did well the first few days but then states lost her appetite and started coughing and had worsening diarrhea over the course of the week. Patient states she became more fatigued and developed fevers.  The patient continued to have loose stools to the point of having bowel incontinence. No blood in stool noted. Has had fevers and chills at home and has worsened since discharge on 01/02/2016. Per daughter has been so weak and has only lying in bed and not been eating. Hospitalist was called to Admit because of Sepsis. Since admission, the patient's diarrhea had resolved.  The patient finished 5 days of vancomycin and cefepime for possible HCAP.  CT of the abdomen and pelvis at the time of admission revealed new splenic and renal infarcts. The patient was restarted on warfarin which was initially stopped at the end of November secondary to serosanguineous drainage from pericardiocentesis. The patient was also treated with intravenous furosemide for acute on chronic diastolic CHF.  Assessment/Plan: Sepsis from Health Care Acquired Pneumonia vs. Gastrointestinal Illness -Patient is Septic on Admission with Fever of 102, Tachycardia, Tachypnea, Leukocytosis of 18.8 and Lactic Acid Level of 4.83. Likely HCAP vs viral infection -PCT--0.12;  -lactate 4.83--->1.3 -Blood Cx's x2 showed No growth   -Urinalysis Negative  -Chest and Abdominal Imaging X-Ray showed Cardiomegaly with possible mild perihilar edema. Small  bilateral pleural effusions.Mild left lower lobe opacity, likely atelectasis. No evidence of small bowel obstruction or free air. -WBC went from 18.8 ->12.2 ->8.5>>9.2 - finished 5 days vancomycin and cefepime -finished 11 days metronidazole -Follow blood culture, urine culture--neg -CT of Chest/Abd/Pelvis Not revealing infection -fever partly attributable to new splenic and renal infarc--fever resolved -sepsis physiology resolved  Atrial Fibrillation with RVR:  -CHA2DS2-VASc Scoreis 3- Anticoagulation with Coumadin -previously held due to Bloody Pericardial Effusion -Per notes was to be started During the First Week of January; However given CT ABD/PELVIS FINDINGS RESTARTED HEPARIN gtt and Warfarain -INR Now Therapeutic  -D/C Heparin gtt.  -continue Metoprolol and Verapamil  -HR remains 120s but 60s when sleeping--defer to cardiology -Cardiology followup appreciated  Acute on Chronic systolic congestive heart failure: -ECHOon 12/29/15 showed EF of 50-55 %. However previous [facility] showed EF of 40-45%.  -BNP was 302.0 -Strict I's and O's and Daily Weight--was +5.7L -Given IV Lasix 12/25, 12/26, 12/27-->out 5 L -now euvolemic, back to baseline weight (144-145) -am BMP  Diarrhea -Enteric Percautions--discontinued as the patient has not had any diarrhea -CT of Abd/Pelvis showed Splenic and Right Renal Infarcts which are new.  -Stomach/Bowel: Normal remainder of the stomach. Colonic stool burden suggests constipation. Normal small bowel. -GI Stool Panel Negative -resolved  Acute respiratory failure with hypoxia -secondary to PNA and CHF -resolved -now stable on RA  Pericardial Effusion:  -S/p of pericardiocentesis and Pericardial drain placement with removal of 500 mL of Dark Sanguinous Fluid during the Admission of 11/29-12/3. -repeatedechocardiograms without significant fluid accumulation before discharge. Pt did not want Colchicine for Pericarditis -CT of the  Chest showed Cardiomegaly with slight increase in small pericardial effusion. -01/09/16--ECHOCardiogram did not  show Pericardial Effusion.  Hypotension -Bolused  -Continue to Monitor -became fluid overloaded-->lasix -resolved  Visual Hallucinations -Head CT Negative and showed No acute intracranial abnormality. Stable mild brain parenchymal atrophy and chronic microvascular disease. -Avoid Sedatives -resolved  Hypokalemia -Repleted -Repeat BMP in AM -check mag--2.0  Mild Hyponatremia -Improved.  Atypical CP  -Troponin I x 3 Negative -no ACS  Generalized Weakness and Deconditioning -PT/OT Evaluation once HR under better control   Rheumatoid Arthritis:  -On prednisone 2.5 mg daily at home--on stress dose presently -Hold Home Methotrexate and Etenercept. Clinically stable. -double home dose steroid  HLD: -Last LDL was79 on 05/21/13 -Continue home medications:Pravastatin     Disposition Plan: Home when cleared by cardiology Family Communication: No Family at bedside  Consultants: Cardiolpgy  Code Status: FULL   DVT Prophylaxis: warfarin   Procedures: As Listed in Progress Note Above  Antibiotics: Cefepime 01/08/2016>>> 01/12/2016 Vancomycin 01/08/2016>>> 01/12/2016 Metronidazole 01/02/2016>>> 01/13/2016    Subjective: Patient denies fevers, chills, headache, chest pain, dyspnea, nausea, vomiting, diarrhea, abdominal pain, dysuria, hematuria, hematochezia, and melena.   Objective: Vitals:   01/14/16 0700 01/14/16 0802 01/14/16 0812 01/14/16 0836  BP:  (!) 167/103 119/75   Pulse: 86 (!) 121 (!) 123   Resp: 19 (!) 23 20   Temp:      TempSrc:      SpO2: 97% 99% 98% 99%  Weight:      Height:        Intake/Output Summary (Last 24 hours) at 01/14/16 [id number] Last data filed at 01/14/16 0200  Gross per 24 hour  Intake               40 ml  Output              500 ml  Net             -460 ml   Weight change: -0.6 kg (-1 lb  5.2 oz) Exam:   General:  Pt is alert, follows commands appropriately, not in acute distress  HEENT: No icterus, No thrush, No neck mass, [place]/AT  Cardiovascular: RRR, S1/S2, no rubs, no gallops  Respiratory: Bibasilar crackles without wheezing. Good air movement.  Abdomen: Soft/+BS, non tender, non distended, no guarding  Extremities: No edema, No lymphangitis, No petechiae, No rashes, no synovitis   Data Reviewed: I have personally reviewed following labs and imaging studies Basic Metabolic Panel:  Recent Labs Lab 01/10/16 0212 01/11/16 0335 01/12/16 0401 01/13/16 0332 01/14/16 0330  NA 133* 137 137 141 138  K 4.0 3.3* 3.3* 3.9 3.5  CL 104 108 109 108 108  CO2 23 25 24 26 24   GLUCOSE 136* 132* 93 125* 97  BUN 10 11 11 8 11   CREATININE 0.79 0.63 0.56 0.60 0.63  CALCIUM 8.3* 8.4* 8.1* 8.7* 8.3*  MG 1.8 2.0 1.9 2.0  --   PHOS 2.7 2.9 3.0  --   --    Liver Function Tests:  Recent Labs Lab 01/08/16 1257 01/08/16 1848 01/09/16 0609 01/10/16 0212 01/11/16 0335  AST 29 19 42* 28 26  ALT 25 17 28 24 22   ALKPHOS 62 50 55 53 53  BILITOT 0.9 0.9 1.0 0.5 0.5  PROT 7.3 5.8* 6.0* 5.7* 5.7*  ALBUMIN 3.7 2.9* 3.0* 2.8* 2.7*   No results for input(s): LIPASE, AMYLASE in the last 168 hours. No results for input(s): AMMONIA in the last 168 hours. Coagulation Profile:  Recent Labs Lab 01/10/16 0212 01/11/16 0335 01/12/16 0401 01/13/16 0332 01/14/16 0330  INR 1.99 2.24 2.72 2.65 2.18   CBC:  Recent Labs Lab 01/08/16 1848  01/10/16 0212 01/11/16 0335 01/12/16 0401 01/13/16 0332 01/14/16 0330  WBC 16.2*  < > 12.2* 10.2 8.5 8.0 9.2  NEUTROABS 14.6*  --  10.7*  --   --   --   --   HGB 11.9*  < > 10.3* 10.3* 10.0* 10.4* 11.4*  HCT 36.6  < > 31.6* 31.1* 30.8* 31.8* 34.5*  MCV 91.3  < > 91.3 91.5 91.4 90.6 89.8  PLT 263  < > 234 213 234 297 380  < > = values in this interval not displayed. Cardiac Enzymes:  Recent Labs Lab 01/08/16 1848 01/08/16 2304  01/09/16 0609  TROPONINI <0.03 <0.03 <0.03   BNP: Invalid input(s): POCBNP CBG:  Recent Labs Lab 01/08/16 1513  GLUCAP 192*   HbA1C: No results for input(s): HGBA1C in the last 72 hours. Urine analysis:    Component Value Date/Time   COLORURINE AMBER (A) 01/08/2016 1507   APPEARANCEUR TURBID (A) 01/08/2016 1507   LABSPEC 1.014 01/08/2016 1507   PHURINE 8.0 01/08/2016 1507   GLUCOSEU NEGATIVE 01/08/2016 1507   HGBUR NEGATIVE 01/08/2016 1507   BILIRUBINUR NEGATIVE 01/08/2016 1507   KETONESUR NEGATIVE 01/08/2016 1507   PROTEINUR 100 (A) 01/08/2016 1507   UROBILINOGEN 0.2 10/29/2013 1932   NITRITE NEGATIVE 01/08/2016 1507   LEUKOCYTESUR NEGATIVE 01/08/2016 1507   Sepsis Labs: @LABRCNTIP (procalcitonin:4,lacticidven:4) ) Recent Results (from the past 240 hour(s))  Blood culture (routine x 2)     Status: None   Collection Time: 01/08/16  2:14 PM  Result Value Ref Range Status   Specimen Description BLOOD LEFT FOREARM  Final   Special Requests IN PEDIATRIC BOTTLE 5CC  Final   Culture   Final    NO GROWTH 5 DAYS Performed at [facility]    Report Status 01/13/2016 FINAL  Final  Urine culture     Status: None   Collection Time: 01/08/16  3:07 PM  Result Value Ref Range Status   Specimen Description URINE, CATHETERIZED  Final   Special Requests NONE  Final   Culture NO GROWTH Performed at [facility]   Final   Report Status 01/10/2016 FINAL  Final  Blood culture (routine x 2)     Status: None   Collection Time: 01/08/16  3:15 PM  Result Value Ref Range Status   Specimen Description BLOOD RIGHT ANTECUBITAL  Final   Special Requests BOTTLES DRAWN AEROBIC AND ANAEROBIC 5CC  Final   Culture   Final    NO GROWTH 5 DAYS Performed at [facility]    Report Status 01/13/2016 FINAL  Final  Culture, blood (x 2)     Status: None (Preliminary result)   Collection Time: 01/08/16  6:48 PM  Result Value Ref Range Status   Specimen Description BLOOD LEFT  ANTECUBITAL  Final   Special Requests IN PEDIATRIC BOTTLE [place]  Final   Culture   Final    NO GROWTH 4 DAYS Performed at [facility]    Report Status PENDING  Incomplete  Urine culture     Status: Abnormal   Collection Time: 01/08/16  8:12 PM  Result Value Ref Range Status   Specimen Description URINE, RANDOM  Final   Special Requests NONE  Final   Culture MULTIPLE SPECIES PRESENT, SUGGEST RECOLLECTION (A)  Final   Report Status 01/10/2016 FINAL  Final  Culture, blood (x 2)     Status: None (Preliminary  result)   Collection Time: 01/08/16 11:04 PM  Result Value Ref Range Status   Specimen Description BLOOD BLOOD LEFT FOREARM  Final   Special Requests IN PEDIATRIC BOTTLE 2CC  Final   Culture   Final    NO GROWTH 4 DAYS Performed at [facility]    Report Status PENDING  Incomplete  Gastrointestinal Panel by PCR , Stool     Status: None   Collection Time: 01/09/16  3:02 PM  Result Value Ref Range Status   Campylobacter species NOT DETECTED NOT DETECTED Final   Plesimonas shigelloides NOT DETECTED NOT DETECTED Final   Salmonella species NOT DETECTED NOT DETECTED Final   Yersinia enterocolitica NOT DETECTED NOT DETECTED Final   Vibrio species NOT DETECTED NOT DETECTED Final   Vibrio cholerae NOT DETECTED NOT DETECTED Final   Enteroaggregative E coli (EAEC) NOT DETECTED NOT DETECTED Final   Enteropathogenic E coli (EPEC) NOT DETECTED NOT DETECTED Final   Enterotoxigenic E coli (ETEC) NOT DETECTED NOT DETECTED Final   Shiga like toxin producing E coli (STEC) NOT DETECTED NOT DETECTED Final   Shigella/Enteroinvasive E coli (EIEC) NOT DETECTED NOT DETECTED Final   Cryptosporidium NOT DETECTED NOT DETECTED Final   Cyclospora cayetanensis NOT DETECTED NOT DETECTED Final   Entamoeba histolytica NOT DETECTED NOT DETECTED Final   Giardia lamblia NOT DETECTED NOT DETECTED Final   Adenovirus F40/41 NOT DETECTED NOT DETECTED Final   Astrovirus NOT DETECTED NOT DETECTED Final     Norovirus GI/GII NOT DETECTED NOT DETECTED Final   Rotavirus A NOT DETECTED NOT DETECTED Final   Sapovirus (I, II, IV, and V) NOT DETECTED NOT DETECTED Final     Scheduled Meds: . acidophilus  1 capsule Oral Daily  . cholecalciferol  2,000 Units Oral Daily  . folic acid  2 mg Oral Daily  . levalbuterol  0.63 mg Nebulization BID  . metoprolol succinate  100 mg Oral BID  . metroNIDAZOLE  500 mg Oral TID  . multivitamin with minerals  1 tablet Oral Daily  . pravastatin  20 mg Oral QHS  . predniSONE  5 mg Oral Q breakfast  . verapamil  120 mg Oral Q12H  . Warfarin - Pharmacist Dosing Inpatient   Does not apply q1800   Continuous Infusions: . sodium chloride Stopped (01/14/16 0800)    Procedures/Studies: Dg Chest 1 View  Result Date: 01/10/2016 CLINICAL DATA:  Cough. EXAM: CHEST 1 VIEW COMPARISON:  01/09/2016 FINDINGS: Enlargement of the cardiac silhouette is unchanged. Lung volumes are diminished, more so than on the prior study. There is peribronchial cuffing and diffuse interstitial accentuation which have increased from the prior study. Patchy opacities in both lung bases have also increased. Small pleural effusions persist. No pneumothorax is seen. No acute osseous abnormality is identified. IMPRESSION: 1. Cardiomegaly with likely mild interstitial edema and small pleural effusions. 2. Low lung volumes with increased bibasilar opacities, likely atelectasis. Electronically Signed   By: Logan Bores M.D.   On: 01/10/2016 06:42   Dg Chest 2 View  Result Date: 12/27/2015 CLINICAL DATA:  Fever shin EXAM: CHEST  2 VIEW COMPARISON:  12/16/2015 FINDINGS: Cardiac shadow remains enlarged. The lungs are well aerated bilaterally. No focal infiltrate or sizable effusion is seen. Previously seen pericardial catheter has been removed. Degenerative change of the thoracic spine is noted. IMPRESSION: No active cardiopulmonary disease. Electronically Signed   By: Inez Catalina M.D.   On: 12/27/2015  21:23   Ct Head Wo Contrast  Result Date: 01/08/2016  CLINICAL DATA:  Fever with altered mental status. EXAM: CT HEAD WITHOUT CONTRAST TECHNIQUE: Contiguous axial images were obtained from the base of the skull through the vertex without intravenous contrast. COMPARISON:  12/28/2015 FINDINGS: Brain: No evidence of acute infarction, hemorrhage, hydrocephalus, extra-axial collection or mass lesion/mass effect. Stable mild brain parenchymal atrophy and microangiopathic changes in the deep white matter. Vascular: No hyperdense vessel or unexpected calcification. Skull: Normal. Negative for fracture or focal lesion. Sinuses/Orbits: No acute finding. Other: None. IMPRESSION: No acute intracranial abnormality. Stable mild brain parenchymal atrophy and chronic microvascular disease. Electronically Signed   By: Fidela Salisbury M.D.   On: 01/08/2016 17:33   Ct Head Wo Contrast  Result Date: 12/28/2015 CLINICAL DATA:  75 year old female with fall.  Generalized weakness. EXAM: CT HEAD WITHOUT CONTRAST TECHNIQUE: Contiguous axial images were obtained from the base of the skull through the vertex without intravenous contrast. COMPARISON:  Head CT dated 05/23/2015 FINDINGS: Brain: The ventricles and sulci are appropriate in size for patient's age. Mild periventricular and deep white matter chronic microvascular ischemic changes noted. There is no acute intracranial hemorrhage. No mass effect or midline shift noted. Vascular: No hyperdense vessel or unexpected calcification. Skull: Normal. Negative for fracture or focal lesion. Sinuses/Orbits: No acute finding. Other: None IMPRESSION: No acute intracranial pathology. Mild age-related atrophy and chronic microvascular ischemic changes. Electronically Signed   By: Anner Crete M.D.   On: 12/28/2015 02:04   Ct Chest W Contrast  Result Date: 01/08/2016 CLINICAL DATA:  Altered mental status. Fever. Cough. Abdominal pain. Diarrhea. Atrial fibrillation on Coumadin.  Hypertension. Possible pneumonia or colitis. EXAM: CT CHEST, ABDOMEN, AND PELVIS WITH CONTRAST TECHNIQUE: Multidetector CT imaging of the chest, abdomen and pelvis was performed following the standard protocol during bolus administration of intravenous contrast. CONTRAST:  177mL ISOVUE-300 IOPAMIDOL (ISOVUE-300) INJECTION 61% COMPARISON:  Plain films 01/08/2016. Chest CT 12/15/2015. Abdominopelvic CT 12/29/2015. FINDINGS: CT CHEST FINDINGS Cardiovascular: Aortic and branch vessel atherosclerosis. Tortuous thoracic aorta. Moderate cardiomegaly. Small pericardial effusion is similar to slightly increased since 12/29/2015. No central pulmonary embolism, on this non-dedicated study. Mediastinum/Nodes: Enlargement of a node within the azygoesophageal recess at 1.4 cm on image 26/series 2. Favored to be reactive, given development over the past 3 weeks. No hilar adenopathy. Tiny hiatal hernia. Lungs/Pleura: Small, right greater than left pleural effusions. Mild to moderate degradation secondary to motion and patient arm position, not raised above the head. Bibasilar atelectasis. Musculoskeletal: Lower thoracic hypoattenuating lesions are likely hemangiomas. CT ABDOMEN PELVIS FINDINGS Hepatobiliary: Hepatomegaly at 22.6 cm. Small gallstones without acute cholecystitis or biliary duct dilatation. Pancreas: Normal, without mass or ductal dilatation. Spleen: Splenic hypoattenuation is wedge-shaped, primarily superiorly on image 49/series 2. New since the prior. Adrenals/Urinary Tract: Normal adrenal glands. Normal left kidney. New hypoattenuation involving the lower pole right kidney is suspicious for infarct, including on image 75/ series 2. No hydronephrosis. Normal urinary bladder. Stomach/Bowel: Normal remainder of the stomach. Colonic stool burden suggests constipation. Normal small bowel. Vascular/Lymphatic: Aortic and branch vessel atherosclerosis. No abdominopelvic adenopathy. Reproductive: Hysterectomy.  No adnexal  mass. Other: No significant free fluid. Musculoskeletal: Lumbosacral spondylosis with degenerative disc disease at the lumbosacral junction. IMPRESSION: 1. multifactorial degradation, especially involving the chest. 2. 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 Suspect a central embolic source. Consider echocardiography. 3. Cholelithiasis. 4. Small bilateral pleural effusions with bibasilar atelectasis. 5. Cardiomegaly with slight increase in small pericardial effusion. 6.  Aortic atherosclerosis. These results will be called to the ordering clinician or representative  by the Radiologist Assistant, and communication documented in the PACS or zVision Dashboard. Electronically Signed   By: Abigail Miyamoto M.D.   On: 01/08/2016 17:54   Ct Chest W Contrast  Result Date: 12/15/2015 CLINICAL DATA:  Increased cough, shortness of breath, weakness and decreased appetite over the past few weeks. Loculated pleural effusion. EXAM: CT CHEST WITH CONTRAST TECHNIQUE: Multidetector CT imaging of the chest was performed during intravenous contrast administration. CONTRAST:  22mL ISOVUE-300 IOPAMIDOL (ISOVUE-300) INJECTION 61% COMPARISON:  05/24/2015. FINDINGS: Cardiovascular: Atherosclerotic calcification of the arterial vasculature. Heart is mildly enlarged. Large pericardial effusion is new from 05/24/2015. Mediastinum/Nodes: Mediastinal lymph nodes are not enlarged by CT size criteria. No hilar or axillary adenopathy. Esophagus is grossly unremarkable. Tiny hiatal hernia. Lungs/Pleura: Mild volume loss in the medial aspects of the right middle lobe and lingula. Minimal dependent atelectasis bilaterally. Lungs are otherwise clear. No pleural fluid. Airway is unremarkable. Upper Abdomen: Visualized portions of the liver, adrenal glands, kidneys, spleen, pancreas, stomach and bowel are grossly unremarkable with exception of a tiny hiatal hernia. Upper abdominal lymph nodes are not enlarged by CT size criteria.  Musculoskeletal: No worrisome lytic or sclerotic lesions. Degenerative changes are seen in the spine. IMPRESSION: 1. Large pericardial effusion, new from 05/24/2015. These results will be called to the ordering clinician or representative by the Radiologist Assistant, and communication documented in the PACS or zVision Dashboard. 2.  Aortic atherosclerosis (ICD10-170.0). Electronically Signed   By: Lorin Picket M.D.   On: 12/15/2015 16:08   Ct Abdomen Pelvis W Contrast  Result Date: 01/08/2016 CLINICAL DATA:  Altered mental status. Fever. Cough. Abdominal pain. Diarrhea. Atrial fibrillation on Coumadin. Hypertension. Possible pneumonia or colitis. EXAM: CT CHEST, ABDOMEN, AND PELVIS WITH CONTRAST TECHNIQUE: Multidetector CT imaging of the chest, abdomen and pelvis was performed following the standard protocol during bolus administration of intravenous contrast. CONTRAST:  163mL ISOVUE-300 IOPAMIDOL (ISOVUE-300) INJECTION 61% COMPARISON:  Plain films 01/08/2016. Chest CT 12/15/2015. Abdominopelvic CT 12/29/2015. FINDINGS: CT CHEST FINDINGS Cardiovascular: Aortic and branch vessel atherosclerosis. Tortuous thoracic aorta. Moderate cardiomegaly. Small pericardial effusion is similar to slightly increased since 12/29/2015. No central pulmonary embolism, on this non-dedicated study. Mediastinum/Nodes: Enlargement of a node within the azygoesophageal recess at 1.4 cm on image 26/series 2. Favored to be reactive, given development over the past 3 weeks. No hilar adenopathy. Tiny hiatal hernia. Lungs/Pleura: Small, right greater than left pleural effusions. Mild to moderate degradation secondary to motion and patient arm position, not raised above the head. Bibasilar atelectasis. Musculoskeletal: Lower thoracic hypoattenuating lesions are likely hemangiomas. CT ABDOMEN PELVIS FINDINGS Hepatobiliary: Hepatomegaly at 22.6 cm. Small gallstones without acute cholecystitis or biliary duct dilatation. Pancreas: Normal,  without mass or ductal dilatation. Spleen: Splenic hypoattenuation is wedge-shaped, primarily superiorly on image 49/series 2. New since the prior. Adrenals/Urinary Tract: Normal adrenal glands. Normal left kidney. New hypoattenuation involving the lower pole right kidney is suspicious for infarct, including on image 75/ series 2. No hydronephrosis. Normal urinary bladder. Stomach/Bowel: Normal remainder of the stomach. Colonic stool burden suggests constipation. Normal small bowel. Vascular/Lymphatic: Aortic and branch vessel atherosclerosis. No abdominopelvic adenopathy. Reproductive: Hysterectomy.  No adnexal mass. Other: No significant free fluid. Musculoskeletal: Lumbosacral spondylosis with degenerative disc disease at the lumbosacral junction. IMPRESSION: 1. multifactorial degradation, especially involving the chest. 2. 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 Suspect a central embolic source. Consider echocardiography. 3. Cholelithiasis. 4. Small bilateral pleural effusions with bibasilar atelectasis. 5. Cardiomegaly with slight increase in small pericardial effusion. 6.  Aortic atherosclerosis. These results will be called to the ordering clinician or representative by the Radiologist Assistant, and communication documented in the PACS or zVision Dashboard. Electronically Signed   By: Abigail Miyamoto M.D.   On: 01/08/2016 17:54   Ct Abdomen Pelvis W Contrast  Result Date: 12/29/2015 CLINICAL DATA:  Chronic AFib with worsening fatigue in diarrhea EXAM: CT ABDOMEN AND PELVIS WITH CONTRAST TECHNIQUE: Multidetector CT imaging of the abdomen and pelvis was performed using the standard protocol following bolus administration of intravenous contrast. CONTRAST:  122mL ISOVUE-300 IOPAMIDOL (ISOVUE-300) INJECTION 61% COMPARISON:  05/25/2015 ultrasound, CT 04/08/2010 FINDINGS: Lower chest: Small bilateral right greater than left pleural effusion. Passive atelectasis in the bilateral lower lobes. 2.5  cm cyst or bleb in the right lower lobe. Small bleb posterior left lower lobe. There is cardiomegaly with biatrial enlargement. Small pericardial effusion. Small nodular focus right middle lobe, possibly inflammatory or related to atelectasis. Hepatobiliary: Mild diffuse decreased density of the hepatic parenchyma consistent with fatty change. No focal hepatic abnormality. The gallbladder is contracted and filled with hyperdense sludge or calcified stones. No biliary dilatation. Pancreas: Unremarkable. No pancreatic ductal dilatation or surrounding inflammatory changes. Spleen: Normal in size without focal abnormality. Adrenals/Urinary Tract: Adrenal glands are within normal limits. Subcentimeter cortical hypodense lesions in the kidneys, too small to further characterize. No hydronephrosis. Bladder is unremarkable Stomach/Bowel: Stomach is nonenlarged. No dilated small bowel to suggest an obstruction. There is no colon wall thickening. The appendix is not well identified but no right lower quadrant inflammation is seen. Vascular/Lymphatic: Aortic atherosclerosis. No enlarged abdominal or pelvic lymph nodes. Reproductive: Status post hysterectomy. No adnexal masses. Other: Trace amount of fluid in the pelvis.  No free air. Musculoskeletal: Degenerative changes of the spine. Probable hemangioma in T11. IMPRESSION: 1. No evidence for bowel obstruction. No significant colon wall thickening or colon inflammation. 2. Small bilateral pleural effusions, right greater than left. 3. Cardiomegaly with biatrial enlargement. Small pericardial effusion. 4. Mild fatty infiltration of the liver 5. Contracted gallbladder filled with calcified stones Electronically Signed   By: Donavan Foil M.D.   On: 12/29/2015 21:02   Dg Chest Port 1 View  Result Date: 01/09/2016 CLINICAL DATA:  75 year old female with sepsis. EXAM: PORTABLE CHEST 1 VIEW COMPARISON:  Chest radiograph dated 01/08/2016 and CT dated 01/08/2016 FINDINGS: There  are small bilateral pleural effusions, left greater right and bibasilar hazy densities compatible with atelectasis versus infiltrate. There is moderate cardiomegaly. No pneumothorax. Osteopenia with degenerative changes of the spine. No acute osseous pathology. IMPRESSION: Small bilateral pleural effusions and bibasilar atelectasis versus infiltrate. Moderate cardiomegaly. Electronically Signed   By: Anner Crete M.D.   On: 01/09/2016 06:24   Dg Chest Port 1 View  Result Date: 12/16/2015 CLINICAL DATA:  Pericardiocentesis for pericardial effusion. EXAM: PORTABLE CHEST 1 VIEW COMPARISON:  CT from 12/15/2015 FINDINGS: There is a catheter noted along the base of the heart with the tip in head the right cardiophrenic angle and base of heart. Mild interstitial prominence is noted without pneumothorax. The cardiac silhouette is enlarged. There is mild aortic atherosclerosis. No pneumonic consolidation or suspicious osseous lesions. IMPRESSION: Catheter tip is seen along the base the heart to the right of midline. Enlarged cardiac silhouette is noted. There is aortic atherosclerosis. No pneumothorax is identified. Electronically Signed   By: [name] Royalty M.D.   On: 12/16/2015 19:24   Dg Abd Acute W/chest  Result Date: 01/08/2016 CLINICAL DATA:  Abdominal pain, weakness, nausea/vomiting EXAM: DG ABDOMEN ACUTE  W/ 1V CHEST COMPARISON:  CT abdomen/pelvis dated 12/29/2015 FINDINGS: Small bilateral pleural effusions. Possible mild perihilar edema. Mild left lower lobe opacity, likely atelectasis. Cardiomegaly. Nonobstructive bowel gas pattern. No evidence of free air on the lateral decubitus view. Mild degenerative changes the visualized thoracolumbar spine. IMPRESSION: Cardiomegaly with possible mild perihilar edema. Small bilateral pleural effusions. Mild left lower lobe opacity, likely atelectasis. No evidence of small bowel obstruction or free air. Electronically Signed   By: Julian Hy M.D.   On:  01/08/2016 13:48    Heman Que, DO  [facility] Pager [phone]  If 7PM-7AM, please contact night-coverage [email or web address] Password [email or web address] 01/14/2016, 9:29 AM   LOS: 6 days

## 2016-01-14 NOTE — Progress Notes (Signed)
Patient was transferred from ICU to 1445. Admission vital sign is stable and patient is AOX4

## 2016-01-14 NOTE — Progress Notes (Signed)
Patient Name: [name] Date of Encounter: 01/14/2016  Primary Cardiologist: Lendell Caprice, MD   Hospital Problem List     Principal Problem:   Sepsis [facility]) Active Problems:   Long term current use of anticoagulant therapy   Hypertension   Hyperlipidemia   QT prolongation   Rheumatoid arthritis (HCC)   Pleuritic chest pain   Chronic atrial fibrillation (HCC)   Pulmonary hypertension   Pericardial effusion   Diarrhea   Acute on chronic diastolic CHF (congestive heart failure) ([place])   Immunosuppressed status (HCC)   Splenic infarct   Renal infarct (HCC)     Subjective   Breathing improved - feels like inhalers have helped to mobilize secretions and open airways.  Notes elevated HR's @ times - like drum beating.  No chest pain or dyspnea at rest.  Inpatient Medications    . acidophilus  1 capsule Oral Daily  . chlorhexidine  15 mL Mouth Rinse BID  . cholecalciferol  2,000 Units Oral Daily  . folic acid  2 mg Oral Daily  . levalbuterol  0.63 mg Nebulization BID  . mouth rinse  15 mL Mouth Rinse q12n4p  . metoprolol succinate  100 mg Oral BID  . metroNIDAZOLE  500 mg Oral TID  . multivitamin with minerals  1 tablet Oral Daily  . pravastatin  20 mg Oral QHS  . predniSONE  5 mg Oral Q breakfast  . verapamil  120 mg Oral Q12H  . Warfarin - Pharmacist Dosing Inpatient   Does not apply q1800    Vital Signs    Vitals:   01/14/16 0400 01/14/16 0500 01/14/16 0600 01/14/16 0700  BP: 117/67  107/76   Pulse: 62 92 (!) 112 86  Resp: 18 (!) 21 (!) 23 19  Temp: 98.1 F (36.7 C)     TempSrc: Oral     SpO2: 96% 96% 97% 97%  Weight:      Height:        Intake/Output Summary (Last 24 hours) at 01/14/16 0802 Last data filed at 01/14/16 0200  Gross per 24 hour  Intake               40 ml  Output              500 ml  Net             -460 ml   Filed Weights   01/12/16 0408 01/13/16 0100 01/14/16 0100  Weight: 152 lb 5.4 oz (69.1 kg) 143 lb 11.8 oz (65.2 kg) 142  lb 6.7 oz (64.6 kg)    Physical Exam   GEN: Well nourished, well developed, in no acute distress.  HEENT: Grossly normal.  Neck: Supple, no JVD, carotid bruits, or masses. Cardiac: IR, IR, no murmurs, rubs, or gallops. No clubbing, cyanosis, edema.  Radials/DP/PT 2+ and equal bilaterally.  Respiratory:  Respirations regular and unlabored, diminished breath sounds bilat bases, otw clear to auscultation bilaterally. GI: Soft, nontender, nondistended, BS + x 4. MS: no deformity or atrophy. Skin: warm and dry, no rash. Neuro:  Strength and sensation are intact. Psych: AAOx3.  Normal affect.  Labs    CBC  Recent Labs  01/13/16 0332 01/14/16 0330  WBC 8.0 9.2  HGB 10.4* 11.4*  HCT 31.8* 34.5*  MCV 90.6 89.8  PLT 297 [id number]   Basic Metabolic Panel  Recent Labs  01/12/16 0401 01/13/16 0332 01/14/16 0330  NA 137 141 138  K 3.3* 3.9 3.5  CL  109 108 108  CO2 24 26 24   GLUCOSE 93 125* 97  BUN 11 8 11   CREATININE 0.56 0.60 0.63  CALCIUM 8.1* 8.7* 8.3*  MG 1.9 2.0  --   PHOS 3.0  --   --    Lab Results  Component Value Date   INR 2.18 01/14/2016   INR 2.65 01/13/2016   INR 2.72 01/12/2016     Telemetry    Afib, 70's to 1-teens  Radiology    No results found.  Patient Profile     75 yo female w/ hx HTN, HLD, Ra, mild pHTN, nl cors and EF by cath 2012, chronicatrial fibrillation, prolonged QT, pericardial effusion s/p pericardiocentesis/pericardial drain 11/2015 (dark sanguinous/bloody fluid, path neg for malignancy) from recurrent pneumonias so off Coumadin (with initial plan to resume 2-4 weeks around 01/17/16, intolerant of colchicine due to diarrhea). Admitted 12/11-12/17 SIRS &C diff, cardiology followed for rate control. Readmitted 01/08/16 w/ AMS, fevers, diarrhea, cough-non productive, sepsis (possibly from HCAP vs GI illness). Cards consulted for renal and splenic infarcts seen on CT. Now on heparin>>coumadin. Hospitalization also notable for elevated HR, low  BP, acute on chronic diastolic CHF, and QT prolongation (known). 2D echo 01/09/16: EF 50-55%, mild LAE, mild MR/TR, PASP 40, no pericardial effusion.  Assessment & Plan    1.  Sepsis:  Steadily improving.  Resp status better.  Abx, steroids, inhalers per IM.  2.  Chronic Atrial Fibrillation:  Rate 70's to 1-teens at rest.  Overall stable.  Rates driven by #1 at this point.  Cont  blocker and verapamil.  Anticoagulated w/ coumadin and INR Rx.  3.  Acute on chronic diastolic CHF: + [id number] since admission.  Wt stable  142 lbs.  BP stable.  HR variable.  Euvolemic on exam.    4.  Pericardial Effusion: No effusion noted on 12/24 echo.  F/u limited echo pending now that she is back on coumadin and INR Rx.  5.  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  Coumadin now resumed and INR Rx.  Signed, Murray Hodgkins NP 01/14/2016, 8:02 AM

## 2016-01-14 NOTE — Progress Notes (Signed)
  Echocardiogram 2D Echocardiogram Limited has been performed.  [name] M 01/14/2016, 1:22 PM

## 2016-01-15 DIAGNOSIS — N280 Ischemia and infarction of kidney: Secondary | ICD-10-CM

## 2016-01-15 DIAGNOSIS — J189 Pneumonia, unspecified organism: Secondary | ICD-10-CM

## 2016-01-15 DIAGNOSIS — A419 Sepsis, unspecified organism: Principal | ICD-10-CM

## 2016-01-15 DIAGNOSIS — E785 Hyperlipidemia, unspecified: Secondary | ICD-10-CM

## 2016-01-15 DIAGNOSIS — D735 Infarction of spleen: Secondary | ICD-10-CM

## 2016-01-15 DIAGNOSIS — M069 Rheumatoid arthritis, unspecified: Secondary | ICD-10-CM

## 2016-01-15 DIAGNOSIS — R197 Diarrhea, unspecified: Secondary | ICD-10-CM

## 2016-01-15 LAB — BASIC METABOLIC PANEL
Anion gap: 8 (ref 5–15)
BUN: 10 mg/dL (ref 6–20)
CHLORIDE: 105 mmol/L (ref 101–111)
CO2: 27 mmol/L (ref 22–32)
CREATININE: 0.62 mg/dL (ref 0.44–1.00)
Calcium: 8.7 mg/dL — ABNORMAL LOW (ref 8.9–10.3)
GFR calc Af Amer: >60 mL/min (ref >60)
GFR calc non Af Amer: >60 mL/min (ref >60)
Glucose, Bld: 90 mg/dL (ref 65–99)
POTASSIUM: 3.9 mmol/L (ref 3.5–5.1)
SODIUM: 140 mmol/L (ref 135–145)

## 2016-01-15 LAB — CBC
HEMATOCRIT: 35.8 % — AB (ref 36.0–46.0)
Hemoglobin: 11.6 g/dL — ABNORMAL LOW (ref 12.0–15.0)
MCH: 29.3 pg (ref 26.0–34.0)
MCHC: 32.4 g/dL (ref 30.0–36.0)
MCV: 90.4 fL (ref 78.0–100.0)
Platelets: 424 10*3/uL — ABNORMAL HIGH (ref 150–400)
RBC: 3.96 MIL/uL (ref 3.87–5.11)
RDW: 16 % — ABNORMAL HIGH (ref 11.5–15.5)
WBC: 8.5 10*3/uL (ref 4.0–10.5)

## 2016-01-15 LAB — PROTIME-INR
INR: 1.9
PROTHROMBIN TIME: 22.1 s — AB (ref 11.4–15.2)

## 2016-01-15 MED ORDER — WARFARIN SODIUM 5 MG PO TABS
5.0000 mg | ORAL_TABLET | Freq: Once | ORAL | Status: AC
Start: 2016-01-15 — End: 2016-01-15
  Administered 2016-01-15: 5 mg via ORAL
  Filled 2016-01-15: qty 1

## 2016-01-15 MED ORDER — METOPROLOL SUCCINATE ER 25 MG PO TB24
125.0000 mg | ORAL_TABLET | Freq: Two times a day (BID) | ORAL | Status: DC
  Administered 2016-01-15 – 2016-01-16 (×4): 125 mg via ORAL
  Filled 2016-01-15 (×4): qty 1

## 2016-01-15 NOTE — Evaluation (Signed)
Physical Therapy Evaluation Patient Details Name: [name] MRN: [id number] DOB: [birth year] Today's Date: 01/15/2016   History of Present Illness  Pt admittedd to [facility] on 01/08/2016 with weakness and diarrhea with diagnosis of sepsis.  She has had recent hospitalizations and procedures.  She states she want to get the atrial fibrillation under control . She says she is on 5 heart pills. History includes RA, but she hasn't been able to take her medicine becasue she has been sick   Clinical Impression  [name] is able to walk in the hallway with supervision.  She is using a RW just for safety, but I do not think she will need it for long.  Encouraged nursing to walk with patient in hallway as tolerated several times a day to increase her activity tolerance prior to discharge to home.  Do not feel she needs HHPT     Follow Up Recommendations No PT follow up    Equipment Recommendations  None recommended by PT    Recommendations for Other Services       Precautions / Restrictions Precautions Precautions: Other (comment) Precaution Comments: watch heart rate  Restrictions Weight Bearing Restrictions: No      Mobility  Bed Mobility Overal bed mobility: Needs Assistance (extra time ) Bed Mobility: Supine to Sit     Supine to sit: Supervision     General bed mobility comments: needs extra time   Transfers Overall transfer level: Needs assistance Equipment used: Rolling walker (2 wheeled)             General transfer comment: nees supervision for safety  Ambulation/Gait Ambulation/Gait assistance: Min guard Ambulation Distance (Feet): 200 Feet Assistive device: Rolling walker (2 wheeled) (for balance only, pt feels a little "funny" in upright ) Gait Pattern/deviations: WFL(Within Functional Limits) Gait velocity: slow   General Gait Details: slow pace, pt feels "funny":  HR 81 with arrythmia  Stairs            Wheelchair Mobility    Modified  Rankin (Stroke Patients Only)       Balance                                             Pertinent Vitals/Pain Pain Assessment: No/denies pain    Home Living Family/patient expects to be discharged to:: Private residence Living Arrangements: Spouse/significant other Available Help at Discharge: Family Type of Home: House Home Access: Stairs to enter Entrance Stairs-Rails: None Technical brewer of Steps: 1 Home Layout: One level   Additional Comments: 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 pt had recent pericardial effusion and was told not to lift/push/pull, spouse has w/c, drop arm BSC, transfer board, hospital bed    Prior Function Level of Independence: Independent               Hand Dominance        Extremity/Trunk Assessment   Upper Extremity Assessment Upper Extremity Assessment: Overall WFL for tasks assessed    Lower Extremity Assessment Lower Extremity Assessment: Overall WFL for tasks assessed       Communication   Communication: No difficulties  Cognition Arousal/Alertness: Awake/alert Behavior During Therapy: WFL for tasks assessed/performed Overall Cognitive Status: Within Functional Limits for tasks assessed                      General Comments General  comments (skin integrity, edema, etc.): pt appears to have generalized weakness and deconditioning    Exercises     Assessment/Plan    PT Assessment Patient needs continued PT services  PT Problem List Decreased strength;Decreased activity tolerance          PT Treatment Interventions Gait training;Stair training;Functional mobility training;Therapeutic exercise;Patient/family education    PT Goals (Current goals can be found in the Care Plan section)  Acute Rehab PT Goals Patient Stated Goal: to go home next week  PT Goal Formulation: With patient Time For Goal Achievement: 01/21/16 Potential to Achieve Goals: Good    Frequency Min 2X/week    Barriers to discharge        Co-evaluation               End of Session Equipment Utilized During Treatment: Gait belt Activity Tolerance: Patient limited by fatigue Patient left: in chair;with call bell/phone within reach Nurse Communication: Mobility status         Time: 1235-1300 PT Time Calculation (min) (ACUTE ONLY): 25 min   Charges:   PT Evaluation $PT Eval Low Complexity: 1 Procedure     PT G Codes:       Teresa K. Owens Shark, PT  Norwood Levo 01/15/2016, 1:08 PM

## 2016-01-15 NOTE — Progress Notes (Signed)
Verbal order given by Dr. Dyann Kief for PT and OT. Order placed. VWilliams,rn.

## 2016-01-15 NOTE — Progress Notes (Signed)
ANTICOAGULATION CONSULT NOTE - Follow Up Consult  Pharmacy Consult for warfarin Indication: hx atrial fibrillation; new renal and splenic infarcts  Patient Measurements: Height: 5\' 3"  (160 cm) Weight: 138 lb 14.2 oz (63 kg) IBW/kg (Calculated) : 52.4 Heparin Dosing Weight: 66 kg  Vital Signs: Temp: 98 F (36.7 C) (12/30 0517) Temp Source: Oral (12/30 0517) BP: 125/50 (12/30 0517) Pulse Rate: 100 (12/30 0950)  Labs:  Recent Labs  01/13/16 0332 01/14/16 0330 01/15/16 0337  HGB 10.4* 11.4* 11.6*  HCT 31.8* 34.5* 35.8*  PLT 297 380 424*  LABPROT 28.8* 24.7* 22.1*  INR 2.65 2.18 1.90  CREATININE 0.60 0.63 0.62    Estimated Creatinine Clearance: 55.1 mL/min (by C-G formula based on SCr of 0.62 mg/dL).   Medications:  Last warfarin regimen (per [facility] clinic note on 11/10): 5 mg daily except 2.5 mg on Wednesday  Infusions: . sodium chloride Stopped (01/14/16 0800)     Assessment: Patient is a 75 y.o F with hx pericardial effusion in in November 2017 (s/p Pericardiocentesis and pericardial drain) and afib on warfarin PTA.  Outpatient cardiology note on 12/19 indicated that warfarin was placed on hold with plan to resume "during the first week of January" d/t bloody pericardial effusion.  She presented to the ED in 12/23 with c/o fatigue, hallucination and nausea.  Abd CT on 12/23 showed new renal splenic and right renal infarcts.  Heparin started and warfarin resumed on 12/23 for afib and infarcts.    Today, 01/15/2016: INR falling, now subtherapeutic.   Now off Flagyl (LD 12/28), off cefepime/vanco (LD 12/28) On Heart Healthy diet, charted as eating all of dinner last night H/H, pltc stable No bleeding reported With antibiotics (especially Flagyl) now discontinued, patient may tolerate warfarin doses closer to her previous usual regimen.  Anticoagulation clinic records show INR was previously stable on warfarin 5 mg daily except 2.5 mg Thursdays. - Goal of Therapy:  INR  2-3    Plan:  1. Warfarin 5 mg PO today at 6pm. 2.  PT/INR daily while inpatient 3.  Monitor CBC and for reports of bleeding.  Clayburn Pert, PharmD, BCPS Pager: [phone] 01/15/2016  11:28 AM

## 2016-01-15 NOTE — Progress Notes (Signed)
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 Patient was discharged home and did well the first few days but then states lost her appetite and started coughing and had worsening diarrhea over the course of the week. Patient states she became more fatigued and developed fevers.  The patient continued to have loose stools to the point of having bowel incontinence. No blood in stool noted. Has had fevers and chills at home and has worsened since discharge on 01/02/2016. Per daughter has been so weak and has only lying in bed and not been eating. Hospitalist was called to Admit because of Sepsis. Since admission, the patient's diarrhea had resolved.  The patient finished 5 days of vancomycin and cefepime for possible HCAP.  CT of the abdomen and pelvis at the time of admission revealed new splenic and renal infarcts. The patient was restarted on warfarin which was initially stopped at the end of November secondary to serosanguineous drainage from pericardiocentesis. The patient was also treated with intravenous furosemide for acute on chronic diastolic CHF.  Assessment/Plan: Sepsis from Health Care Acquired Pneumonia vs. Gastrointestinal Illness -Patient wass Septic on Admission with Fever of 102, Tachycardia, Tachypnea, Leukocytosis of 18.8 and Lactic Acid Level of 4.83. Likely HCAP vs viral infection -PCT--0.12;  -lactate 4.83--->1.3 -Blood Cx's x2 showed No growth up to date   -Urinalysis Negative  -Chest and Abdominal Imaging X-Ray showed Cardiomegaly with possible mild perihilar  edema. Small bilateral pleural effusions. Mild left lower lobe opacity, likely atelectasis. No evidence of small bowel obstruction or free air. -WBC went from 18.8 ->12.2 ->8.5>>9.2>>8.5 -patient completed 5 days of vancomycin and cefepime -finished also 11 days of metronidazole -Follow blood culture, urine culture-->> has remained neg -CT of Chest/Abd/Pelvis Not revealing infection -fever partly attributable to new splenic and renal infarct (and her fever is now resolved) -sepsis physiology/features resolved -will continue observing off abx's  Atrial Fibrillation with RVR:  -CHA2DS2-VASc Scoreis 3- Anticoagulation with Coumadin -previously held due to Bloody Pericardial Effusion -Per notes was to be started During the First Week of January; However given CT ABD/PELVIS FINDINGS (with splenic and right renal infarcts) RESTARTED HEPARIN gtt and Warfarain -INR Now Therapeutic  -continue Metoprolol and Verapamil  -HR remains improved with adjustment done by cardiology to her regimen.  -Cardiology followup appreciated  Acute on Chronic systolic congestive heart failure: -ECHOon 12/29/15 showed EF of 50-55 %. However previous [facility] showed EF of 40-45%.  -BNP was 302.0 -Strict I's and O's and Daily Weight--was +5.7L -Given IV Lasix 12/25, 12/26, 12/27 and 12/29; currently appears euvolemic -baseline weight (144-145) -will follow cardiology rec's on discharge regimen   Diarrhea -Enteric Percautions--discontinued as the patient has not had any further diarrhea -CT of Abd/Pelvis showed Splenic and Right Renal Infarcts which are new.  -Stomach/Bowel: Normal remainder of the stomach. Colonic stool burden suggests constipation. Normal small bowel. -GI Stool Panel Negative -advise to follow adequate hydration and PRN miralax for constipation   Acute respiratory failure with hypoxia -secondary to PNA and CHF -resolved -now stable O2 sat on RA  Pericardial Effusion:  -S/p of  pericardiocentesis and Pericardial drain placement with  removal of 500 mL of Dark Sanguinous Fluid during the Admission of 11/29-12/3. -repeatedechocardiograms without significant fluid accumulation before discharge. -Pt did not want [place] for Pericarditis -CT of the Chest showed Cardiomegaly with slight increase in small pericardial effusion. -01/09/16--ECHOCardiogram did not show Pericardial Effusion. -12-29--Echo with trivial effusion; EF 45-50%   Hypotension -BP soft, but stable -will monitor and minimize IVF's, she required lasix due to mild fluid overload with IVF's resuscitation   Visual Hallucinations -Head CT Negative and showed No acute intracranial abnormality. Stable mild brain parenchymal atrophy and chronic microvascular disease. -Avoid medications that can altered sensorium  -resolved  Hypokalemia -Repleted -Repeat BMP in AM -last Mg 2.0  Mild Hyponatremia -Improved/resolved.  Atypical CP  -Troponin I x 3 Negative -no EKG or telemetry changes for ischemia   Generalized Weakness and Deconditioning -PT/OT Evaluation completed and patient found to be safe to return home w/o PT follow up.  Rheumatoid Arthritis:  -On prednisone 2.5 mg daily at home--on stress dose presently -Hold Home Methotrexate and Etanercept. Clinically stable. -double home dose steroid for 1 week at discharge  HLD: -Last LDL was79 on 05/21/13 -Continue Pravastatin     Disposition Plan: Home when heart rate better control. Most likely 1-2 days. Will follow cardiology rec's  Family Communication: No Family at bedside  Consultants: Cardiolpgy  Code Status: FULL   DVT Prophylaxis: warfarin   Procedures: As Listed in Progress Note Above  Antibiotics: Cefepime 01/08/2016>>> 01/12/2016 Vancomycin 01/08/2016>>> 01/12/2016 Metronidazole 01/02/2016>>> 01/13/2016    Subjective: Patient feeling better overall. denies CP and SOB. Endorses feeling  weak.   Objective: Vitals:   01/15/16 0500 01/15/16 0517 01/15/16 0950 01/15/16 1314  BP:  (!) 125/50  (!) 90/52  Pulse:  (!) 121 100 61  Resp:  20 20 18   Temp:  98 F (36.7 C)  97.7 F (36.5 C)  TempSrc:  Oral  Oral  SpO2:  97% 97% 99%  Weight: 63 kg (138 lb 14.2 oz)     Height:        Intake/Output Summary (Last 24 hours) at 01/15/16 1838 Last data filed at 01/15/16 1833  Gross per 24 hour  Intake             1080 ml  Output                0 ml  Net             1080 ml   Weight change: -1.6 kg (-3 lb 8.4 oz) Exam:   General:  Pt is alert, follows commands appropriately, not in acute distress; afebrile and deneis CP and SOB. Reports feeling weak.  HEENT: No icterus, No thrush, No neck mass, [place]/AT  Cardiovascular: RRR, S1/S2, no rubs, no gallops  Respiratory: Good air movement, no wheezing, no crackles and currently not using accessory muscles   Abdomen: Soft/+BS, non tender, non distended, no guarding  Extremities: No edema, No lymphangitis, No petechiae, No rashes, no synovitis   Data Reviewed: I have personally reviewed following labs and imaging studies  Basic Metabolic Panel:  Recent Labs Lab 01/10/16 0212 01/11/16 0335 01/12/16 0401 01/13/16 0332 01/14/16 0330 01/15/16 0337  NA 133* 137 137 141 138 140  K 4.0 3.3* 3.3* 3.9 3.5 3.9  CL 104 108 109 108 108 105  CO2 23 25 24 26 24 27   GLUCOSE 136* 132* 93 125* 97 90  BUN 10 11 11 8 11 10   CREATININE 0.79 0.63 0.56 0.60 0.63 0.62  CALCIUM  8.3* 8.4* 8.1* 8.7* 8.3* 8.7*  MG 1.8 2.0 1.9 2.0  --   --   PHOS 2.7 2.9 3.0  --   --   --    Liver Function Tests:  Recent Labs Lab 01/08/16 1848 01/09/16 0609 01/10/16 0212 01/11/16 0335  AST 19 42* 28 26  ALT 17 28 24 22   ALKPHOS 50 55 53 53  BILITOT 0.9 1.0 0.5 0.5  PROT 5.8* 6.0* 5.7* 5.7*  ALBUMIN 2.9* 3.0* 2.8* 2.7*   Coagulation Profile:  Recent Labs Lab 01/11/16 0335 01/12/16 0401 01/13/16 0332 01/14/16 0330 01/15/16 0337  INR 2.24  2.72 2.65 2.18 1.90   CBC:  Recent Labs Lab 01/08/16 1848  01/10/16 0212 01/11/16 0335 01/12/16 0401 01/13/16 0332 01/14/16 0330 01/15/16 0337  WBC 16.2*  < > 12.2* 10.2 8.5 8.0 9.2 8.5  NEUTROABS 14.6*  --  10.7*  --   --   --   --   --   HGB 11.9*  < > 10.3* 10.3* 10.0* 10.4* 11.4* 11.6*  HCT 36.6  < > 31.6* 31.1* 30.8* 31.8* 34.5* 35.8*  MCV 91.3  < > 91.3 91.5 91.4 90.6 89.8 90.4  PLT 263  < > 234 213 234 297 380 424*  < > = values in this interval not displayed. Cardiac Enzymes:  Recent Labs Lab 01/08/16 1848 01/08/16 2304 01/09/16 0609  TROPONINI <0.03 <0.03 <0.03   Urine analysis:    Component Value Date/Time   COLORURINE AMBER (A) 01/08/2016 1507   APPEARANCEUR TURBID (A) 01/08/2016 1507   LABSPEC 1.014 01/08/2016 1507   PHURINE 8.0 01/08/2016 1507   GLUCOSEU NEGATIVE 01/08/2016 1507   HGBUR NEGATIVE 01/08/2016 1507   BILIRUBINUR NEGATIVE 01/08/2016 1507   KETONESUR NEGATIVE 01/08/2016 1507   PROTEINUR 100 (A) 01/08/2016 1507   UROBILINOGEN 0.2 10/29/2013 1932   NITRITE NEGATIVE 01/08/2016 1507   LEUKOCYTESUR NEGATIVE 01/08/2016 1507    Recent Results (from the past 240 hour(s))  Blood culture (routine x 2)     Status: None   Collection Time: 01/08/16  2:14 PM  Result Value Ref Range Status   Specimen Description BLOOD LEFT FOREARM  Final   Special Requests IN PEDIATRIC BOTTLE 5CC  Final   Culture   Final    NO GROWTH 5 DAYS Performed at [facility]    Report Status 01/13/2016 FINAL  Final  Urine culture     Status: None   Collection Time: 01/08/16  3:07 PM  Result Value Ref Range Status   Specimen Description URINE, CATHETERIZED  Final   Special Requests NONE  Final   Culture NO GROWTH Performed at [facility]   Final   Report Status 01/10/2016 FINAL  Final  Blood culture (routine x 2)     Status: None   Collection Time: 01/08/16  3:15 PM  Result Value Ref Range Status   Specimen Description BLOOD RIGHT ANTECUBITAL  Final    Special Requests BOTTLES DRAWN AEROBIC AND ANAEROBIC 5CC  Final   Culture   Final    NO GROWTH 5 DAYS Performed at [facility]    Report Status 01/13/2016 FINAL  Final  Culture, blood (x 2)     Status: None   Collection Time: 01/08/16  6:48 PM  Result Value Ref Range Status   Specimen Description BLOOD LEFT ANTECUBITAL  Final   Special Requests IN PEDIATRIC BOTTLE [facility]  Final   Culture   Final    NO GROWTH 5 DAYS  Performed at [facility]    Report Status 01/14/2016 FINAL  Final  Urine culture     Status: Abnormal   Collection Time: 01/08/16  8:12 PM  Result Value Ref Range Status   Specimen Description URINE, RANDOM  Final   Special Requests NONE  Final   Culture MULTIPLE SPECIES PRESENT, SUGGEST RECOLLECTION (A)  Final   Report Status 01/10/2016 FINAL  Final  Culture, blood (x 2)     Status: None   Collection Time: 01/08/16 11:04 PM  Result Value Ref Range Status   Specimen Description BLOOD BLOOD LEFT FOREARM  Final   Special Requests IN PEDIATRIC BOTTLE [place]  Final   Culture   Final    NO GROWTH 5 DAYS Performed at [facility]    Report Status 01/14/2016 FINAL  Final  Gastrointestinal Panel by PCR , Stool     Status: None   Collection Time: 01/09/16  3:02 PM  Result Value Ref Range Status   Campylobacter species NOT DETECTED NOT DETECTED Final   Plesimonas shigelloides NOT DETECTED NOT DETECTED Final   Salmonella species NOT DETECTED NOT DETECTED Final   Yersinia enterocolitica NOT DETECTED NOT DETECTED Final   Vibrio species NOT DETECTED NOT DETECTED Final   Vibrio cholerae NOT DETECTED NOT DETECTED Final   Enteroaggregative E coli (EAEC) NOT DETECTED NOT DETECTED Final   Enteropathogenic E coli (EPEC) NOT DETECTED NOT DETECTED Final   Enterotoxigenic E coli (ETEC) NOT DETECTED NOT DETECTED Final   Shiga like toxin producing E coli (STEC) NOT DETECTED NOT DETECTED Final   Shigella/Enteroinvasive E coli (EIEC) NOT DETECTED NOT DETECTED Final    Cryptosporidium NOT DETECTED NOT DETECTED Final   Cyclospora cayetanensis NOT DETECTED NOT DETECTED Final   Entamoeba histolytica NOT DETECTED NOT DETECTED Final   Giardia lamblia NOT DETECTED NOT DETECTED Final   Adenovirus F40/41 NOT DETECTED NOT DETECTED Final   Astrovirus NOT DETECTED NOT DETECTED Final   Norovirus GI/GII NOT DETECTED NOT DETECTED Final   Rotavirus A NOT DETECTED NOT DETECTED Final   Sapovirus (I, II, IV, and V) NOT DETECTED NOT DETECTED Final     Scheduled Meds: . acidophilus  1 capsule Oral Daily  . cholecalciferol  2,000 Units Oral Daily  . folic acid  2 mg Oral Daily  . levalbuterol  0.63 mg Nebulization BID  . metoprolol succinate  125 mg Oral BID  . multivitamin with minerals  1 tablet Oral Daily  . pravastatin  20 mg Oral QHS  . predniSONE  5 mg Oral Q breakfast  . verapamil  120 mg Oral Q12H  . Warfarin - Pharmacist Dosing Inpatient   Does not apply q1800   Continuous Infusions: . sodium chloride Stopped (01/14/16 0800)    Procedures/Studies: Dg Chest 1 View  Result Date: 01/10/2016 CLINICAL DATA:  Cough. EXAM: CHEST 1 VIEW COMPARISON:  01/09/2016 FINDINGS: Enlargement of the cardiac silhouette is unchanged. Lung volumes are diminished, more so than on the prior study. There is peribronchial cuffing and diffuse interstitial accentuation which have increased from the prior study. Patchy opacities in both lung bases have also increased. Small pleural effusions persist. No pneumothorax is seen. No acute osseous abnormality is identified. IMPRESSION: 1. Cardiomegaly with likely mild interstitial edema and small pleural effusions. 2. Low lung volumes with increased bibasilar opacities, likely atelectasis. Electronically Signed   By: Logan Bores M.D.   On: 01/10/2016 06:42   Dg Chest 2 View  Result Date: 12/27/2015 CLINICAL DATA:  Fever  shin EXAM: CHEST  2 VIEW COMPARISON:  12/16/2015 FINDINGS: Cardiac shadow remains enlarged. The lungs are well aerated  bilaterally. No focal infiltrate or sizable effusion is seen. Previously seen pericardial catheter has been removed. Degenerative change of the thoracic spine is noted. IMPRESSION: No active cardiopulmonary disease. Electronically Signed   By: Inez Catalina M.D.   On: 12/27/2015 21:23   Ct Head Wo Contrast  Result Date: 01/08/2016 CLINICAL DATA:  Fever with altered mental status. EXAM: CT HEAD WITHOUT CONTRAST TECHNIQUE: Contiguous axial images were obtained from the base of the skull through the vertex without intravenous contrast. COMPARISON:  12/28/2015 FINDINGS: Brain: No evidence of acute infarction, hemorrhage, hydrocephalus, extra-axial collection or mass lesion/mass effect. Stable mild brain parenchymal atrophy and microangiopathic changes in the deep white matter. Vascular: No hyperdense vessel or unexpected calcification. Skull: Normal. Negative for fracture or focal lesion. Sinuses/Orbits: No acute finding. Other: None. IMPRESSION: No acute intracranial abnormality. Stable mild brain parenchymal atrophy and chronic microvascular disease. Electronically Signed   By: Fidela Salisbury M.D.   On: 01/08/2016 17:33   Ct Head Wo Contrast  Result Date: 12/28/2015 CLINICAL DATA:  75 year old female with fall.  Generalized weakness. EXAM: CT HEAD WITHOUT CONTRAST TECHNIQUE: Contiguous axial images were obtained from the base of the skull through the vertex without intravenous contrast. COMPARISON:  Head CT dated 05/23/2015 FINDINGS: Brain: The ventricles and sulci are appropriate in size for patient's age. Mild periventricular and deep white matter chronic microvascular ischemic changes noted. There is no acute intracranial hemorrhage. No mass effect or midline shift noted. Vascular: No hyperdense vessel or unexpected calcification. Skull: Normal. Negative for fracture or focal lesion. Sinuses/Orbits: No acute finding. Other: None IMPRESSION: No acute intracranial pathology. Mild age-related atrophy and  chronic microvascular ischemic changes. Electronically Signed   By: Anner Crete M.D.   On: 12/28/2015 02:04   Ct Chest W Contrast  Result Date: 01/08/2016 CLINICAL DATA:  Altered mental status. Fever. Cough. Abdominal pain. Diarrhea. Atrial fibrillation on Coumadin. Hypertension. Possible pneumonia or colitis. EXAM: CT CHEST, ABDOMEN, AND PELVIS WITH CONTRAST TECHNIQUE: Multidetector CT imaging of the chest, abdomen and pelvis was performed following the standard protocol during bolus administration of intravenous contrast. CONTRAST:  120mL ISOVUE-300 IOPAMIDOL (ISOVUE-300) INJECTION 61% COMPARISON:  Plain films 01/08/2016. Chest CT 12/15/2015. Abdominopelvic CT 12/29/2015. FINDINGS: CT CHEST FINDINGS Cardiovascular: Aortic and branch vessel atherosclerosis. Tortuous thoracic aorta. Moderate cardiomegaly. Small pericardial effusion is similar to slightly increased since 12/29/2015. No central pulmonary embolism, on this non-dedicated study. Mediastinum/Nodes: Enlargement of a node within the azygoesophageal recess at 1.4 cm on image 26/series 2. Favored to be reactive, given development over the past 3 weeks. No hilar adenopathy. Tiny hiatal hernia. Lungs/Pleura: Small, right greater than left pleural effusions. Mild to moderate degradation secondary to motion and patient arm position, not raised above the head. Bibasilar atelectasis. Musculoskeletal: Lower thoracic hypoattenuating lesions are likely hemangiomas. CT ABDOMEN PELVIS FINDINGS Hepatobiliary: Hepatomegaly at 22.6 cm. Small gallstones without acute cholecystitis or biliary duct dilatation. Pancreas: Normal, without mass or ductal dilatation. Spleen: Splenic hypoattenuation is wedge-shaped, primarily superiorly on image 49/series 2. New since the prior. Adrenals/Urinary Tract: Normal adrenal glands. Normal left kidney. New hypoattenuation involving the lower pole right kidney is suspicious for infarct, including on image 75/ series 2. No  hydronephrosis. Normal urinary bladder. Stomach/Bowel: Normal remainder of the stomach. Colonic stool burden suggests constipation. Normal small bowel. Vascular/Lymphatic: Aortic and branch vessel atherosclerosis. No abdominopelvic adenopathy. Reproductive: Hysterectomy.  No adnexal mass. Other:  No significant free fluid. Musculoskeletal: Lumbosacral spondylosis with degenerative disc disease at the lumbosacral junction. IMPRESSION: 1. multifactorial degradation, especially involving the chest. 2. 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 Suspect a central embolic source. Consider echocardiography. 3. Cholelithiasis. 4. Small bilateral pleural effusions with bibasilar atelectasis. 5. Cardiomegaly with slight increase in small pericardial effusion. 6.  Aortic atherosclerosis. These results will be called to the ordering clinician or representative by the Radiologist Assistant, and communication documented in the PACS or zVision Dashboard. Electronically Signed   By: Abigail Miyamoto M.D.   On: 01/08/2016 17:54   Ct Abdomen Pelvis W Contrast  Result Date: 01/08/2016 CLINICAL DATA:  Altered mental status. Fever. Cough. Abdominal pain. Diarrhea. Atrial fibrillation on Coumadin. Hypertension. Possible pneumonia or colitis. EXAM: CT CHEST, ABDOMEN, AND PELVIS WITH CONTRAST TECHNIQUE: Multidetector CT imaging of the chest, abdomen and pelvis was performed following the standard protocol during bolus administration of intravenous contrast. CONTRAST:  175mL ISOVUE-300 IOPAMIDOL (ISOVUE-300) INJECTION 61% COMPARISON:  Plain films 01/08/2016. Chest CT 12/15/2015. Abdominopelvic CT 12/29/2015. FINDINGS: CT CHEST FINDINGS Cardiovascular: Aortic and branch vessel atherosclerosis. Tortuous thoracic aorta. Moderate cardiomegaly. Small pericardial effusion is similar to slightly increased since 12/29/2015. No central pulmonary embolism, on this non-dedicated study. Mediastinum/Nodes: Enlargement of a node within the  azygoesophageal recess at 1.4 cm on image 26/series 2. Favored to be reactive, given development over the past 3 weeks. No hilar adenopathy. Tiny hiatal hernia. Lungs/Pleura: Small, right greater than left pleural effusions. Mild to moderate degradation secondary to motion and patient arm position, not raised above the head. Bibasilar atelectasis. Musculoskeletal: Lower thoracic hypoattenuating lesions are likely hemangiomas. CT ABDOMEN PELVIS FINDINGS Hepatobiliary: Hepatomegaly at 22.6 cm. Small gallstones without acute cholecystitis or biliary duct dilatation. Pancreas: Normal, without mass or ductal dilatation. Spleen: Splenic hypoattenuation is wedge-shaped, primarily superiorly on image 49/series 2. New since the prior. Adrenals/Urinary Tract: Normal adrenal glands. Normal left kidney. New hypoattenuation involving the lower pole right kidney is suspicious for infarct, including on image 75/ series 2. No hydronephrosis. Normal urinary bladder. Stomach/Bowel: Normal remainder of the stomach. Colonic stool burden suggests constipation. Normal small bowel. Vascular/Lymphatic: Aortic and branch vessel atherosclerosis. No abdominopelvic adenopathy. Reproductive: Hysterectomy.  No adnexal mass. Other: No significant free fluid. Musculoskeletal: Lumbosacral spondylosis with degenerative disc disease at the lumbosacral junction. IMPRESSION: 1. multifactorial degradation, especially involving the chest. 2. 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 Suspect a central embolic source. Consider echocardiography. 3. Cholelithiasis. 4. Small bilateral pleural effusions with bibasilar atelectasis. 5. Cardiomegaly with slight increase in small pericardial effusion. 6.  Aortic atherosclerosis. These results will be called to the ordering clinician or representative by the Radiologist Assistant, and communication documented in the PACS or zVision Dashboard. Electronically Signed   By: Abigail Miyamoto M.D.   On:  01/08/2016 17:54   Ct Abdomen Pelvis W Contrast  Result Date: 12/29/2015 CLINICAL DATA:  Chronic AFib with worsening fatigue in diarrhea EXAM: CT ABDOMEN AND PELVIS WITH CONTRAST TECHNIQUE: Multidetector CT imaging of the abdomen and pelvis was performed using the standard protocol following bolus administration of intravenous contrast. CONTRAST:  135mL ISOVUE-300 IOPAMIDOL (ISOVUE-300) INJECTION 61% COMPARISON:  05/25/2015 ultrasound, CT 04/08/2010 FINDINGS: Lower chest: Small bilateral right greater than left pleural effusion. Passive atelectasis in the bilateral lower lobes. 2.5 cm cyst or bleb in the right lower lobe. Small bleb posterior left lower lobe. There is cardiomegaly with biatrial enlargement. Small pericardial effusion. Small nodular focus right middle lobe, possibly inflammatory or related to  atelectasis. Hepatobiliary: Mild diffuse decreased density of the hepatic parenchyma consistent with fatty change. No focal hepatic abnormality. The gallbladder is contracted and filled with hyperdense sludge or calcified stones. No biliary dilatation. Pancreas: Unremarkable. No pancreatic ductal dilatation or surrounding inflammatory changes. Spleen: Normal in size without focal abnormality. Adrenals/Urinary Tract: Adrenal glands are within normal limits. Subcentimeter cortical hypodense lesions in the kidneys, too small to further characterize. No hydronephrosis. Bladder is unremarkable Stomach/Bowel: Stomach is nonenlarged. No dilated small bowel to suggest an obstruction. There is no colon wall thickening. The appendix is not well identified but no right lower quadrant inflammation is seen. Vascular/Lymphatic: Aortic atherosclerosis. No enlarged abdominal or pelvic lymph nodes. Reproductive: Status post hysterectomy. No adnexal masses. Other: Trace amount of fluid in the pelvis.  No free air. Musculoskeletal: Degenerative changes of the spine. Probable hemangioma in T11. IMPRESSION: 1. No evidence for  bowel obstruction. No significant colon wall thickening or colon inflammation. 2. Small bilateral pleural effusions, right greater than left. 3. Cardiomegaly with biatrial enlargement. Small pericardial effusion. 4. Mild fatty infiltration of the liver 5. Contracted gallbladder filled with calcified stones Electronically Signed   By: Donavan Foil M.D.   On: 12/29/2015 21:02   Dg Chest Port 1 View  Result Date: 01/09/2016 CLINICAL DATA:  75 year old female with sepsis. EXAM: PORTABLE CHEST 1 VIEW COMPARISON:  Chest radiograph dated 01/08/2016 and CT dated 01/08/2016 FINDINGS: There are small bilateral pleural effusions, left greater right and bibasilar hazy densities compatible with atelectasis versus infiltrate. There is moderate cardiomegaly. No pneumothorax. Osteopenia with degenerative changes of the spine. No acute osseous pathology. IMPRESSION: Small bilateral pleural effusions and bibasilar atelectasis versus infiltrate. Moderate cardiomegaly. Electronically Signed   By: Anner Crete M.D.   On: 01/09/2016 06:24   Dg Chest Port 1 View  Result Date: 12/16/2015 CLINICAL DATA:  Pericardiocentesis for pericardial effusion. EXAM: PORTABLE CHEST 1 VIEW COMPARISON:  CT from 12/15/2015 FINDINGS: There is a catheter noted along the base of the heart with the tip in head the right cardiophrenic angle and base of heart. Mild interstitial prominence is noted without pneumothorax. The cardiac silhouette is enlarged. There is mild aortic atherosclerosis. No pneumonic consolidation or suspicious osseous lesions. IMPRESSION: Catheter tip is seen along the base the heart to the right of midline. Enlarged cardiac silhouette is noted. There is aortic atherosclerosis. No pneumothorax is identified. Electronically Signed   By: [name] Royalty M.D.   On: 12/16/2015 19:24   Dg Abd Acute W/chest  Result Date: 01/08/2016 CLINICAL DATA:  Abdominal pain, weakness, nausea/vomiting EXAM: DG ABDOMEN ACUTE W/ 1V CHEST  COMPARISON:  CT abdomen/pelvis dated 12/29/2015 FINDINGS: Small bilateral pleural effusions. Possible mild perihilar edema. Mild left lower lobe opacity, likely atelectasis. Cardiomegaly. Nonobstructive bowel gas pattern. No evidence of free air on the lateral decubitus view. Mild degenerative changes the visualized thoracolumbar spine. IMPRESSION: Cardiomegaly with possible mild perihilar edema. Small bilateral pleural effusions. Mild left lower lobe opacity, likely atelectasis. No evidence of small bowel obstruction or free air. Electronically Signed   By: Julian Hy M.D.   On: 01/08/2016 13:48    Barton Dubois, MD  [facility] Pager [phone]  If 7PM-7AM, please contact night-coverage [email or web address] Password [email or web address] 01/15/2016, 6:38 PM   LOS: 7 days

## 2016-01-15 NOTE — Care Management Note (Signed)
Case Management Note  Patient Details  Name: [name] MRN: [id number] Date of Birth: Aug 30, 1940  Subjective/Objective:   Sepsis, with HCAP, gastrointestinal illness                 Action/Plan: Discharge Planning: Please see previous NCM notes. Pt will need [facility] RN orders with F2F. Pt is currently [place] (high risk initiative program post dc). Will have [place] RN for more frequent follow up post dc to reduce readmission. Will provide pt with info on private duty agencies.   PCP Leeroy Cha  MD   Expected Discharge Date:                Expected Discharge Plan:  [place]  In-House Referral:  NA  Discharge planning Services  CM Consult  Post Acute Care Choice:  Home Health Choice offered to:  Patient  DME Arranged:  N/A DME Agency:  NA  HH Arranged:  RN, PT [place] Agency:  [facility]  Status of Service:  In process, will continue to follow  If discussed at Long Length of Stay Meetings, dates discussed:    Additional Comments:  Erenest Rasher, RN 01/15/2016, 5:27 PM

## 2016-01-15 NOTE — Progress Notes (Signed)
   Subjective: Pt fatigued after bath   Objective: Vitals:   01/14/16 2146 01/14/16 2153 01/15/16 0500 01/15/16 0517  BP: (!) 112/56   (!) 125/50  Pulse: (!) 120   (!) 121  Resp: 20   20  Temp: 98.1 F (36.7 C)   98 F (36.7 C)  TempSrc: Oral   Oral  SpO2: 98% 98%  97%  Weight:   138 lb 14.2 oz (63 kg)   Height:       Weight change: -3 lb 8.4 oz (-1.6 kg) No intake or output data in the 24 hours ending 01/15/16 0725   I/O  2.2 L positive   General: Alert, awake, oriented x3, in no acute distress Neck:  JVP is normal Heart: Irregular rate and rhythm, without murmurs, rubs, gallops.  Lungs: Clear to auscultation.  No rales or wheezes. Exemities:  No edema.   Neuro: Grossly intact, nonfocal.  Tele  Afib  80s    Lab Results: Results for orders placed or performed during the hospital encounter of 01/08/16 (from the past 24 hour(s))  Protime-INR     Status: Abnormal   Collection Time: 01/15/16  3:37 AM  Result Value Ref Range   Prothrombin Time 22.1 (H) 11.4 - 15.2 seconds   INR 1.90   CBC     Status: Abnormal   Collection Time: 01/15/16  3:37 AM  Result Value Ref Range   WBC 8.5 4.0 - 10.5 K/uL   RBC 3.96 3.87 - 5.11 MIL/uL   Hemoglobin 11.6 (L) 12.0 - 15.0 g/dL   HCT 35.8 (L) 36.0 - 46.0 %   MCV 90.4 78.0 - 100.0 fL   MCH 29.3 26.0 - 34.0 pg   MCHC 32.4 30.0 - 36.0 g/dL   RDW 16.0 (H) 11.5 - 15.5 %   Platelets 424 (H) 150 - 400 K/uL  Basic metabolic panel     Status: Abnormal   Collection Time: 01/15/16  3:37 AM  Result Value Ref Range   Sodium 140 135 - 145 mmol/L   Potassium 3.9 3.5 - 5.1 mmol/L   Chloride 105 101 - 111 mmol/L   CO2 27 22 - 32 mmol/L   Glucose, Bld 90 65 - 99 mg/dL   BUN 10 6 - 20 mg/dL   Creatinine, Ser 0.62 0.44 - 1.00 mg/dL   Calcium 8.7 (L) 8.9 - 10.3 mg/dL   GFR calc non Af Amer >60 >60 mL/min   GFR calc Af Amer >60 >60 mL/min   Anion gap 8 5 - 15    Studies/Results: No results found.  Medications: REviewed     @PROBHOSP @  1  Atrial fibrillation  Rates are fairly well controlled  INcreae with activity  Would follow for now.  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

## 2016-01-16 DIAGNOSIS — D899 Disorder involving the immune mechanism, unspecified: Secondary | ICD-10-CM

## 2016-01-16 DIAGNOSIS — Z7901 Long term (current) use of anticoagulants: Secondary | ICD-10-CM

## 2016-01-16 LAB — CBC
HEMATOCRIT: 40.6 % (ref 36.0–46.0)
Hemoglobin: 12.9 g/dL (ref 12.0–15.0)
MCH: 29.1 pg (ref 26.0–34.0)
MCHC: 31.8 g/dL (ref 30.0–36.0)
MCV: 91.6 fL (ref 78.0–100.0)
PLATELETS: 534 10*3/uL — AB (ref 150–400)
RBC: 4.43 MIL/uL (ref 3.87–5.11)
RDW: 16.3 % — ABNORMAL HIGH (ref 11.5–15.5)
WBC: 9.9 10*3/uL (ref 4.0–10.5)

## 2016-01-16 LAB — PROTIME-INR
INR: 1.93
Prothrombin Time: 22.3 seconds — ABNORMAL HIGH (ref 11.4–15.2)

## 2016-01-16 MED ORDER — POTASSIUM CHLORIDE CRYS ER 10 MEQ PO TBCR: 20.0000 meq | EXTENDED_RELEASE_TABLET | Freq: Every day | ORAL | 1 refills | Status: AC

## 2016-01-16 MED ORDER — WARFARIN SODIUM 5 MG PO TABS
5.0000 mg | ORAL_TABLET | Freq: Once | ORAL | Status: AC
  Administered 2016-01-16: 5 mg via ORAL
  Filled 2016-01-16: qty 1

## 2016-01-16 MED ORDER — METOPROLOL SUCCINATE ER 25 MG PO TB24: 25.0000 mg | ORAL_TABLET | Freq: Two times a day (BID) | ORAL | 1 refills | Status: DC

## 2016-01-16 MED ORDER — PREDNISONE 2.5 MG PO TABS: ORAL_TABLET | ORAL | Status: DC

## 2016-01-16 NOTE — Discharge Summary (Signed)
Physician Discharge Summary  [name] [id number] DOB: October 03, 1940 DOA: 01/08/2016  PCP: Leeroy Cha, MD  Admit date: 01/08/2016 Discharge date: 01/16/2016  Time spent: 35 minutes  Recommendations for Outpatient Follow-up:  Repeat BMET to follow electrolytes and renal function  Reassess volume status and BP; adjust medications as needed  Repeat CXR in 4-6 weeks to assure resolution of patient infiltrates  Discharge Diagnoses:  Principal Problem:   Sepsis ([place]) Active Problems:   Long term current use of anticoagulant therapy   Hypertension   Hyperlipidemia   QT prolongation   Rheumatoid arthritis (HCC)   Pleuritic chest pain   Chronic atrial fibrillation (HCC)   Pulmonary hypertension   Pericardial effusion   Diarrhea   Acute on chronic diastolic CHF (congestive heart failure) ([place])   Immunosuppressed status ([place])   Splenic infarct   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 Discharge home with instructions to follow up with PCP in 2 weeks and with cardiology (appointment to be arranged by cardiology service). Follow up 3-4 days at coumadin clinic as well instructed.  Diet recommendation: heart healthy diet   Filed Weights   01/14/16 0100 01/15/16 0500 01/16/16 0647  Weight: 64.6 kg (142 lb 6.7 oz) 63 kg (138 lb 14.2 oz) 62.5 kg (137 lb 11.2 oz)    History of present illness:  As per Dr. Zollie Scale H&P written on 01/08/16 75 y.o. female with medical history significant of HTN, HLD, Ra, pHTN Atrial Fibrillation, Recent Pericardial Effusion s/p Pericardiocentesis and pericardial drain from recurrent Pneumonias from being immunocompromised from RA, and other comorbids who was recently admitted to [facility] for Sepsis 2/2 to presumed C Diff from 12/11-12/17. Patient was discharged home and did well the first few days but then states lost her appetite and started coughing and had worsening diarrhea over the course of the week. Patient states she  became more fatigued and developed fevers and did not want to come back to the ER at the urge of her daughter. She Admitted to Nausea but no vomiting and states she has chest wall pain and tenderness from coughing. Patient thinks liquidly diarrhea is getting worse and has had countless episodes to the point where she defecates on herself. No blood in stool noted. Has had fevers and chills at home and has worsened since discharge last Sunday. Per daughter has been so weak and has only lying in bed and not been eating. Saw her Cardiologist 4 days ago and states that when she started feeling worse. States feels very similar to last hospitalization. No other concerns or complaints at this time.   Hospital Course:  Sepsis from Health Care Acquired Pneumonia vs. Gastrointestinal Illness -Patient wass Septic on Admission with Fever of 102, Tachycardia, Tachypnea, Leukocytosis of 18.8 and Lactic Acid Level of 4.83. Likely HCAP vs viral infection -PCT--0.12;  -lactate 4.83--->1.3 -Blood Cx's x2 showed No growth up to date  -Urinalysis Negative  -Chest and Abdominal Imaging X-Ray showed Cardiomegaly with possible mild perihilar edema. Small bilateral pleural effusions. Mild left lower lobe opacity, likely atelectasis. No evidence of small bowel obstruction or free air. -WBC went from 18.8 ->12.2 ->8.5>>9.2>>8.5>>9.9 -patient completed 5 days of vancomycin and cefepime -finished also 11 days of metronidazole -Blood culture, urine culture-->> has remained neg -fever partly attributable to new splenic and renal infarct (and her fever is now resolved) -sepsis physiology/features resolved -will recommend repeat CXR in 4-6 weeks to follow complete resolution of infiltrates.  Atrial Fibrillation with RVR:  -CHA2DS2-VASc  Scoreis 3- Anticoagulation with Coumadin -previously held due to Bloody Pericardial Effusion -Per notes was to be started During the First Week of January; However given CT ABD/PELVIS  FINDINGS (with splenic and right renal infarcts) RESTARTED HEPARIN gtt and Warfarain -INR Now Therapeutic  -continue Metoprolol and Verapamil  -HR remains improved with adjustment done by cardiology to her regimen.  -Cardiology followup as an outpatient will be arranged   Acute on Chronic systolic congestive heart failure: -ECHOon 12/29/15 showed EF of 50-55 %. However previous [facility] showed EF of 40-45%.  -BNP was 302.0 -instructed to check weight on daily basis  -Given IV Lasix 12/25,12/26, 12/27 and 12/29; currently appears euvolemic -baseline weight (144-145) -will discharge on lasix 40mg  daily as per cardiology rec's  -patient advise to follow low sodium diet   Diarrhea -Enteric Percautions--discontinued as the patient has not had any further diarrhea -CT of Abd/Pelvis showed Splenic and Right Renal Infarcts which are new.  -Stomach/Bowel: Normal remainder of the stomach. Colonic stool burden suggests constipation. Normal small bowel. -GI Stool Panel Negative -advise to follow adequate hydration and to use PRN miralax for constipation   Acute respiratory failure with hypoxia -secondary to PNA and CHF -resolved -now stable O2 sat on RA  Pericardial Effusion:  -S/p of pericardiocentesis and Pericardial drain placement with removal of 500 mL of Dark Sanguinous Fluid during the Admission of 11/29-12/3. -CT of the Chest showed Cardiomegaly with slight increase in small pericardial effusion. -01/09/16--ECHOCardiogram did not show Pericardial Effusion. -12-29--Echo with trivial effusion; EF 45-50%; without significant fluid accumulation before discharge. -will follow up with cardiology as an outpatient   Hypotension -BP soft, but stable -no really further hypotensive events prior to discharge  Visual Hallucinations -Head CT Negative and showed No acute intracranial abnormality. Stable mild brain parenchymal atrophy and chronic microvascular disease. -Avoid medications  that can altered sensorium  -resolved  Hypokalemia -Repleted -patient Mg 2.0 -BMET at follow up to follow electrolytes recommended   Mild Hyponatremia -Improved/resolved. -recommend BMET at follow up to reassess electrolytes   Atypical CP  -Troponin I x 3 Negative -no EKG or telemetry changes for ischemia   Generalized Weakness and Deconditioning -PT/OT Evaluation completed and patient found to be safe to return home w/o PT follow up.  Rheumatoid Arthritis:  -On prednisone 2.5 mg daily at home--on stress dose presently; but with plans to resume chronic dose in 1 week. -resuming Home Methotrexate and Etanercept.  -patient overall clinically stable and no complaining of joint pain falres  HLD: -Continue Pravastatin   Procedures:  See below for x-ray reports   As Listed in Progress Note Above  Consultations:  Cardiology   Discharge Exam: Vitals:   01/16/16 0733 01/16/16 1459  BP:  (!) 124/54  Pulse: (!) 112 74  Resp:  18  Temp:  97.9 F (36.6 C)    General:  Pt is alert, follows commands appropriately, not in acute distress; afebrile and deneis CP and SOB. Reports feeling weak, but otherwise doing ok. No reports of palpitations and HR is much better controlled.  HEENT: No icterus, No thrush, No neck mass, [place]/AT  Cardiovascular: Rate controlled; no rubs, no gallops  Respiratory: Good air movement, no wheezing, no crackles and currently not using accessory muscles   Abdomen: Soft/+BS, non tender, non distended, no guarding  Extremities: No edema, No lymphangitis, No petechiae, No rashes, no synovitis   Discharge Instructions   Discharge Instructions    Diet - low sodium heart healthy    Complete by:  As directed    Discharge instructions    Complete by:  As directed    Take medications as prescribed Follow up with coumadin clinic in the next 3 days or so; to have coumadin level recheck and your coumadin level adjusted as needed. Follow up with  PCP in 2 weeks Follow up with cardiology as instructed (office will contact you with appointment details) Maintain adequate hydration Follow low sodium diet (2-2.5 gram daily) Check weight on daily basis     Current Discharge Medication List    START taking these medications   Details  !! metoprolol succinate (TOPROL XL) 25 MG 24 hr tablet Take 1 tablet (25 mg total) by mouth 2 (two) times daily. To take along with metoprolol XL 100mg  for a total of 125 mg BID Qty: 60 tablet, Refills: 1     !! - Potential duplicate medications found. Please discuss with provider.    CONTINUE these medications which have CHANGED   Details  potassium chloride (K-DUR,KLOR-CON) 10 MEQ tablet Take 2 tablets (20 mEq total) by mouth daily. Qty: 60 tablet, Refills: 1    predniSONE (DELTASONE) 2.5 MG tablet Take 5 mg daily for 1 week; then resume daily dose of 2.5mg  daily      CONTINUE these medications which have NOT CHANGED   Details  acidophilus (RISAQUAD) CAPS capsule Take 1 capsule by mouth daily.    cholecalciferol (VITAMIN D) 1000 UNITS tablet Take 2,000 Units by mouth daily.    fluticasone (FLONASE) 50 MCG/ACT nasal spray Place 2 sprays into both nostrils daily as needed for rhinitis.     folic acid (FOLVITE) 1 MG tablet Take 2 mg by mouth daily.     furosemide (LASIX) 40 MG tablet Take 40 mg by mouth every evening.    guaiFENesin (MUCINEX) 600 MG 12 hr tablet Take 600 mg by mouth 2 (two) times daily as needed for cough or to loosen phlegm.     methotrexate (RHEUMATREX) 2.5 MG tablet Take 5-7.5 mg by mouth 2 (two) times a week. Pt takes three tablets every Tuesday and two tablets every Wednesday. Refills: 1    !! metoprolol succinate (TOPROL-XL) 100 MG 24 hr tablet Take 1 tablet (100 mg total) by mouth 2 (two) times daily. Take with or immediately following a meal. Qty: 60 tablet, Refills: 0    Multiple Vitamins-Minerals (MULTIVITAMIN WITH MINERALS) tablet Take 1 tablet by mouth daily.     pravastatin (PRAVACHOL) 20 MG tablet Take 20 mg by mouth at bedtime.    ranitidine (ZANTAC) 150 MG tablet Take 1 tablet (150 mg total) by mouth at bedtime. Qty: 30 tablet, Refills: 0    verapamil (CALAN) 120 MG tablet Take 1 tablet (120 mg total) by mouth every 12 (twelve) hours. Qty: 60 tablet, Refills: 0    warfarin (COUMADIN) 5 MG tablet Take 1 tablet (5 mg total) by mouth daily. Qty: 90 tablet, Refills: 3     !! - Potential duplicate medications found. Please discuss with provider.    STOP taking these medications     metroNIDAZOLE (FLAGYL) 500 MG tablet        Allergies  Allergen Reactions  . Arthrotec [Diclofenac-Misoprostol] Diarrhea  . Erythromycin Anaphylaxis       . Morphine Sulfate Other (See Comments)    Reaction:  Hallucinations   . Amoxicillin Rash and Other (See Comments)    Has patient had a PCN reaction causing immediate rash, facial/tongue/throat swelling, SOB or lightheadedness with hypotension: Yes Has patient had  a PCN reaction causing severe rash involving mucus membranes or skin necrosis: Yes Has patient had a PCN reaction that required hospitalization No Has patient had a PCN reaction occurring within the last 10 years: No If all of the above answers are "NO", then may proceed with Cephalosporin use.  . Diltiazem Hcl Swelling, Rash and Other (See Comments)    Pt is able to tolerate Verapamil.    . Gold-Containing Drug Products Rash  . Macrodantin [Nitrofurantoin Macrocrystal] Rash  . Naproxen Rash  . Penicillins Rash and Other (See Comments)    ++ tolerates cefepime and ceftriaxone ++ Has patient had a PCN reaction causing immediate rash, facial/tongue/throat swelling, SOB or lightheadedness with hypotension: Yes Has patient had a PCN reaction causing severe rash involving mucus membranes or skin necrosis: Yes Has patient had a PCN reaction that required hospitalization No Has patient had a PCN reaction occurring within the last 10 years: No If  all of the above answers are "NO", then may proceed with Cephalosporin use.    Follow-up Information    Leeroy Cha, MD. Schedule an appointment as soon as possible for a visit in 2 week(s).   Specialty:  Internal Medicine Contact information: [address] [place] [address] [phone]           The results of significant diagnostics from this hospitalization (including imaging, microbiology, ancillary and laboratory) are listed below for reference.    Significant Diagnostic Studies: Dg Chest 1 View  Result Date: 01/10/2016 CLINICAL DATA:  Cough. EXAM: CHEST 1 VIEW COMPARISON:  01/09/2016 FINDINGS: Enlargement of the cardiac silhouette is unchanged. Lung volumes are diminished, more so than on the prior study. There is peribronchial cuffing and diffuse interstitial accentuation which have increased from the prior study. Patchy opacities in both lung bases have also increased. Small pleural effusions persist. No pneumothorax is seen. No acute osseous abnormality is identified. IMPRESSION: 1. Cardiomegaly with likely mild interstitial edema and small pleural effusions. 2. Low lung volumes with increased bibasilar opacities, likely atelectasis. Electronically Signed   By: Logan Bores M.D.   On: 01/10/2016 06:42   Dg Chest 2 View  Result Date: 12/27/2015 CLINICAL DATA:  Fever shin EXAM: CHEST  2 VIEW COMPARISON:  12/16/2015 FINDINGS: Cardiac shadow remains enlarged. The lungs are well aerated bilaterally. No focal infiltrate or sizable effusion is seen. Previously seen pericardial catheter has been removed. Degenerative change of the thoracic spine is noted. IMPRESSION: No active cardiopulmonary disease. Electronically Signed   By: Inez Catalina M.D.   On: 12/27/2015 21:23   Ct Head Wo Contrast  Result Date: 01/08/2016 CLINICAL DATA:  Fever with altered mental status. EXAM: CT HEAD WITHOUT CONTRAST TECHNIQUE: Contiguous axial images were obtained from the  base of the skull through the vertex without intravenous contrast. COMPARISON:  12/28/2015 FINDINGS: Brain: No evidence of acute infarction, hemorrhage, hydrocephalus, extra-axial collection or mass lesion/mass effect. Stable mild brain parenchymal atrophy and microangiopathic changes in the deep white matter. Vascular: No hyperdense vessel or unexpected calcification. Skull: Normal. Negative for fracture or focal lesion. Sinuses/Orbits: No acute finding. Other: None. IMPRESSION: No acute intracranial abnormality. Stable mild brain parenchymal atrophy and chronic microvascular disease. Electronically Signed   By: Fidela Salisbury M.D.   On: 01/08/2016 17:33   Ct Head Wo Contrast  Result Date: 12/28/2015 CLINICAL DATA:  75 year old female with fall.  Generalized weakness. EXAM: CT HEAD WITHOUT CONTRAST TECHNIQUE: Contiguous axial images were obtained from the base of the skull through  the vertex without intravenous contrast. COMPARISON:  Head CT dated 05/23/2015 FINDINGS: Brain: The ventricles and sulci are appropriate in size for patient's age. Mild periventricular and deep white matter chronic microvascular ischemic changes noted. There is no acute intracranial hemorrhage. No mass effect or midline shift noted. Vascular: No hyperdense vessel or unexpected calcification. Skull: Normal. Negative for fracture or focal lesion. Sinuses/Orbits: No acute finding. Other: None IMPRESSION: No acute intracranial pathology. Mild age-related atrophy and chronic microvascular ischemic changes. Electronically Signed   By: Anner Crete M.D.   On: 12/28/2015 02:04   Ct Chest W Contrast  Result Date: 01/08/2016 CLINICAL DATA:  Altered mental status. Fever. Cough. Abdominal pain. Diarrhea. Atrial fibrillation on Coumadin. Hypertension. Possible pneumonia or colitis. EXAM: CT CHEST, ABDOMEN, AND PELVIS WITH CONTRAST TECHNIQUE: Multidetector CT imaging of the chest, abdomen and pelvis was performed following the  standard protocol during bolus administration of intravenous contrast. CONTRAST:  123mL ISOVUE-300 IOPAMIDOL (ISOVUE-300) INJECTION 61% COMPARISON:  Plain films 01/08/2016. Chest CT 12/15/2015. Abdominopelvic CT 12/29/2015. FINDINGS: CT CHEST FINDINGS Cardiovascular: Aortic and branch vessel atherosclerosis. Tortuous thoracic aorta. Moderate cardiomegaly. Small pericardial effusion is similar to slightly increased since 12/29/2015. No central pulmonary embolism, on this non-dedicated study. Mediastinum/Nodes: Enlargement of a node within the azygoesophageal recess at 1.4 cm on image 26/series 2. Favored to be reactive, given development over the past 3 weeks. No hilar adenopathy. Tiny hiatal hernia. Lungs/Pleura: Small, right greater than left pleural effusions. Mild to moderate degradation secondary to motion and patient arm position, not raised above the head. Bibasilar atelectasis. Musculoskeletal: Lower thoracic hypoattenuating lesions are likely hemangiomas. CT ABDOMEN PELVIS FINDINGS Hepatobiliary: Hepatomegaly at 22.6 cm. Small gallstones without acute cholecystitis or biliary duct dilatation. Pancreas: Normal, without mass or ductal dilatation. Spleen: Splenic hypoattenuation is wedge-shaped, primarily superiorly on image 49/series 2. New since the prior. Adrenals/Urinary Tract: Normal adrenal glands. Normal left kidney. New hypoattenuation involving the lower pole right kidney is suspicious for infarct, including on image 75/ series 2. No hydronephrosis. Normal urinary bladder. Stomach/Bowel: Normal remainder of the stomach. Colonic stool burden suggests constipation. Normal small bowel. Vascular/Lymphatic: Aortic and branch vessel atherosclerosis. No abdominopelvic adenopathy. Reproductive: Hysterectomy.  No adnexal mass. Other: No significant free fluid. Musculoskeletal: Lumbosacral spondylosis with degenerative disc disease at the lumbosacral junction. IMPRESSION: 1. multifactorial degradation,  especially involving the chest. 2. 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 Suspect a central embolic source. Consider echocardiography. 3. Cholelithiasis. 4. Small bilateral pleural effusions with bibasilar atelectasis. 5. Cardiomegaly with slight increase in small pericardial effusion. 6.  Aortic atherosclerosis. These results will be called to the ordering clinician or representative by the Radiologist Assistant, and communication documented in the PACS or zVision Dashboard. Electronically Signed   By: Abigail Miyamoto M.D.   On: 01/08/2016 17:54   Ct Abdomen Pelvis W Contrast  Result Date: 01/08/2016 CLINICAL DATA:  Altered mental status. Fever. Cough. Abdominal pain. Diarrhea. Atrial fibrillation on Coumadin. Hypertension. Possible pneumonia or colitis. EXAM: CT CHEST, ABDOMEN, AND PELVIS WITH CONTRAST TECHNIQUE: Multidetector CT imaging of the chest, abdomen and pelvis was performed following the standard protocol during bolus administration of intravenous contrast. CONTRAST:  171mL ISOVUE-300 IOPAMIDOL (ISOVUE-300) INJECTION 61% COMPARISON:  Plain films 01/08/2016. Chest CT 12/15/2015. Abdominopelvic CT 12/29/2015. FINDINGS: CT CHEST FINDINGS Cardiovascular: Aortic and branch vessel atherosclerosis. Tortuous thoracic aorta. Moderate cardiomegaly. Small pericardial effusion is similar to slightly increased since 12/29/2015. No central pulmonary embolism, on this non-dedicated study. Mediastinum/Nodes: Enlargement of a node within the azygoesophageal recess  at 1.4 cm on image 26/series 2. Favored to be reactive, given development over the past 3 weeks. No hilar adenopathy. Tiny hiatal hernia. Lungs/Pleura: Small, right greater than left pleural effusions. Mild to moderate degradation secondary to motion and patient arm position, not raised above the head. Bibasilar atelectasis. Musculoskeletal: Lower thoracic hypoattenuating lesions are likely hemangiomas. CT ABDOMEN PELVIS FINDINGS  Hepatobiliary: Hepatomegaly at 22.6 cm. Small gallstones without acute cholecystitis or biliary duct dilatation. Pancreas: Normal, without mass or ductal dilatation. Spleen: Splenic hypoattenuation is wedge-shaped, primarily superiorly on image 49/series 2. New since the prior. Adrenals/Urinary Tract: Normal adrenal glands. Normal left kidney. New hypoattenuation involving the lower pole right kidney is suspicious for infarct, including on image 75/ series 2. No hydronephrosis. Normal urinary bladder. Stomach/Bowel: Normal remainder of the stomach. Colonic stool burden suggests constipation. Normal small bowel. Vascular/Lymphatic: Aortic and branch vessel atherosclerosis. No abdominopelvic adenopathy. Reproductive: Hysterectomy.  No adnexal mass. Other: No significant free fluid. Musculoskeletal: Lumbosacral spondylosis with degenerative disc disease at the lumbosacral junction. IMPRESSION: 1. multifactorial degradation, especially involving the chest. 2. 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 Suspect a central embolic source. Consider echocardiography. 3. Cholelithiasis. 4. Small bilateral pleural effusions with bibasilar atelectasis. 5. Cardiomegaly with slight increase in small pericardial effusion. 6.  Aortic atherosclerosis. These results will be called to the ordering clinician or representative by the Radiologist Assistant, and communication documented in the PACS or zVision Dashboard. Electronically Signed   By: Abigail Miyamoto M.D.   On: 01/08/2016 17:54   Ct Abdomen Pelvis W Contrast  Result Date: 12/29/2015 CLINICAL DATA:  Chronic AFib with worsening fatigue in diarrhea EXAM: CT ABDOMEN AND PELVIS WITH CONTRAST TECHNIQUE: Multidetector CT imaging of the abdomen and pelvis was performed using the standard protocol following bolus administration of intravenous contrast. CONTRAST:  117mL ISOVUE-300 IOPAMIDOL (ISOVUE-300) INJECTION 61% COMPARISON:  05/25/2015 ultrasound, CT 04/08/2010  FINDINGS: Lower chest: Small bilateral right greater than left pleural effusion. Passive atelectasis in the bilateral lower lobes. 2.5 cm cyst or bleb in the right lower lobe. Small bleb posterior left lower lobe. There is cardiomegaly with biatrial enlargement. Small pericardial effusion. Small nodular focus right middle lobe, possibly inflammatory or related to atelectasis. Hepatobiliary: Mild diffuse decreased density of the hepatic parenchyma consistent with fatty change. No focal hepatic abnormality. The gallbladder is contracted and filled with hyperdense sludge or calcified stones. No biliary dilatation. Pancreas: Unremarkable. No pancreatic ductal dilatation or surrounding inflammatory changes. Spleen: Normal in size without focal abnormality. Adrenals/Urinary Tract: Adrenal glands are within normal limits. Subcentimeter cortical hypodense lesions in the kidneys, too small to further characterize. No hydronephrosis. Bladder is unremarkable Stomach/Bowel: Stomach is nonenlarged. No dilated small bowel to suggest an obstruction. There is no colon wall thickening. The appendix is not well identified but no right lower quadrant inflammation is seen. Vascular/Lymphatic: Aortic atherosclerosis. No enlarged abdominal or pelvic lymph nodes. Reproductive: Status post hysterectomy. No adnexal masses. Other: Trace amount of fluid in the pelvis.  No free air. Musculoskeletal: Degenerative changes of the spine. Probable hemangioma in T11. IMPRESSION: 1. No evidence for bowel obstruction. No significant colon wall thickening or colon inflammation. 2. Small bilateral pleural effusions, right greater than left. 3. Cardiomegaly with biatrial enlargement. Small pericardial effusion. 4. Mild fatty infiltration of the liver 5. Contracted gallbladder filled with calcified stones Electronically Signed   By: Donavan Foil M.D.   On: 12/29/2015 21:02   Dg Chest Port 1 View  Result Date: 01/09/2016 CLINICAL DATA:  75 year old  female with sepsis. EXAM: PORTABLE CHEST 1 VIEW COMPARISON:  Chest radiograph dated 01/08/2016 and CT dated 01/08/2016 FINDINGS: There are small bilateral pleural effusions, left greater right and bibasilar hazy densities compatible with atelectasis versus infiltrate. There is moderate cardiomegaly. No pneumothorax. Osteopenia with degenerative changes of the spine. No acute osseous pathology. IMPRESSION: Small bilateral pleural effusions and bibasilar atelectasis versus infiltrate. Moderate cardiomegaly. Electronically Signed   By: Anner Crete M.D.   On: 01/09/2016 06:24   Dg Abd Acute W/chest  Result Date: 01/08/2016 CLINICAL DATA:  Abdominal pain, weakness, nausea/vomiting EXAM: DG ABDOMEN ACUTE W/ 1V CHEST COMPARISON:  CT abdomen/pelvis dated 12/29/2015 FINDINGS: Small bilateral pleural effusions. Possible mild perihilar edema. Mild left lower lobe opacity, likely atelectasis. Cardiomegaly. Nonobstructive bowel gas pattern. No evidence of free air on the lateral decubitus view. Mild degenerative changes the visualized thoracolumbar spine. IMPRESSION: Cardiomegaly with possible mild perihilar edema. Small bilateral pleural effusions. Mild left lower lobe opacity, likely atelectasis. No evidence of small bowel obstruction or free air. Electronically Signed   By: Julian Hy M.D.   On: 01/08/2016 13:48    Microbiology: Recent Results (from the past 240 hour(s))  Blood culture (routine x 2)     Status: None   Collection Time: 01/08/16  2:14 PM  Result Value Ref Range Status   Specimen Description BLOOD LEFT FOREARM  Final   Special Requests IN PEDIATRIC BOTTLE 5CC  Final   Culture   Final    NO GROWTH 5 DAYS Performed at [facility]    Report Status 01/13/2016 FINAL  Final  Urine culture     Status: None   Collection Time: 01/08/16  3:07 PM  Result Value Ref Range Status   Specimen Description URINE, CATHETERIZED  Final   Special Requests NONE  Final   Culture NO  GROWTH Performed at [facility]   Final   Report Status 01/10/2016 FINAL  Final  Blood culture (routine x 2)     Status: None   Collection Time: 01/08/16  3:15 PM  Result Value Ref Range Status   Specimen Description BLOOD RIGHT ANTECUBITAL  Final   Special Requests BOTTLES DRAWN AEROBIC AND ANAEROBIC 5CC  Final   Culture   Final    NO GROWTH 5 DAYS Performed at [facility]    Report Status 01/13/2016 FINAL  Final  Culture, blood (x 2)     Status: None   Collection Time: 01/08/16  6:48 PM  Result Value Ref Range Status   Specimen Description BLOOD LEFT ANTECUBITAL  Final   Special Requests IN PEDIATRIC BOTTLE [facility]  Final   Culture   Final    NO GROWTH 5 DAYS Performed at [facility]    Report Status 01/14/2016 FINAL  Final  Urine culture     Status: Abnormal   Collection Time: 01/08/16  8:12 PM  Result Value Ref Range Status   Specimen Description URINE, RANDOM  Final   Special Requests NONE  Final   Culture MULTIPLE SPECIES PRESENT, SUGGEST RECOLLECTION (A)  Final   Report Status 01/10/2016 FINAL  Final  Culture, blood (x 2)     Status: None   Collection Time: 01/08/16 11:04 PM  Result Value Ref Range Status   Specimen Description BLOOD BLOOD LEFT FOREARM  Final   Special Requests IN PEDIATRIC BOTTLE [place]  Final   Culture   Final    NO GROWTH 5 DAYS Performed at [facility]    Report  Status 01/14/2016 FINAL  Final  Gastrointestinal Panel by PCR , Stool     Status: None   Collection Time: 01/09/16  3:02 PM  Result Value Ref Range Status   Campylobacter species NOT DETECTED NOT DETECTED Final   Plesimonas shigelloides NOT DETECTED NOT DETECTED Final   Salmonella species NOT DETECTED NOT DETECTED Final   Yersinia enterocolitica NOT DETECTED NOT DETECTED Final   Vibrio species NOT DETECTED NOT DETECTED Final   Vibrio cholerae NOT DETECTED NOT DETECTED Final   Enteroaggregative E coli (EAEC) NOT DETECTED NOT DETECTED Final    Enteropathogenic E coli (EPEC) NOT DETECTED NOT DETECTED Final   Enterotoxigenic E coli (ETEC) NOT DETECTED NOT DETECTED Final   Shiga like toxin producing E coli (STEC) NOT DETECTED NOT DETECTED Final   Shigella/Enteroinvasive E coli (EIEC) NOT DETECTED NOT DETECTED Final   Cryptosporidium NOT DETECTED NOT DETECTED Final   Cyclospora cayetanensis NOT DETECTED NOT DETECTED Final   Entamoeba histolytica NOT DETECTED NOT DETECTED Final   Giardia lamblia NOT DETECTED NOT DETECTED Final   Adenovirus F40/41 NOT DETECTED NOT DETECTED Final   Astrovirus NOT DETECTED NOT DETECTED Final   Norovirus GI/GII NOT DETECTED NOT DETECTED Final   Rotavirus A NOT DETECTED NOT DETECTED Final   Sapovirus (I, II, IV, and V) NOT DETECTED NOT DETECTED Final     Labs: Basic Metabolic Panel:  Recent Labs Lab 01/10/16 0212 01/11/16 0335 01/12/16 0401 01/13/16 0332 01/14/16 0330 01/15/16 0337  NA 133* 137 137 141 138 140  K 4.0 3.3* 3.3* 3.9 3.5 3.9  CL 104 108 109 108 108 105  CO2 23 25 24 26 24 27   GLUCOSE 136* 132* 93 125* 97 90  BUN 10 11 11 8 11 10   CREATININE 0.79 0.63 0.56 0.60 0.63 0.62  CALCIUM 8.3* 8.4* 8.1* 8.7* 8.3* 8.7*  MG 1.8 2.0 1.9 2.0  --   --   PHOS 2.7 2.9 3.0  --   --   --    Liver Function Tests:  Recent Labs Lab 01/10/16 0212 01/11/16 0335  AST 28 26  ALT 24 22  ALKPHOS 53 53  BILITOT 0.5 0.5  PROT 5.7* 5.7*  ALBUMIN 2.8* 2.7*   CBC:  Recent Labs Lab 01/10/16 0212  01/12/16 0401 01/13/16 0332 01/14/16 0330 01/15/16 0337 01/16/16 0502  WBC 12.2*  < > 8.5 8.0 9.2 8.5 9.9  NEUTROABS 10.7*  --   --   --   --   --   --   HGB 10.3*  < > 10.0* 10.4* 11.4* 11.6* 12.9  HCT 31.6*  < > 30.8* 31.8* 34.5* 35.8* 40.6  MCV 91.3  < > 91.4 90.6 89.8 90.4 91.6  PLT 234  < > 234 297 380 424* 534*  < > = values in this interval not displayed.  BNP (last 3 results)  Recent Labs  12/27/15 2025 01/08/16 1257 01/08/16 1848  BNP 283.3* 120.8* 302.0*    ProBNP (last 3  results)  Recent Labs  12/13/15 1704  PROBNP 215.0*    Signed:  Barton Dubois MD.  [facility] 01/16/2016, 4:22 PM

## 2016-01-16 NOTE — Progress Notes (Signed)
Occupational Therapy Evaluation Patient Details Name: [name] MRN: [id number] DOB: February 03, 1940 Today's Date: 01/16/2016    History of Present Illness Pt admittedd to [facility] on 01/08/2016 with weakness and diarrhea with diagnosis of sepsis.  She has had recent hospitalizations and procedures.  She states she want to get the atrial fibrillation under control . She says she is on 5 heart pills. History includes RA, but she hasn't been able to take her medicine becasue she has been sick    Clinical Impression   Patient presents to OT with decreased ADL independence and safety due to weakness and deconditioning. Will benefit from skilled OT to maximize function and to facilitate a safe discharge. OT will follow.    Follow Up Recommendations  No OT follow up    Equipment Recommendations  None recommended by OT    Recommendations for Other Services       Precautions / Restrictions Precautions Precautions: Other (comment) Precaution Comments: watch heart rate  Restrictions Weight Bearing Restrictions: No      Mobility Bed Mobility               General bed mobility comments: NT -- pt already OOB in recliner upon arrival  Transfers Overall transfer level: Needs assistance Equipment used: Rolling walker (2 wheeled) Transfers: Sit to/from Stand Sit to Stand: Supervision         General transfer comment: nees supervision for safety    Balance                                            ADL Overall ADL's : Needs assistance/impaired Eating/Feeding: Independent;Sitting   Grooming: Supervision/safety;Standing   Upper Body Bathing: Supervision/ safety;Standing   Lower Body Bathing: Supervison/ safety;Sit to/from stand   Upper Body Dressing : Set up;Supervision/safety;Standing Upper Body Dressing Details (indicate cue type and reason): don gown as robe Lower Body Dressing: Supervision/safety;Sitting/lateral leans Lower Body Dressing Details  (indicate cue type and reason): don/doff socks Toilet Transfer: Supervision/safety;Ambulation;RW   Toileting- Clothing Manipulation and Hygiene: Supervision/safety;Sit to/from stand       Functional mobility during ADLs: Supervision/safety;Rolling walker       Vision     Perception     Praxis      Pertinent Vitals/Pain Pain Assessment: No/denies pain     Hand Dominance Right   Extremity/Trunk Assessment Upper Extremity Assessment Upper Extremity Assessment: Generalized weakness   Lower Extremity Assessment Lower Extremity Assessment: Generalized weakness   Cervical / Trunk Assessment Cervical / Trunk Assessment: Normal   Communication Communication Communication: No difficulties   Cognition Arousal/Alertness: Awake/alert Behavior During Therapy: WFL for tasks assessed/performed Overall Cognitive Status: Within Functional Limits for tasks assessed                     General Comments       Exercises       Shoulder Instructions      Home Living Family/patient expects to be discharged to:: Private residence Living Arrangements: Spouse/significant other Available Help at Discharge: Family Type of Home: House Home Access: Stairs to enter Technical brewer of Steps: 1 Entrance Stairs-Rails: None Home Layout: One level     Bathroom Shower/Tub: Teacher,  years/pre: Standard         Additional Comments: 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 Daughter has been helping pt's husband      Prior Functioning/Environment Level of Independence: Independent                 OT Problem List: Decreased strength;Decreased activity tolerance;Impaired balance (sitting and/or standing)   OT Treatment/Interventions: Self-care/ADL training;DME and/or AE instruction;Therapeutic activities;Patient/family  education    OT Goals(Current goals can be found in the care plan section) Acute Rehab OT Goals Patient Stated Goal: to go home OT Goal Formulation: With patient Time For Goal Achievement: 01/30/16 Potential to Achieve Goals: Good ADL Goals Pt Will Perform Upper Body Bathing: with modified independence;standing Pt Will Perform Lower Body Bathing: with modified independence;sit to/from stand Pt Will Perform Upper Body Dressing: with modified independence;sitting Pt Will Perform Lower Body Dressing: with modified independence;sit to/from stand Pt Will Transfer to Toilet: with modified independence;ambulating Pt Will Perform Toileting - Clothing Manipulation and hygiene: with modified independence;sit to/from stand  OT Frequency: Min 2X/week   Barriers to D/C:            Co-evaluation              End of Session Equipment Utilized During Treatment: Rolling walker  Activity Tolerance: Patient tolerated treatment well Patient left: in chair;with call bell/phone within reach   Time: 0856-0908 OT Time Calculation (min): 12 min Charges:  OT General Charges $OT Visit: 1 Procedure OT Evaluation $OT Eval Low Complexity: 1 Procedure G-Codes:    Endya Austin A 20-Jan-2016, 9:17 AM

## 2016-01-16 NOTE — Progress Notes (Signed)
Pt discharged to home. DC instructions given with daughter at bedside. No concerns voiced.pt encouraged to stop by [facility] off [address] and pick up meds that were e-prescribed. Home RN not set up by CM prior to DC. Spoke with Jeannene Patella, charge RN, who is going to f/u with CM tomorrow and have CM contact patient at home with necessary information. Pt and daughter made aware. Both voiced understanding. Pt left in wheelchair pushed by nurse tech and accompanied by daughter. Left in good condition. VWilliams,rn.

## 2016-01-16 NOTE — Progress Notes (Signed)
ANTICOAGULATION CONSULT NOTE - Follow Up Consult  Pharmacy Consult for warfarin Indication: hx atrial fibrillation; new renal and splenic infarcts  Patient Measurements: Height: 5\' 3"  (160 cm) Weight: 137 lb 11.2 oz (62.5 kg) IBW/kg (Calculated) : 52.4 Heparin Dosing Weight: 66 kg  Vital Signs: Temp: 98 F (36.7 C) (12/31 0647) Temp Source: Oral (12/31 0647) BP: 122/51 (12/31 0647) Pulse Rate: 112 (12/31 0733)  Labs:  Recent Labs  01/14/16 0330 01/15/16 0337 01/16/16 0502  HGB 11.4* 11.6* 12.9  HCT 34.5* 35.8* 40.6  PLT 380 424* 534*  LABPROT 24.7* 22.1* 22.3*  INR 2.18 1.90 1.93  CREATININE 0.63 0.62  --     Estimated Creatinine Clearance: 51 mL/min (by C-G formula based on SCr of 0.62 mg/dL).   Medications:  Last warfarin regimen (per [facility] clinic note on 11/10): 5 mg daily except 2.5 mg on Wednesdays  Infusions: . sodium chloride Stopped (01/14/16 0800)     Assessment: Patient is a 75 y.o F with hx pericardial effusion in in November 2017 (s/p Pericardiocentesis and pericardial drain) and afib on warfarin PTA.  Outpatient cardiology note on 12/19 indicated that warfarin was placed on hold with plan to resume "during the first week of January" d/t bloody pericardial effusion.  She presented to the ED in 12/23 with c/o fatigue, hallucination and nausea.  Abd CT on 12/23 showed new renal splenic and right renal infarcts.  Heparin started and warfarin resumed on 12/23 for afib and infarcts.    Today, 01/16/2016: INR improving but not yet therapeutic.   Now off Flagyl (LD 12/28), off cefepime/vanco (LD 12/28) On Heart Healthy diet, charted as eating 100% of trays during last 24 hours H/H, pltc elevated No bleeding reported With antibiotics (especially Flagyl) now discontinued, patient may tolerate warfarin doses closer to her previous usual regimen.  Anticoagulation clinic records show INR was previously stable on warfarin 5 mg daily except 2.5 mg Thursdays. - Goal of  Therapy:  INR 2-3    Plan:  1. Warfarin 5 mg PO today at 6pm. 2.  PT/INR daily while inpatient 3.  Monitor CBC and for reports of bleeding.  Clayburn Pert, PharmD, BCPS Pager: [phone] 01/16/2016  10:34 AM

## 2016-01-16 NOTE — Progress Notes (Signed)
   Subjective: Some SOB with walking  No CP   Objective: Vitals:   01/15/16 2103 01/15/16 2221 01/16/16 0647 01/16/16 0733  BP:  (!) 109/59 (!) 122/51   Pulse:  (!) 116 (!) 54 (!) 112  Resp:  18 20   Temp:  98.4 F (36.9 C) 98 F (36.7 C)   TempSrc:  Oral Oral   SpO2: 94% 98% 97%   Weight:   137 lb 11.2 oz (62.5 kg)   Height:       Weight change: -1 lb 3 oz (-0.54 kg)  Intake/Output Summary (Last 24 hours) at 01/16/16 0738 Last data filed at 01/16/16 0100  Gross per 24 hour  Intake             1800 ml  Output              350 ml  Net             1450 ml    General: Alert, awake, oriented x3, in no acute distress Neck:  JVP is normal Heart: Irregular rate and rhythm, without murmurs, rubs, gallops.  Lungs: REl clear  Some decreased airflow  Exemities:  No edema.   Neuro: Grossly intact, nonfocal.  Tele:  Afib 80s    Lab Results: Results for orders placed or performed during the hospital encounter of 01/08/16 (from the past 24 hour(s))  Protime-INR     Status: Abnormal   Collection Time: 01/16/16  5:02 AM  Result Value Ref Range   Prothrombin Time 22.3 (H) 11.4 - 15.2 seconds   INR 1.93   CBC     Status: Abnormal   Collection Time: 01/16/16  5:02 AM  Result Value Ref Range   WBC 9.9 4.0 - 10.5 K/uL   RBC 4.43 3.87 - 5.11 MIL/uL   Hemoglobin 12.9 12.0 - 15.0 g/dL   HCT 40.6 36.0 - 46.0 %   MCV 91.6 78.0 - 100.0 fL   MCH 29.1 26.0 - 34.0 pg   MCHC 31.8 30.0 - 36.0 g/dL   RDW 16.3 (H) 11.5 - 15.5 %   Platelets 534 (H) 150 - 400 K/uL    Studies/Results: No results found.  Medications: Reviewed   @PROBHOSP @  1  Atrial fib  Rates controlled  Continue coumadin  2  Acute on chronic diastolic CHF Volume status is not bad  3 Pericardial effusion  S/p drainage   Echo on 12/29 with trivial effusion  LVEF 50%  I would continue medical Rx   Will make sure pt has f/u in cardiology in next couple wks.   OK to d/c on anticoagulation    LOS: 8 days   Dorris Carnes 01/16/2016, 7:38 AM

## 2016-01-16 NOTE — Progress Notes (Signed)
I have sent a message to our [place] office's scheduler requesting a follow-up appointment, and our office will call the patient with this information.  Please contact our service at discharge if to help arrange anticoagulation f/u as well. Dayna Dunn PA-C

## 2016-01-18 NOTE — Care Management Note (Signed)
Case Management Note  Patient Details  Name: [name] MRN: [id number] Date of Birth: [birth year]  Subjective/Objective: Received info for f/u from [place] about patient d/c from 01/16/16, & HHRN ordered, but not set up.  Once case reviewed noted d/c order for HHRN-put in @ 2118 01/16/16. [facility] was already following waiting on [place] orders since 01/13/16. Noted [facility] was also recommended. [facility] rep [name] made aware, she will f/u, provided her w/dtr's tel # to contact.                   Action/Plan:f/u on [place] orders   Expected Discharge Date:                  Expected Discharge Plan:  [place]  In-House Referral:  NA  Discharge planning Services  CM Consult  Post Acute Care Choice:  Home Health Choice offered to:  Patient  DME Arranged:  N/A DME Agency:  NA  HH Arranged:  RN, PT [place] Agency:  [place]  Status of Service:  In process, will continue to follow  If discussed at Long Length of Stay Meetings, dates discussed:    Additional Comments:  [name], RN 01/18/2016, 2:33 PM

## 2016-01-19 ENCOUNTER — Telehealth: Payer: Self-pay | Admitting: Interventional Cardiology

## 2016-01-19 NOTE — Telephone Encounter (Signed)
New message       TOC appt on 02-03-15 per Dayna Dunn.

## 2016-01-19 NOTE — Telephone Encounter (Signed)
Patient contacted regarding discharge from [facility] on 01/16/16.  Patient understands to follow up with provider Lyda Jester on 02/03/16 @1 :30 pm at [facility].  Patient does understand her discharge instructions.   Patient does understand her medications and regimen.   Patient does understand to bring all medications to this visit.

## 2016-01-27 ENCOUNTER — Ambulatory Visit (INDEPENDENT_AMBULATORY_CARE_PROVIDER_SITE_OTHER): Payer: Medicare Other | Admitting: Cardiovascular Disease

## 2016-01-27 DIAGNOSIS — Z5181 Encounter for therapeutic drug level monitoring: Secondary | ICD-10-CM

## 2016-01-27 LAB — POCT INR: INR: 2.9

## 2016-02-01 ENCOUNTER — Ambulatory Visit (INDEPENDENT_AMBULATORY_CARE_PROVIDER_SITE_OTHER)
Admission: RE | Admit: 2016-02-01 | Discharge: 2016-02-01 | Disposition: A | Discharge status: Home / Self Care | Payer: Medicare Other | Source: Ambulatory Visit | Attending: Pulmonary Disease | Admitting: Pulmonary Disease

## 2016-02-01 ENCOUNTER — Ambulatory Visit (INDEPENDENT_AMBULATORY_CARE_PROVIDER_SITE_OTHER): Payer: Medicare Other | Admitting: Pulmonary Disease

## 2016-02-01 ENCOUNTER — Encounter: Payer: Self-pay | Admitting: Pulmonary Disease

## 2016-02-01 VITALS — BP 92/52 | HR 57 | Ht 63.0 in | Wt 144.4 lb

## 2016-02-01 DIAGNOSIS — J189 Pneumonia, unspecified organism: Secondary | ICD-10-CM | POA: Diagnosis not present

## 2016-02-01 DIAGNOSIS — R1319 Other dysphagia: Secondary | ICD-10-CM

## 2016-02-01 NOTE — Patient Instructions (Addendum)
We will order a chest x-ray and call you with the results  Use Milta Deiters med saline rinses or bloody pot saline rinses for your sinuses twice a day If you have increasing headache, or or chills with sinus congestion call us and we will call in an antibiotic but I would prefer to avoid this right now  We will arrange for a modified barium swallow test  We will see you back in about 3 weeks with our nurse practitioner and we'll repeat a chest x-ray at that time.

## 2016-02-01 NOTE — Assessment & Plan Note (Signed)
[name] had healthcare associated pneumonia with a right lower lobe infiltrate noted in her December 2017 hospitalization. Associated with this was a small pleural effusion which appeared to be partially loculated. I see where there is no imaging made or thoracentesis made nor was pulmonary consulted for evaluation of this.  See discussion about the pleural effusion below.  In regards to her recurrent episodes of pneumonia in 2017, I think this may be related to immunosuppression for her rheumatoid arthritis, but I'm also concerned about the possibility of dysphagia and recurrent aspiration.  Plan: Repeat chest x-ray now Modified barium swallow to assess for aspiration risk Continue prednisone and methotrexate for now, however if she does not have evidence of aspiration we may need to discuss backing off on immunosuppression with her rheumatologist. Follow-up 3 weeks for repeat chest x-ray

## 2016-02-01 NOTE — Progress Notes (Signed)
Subjective:    Patient ID: [name], female    DOB: Mar 19, 1940, 76 y.o.   MRN: [id number]  Synopsis: First seen by [facility] in May 2017 while hospitalized for a parapneumonic effusion. She been hospitalized for pneumonia one month prior. Multiple thoracenteses were performed on the left sided effusion which showed an exudate. Culture negative, cytology negative. An echocardiogram in 2017 was suggestive of pulmonary hypertension. Since then in late 2017 she developed a large pericardial effusion, she had c diff, HCAP, and had splenic and renal infarcts noted in 12/2015.  These were believed to be due to an infectious source of some sort.  She has had atrial fibrillation in the midst of all this as well.   HPI Chief Complaint  Patient presents with  . Follow-up    Pt is followig up for acute respiratory failure with hypoxia. Pt states she is SOB with activity.    [name] has had a significant amount of acute health problems since the last visit with [place].  See my updated synopsis above referring to this.  She is now taking coumadin.  She says that she feels like hear heart is pounding when she exerts herself.  She has been told that she can't take care of her husband at all right now because of this.    She has some dyspnea when she gets up and does a lot.    She has not noted any bleeding episodes.  She thinks she may have a sinus infection because she feels severe sinus congestion and R ear pain and congestion.  She says that her nose has been running a lot lately as well.    Past Medical History:  Diagnosis Date  . Acne rosacea   . Adhesive capsulitis of left shoulder 1980  . Allergic rhinitis   . Chronic atrial fibrillation ([place])    a. Coumadin anticoagulation - [facility] ([place])  . Chronic bronchitis ([place])   . Esophageal reflux   . Frequent epistaxis    "cause of my coumadin" (12/16/2015)  . Heart murmur   . History of echocardiogram    a. Echo 4/17:   EF 55-60%, no RWMA, mild MR, mild LAE, PASP 48 mmHg, trivial effusion post to heart  . Hypercholesterolemia   . Hyperlipidemia   . Hypertension   . Osteopenia   . Pleural effusion associated with pulmonary infection    Admx 5/17 >> required L thoracentesis (cytology neg for malignancy)   . Pneumonia    "I've had it 4 times this year" (12/16/2015)  . Polymorphic light eruption 2004  . Prolonged QT interval   . Pulmonary HTN   . Respiratory failure ([place]) 04/2015   "spent 3 days; went home; then another 13 days in hospital" (12/16/2015  . Rheumatoid arthritis ([place]) 1975  . Right middle ear infection 1986  . Skin cancer    "burndt it off at the right side of my nose/face" (12/16/2015)      Review of Systems  Constitutional: Negative for chills, fatigue and fever.  HENT: Negative for postnasal drip, rhinorrhea and sore throat.   Respiratory: Negative for choking, shortness of breath and wheezing.   Cardiovascular: Negative for chest pain, palpitations and leg swelling.       Objective:   Physical Exam Vitals:   02/01/16 1156  BP: (!) 92/52  Pulse: (!) 57  SpO2: 96%  Weight: 144 lb 6.4 oz (65.5 kg)  Height: 5\' 3"  (1.6 m)   RA  Gen: well appearing HENT: OP clear, TM's clear, neck supple PULM: Diminished R base, some crackles B, normal percussion CV: RRR, no mgr, trace edema GI: BS+, soft, nontender Derm: no cyanosis or rash Psyche: normal mood and affect  Hospital records from multiple recent hospitalizations reviewed were she was treated for C. difficile and healthcare associated pneumonia with A. fib.  Images from her December 2017 CT scans of the abdomen and chest were reviewed. In late December she had what appeared to be atelectasis versus consolidation and a very small loculated appearing effusion in the right base. This was new compared to the May 2017 studies.    Assessment & Plan:  HCAP (healthcare-associated pneumonia) Madia had healthcare associated  pneumonia with a right lower lobe infiltrate noted in her December 2017 hospitalization. Associated with this was a small pleural effusion which appeared to be partially loculated. I see where there is no imaging made or thoracentesis made nor was pulmonary consulted for evaluation of this.  See discussion about the pleural effusion below.  In regards to her recurrent episodes of pneumonia in 2017, I think this may be related to immunosuppression for her rheumatoid arthritis, but I'm also concerned about the possibility of dysphagia and recurrent aspiration.  Plan: Repeat chest x-ray now Modified barium swallow to assess for aspiration risk Continue prednisone and methotrexate for now, however if she does not have evidence of aspiration we may need to discuss backing off on immunosuppression with her rheumatologist. Follow-up 3 weeks for repeat chest x-ray  Pleural effusion, right In 2017 [facility] had recurrent left-sided pleural effusion which was exudative and felt to be due to a parapneumonic process. However, on her most recent chest images she had a small loculated appearing effusion in the right associated with a diagnosis of healthcare associated pneumonia.  Plan: Repeat chest x-ray If the effusion appears to be increasing in size then we will arrange for thoracentesis    Current Outpatient Prescriptions:  .  acidophilus (RISAQUAD) CAPS capsule, Take 1 capsule by mouth daily., Disp: , Rfl:  .  cholecalciferol (VITAMIN D) 1000 UNITS tablet, Take 2,000 Units by mouth daily., Disp: , Rfl:  .  fluticasone (FLONASE) 50 MCG/ACT nasal spray, Place 2 sprays into both nostrils daily as needed for rhinitis. , Disp: , Rfl:  .  folic acid (FOLVITE) 1 MG tablet, Take 2 mg by mouth daily. , Disp: , Rfl:  .  furosemide (LASIX) 40 MG tablet, Take 40 mg by mouth every evening., Disp: , Rfl:  .  guaiFENesin (MUCINEX) 600 MG 12 hr tablet, Take 600 mg by mouth 2 (two) times daily as needed for cough or  to loosen phlegm. , Disp: , Rfl:  .  methotrexate (RHEUMATREX) 2.5 MG tablet, Take 5-7.5 mg by mouth 2 (two) times a week. Pt takes three tablets every Tuesday and two tablets every Wednesday., Disp: , Rfl: 1 .  metoprolol succinate (TOPROL XL) 25 MG 24 hr tablet, Take 1 tablet (25 mg total) by mouth 2 (two) times daily. To take along with metoprolol XL 100mg  for a total of 125 mg BID, Disp: 60 tablet, Rfl: 1 .  metoprolol succinate (TOPROL-XL) 100 MG 24 hr tablet, Take 1 tablet (100 mg total) by mouth 2 (two) times daily. Take with or immediately following a meal., Disp: 60 tablet, Rfl: 0 .  Multiple Vitamins-Minerals (MULTIVITAMIN WITH MINERALS) tablet, Take 1 tablet by mouth daily., Disp: , Rfl:  .  potassium chloride (K-DUR,KLOR-CON) 10 MEQ tablet, Take 2 tablets (  20 mEq total) by mouth daily., Disp: 60 tablet, Rfl: 1 .  pravastatin (PRAVACHOL) 20 MG tablet, Take 20 mg by mouth at bedtime., Disp: , Rfl:  .  predniSONE (DELTASONE) 2.5 MG tablet, Take 5 mg daily for 1 week; then resume daily dose of 2.5mg  daily, Disp: , Rfl:  .  ranitidine (ZANTAC) 150 MG tablet, Take 1 tablet (150 mg total) by mouth at bedtime., Disp: 30 tablet, Rfl: 0 .  verapamil (CALAN) 120 MG tablet, Take 1 tablet (120 mg total) by mouth every 12 (twelve) hours., Disp: 60 tablet, Rfl: 0 .  warfarin (COUMADIN) 5 MG tablet, Take 1 tablet (5 mg total) by mouth daily., Disp: 90 tablet, Rfl: 3

## 2016-02-01 NOTE — Assessment & Plan Note (Signed)
In 2017 [name] had recurrent left-sided pleural effusion which was exudative and felt to be due to a parapneumonic process. However, on her most recent chest images she had a small loculated appearing effusion in the right associated with a diagnosis of healthcare associated pneumonia.  Plan: Repeat chest x-ray If the effusion appears to be increasing in size then we will arrange for thoracentesis

## 2016-02-03 ENCOUNTER — Ambulatory Visit: Payer: Medicare Other | Admitting: Cardiology

## 2016-02-03 ENCOUNTER — Other Ambulatory Visit (HOSPITAL_COMMUNITY): Payer: Self-pay | Admitting: Pulmonary Disease

## 2016-02-03 DIAGNOSIS — R1319 Other dysphagia: Secondary | ICD-10-CM

## 2016-02-04 ENCOUNTER — Ambulatory Visit (INDEPENDENT_AMBULATORY_CARE_PROVIDER_SITE_OTHER): Payer: Medicare Other | Admitting: Cardiology

## 2016-02-04 DIAGNOSIS — Z5181 Encounter for therapeutic drug level monitoring: Secondary | ICD-10-CM

## 2016-02-04 LAB — POCT INR: INR: 3.3

## 2016-02-08 ENCOUNTER — Inpatient Hospital Stay (HOSPITAL_COMMUNITY)
Admission: EM | Admit: 2016-02-08 | Discharge: 2016-02-11 | DRG: 293 | Disposition: A | Discharge status: Home / Self Care | Payer: Medicare Other | Attending: Family Medicine | Admitting: Family Medicine

## 2016-02-08 ENCOUNTER — Telehealth: Payer: Self-pay | Admitting: Emergency Medicine

## 2016-02-08 ENCOUNTER — Emergency Department (HOSPITAL_COMMUNITY): Payer: Medicare Other

## 2016-02-08 ENCOUNTER — Encounter (HOSPITAL_COMMUNITY): Payer: Self-pay | Admitting: Emergency Medicine

## 2016-02-08 DIAGNOSIS — I272 Pulmonary hypertension, unspecified: Secondary | ICD-10-CM | POA: Diagnosis present

## 2016-02-08 DIAGNOSIS — Z90710 Acquired absence of both cervix and uterus: Secondary | ICD-10-CM

## 2016-02-08 DIAGNOSIS — Z7951 Long term (current) use of inhaled steroids: Secondary | ICD-10-CM

## 2016-02-08 DIAGNOSIS — Z9889 Other specified postprocedural states: Secondary | ICD-10-CM

## 2016-02-08 DIAGNOSIS — Z8249 Family history of ischemic heart disease and other diseases of the circulatory system: Secondary | ICD-10-CM

## 2016-02-08 DIAGNOSIS — I482 Chronic atrial fibrillation, unspecified: Secondary | ICD-10-CM | POA: Diagnosis present

## 2016-02-08 DIAGNOSIS — Z85828 Personal history of other malignant neoplasm of skin: Secondary | ICD-10-CM

## 2016-02-08 DIAGNOSIS — Z888 Allergy status to other drugs, medicaments and biological substances status: Secondary | ICD-10-CM

## 2016-02-08 DIAGNOSIS — I5033 Acute on chronic diastolic (congestive) heart failure: Secondary | ICD-10-CM

## 2016-02-08 DIAGNOSIS — D509 Iron deficiency anemia, unspecified: Secondary | ICD-10-CM | POA: Diagnosis present

## 2016-02-08 DIAGNOSIS — M069 Rheumatoid arthritis, unspecified: Secondary | ICD-10-CM | POA: Diagnosis present

## 2016-02-08 DIAGNOSIS — Z880 Allergy status to penicillin: Secondary | ICD-10-CM

## 2016-02-08 DIAGNOSIS — M8580 Other specified disorders of bone density and structure, unspecified site: Secondary | ICD-10-CM | POA: Diagnosis present

## 2016-02-08 DIAGNOSIS — Z7901 Long term (current) use of anticoagulants: Secondary | ICD-10-CM

## 2016-02-08 DIAGNOSIS — J309 Allergic rhinitis, unspecified: Secondary | ICD-10-CM | POA: Diagnosis present

## 2016-02-08 DIAGNOSIS — D649 Anemia, unspecified: Secondary | ICD-10-CM | POA: Diagnosis present

## 2016-02-08 DIAGNOSIS — K219 Gastro-esophageal reflux disease without esophagitis: Secondary | ICD-10-CM | POA: Diagnosis present

## 2016-02-08 DIAGNOSIS — E785 Hyperlipidemia, unspecified: Secondary | ICD-10-CM | POA: Diagnosis present

## 2016-02-08 DIAGNOSIS — I10 Essential (primary) hypertension: Secondary | ICD-10-CM | POA: Diagnosis present

## 2016-02-08 DIAGNOSIS — Z885 Allergy status to narcotic agent status: Secondary | ICD-10-CM

## 2016-02-08 DIAGNOSIS — Z79899 Other long term (current) drug therapy: Secondary | ICD-10-CM

## 2016-02-08 DIAGNOSIS — I110 Hypertensive heart disease with heart failure: Secondary | ICD-10-CM | POA: Diagnosis not present

## 2016-02-08 LAB — BLOOD GAS, VENOUS
Acid-Base Excess: 1.1 mmol/L (ref 0.0–2.0)
Bicarbonate: 25 mmol/L (ref 20.0–28.0)
FIO2: 0.21
O2 Saturation: 42.1 %
Patient temperature: 98.6
pCO2, Ven: 39.2 mmHg — ABNORMAL LOW (ref 44.0–60.0)
pH, Ven: 7.421 (ref 7.250–7.430)

## 2016-02-08 LAB — CBC WITH DIFFERENTIAL/PLATELET
Basophils Absolute: 0 10*3/uL (ref 0.0–0.1)
Basophils Relative: 0 %
Eosinophils Absolute: 0 10*3/uL (ref 0.0–0.7)
Eosinophils Relative: 0 %
HCT: 34.8 % — ABNORMAL LOW (ref 36.0–46.0)
Hemoglobin: 11.1 g/dL — ABNORMAL LOW (ref 12.0–15.0)
Lymphocytes Relative: 11 %
Lymphs Abs: 1 10*3/uL (ref 0.7–4.0)
MCH: 28.8 pg (ref 26.0–34.0)
MCHC: 31.9 g/dL (ref 30.0–36.0)
MCV: 90.4 fL (ref 78.0–100.0)
Monocytes Absolute: 1.3 10*3/uL — ABNORMAL HIGH (ref 0.1–1.0)
Monocytes Relative: 13 %
Neutro Abs: 7.1 10*3/uL (ref 1.7–7.7)
Neutrophils Relative %: 76 %
Platelets: 197 10*3/uL (ref 150–400)
RBC: 3.85 MIL/uL — ABNORMAL LOW (ref 3.87–5.11)
RDW: 17.6 % — ABNORMAL HIGH (ref 11.5–15.5)
WBC: 9.4 10*3/uL (ref 4.0–10.5)

## 2016-02-08 LAB — BASIC METABOLIC PANEL
Anion gap: 8 (ref 5–15)
BUN: 13 mg/dL (ref 6–20)
CALCIUM: 8.8 mg/dL — AB (ref 8.9–10.3)
CHLORIDE: 100 mmol/L — AB (ref 101–111)
CO2: 24 mmol/L (ref 22–32)
Creatinine, Ser: 0.71 mg/dL (ref 0.44–1.00)
GFR calc Af Amer: >60 mL/min (ref >60)
Glucose, Bld: 99 mg/dL (ref 65–99)
Potassium: 4 mmol/L (ref 3.5–5.1)
SODIUM: 132 mmol/L — AB (ref 135–145)

## 2016-02-08 LAB — I-STAT TROPONIN, ED: Troponin i, poc: 0 ng/mL (ref 0.00–0.08)

## 2016-02-08 LAB — PHOSPHORUS: Phosphorus: 3.1 mg/dL (ref 2.5–4.6)

## 2016-02-08 LAB — MAGNESIUM: MAGNESIUM: 2 mg/dL (ref 1.7–2.4)

## 2016-02-08 LAB — INFLUENZA PANEL BY PCR (TYPE A & B)
Influenza A By PCR: NEGATIVE
Influenza B By PCR: NEGATIVE

## 2016-02-08 LAB — BRAIN NATRIURETIC PEPTIDE: B Natriuretic Peptide: 415.9 pg/mL — ABNORMAL HIGH (ref 0.0–100.0)

## 2016-02-08 MED ORDER — PRAVASTATIN SODIUM 20 MG PO TABS
20.0000 mg | ORAL_TABLET | Freq: Every day | ORAL | Status: DC
  Administered 2016-02-09 – 2016-02-10 (×3): 20 mg via ORAL
  Filled 2016-02-08 (×3): qty 1

## 2016-02-08 MED ORDER — FLUTICASONE PROPIONATE 50 MCG/ACT NA SUSP
2.0000 | Freq: Every day | NASAL | Status: DC | PRN
  Filled 2016-02-08: qty 16

## 2016-02-08 MED ORDER — RISAQUAD PO CAPS
1.0000 | ORAL_CAPSULE | Freq: Every day | ORAL | Status: DC
  Administered 2016-02-09 – 2016-02-11 (×3): 1 via ORAL
  Filled 2016-02-08 (×3): qty 1

## 2016-02-08 MED ORDER — POTASSIUM CHLORIDE CRYS ER 20 MEQ PO TBCR
20.0000 meq | EXTENDED_RELEASE_TABLET | Freq: Every day | ORAL | Status: DC
  Administered 2016-02-09 – 2016-02-11 (×3): 20 meq via ORAL
  Filled 2016-02-08 (×3): qty 1

## 2016-02-08 MED ORDER — ALBUTEROL SULFATE (2.5 MG/3ML) 0.083% IN NEBU: 5.0000 mg | INHALATION_SOLUTION | Freq: Once | RESPIRATORY_TRACT | Status: DC

## 2016-02-08 MED ORDER — ACETAMINOPHEN 500 MG PO TABS
1000.0000 mg | ORAL_TABLET | Freq: Once | ORAL | Status: AC
  Administered 2016-02-08: 1000 mg via ORAL
  Filled 2016-02-08: qty 2

## 2016-02-08 MED ORDER — PREDNISONE 5 MG PO TABS
2.5000 mg | ORAL_TABLET | Freq: Every day | ORAL | Status: DC
  Administered 2016-02-09 – 2016-02-11 (×3): 2.5 mg via ORAL
  Filled 2016-02-08 (×3): qty 1

## 2016-02-08 MED ORDER — FUROSEMIDE 40 MG PO TABS: 40.0000 mg | ORAL_TABLET | Freq: Every evening | ORAL | Status: DC

## 2016-02-08 MED ORDER — GUAIFENESIN ER 600 MG PO TB12: 600.0000 mg | ORAL_TABLET | Freq: Two times a day (BID) | ORAL | Status: DC | PRN

## 2016-02-08 MED ORDER — SODIUM CHLORIDE 0.9% FLUSH
3.0000 mL | Freq: Two times a day (BID) | INTRAVENOUS | Status: DC
  Administered 2016-02-08 – 2016-02-11 (×6): 3 mL via INTRAVENOUS

## 2016-02-08 MED ORDER — WARFARIN - PHARMACIST DOSING INPATIENT
Freq: Every day | Status: DC
  Administered 2016-02-11: 18:00:00

## 2016-02-08 MED ORDER — MAGNESIUM SULFATE 2 GM/50ML IV SOLN
2.0000 g | Freq: Once | INTRAVENOUS | Status: AC
  Administered 2016-02-09: 2 g via INTRAVENOUS
  Filled 2016-02-08: qty 50

## 2016-02-08 MED ORDER — ENALAPRIL MALEATE 2.5 MG PO TABS
2.5000 mg | ORAL_TABLET | Freq: Every day | ORAL | Status: DC
  Administered 2016-02-09 – 2016-02-11 (×3): 2.5 mg via ORAL
  Filled 2016-02-08 (×3): qty 1

## 2016-02-08 MED ORDER — LEVALBUTEROL HCL 1.25 MG/0.5ML IN NEBU
1.2500 mg | INHALATION_SOLUTION | Freq: Four times a day (QID) | RESPIRATORY_TRACT | Status: DC | PRN
  Filled 2016-02-08: qty 0.5

## 2016-02-08 MED ORDER — IPRATROPIUM-ALBUTEROL 0.5-2.5 (3) MG/3ML IN SOLN
3.0000 mL | Freq: Once | RESPIRATORY_TRACT | Status: AC
  Administered 2016-02-08: 3 mL via RESPIRATORY_TRACT
  Filled 2016-02-08: qty 3

## 2016-02-08 MED ORDER — FAMOTIDINE 20 MG PO TABS: 20.0000 mg | ORAL_TABLET | Freq: Two times a day (BID) | ORAL | Status: DC

## 2016-02-08 MED ORDER — FUROSEMIDE 10 MG/ML IJ SOLN
40.0000 mg | Freq: Once | INTRAMUSCULAR | Status: AC
  Administered 2016-02-08: 40 mg via INTRAVENOUS
  Filled 2016-02-08: qty 4

## 2016-02-08 MED ORDER — FUROSEMIDE 10 MG/ML IJ SOLN
40.0000 mg | Freq: Two times a day (BID) | INTRAMUSCULAR | Status: DC
  Administered 2016-02-09 – 2016-02-10 (×3): 40 mg via INTRAVENOUS
  Filled 2016-02-08 (×3): qty 4

## 2016-02-08 NOTE — ED Triage Notes (Signed)
Pt to ED by home nurse suggestion due to low O2 sat and shortness of breath . Recent Dx fluids in lungs . A and O x 4, no obvious resp distress.

## 2016-02-08 NOTE — H&P (Signed)
History and Physical    [name] [id number] DOB: [birth year] DOA: 02/08/2016  PCP: Leeroy Cha, MD   Patient coming from: Home.  Chief Complaint: Shortness of breath.  HPI: [name] is a 76 y.o. female with medical history significant of rosacea, allergic rhinitis, chronic atrial fibrillation on warfarin, chronic bronchitis, GERD, frequent epistaxis due to warfarin use, hyperlipidemia, hypertension, osteopenia, multiple episodes of pneumonia, prolonged QT interval history, pulmonary hypertension rheumatoid arthritis was coming to the emergency department due to progressively worse shortness of breath since yesterday.  Per patient and her daughter, the patient has been feeling dyspneic since yesterday when her physical therapist noticed that she was not performing too well. Her therapist eventually notified her home health nurse who evaluated her today and found her to have decreased breath sounds, rales and decreased O2 saturation. She was trying to get the patient's PCP to evaluate her today, but eventually decided to refer her to the emergency department. Associated symptoms are palpitations, orthopnea, nonproductive cough and mild wheezing. She denies chest pain, dizziness, diaphoresis, pitting edema of the lower extremities. She denies abdominal pain, nausea, emesis, diarrhea, constipation, melena or hematochezia. She denies GU symptoms.   ED Course: The patient received supplemental oxygen, DuoNeb and 40 mg of IV furosemide in the emergency department, which she states has helped. Her workup shows that he see a 9.4, hemoglobin 11.1, platelets 197. Her sodium was 132, potassium 4.0, chloride 100 and bicarbonate 24 minimal/ML. BUN was 13, creatinine 0.71 and glucose 99 mg/dL. ABG was normal with the exception of mild decrease PCO2 at 39. BNP was 415.9, Her EKG sinus rhythm with multiple APCs prolonged PR and prolonged QT. Her chest radiographs show cardiomegaly with  increased vascular congestion and new small bilateral pleural effusions.   Review of Systems: As per HPI otherwise 10 point review of systems negative.    Past Medical History:  Diagnosis Date  . Acne rosacea   . Adhesive capsulitis of left shoulder 1980  . Allergic rhinitis   . Chronic atrial fibrillation ([place])    a. Coumadin anticoagulation - [facility] ([place])  . Chronic bronchitis ([place])   . Esophageal reflux   . Frequent epistaxis    "cause of my coumadin" (12/16/2015)  . Heart murmur   . History of echocardiogram    a. Echo 4/17:  EF 55-60%, no RWMA, mild MR, mild LAE, PASP 48 mmHg, trivial effusion post to heart  . Hypercholesterolemia   . Hyperlipidemia   . Hypertension   . Osteopenia   . Pleural effusion associated with pulmonary infection    Admx 5/17 >> required L thoracentesis (cytology neg for malignancy)   . Pneumonia    "I've had it 4 times this year" (12/16/2015)  . Polymorphic light eruption 2004  . Prolonged QT interval   . Pulmonary HTN   . Respiratory failure ([place]) 04/2015   "spent 3 days; went home; then another 13 days in hospital" (12/16/2015  . Rheumatoid arthritis ([place]) 1975  . Right middle ear infection 1986  . Skin cancer    "burndt it off at the right side of my nose/face" (12/16/2015)    Past Surgical History:  Procedure Laterality Date  . BREAST BIOPSY Left 2000s X 2  . CARDIAC CATHETERIZATION  03/2010   a. [place] 2/09: no ischemia; low risk  //  b. [place] 3/12: normal coronary arteries  . CARDIAC CATHETERIZATION N/A 12/16/2015   Procedure: Pericardiocentesis;  Surgeon: Nelva Bush, MD;  Location: [facility];  Service: Cardiovascular;  Laterality: N/A;  . CARDIOVERSION  04/2010  . TEMPOROMANDIBULAR JOINT SURGERY Bilateral   . TOTAL ABDOMINAL HYSTERECTOMY       reports that she has never smoked. She has never used smokeless tobacco. She reports that she does not drink alcohol or use drugs.  Allergies  Allergen  Reactions  . Arthrotec [Diclofenac-Misoprostol] Diarrhea  . Erythromycin Anaphylaxis       . Morphine Sulfate Other (See Comments)    Reaction:  Hallucinations   . Amoxicillin Rash and Other (See Comments)    Has patient had a PCN reaction causing immediate rash, facial/tongue/throat swelling, SOB or lightheadedness with hypotension: Yes Has patient had a PCN reaction causing severe rash involving mucus membranes or skin necrosis: Yes Has patient had a PCN reaction that required hospitalization No Has patient had a PCN reaction occurring within the last 10 years: No If all of the above answers are "NO", then may proceed with Cephalosporin use.  . Diltiazem Hcl Swelling, Rash and Other (See Comments)    Pt is able to tolerate Verapamil.    . Gold-Containing Drug Products Rash  . Macrodantin [Nitrofurantoin Macrocrystal] Rash  . Naproxen Rash  . Penicillins Rash and Other (See Comments)    ++ tolerates cefepime and ceftriaxone ++ Has patient had a PCN reaction causing immediate rash, facial/tongue/throat swelling, SOB or lightheadedness with hypotension: Yes Has patient had a PCN reaction causing severe rash involving mucus membranes or skin necrosis: Yes Has patient had a PCN reaction that required hospitalization No Has patient had a PCN reaction occurring within the last 10 years: No If all of the above answers are "NO", then may proceed with Cephalosporin use.     Family History  Problem Relation Age of Onset  . Heart disease Father   . Heart disease Mother   . Other Mother     prolonged qtc  . Heart disease Sister   . Heart disease Sister   . Healthy Sister   . Fainting Neg Hx     Prior to Admission medications   Medication Sig Start Date End Date Taking? Authorizing Provider  acidophilus (RISAQUAD) CAPS capsule Take 1 capsule by mouth daily.   Yes Historical Provider, MD  cholecalciferol (VITAMIN D) 1000 UNITS tablet Take 2,000 Units by mouth daily.   Yes Historical  Provider, MD  fluticasone (FLONASE) 50 MCG/ACT nasal spray Place 2 sprays into both nostrils daily as needed for rhinitis.    Yes Historical Provider, MD  folic acid (FOLVITE) 1 MG tablet Take 2 mg by mouth daily.    Yes Historical Provider, MD  furosemide (LASIX) 40 MG tablet Take 40 mg by mouth every evening.   Yes Historical Provider, MD  guaiFENesin (MUCINEX) 600 MG 12 hr tablet Take 600 mg by mouth 2 (two) times daily as needed for cough or to loosen phlegm.    Yes Historical Provider, MD  methotrexate (RHEUMATREX) 2.5 MG tablet Take 5-7.5 mg by mouth 2 (two) times a week. Pt takes three tablets every Tuesday and two tablets every Wednesday.   Yes Historical Provider, MD  metoprolol succinate (TOPROL XL) 25 MG 24 hr tablet Take 1 tablet (25 mg total) by mouth 2 (two) times daily. To take along with metoprolol XL 100mg  for a total of 125 mg BID 01/16/16  Yes Barton Dubois, MD  metoprolol succinate (TOPROL-XL) 100 MG 24 hr tablet Take 1 tablet (100 mg total) by mouth 2 (two) times daily. Take with or  immediately following a meal. 01/02/16  Yes Florencia Reasons, MD  Multiple Vitamins-Minerals (MULTIVITAMIN WITH MINERALS) tablet Take 1 tablet by mouth daily.   Yes Historical Provider, MD  potassium chloride (K-DUR,KLOR-CON) 10 MEQ tablet Take 2 tablets (20 mEq total) by mouth daily. 01/16/16  Yes Barton Dubois, MD  pravastatin (PRAVACHOL) 20 MG tablet Take 20 mg by mouth at bedtime.   Yes Historical Provider, MD  predniSONE (DELTASONE) 2.5 MG tablet Take 5 mg daily for 1 week; then resume daily dose of 2.5mg  daily 01/16/16  Yes Barton Dubois, MD  ranitidine (ZANTAC) 150 MG tablet Take 1 tablet (150 mg total) by mouth at bedtime. 12/31/15  Yes Florencia Reasons, MD  verapamil (CALAN) 120 MG tablet Take 1 tablet (120 mg total) by mouth every 12 (twelve) hours. 01/02/16  Yes Florencia Reasons, MD  warfarin (COUMADIN) 5 MG tablet Take 1 tablet (5 mg total) by mouth daily. 01/04/16  Yes Jettie Booze, MD    Physical  Exam:  Constitutional: NAD, calm, comfortable Vitals:   02/08/16 1801 02/08/16 1917 02/08/16 2023 02/08/16 2030  BP: 115/84  118/68 114/66  Pulse: 67  106 (!) 47  Resp: (!) 29  25 (!) 31  Temp: 100.4 F (38 C)  100.3 F (37.9 C)   TempSrc: Oral  Oral   SpO2: 97% 96% 94% 94%  Weight:   62.6 kg (138 lb)   Height:   5\' 3"  (1.6 m)    Eyes: PERRL, lids and conjunctivae normal ENMT: Mucous membranes are moist. Posterior pharynx clear of any exudate or lesions. Neck: normal, supple, no masses, no thyromegaly Respiratory: bibasilar rales and decreased breath sounds, mild rhonchi bilaterally. Normal respiratory effort. No accessory muscle use.  Cardiovascular: Irregularly irregular, no murmurs / rubs / gallops. No extremity edema. 2+ pedal pulses. No carotid bruits.  Abdomen: Soft, no tenderness, no masses palpated. No hepatosplenomegaly. Bowel sounds positive.  Musculoskeletal: no clubbing / cyanosis.Good ROM, no contractures. Normal muscle tone.  Skin: no rashes, lesions, ulcers. No induration Neurologic: CN 2-12 grossly intact. Sensation intact, DTR normal. Strength 5/5 in all 4.  Psychiatric: Normal judgment and insight. Alert and oriented x 3.    Labs on Admission: I have personally reviewed following labs and imaging studies  CBC:  Recent Labs Lab 02/08/16 1950  WBC 9.4  NEUTROABS 7.1  HGB 11.1*  HCT 34.8*  MCV 90.4  PLT [id number]   Basic Metabolic Panel:  Recent Labs Lab 02/08/16 1950  NA 132*  K 4.0  CL 100*  CO2 24  GLUCOSE 99  BUN 13  CREATININE 0.71  CALCIUM 8.8*   GFR: Estimated Creatinine Clearance: 51 mL/min (by C-G formula based on SCr of 0.71 mg/dL). Liver Function Tests: No results for input(s): AST, ALT, ALKPHOS, BILITOT, PROT, ALBUMIN in the last 168 hours. No results for input(s): LIPASE, AMYLASE in the last 168 hours. No results for input(s): AMMONIA in the last 168 hours. Coagulation Profile:  Recent Labs Lab 02/04/16  INR 3.3   Cardiac  Enzymes: No results for input(s): CKTOTAL, CKMB, CKMBINDEX, TROPONINI in the last 168 hours. BNP (last 3 results)  Recent Labs  12/13/15 1704  PROBNP 215.0*   HbA1C: No results for input(s): HGBA1C in the last 72 hours. CBG: No results for input(s): GLUCAP in the last 168 hours. Lipid Profile: No results for input(s): CHOL, HDL, LDLCALC, TRIG, CHOLHDL, LDLDIRECT in the last 72 hours. Thyroid Function Tests: No results for input(s): TSH, T4TOTAL, FREET4, T3FREE, THYROIDAB in the last  72 hours. Anemia Panel: No results for input(s): VITAMINB12, FOLATE, FERRITIN, TIBC, IRON, RETICCTPCT in the last 72 hours. Urine analysis:    Component Value Date/Time   COLORURINE AMBER (A) 01/08/2016 1507   APPEARANCEUR TURBID (A) 01/08/2016 1507   LABSPEC 1.014 01/08/2016 1507   PHURINE 8.0 01/08/2016 1507   GLUCOSEU NEGATIVE 01/08/2016 1507   HGBUR NEGATIVE 01/08/2016 1507   BILIRUBINUR NEGATIVE 01/08/2016 1507   KETONESUR NEGATIVE 01/08/2016 1507   PROTEINUR 100 (A) 01/08/2016 1507   UROBILINOGEN 0.2 10/29/2013 1932   NITRITE NEGATIVE 01/08/2016 1507   LEUKOCYTESUR NEGATIVE 01/08/2016 1507    Radiological Exams on Admission: Dg Chest 2 View  Result Date: 02/08/2016 CLINICAL DATA:  Worsening shortness of breath for several months. Hypoxia. EXAM: CHEST  2 VIEW COMPARISON:  02/01/2016 FINDINGS: Stable cardiomegaly. Increased pulmonary vascular congestion. No evidence of pulmonary consolidation. Decreased lung volumes seen with new small bilateral pleural effusions and bibasilar atelectasis. IMPRESSION: Cardiomegaly with increased pulmonary vascular congestion. New small bilateral pleural effusions and bibasilar atelectasis. No evidence of pulmonary consolidation. Electronically Signed   By: Earle Gell M.D.   On: 02/08/2016 19:08   01/09/2016 echocardiogram ------------------------------------------------------------------- LV EF: 50% -    55%  ------------------------------------------------------------------- Indications:      Atrial fibrillation - 427.31.  ------------------------------------------------------------------- History:   PMH:   Chest pain.  Fever.  PMH:  Recent Pericardialcentesis.  Risk factors:  Hypertension. Dyslipidemia.   ------------------------------------------------------------------- Study Conclusions  - Left ventricle: The cavity size was normal. There was mild   concentric hypertrophy. Systolic function was normal. The   estimated ejection fraction was in the range of 50% to 55%. Wall   motion was normal; there were no regional wall motion   abnormalities. The study was not technically sufficient to allow   evaluation of LV diastolic dysfunction due to atrial   fibrillation. - Aortic valve: Trileaflet; normal thickness leaflets. There was no   regurgitation. - Aortic root: The aortic root was normal in size. - Ascending aorta: The ascending aorta was normal in size. - Mitral valve: Structurally normal valve. There was mild   regurgitation. - Left atrium: The atrium was mildly dilated. - Right ventricle: The cavity size was normal. Wall thickness was   normal. Systolic function was normal. - Right atrium: The atrium was normal in size. - Tricuspid valve: There was mild regurgitation. - Pulmonary arteries: Systolic pressure was mildly increased. PA   peak pressure: 40 mm Hg (S). - Inferior vena cava: The vessel was normal in size. - Pericardium, extracardiac: There was no pericardial effusion  EKG: Independently reviewed Vent. rate 69 BPM PR interval * ms QRS duration 75 ms QT/QTc 638/600 ms P-R-T axes 0 -31 -59 Sinus rhythm Atrial premature complexes in couplets Prolonged PR interval Left axis deviation Low voltage, precordial leads RSR' in V1 or V2, right VCD or RVH Prolonged QT interval  Assessment/Plan Principal Problem:   Acute on chronic diastolic CHF (congestive  heart failure) (HCC) Admit to telemetry /observation. Continue supplemental oxygen. Continue bronchodilators as needed. Continue furosemide 40 mg IVP every twice a day . Monitor electrolytes and renal function. Monitor intake and output.  Active Problems:   Hypertension Hold beta blocker and verapamil. Monitor blood pressure. Resume antihypertensives in the morning once acute decompensation improved.    Hyperlipidemia Continue pravastatin. Monitor LFTs and fasting lipids periodically.    Rheumatoid arthritis ([place]) Stable. Hold methotrexate for now. Monitor LFTs periodically.    Chronic atrial fibrillation (HCC) CHA2DS2-VASc Score of at least  4. Resume beta blocker and calcium channel blocker in a.m. once acute CHF decompensation better Continue warfarin per pharmacy. Monitor electrolytes.    GERD (gastroesophageal reflux disease) Pepcid 20 mg by mouth twice a day.    Anemia Monitor H&H.    DVT prophylaxis: On warfarin. Code Status: Full code. Family Communication: her daughter was present in the room. Disposition Plan: Admit for 24 hours to induce diuresis. Consults called:  Admission status: Observation/telemetry.   Reubin Milan MD [facility] Pager [phone].  If 7PM-7AM, please contact night-coverage [email or web address] Password [facility]  02/08/2016, 9:36 PM

## 2016-02-08 NOTE — Telephone Encounter (Signed)
It sounds as if she needs to go to ER . With that RR and shallow breathing , may need EMS  She has been in hospital 3 times in last 6 weeks .  I am concerned for her to wait till tomorrow  She may have the flu or another PNA Please contact office for sooner follow up if symptoms do not improve or worsen or seek emergency care

## 2016-02-08 NOTE — Telephone Encounter (Signed)
Spoke with [place] Anderson [place], states pt has a fever of 100.4, diminished lung sounds, wheezing, shortness of breath, respirations of 32, shallow breathing.  Pt has been this way since yesterday.  Pt denies chest pain, mucus production. Pt has been taking tylenol to help with fever, has not been using any rescue inhalers (per Anderson Malta, pt has 3 rescue inhalers at home that expired in 2009, 2012, and 2016).   Pt has OV with RB tomorrow, but is requesting recs to get through tomorrow- Anderson Malta is concerned that she may need to be hospitalized if nothing is done before tomorrow.  [facility] on [address] to DOD as B[facility] is unavailable today.  TP please advise.  Thanks.

## 2016-02-08 NOTE — ED Provider Notes (Signed)
[facility] DEPT Provider Note   CSN: [id number] Arrival date & time: 02/08/16  1742     History   Chief Complaint Chief Complaint  Patient presents with  . Shortness of Breath  . Back Pain    HPI [name] is a 76 y.o. female.  HPI 76 yo F with PMHx as below who p/w acute onset SOB. Pt has recent h/o hospitalization multiple times for CAP, diarrheal illness and sepsis. She finally returned home several days ago and had been doing well, but over past 12-24 hours has developed severe SOB. Pt states that her sx began gradually as sensation of difficulty "catching" her breath, but have rapidly progressed over the past several hours. She endorses chest pressure, SOB with severe DOE. Denies LE swelling. Denies chills but was febrile on arrival here. She does endorse cough but denies sputum production. H/o previous "fluid on lungs' requiring thoracentesis.  Past Medical History:  Diagnosis Date  . Acne rosacea   . Adhesive capsulitis of left shoulder 1980  . Allergic rhinitis   . Chronic atrial fibrillation ([place])    a. Coumadin anticoagulation - [facility] ([place])  . Chronic bronchitis ([place])   . Esophageal reflux   . Frequent epistaxis    "cause of my coumadin" (12/16/2015)  . Heart murmur   . History of echocardiogram    a. Echo 4/17:  EF 55-60%, no RWMA, mild MR, mild LAE, PASP 48 mmHg, trivial effusion post to heart  . Hypercholesterolemia   . Hyperlipidemia   . Hypertension   . Osteopenia   . Pleural effusion associated with pulmonary infection    Admx 5/17 >> required L thoracentesis (cytology neg for malignancy)   . Pneumonia    "I've had it 4 times this year" (12/16/2015)  . Polymorphic light eruption 2004  . Prolonged QT interval   . Pulmonary HTN   . Respiratory failure ([place]) 04/2015   "spent 3 days; went home; then another 13 days in hospital" (12/16/2015  . Rheumatoid arthritis ([place]) 1975  . Right middle ear infection 1986  . Skin cancer    "burndt it off at the right side of my nose/face" (12/16/2015)    Patient Active Problem List   Diagnosis Date Noted  . Anemia 02/08/2016  . 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 Malnutrition of moderate degree 12/30/2015  . Immunosuppressed status ([place])   . Diarrhea 12/28/2015  . Fall 12/28/2015  . Acute on chronic diastolic CHF (congestive heart failure) ([place]) 12/28/2015  . Sepsis ([place]) 12/27/2015  . Pericardial effusion 12/15/2015  . GERD (gastroesophageal reflux disease) 12/15/2015  . Pulmonary hypertension 06/08/2015  . Chronic atrial fibrillation ([place])   . Long term (current) use of anticoagulants   . Chest tube in place   . Exudative pleural effusion   . HCAP (healthcare-associated pneumonia) 05/23/2015  . Acute respiratory failure with hypoxia ([place]) 05/23/2015  . Pleural effusion, right 05/23/2015  . Pleuritic chest pain 05/23/2015  . Acute respiratory failure ([place]) 05/23/2015  . Hypokalemia 04/24/2015  . Hypomagnesemia 04/24/2015  . QT prolongation 04/24/2015  . Rheumatoid arthritis ([place]) 04/24/2015  . CAP (community acquired pneumonia) 08/03/2014  . Encounter for therapeutic drug monitoring 07/14/2013  . Hypertension 05/15/2013  . Hyperlipidemia 05/15/2013  . Ankle edema 05/15/2013  . Long term current use of anticoagulant therapy 11/07/2012    Past Surgical History:  Procedure Laterality Date  . BREAST BIOPSY Left 2000s X 2  . CARDIAC CATHETERIZATION  03/2010  a. Myoview 2/09: no ischemia; low risk  //  b. Jack 3/12: normal coronary arteries  . CARDIAC CATHETERIZATION N/A 12/16/2015   Procedure: Pericardiocentesis;  Surgeon: Nelva Bush, MD;  Location: [facility];  Service: Cardiovascular;  Laterality: N/A;  . CARDIOVERSION  04/2010  . TEMPOROMANDIBULAR JOINT SURGERY Bilateral   . TOTAL ABDOMINAL HYSTERECTOMY      OB History    No data available       Home Medications    Prior to Admission medications     Medication Sig Start Date End Date Taking? Authorizing Provider  acidophilus (RISAQUAD) CAPS capsule Take 1 capsule by mouth daily.   Yes Historical Provider, MD  cholecalciferol (VITAMIN D) 1000 UNITS tablet Take 2,000 Units by mouth daily.   Yes Historical Provider, MD  fluticasone (FLONASE) 50 MCG/ACT nasal spray Place 2 sprays into both nostrils daily as needed for rhinitis.    Yes Historical Provider, MD  folic acid (FOLVITE) 1 MG tablet Take 2 mg by mouth daily.    Yes Historical Provider, MD  furosemide (LASIX) 40 MG tablet Take 40 mg by mouth every evening.   Yes Historical Provider, MD  guaiFENesin (MUCINEX) 600 MG 12 hr tablet Take 600 mg by mouth 2 (two) times daily as needed for cough or to loosen phlegm.    Yes Historical Provider, MD  methotrexate (RHEUMATREX) 2.5 MG tablet Take 5-7.5 mg by mouth 2 (two) times a week. Pt takes three tablets every Tuesday and two tablets every Wednesday.   Yes Historical Provider, MD  metoprolol succinate (TOPROL XL) 25 MG 24 hr tablet Take 1 tablet (25 mg total) by mouth 2 (two) times daily. To take along with metoprolol XL 100mg  for a total of 125 mg BID 01/16/16  Yes Barton Dubois, MD  metoprolol succinate (TOPROL-XL) 100 MG 24 hr tablet Take 1 tablet (100 mg total) by mouth 2 (two) times daily. Take with or immediately following a meal. 01/02/16  Yes Florencia Reasons, MD  Multiple Vitamins-Minerals (MULTIVITAMIN WITH MINERALS) tablet Take 1 tablet by mouth daily.   Yes Historical Provider, MD  potassium chloride (K-DUR,KLOR-CON) 10 MEQ tablet Take 2 tablets (20 mEq total) by mouth daily. 01/16/16  Yes Barton Dubois, MD  pravastatin (PRAVACHOL) 20 MG tablet Take 20 mg by mouth at bedtime.   Yes Historical Provider, MD  predniSONE (DELTASONE) 2.5 MG tablet Take 5 mg daily for 1 week; then resume daily dose of 2.5mg  daily 01/16/16  Yes Barton Dubois, MD  ranitidine (ZANTAC) 150 MG tablet Take 1 tablet (150 mg total) by mouth at bedtime. 12/31/15  Yes Florencia Reasons, MD   verapamil (CALAN) 120 MG tablet Take 1 tablet (120 mg total) by mouth every 12 (twelve) hours. 01/02/16  Yes Florencia Reasons, MD  warfarin (COUMADIN) 5 MG tablet Take 1 tablet (5 mg total) by mouth daily. 01/04/16  Yes Jettie Booze, MD    Family History Family History  Problem Relation Age of Onset  . Heart disease Father   . Heart disease Mother   . Other Mother     prolonged qtc  . Heart disease Sister   . Heart disease Sister   . Healthy Sister   . Fainting Neg Hx     Social History Social History  Substance Use Topics  . Smoking status: Never Smoker  . Smokeless tobacco: Never Used  . Alcohol use No     Allergies   Arthrotec [diclofenac-misoprostol]; Erythromycin; Morphine sulfate; Amoxicillin; Diltiazem hcl; Gold-containing drug  products; Macrodantin [nitrofurantoin macrocrystal]; Naproxen; and Penicillins   Review of Systems Review of Systems  Constitutional: Positive for chills, fatigue and unexpected weight change (2 lb weight gain overnight). Negative for fever.  HENT: Negative for congestion, rhinorrhea and sore throat.   Eyes: Negative for visual disturbance.  Respiratory: Positive for cough and shortness of breath. Negative for wheezing.   Cardiovascular: Negative for chest pain and leg swelling.  Gastrointestinal: Negative for abdominal pain, diarrhea, nausea and vomiting.  Genitourinary: Negative for dysuria, flank pain, vaginal bleeding and vaginal discharge.  Musculoskeletal: Negative for neck pain.  Skin: Negative for rash.  Allergic/Immunologic: Negative for immunocompromised state.  Neurological: Negative for syncope and headaches.  Hematological: Does not bruise/bleed easily.  All other systems reviewed and are negative.    Physical Exam Updated Vital Signs BP 94/63 (BP Location: Left Arm)   Pulse (!) 109   Temp 99 F (37.2 C) (Oral)   Resp 20   Ht 5\' 3"  (1.6 m)   Wt 138 lb (62.6 kg)   SpO2 96%   BMI 24.45 kg/m   Physical Exam   Constitutional: She is oriented to person, place, and time. She appears well-developed and well-nourished. She appears distressed.  HENT:  Head: Normocephalic and atraumatic.  Eyes: Conjunctivae are normal.  Neck: Neck supple.  Cardiovascular: Regular rhythm and normal heart sounds.  Tachycardia present.  Exam reveals no friction rub.   No murmur heard. Pulmonary/Chest: Effort normal. Tachypnea noted. No respiratory distress. She has no wheezes. She has no rhonchi. She has rales in the right lower field and the left lower field.  Abdominal: She exhibits no distension.  Musculoskeletal: She exhibits no edema.  Neurological: She is alert and oriented to person, place, and time. She exhibits normal muscle tone.  Skin: Skin is warm. Capillary refill takes less than 2 seconds.  Psychiatric: She has a normal mood and affect.  Nursing note and vitals reviewed.    ED Treatments / Results  Labs (all labs ordered are listed, but only abnormal results are displayed) Labs Reviewed  CBC WITH DIFFERENTIAL/PLATELET - Abnormal; Notable for the following:       Result Value   RBC 3.85 (*)    Hemoglobin 11.1 (*)    HCT 34.8 (*)    RDW 17.6 (*)    Monocytes Absolute 1.3 (*)    All other components within normal limits  BASIC METABOLIC PANEL - Abnormal; Notable for the following:    Sodium 132 (*)    Chloride 100 (*)    Calcium 8.8 (*)    All other components within normal limits  BLOOD GAS, VENOUS - Abnormal; Notable for the following:    pCO2, Ven 39.2 (*)    All other components within normal limits  BRAIN NATRIURETIC PEPTIDE - Abnormal; Notable for the following:    B Natriuretic Peptide 415.9 (*)    All other components within normal limits  PROTIME-INR - Abnormal; Notable for the following:    Prothrombin Time 19.4 (*)    All other components within normal limits  CULTURE, BLOOD (ROUTINE X 2)  CULTURE, BLOOD (ROUTINE X 2)  MRSA PCR SCREENING  INFLUENZA PANEL BY PCR (TYPE A & B)   MAGNESIUM  PHOSPHORUS  TROPONIN I  BASIC METABOLIC PANEL  TROPONIN I  TROPONIN I  Alphonzo Lemmings, ED    EKG  EKG Interpretation  Date/Time:  Tuesday February 08 2016 18:06:38 EST Ventricular Rate:  69 PR Interval:    QRS Duration:  75 QT Interval:  638 QTC Calculation: 600 R Axis:   -31 Text Interpretation:  Atrial fibrillation Left axis deviation Low voltage, precordial leads RSR' in V1 or V2, right VCD or RVH Prolonged QT interval Since last tracing QT has lengthened Confirmed by Eulis Foster  MD, ELLIOTT [phone]) on 02/09/2016 12:43:11 AM       Radiology Dg Chest 2 View  Result Date: 02/08/2016 CLINICAL DATA:  Worsening shortness of breath for several months. Hypoxia. EXAM: CHEST  2 VIEW COMPARISON:  02/01/2016 FINDINGS: Stable cardiomegaly. Increased pulmonary vascular congestion. No evidence of pulmonary consolidation. Decreased lung volumes seen with new small bilateral pleural effusions and bibasilar atelectasis. IMPRESSION: Cardiomegaly with increased pulmonary vascular congestion. New small bilateral pleural effusions and bibasilar atelectasis. No evidence of pulmonary consolidation. Electronically Signed   By: Earle Gell M.D.   On: 02/08/2016 19:08    Procedures Procedures (including critical care time)  Medications Ordered in ED Medications  furosemide (LASIX) injection 40 mg (not administered)  potassium chloride SA (K-DUR,KLOR-CON) CR tablet 20 mEq (not administered)  predniSONE (DELTASONE) tablet 2.5 mg (not administered)  pravastatin (PRAVACHOL) tablet 20 mg (20 mg Oral Given 02/09/16 0041)  fluticasone (FLONASE) 50 MCG/ACT nasal spray 2 spray (not administered)  acidophilus (RISAQUAD) capsule 1 capsule (not administered)  guaiFENesin (MUCINEX) 12 hr tablet 600 mg (not administered)  sodium chloride flush (NS) 0.9 % injection 3 mL (3 mLs Intravenous Given 02/08/16 2300)  enalapril (VASOTEC) tablet 2.5 mg (not administered)  Warfarin - Pharmacist Dosing  Inpatient (not administered)  warfarin (COUMADIN) tablet 5 mg (not administered)  ipratropium-albuterol (DUONEB) 0.5-2.5 (3) MG/3ML nebulizer solution 3 mL (3 mLs Nebulization Given 02/08/16 1916)  furosemide (LASIX) injection 40 mg (40 mg Intravenous Given 02/08/16 2102)  acetaminophen (TYLENOL) tablet 1,000 mg (1,000 mg Oral Given 02/08/16 2140)  magnesium sulfate IVPB 2 g 50 mL (2 g Intravenous Given 02/09/16 0027)     Initial Impression / Assessment and Plan / ED Course  I have reviewed the triage vital signs and the nursing notes.  Pertinent labs & imaging results that were available during my care of the patient were reviewed by me and considered in my medical decision making (see chart for details).    76 yo F with PMHx as above here with acute on chronic SOB. On exam, pt tachypneic with b/l rales. CXR shows pulmonary edema and BNP mildly elevated. No leukocytosis, fever, or evidence to suggest PNA. Suspect sx are 2/2 acute CHF. Will give Iv lasix, admit to tele bed. Pt in agreement. She is improved with O2 and lasix and do not feel BIPAP needed. BP at goal.  Final Clinical Impressions(s) / ED Diagnoses   Final diagnoses:  Acute on chronic diastolic CHF (congestive heart failure) [facility])    New Prescriptions Current Discharge Medication List       Duffy Bruce, MD 02/09/16 1216

## 2016-02-08 NOTE — Telephone Encounter (Signed)
States patient is in acute distress.  Wants to know if patient can be worked in today or what Dr. [place] Bells would patient to do until tomorrow.

## 2016-02-08 NOTE — Telephone Encounter (Signed)
[facility] nurse calling to check status of request please advise.Hillery Hunter

## 2016-02-08 NOTE — Telephone Encounter (Signed)
Spoke with Anderson Malta at [facility] aware of recs.   Spoke with pt, aware of recs.  Pt was very hesitant to go back to hospital.  After speaking to pt for several minutes she reluctantly agreed to go to ED.   Nothing further needed at this time.

## 2016-02-09 ENCOUNTER — Ambulatory Visit: Payer: Medicare Other | Admitting: Emergency Medicine

## 2016-02-09 ENCOUNTER — Ambulatory Visit: Payer: Medicare Other | Admitting: Physician Assistant

## 2016-02-09 DIAGNOSIS — Z85828 Personal history of other malignant neoplasm of skin: Secondary | ICD-10-CM | POA: Diagnosis not present

## 2016-02-09 DIAGNOSIS — D509 Iron deficiency anemia, unspecified: Secondary | ICD-10-CM | POA: Diagnosis present

## 2016-02-09 DIAGNOSIS — I482 Chronic atrial fibrillation: Secondary | ICD-10-CM

## 2016-02-09 DIAGNOSIS — Z9889 Other specified postprocedural states: Secondary | ICD-10-CM | POA: Diagnosis not present

## 2016-02-09 DIAGNOSIS — M069 Rheumatoid arthritis, unspecified: Secondary | ICD-10-CM | POA: Diagnosis present

## 2016-02-09 DIAGNOSIS — I110 Hypertensive heart disease with heart failure: Secondary | ICD-10-CM | POA: Diagnosis present

## 2016-02-09 DIAGNOSIS — Z7951 Long term (current) use of inhaled steroids: Secondary | ICD-10-CM | POA: Diagnosis not present

## 2016-02-09 DIAGNOSIS — I5033 Acute on chronic diastolic (congestive) heart failure: Secondary | ICD-10-CM

## 2016-02-09 DIAGNOSIS — Z90710 Acquired absence of both cervix and uterus: Secondary | ICD-10-CM | POA: Diagnosis not present

## 2016-02-09 DIAGNOSIS — E785 Hyperlipidemia, unspecified: Secondary | ICD-10-CM | POA: Diagnosis present

## 2016-02-09 DIAGNOSIS — Z880 Allergy status to penicillin: Secondary | ICD-10-CM | POA: Diagnosis not present

## 2016-02-09 DIAGNOSIS — K219 Gastro-esophageal reflux disease without esophagitis: Secondary | ICD-10-CM | POA: Diagnosis present

## 2016-02-09 DIAGNOSIS — M8580 Other specified disorders of bone density and structure, unspecified site: Secondary | ICD-10-CM | POA: Diagnosis present

## 2016-02-09 DIAGNOSIS — J309 Allergic rhinitis, unspecified: Secondary | ICD-10-CM | POA: Diagnosis present

## 2016-02-09 DIAGNOSIS — D649 Anemia, unspecified: Secondary | ICD-10-CM | POA: Diagnosis not present

## 2016-02-09 DIAGNOSIS — Z8249 Family history of ischemic heart disease and other diseases of the circulatory system: Secondary | ICD-10-CM | POA: Diagnosis not present

## 2016-02-09 DIAGNOSIS — I10 Essential (primary) hypertension: Secondary | ICD-10-CM | POA: Diagnosis not present

## 2016-02-09 DIAGNOSIS — I272 Pulmonary hypertension, unspecified: Secondary | ICD-10-CM | POA: Diagnosis present

## 2016-02-09 DIAGNOSIS — Z79899 Other long term (current) drug therapy: Secondary | ICD-10-CM | POA: Diagnosis not present

## 2016-02-09 DIAGNOSIS — Z7901 Long term (current) use of anticoagulants: Secondary | ICD-10-CM | POA: Diagnosis not present

## 2016-02-09 DIAGNOSIS — Z885 Allergy status to narcotic agent status: Secondary | ICD-10-CM | POA: Diagnosis not present

## 2016-02-09 DIAGNOSIS — Z888 Allergy status to other drugs, medicaments and biological substances status: Secondary | ICD-10-CM | POA: Diagnosis not present

## 2016-02-09 LAB — BASIC METABOLIC PANEL
Anion gap: 10 (ref 5–15)
BUN: 12 mg/dL (ref 6–20)
CHLORIDE: 101 mmol/L (ref 101–111)
CO2: 25 mmol/L (ref 22–32)
CREATININE: 0.7 mg/dL (ref 0.44–1.00)
Calcium: 8.9 mg/dL (ref 8.9–10.3)
GFR calc Af Amer: >60 mL/min (ref >60)
GFR calc non Af Amer: >60 mL/min (ref >60)
GLUCOSE: 132 mg/dL — AB (ref 65–99)
POTASSIUM: 3.3 mmol/L — AB (ref 3.5–5.1)
Sodium: 136 mmol/L (ref 135–145)

## 2016-02-09 LAB — TROPONIN I
Troponin I: <0.03 ng/mL (ref <0.03)
Troponin I: <0.03 ng/mL (ref <0.03)
Troponin I: <0.03 ng/mL (ref <0.03)

## 2016-02-09 LAB — PROTIME-INR
INR: 1.57
INR: 1.62
PROTHROMBIN TIME: 19.4 s — AB (ref 11.4–15.2)
Prothrombin Time: 18.9 seconds — ABNORMAL HIGH (ref 11.4–15.2)

## 2016-02-09 LAB — MRSA PCR SCREENING: MRSA BY PCR: POSITIVE — AB

## 2016-02-09 MED ORDER — ACETAMINOPHEN 325 MG PO TABS
650.0000 mg | ORAL_TABLET | ORAL | Status: DC | PRN
  Administered 2016-02-09 – 2016-02-10 (×2): 650 mg via ORAL
  Filled 2016-02-09 (×2): qty 2

## 2016-02-09 MED ORDER — VERAPAMIL HCL 120 MG PO TABS
120.0000 mg | ORAL_TABLET | Freq: Two times a day (BID) | ORAL | Status: DC
  Administered 2016-02-09 – 2016-02-11 (×4): 120 mg via ORAL
  Filled 2016-02-09 (×6): qty 1

## 2016-02-09 MED ORDER — POTASSIUM CHLORIDE CRYS ER 20 MEQ PO TBCR
40.0000 meq | EXTENDED_RELEASE_TABLET | Freq: Once | ORAL | Status: AC
  Administered 2016-02-09: 40 meq via ORAL
  Filled 2016-02-09: qty 2

## 2016-02-09 MED ORDER — ACETAMINOPHEN 500 MG PO TABS: 500.0000 mg | ORAL_TABLET | Freq: Once | ORAL | Status: DC | PRN

## 2016-02-09 MED ORDER — ENSURE ENLIVE PO LIQD
237.0000 mL | Freq: Two times a day (BID) | ORAL | Status: DC
  Administered 2016-02-09 – 2016-02-11 (×4): 237 mL via ORAL

## 2016-02-09 MED ORDER — MUPIROCIN 2 % EX OINT
1.0000 "application " | TOPICAL_OINTMENT | Freq: Two times a day (BID) | CUTANEOUS | Status: DC
  Administered 2016-02-09 – 2016-02-11 (×5): 1 via NASAL
  Filled 2016-02-09 (×2): qty 22

## 2016-02-09 MED ORDER — METOPROLOL SUCCINATE ER 100 MG PO TB24
100.0000 mg | ORAL_TABLET | Freq: Every day | ORAL | Status: DC
  Administered 2016-02-09 – 2016-02-10 (×2): 100 mg via ORAL
  Filled 2016-02-09 (×2): qty 1

## 2016-02-09 MED ORDER — CHLORHEXIDINE GLUCONATE CLOTH 2 % EX PADS
6.0000 | MEDICATED_PAD | Freq: Every day | CUTANEOUS | Status: DC
  Administered 2016-02-09 – 2016-02-11 (×2): 6 via TOPICAL

## 2016-02-09 MED ORDER — WARFARIN SODIUM 5 MG PO TABS
5.0000 mg | ORAL_TABLET | Freq: Every day | ORAL | Status: DC
  Administered 2016-02-09 (×2): 5 mg via ORAL
  Filled 2016-02-09 (×2): qty 1

## 2016-02-09 NOTE — Progress Notes (Signed)
PROGRESS NOTE [facility]   [name]   [id number] DOB: Oct 18, 1940  DOA: 02/08/2016 PCP: Leeroy Cha, MD   Brief Narrative:  [name] is a 76 y.o. female with medical history significant of rosacea, allergic rhinitis, chronic atrial fibrillation on warfarin, chronic bronchitis, GERD, frequent epistaxis due to warfarin use, hyperlipidemia, hypertension, osteopenia, multiple episodes of pneumonia, prolonged QT interval history, pulmonary hypertension rheumatoid arthritis presented to the emergency department due to progressively worse shortness of breath for 1 day PTA. Patient admitted for CHF exacerbation and started on IV Lasix.   Subjective: Patient seen and examined in the AM feeling somewhat better. Denies chest pain, SOB and palpitations   Assessment & Plan: Acute on chronic diastolic CHF (congestive heart failure) BNP > 400, small pleural effusion  Last ECHO on 12/2915 - LV EF 45-50% with Diffuse hypokinesis  Continue furosemide 40 mg IV BID Strict I&Os Daily weights  Monitor Cr wihile aggressive diuresis  Check BMP in AM   Hypertension - stable  Resume BB and CCB Monitor blood pressure.  Hyperlipidemia Continue pravastatin.  Rheumatoid arthritis ([place]) Stable. Continue prednisone  Hold methotrexate for now. Monitor LFTs periodically.  Chronic atrial fibrillation RVR BB was held due to low BP INR subtherapeutic  CHA2DS2-VASc Score of at least 4. Resume Toprol  Monitor Telemetry Warfarin per pharmacy  Monitor INR goal 2-3   GERD (gastroesophageal reflux disease) Pepcid 20 mg by mouth twice a day.  Anemia unknown etiology  Anemia panel Monitor H&H  DVT prophylaxis: Warfarin  Code Status: FULL Family Communication: None at bedside  Disposition Plan: Home when medically stable possible 24-48 hrs.   Consultants:   None   Procedures:   None   Antimicrobials:  None   Objective: Vitals:   02/09/16 0021  02/09/16 0627 02/09/16 1535 02/09/16 1830  BP: 94/63 112/74 107/69   Pulse: (!) 109 97 (!) 119   Resp:  19    Temp:  98.9 F (37.2 C) 99 F (37.2 C) (!) 101.1 F (38.4 C)  TempSrc:  Oral Oral   SpO2: 96% 96% 98%   Weight:  62.4 kg (137 lb 9.1 oz)    Height:  5\' 3"  (1.6 m)      Intake/Output Summary (Last 24 hours) at 02/09/16 2124 Last data filed at 02/09/16 1900  Gross per 24 hour  Intake              480 ml  Output             1200 ml  Net             -720 ml   Filed Weights   02/08/16 0021 02/08/16 2023 02/09/16 0627  Weight: 62.3 kg (137 lb 5.6 oz) 62.6 kg (138 lb) 62.4 kg (137 lb 9.1 oz)    Examination:  General exam: Appears calm and comfortable  Respiratory system: Decrease Bs, crackles at the bases no wheezing  Cardiovascular system: S1 & S2 heard, Irr Irr. No JVD, murmurs, rubs or gallops Gastrointestinal system: Abdomen is nondistended, soft and nontender. Normal bowel sounds heard. Central nervous system: Alert and oriented. Extremities: No pedal edema. Symmetric  Skin: No rashes  Data Reviewed: I have personally reviewed following labs and imaging studies  CBC:  Recent Labs Lab 02/08/16 1950  WBC 9.4  NEUTROABS 7.1  HGB 11.1*  HCT 34.8*  MCV 90.4  PLT [id number]   Basic Metabolic Panel:  Recent Labs Lab 02/08/16 1950 02/09/16 0850  NA 132*  136  K 4.0 3.3*  CL 100* 101  CO2 24 25  GLUCOSE 99 132*  BUN 13 12  CREATININE 0.71 0.70  CALCIUM 8.8* 8.9  MG 2.0  --   PHOS 3.1  --    GFR: Estimated Creatinine Clearance: 51 mL/min (by C-G formula based on SCr of 0.7 mg/dL). Liver Function Tests: No results for input(s): AST, ALT, ALKPHOS, BILITOT, PROT, ALBUMIN in the last 168 hours. No results for input(s): LIPASE, AMYLASE in the last 168 hours. No results for input(s): AMMONIA in the last 168 hours. Coagulation Profile:  Recent Labs Lab 02/04/16 02/09/16 0006 02/09/16 0542  INR 3.3 1.62 1.57   Cardiac Enzymes:  Recent Labs Lab  02/09/16 0005 02/09/16 0850 02/09/16 1428  TROPONINI <0.03 <0.03 <0.03   BNP (last 3 results)  Recent Labs  12/13/15 1704  PROBNP 215.0*   HbA1C: No results for input(s): HGBA1C in the last 72 hours. CBG: No results for input(s): GLUCAP in the last 168 hours. Lipid Profile: No results for input(s): CHOL, HDL, LDLCALC, TRIG, CHOLHDL, LDLDIRECT in the last 72 hours. Thyroid Function Tests: No results for input(s): TSH, T4TOTAL, FREET4, T3FREE, THYROIDAB in the last 72 hours. Anemia Panel: No results for input(s): VITAMINB12, FOLATE, FERRITIN, TIBC, IRON, RETICCTPCT in the last 72 hours. Sepsis Labs: No results for input(s): PROCALCITON, LATICACIDVEN in the last 168 hours.  Recent Results (from the past 240 hour(s))  Blood culture (routine x 2)     Status: None (Preliminary result)   Collection Time: 02/08/16  7:50 PM  Result Value Ref Range Status   Specimen Description BLOOD RIGHT AC  Final   Special Requests BOTTLES DRAWN AEROBIC AND ANAEROBIC 5CC  Final   Culture   Final    NO GROWTH < 24 HOURS Performed at [facility], [place] [address], [place], [place] [address]    Report Status PENDING  Incomplete  MRSA PCR Screening     Status: Abnormal   Collection Time: 02/09/16  2:05 AM  Result Value Ref Range Status   MRSA by PCR POSITIVE (A) NEGATIVE Final    Comment:        The GeneXpert MRSA Assay (FDA approved for NASAL specimens only), is one component of a comprehensive MRSA colonization surveillance program. It is not intended to diagnose MRSA infection nor to guide or monitor treatment for MRSA infections. RESULT CALLED TO, READ BACK BY AND VERIFIED WITH: BLAINE,T ON [id number] AT [place] BY [place]      Radiology Studies: Dg Chest 2 View  Result Date: 02/08/2016 CLINICAL DATA:  Worsening shortness of breath for several months. Hypoxia. EXAM: CHEST  2 VIEW COMPARISON:  02/01/2016 FINDINGS: Stable cardiomegaly. Increased pulmonary vascular congestion. No  evidence of pulmonary consolidation. Decreased lung volumes seen with new small bilateral pleural effusions and bibasilar atelectasis. IMPRESSION: Cardiomegaly with increased pulmonary vascular congestion. New small bilateral pleural effusions and bibasilar atelectasis. No evidence of pulmonary consolidation. Electronically Signed   By: Earle Gell M.D.   On: 02/08/2016 19:08     Scheduled Meds: . acidophilus  1 capsule Oral Daily  . Chlorhexidine Gluconate Cloth  6 each Topical Q0600  . enalapril  2.5 mg Oral Daily  . feeding supplement (ENSURE ENLIVE)  237 mL Oral BID BM  . furosemide  40 mg Intravenous BID  . mupirocin ointment  1 application Nasal BID  . potassium chloride  20 mEq Oral Daily  . pravastatin  20 mg Oral QHS  . predniSONE  2.5 mg Oral Q breakfast  . sodium chloride flush  3 mL Intravenous Q12H  . warfarin  5 mg Oral q1800  . Warfarin - Pharmacist Dosing Inpatient   Does not apply q1800   Continuous Infusions:   LOS: 0 days    Chipper Oman, MD [facility] Pager [phone]  If 7PM-7AM, please contact night-coverage [email or web address] Password [facility] 02/09/2016, 9:24 PM

## 2016-02-09 NOTE — Progress Notes (Signed)
Called pt home and was informed by Charlotte Crumb, pt husband, she was admitted to hospital. Will attempt to contact patient on later day with xray results.

## 2016-02-09 NOTE — Progress Notes (Signed)
[facility]  Patient Status: Active HRI effective 01/13/2016  [facility] is providing the following services: Pt, Rn, SW   If patient discharges after hours, please call [phone].   [place], SW Navigator 02/09/2016, 7:39 AM  [phone]

## 2016-02-09 NOTE — Progress Notes (Signed)
ANTICOAGULATION CONSULT NOTE - Initial Consult  Pharmacy Consult for warfarin Indication: atrial fibrillation  Allergies  Allergen Reactions  . Arthrotec [Diclofenac-Misoprostol] Diarrhea  . Erythromycin Anaphylaxis       . Morphine Sulfate Other (See Comments)    Reaction:  Hallucinations   . Amoxicillin Rash and Other (See Comments)    Has patient had a PCN reaction causing immediate rash, facial/tongue/throat swelling, SOB or lightheadedness with hypotension: Yes Has patient had a PCN reaction causing severe rash involving mucus membranes or skin necrosis: Yes Has patient had a PCN reaction that required hospitalization No Has patient had a PCN reaction occurring within the last 10 years: No If all of the above answers are "NO", then may proceed with Cephalosporin use.  . Diltiazem Hcl Swelling, Rash and Other (See Comments)    Pt is able to tolerate Verapamil.    . Gold-Containing Drug Products Rash  . Macrodantin [Nitrofurantoin Macrocrystal] Rash  . Naproxen Rash  . Penicillins Rash and Other (See Comments)    ++ tolerates cefepime and ceftriaxone ++ Has patient had a PCN reaction causing immediate rash, facial/tongue/throat swelling, SOB or lightheadedness with hypotension: Yes Has patient had a PCN reaction causing severe rash involving mucus membranes or skin necrosis: Yes Has patient had a PCN reaction that required hospitalization No Has patient had a PCN reaction occurring within the last 10 years: No If all of the above answers are "NO", then may proceed with Cephalosporin use.     Patient Measurements: Height: 5\' 3"  (160 cm) Weight: 138 lb (62.6 kg) IBW/kg (Calculated) : 52.4 Heparin Dosing Weight:   Vital Signs: Temp: 99 F (37.2 C) (01/23 2245) Temp Source: Oral (01/23 2245) BP: 94/63 (01/24 0021) Pulse Rate: 109 (01/24 0021)  Labs:  Recent Labs  02/08/16 1950 02/09/16 0005 02/09/16 0006  HGB 11.1*  --   --   HCT 34.8*  --   --   PLT 197  --    --   LABPROT  --   --  19.4*  INR  --   --  1.62  CREATININE 0.71  --   --   TROPONINI  --  <0.03  --     Estimated Creatinine Clearance: 51 mL/min (by C-G formula based on SCr of 0.71 mg/dL).   Medical History: Past Medical History:  Diagnosis Date  . Acne rosacea   . Adhesive capsulitis of left shoulder 1980  . Allergic rhinitis   . Chronic atrial fibrillation ([place])    a. Coumadin anticoagulation - [facility] ([place])  . Chronic bronchitis ([place])   . Esophageal reflux   . Frequent epistaxis    "cause of my coumadin" (12/16/2015)  . Heart murmur   . History of echocardiogram    a. Echo 4/17:  EF 55-60%, no RWMA, mild MR, mild LAE, PASP 48 mmHg, trivial effusion post to heart  . Hypercholesterolemia   . Hyperlipidemia   . Hypertension   . Osteopenia   . Pleural effusion associated with pulmonary infection    Admx 5/17 >> required L thoracentesis (cytology neg for malignancy)   . Pneumonia    "I've had it 4 times this year" (12/16/2015)  . Polymorphic light eruption 2004  . Prolonged QT interval   . Pulmonary HTN   . Respiratory failure ([place]) 04/2015   "spent 3 days; went home; then another 13 days in hospital" (12/16/2015  . Rheumatoid arthritis ([place]) 1975  . Right middle ear infection 1986  . Skin cancer    "  burndt it off at the right side of my nose/face" (12/16/2015)    Medications:  Prescriptions Prior to Admission  Medication Sig Dispense Refill Last Dose  . acidophilus (RISAQUAD) CAPS capsule Take 1 capsule by mouth daily.   02/08/2016 at Unknown time  . cholecalciferol (VITAMIN D) 1000 UNITS tablet Take 2,000 Units by mouth daily.   02/08/2016 at Unknown time  . fluticasone (FLONASE) 50 MCG/ACT nasal spray Place 2 sprays into both nostrils daily as needed for rhinitis.    02/08/2016 at Unknown time  . folic acid (FOLVITE) 1 MG tablet Take 2 mg by mouth daily.    02/08/2016 at Unknown time  . furosemide (LASIX) 40 MG tablet Take 40 mg by mouth every evening.    02/07/2016 at Unknown time  . guaiFENesin (MUCINEX) 600 MG 12 hr tablet Take 600 mg by mouth 2 (two) times daily as needed for cough or to loosen phlegm.    Past Week at Unknown time  . methotrexate (RHEUMATREX) 2.5 MG tablet Take 5-7.5 mg by mouth 2 (two) times a week. Pt takes three tablets every Tuesday and two tablets every Wednesday.  1 Past Week at Unknown time  . metoprolol succinate (TOPROL XL) 25 MG 24 hr tablet Take 1 tablet (25 mg total) by mouth 2 (two) times daily. To take along with metoprolol XL 100mg  for a total of 125 mg BID 60 tablet 1 02/08/2016 at 0700  . metoprolol succinate (TOPROL-XL) 100 MG 24 hr tablet Take 1 tablet (100 mg total) by mouth 2 (two) times daily. Take with or immediately following a meal. 60 tablet 0 02/08/2016 at 0700  . Multiple Vitamins-Minerals (MULTIVITAMIN WITH MINERALS) tablet Take 1 tablet by mouth daily.   02/08/2016 at Unknown time  . potassium chloride (K-DUR,KLOR-CON) 10 MEQ tablet Take 2 tablets (20 mEq total) by mouth daily. 60 tablet 1 02/08/2016 at Unknown time  . pravastatin (PRAVACHOL) 20 MG tablet Take 20 mg by mouth at bedtime.   02/07/2016 at Unknown time  . predniSONE (DELTASONE) 2.5 MG tablet Take 5 mg daily for 1 week; then resume daily dose of 2.5mg  daily   02/08/2016 at Unknown time  . ranitidine (ZANTAC) 150 MG tablet Take 1 tablet (150 mg total) by mouth at bedtime. 30 tablet 0 Past Month at Unknown time  . verapamil (CALAN) 120 MG tablet Take 1 tablet (120 mg total) by mouth every 12 (twelve) hours. 60 tablet 0 02/08/2016 at Unknown time  . warfarin (COUMADIN) 5 MG tablet Take 1 tablet (5 mg total) by mouth daily. 90 tablet 3 02/07/2016 at 2000   Scheduled:  . acidophilus  1 capsule Oral Daily  . enalapril  2.5 mg Oral Daily  . furosemide  40 mg Intravenous BID  . potassium chloride  20 mEq Oral Daily  . pravastatin  20 mg Oral QHS  . predniSONE  2.5 mg Oral Q breakfast  . sodium chloride flush  3 mL Intravenous Q12H  . warfarin  5 mg  Oral q1800  . Warfarin - Pharmacist Dosing Inpatient   Does not apply q1800    Assessment: Patient with chronic warfarin for afib.  INR < 2 on admit.  Last dose warfarin noted to be 1/22.    Goal of Therapy:  INR 2-3    Plan:  Warfarin 5mg  po now and at 1800 daily Daily INR  Nani Skillern Crowford 02/09/2016,3:04 AM

## 2016-02-10 ENCOUNTER — Inpatient Hospital Stay (HOSPITAL_COMMUNITY): Admission: RE | Admit: 2016-02-10 | Payer: Medicare Other | Source: Ambulatory Visit

## 2016-02-10 ENCOUNTER — Ambulatory Visit (HOSPITAL_COMMUNITY): Admission: RE | Admit: 2016-02-10 | Payer: Medicare Other | Source: Ambulatory Visit

## 2016-02-10 DIAGNOSIS — D509 Iron deficiency anemia, unspecified: Secondary | ICD-10-CM

## 2016-02-10 LAB — RESPIRATORY PANEL BY PCR
Adenovirus: NOT DETECTED
Bordetella pertussis: NOT DETECTED
CHLAMYDOPHILA PNEUMONIAE-RVPPCR: NOT DETECTED
Coronavirus 229E: NOT DETECTED
Coronavirus HKU1: NOT DETECTED
Coronavirus NL63: NOT DETECTED
Coronavirus OC43: NOT DETECTED
INFLUENZA A-RVPPCR: NOT DETECTED
INFLUENZA B-RVPPCR: NOT DETECTED
MYCOPLASMA PNEUMONIAE-RVPPCR: NOT DETECTED
Metapneumovirus: NOT DETECTED
PARAINFLUENZA VIRUS 4-RVPPCR: NOT DETECTED
Parainfluenza Virus 1: NOT DETECTED
Parainfluenza Virus 2: NOT DETECTED
Parainfluenza Virus 3: NOT DETECTED
RESPIRATORY SYNCYTIAL VIRUS-RVPPCR: NOT DETECTED
Rhinovirus / Enterovirus: NOT DETECTED

## 2016-02-10 LAB — IRON AND TIBC
IRON: 11 ug/dL — AB (ref 28–170)
Saturation Ratios: 4 % — ABNORMAL LOW (ref 10.4–31.8)
TIBC: 297 ug/dL (ref 250–450)
UIBC: 286 ug/dL

## 2016-02-10 LAB — CBC
HEMATOCRIT: 39.4 % (ref 36.0–46.0)
Hemoglobin: 13 g/dL (ref 12.0–15.0)
MCH: 29.3 pg (ref 26.0–34.0)
MCHC: 33 g/dL (ref 30.0–36.0)
MCV: 88.9 fL (ref 78.0–100.0)
Platelets: 279 10*3/uL (ref 150–400)
RBC: 4.43 MIL/uL (ref 3.87–5.11)
RDW: 17.5 % — AB (ref 11.5–15.5)
WBC: 10.3 10*3/uL (ref 4.0–10.5)

## 2016-02-10 LAB — PROTIME-INR
INR: 1.88
Prothrombin Time: 21.9 seconds — ABNORMAL HIGH (ref 11.4–15.2)

## 2016-02-10 LAB — BASIC METABOLIC PANEL
Anion gap: 9 (ref 5–15)
BUN: 15 mg/dL (ref 6–20)
CALCIUM: 8.9 mg/dL (ref 8.9–10.3)
CO2: 25 mmol/L (ref 22–32)
CREATININE: 0.68 mg/dL (ref 0.44–1.00)
Chloride: 100 mmol/L — ABNORMAL LOW (ref 101–111)
GFR calc Af Amer: >60 mL/min (ref >60)
GLUCOSE: 107 mg/dL — AB (ref 65–99)
Potassium: 3.9 mmol/L (ref 3.5–5.1)
Sodium: 134 mmol/L — ABNORMAL LOW (ref 135–145)

## 2016-02-10 LAB — VITAMIN B12: Vitamin B-12: 620 pg/mL (ref 180–914)

## 2016-02-10 LAB — FERRITIN: FERRITIN: 240 ng/mL (ref 11–307)

## 2016-02-10 LAB — RETICULOCYTES
RBC.: 4.43 MIL/uL (ref 3.87–5.11)
RETIC COUNT ABSOLUTE: 48.7 10*3/uL (ref 19.0–186.0)
Retic Ct Pct: 1.1 % (ref 0.4–3.1)

## 2016-02-10 LAB — FOLATE: Folate: 18 ng/mL (ref >5.9)

## 2016-02-10 LAB — MAGNESIUM: Magnesium: 2.1 mg/dL (ref 1.7–2.4)

## 2016-02-10 MED ORDER — FLUTICASONE PROPIONATE 50 MCG/ACT NA SUSP
1.0000 | Freq: Every day | NASAL | Status: DC
  Administered 2016-02-10 – 2016-02-11 (×2): 1 via NASAL
  Filled 2016-02-10: qty 16

## 2016-02-10 MED ORDER — FERROUS SULFATE 325 (65 FE) MG PO TABS
325.0000 mg | ORAL_TABLET | Freq: Two times a day (BID) | ORAL | Status: DC
  Administered 2016-02-10 – 2016-02-11 (×2): 325 mg via ORAL
  Filled 2016-02-10 (×2): qty 1

## 2016-02-10 MED ORDER — WARFARIN SODIUM 5 MG PO TABS
7.5000 mg | ORAL_TABLET | Freq: Once | ORAL | Status: AC
  Administered 2016-02-10: 7.5 mg via ORAL
  Filled 2016-02-10: qty 1

## 2016-02-10 MED ORDER — METOPROLOL SUCCINATE ER 100 MG PO TB24
100.0000 mg | ORAL_TABLET | Freq: Two times a day (BID) | ORAL | Status: DC
  Administered 2016-02-10 – 2016-02-11 (×3): 100 mg via ORAL
  Filled 2016-02-10 (×3): qty 1

## 2016-02-10 MED ORDER — FUROSEMIDE 10 MG/ML IJ SOLN
20.0000 mg | Freq: Two times a day (BID) | INTRAMUSCULAR | Status: DC
  Administered 2016-02-10 – 2016-02-11 (×2): 20 mg via INTRAVENOUS
  Filled 2016-02-10 (×2): qty 2

## 2016-02-10 MED ORDER — METOPROLOL TARTRATE 5 MG/5ML IV SOLN: 2.5000 mg | Freq: Once | INTRAVENOUS | Status: DC

## 2016-02-10 NOTE — Care Management Note (Signed)
Case Management Note  Patient Details  Name: [name] MRN: [id number] Date of Birth: January 10, 1941  Subjective/Objective:  Pt admitted with cco SOB, CHF                  Action/Plan:Pt from home  Expected Discharge Date:   (unknown)               Expected Discharge Plan:  Home/Self Care  In-House Referral:     Discharge planning Services  CM Consult  Post Acute Care Choice:    Choice offered to:     DME Arranged:    DME Agency:     HH Arranged:    HH Agency:     Status of Service:  In process, will continue to follow  If discussed at Long Length of Stay Meetings, dates discussed:    Additional CommentsPurcell Mouton, RN 02/10/2016, 3:27 PM

## 2016-02-10 NOTE — Progress Notes (Signed)
Atc to call pt. Received automated message, your party is not answering please try again later. Will attempt to contact patient at later date.

## 2016-02-10 NOTE — Progress Notes (Signed)
Initial Nutrition Assessment  DOCUMENTATION CODES:   Non-severe (moderate) malnutrition in context of chronic illness  INTERVENTION:    Continue Ensure Enlive po BID, each supplement provides 350 kcal and 20 grams of protein  NUTRITION DIAGNOSIS:   Malnutrition related to chronic illness as evidenced by moderate depletion of body fat, moderate depletions of muscle mass  GOAL:   Patient will meet greater than or equal to 90% of their needs  MONITOR:   PO intake, Supplement acceptance, Labs, Weight trends, I & O's  REASON FOR ASSESSMENT:   Malnutrition Screening Tool  ASSESSMENT:   76 y.o. female with medical history significant of rosacea, allergic rhinitis, chronic atrial fibrillation on warfarin, chronic bronchitis, GERD, frequent epistaxis due to warfarin use, hyperlipidemia, hypertension, osteopenia, multiple episodes of pneumonia, prolonged QT interval history, pulmonary hypertension rheumatoid arthritis presented to the emergency department due to progressively worse shortness of breath for 1 day PTA. Patient admitted for CHF exacerbation and started on IV Lasix.   Pt reports she has no appetite; has been running a fever. PO intake variable at 0-95% per flowsheet records. Also reveals her weight has been "falling off". Is drinking some of her Ensure Enlive supplements. Labs and medications reviewed.   Nutrition-Focused physical exam completed. Findings are moderate fat depletion, moderate muscle depletion, and no edema.   Diet Order:  Diet Heart Room service appropriate? Yes; Fluid consistency: Thin; Fluid restriction: 1200 mL Fluid  Skin:  Reviewed, no issues  Last BM:  1/23  Height:   Ht Readings from Last 1 Encounters:  02/09/16 5\' 3"  (1.6 m)    Weight:   Wt Readings from Last 1 Encounters:  02/10/16 134 lb 11.2 oz (61.1 kg)    Wt Readings from Last 10 Encounters:  02/10/16 134 lb 11.2 oz (61.1 kg)  02/01/16 144 lb 6.4 oz (65.5 kg)  01/16/16 137 lb  11.2 oz (62.5 kg)  01/04/16 144 lb 1.9 oz (65.4 kg)  12/28/15 143 lb 11.8 oz (65.2 kg)  12/16/15 148 lb 2.4 oz (67.2 kg)  12/13/15 146 lb 9.6 oz (66.5 kg)  11/23/15 145 lb 9.6 oz (66 kg)  10/12/15 151 lb (68.5 kg)  09/15/15 150 lb 6.4 oz (68.2 kg)    Ideal Body Weight:  52.2 kg  BMI:  Body mass index is 23.86 kg/m.  Estimated Nutritional Needs:   Kcal:  1500-1700  Protein:  70-80 gm  Fluid:  1.5-1.7 L  EDUCATION NEEDS:   No education needs identified at this time  Arthur Holms, RD, LDN Pager #: [phone] After-Hours Pager #: [phone]

## 2016-02-10 NOTE — Progress Notes (Signed)
PROGRESS NOTE [facility]   ARGELIA THUROW   [id number] DOB: Jul 08, 1940  DOA: 02/08/2016 PCP: Leeroy Cha, MD   Brief Narrative:  [name] is a 76 y.o. female with medical history significant of rosacea, allergic rhinitis, chronic atrial fibrillation on warfarin, chronic bronchitis, GERD, frequent epistaxis due to warfarin use, hyperlipidemia, hypertension, osteopenia, multiple episodes of pneumonia, prolonged QT interval history, pulmonary hypertension rheumatoid arthritis presented to the emergency department due to progressively worse shortness of breath for 1 day PTA. Patient admitted for CHF exacerbation and started on IV Lasix.   Subjective: Patient seen and examined doing much better, her breathing has improved. Now c/o some nasal congestion, ear and sinuses fullness, also mild fever last night.   Assessment & Plan: Acute on chronic diastolic CHF (congestive heart failure) BNP > 400, small pleural effusion. Good UOP Diuresis ~ 2L  Down 10 lb from 02/01/16 Last ECHO on 12/2915 - LV EF 45-50% with Diffuse hypokinesis  Lasix decreased to 20 mg IV - possible switch to PO in AM  Strict I&Os Daily weights  Monitor Cr wihile aggressive diuresis - Cr stable  Check Cr and electrolytes   Hypertension - stable  Resume BB and CCB Monitor blood pressure.  Hyperlipidemia Continue pravastatin.  Rheumatoid arthritis ([place]) Stable. Continue prednisone  Hold methotrexate for now. Monitor LFTs periodically.  Chronic atrial fibrillation  - HR poorly controlled, started last night on half dose of BB due to soft BP  CHA2DS2-VASc Score of at least 4. Will resume Toprol 100 mg BID  Monitor Telemetry Warfarin per pharmacy - INR continues to be subtherapeutic will give one dose of 7.5 mg tonight  Monitor INR goal 2-3   Allergic Rhinitis  Flonase added   GERD (gastroesophageal reflux disease) Pepcid 20 mg by mouth twice a day.  Anemia - Iron deficiency    Start iron supplements  Repeat CBC in AM  H/H stable   DVT prophylaxis: Warfarin  Code Status: FULL Family Communication: None at bedside  Disposition Plan: Home when medically stable possible 24 hrs.  Consultants:   None   Procedures:   None   Antimicrobials:  None   Objective: Vitals:   02/10/16 0833 02/10/16 1044 02/10/16 1125 02/10/16 1226  BP: (!) 109/54 110/67 (!) 88/65 (!) 102/54  Pulse: (!) 115 (!) 114 (!) 121 66  Resp:   (!) 22   Temp:   98.8 F (37.1 C)   TempSrc:   Oral   SpO2:   90%   Weight:      Height:        Intake/Output Summary (Last 24 hours) at 02/10/16 1639 Last data filed at 02/10/16 0924  Gross per 24 hour  Intake              420 ml  Output             1650 ml  Net            -1230 ml   Filed Weights   02/08/16 2023 02/09/16 0627 02/10/16 0650  Weight: 62.6 kg (138 lb) 62.4 kg (137 lb 9.1 oz) 61.1 kg (134 lb 11.2 oz)    Examination:  General exam: NAD  HEENT: Ear exam tympanic membranes intact and mobile, No erythema.  Respiratory system: Air entry improved - Crackles at b/l bases  Cardiovascular system: S1S2 Irr Irr. No JVD, murmurs, rubs or gallops Central nervous system: Alert and oriented. Extremities: No pedal edema.  Skin: No rashes  Data  Reviewed: I have personally reviewed following labs and imaging studies  CBC:  Recent Labs Lab 02/08/16 1950 02/10/16 0529  WBC 9.4 10.3  NEUTROABS 7.1  --   HGB 11.1* 13.0  HCT 34.8* 39.4  MCV 90.4 88.9  PLT 197 [id number]   Basic Metabolic Panel:  Recent Labs Lab 02/08/16 1950 02/09/16 0850 02/10/16 0529  NA 132* 136 134*  K 4.0 3.3* 3.9  CL 100* 101 100*  CO2 24 25 25   GLUCOSE 99 132* 107*  BUN 13 12 15   CREATININE 0.71 0.70 0.68  CALCIUM 8.8* 8.9 8.9  MG 2.0  --  2.1  PHOS 3.1  --   --    GFR: Estimated Creatinine Clearance: 51 mL/min (by C-G formula based on SCr of 0.68 mg/dL). Liver Function Tests: No results for input(s): AST, ALT, ALKPHOS, BILITOT, PROT,  ALBUMIN in the last 168 hours. No results for input(s): LIPASE, AMYLASE in the last 168 hours. No results for input(s): AMMONIA in the last 168 hours. Coagulation Profile:  Recent Labs Lab 02/04/16 02/09/16 0006 02/09/16 0542 02/10/16 0529  INR 3.3 1.62 1.57 1.88   Cardiac Enzymes:  Recent Labs Lab 02/09/16 0005 02/09/16 0850 02/09/16 1428  TROPONINI <0.03 <0.03 <0.03   BNP (last 3 results)  Recent Labs  12/13/15 1704  PROBNP 215.0*   HbA1C: No results for input(s): HGBA1C in the last 72 hours. CBG: No results for input(s): GLUCAP in the last 168 hours. Lipid Profile: No results for input(s): CHOL, HDL, LDLCALC, TRIG, CHOLHDL, LDLDIRECT in the last 72 hours. Thyroid Function Tests: No results for input(s): TSH, T4TOTAL, FREET4, T3FREE, THYROIDAB in the last 72 hours. Anemia Panel:  Recent Labs  02/10/16 0529  VITAMINB12 620  FOLATE 18.0  FERRITIN 240  TIBC 297  IRON 11*  RETICCTPCT 1.1   Sepsis Labs: No results for input(s): PROCALCITON, LATICACIDVEN in the last 168 hours.  Recent Results (from the past 240 hour(s))  Blood culture (routine x 2)     Status: None (Preliminary result)   Collection Time: 02/08/16  7:50 PM  Result Value Ref Range Status   Specimen Description BLOOD LEFT AC  Final   Special Requests BOTTLES DRAWN AEROBIC AND ANAEROBIC 5CC  Final   Culture   Final    NO GROWTH 2 DAYS Performed at [facility], [address], [place], [place] [address]    Report Status PENDING  Incomplete  Blood culture (routine x 2)     Status: None (Preliminary result)   Collection Time: 02/08/16  7:50 PM  Result Value Ref Range Status   Specimen Description BLOOD RIGHT AC  Final   Special Requests BOTTLES DRAWN AEROBIC AND ANAEROBIC 5CC  Final   Culture   Final    NO GROWTH 2 DAYS Performed at [facility], [place] [address], [place],  [address]    Report Status PENDING  Incomplete  MRSA PCR Screening     Status: Abnormal    Collection Time: 02/09/16  2:05 AM  Result Value Ref Range Status   MRSA by PCR POSITIVE (A) NEGATIVE Final    Comment:        The GeneXpert MRSA Assay (FDA approved for NASAL specimens only), is one component of a comprehensive MRSA colonization surveillance program. It is not intended to diagnose MRSA infection nor to guide or monitor treatment for MRSA infections. RESULT CALLED TO, READ BACK BY AND VERIFIED WITH: BLAINE,T ON [id number] AT 0917 BY INGLEE  Respiratory Panel by PCR     Status: None   Collection Time: 02/09/16  7:00 PM  Result Value Ref Range Status   Adenovirus NOT DETECTED NOT DETECTED Final   Coronavirus 229E NOT DETECTED NOT DETECTED Final   Coronavirus HKU1 NOT DETECTED NOT DETECTED Final   Coronavirus NL63 NOT DETECTED NOT DETECTED Final   Coronavirus OC43 NOT DETECTED NOT DETECTED Final   Metapneumovirus NOT DETECTED NOT DETECTED Final   Rhinovirus / Enterovirus NOT DETECTED NOT DETECTED Final   Influenza A NOT DETECTED NOT DETECTED Final   Influenza B NOT DETECTED NOT DETECTED Final   Parainfluenza Virus 1 NOT DETECTED NOT DETECTED Final   Parainfluenza Virus 2 NOT DETECTED NOT DETECTED Final   Parainfluenza Virus 3 NOT DETECTED NOT DETECTED Final   Parainfluenza Virus 4 NOT DETECTED NOT DETECTED Final   Respiratory Syncytial Virus NOT DETECTED NOT DETECTED Final   Bordetella pertussis NOT DETECTED NOT DETECTED Final   Chlamydophila pneumoniae NOT DETECTED NOT DETECTED Final   Mycoplasma pneumoniae NOT DETECTED NOT DETECTED Final    Comment: Performed at [facility], [place] [address] [place], [place] [address]     Radiology Studies: Dg Chest 2 View  Result Date: 02/08/2016 CLINICAL DATA:  Worsening shortness of breath for several months. Hypoxia. EXAM: CHEST  2 VIEW COMPARISON:  02/01/2016 FINDINGS: Stable cardiomegaly. Increased pulmonary vascular congestion. No evidence of pulmonary consolidation. Decreased lung volumes seen with new small  bilateral pleural effusions and bibasilar atelectasis. IMPRESSION: Cardiomegaly with increased pulmonary vascular congestion. New small bilateral pleural effusions and bibasilar atelectasis. No evidence of pulmonary consolidation. Electronically Signed   By: Earle Gell M.D.   On: 02/08/2016 19:08     Scheduled Meds: . acidophilus  1 capsule Oral Daily  . Chlorhexidine Gluconate Cloth  6 each Topical Q0600  . enalapril  2.5 mg Oral Daily  . feeding supplement (ENSURE ENLIVE)  237 mL Oral BID BM  . fluticasone  1 spray Each Nare Daily  . furosemide  20 mg Intravenous BID  . metoprolol succinate  100 mg Oral Daily  . mupirocin ointment  1 application Nasal BID  . potassium chloride  20 mEq Oral Daily  . pravastatin  20 mg Oral QHS  . predniSONE  2.5 mg Oral Q breakfast  . sodium chloride flush  3 mL Intravenous Q12H  . verapamil  120 mg Oral Q12H  . warfarin  7.5 mg Oral ONCE-1800  . Warfarin - Pharmacist Dosing Inpatient   Does not apply q1800   Continuous Infusions:   LOS: 1 day    Chipper Oman, MD [facility] Pager [phone]  If 7PM-7AM, please contact night-coverage [email or web address] Password [facility] 02/10/2016, 4:39 PM

## 2016-02-10 NOTE — Progress Notes (Signed)
ANTICOAGULATION CONSULT NOTE - Follow Up Consult  Pharmacy Consult for warfarin Indication: atrial fibrillation  Allergies  Allergen Reactions  . Arthrotec [Diclofenac-Misoprostol] Diarrhea  . Erythromycin Anaphylaxis       . Morphine Sulfate Other (See Comments)    Reaction:  Hallucinations   . Amoxicillin Rash and Other (See Comments)    Has patient had a PCN reaction causing immediate rash, facial/tongue/throat swelling, SOB or lightheadedness with hypotension: Yes Has patient had a PCN reaction causing severe rash involving mucus membranes or skin necrosis: Yes Has patient had a PCN reaction that required hospitalization No Has patient had a PCN reaction occurring within the last 10 years: No If all of the above answers are "NO", then may proceed with Cephalosporin use.  . Diltiazem Hcl Swelling, Rash and Other (See Comments)    Pt is able to tolerate Verapamil.    . Gold-Containing Drug Products Rash  . Macrodantin [Nitrofurantoin Macrocrystal] Rash  . Naproxen Rash  . Penicillins Rash and Other (See Comments)    ++ tolerates cefepime and ceftriaxone ++ Has patient had a PCN reaction causing immediate rash, facial/tongue/throat swelling, SOB or lightheadedness with hypotension: Yes Has patient had a PCN reaction causing severe rash involving mucus membranes or skin necrosis: Yes Has patient had a PCN reaction that required hospitalization No Has patient had a PCN reaction occurring within the last 10 years: No If all of the above answers are "NO", then may proceed with Cephalosporin use.     Patient Measurements: Height: 5\' 3"  (160 cm) Weight: 134 lb 11.2 oz (61.1 kg) IBW/kg (Calculated) : 52.4   Vital Signs: Temp: 98.8 F (37.1 C) (01/25 1125) Temp Source: Oral (01/25 1125) BP: 88/65 (01/25 1125) Pulse Rate: 121 (01/25 1125)  Labs:  Recent Labs  02/08/16 1950 02/09/16 0005 02/09/16 0006 02/09/16 0542 02/09/16 0850 02/09/16 1428 02/10/16 0529  HGB 11.1*   --   --   --   --   --  13.0  HCT 34.8*  --   --   --   --   --  39.4  PLT 197  --   --   --   --   --  279  LABPROT  --   --  19.4* 18.9*  --   --  21.9*  INR  --   --  1.62 1.57  --   --  1.88  CREATININE 0.71  --   --   --  0.70  --  0.68  TROPONINI  --  <0.03  --   --  <0.03 <0.03  --     Estimated Creatinine Clearance: 51 mL/min (by C-G formula based on SCr of 0.68 mg/dL).    Assessment: 76 y.o.femalewith medical history significant of rosacea, allergic rhinitis, chronic atrial fibrillation on warfarin, chronic bronchitis, GERD, frequent epistaxis due to warfarin use, hyperlipidemia, hypertension, osteopenia, multiple episodes of pneumonia, prolonged QT interval history, pulmonary hypertension rheumatoid arthritis presented to the emergency department due to progressively worse shortness of breathfor 1 day PTA. Patient admitted for CHF exacerbation and started on IV Lasix.   Pharmacy consulted to dose warfarin.  Home dose 5mg  daily  02/10/2016 INR 1.88 H/H WNL Eating >90% or meals DI: none noted  Goal of Therapy:  INR 2-3   Plan:  Per MD request will give warfarin 7.5mg  x 1 tonight Daily INR  Dolly Rias RPh 02/10/2016, 12:20 PM Pager [phone]

## 2016-02-11 ENCOUNTER — Inpatient Hospital Stay (HOSPITAL_COMMUNITY): Payer: Medicare Other

## 2016-02-11 DIAGNOSIS — E785 Hyperlipidemia, unspecified: Secondary | ICD-10-CM

## 2016-02-11 LAB — CBC
HEMATOCRIT: 37.6 % (ref 36.0–46.0)
HEMOGLOBIN: 12.3 g/dL (ref 12.0–15.0)
MCH: 28.9 pg (ref 26.0–34.0)
MCHC: 32.7 g/dL (ref 30.0–36.0)
MCV: 88.5 fL (ref 78.0–100.0)
Platelets: 322 10*3/uL (ref 150–400)
RBC: 4.25 MIL/uL (ref 3.87–5.11)
RDW: 17.5 % — ABNORMAL HIGH (ref 11.5–15.5)
WBC: 9.5 10*3/uL (ref 4.0–10.5)

## 2016-02-11 LAB — BASIC METABOLIC PANEL
ANION GAP: 8 (ref 5–15)
BUN: 25 mg/dL — ABNORMAL HIGH (ref 6–20)
CALCIUM: 9 mg/dL (ref 8.9–10.3)
CHLORIDE: 97 mmol/L — AB (ref 101–111)
CO2: 29 mmol/L (ref 22–32)
Creatinine, Ser: 0.88 mg/dL (ref 0.44–1.00)
GFR calc Af Amer: >60 mL/min (ref >60)
GFR calc non Af Amer: >60 mL/min (ref >60)
GLUCOSE: 104 mg/dL — AB (ref 65–99)
Potassium: 3.7 mmol/L (ref 3.5–5.1)
Sodium: 134 mmol/L — ABNORMAL LOW (ref 135–145)

## 2016-02-11 LAB — PROTIME-INR
INR: 2.4
Prothrombin Time: 26.6 seconds — ABNORMAL HIGH (ref 11.4–15.2)

## 2016-02-11 MED ORDER — WARFARIN SODIUM 5 MG PO TABS
5.0000 mg | ORAL_TABLET | Freq: Once | ORAL | Status: AC
  Administered 2016-02-11: 5 mg via ORAL
  Filled 2016-02-11: qty 1

## 2016-02-11 MED ORDER — FERROUS SULFATE 325 (65 FE) MG PO TABS: 325.0000 mg | ORAL_TABLET | Freq: Two times a day (BID) | ORAL | 0 refills | Status: DC

## 2016-02-11 MED ORDER — ENALAPRIL MALEATE 2.5 MG PO TABS: 2.5000 mg | ORAL_TABLET | Freq: Every day | ORAL | 0 refills | Status: DC

## 2016-02-11 NOTE — Progress Notes (Signed)
ANTICOAGULATION CONSULT NOTE - Follow Up Consult  Pharmacy Consult for warfarin Indication: atrial fibrillation  Allergies  Allergen Reactions  . Arthrotec [Diclofenac-Misoprostol] Diarrhea  . Erythromycin Anaphylaxis       . Morphine Sulfate Other (See Comments)    Reaction:  Hallucinations   . Amoxicillin Rash and Other (See Comments)    Has patient had a PCN reaction causing immediate rash, facial/tongue/throat swelling, SOB or lightheadedness with hypotension: Yes Has patient had a PCN reaction causing severe rash involving mucus membranes or skin necrosis: Yes Has patient had a PCN reaction that required hospitalization No Has patient had a PCN reaction occurring within the last 10 years: No If all of the above answers are "NO", then may proceed with Cephalosporin use.  . Diltiazem Hcl Swelling, Rash and Other (See Comments)    Pt is able to tolerate Verapamil.    . Gold-Containing Drug Products Rash  . Macrodantin [Nitrofurantoin Macrocrystal] Rash  . Naproxen Rash  . Penicillins Rash and Other (See Comments)    ++ tolerates cefepime and ceftriaxone ++ Has patient had a PCN reaction causing immediate rash, facial/tongue/throat swelling, SOB or lightheadedness with hypotension: Yes Has patient had a PCN reaction causing severe rash involving mucus membranes or skin necrosis: Yes Has patient had a PCN reaction that required hospitalization No Has patient had a PCN reaction occurring within the last 10 years: No If all of the above answers are "NO", then may proceed with Cephalosporin use.     Patient Measurements: Height: 5\' 3"  (160 cm) Weight: 134 lb 11.2 oz (61.1 kg) IBW/kg (Calculated) : 52.4   Vital Signs: Temp: 99 F (37.2 C) (01/26 0643) Temp Source: Oral (01/26 0643) BP: 112/60 (01/26 0643) Pulse Rate: 108 (01/26 0643)  Labs:  Recent Labs  02/08/16 1950 02/09/16 0005  02/09/16 0542 02/09/16 0850 02/09/16 1428 02/10/16 0529 02/11/16 0539  HGB 11.1*   --   --   --   --   --  13.0 12.3  HCT 34.8*  --   --   --   --   --  39.4 37.6  PLT 197  --   --   --   --   --  279 322  LABPROT  --   --   < > 18.9*  --   --  21.9* 26.6*  INR  --   --   < > 1.57  --   --  1.88 2.40  CREATININE 0.71  --   --   --  0.70  --  0.68 0.88  TROPONINI  --  <0.03  --   --  <0.03 <0.03  --   --   < > = values in this interval not displayed.  Estimated Creatinine Clearance: 46.4 mL/min (by C-G formula based on SCr of 0.88 mg/dL).    Assessment: 76 y.o.femalewith medical history significant of rosacea, allergic rhinitis, chronic atrial fibrillation on warfarin, chronic bronchitis, GERD, frequent epistaxis due to warfarin use, hyperlipidemia, hypertension, osteopenia, multiple episodes of pneumonia, prolonged QT interval history, pulmonary hypertension rheumatoid arthritis presented to the emergency department due to progressively worse shortness of breathfor 1 day PTA. Patient admitted for CHF exacerbation and started on IV Lasix.   Pharmacy consulted to dose warfarin.  Home dose 5mg  daily  02/11/2016 INR 2.4, therapeutic H/H WNL Eating >90% or meals DI: none noted  Goal of Therapy:  INR 2-3   Plan:  warfarin 5mg  x 1 tonight Daily INR  [place]  02/11/2016, 12:01 PM Pager [phone]

## 2016-02-11 NOTE — Progress Notes (Signed)
Spoke with pt concerning discharge needs. Pt selected [facility] for HHRN/PT will need orders and face to face please.

## 2016-02-11 NOTE — Progress Notes (Signed)
Modified Barium Swallow Progress Note  Patient Details  Name: [name] MRN: [id number] Date of Birth: 1940-07-13  Today's Date: 02/11/2016  Modified Barium Swallow completed.  Full report located under Chart Review in the Imaging Section.  Brief recommendations include the following:  Clinical Impression  Pts swallow function found to be WNL. No weakness, penetration or aspiration seen. Swallow timely. Pts function may fluctuate given respiratory effort, but pt was quite fatigued at time of exam and feeling short of breath. Recommend pt continue current diet with basic aspiration precautions. No SLP f/u needed will sign off.    Swallow Evaluation Recommendations       SLP Diet Recommendations: Regular solids;Thin liquid   Liquid Administration via: Straw;Cup   Medication Administration: Whole meds with liquid   Supervision: Patient able to self feed       Postural Changes: Seated upright at 90 degrees            Paola Flynt, Katherene Ponto 02/11/2016,3:15 PM

## 2016-02-11 NOTE — Discharge Summary (Signed)
Physician Discharge Summary  [name]  [id number]  DOB: 27-Jun-1940  DOA: 02/08/2016 PCP: Leeroy Cha, MD  Admit date: 02/08/2016 Discharge date: 02/11/2016  Admitted From: Home  Disposition:  Home   Recommendations for Outpatient Follow-up:  1. Follow up with PCP in 1-2 weeks 2. Please obtain BMP/CBC in one week 3. Please follow up on the following pending results:  Home Health: RN   Discharge Condition: Improved  CODE STATUS: FULL  Diet recommendation: Heart Healthy   Brief/Interim Summary: [name] a 76 y.o.femalewith medical history significant of rosacea, allergic rhinitis, chronic atrial fibrillation on warfarin, chronic bronchitis, GERD, frequent epistaxis due to warfarin use, hyperlipidemia, hypertension, osteopenia, multiple episodes of pneumonia, prolonged QT interval history, pulmonary hypertension rheumatoid arthritis presented to the emergency department due to progressively worse shortness of breathfor 1 day PTA. Patient admitted for CHF exacerbation and started on IV Lasix, with good response to diuresis. Patient has significantly improved her breathing is much better. He diuresis ~ 3 L, her weight is down approximately 10 pounds since January 16. All her other chronic conditions remained stable during hospital stay. Patient will be discharged home with Lasix by mouth and follow with PCP.  Subjective: Patient seen and examined report feeling significantly better and breathing is back to baseline. Patient was schedule to get Barium Swallow Eval - so it will be performed prior to discharge PCP to follow-up on results.  Discharge Diagnoses/Hospital Course:  Acute on chronic diastolic CHF (congestive heart failure) BNP > 400, small pleural effusion. Good UOP Diuresis ~ 2L  Down 10 lb from 02/01/16  Last ECHO on 12/2915 - LV EF 45-50% with Diffuse hypokinesis  Initially diuresed with IV Lasix - subsequently switched to PO - Will continue Lasix  20 mg BID  Started on ACE during hospital stay will continue - Enalapril 2.5 mg daily  Continue K dur Low salt diet  Daily weights  Follow up with cardiology in 1-2 weeks    Hypertension - stable  On Toprol, Enalapril and Verapamil - patient also on Lasix   Hyperlipidemia Continue pravastatin.  Rheumatoid arthritis  Stable. Continue prednisone  Resume Methotrexate  Monitor LFTs periodically.   Chronic atrial fibrillation  - HR slight high, although patient not taking her current home dose BB  CHA2DS2-VASc Score of at least 4. Continue Metoprolol 125 mg BID  INR now therapeutic - continue Warfarin 5 mg  Monitor INR goal 2-3  Follow up with PCP   Allergic Rhinitis  Continue Flonase  Follow up with PCP   GERD (gastroesophageal reflux disease) Pepcid 20 mg by mouth twice a day.  Anemia - Iron deficiency  Continue iron supplements  Repeat CBC in 1 week  H/H stable   Discharge Instructions  You were cared for by a hospitalist during your hospital stay. If you have any questions about your discharge medications or the care you received while you were in the hospital after you are discharged, you can call the unit and asked to speak with the hospitalist on call if the hospitalist that took care of you is not available. Once you are discharged, your primary care physician will handle any further medical issues. Please note that NO REFILLS for any discharge medications will be authorized once you are discharged, as it is imperative that you return to your primary care physician (or establish a relationship with a primary care physician if you do not have one) for your aftercare needs so that they can reassess your need  for medications and monitor your lab values.  Discharge Instructions    Call MD for:  difficulty breathing, headache or visual disturbances    Complete by:  As directed    Call MD for:  extreme fatigue    Complete by:  As directed    Call MD for:  hives     Complete by:  As directed    Call MD for:  persistant dizziness or light-headedness    Complete by:  As directed    Call MD for:  persistant nausea and vomiting    Complete by:  As directed    Call MD for:  redness, tenderness, or signs of infection (pain, swelling, redness, odor or green/yellow discharge around incision site)    Complete by:  As directed    Call MD for:  severe uncontrolled pain    Complete by:  As directed    Call MD for:  temperature >100.4    Complete by:  As directed    Diet - low sodium heart healthy    Complete by:  As directed    Discharge instructions    Complete by:  As directed    Increase activity slowly    Complete by:  As directed      Allergies as of 02/11/2016      Reactions   Arthrotec [diclofenac-misoprostol] Diarrhea   Erythromycin Anaphylaxis      Morphine Sulfate Other (See Comments)   Reaction:  Hallucinations    Amoxicillin Rash, Other (See Comments)   Has patient had a PCN reaction causing immediate rash, facial/tongue/throat swelling, SOB or lightheadedness with hypotension: Yes Has patient had a PCN reaction causing severe rash involving mucus membranes or skin necrosis: Yes Has patient had a PCN reaction that required hospitalization No Has patient had a PCN reaction occurring within the last 10 years: No If all of the above answers are "NO", then may proceed with Cephalosporin use.   Diltiazem Hcl Swelling, Rash, Other (See Comments)   Pt is able to tolerate Verapamil.     Gold-containing Drug Products Rash   Macrodantin [nitrofurantoin Macrocrystal] Rash   Naproxen Rash   Penicillins Rash, Other (See Comments)   ++ tolerates cefepime and ceftriaxone ++ Has patient had a PCN reaction causing immediate rash, facial/tongue/throat swelling, SOB or lightheadedness with hypotension: Yes Has patient had a PCN reaction causing severe rash involving mucus membranes or skin necrosis: Yes Has patient had a PCN reaction that required  hospitalization No Has patient had a PCN reaction occurring within the last 10 years: No If all of the above answers are "NO", then may proceed with Cephalosporin use.      Medication List    TAKE these medications   acidophilus Caps capsule Take 1 capsule by mouth daily.   cholecalciferol 1000 units tablet Commonly known as:  VITAMIN D Take 2,000 Units by mouth daily.   enalapril 2.5 MG tablet Commonly known as:  VASOTEC Take 1 tablet (2.5 mg total) by mouth daily. Start taking on:  02/12/2016   ferrous sulfate 325 (65 FE) MG tablet Take 1 tablet (325 mg total) by mouth 2 (two) times daily with a meal.   fluticasone 50 MCG/ACT nasal spray Commonly known as:  FLONASE Place 2 sprays into both nostrils daily as needed for rhinitis.   folic acid 1 MG tablet Commonly known as:  FOLVITE Take 2 mg by mouth daily.   furosemide 40 MG tablet Commonly known as:  LASIX Take 40 mg by  mouth every evening.   guaiFENesin 600 MG 12 hr tablet Commonly known as:  MUCINEX Take 600 mg by mouth 2 (two) times daily as needed for cough or to loosen phlegm.   methotrexate 2.5 MG tablet Commonly known as:  RHEUMATREX Take 5-7.5 mg by mouth 2 (two) times a week. Pt takes three tablets every Tuesday and two tablets every Wednesday.   metoprolol succinate 100 MG 24 hr tablet Commonly known as:  TOPROL-XL Take 1 tablet (100 mg total) by mouth 2 (two) times daily. Take with or immediately following a meal.   metoprolol succinate 25 MG 24 hr tablet Commonly known as:  TOPROL XL Take 1 tablet (25 mg total) by mouth 2 (two) times daily. To take along with metoprolol XL 100mg  for a total of 125 mg BID   multivitamin with minerals tablet Take 1 tablet by mouth daily.   potassium chloride 10 MEQ tablet Commonly known as:  K-DUR,KLOR-CON Take 2 tablets (20 mEq total) by mouth daily.   pravastatin 20 MG tablet Commonly known as:  PRAVACHOL Take 20 mg by mouth at bedtime.   predniSONE 2.5 MG  tablet Commonly known as:  DELTASONE Take 5 mg daily for 1 week; then resume daily dose of 2.5mg  daily   ranitidine 150 MG tablet Commonly known as:  ZANTAC Take 1 tablet (150 mg total) by mouth at bedtime.   verapamil 120 MG tablet Commonly known as:  CALAN Take 1 tablet (120 mg total) by mouth every 12 (twelve) hours.   warfarin 5 MG tablet Commonly known as:  COUMADIN Take 1 tablet (5 mg total) by mouth daily.       Allergies  Allergen Reactions  . Arthrotec [Diclofenac-Misoprostol] Diarrhea  . Erythromycin Anaphylaxis       . Morphine Sulfate Other (See Comments)    Reaction:  Hallucinations   . Amoxicillin Rash and Other (See Comments)    Has patient had a PCN reaction causing immediate rash, facial/tongue/throat swelling, SOB or lightheadedness with hypotension: Yes Has patient had a PCN reaction causing severe rash involving mucus membranes or skin necrosis: Yes Has patient had a PCN reaction that required hospitalization No Has patient had a PCN reaction occurring within the last 10 years: No If all of the above answers are "NO", then may proceed with Cephalosporin use.  . Diltiazem Hcl Swelling, Rash and Other (See Comments)    Pt is able to tolerate Verapamil.    . Gold-Containing Drug Products Rash  . Macrodantin [Nitrofurantoin Macrocrystal] Rash  . Naproxen Rash  . Penicillins Rash and Other (See Comments)    ++ tolerates cefepime and ceftriaxone ++ Has patient had a PCN reaction causing immediate rash, facial/tongue/throat swelling, SOB or lightheadedness with hypotension: Yes Has patient had a PCN reaction causing severe rash involving mucus membranes or skin necrosis: Yes Has patient had a PCN reaction that required hospitalization No Has patient had a PCN reaction occurring within the last 10 years: No If all of the above answers are "NO", then may proceed with Cephalosporin use.     Consultations:  None    Procedures/Studies: Dg Chest 2  View  Result Date: 02/08/2016 CLINICAL DATA:  Worsening shortness of breath for several months. Hypoxia. EXAM: CHEST  2 VIEW COMPARISON:  02/01/2016 FINDINGS: Stable cardiomegaly. Increased pulmonary vascular congestion. No evidence of pulmonary consolidation. Decreased lung volumes seen with new small bilateral pleural effusions and bibasilar atelectasis. IMPRESSION: Cardiomegaly with increased pulmonary vascular congestion. New small bilateral pleural effusions and  bibasilar atelectasis. No evidence of pulmonary consolidation. Electronically Signed   By: Earle Gell M.D.   On: 02/08/2016 19:08   Dg Chest 2 View  Result Date: 02/01/2016 CLINICAL DATA:  F/u on pna, cough, sob, remains, HTN, hx of pulmonary failure x 02/2015, afib, EXAM: CHEST  2 VIEW COMPARISON:  Chest x-rays dated 01/10/2016 and 09/15/2015. FINDINGS: Improved aeration at both bases. Lungs now appear clear. No pleural effusion or pneumothorax seen. Cardiomegaly is stable. No acute or suspicious osseous finding. IMPRESSION: No active cardiopulmonary disease. No evidence of pneumonia on today's follow-up chest x-ray. Stable cardiomegaly. Electronically Signed   By: Franki Cabot M.D.   On: 02/01/2016 15:13    Discharge Exam: Vitals:   02/11/16 0643 02/11/16 1146  BP: 112/60 112/64  Pulse: (!) 108 (!) 118  Resp: 18 18  Temp: 99 F (37.2 C) 98.2 F (36.8 C)   Vitals:   02/10/16 2108 02/10/16 2247 02/11/16 0643 02/11/16 1146  BP: 101/87 116/69 112/60 112/64  Pulse: (!) 115  (!) 108 (!) 118  Resp: 18  18 18   Temp: 99.6 F (37.6 C)  99 F (37.2 C) 98.2 F (36.8 C)  TempSrc: Oral  Oral Oral  SpO2: 96%  96% 97%  Weight:   61.1 kg (134 lb 11.2 oz)   Height:        General: Pt is alert, awake, not in acute distress Cardiovascular: Irr Irr, S1/S2 +, no rubs, no gallops Respiratory: CTA bilaterally, no wheezing, no rhonchi Abdominal: Soft, NT, ND, bowel sounds + Extremities: no edema, no cyanosis   The results of  significant diagnostics from this hospitalization (including imaging, microbiology, ancillary and laboratory) are listed below for reference.     Microbiology: Recent Results (from the past 240 hour(s))  Blood culture (routine x 2)     Status: None (Preliminary result)   Collection Time: 02/08/16  7:50 PM  Result Value Ref Range Status   Specimen Description BLOOD LEFT AC  Final   Special Requests BOTTLES DRAWN AEROBIC AND ANAEROBIC 5CC  Final   Culture   Final    NO GROWTH 3 DAYS Performed at [facility], [address], [place], [place] [address]    Report Status PENDING  Incomplete  Blood culture (routine x 2)     Status: None (Preliminary result)   Collection Time: 02/08/16  7:50 PM  Result Value Ref Range Status   Specimen Description BLOOD RIGHT AC  Final   Special Requests BOTTLES DRAWN AEROBIC AND ANAEROBIC 5CC  Final   Culture   Final    NO GROWTH 3 DAYS Performed at [facility], [place] [address], [place], [place] [address]    Report Status PENDING  Incomplete  MRSA PCR Screening     Status: Abnormal   Collection Time: 02/09/16  2:05 AM  Result Value Ref Range Status   MRSA by PCR POSITIVE (A) NEGATIVE Final    Comment:        The GeneXpert MRSA Assay (FDA approved for NASAL specimens only), is one component of a comprehensive MRSA colonization surveillance program. It is not intended to diagnose MRSA infection nor to guide or monitor treatment for MRSA infections. RESULT CALLED TO, READ BACK BY AND VERIFIED WITH: BLAINE,T ON [id number] AT 0917 BY INGLEE   Respiratory Panel by PCR     Status: None   Collection Time: 02/09/16  7:00 PM  Result Value Ref Range Status   Adenovirus NOT DETECTED NOT DETECTED  Final   Coronavirus 229E NOT DETECTED NOT DETECTED Final   Coronavirus HKU1 NOT DETECTED NOT DETECTED Final   Coronavirus NL63 NOT DETECTED NOT DETECTED Final   Coronavirus OC43 NOT DETECTED NOT DETECTED Final   Metapneumovirus NOT DETECTED NOT  DETECTED Final   Rhinovirus / Enterovirus NOT DETECTED NOT DETECTED Final   Influenza A NOT DETECTED NOT DETECTED Final   Influenza B NOT DETECTED NOT DETECTED Final   Parainfluenza Virus 1 NOT DETECTED NOT DETECTED Final   Parainfluenza Virus 2 NOT DETECTED NOT DETECTED Final   Parainfluenza Virus 3 NOT DETECTED NOT DETECTED Final   Parainfluenza Virus 4 NOT DETECTED NOT DETECTED Final   Respiratory Syncytial Virus NOT DETECTED NOT DETECTED Final   Bordetella pertussis NOT DETECTED NOT DETECTED Final   Chlamydophila pneumoniae NOT DETECTED NOT DETECTED Final   Mycoplasma pneumoniae NOT DETECTED NOT DETECTED Final    Comment: Performed at [facility], [place] [address], [place], [place] [address]     Labs: BNP (last 3 results)  Recent Labs  01/08/16 1257 01/08/16 1848 02/08/16 1950  BNP 120.8* 302.0* [id number]   Basic Metabolic Panel:  Recent Labs Lab 02/08/16 1950 02/09/16 0850 02/10/16 0529 02/11/16 0539  NA 132* 136 134* 134*  K 4.0 3.3* 3.9 3.7  CL 100* 101 100* 97*  CO2 24 25 25 29   GLUCOSE 99 132* 107* 104*  BUN 13 12 15  25*  CREATININE 0.71 0.70 0.68 0.88  CALCIUM 8.8* 8.9 8.9 9.0  MG 2.0  --  2.1  --   PHOS 3.1  --   --   --    Liver Function Tests: No results for input(s): AST, ALT, ALKPHOS, BILITOT, PROT, ALBUMIN in the last 168 hours. No results for input(s): LIPASE, AMYLASE in the last 168 hours. No results for input(s): AMMONIA in the last 168 hours. CBC:  Recent Labs Lab 02/08/16 1950 02/10/16 0529 02/11/16 0539  WBC 9.4 10.3 9.5  NEUTROABS 7.1  --   --   HGB 11.1* 13.0 12.3  HCT 34.8* 39.4 37.6  MCV 90.4 88.9 88.5  PLT 197 279 322   Cardiac Enzymes:  Recent Labs Lab 02/09/16 0005 02/09/16 0850 02/09/16 1428  TROPONINI <0.03 <0.03 <0.03   BNP: Invalid input(s): POCBNP CBG: No results for input(s): GLUCAP in the last 168 hours. D-Dimer No results for input(s): DDIMER in the last 72 hours. Hgb A1c No results for input(s):  HGBA1C in the last 72 hours. Lipid Profile No results for input(s): CHOL, HDL, LDLCALC, TRIG, CHOLHDL, LDLDIRECT in the last 72 hours. Thyroid function studies No results for input(s): TSH, T4TOTAL, T3FREE, THYROIDAB in the last 72 hours.  Invalid input(s): FREET3 Anemia work up  Recent Labs  02/10/16 0529  VITAMINB12 620  FOLATE 18.0  FERRITIN 240  TIBC 297  IRON 11*  RETICCTPCT 1.1   Urinalysis    Component Value Date/Time   COLORURINE AMBER (A) 01/08/2016 1507   APPEARANCEUR TURBID (A) 01/08/2016 1507   LABSPEC 1.014 01/08/2016 1507   PHURINE 8.0 01/08/2016 1507   GLUCOSEU NEGATIVE 01/08/2016 1507   HGBUR NEGATIVE 01/08/2016 1507   BILIRUBINUR NEGATIVE 01/08/2016 1507   KETONESUR NEGATIVE 01/08/2016 1507   PROTEINUR 100 (A) 01/08/2016 1507   UROBILINOGEN 0.2 10/29/2013 1932   NITRITE NEGATIVE 01/08/2016 1507   LEUKOCYTESUR NEGATIVE 01/08/2016 1507   Sepsis Labs Invalid input(s): PROCALCITONIN,  WBC,  LACTICIDVEN Microbiology Recent Results (from the past 240 hour(s))  Blood culture (routine x 2)  Status: None (Preliminary result)   Collection Time: 02/08/16  7:50 PM  Result Value Ref Range Status   Specimen Description BLOOD LEFT AC  Final   Special Requests BOTTLES DRAWN AEROBIC AND ANAEROBIC 5CC  Final   Culture   Final    NO GROWTH 3 DAYS Performed at [facility], [place] [address] [place], [place] [address]    Report Status PENDING  Incomplete  Blood culture (routine x 2)     Status: None (Preliminary result)   Collection Time: 02/08/16  7:50 PM  Result Value Ref Range Status   Specimen Description BLOOD RIGHT AC  Final   Special Requests BOTTLES DRAWN AEROBIC AND ANAEROBIC 5CC  Final   Culture   Final    NO GROWTH 3 DAYS Performed at [facility], [place] [address], [place],  [place] [address]    Report Status PENDING  Incomplete  MRSA PCR Screening     Status: Abnormal   Collection Time: 02/09/16  2:05 AM  Result Value Ref Range Status    MRSA by PCR POSITIVE (A) NEGATIVE Final    Comment:        The GeneXpert MRSA Assay (FDA approved for NASAL specimens only), is one component of a comprehensive MRSA colonization surveillance program. It is not intended to diagnose MRSA infection nor to guide or monitor treatment for MRSA infections. RESULT CALLED TO, READ BACK BY AND VERIFIED WITH: BLAINE,T ON [id number] AT 0917 BY INGLEE   Respiratory Panel by PCR     Status: None   Collection Time: 02/09/16  7:00 PM  Result Value Ref Range Status   Adenovirus NOT DETECTED NOT DETECTED Final   Coronavirus 229E NOT DETECTED NOT DETECTED Final   Coronavirus HKU1 NOT DETECTED NOT DETECTED Final   Coronavirus NL63 NOT DETECTED NOT DETECTED Final   Coronavirus OC43 NOT DETECTED NOT DETECTED Final   Metapneumovirus NOT DETECTED NOT DETECTED Final   Rhinovirus / Enterovirus NOT DETECTED NOT DETECTED Final   Influenza A NOT DETECTED NOT DETECTED Final   Influenza B NOT DETECTED NOT DETECTED Final   Parainfluenza Virus 1 NOT DETECTED NOT DETECTED Final   Parainfluenza Virus 2 NOT DETECTED NOT DETECTED Final   Parainfluenza Virus 3 NOT DETECTED NOT DETECTED Final   Parainfluenza Virus 4 NOT DETECTED NOT DETECTED Final   Respiratory Syncytial Virus NOT DETECTED NOT DETECTED Final   Bordetella pertussis NOT DETECTED NOT DETECTED Final   Chlamydophila pneumoniae NOT DETECTED NOT DETECTED Final   Mycoplasma pneumoniae NOT DETECTED NOT DETECTED Final    Comment: Performed at [facility], [place]. [address], [place], [place] [address]    Time coordinating discharge: Over 30 minutes  SIGNED:  Chipper Oman, MD  [facility] 02/11/2016, 3:25 PM Pager   If 7PM-7AM, please contact night-coverage [email or web address] Password [email or web address]

## 2016-02-13 LAB — CULTURE, BLOOD (ROUTINE X 2)
CULTURE: NO GROWTH
Culture: NO GROWTH

## 2016-02-15 ENCOUNTER — Other Ambulatory Visit: Payer: Self-pay | Admitting: *Deleted

## 2016-02-15 ENCOUNTER — Other Ambulatory Visit: Payer: Self-pay

## 2016-02-15 ENCOUNTER — Other Ambulatory Visit: Payer: Self-pay | Admitting: Interventional Cardiology

## 2016-02-15 ENCOUNTER — Ambulatory Visit (HOSPITAL_COMMUNITY): Payer: Medicare Other | Attending: Interventional Cardiology

## 2016-02-15 DIAGNOSIS — I3139 Other pericardial effusion (noninflammatory): Secondary | ICD-10-CM

## 2016-02-15 DIAGNOSIS — I313 Pericardial effusion (noninflammatory): Secondary | ICD-10-CM | POA: Diagnosis present

## 2016-02-15 MED ORDER — METOPROLOL SUCCINATE ER 25 MG PO TB24: 25.0000 mg | ORAL_TABLET | Freq: Two times a day (BID) | ORAL | 3 refills | Status: DC

## 2016-02-15 MED ORDER — METOPROLOL SUCCINATE ER 100 MG PO TB24: 100.0000 mg | ORAL_TABLET | Freq: Two times a day (BID) | ORAL | 3 refills | Status: DC

## 2016-02-17 NOTE — Progress Notes (Signed)
Cardiology Office Note    Date:  02/22/2016   ID:  Nicole, [name] 10/15/1940, MRN [id number]  PCP:  Leeroy Cha, MD  Cardiologist:  Dr. Irish Lack  Chief Complaint: Hospital follow up for CHF  History of Present Illness:   [name] is a 76 y.o. female chronic atrial fibrillation (on Coumadin), HTN, HLD, RA and recent pericardial effusion (s/p pericardiocentesis and drain placement on 12/16/2015) and multiple admission afterwards presents for follow up.   She was recently admitted from 11/29 - 12/19/2015 for worsening dyspnea with CT imaging showing a large pericardial effusion. She underwent a pericardiocentesis with drain placement on 12/16/2015 with removal of 548mL of dark sanguinous fluid. Given her fluid was bloody in color, it was recommended to hold Coumadin for at least 2-4 weeks with plans to recheck a limited echo at that time prior to resuming Coumadin.   Admitted 12/27/15 to 01/02/16 for sepsis 2nd to c.diff. Cardiology show her due to afib rvr. Increased BB and CCB. Continue to hold coumadin.   Admitted again 01/08/16 to 01/06/16 for sepsis 2nd to HCAP. Also found to have new splenic and right renal infracts (in setting of afib while off coumadin). Echo 12/24 showed resolution of effusion. Resumed anticoagulation.  Echo on 12/29 with trivial effusion  LVEF 50%.   Admitted 02/08/16-02/11/16 for acute on chronic diastolic CHF with ~2 L diuresis and 10lb weight loss on IM service.   Limited echo 02/15/16 showed no recurrent pericardial effusion with normal LV and valvular function.   Here today for follow up. CBC and BMET are normal post discharge. The patient denies nausea, vomiting, fever, chest pain, palpitations, shortness of breath, orthopnea, PND, dizziness, syncope, cough, congestion, abdominal pain, hematochezia, melena, lower extremity edema. Feels "worn out after waking from parking lot to clinic for visit". No significant dyspnea. Lack of energy.    Past Medical History:  Diagnosis Date  . Acne rosacea   . Adhesive capsulitis of left shoulder 1980  . Allergic rhinitis   . Chronic atrial fibrillation ([place])    a. Coumadin anticoagulation - [facility] ([place])  . Chronic bronchitis ([place])   . Esophageal reflux   . Frequent epistaxis    "cause of my coumadin" (12/16/2015)  . Heart murmur   . History of echocardiogram    a. Echo 4/17:  EF 55-60%, no RWMA, mild MR, mild LAE, PASP 48 mmHg, trivial effusion post to heart  . Hypercholesterolemia   . Hyperlipidemia   . Hypertension   . Osteopenia   . Pleural effusion associated with pulmonary infection    Admx 5/17 >> required L thoracentesis (cytology neg for malignancy)   . Pneumonia    "I've had it 4 times this year" (12/16/2015)  . Polymorphic light eruption 2004  . Prolonged QT interval   . Pulmonary HTN   . Respiratory failure ([place]) 04/2015   "spent 3 days; went home; then another 13 days in hospital" (12/16/2015  . Rheumatoid arthritis ([place]) 1975  . Right middle ear infection 1986  . Skin cancer    "burndt it off at the right side of my nose/face" (12/16/2015)    Past Surgical History:  Procedure Laterality Date  . BREAST BIOPSY Left 2000s X 2  . CARDIAC CATHETERIZATION  03/2010   a. [place] 2/09: no ischemia; low risk  //  b. [place] 3/12: normal coronary arteries  . CARDIAC CATHETERIZATION N/A 12/16/2015   Procedure: Pericardiocentesis;  Surgeon: Nelva Bush, MD;  Location: [facility]  [facility];  Service: Cardiovascular;  Laterality: N/A;  . CARDIOVERSION  04/2010  . TEMPOROMANDIBULAR JOINT SURGERY Bilateral   . TOTAL ABDOMINAL HYSTERECTOMY      Current Medications: Prior to Admission medications   Medication Sig Start Date End Date Taking? Authorizing Provider  acidophilus (RISAQUAD) CAPS capsule Take 1 capsule by mouth daily.    Historical Provider, MD  cholecalciferol (VITAMIN D) 1000 UNITS tablet Take 2,000 Units by mouth daily.    Historical Provider,  MD  enalapril (VASOTEC) 2.5 MG tablet Take 1 tablet (2.5 mg total) by mouth daily. 02/12/16   Doreatha Lew, MD  ferrous sulfate 325 (65 FE) MG tablet Take 1 tablet (325 mg total) by mouth 2 (two) times daily with a meal. 02/11/16   Doreatha Lew, MD  fluticasone [facility]) 50 MCG/ACT nasal spray Place 2 sprays into both nostrils daily as needed for rhinitis.     Historical Provider, MD  folic acid (FOLVITE) 1 MG tablet Take 2 mg by mouth daily.     Historical Provider, MD  furosemide (LASIX) 40 MG tablet Take 40 mg by mouth every evening.    Historical Provider, MD  guaiFENesin (MUCINEX) 600 MG 12 hr tablet Take 600 mg by mouth 2 (two) times daily as needed for cough or to loosen phlegm.     Historical Provider, MD  methotrexate (RHEUMATREX) 2.5 MG tablet Take 5-7.5 mg by mouth 2 (two) times a week. Pt takes three tablets every Tuesday and two tablets every Wednesday.    Historical Provider, MD  metoprolol succinate (TOPROL XL) 25 MG 24 hr tablet Take 1 tablet (25 mg total) by mouth 2 (two) times daily. To take along with metoprolol XL 100mg  for a total of 125 mg BID 02/15/16   Jettie Booze, MD  metoprolol succinate (TOPROL-XL) 100 MG 24 hr tablet Take 1 tablet (100 mg total) by mouth 2 (two) times daily. Take with or immediately following a meal. 02/15/16   Jettie Booze, MD  Multiple Vitamins-Minerals (MULTIVITAMIN WITH MINERALS) tablet Take 1 tablet by mouth daily.    Historical Provider, MD  potassium chloride (K-DUR,KLOR-CON) 10 MEQ tablet Take 2 tablets (20 mEq total) by mouth daily. 01/16/16   Barton Dubois, MD  pravastatin (PRAVACHOL) 20 MG tablet Take 20 mg by mouth at bedtime.    Historical Provider, MD  predniSONE (DELTASONE) 2.5 MG tablet Take 5 mg daily for 1 week; then resume daily dose of 2.5mg  daily 01/16/16   Barton Dubois, MD  ranitidine (ZANTAC) 150 MG tablet Take 1 tablet (150 mg total) by mouth at bedtime. 12/31/15   Florencia Reasons, MD  verapamil (CALAN) 120 MG tablet  Take 1 tablet (120 mg total) by mouth every 12 (twelve) hours. 01/02/16   Florencia Reasons, MD  warfarin (COUMADIN) 5 MG tablet Take 1 tablet (5 mg total) by mouth daily. 01/04/16   Jettie Booze, MD    Allergies:   Arthrotec [diclofenac-misoprostol]; Erythromycin; Morphine sulfate; Amoxicillin; Diltiazem hcl; Gold-containing drug products; Macrodantin [nitrofurantoin macrocrystal]; Naproxen; and Penicillins   Social History   Social History  . Marital status: Married    Spouse name: N/A  . Number of children: N/A  . Years of education: N/A   Social History Main Topics  . Smoking status: Never Smoker  . Smokeless tobacco: Never Used  . Alcohol use No  . Drug use: No  . Sexual activity: No   Other Topics Concern  . None   Social History Narrative  .  None     Family History:  The patient's family history includes Healthy in her sister; Heart disease in her father, mother, sister, and sister; Other in her mother.   ROS:   Please see the history of present illness.    ROS All other systems reviewed and are negative.   PHYSICAL EXAM:   VS:  BP 118/60   Pulse 98   Ht 5\' 3"  (1.6 m)   Wt 142 lb 6.4 oz (64.6 kg)   SpO2 96%   BMI 25.23 kg/m    GEN: Well nourished, well developed, in no acute distress  HEENT: normal  Neck: no JVD, carotid bruits, or masses Cardiac: Ir Ir ; no murmurs, rubs, or gallops,no edema  Respiratory:  clear to auscultation bilaterally, normal work of breathing GI: soft, nontender, nondistended, + BS MS: no deformity or atrophy  Skin: warm and dry, no rash Neuro:  Alert and Oriented x 3, Strength and sensation are intact Psych: euthymic mood, full affect  Wt Readings from Last 3 Encounters:  02/22/16 142 lb 6.4 oz (64.6 kg)  02/11/16 134 lb 11.2 oz (61.1 kg)  02/01/16 144 lb 6.4 oz (65.5 kg)      Studies/Labs Reviewed:   EKG:  EKG is not ordered today.   Recent Labs: 12/13/2015: Pro B Natriuretic peptide (BNP) 215.0 01/08/2016: TSH  1.999 01/11/2016: ALT 22 02/08/2016: B Natriuretic Peptide 415.9 02/10/2016: Magnesium 2.1 02/11/2016: BUN 25; Creatinine, Ser 0.88; Hemoglobin 12.3; Platelets 322; Potassium 3.7; Sodium 134   Lipid Panel    Component Value Date/Time   CHOL 134 05/24/2015 1344   TRIG 148.0 05/21/2013 0846   HDL 36.60 (L) 05/21/2013 0846   CHOLHDL 4 05/21/2013 0846   VLDL 29.6 05/21/2013 0846   LDLCALC 79 05/21/2013 0846    Additional studies/ records that were reviewed today include:   Echocardiogram: 02/15/16 LV EF: 55% -   60%  ------------------------------------------------------------------- Indications:      I31.3 Pericardial Effusion.  LIMITED ECHO FOR PERICARDIAL EFFUSION.  ------------------------------------------------------------------- History:   PMH:  Acquired from the patient and from the patient&'s chart.  PMH:  Pulmonary hypertension. Pneumonia. Murmur. Chronic Atrial Fibrillation. Pleural Effusion. Respiratory Failure.  Risk factors:  Hypertension.  Hypercholesterolemia.  ------------------------------------------------------------------- Study Conclusions  - Left ventricle: The cavity size was normal. Systolic function was   normal. The estimated ejection fraction was in the range of 55%   to 60%. Wall motion was normal; there were no regional wall   motion abnormalities. - Aortic valve: Transvalvular velocity was within the normal range.   There was no stenosis. There was no regurgitation. - Mitral valve: Transvalvular velocity was within the normal range.   There was no evidence for stenosis. There was mild regurgitation. - Right ventricle: The cavity size was normal. Wall thickness was   normal. Systolic function was normal. - Tricuspid valve: There was mild regurgitation. - Pulmonary arteries: Systolic pressure was within the normal   range. PA peak pressure: 23 mm Hg (S).   ASSESSMENT & PLAN:    1.  Chronic Atrial Fibrillation:  - Rate stable.  Cont ?  blocker and verapamil.  Anticoagulated w/ coumadin and INR Rx.  2.  Chronic diastolic CHF: - Normal EF on echo. Wt stable 138 lbs at home.   Euvolemic on exam. Continue current medications.   4.  Pericardial Effusion:  - Limited echo 02/15/16 showed no recurrent pericardial effusion.   5.  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  Coumadin now resumed and INR Rx.  F/u with Dr. Irish Lack in 3 months.    Medication Adjustments/Labs and Tests Ordered: Current medicines are reviewed at length with the patient today.  Concerns regarding medicines are outlined above.  Medication changes, Labs and Tests ordered today are listed in the Patient Instructions below. There are no Patient Instructions on file for this visit.   Jarrett Soho, [place]  02/22/2016 1:52 PM    [facility] [address], [place], [place]  [address] Phone: [phone]; Fax: [phone]

## 2016-02-21 ENCOUNTER — Ambulatory Visit (INDEPENDENT_AMBULATORY_CARE_PROVIDER_SITE_OTHER): Payer: Medicare Other | Admitting: Pharmacist

## 2016-02-21 DIAGNOSIS — Z5181 Encounter for therapeutic drug level monitoring: Secondary | ICD-10-CM

## 2016-02-21 LAB — POCT INR: INR: 2.3

## 2016-02-22 ENCOUNTER — Encounter (INDEPENDENT_AMBULATORY_CARE_PROVIDER_SITE_OTHER): Payer: Self-pay

## 2016-02-22 ENCOUNTER — Ambulatory Visit: Payer: Medicare Other | Admitting: Adult Health

## 2016-02-22 ENCOUNTER — Ambulatory Visit (INDEPENDENT_AMBULATORY_CARE_PROVIDER_SITE_OTHER): Payer: Medicare Other | Admitting: Physician Assistant

## 2016-02-22 ENCOUNTER — Encounter: Payer: Self-pay | Admitting: Physician Assistant

## 2016-02-22 ENCOUNTER — Encounter: Payer: Self-pay | Admitting: Adult Health

## 2016-02-22 ENCOUNTER — Ambulatory Visit (INDEPENDENT_AMBULATORY_CARE_PROVIDER_SITE_OTHER): Payer: Medicare Other | Admitting: Adult Health

## 2016-02-22 ENCOUNTER — Ambulatory Visit (INDEPENDENT_AMBULATORY_CARE_PROVIDER_SITE_OTHER)
Admission: RE | Admit: 2016-02-22 | Discharge: 2016-02-22 | Disposition: A | Discharge status: Home / Self Care | Payer: Medicare Other | Source: Ambulatory Visit | Attending: Adult Health | Admitting: Adult Health

## 2016-02-22 VITALS — BP 118/60 | HR 98 | Ht 63.0 in | Wt 142.4 lb

## 2016-02-22 VITALS — BP 112/64 | HR 63 | Ht 63.0 in | Wt 141.4 lb

## 2016-02-22 DIAGNOSIS — Z7901 Long term (current) use of anticoagulants: Secondary | ICD-10-CM | POA: Diagnosis not present

## 2016-02-22 DIAGNOSIS — I5033 Acute on chronic diastolic (congestive) heart failure: Secondary | ICD-10-CM

## 2016-02-22 DIAGNOSIS — J189 Pneumonia, unspecified organism: Secondary | ICD-10-CM

## 2016-02-22 DIAGNOSIS — I10 Essential (primary) hypertension: Secondary | ICD-10-CM | POA: Diagnosis not present

## 2016-02-22 DIAGNOSIS — I5032 Chronic diastolic (congestive) heart failure: Secondary | ICD-10-CM

## 2016-02-22 DIAGNOSIS — I313 Pericardial effusion (noninflammatory): Secondary | ICD-10-CM

## 2016-02-22 DIAGNOSIS — I481 Persistent atrial fibrillation: Secondary | ICD-10-CM

## 2016-02-22 DIAGNOSIS — I4819 Other persistent atrial fibrillation: Secondary | ICD-10-CM

## 2016-02-22 DIAGNOSIS — I3139 Other pericardial effusion (noninflammatory): Secondary | ICD-10-CM

## 2016-02-22 NOTE — Patient Instructions (Signed)
Continue on current regimen  Follow up with Dr. Lake Bells in 3 weeks and As needed

## 2016-02-22 NOTE — Progress Notes (Signed)
@Patient  ID: [name], female    DOB: Apr 09, 1940, 76 y.o.   MRN: [id number]  Chief Complaint  Patient presents with  . Follow-up    PNA     Referring provider: Leeroy Cha,*  HPI: 76 yo female followed for recurrent PNA . Seen initially in 05/2015 for parapneumonic effusion.requiring multiple thoracentesis (cx /cytology neg ) .  Complicated hospitalization w/ recurrent admission Nov/Dec 2017 with Sepsis , pericardial effusion s/p pericardiacenteiss , drain, HCAP, spenic/renal infarcts,  Hx of RA on MTX.   02/24/2016 Follow up ; Recurrent PNA  Recently admitted 2 weeks ago for decompensated CHF . She was diuresis with 10 lb wt decrease, . started on ACE . She has been battling recurrent PNA with critical illness in Nov/Dec with HCAP , pericardial effusion and sepsis .  On Methotrexate  And  Prednisone 5mg  daily  She is on Ebrel weekly but only 2 doses in last 2 months. Last dose was 1 week ago.  Says she is starting to feel some better but is still very weak.  No fever, increased edema or hemoptyiss .   Allergies  Allergen Reactions  . Arthrotec [Diclofenac-Misoprostol] Diarrhea  . Erythromycin Anaphylaxis       . Morphine Sulfate Other (See Comments)    Reaction:  Hallucinations   . Amoxicillin Rash and Other (See Comments)    Has patient had a PCN reaction causing immediate rash, facial/tongue/throat swelling, SOB or lightheadedness with hypotension: Yes Has patient had a PCN reaction causing severe rash involving mucus membranes or skin necrosis: Yes Has patient had a PCN reaction that required hospitalization No Has patient had a PCN reaction occurring within the last 10 years: No If all of the above answers are "NO", then may proceed with Cephalosporin use.  . Diltiazem Hcl Swelling, Rash and Other (See Comments)    Pt is able to tolerate Verapamil.    . Gold-Containing Drug Products Rash  . Macrodantin [Nitrofurantoin Macrocrystal] Rash  . Naproxen  Rash  . Penicillins Rash and Other (See Comments)    ++ tolerates cefepime and ceftriaxone ++ Has patient had a PCN reaction causing immediate rash, facial/tongue/throat swelling, SOB or lightheadedness with hypotension: Yes Has patient had a PCN reaction causing severe rash involving mucus membranes or skin necrosis: Yes Has patient had a PCN reaction that required hospitalization No Has patient had a PCN reaction occurring within the last 10 years: No If all of the above answers are "NO", then may proceed with Cephalosporin use.     Immunization History  Administered Date(s) Administered  . Influenza,inj,Quad PF,36+ Mos 10/14/2014, 09/15/2015    Past Medical History:  Diagnosis Date  . Acne rosacea   . Adhesive capsulitis of left shoulder 1980  . Allergic rhinitis   . Chronic atrial fibrillation ([place])    a. Coumadin anticoagulation - [facility] ([place])  . Chronic bronchitis ([place])   . Esophageal reflux   . Frequent epistaxis    "cause of my coumadin" (12/16/2015)  . Heart murmur   . History of echocardiogram    a. Echo 4/17:  EF 55-60%, no RWMA, mild MR, mild LAE, PASP 48 mmHg, trivial effusion post to heart  . Hypercholesterolemia   . Hyperlipidemia   . Hypertension   . Osteopenia   . Pleural effusion associated with pulmonary infection    Admx 5/17 >> required L thoracentesis (cytology neg for malignancy)   . Pneumonia    "I've had it 4 times this year" (12/16/2015)  .  Polymorphic light eruption 2004  . Prolonged QT interval   . Pulmonary HTN   . Respiratory failure ([place]) 04/2015   "spent 3 days; went home; then another 13 days in hospital" (12/16/2015  . Rheumatoid arthritis ([place]) 1975  . Right middle ear infection 1986  . Skin cancer    "burndt it off at the right side of my nose/face" (12/16/2015)    Tobacco History: History  Smoking Status  . Never Smoker  Smokeless Tobacco  . Never Used   Counseling given: Not Answered   Outpatient Encounter  Prescriptions as of 02/22/2016  Medication Sig  . acidophilus (RISAQUAD) CAPS capsule Take 1 capsule by mouth daily.  . cholecalciferol (VITAMIN D) 1000 UNITS tablet Take 2,000 Units by mouth daily.  . enalapril (VASOTEC) 2.5 MG tablet Take 1 tablet (2.5 mg total) by mouth daily.  . ferrous sulfate 325 (65 FE) MG tablet Take 1 tablet (325 mg total) by mouth 2 (two) times daily with a meal.  . fluticasone (FLONASE) 50 MCG/ACT nasal spray Place 2 sprays into both nostrils daily as needed for rhinitis.   . folic acid (FOLVITE) 1 MG tablet Take 2 mg by mouth daily.   . furosemide (LASIX) 40 MG tablet Take 40 mg by mouth every evening.  Marland Kitchen guaiFENesin (MUCINEX) 600 MG 12 hr tablet Take 600 mg by mouth 2 (two) times daily as needed for cough or to loosen phlegm.   . methotrexate (RHEUMATREX) 2.5 MG tablet Take 5-7.5 mg by mouth 2 (two) times a week. Pt takes three tablets every Tuesday and two tablets every Wednesday.  . metoprolol succinate (TOPROL XL) 25 MG 24 hr tablet Take 1 tablet (25 mg total) by mouth 2 (two) times daily. To take along with metoprolol XL 100mg  for a total of 125 mg BID  . metoprolol succinate (TOPROL-XL) 100 MG 24 hr tablet Take 1 tablet (100 mg total) by mouth 2 (two) times daily. Take with or immediately following a meal.  . Multiple Vitamins-Minerals (MULTIVITAMIN WITH MINERALS) tablet Take 1 tablet by mouth daily.  . potassium chloride (K-DUR,KLOR-CON) 10 MEQ tablet Take 2 tablets (20 mEq total) by mouth daily.  . pravastatin (PRAVACHOL) 20 MG tablet Take 20 mg by mouth at bedtime.  . predniSONE (DELTASONE) 2.5 MG tablet TAKE  2 1/2 TABLET BY MOUTH DAILY  . ranitidine (ZANTAC) 150 MG tablet Take 1 tablet (150 mg total) by mouth at bedtime.  . verapamil (CALAN) 120 MG tablet Take 1 tablet (120 mg total) by mouth every 12 (twelve) hours.  Marland Kitchen warfarin (COUMADIN) 5 MG tablet Take 1 tablet (5 mg total) by mouth daily.   No facility-administered encounter medications on file as of  02/22/2016.      Review of Systems  Constitutional:   No  weight loss, night sweats,  Fevers, chills,  +fatigue, or  lassitude.  HEENT:   No headaches,  Difficulty swallowing,  Tooth/dental problems, or  Sore throat,                No sneezing, itching, ear ache, nasal congestion, post nasal drip,   CV:  No chest pain,  Orthopnea, PND, swelling in lower extremities, anasarca, dizziness, palpitations, syncope.   GI  No heartburn, indigestion, abdominal pain, nausea, vomiting, diarrhea, change in bowel habits, loss of appetite, bloody stools.   Resp:    No wheezing.  No chest wall deformity  Skin: no rash or lesions.  GU: no dysuria, change in color of urine, no urgency  or frequency.  No flank pain, no hematuria   MS:  No joint pain or swelling.  No decreased range of motion.  No back pain.    Physical Exam  BP 112/64 (BP Location: Left Arm, Cuff Size: Normal)   Pulse 63   Ht 5\' 3"  (1.6 m)   Wt 141 lb 6.4 oz (64.1 kg)   SpO2 99%   BMI 25.05 kg/m   GEN: A/Ox3; pleasant , NAD, frail and elderly    HEENT:  [place]/AT,  EACs-clear, TMs-wnl, NOSE-clear, THROAT-clear, no lesions, no postnasal drip or exudate noted.   NECK:  Supple w/ fair ROM; no JVD; normal carotid impulses w/o bruits; no thyromegaly or nodules palpated; no lymphadenopathy.    RESP  Decreased BS in bases ,  no accessory muscle use, no dullness to percussion  CARD:  RRR, no m/r/g, tr  peripheral edema, pulses intact, no cyanosis or clubbing.  GI:   Soft & nt; nml bowel sounds; no organomegaly or masses detected.   Musco: Warm bil, no deformities or joint swelling noted.   Neuro: alert, no focal deficits noted.    Skin: Warm, no lesions or rashes    Lab Results:  CBC   ProBNP  Imaging: Dg Chest 2 View  Result Date: 02/22/2016 CLINICAL DATA:  Fatigue, recent history of pneumonia EXAM: CHEST  2 VIEW COMPARISON:  Chest x-ray of 02/08/2016 FINDINGS: Aeration has improved considerably. Bibasilar opacities  and probable small effusions have resolved. Mediastinal and hilar contours are unremarkable. Cardiomegaly is stable. No acute bony abnormality is seen. IMPRESSION: Improved aeration with resolution of basilar opacities. Stable cardiomegaly. Electronically Signed   By: Ivar Drape M.D.   On: 02/22/2016 16:47   Dg Chest 2 View  Result Date: 02/08/2016 CLINICAL DATA:  Worsening shortness of breath for several months. Hypoxia. EXAM: CHEST  2 VIEW COMPARISON:  02/01/2016 FINDINGS: Stable cardiomegaly. Increased pulmonary vascular congestion. No evidence of pulmonary consolidation. Decreased lung volumes seen with new small bilateral pleural effusions and bibasilar atelectasis. IMPRESSION: Cardiomegaly with increased pulmonary vascular congestion. New small bilateral pleural effusions and bibasilar atelectasis. No evidence of pulmonary consolidation. Electronically Signed   By: Earle Gell M.D.   On: 02/08/2016 19:08   Dg Chest 2 View  Result Date: 02/01/2016 CLINICAL DATA:  F/u on pna, cough, sob, remains, HTN, hx of pulmonary failure x 02/2015, afib, EXAM: CHEST  2 VIEW COMPARISON:  Chest x-rays dated 01/10/2016 and 09/15/2015. FINDINGS: Improved aeration at both bases. Lungs now appear clear. No pleural effusion or pneumothorax seen. Cardiomegaly is stable. No acute or suspicious osseous finding. IMPRESSION: No active cardiopulmonary disease. No evidence of pneumonia on today's follow-up chest x-ray. Stable cardiomegaly. Electronically Signed   By: Franki Cabot M.D.   On: 02/01/2016 15:13     Assessment & Plan:   HCAP (healthcare-associated pneumonia) CXR shows improveement w/ resoultion of PNA  Cont on current regimen    Acute on chronic diastolic CHF (congestive heart failure) ([place]) Recent admission improved with diuresis , appears euvolemic currently  Cont w/ follow up with cardiology      Rexene Edison, NP 02/24/2016

## 2016-02-22 NOTE — Patient Instructions (Signed)
Medication Instructions:  Your physician recommends that you continue on your current medications as directed. Please refer to the Current Medication list given to you today.   Labwork: NONE  Testing/Procedures: NON  Follow-Up: Your physician recommends that you schedule a follow-up appointment in: 3 months with Dr. Irish Lack.   Any Other Special Instructions Will Be Listed Below (If Applicable).     If you need a refill on your cardiac medications before your next appointment, please call your pharmacy.

## 2016-02-24 NOTE — Assessment & Plan Note (Signed)
Recent admission improved with diuresis , appears euvolemic currently  Cont w/ follow up with cardiology

## 2016-02-24 NOTE — Assessment & Plan Note (Addendum)
CXR shows improveement w/ resoultion of PNA  Cont on current regimen

## 2016-03-01 ENCOUNTER — Telehealth: Payer: Self-pay | Admitting: Pulmonary Disease

## 2016-03-01 NOTE — Telephone Encounter (Signed)
I do not see in pt's chart where a 6MWT was ordered or requested for. [name] has been informed. Nothing further is needed.

## 2016-03-06 ENCOUNTER — Telehealth: Payer: Self-pay | Admitting: Pulmonary Disease

## 2016-03-06 ENCOUNTER — Ambulatory Visit (INDEPENDENT_AMBULATORY_CARE_PROVIDER_SITE_OTHER): Payer: Medicare Other | Admitting: Interventional Cardiology

## 2016-03-06 DIAGNOSIS — Z5181 Encounter for therapeutic drug level monitoring: Secondary | ICD-10-CM

## 2016-03-06 LAB — POCT INR: INR: 2.8

## 2016-03-06 NOTE — Telephone Encounter (Signed)
BQ  Jennifer the RN from [facility] contacted [place] concerned because she went to visit the pt to listen to her lungs and see how she was doing after her pcp had placed her on levofloxacin. She stated the pt is not in distress but she did notice her lungs did not sound clear and the pt is coughing and her sputum is back to white. She is just concerned with the pt history of pleural effusions if she should come in to our office to be seen. TP has some openings tomorrow, please advise what do you suggest.   Vitals RN obtained BP 98/64 HR 69 O2 95% Temp 97.5, Noted pt has loss 4lbs over a week

## 2016-03-06 NOTE — Telephone Encounter (Signed)
Attempted to contact the pt to see if she would be able to come in tomorrow but unable to leave a vm phone just rung

## 2016-03-07 NOTE — Telephone Encounter (Signed)
Anderson Malta returning call she states that she is about to go in with a patient so it's okay to leave vm - it is secure - She can be reached at [phone] -pr

## 2016-03-07 NOTE — Telephone Encounter (Signed)
Spoke with Anderson Malta with [facility], who states pt's lungs did not sound clear yesterday and pt has a prod cough with white mucus. I have scheduled pt for acute visit with TP on 03-10-26 @ 10:45, as it was first available. I attempted to call pt to make her aware of this ap. Pt's spouse states pt is not currently home, and he will have pt return our call when she returns home. Will await call back.

## 2016-03-07 NOTE — Telephone Encounter (Signed)
Left message for [name] with [facility] to return our call. Will await call back.

## 2016-03-07 NOTE — Telephone Encounter (Signed)
Yes, should be seen by TP

## 2016-03-10 ENCOUNTER — Ambulatory Visit (INDEPENDENT_AMBULATORY_CARE_PROVIDER_SITE_OTHER): Payer: Medicare Other | Admitting: Adult Health

## 2016-03-10 ENCOUNTER — Encounter: Payer: Self-pay | Admitting: Adult Health

## 2016-03-10 ENCOUNTER — Ambulatory Visit (INDEPENDENT_AMBULATORY_CARE_PROVIDER_SITE_OTHER)
Admission: RE | Admit: 2016-03-10 | Discharge: 2016-03-10 | Disposition: A | Discharge status: Home / Self Care | Payer: Medicare Other | Source: Ambulatory Visit | Attending: Adult Health | Admitting: Adult Health

## 2016-03-10 VITALS — BP 122/68 | HR 65 | Ht 63.0 in | Wt 138.2 lb

## 2016-03-10 DIAGNOSIS — J189 Pneumonia, unspecified organism: Secondary | ICD-10-CM

## 2016-03-10 DIAGNOSIS — J45901 Unspecified asthma with (acute) exacerbation: Secondary | ICD-10-CM | POA: Diagnosis not present

## 2016-03-10 MED ORDER — LEVALBUTEROL HCL 0.63 MG/3ML IN NEBU
0.6300 mg | INHALATION_SOLUTION | Freq: Once | RESPIRATORY_TRACT | Status: AC
  Administered 2016-03-10: 0.63 mg via RESPIRATORY_TRACT

## 2016-03-10 MED ORDER — PREDNISONE 10 MG PO TABS: ORAL_TABLET | ORAL | 0 refills | Status: DC

## 2016-03-10 MED ORDER — ALBUTEROL SULFATE (2.5 MG/3ML) 0.083% IN NEBU: 2.5000 mg | INHALATION_SOLUTION | Freq: Four times a day (QID) | RESPIRATORY_TRACT | 0 refills | Status: AC | PRN

## 2016-03-10 NOTE — Assessment & Plan Note (Signed)
Exacerbation -slow to resolve with upper airway cough in immunosuppressed pt . (on Enbrel/MTX)  ?ACE aggravating cough  CXR w/ no acute PNA. ,recent PNA seems to be clearing.  xopenex neb x 1 given  Steroid burst.   Plan  Patient Instructions  Prednisone taper then back to 2.5mg  daily  Mucinex Twice daily  As needed  Cough/congestion  Delsym 2 tsp Twice daily  As needed  Cough .  Sips of water to soothe throat . No MINTS .  Continue on Zantac At bedtime   Discuss with Primary Doctor that [place] may cause your cough to be worse.  Follow up with Dr. Lake Bells in 2 weeks and As needed   Please contact office for sooner follow up if symptoms do not improve or worsen or seek emergency care

## 2016-03-10 NOTE — Progress Notes (Signed)
Reviewed, agree 

## 2016-03-10 NOTE — Addendum Note (Signed)
Addended by: Parke Poisson E on: 03/10/2016 02:21 PM   Modules accepted: Orders

## 2016-03-10 NOTE — Progress Notes (Signed)
@Patient  ID: [name], female    DOB: 24-Jul-1940, 76 y.o.   MRN: [id number]  Chief Complaint  Patient presents with  . Acute Visit    cough     Referring provider: Leeroy Cha,*  HPI: 76 yo female followed for recurrent PNA . Seen initially in 05/2015 for parapneumonic effusion.requiring multiple thoracentesis (cx /cytology neg ) .  Complicated hospitalization w/ recurrent admission Nov/Dec 2017 with Sepsis , pericardial effusion s/p pericardiacenteiss , drain, HCAP, spenic/renal infarcts,  Hx of RA on MTX.   03/10/2016 Acute OV  Pt presents for an acute office visit. Pt has been battling recurrent PNA with critical illness in Nov/Dec with HCAP and was readmitted in Jan with decompensated CHF .  Was seen in office 2 weeks ago with improvement clinically and serial cxr . She says she was doing better until 10 days ago , she developed cough that will not stop , drainage , hoarseness, and increased wheezing , fever 102. Seen for by PCP , started on Levaquin for 10d . Flu swab was neg. She is feeling better but still has bad cough and dyspnea.  Remains on MTX and Prednisone 2.5mg  daily  Enbrel is on hold right now. Last dose 3 weeks ago.  Has albuterol neb at home but ran out of prescription.  CXR today shows further improvement and improved aeration.        Allergies  Allergen Reactions  . Arthrotec [Diclofenac-Misoprostol] Diarrhea  . Erythromycin Anaphylaxis       . Morphine Sulfate Other (See Comments)    Reaction:  Hallucinations   . Amoxicillin Rash and Other (See Comments)    Has patient had a PCN reaction causing immediate rash, facial/tongue/throat swelling, SOB or lightheadedness with hypotension: Yes Has patient had a PCN reaction causing severe rash involving mucus membranes or skin necrosis: Yes Has patient had a PCN reaction that required hospitalization No Has patient had a PCN reaction occurring within the last 10 years: No If all of the above  answers are "NO", then may proceed with Cephalosporin use.  . Diltiazem Hcl Swelling, Rash and Other (See Comments)    Pt is able to tolerate Verapamil.    . Gold-Containing Drug Products Rash  . Macrodantin [Nitrofurantoin Macrocrystal] Rash  . Naproxen Rash  . Penicillins Rash and Other (See Comments)    ++ tolerates cefepime and ceftriaxone ++ Has patient had a PCN reaction causing immediate rash, facial/tongue/throat swelling, SOB or lightheadedness with hypotension: Yes Has patient had a PCN reaction causing severe rash involving mucus membranes or skin necrosis: Yes Has patient had a PCN reaction that required hospitalization No Has patient had a PCN reaction occurring within the last 10 years: No If all of the above answers are "NO", then may proceed with Cephalosporin use.     Immunization History  Administered Date(s) Administered  . Influenza,inj,Quad PF,36+ Mos 10/14/2014, 09/15/2015    Past Medical History:  Diagnosis Date  . Acne rosacea   . Adhesive capsulitis of left shoulder 1980  . Allergic rhinitis   . Chronic atrial fibrillation ([place])    a. Coumadin anticoagulation - [facility] ([place])  . Chronic bronchitis ([place])   . Esophageal reflux   . Frequent epistaxis    "cause of my coumadin" (12/16/2015)  . Heart murmur   . History of echocardiogram    a. Echo 4/17:  EF 55-60%, no RWMA, mild MR, mild LAE, PASP 48 mmHg, trivial effusion post to heart  . Hypercholesterolemia   .  Hyperlipidemia   . Hypertension   . Osteopenia   . Pleural effusion associated with pulmonary infection    Admx 5/17 >> required L thoracentesis (cytology neg for malignancy)   . Pneumonia    "I've had it 4 times this year" (12/16/2015)  . Polymorphic light eruption 2004  . Prolonged QT interval   . Pulmonary HTN   . Respiratory failure ([place]) 04/2015   "spent 3 days; went home; then another 13 days in hospital" (12/16/2015  . Rheumatoid arthritis ([place]) 1975  . Right middle ear  infection 1986  . Skin cancer    "burndt it off at the right side of my nose/face" (12/16/2015)    Tobacco History: History  Smoking Status  . Never Smoker  Smokeless Tobacco  . Never Used   Counseling given: Not Answered   Outpatient Encounter Prescriptions as of 03/10/2016  Medication Sig  . acidophilus (RISAQUAD) CAPS capsule Take 1 capsule by mouth daily.  . cholecalciferol (VITAMIN D) 1000 UNITS tablet Take 2,000 Units by mouth daily.  . enalapril (VASOTEC) 2.5 MG tablet Take 1 tablet (2.5 mg total) by mouth daily.  . ferrous sulfate 325 (65 FE) MG tablet Take 1 tablet (325 mg total) by mouth 2 (two) times daily with a meal.  . fluticasone (FLONASE) 50 MCG/ACT nasal spray Place 2 sprays into both nostrils daily as needed for rhinitis.   . folic acid (FOLVITE) 1 MG tablet Take 2 mg by mouth daily.   . furosemide (LASIX) 40 MG tablet Take 40 mg by mouth every evening.  Marland Kitchen guaiFENesin (MUCINEX) 600 MG 12 hr tablet Take 600 mg by mouth 2 (two) times daily as needed for cough or to loosen phlegm.   . methotrexate (RHEUMATREX) 2.5 MG tablet Take 5-7.5 mg by mouth 2 (two) times a week. Pt takes three tablets every Tuesday and two tablets every Wednesday.  . metoprolol succinate (TOPROL XL) 25 MG 24 hr tablet Take 1 tablet (25 mg total) by mouth 2 (two) times daily. To take along with metoprolol XL 100mg  for a total of 125 mg BID  . metoprolol succinate (TOPROL-XL) 100 MG 24 hr tablet Take 1 tablet (100 mg total) by mouth 2 (two) times daily. Take with or immediately following a meal.  . Multiple Vitamins-Minerals (MULTIVITAMIN WITH MINERALS) tablet Take 1 tablet by mouth daily.  . potassium chloride (K-DUR,KLOR-CON) 10 MEQ tablet Take 2 tablets (20 mEq total) by mouth daily.  . pravastatin (PRAVACHOL) 20 MG tablet Take 20 mg by mouth at bedtime.  . predniSONE (DELTASONE) 2.5 MG tablet TAKE  2 1/2 TABLET BY MOUTH DAILY  . ranitidine (ZANTAC) 150 MG tablet Take 1 tablet (150 mg total) by  mouth at bedtime.  . verapamil (CALAN) 120 MG tablet Take 1 tablet (120 mg total) by mouth every 12 (twelve) hours.  Marland Kitchen warfarin (COUMADIN) 5 MG tablet Take 1 tablet (5 mg total) by mouth daily.  Marland Kitchen albuterol (PROVENTIL) (2.5 MG/3ML) 0.083% nebulizer solution Take 3 mLs (2.5 mg total) by nebulization every 6 (six) hours as needed for wheezing or shortness of breath.  . predniSONE (DELTASONE) 10 MG tablet 4 tabs for 2 days, then 3 tabs for 2 days, 2 tabs for 2 days, then 1 tab for 2 days, then back to 2.5mg  daily   No facility-administered encounter medications on file as of 03/10/2016.      Review of Systems  Constitutional:   No  weight loss, night sweats,  Fevers, chills,  +fatigue, or  lassitude.  HEENT:   No headaches,  Difficulty swallowing,  Tooth/dental problems, or  Sore throat,                No sneezing, itching, ear ache,  +nasal congestion, post nasal drip,   CV:  No chest pain,  Orthopnea, PND, swelling in lower extremities, anasarca, dizziness, palpitations, syncope.   GI  No heartburn, indigestion, abdominal pain, nausea, vomiting, diarrhea, change in bowel habits, loss of appetite, bloody stools.   Resp:  .  No chest wall deformity  Skin: no rash or lesions.  GU: no dysuria, change in color of urine, no urgency or frequency.  No flank pain, no hematuria   MS:  No joint pain or swelling.  No decreased range of motion.  No back pain.    Physical Exam  BP 122/68 (BP Location: Left Arm, Cuff Size: Normal)   Pulse 65   Ht 5\' 3"  (1.6 m)   Wt 138 lb 3.2 oz (62.7 kg)   SpO2 97%   BMI 24.48 kg/m   GEN: A/Ox3; pleasant , NAD,    HEENT:  [place]/AT,  EACs-clear, TMs-wnl, NOSE-clear drainage , THROAT-clear, no lesions, no postnasal drip or exudate noted.   NECK:  Supple w/ fair ROM; no JVD; normal carotid impulses w/o bruits; no thyromegaly or nodules palpated; no lymphadenopathy.    RESP  Few exp wheezing , barking cough ,  no accessory muscle use, no dullness to  percussion  CARD:  RRR, no m/r/g, no peripheral edema, pulses intact, no cyanosis or clubbing.  GI:   Soft & nt; nml bowel sounds; no organomegaly or masses detected.   Musco: Warm bil, no deformities or joint swelling noted.   Neuro: alert, no focal deficits noted.    Skin: Warm, no lesions or rashes     BMET  Imaging: Dg Chest 2 View  Result Date: 03/10/2016 CLINICAL DATA:  Follow of pneumonia treated with antibiotics. EXAM: CHEST  2 VIEW COMPARISON:  02/22/2016.  02/08/2016. FINDINGS: Cardiomegaly again demonstrated. Aortic atherosclerosis again demonstrated. Resolution of bilateral effusions. Lung bases are probably clear at this point. There could be minimal residual atelectasis. Upper lungs are clear. No significant bone finding. IMPRESSION: Resolution of effusions. Total or nearly total clearing of the lung bases. Electronically Signed   By: Nelson Chimes M.D.   On: 03/10/2016 12:06   Dg Chest 2 View  Result Date: 02/22/2016 CLINICAL DATA:  Fatigue, recent history of pneumonia EXAM: CHEST  2 VIEW COMPARISON:  Chest x-ray of 02/08/2016 FINDINGS: Aeration has improved considerably. Bibasilar opacities and probable small effusions have resolved. Mediastinal and hilar contours are unremarkable. Cardiomegaly is stable. No acute bony abnormality is seen. IMPRESSION: Improved aeration with resolution of basilar opacities. Stable cardiomegaly. Electronically Signed   By: Ivar Drape M.D.   On: 02/22/2016 16:47     Assessment & Plan:   Asthmatic bronchitis with acute exacerbation Exacerbation -slow to resolve with upper airway cough in immunosuppressed pt . (on Enbrel/MTX)  ?ACE aggravating cough  CXR w/ no acute PNA. ,recent PNA seems to be clearing.  xopenex neb x 1 given  Steroid burst.   Plan  Patient Instructions  Prednisone taper then back to 2.5mg  daily  Mucinex Twice daily  As needed  Cough/congestion  Delsym 2 tsp Twice daily  As needed  Cough .  Sips of water to soothe  throat . No MINTS .  Continue on Zantac At bedtime   Discuss with Primary Doctor that [facility]  may cause your cough to be worse.  Follow up with Dr. Lake Bells in 2 weeks and As needed   Please contact office for sooner follow up if symptoms do not improve or worsen or seek emergency care      HCAP (healthcare-associated pneumonia) Recent recurrent PNA -clearing on cxr.      Rexene Edison, NP 03/10/2016

## 2016-03-10 NOTE — Patient Instructions (Addendum)
Prednisone taper then back to 2.5mg  daily  Mucinex Twice daily  As needed  Cough/congestion  Delsym 2 tsp Twice daily  As needed  Cough .  Sips of water to soothe throat . No MINTS .  Continue on Zantac At bedtime   Discuss with Primary Doctor that [place] may cause your cough to be worse.  Follow up with Dr. Lake Bells in 2 weeks and As needed   Please contact office for sooner follow up if symptoms do not improve or worsen or seek emergency care

## 2016-03-10 NOTE — Assessment & Plan Note (Signed)
Recent recurrent PNA -clearing on cxr.

## 2016-03-13 ENCOUNTER — Ambulatory Visit (INDEPENDENT_AMBULATORY_CARE_PROVIDER_SITE_OTHER): Payer: Medicare Other | Admitting: Internal Medicine

## 2016-03-13 DIAGNOSIS — Z5181 Encounter for therapeutic drug level monitoring: Secondary | ICD-10-CM

## 2016-03-13 LAB — POCT INR: INR: 3.7

## 2016-03-14 ENCOUNTER — Ambulatory Visit: Payer: Medicare Other | Admitting: Pulmonary Disease

## 2016-03-20 ENCOUNTER — Ambulatory Visit (INDEPENDENT_AMBULATORY_CARE_PROVIDER_SITE_OTHER): Payer: Medicare Other | Admitting: Internal Medicine

## 2016-03-20 DIAGNOSIS — Z5181 Encounter for therapeutic drug level monitoring: Secondary | ICD-10-CM

## 2016-03-20 LAB — POCT INR: INR: 1.8

## 2016-03-23 ENCOUNTER — Ambulatory Visit (INDEPENDENT_AMBULATORY_CARE_PROVIDER_SITE_OTHER): Payer: Medicare Other | Admitting: Pulmonary Disease

## 2016-03-23 ENCOUNTER — Encounter: Payer: Self-pay | Admitting: Pulmonary Disease

## 2016-03-23 DIAGNOSIS — K219 Gastro-esophageal reflux disease without esophagitis: Secondary | ICD-10-CM | POA: Diagnosis not present

## 2016-03-23 DIAGNOSIS — R05 Cough: Secondary | ICD-10-CM | POA: Diagnosis not present

## 2016-03-23 DIAGNOSIS — J90 Pleural effusion, not elsewhere classified: Secondary | ICD-10-CM

## 2016-03-23 DIAGNOSIS — J309 Allergic rhinitis, unspecified: Secondary | ICD-10-CM | POA: Diagnosis not present

## 2016-03-23 DIAGNOSIS — R059 Cough, unspecified: Secondary | ICD-10-CM

## 2016-03-23 NOTE — Progress Notes (Signed)
Subjective:    Patient ID: [name], female    DOB: 1940/09/26, 75 y.o.   MRN: [id number]  Synopsis: First seen by [name] pulmonary in May 2017 while hospitalized for a parapneumonic effusion. She been hospitalized for pneumonia one month prior. Multiple thoracenteses were performed on the left sided effusion which showed an exudate. Culture negative, cytology negative. An echocardiogram in 2017 was suggestive of pulmonary hypertension. Since then in late 2017 she developed a large pericardial effusion, she had c diff, HCAP, and had splenic and renal infarcts noted in 12/2015.  These were believed to be due to an infectious source of some sort.  She has had atrial fibrillation in the midst of all this as well.   HPI Chief Complaint  Patient presents with  . Follow-up    pt much improved since last office visit but does note sometimes prod cough with yellow thick mucus.    [name] has been doing OK but she says that she has a severe cough.  She feels like thre is mucus in her chest but she can't get it out.  It is worse with lying flat.  It is not worse with meals.  It is not associated with eating.  Talking makes her cough more.  Cold weather and windy makes it worse.  She has sinus congestion and post nasal drip.  She is using her neti pot regularly.  She has been told she has allergic rhinitis but she is not taking anything for it.  She was on allergy medicines as a kid.  She takes flonase only as needed.     Past Medical History:  Diagnosis Date  . Acne rosacea   . Adhesive capsulitis of left shoulder 1980  . Allergic rhinitis   . Chronic atrial fibrillation ([place])    a. Coumadin anticoagulation - [facility] ([place])  . Chronic bronchitis ([place])   . Esophageal reflux   . Frequent epistaxis    "cause of my coumadin" (12/16/2015)  . Heart murmur   . History of echocardiogram    a. Echo 4/17:  EF 55-60%, no RWMA, mild MR, mild LAE, PASP 48 mmHg, trivial effusion post to  heart  . Hypercholesterolemia   . Hyperlipidemia   . Hypertension   . Osteopenia   . Pleural effusion associated with pulmonary infection    Admx 5/17 >> required L thoracentesis (cytology neg for malignancy)   . Pneumonia    "I've had it 4 times this year" (12/16/2015)  . Polymorphic light eruption 2004  . Prolonged QT interval   . Pulmonary HTN   . Respiratory failure ([place]) 04/2015   "spent 3 days; went home; then another 13 days in hospital" (12/16/2015  . Rheumatoid arthritis ([place]) 1975  . Right middle ear infection 1986  . Skin cancer    "burndt it off at the right side of my nose/face" (12/16/2015)      Review of Systems  Constitutional: Negative for chills, fatigue and fever.  HENT: Negative for postnasal drip, rhinorrhea and sore throat.   Respiratory: Negative for choking, shortness of breath and wheezing.   Cardiovascular: Negative for chest pain, palpitations and leg swelling.       Objective:   Physical Exam Vitals:   03/23/16 1451  BP: 116/64  Pulse: 71  SpO2: 99%  Weight: 139 lb (63 kg)  Height: 5\' 3"  (1.6 m)   RA  Gen: well appearing HENT: OP clear, TM's clear, neck supple PULM: CTA B, normal  percussion CV: RRR, no mgr, trace edema GI: BS+, soft, nontender Derm: no cyanosis or rash Psyche: normal mood and affect  February 2018 chest x-ray shows clear lungs, no pleural effusion images independently reviewed by me today  CBC    Component Value Date/Time   WBC 9.5 02/11/2016 0539   RBC 4.25 02/11/2016 0539   HGB 12.3 02/11/2016 0539   HCT 37.6 02/11/2016 0539   PLT 322 02/11/2016 0539   MCV 88.5 02/11/2016 0539   MCH 28.9 02/11/2016 0539   MCHC 32.7 02/11/2016 0539   RDW 17.5 (H) 02/11/2016 0539   LYMPHSABS 1.0 02/08/2016 1950   MONOABS 1.3 (H) 02/08/2016 1950   EOSABS 0.0 02/08/2016 1950   BASOSABS 0.0 02/08/2016 1950       Assessment & Plan:  Allergic rhinitis She says this problem is worse.  It is contributing to her cough. She  is currently not taking nasal steroids for this.  Plan: Counseled on basic pharmacokinetic kinetics and appropriate use of nasal steroids, advice use Flonase regular. Use Zyrtec regularly  Pleural effusion, right Resolved  Cough This problem has been worse recently. It is due to postnasal drip, acid reflux, and laryngeal irritation causing perpetual cough.  Plan: Get acid reflux under control Get allergic rhinitis under control About one week after that, voice rest. For about 3 days  GERD (gastroesophageal reflux disease) Worse recently. Contributing to cough. Was better controlled on omeprazole.  Plan: I asked her to talk to her pharmacist to see if she can take omeprazole with her current medication regimen If okay by pharmacy, I will prescribe omeprazole again    Current Outpatient Prescriptions:  .  acidophilus (RISAQUAD) CAPS capsule, Take 1 capsule by mouth daily., Disp: , Rfl:  .  albuterol (PROVENTIL) (2.5 MG/3ML) 0.083% nebulizer solution, Take 3 mLs (2.5 mg total) by nebulization every 6 (six) hours as needed for wheezing or shortness of breath., Disp: 75 mL, Rfl: 0 .  cholecalciferol (VITAMIN D) 1000 UNITS tablet, Take 2,000 Units by mouth daily., Disp: , Rfl:  .  ferrous sulfate 325 (65 FE) MG tablet, Take 1 tablet (325 mg total) by mouth 2 (two) times daily with a meal., Disp: 30 tablet, Rfl: 0 .  fluticasone (FLONASE) 50 MCG/ACT nasal spray, Place 2 sprays into both nostrils daily as needed for rhinitis. , Disp: , Rfl:  .  folic acid (FOLVITE) 1 MG tablet, Take 2 mg by mouth daily. , Disp: , Rfl:  .  furosemide (LASIX) 40 MG tablet, Take 40 mg by mouth every evening., Disp: , Rfl:  .  guaiFENesin (MUCINEX) 600 MG 12 hr tablet, Take 600 mg by mouth 2 (two) times daily as needed for cough or to loosen phlegm. , Disp: , Rfl:  .  methotrexate (RHEUMATREX) 2.5 MG tablet, Take 5-7.5 mg by mouth 2 (two) times a week. Pt takes three tablets every Tuesday and two tablets every  Wednesday., Disp: , Rfl: 1 .  metoprolol succinate (TOPROL XL) 25 MG 24 hr tablet, Take 1 tablet (25 mg total) by mouth 2 (two) times daily. To take along with metoprolol XL 100mg  for a total of 125 mg BID, Disp: 180 tablet, Rfl: 3 .  metoprolol succinate (TOPROL-XL) 100 MG 24 hr tablet, Take 1 tablet (100 mg total) by mouth 2 (two) times daily. Take with or immediately following a meal., Disp: 180 tablet, Rfl: 3 .  Multiple Vitamins-Minerals (MULTIVITAMIN WITH MINERALS) tablet, Take 1 tablet by mouth daily., Disp: , Rfl:  .  potassium chloride (K-DUR,KLOR-CON) 10 MEQ tablet, Take 2 tablets (20 mEq total) by mouth daily., Disp: 60 tablet, Rfl: 1 .  pravastatin (PRAVACHOL) 20 MG tablet, Take 20 mg by mouth at bedtime., Disp: , Rfl:  .  predniSONE (DELTASONE) 2.5 MG tablet, TAKE  2 1/2 TABLET BY MOUTH DAILY, Disp: , Rfl:  .  ranitidine (ZANTAC) 150 MG tablet, Take 1 tablet (150 mg total) by mouth at bedtime., Disp: 30 tablet, Rfl: 0 .  verapamil (CALAN) 120 MG tablet, Take 1 tablet (120 mg total) by mouth every 12 (twelve) hours., Disp: 60 tablet, Rfl: 0 .  warfarin (COUMADIN) 5 MG tablet, Take 1 tablet (5 mg total) by mouth daily., Disp: 90 tablet, Rfl: 3

## 2016-03-23 NOTE — Assessment & Plan Note (Signed)
This problem has been worse recently. It is due to postnasal drip, acid reflux, and laryngeal irritation causing perpetual cough.  Plan: Get acid reflux under control Get allergic rhinitis under control About one week after that, voice rest. For about 3 days

## 2016-03-23 NOTE — Assessment & Plan Note (Signed)
Resolved

## 2016-03-23 NOTE — Assessment & Plan Note (Signed)
Worse recently. Contributing to cough. Was better controlled on omeprazole.  Plan: I asked her to talk to her pharmacist to see if she can take omeprazole with her current medication regimen If okay by pharmacy, I will prescribe omeprazole again

## 2016-03-23 NOTE — Assessment & Plan Note (Signed)
She says this problem is worse.  It is contributing to her cough. She is currently not taking nasal steroids for this.  Plan: Counseled on basic pharmacokinetic kinetics and appropriate use of nasal steroids, advice use Flonase regular. Use Zyrtec regularly

## 2016-03-23 NOTE — Patient Instructions (Signed)
For your post nasal drip: Use Neti Pot or Milta Deiters Med rinses with distilled water at least twice per day using the instructions on the package. 1/2 hour after using the [facility] Med rinse, use Nasacort two puffs in each nostril once per day.  Remember that the Nasacort can take 1-2 weeks to work after regular use. Use generic zyrtec (cetirizine) every day.  If this doesn't help, then stop taking it and use chlorpheniramine-phenylephrine combination tablets.  For your Acid reflux: Talk to the pharmacist to see if you can take omeprazole, if so then I will prescribe it  For your cough: You need to try to suppress your cough to allow your larynx (voice box) to heal.  For three days don't talk, laugh, sing, or clear your throat. Do everything you can to suppress the cough during this time. Use hard candies (sugarless Jolly Ranchers) or non-mint or non-menthol containing cough drops during this time to soothe your throat.  Use a cough suppressant (Delsym or what I have prescribed you) around the clock during this time.  After three days, gradually increase the use of your voice and back off on the cough suppressants.  Follow up with [place] in 2-3 months or sooner if needed

## 2016-03-28 ENCOUNTER — Ambulatory Visit: Payer: Medicare Other | Admitting: Interventional Cardiology

## 2016-03-29 ENCOUNTER — Ambulatory Visit (INDEPENDENT_AMBULATORY_CARE_PROVIDER_SITE_OTHER): Payer: Medicare Other | Admitting: Pharmacist

## 2016-03-29 DIAGNOSIS — Z5181 Encounter for therapeutic drug level monitoring: Secondary | ICD-10-CM

## 2016-03-29 LAB — POCT INR: INR: 2.3

## 2016-03-31 ENCOUNTER — Telehealth: Payer: Self-pay | Admitting: Adult Health

## 2016-03-31 NOTE — Telephone Encounter (Signed)
Spoke with [employer] company requesting a dx code from 2/23 OV to cover pt's albuterol. Dx codes given.  Nothing further needed.

## 2016-04-11 ENCOUNTER — Ambulatory Visit (INDEPENDENT_AMBULATORY_CARE_PROVIDER_SITE_OTHER): Payer: Medicare Other | Admitting: Cardiology

## 2016-04-11 DIAGNOSIS — Z5181 Encounter for therapeutic drug level monitoring: Secondary | ICD-10-CM

## 2016-04-11 LAB — POCT INR: INR: 1.6

## 2016-04-17 ENCOUNTER — Telehealth: Payer: Self-pay | Admitting: Interventional Cardiology

## 2016-04-17 NOTE — Telephone Encounter (Signed)
Reviewed information with Vaughan Basta who will notify the patient.  She will c/b with any other questions or concerns.

## 2016-04-17 NOTE — Telephone Encounter (Signed)
New message    Pulse 41 bp 100/70 deminished sound in lung

## 2016-04-17 NOTE — Telephone Encounter (Signed)
Spoke with Vaughan Basta with [employer].  She is calling to report pt's HR today is 41 and regular, BP 100/70 and she is having some SOB with activity only.  She denies and edema and weights are stable at 140 lbs.  Pt is taking Metoprolol succinate 125 mg BID (100 and a 25 mg tablet twice a day).  Advised to hold Metoprolol dose this evening if HR remains below 60 bpm.  I will forward this information to MD for review.

## 2016-04-17 NOTE — Telephone Encounter (Signed)
Agree to hold evening dose of metoprolol today.  Watch for lightheadedness or syncope.

## 2016-04-25 ENCOUNTER — Ambulatory Visit (INDEPENDENT_AMBULATORY_CARE_PROVIDER_SITE_OTHER): Payer: Medicare Other

## 2016-04-25 DIAGNOSIS — Z5181 Encounter for therapeutic drug level monitoring: Secondary | ICD-10-CM

## 2016-04-25 LAB — POCT INR: INR: 1.9

## 2016-05-04 ENCOUNTER — Encounter: Payer: Self-pay | Admitting: Interventional Cardiology

## 2016-05-04 ENCOUNTER — Ambulatory Visit (INDEPENDENT_AMBULATORY_CARE_PROVIDER_SITE_OTHER): Payer: Medicare Other | Admitting: Pharmacist

## 2016-05-04 DIAGNOSIS — Z5181 Encounter for therapeutic drug level monitoring: Secondary | ICD-10-CM

## 2016-05-04 LAB — POCT INR: INR: 2.3

## 2016-05-17 ENCOUNTER — Ambulatory Visit (INDEPENDENT_AMBULATORY_CARE_PROVIDER_SITE_OTHER): Payer: Medicare Other | Admitting: *Deleted

## 2016-05-17 DIAGNOSIS — Z7901 Long term (current) use of anticoagulants: Secondary | ICD-10-CM | POA: Diagnosis not present

## 2016-05-17 DIAGNOSIS — I4891 Unspecified atrial fibrillation: Secondary | ICD-10-CM

## 2016-05-17 DIAGNOSIS — Z5181 Encounter for therapeutic drug level monitoring: Secondary | ICD-10-CM

## 2016-05-17 LAB — POCT INR: INR: 3

## 2016-05-24 ENCOUNTER — Encounter (INDEPENDENT_AMBULATORY_CARE_PROVIDER_SITE_OTHER): Payer: Self-pay

## 2016-05-24 ENCOUNTER — Encounter: Payer: Self-pay | Admitting: Interventional Cardiology

## 2016-05-24 ENCOUNTER — Ambulatory Visit (INDEPENDENT_AMBULATORY_CARE_PROVIDER_SITE_OTHER): Payer: Medicare Other | Admitting: Interventional Cardiology

## 2016-05-24 VITALS — BP 110/72 | HR 102 | Ht 63.0 in | Wt 148.1 lb

## 2016-05-24 DIAGNOSIS — I482 Chronic atrial fibrillation: Secondary | ICD-10-CM | POA: Diagnosis not present

## 2016-05-24 DIAGNOSIS — I10 Essential (primary) hypertension: Secondary | ICD-10-CM | POA: Diagnosis not present

## 2016-05-24 DIAGNOSIS — I4821 Permanent atrial fibrillation: Secondary | ICD-10-CM

## 2016-05-24 DIAGNOSIS — Z7901 Long term (current) use of anticoagulants: Secondary | ICD-10-CM

## 2016-05-24 NOTE — Progress Notes (Signed)
Cardiology Office Note   Date:  05/24/2016   ID:  [name], [name] 1940/09/10, MRN [id number]  PCP:  Leeroy Cha, MD    No chief complaint on file. AFib   Wt Readings from Last 3 Encounters:  05/24/16 148 lb 1.9 oz (67.2 kg)  03/23/16 139 lb (63 kg)  03/10/16 138 lb 3.2 oz (62.7 kg)       History of Present Illness: [name] is a 76 y.o. female  with a hx of chronic AF, RA, HTN, HL.  She is on Coumadin for anticoagulation.   She was admitted in 4/17 with chest pain. Echo demonstrated normal LVEF with mod pulmonary HTN (PASP 48 mmHg). CEs remained neg. No further ischemic workup was pursued.  She was then readmitted 5/17 with HCAP with assoc moderate pleural effusion.  She underwent left-sided thoracentesis.  Effusion was felt to be exudative and concerning for infectious-empyema. Cytology was negative for malignancy.   She is feeling better but still has some DOE.  She does not do much exercise.  She still feels occasional palpitations, but they only last a few seconds and are manageable.    She had a pericardial effusion in 11/17.  THis was drained and was bloody.    Diarrhea cleared up since the last visit.  She took iron for anemia and developed constipation.   Today, she feels better. Her energy level is better. Stress at home due to her husbands condition.  Her daughter helps her to get the husband out of bed.  This is a big help.    Past Medical History:  Diagnosis Date  . Acne rosacea   . Adhesive capsulitis of left shoulder 1980  . Allergic rhinitis   . Chronic atrial fibrillation ([place])    a. Coumadin anticoagulation - [facility] ([place])  . Chronic bronchitis ([place])   . Esophageal reflux   . Frequent epistaxis    "cause of my coumadin" (12/16/2015)  . Heart murmur   . History of echocardiogram    a. Echo 4/17:  EF 55-60%, no RWMA, mild MR, mild LAE, PASP 48 mmHg, trivial effusion post to heart  . Hypercholesterolemia     . Hyperlipidemia   . Hypertension   . Osteopenia   . Pleural effusion associated with pulmonary infection    Admx 5/17 >> required L thoracentesis (cytology neg for malignancy)   . Pneumonia    "I've had it 4 times this year" (12/16/2015)  . Polymorphic light eruption 2004  . Prolonged QT interval   . Pulmonary HTN ([place])   . Respiratory failure ([place]) 04/2015   "spent 3 days; went home; then another 13 days in hospital" (12/16/2015  . Rheumatoid arthritis ([place]) 1975  . Right middle ear infection 1986  . Skin cancer    "burndt it off at the right side of my nose/face" (12/16/2015)    Past Surgical History:  Procedure Laterality Date  . BREAST BIOPSY Left 2000s X 2  . CARDIAC CATHETERIZATION  03/2010   a. [place] 2/09: no ischemia; low risk  //  b. [place] 3/12: normal coronary arteries  . CARDIAC CATHETERIZATION N/A 12/16/2015   Procedure: Pericardiocentesis;  Surgeon: Nelva Bush, MD;  Location: [facility];  Service: Cardiovascular;  Laterality: N/A;  . CARDIOVERSION  04/2010  . TEMPOROMANDIBULAR JOINT SURGERY Bilateral   . TOTAL ABDOMINAL HYSTERECTOMY       Current Outpatient Prescriptions  Medication Sig Dispense Refill  . acidophilus (RISAQUAD) CAPS  capsule Take 1 capsule by mouth daily.    Marland Kitchen albuterol (PROVENTIL) (2.5 MG/3ML) 0.083% nebulizer solution Take 3 mLs (2.5 mg total) by nebulization every 6 (six) hours as needed for wheezing or shortness of breath. 75 mL 0  . cetirizine (ZYRTEC) 10 MG tablet Take 10 mg by mouth daily.    . cholecalciferol (VITAMIN D) 1000 UNITS tablet Take 2,000 Units by mouth daily.    . ferrous sulfate 325 (65 FE) MG tablet Take 1 tablet (325 mg total) by mouth 2 (two) times daily with a meal. 30 tablet 0  . fluticasone (FLONASE) 50 MCG/ACT nasal spray Place 2 sprays into both nostrils daily as needed for rhinitis.     . folic acid (FOLVITE) 1 MG tablet Take 2 mg by mouth daily.     . furosemide (LASIX) 40 MG tablet Take 40 mg by mouth  every evening.    Marland Kitchen guaiFENesin (MUCINEX) 600 MG 12 hr tablet Take 600 mg by mouth 2 (two) times daily as needed for cough or to loosen phlegm.     . methotrexate (RHEUMATREX) 2.5 MG tablet Take 5-7.5 mg by mouth 2 (two) times a week. Pt takes three tablets every Tuesday and two tablets every Wednesday.  1  . metoprolol succinate (TOPROL XL) 25 MG 24 hr tablet Take 1 tablet (25 mg total) by mouth 2 (two) times daily. To take along with metoprolol XL 100mg  for a total of 125 mg BID 180 tablet 3  . metoprolol succinate (TOPROL-XL) 100 MG 24 hr tablet Take 1 tablet (100 mg total) by mouth 2 (two) times daily. Take with or immediately following a meal. 180 tablet 3  . Multiple Vitamins-Minerals (MULTIVITAMIN WITH MINERALS) tablet Take 1 tablet by mouth daily.    . potassium chloride (K-DUR,KLOR-CON) 10 MEQ tablet Take 2 tablets (20 mEq total) by mouth daily. 60 tablet 1  . pravastatin (PRAVACHOL) 20 MG tablet Take 20 mg by mouth at bedtime.    . predniSONE (DELTASONE) 2.5 MG tablet Take 2.5 mg by mouth daily with breakfast.     . ranitidine (ZANTAC) 150 MG tablet Take 1 tablet (150 mg total) by mouth at bedtime. 30 tablet 0  . verapamil (CALAN) 120 MG tablet Take 1 tablet (120 mg total) by mouth every 12 (twelve) hours. 60 tablet 0  . warfarin (COUMADIN) 5 MG tablet Take 1 tablet (5 mg total) by mouth daily. 90 tablet 3   No current facility-administered medications for this visit.     Allergies:   Arthrotec [diclofenac-misoprostol]; Erythromycin; Morphine sulfate; Amoxicillin; Diltiazem hcl; Gold-containing drug products; Macrodantin [nitrofurantoin macrocrystal]; Naproxen; and Penicillins    Social History:  The patient  reports that she has never smoked. She has never used smokeless tobacco. She reports that she does not drink alcohol or use drugs.   Family History:  The patient's family history includes Healthy in her sister; Heart disease in her father, mother, sister, and sister; Other in her  mother.    ROS:  Please see the history of present illness.   Otherwise, review of systems are positive for Recent diarrhea.   All other systems are reviewed and negative.    PHYSICAL EXAM: VS:  BP 110/72 (BP Location: Left Arm, Patient Position: Sitting, Cuff Size: Normal)   Pulse (!) 102   Ht 5\' 3"  (1.6 m)   Wt 148 lb 1.9 oz (67.2 kg)   SpO2 97%   BMI 26.24 kg/m  , BMI Body mass index is 26.24 kg/m.  GEN: Well nourished, well developed, in no acute distress  HEENT: normal  Neck: no JVD, carotid bruits, or masses Cardiac: irregularly irregular; no murmurs, rubs, or gallops,no edema  Respiratory:  Scant basilar crackles bilaterally, normal work of breathing GI: soft, nontender, nondistended, + BS MS: no deformity or atrophy  Skin: warm and dry, no rash Neuro:  Strength and sensation are intact Psych: euthymic mood, full affect    Recent Labs: 12/13/2015: Pro B Natriuretic peptide (BNP) 215.0 01/08/2016: TSH 1.999 01/11/2016: ALT 22 02/08/2016: B Natriuretic Peptide 415.9 02/10/2016: Magnesium 2.1 02/11/2016: BUN 25; Creatinine, Ser 0.88; Hemoglobin 12.3; Platelets 322; Potassium 3.7; Sodium 134   Lipid Panel    Component Value Date/Time   CHOL 134 05/24/2015 1344   TRIG 148.0 05/21/2013 0846   HDL 36.60 (L) 05/21/2013 0846   CHOLHDL 4 05/21/2013 0846   VLDL 29.6 05/21/2013 0846   LDLCALC 79 05/21/2013 0846     Other studies Reviewed: Additional studies/ records that were reviewed today with results demonstrating: Hospital records reviewed.   ASSESSMENT AND PLAN:  1. AFib, rate controlled.  Currently off Coumadin or stroke prevention due to bloody pericardial effusion.  Continue current dose of Metoprolol Succinate and Verapamil.She cannot tolerate Diltiazem due to significant swelling. CHADS2-VASc=3 (female, age > 64, HTN). Restarted Coumadin during the first week of January 2018. Limited echocardiogram at the end of January 2018 for pericardial effusion showed no  effusion. INR checked with the pharmacy.  THere has been some change needed in the dose.  No bleeding issues. COntinue anticoagulation. 2. HTN: Blood pressure controlled. Continue current medications. 3. Dyspnea on exertion: I think is related to some deconditioning.  I encouraged more activity. 4. Husband no longer walking after hip fracture.     Current medicines are reviewed at length with the patient today.  The patient concerns regarding her medicines were addressed.  The following changes have been made:  No change  Labs/ tests ordered today include:  No orders of the defined types were placed in this encounter.   Recommend 150 minutes/week of aerobic exercise Low fat, low carb, high fiber diet recommended  Disposition:   FU in 9 months   Signed, Larae Grooms, MD  05/24/2016 1:57 PM    [facility] [address], [place], [place]  [address] Phone: [phone]; Fax: [phone]

## 2016-05-24 NOTE — Patient Instructions (Signed)
Medication Instructions:  Your physician recommends that you continue on your current medications as directed. Please refer to the Current Medication list given to you today.   Labwork: None ordered  Testing/Procedures: None ordered  Follow-Up: Your physician wants you to follow-up in: 9 months with Dr. Varanasi. You will receive a reminder letter in the mail two months in advance. If you don't receive a letter, please call our office to schedule the follow-up appointment.   Any Other Special Instructions Will Be Listed Below (If Applicable).     If you need a refill on your cardiac medications before your next appointment, please call your pharmacy.   

## 2016-06-07 ENCOUNTER — Ambulatory Visit (INDEPENDENT_AMBULATORY_CARE_PROVIDER_SITE_OTHER): Payer: Medicare Other | Admitting: *Deleted

## 2016-06-07 DIAGNOSIS — I4891 Unspecified atrial fibrillation: Secondary | ICD-10-CM | POA: Diagnosis not present

## 2016-06-07 DIAGNOSIS — Z5181 Encounter for therapeutic drug level monitoring: Secondary | ICD-10-CM

## 2016-06-07 DIAGNOSIS — Z7901 Long term (current) use of anticoagulants: Secondary | ICD-10-CM

## 2016-06-07 LAB — POCT INR: INR: 2.8

## 2016-07-04 ENCOUNTER — Encounter: Payer: Self-pay | Admitting: Pulmonary Disease

## 2016-07-04 ENCOUNTER — Ambulatory Visit (INDEPENDENT_AMBULATORY_CARE_PROVIDER_SITE_OTHER): Payer: Medicare Other | Admitting: Pulmonary Disease

## 2016-07-04 VITALS — BP 108/64 | HR 52 | Ht 63.0 in | Wt 147.0 lb

## 2016-07-04 DIAGNOSIS — R05 Cough: Secondary | ICD-10-CM | POA: Diagnosis not present

## 2016-07-04 DIAGNOSIS — J45901 Unspecified asthma with (acute) exacerbation: Secondary | ICD-10-CM | POA: Diagnosis not present

## 2016-07-04 DIAGNOSIS — K219 Gastro-esophageal reflux disease without esophagitis: Secondary | ICD-10-CM | POA: Diagnosis not present

## 2016-07-04 DIAGNOSIS — R059 Cough, unspecified: Secondary | ICD-10-CM

## 2016-07-04 NOTE — Progress Notes (Signed)
Subjective:    Patient ID: [name], female    DOB: [birth year], 76 y.o.   MRN: [id number]  Synopsis: First seen by [facility] in May 2017 while hospitalized for a parapneumonic effusion. She been hospitalized for pneumonia one month prior. Multiple thoracenteses were performed on the left sided effusion which showed an exudate. Culture negative, cytology negative. An echocardiogram in 2017 was suggestive of pulmonary hypertension. Since then in late 2017 she developed a large pericardial effusion, she had c diff, HCAP, and had splenic and renal infarcts noted in 12/2015.  These were believed to be due to an infectious source of some sort.  She has had atrial fibrillation in the midst of all this as well.   HPI Chief Complaint  Patient presents with  . Follow-up    pt states she is doing well, notes sob with exertion.  states she makes a strong effort to exert herself on a regular basis.     [name] has been doing OK.  Her heart rate has been OK.  She followed up with Cardiology two weeks ago and she had a good check up.  Apparently her liver enzymes were a little abnormal so they are adjusting the coumadin right now.  She says that she still has some dyspnea while walking.  Climbing a flight of stairs will make her short of breath.  Pulling her garbage can will make her short of breath.  She tries to exercise some and walks fairly regularly.  She has 5 dogs that she walks.    THe cough has gone away. She has some post nasal drip and heart burn, but they are both better.  Her cardiology team told her to stop taking omeprazole.    Past Medical History:  Diagnosis Date  . Acne rosacea   . Adhesive capsulitis of left shoulder 1980  . Allergic rhinitis   . Chronic atrial fibrillation ([place])    a. Coumadin anticoagulation - [facility] ([place])  . Chronic bronchitis ([place])   . Esophageal reflux   . Frequent epistaxis    "cause of my coumadin" (12/16/2015)  . Heart murmur     . History of echocardiogram    a. Echo 4/17:  EF 55-60%, no RWMA, mild MR, mild LAE, PASP 48 mmHg, trivial effusion post to heart  . Hypercholesterolemia   . Hyperlipidemia   . Hypertension   . Osteopenia   . Pleural effusion associated with pulmonary infection    Admx 5/17 >> required L thoracentesis (cytology neg for malignancy)   . Pneumonia    "I've had it 4 times this year" (12/16/2015)  . Polymorphic light eruption 2004  . Prolonged QT interval   . Pulmonary HTN ([place])   . Respiratory failure ([place]) 04/2015   "spent 3 days; went home; then another 13 days in hospital" (12/16/2015  . Rheumatoid arthritis ([place]) 1975  . Right middle ear infection 1986  . Skin cancer    "burndt it off at the right side of my nose/face" (12/16/2015)      Review of Systems  Constitutional: Negative for chills, fatigue and fever.  HENT: Negative for postnasal drip, rhinorrhea and sore throat.   Respiratory: Negative for choking, shortness of breath and wheezing.   Cardiovascular: Negative for chest pain, palpitations and leg swelling.       Objective:   Physical Exam Vitals:   07/04/16 1441  BP: 108/64  Pulse: (!) 52  SpO2: 98%  Weight: 147 lb (66.7 kg)  Height: 5\' 3"  (1.6 m)   RA  Gen: well appearing HENT: OP clear, TM's clear, neck supple PULM: CTA B, normal percussion CV: RRR, no mgr, trace edema GI: BS+, soft, nontender Derm: no cyanosis or rash Psyche: normal mood and affect   February 2018 chest x-ray shows clear lungs, no pleural effusion images independently reviewed by me today  CBC    Component Value Date/Time   WBC 9.5 02/11/2016 0539   RBC 4.25 02/11/2016 0539   HGB 12.3 02/11/2016 0539   HCT 37.6 02/11/2016 0539   PLT 322 02/11/2016 0539   MCV 88.5 02/11/2016 0539   MCH 28.9 02/11/2016 0539   MCHC 32.7 02/11/2016 0539   RDW 17.5 (H) 02/11/2016 0539   LYMPHSABS 1.0 02/08/2016 1950   MONOABS 1.3 (H) 02/08/2016 1950   EOSABS 0.0 02/08/2016 1950   BASOSABS  0.0 02/08/2016 1950       Assessment & Plan:  Cough  Gastroesophageal reflux disease without esophagitis  Asthmatic bronchitis with acute exacerbation, unspecified asthma severity, unspecified whether persistent   Discussion: [name] has done quite well in the last several months. She has some shortness of breath which I think is due mostly to deconditioning. I have encouraged her to exercise regularly. She has no evidence of a significant underlying lung disease though she reports some asthma. If she has asthma it's mild persistent at best.  She does have allergic rhinitis and acid reflux which both contribute to the severity of her cough. Fortunately her cough has improved significantly right now.  Plan: For your allergic rhinitis: Use Neil Med rinses with distilled water at least twice per day using the instructions on the package. 1/2 hour after using the [facility] Med rinse, use Nasacort two puffs in each nostril once per day.  Remember that the Nasacort can take 1-2 weeks to work after regular use. Use generic zyrtec (cetirizine) every day.  If this doesn't help, then stop taking it and use chlorpheniramine-phenylephrine combination tablets.  For your asthma: This seems to be stable right now Let us know if it is worsening.  For your heart burn: He taking ranitidine If her cough gets worse though he can take omeprazole for a few weeks  For your shortness of breath: Exercise regularly Let us know if it gets worse  We will see you back if any the symptoms are worsening.   Current Outpatient Prescriptions:  .  acidophilus (RISAQUAD) CAPS capsule, Take 1 capsule by mouth daily., Disp: , Rfl:  .  albuterol (PROVENTIL) (2.5 MG/3ML) 0.083% nebulizer solution, Take 3 mLs (2.5 mg total) by nebulization every 6 (six) hours as needed for wheezing or shortness of breath., Disp: 75 mL, Rfl: 0 .  cetirizine (ZYRTEC) 10 MG tablet, Take 10 mg by mouth daily., Disp: , Rfl:  .   cholecalciferol (VITAMIN D) 1000 UNITS tablet, Take 2,000 Units by mouth daily., Disp: , Rfl:  .  ferrous sulfate 325 (65 FE) MG tablet, Take 1 tablet (325 mg total) by mouth 2 (two) times daily with a meal., Disp: 30 tablet, Rfl: 0 .  fluticasone (FLONASE) 50 MCG/ACT nasal spray, Place 2 sprays into both nostrils daily as needed for rhinitis. , Disp: , Rfl:  .  folic acid (FOLVITE) 1 MG tablet, Take 2 mg by mouth daily. , Disp: , Rfl:  .  furosemide (LASIX) 40 MG tablet, Take 40 mg by mouth every evening., Disp: , Rfl:  .  guaiFENesin (MUCINEX) 600 MG 12 hr tablet, Take 600  mg by mouth 2 (two) times daily as needed for cough or to loosen phlegm. , Disp: , Rfl:  .  methotrexate (RHEUMATREX) 2.5 MG tablet, Take 5-7.5 mg by mouth 2 (two) times a week. Pt takes 2 tablets every Tuesday and Wednesday, Disp: , Rfl: 1 .  metoprolol succinate (TOPROL XL) 25 MG 24 hr tablet, Take 1 tablet (25 mg total) by mouth 2 (two) times daily. To take along with metoprolol XL 100mg  for a total of 125 mg BID, Disp: 180 tablet, Rfl: 3 .  metoprolol succinate (TOPROL-XL) 100 MG 24 hr tablet, Take 1 tablet (100 mg total) by mouth 2 (two) times daily. Take with or immediately following a meal., Disp: 180 tablet, Rfl: 3 .  Multiple Vitamins-Minerals (MULTIVITAMIN WITH MINERALS) tablet, Take 1 tablet by mouth daily., Disp: , Rfl:  .  potassium chloride (K-DUR,KLOR-CON) 10 MEQ tablet, Take 2 tablets (20 mEq total) by mouth daily., Disp: 60 tablet, Rfl: 1 .  pravastatin (PRAVACHOL) 20 MG tablet, Take 20 mg by mouth at bedtime., Disp: , Rfl:  .  predniSONE (DELTASONE) 2.5 MG tablet, Take 2.5 mg by mouth daily with breakfast. , Disp: , Rfl:  .  ranitidine (ZANTAC) 150 MG tablet, Take 1 tablet (150 mg total) by mouth at bedtime., Disp: 30 tablet, Rfl: 0 .  verapamil (CALAN) 120 MG tablet, Take 1 tablet (120 mg total) by mouth every 12 (twelve) hours., Disp: 60 tablet, Rfl: 0 .  warfarin (COUMADIN) 5 MG tablet, Take 1 tablet (5 mg  total) by mouth daily., Disp: 90 tablet, Rfl: 3

## 2016-07-04 NOTE — Patient Instructions (Addendum)
For your allergic rhinitis: Use Neil Med rinses with distilled water at least twice per day using the instructions on the package. 1/2 hour after using the [facility] Med rinse, use Nasacort two puffs in each nostril once per day.  Remember that the Nasacort can take 1-2 weeks to work after regular use. Use generic zyrtec (cetirizine) every day.  If this doesn't help, then stop taking it and use chlorpheniramine-phenylephrine combination tablets.  For your asthma: This seems to be stable right now Let us know if it is worsening.  For your heart burn: He taking ranitidine If her cough gets worse though he can take omeprazole for a few weeks  For your shortness of breath: Exercise regularly Let us know if it gets worse  We will see you back if any the symptoms are worsening.

## 2016-07-06 ENCOUNTER — Ambulatory Visit (INDEPENDENT_AMBULATORY_CARE_PROVIDER_SITE_OTHER): Payer: Medicare Other | Admitting: *Deleted

## 2016-07-06 DIAGNOSIS — Z7901 Long term (current) use of anticoagulants: Secondary | ICD-10-CM

## 2016-07-06 DIAGNOSIS — Z5181 Encounter for therapeutic drug level monitoring: Secondary | ICD-10-CM

## 2016-07-06 DIAGNOSIS — I4891 Unspecified atrial fibrillation: Secondary | ICD-10-CM | POA: Diagnosis not present

## 2016-07-06 LAB — POCT INR: INR: 1.6

## 2016-07-24 ENCOUNTER — Ambulatory Visit (INDEPENDENT_AMBULATORY_CARE_PROVIDER_SITE_OTHER): Payer: Medicare Other

## 2016-07-24 DIAGNOSIS — I4891 Unspecified atrial fibrillation: Secondary | ICD-10-CM | POA: Diagnosis not present

## 2016-07-24 DIAGNOSIS — Z7901 Long term (current) use of anticoagulants: Secondary | ICD-10-CM

## 2016-07-24 DIAGNOSIS — Z5181 Encounter for therapeutic drug level monitoring: Secondary | ICD-10-CM

## 2016-07-24 LAB — POCT INR: INR: 1.4

## 2016-08-03 ENCOUNTER — Ambulatory Visit (INDEPENDENT_AMBULATORY_CARE_PROVIDER_SITE_OTHER): Payer: Medicare Other | Admitting: *Deleted

## 2016-08-03 DIAGNOSIS — Z5181 Encounter for therapeutic drug level monitoring: Secondary | ICD-10-CM

## 2016-08-03 DIAGNOSIS — Z7901 Long term (current) use of anticoagulants: Secondary | ICD-10-CM | POA: Diagnosis not present

## 2016-08-03 DIAGNOSIS — I4891 Unspecified atrial fibrillation: Secondary | ICD-10-CM

## 2016-08-03 LAB — POCT INR: INR: 1.9

## 2016-08-05 ENCOUNTER — Other Ambulatory Visit: Payer: Self-pay | Admitting: Interventional Cardiology

## 2016-08-17 ENCOUNTER — Ambulatory Visit (INDEPENDENT_AMBULATORY_CARE_PROVIDER_SITE_OTHER): Payer: Medicare Other | Admitting: *Deleted

## 2016-08-17 DIAGNOSIS — Z5181 Encounter for therapeutic drug level monitoring: Secondary | ICD-10-CM

## 2016-08-17 DIAGNOSIS — Z7901 Long term (current) use of anticoagulants: Secondary | ICD-10-CM | POA: Diagnosis not present

## 2016-08-17 DIAGNOSIS — I4891 Unspecified atrial fibrillation: Secondary | ICD-10-CM | POA: Diagnosis not present

## 2016-08-17 LAB — POCT INR: INR: 1.9

## 2016-08-31 ENCOUNTER — Ambulatory Visit (INDEPENDENT_AMBULATORY_CARE_PROVIDER_SITE_OTHER): Payer: Medicare Other | Admitting: *Deleted

## 2016-08-31 DIAGNOSIS — Z5181 Encounter for therapeutic drug level monitoring: Secondary | ICD-10-CM

## 2016-08-31 DIAGNOSIS — I4891 Unspecified atrial fibrillation: Secondary | ICD-10-CM | POA: Diagnosis not present

## 2016-08-31 DIAGNOSIS — Z7901 Long term (current) use of anticoagulants: Secondary | ICD-10-CM

## 2016-08-31 LAB — POCT INR: INR: 2.9

## 2016-09-14 ENCOUNTER — Ambulatory Visit (INDEPENDENT_AMBULATORY_CARE_PROVIDER_SITE_OTHER): Payer: Medicare Other

## 2016-09-14 DIAGNOSIS — I4891 Unspecified atrial fibrillation: Secondary | ICD-10-CM | POA: Diagnosis not present

## 2016-09-14 DIAGNOSIS — Z7901 Long term (current) use of anticoagulants: Secondary | ICD-10-CM | POA: Diagnosis not present

## 2016-09-14 DIAGNOSIS — Z5181 Encounter for therapeutic drug level monitoring: Secondary | ICD-10-CM | POA: Diagnosis not present

## 2016-09-14 LAB — POCT INR: INR: 3.7

## 2016-10-02 ENCOUNTER — Ambulatory Visit (INDEPENDENT_AMBULATORY_CARE_PROVIDER_SITE_OTHER): Payer: Medicare Other | Admitting: *Deleted

## 2016-10-02 DIAGNOSIS — Z7901 Long term (current) use of anticoagulants: Secondary | ICD-10-CM | POA: Diagnosis not present

## 2016-10-02 DIAGNOSIS — I4891 Unspecified atrial fibrillation: Secondary | ICD-10-CM

## 2016-10-02 DIAGNOSIS — Z5181 Encounter for therapeutic drug level monitoring: Secondary | ICD-10-CM

## 2016-10-02 LAB — POCT INR: INR: 2.7

## 2016-10-16 ENCOUNTER — Ambulatory Visit (INDEPENDENT_AMBULATORY_CARE_PROVIDER_SITE_OTHER): Payer: Medicare Other | Admitting: Pharmacist Clinician (PhC)/ Clinical Pharmacy Specialist

## 2016-10-16 DIAGNOSIS — Z5181 Encounter for therapeutic drug level monitoring: Secondary | ICD-10-CM | POA: Diagnosis not present

## 2016-10-16 DIAGNOSIS — Z7901 Long term (current) use of anticoagulants: Secondary | ICD-10-CM

## 2016-10-16 DIAGNOSIS — I4891 Unspecified atrial fibrillation: Secondary | ICD-10-CM | POA: Diagnosis not present

## 2016-10-16 LAB — POCT INR: INR: 3

## 2016-10-25 ENCOUNTER — Other Ambulatory Visit: Payer: Self-pay | Admitting: Interventional Cardiology

## 2016-11-06 ENCOUNTER — Ambulatory Visit (INDEPENDENT_AMBULATORY_CARE_PROVIDER_SITE_OTHER): Payer: Medicare Other | Admitting: *Deleted

## 2016-11-06 DIAGNOSIS — Z7901 Long term (current) use of anticoagulants: Secondary | ICD-10-CM

## 2016-11-06 DIAGNOSIS — Z5181 Encounter for therapeutic drug level monitoring: Secondary | ICD-10-CM

## 2016-11-06 DIAGNOSIS — I482 Chronic atrial fibrillation, unspecified: Secondary | ICD-10-CM

## 2016-11-06 DIAGNOSIS — I4891 Unspecified atrial fibrillation: Secondary | ICD-10-CM

## 2016-11-06 LAB — POCT INR: INR: 4.1

## 2016-11-20 ENCOUNTER — Ambulatory Visit (INDEPENDENT_AMBULATORY_CARE_PROVIDER_SITE_OTHER): Payer: Medicare Other | Admitting: *Deleted

## 2016-11-20 DIAGNOSIS — Z5181 Encounter for therapeutic drug level monitoring: Secondary | ICD-10-CM | POA: Diagnosis not present

## 2016-11-20 DIAGNOSIS — Z7901 Long term (current) use of anticoagulants: Secondary | ICD-10-CM | POA: Diagnosis not present

## 2016-11-20 DIAGNOSIS — I4891 Unspecified atrial fibrillation: Secondary | ICD-10-CM | POA: Diagnosis not present

## 2016-11-20 LAB — PROTIME-INR
INR: 2.4 — ABNORMAL HIGH (ref 0.8–1.2)
Prothrombin Time: 25.7 s — ABNORMAL HIGH (ref 9.1–12.0)

## 2016-12-04 ENCOUNTER — Ambulatory Visit (INDEPENDENT_AMBULATORY_CARE_PROVIDER_SITE_OTHER): Payer: Medicare Other | Admitting: *Deleted

## 2016-12-04 DIAGNOSIS — Z5181 Encounter for therapeutic drug level monitoring: Secondary | ICD-10-CM

## 2016-12-04 DIAGNOSIS — Z7901 Long term (current) use of anticoagulants: Secondary | ICD-10-CM | POA: Diagnosis not present

## 2016-12-04 DIAGNOSIS — I4891 Unspecified atrial fibrillation: Secondary | ICD-10-CM | POA: Diagnosis not present

## 2016-12-04 LAB — POCT INR: INR: 3

## 2017-01-05 ENCOUNTER — Ambulatory Visit (INDEPENDENT_AMBULATORY_CARE_PROVIDER_SITE_OTHER): Payer: Medicare Other | Admitting: *Deleted

## 2017-01-05 DIAGNOSIS — Z7901 Long term (current) use of anticoagulants: Secondary | ICD-10-CM

## 2017-01-05 DIAGNOSIS — Z5181 Encounter for therapeutic drug level monitoring: Secondary | ICD-10-CM | POA: Diagnosis not present

## 2017-01-05 DIAGNOSIS — I4891 Unspecified atrial fibrillation: Secondary | ICD-10-CM | POA: Diagnosis not present

## 2017-01-05 LAB — POCT INR: INR: 2.8

## 2017-01-05 NOTE — Patient Instructions (Addendum)
  Description   Continue taking 1 tablet  daily except 1.5 tablets only on  Mondays.  Recheck in 4 weeks.  Call us with any medication changes or bleeding concerns, [phone], Main [phone].

## 2017-02-01 ENCOUNTER — Other Ambulatory Visit: Payer: Self-pay | Admitting: Interventional Cardiology

## 2017-02-02 ENCOUNTER — Ambulatory Visit (INDEPENDENT_AMBULATORY_CARE_PROVIDER_SITE_OTHER): Payer: Medicare Other | Admitting: *Deleted

## 2017-02-02 DIAGNOSIS — I4891 Unspecified atrial fibrillation: Secondary | ICD-10-CM | POA: Diagnosis not present

## 2017-02-02 DIAGNOSIS — I482 Chronic atrial fibrillation, unspecified: Secondary | ICD-10-CM

## 2017-02-02 DIAGNOSIS — Z5181 Encounter for therapeutic drug level monitoring: Secondary | ICD-10-CM

## 2017-02-02 DIAGNOSIS — Z7901 Long term (current) use of anticoagulants: Secondary | ICD-10-CM | POA: Diagnosis not present

## 2017-02-02 LAB — POCT INR: INR: 4.2

## 2017-02-02 NOTE — Patient Instructions (Signed)
Description   Do not take coumadin today Jan 18th then tomorrow Jan 19th take only 1/2 tablet (2.5mg ) then continue taking 1 tablet  daily except 1.5 tablets only on  Mondays.  Recheck in 2 weeks.  Call us with any medication changes or bleeding concerns, [phone], Main [phone].

## 2017-02-16 ENCOUNTER — Ambulatory Visit (INDEPENDENT_AMBULATORY_CARE_PROVIDER_SITE_OTHER): Payer: Medicare Other | Admitting: *Deleted

## 2017-02-16 DIAGNOSIS — I4891 Unspecified atrial fibrillation: Secondary | ICD-10-CM | POA: Diagnosis not present

## 2017-02-16 DIAGNOSIS — I482 Chronic atrial fibrillation, unspecified: Secondary | ICD-10-CM

## 2017-02-16 DIAGNOSIS — Z7901 Long term (current) use of anticoagulants: Secondary | ICD-10-CM | POA: Diagnosis not present

## 2017-02-16 DIAGNOSIS — Z5181 Encounter for therapeutic drug level monitoring: Secondary | ICD-10-CM | POA: Diagnosis not present

## 2017-02-16 LAB — POCT INR: INR: 2.7

## 2017-02-16 NOTE — Patient Instructions (Signed)
Description   Continue taking 1 tablet  daily except 1.5 tablets only on  Mondays.  Recheck in 3 weeks.  Call us with any medication changes or bleeding concerns, [phone], Main [phone].

## 2017-03-08 ENCOUNTER — Ambulatory Visit (INDEPENDENT_AMBULATORY_CARE_PROVIDER_SITE_OTHER): Payer: Medicare Other | Admitting: *Deleted

## 2017-03-08 DIAGNOSIS — I4891 Unspecified atrial fibrillation: Secondary | ICD-10-CM | POA: Diagnosis not present

## 2017-03-08 DIAGNOSIS — Z5181 Encounter for therapeutic drug level monitoring: Secondary | ICD-10-CM | POA: Diagnosis not present

## 2017-03-08 DIAGNOSIS — Z7901 Long term (current) use of anticoagulants: Secondary | ICD-10-CM | POA: Diagnosis not present

## 2017-03-08 LAB — POCT INR: INR: 2.4

## 2017-03-08 NOTE — Patient Instructions (Signed)
Description   Continue taking 1 tablet daily except 1.5 tablets only on Mondays. Recheck in 4 weeks. Call us with any medication changes or bleeding concerns, [phone], Main [phone].

## 2017-03-19 ENCOUNTER — Ambulatory Visit (INDEPENDENT_AMBULATORY_CARE_PROVIDER_SITE_OTHER): Payer: Medicare Other | Admitting: Interventional Cardiology

## 2017-03-19 ENCOUNTER — Encounter: Payer: Self-pay | Admitting: Interventional Cardiology

## 2017-03-19 VITALS — BP 114/70 | HR 76 | Ht 63.0 in | Wt 154.2 lb

## 2017-03-19 DIAGNOSIS — I482 Chronic atrial fibrillation: Secondary | ICD-10-CM | POA: Diagnosis not present

## 2017-03-19 DIAGNOSIS — I10 Essential (primary) hypertension: Secondary | ICD-10-CM | POA: Diagnosis not present

## 2017-03-19 DIAGNOSIS — I4821 Permanent atrial fibrillation: Secondary | ICD-10-CM

## 2017-03-19 DIAGNOSIS — R0609 Other forms of dyspnea: Secondary | ICD-10-CM | POA: Diagnosis not present

## 2017-03-19 NOTE — Progress Notes (Signed)
Cardiology Office Note   Date:  03/19/2017   ID:  [name], DOB Sep 17, 1940, MRN [id number]  PCP:  Leeroy Cha, MD    No chief complaint on file.  AFib  Wt Readings from Last 3 Encounters:  03/19/17 154 lb 3.2 oz (69.9 kg)  07/04/16 147 lb (66.7 kg)  05/24/16 148 lb 1.9 oz (67.2 kg)       History of Present Illness: [name] is a 77 y.o. female  with a hx of chronic AF, RA, HTN, HL. She is on Coumadin for anticoagulation.   Cardiac cath in 2012 showed no significant CAD.  She was admitted in 4/17 with chest pain. Echo demonstrated normal LVEF with mod pulmonary HTN (PASP 48 mmHg). CEs remained neg. No further ischemic workup was pursued. She was then readmitted 5/17 with HCAP with assoc moderate pleural effusion. She underwent left-sided thoracentesis. Effusion was felt to be exudative and concerning for infectious-empyema. Cytology was negative for malignancy.   She had a pericardial effusion in 11/17.  THis was drained and was bloody.    She has had persistent DOE and occasional palpitations, worse if she walks and more with emotional stress.  Denies : Chest pain. Dizziness. Leg edema. Nitroglycerin use. Orthopnea. Palpitations. Paroxysmal nocturnal dyspnea. Shortness of breath. Syncope.   Walking is limited by overall deconditioning, unsteadiness and vertigo which is chronic.      Past Medical History:  Diagnosis Date  . Acne rosacea   . Adhesive capsulitis of left shoulder 1980  . Allergic rhinitis   . Chronic atrial fibrillation ([place])    a. Coumadin anticoagulation - [facility] ([place])  . Chronic bronchitis ([place])   . Esophageal reflux   . Frequent epistaxis    "cause of my coumadin" (12/16/2015)  . Heart murmur   . History of echocardiogram    a. Echo 4/17:  EF 55-60%, no RWMA, mild MR, mild LAE, PASP 48 mmHg, trivial effusion post to heart  . Hypercholesterolemia   . Hyperlipidemia   . Hypertension   .  Osteopenia   . Pleural effusion associated with pulmonary infection    Admx 5/17 >> required L thoracentesis (cytology neg for malignancy)   . Pneumonia    "I've had it 4 times this year" (12/16/2015)  . Polymorphic light eruption 2004  . Prolonged QT interval   . Pulmonary HTN ([place])   . Respiratory failure ([place]) 04/2015   "spent 3 days; went home; then another 13 days in hospital" (12/16/2015  . Rheumatoid arthritis ([place]) 1975  . Right middle ear infection 1986  . Skin cancer    "burndt it off at the right side of my nose/face" (12/16/2015)    Past Surgical History:  Procedure Laterality Date  . BREAST BIOPSY Left 2000s X 2  . CARDIAC CATHETERIZATION  03/2010   a. [place] 2/09: no ischemia; low risk  //  b. [place] 3/12: normal coronary arteries  . CARDIAC CATHETERIZATION N/A 12/16/2015   Procedure: Pericardiocentesis;  Surgeon: Nelva Bush, MD;  Location: [facility];  Service: Cardiovascular;  Laterality: N/A;  . CARDIOVERSION  04/2010  . TEMPOROMANDIBULAR JOINT SURGERY Bilateral   . TOTAL ABDOMINAL HYSTERECTOMY       Current Outpatient Medications  Medication Sig Dispense Refill  . acidophilus (RISAQUAD) CAPS capsule Take 1 capsule by mouth daily.    Marland Kitchen albuterol (PROVENTIL) (2.5 MG/3ML) 0.083% nebulizer solution Take 3 mLs (2.5 mg total) by nebulization every 6 (six) hours as needed  for wheezing or shortness of breath. 75 mL 0  . cholecalciferol (VITAMIN D) 1000 UNITS tablet Take 2,000 Units by mouth daily.    Marland Kitchen etanercept (ENBREL) 50 MG/ML injection Inject 50 mg as directed once a week.    . fluticasone (FLONASE) 50 MCG/ACT nasal spray Place 2 sprays into both nostrils daily as needed for rhinitis.     . folic acid (FOLVITE) 1 MG tablet Take 2 mg by mouth daily.     . furosemide (LASIX) 40 MG tablet TAKE 1 TABLET BY MOUTH EVERY DAY OR AS DIRECTED 90 tablet 1  . guaiFENesin (MUCINEX) 600 MG 12 hr tablet Take 600 mg by mouth 2 (two) times daily as needed for cough or  to loosen phlegm.     . methotrexate (RHEUMATREX) 2.5 MG tablet Take 5-7.5 mg by mouth 2 (two) times a week. Pt takes 2 tablets every Tuesday and Wednesday  1  . metoprolol succinate (TOPROL XL) 25 MG 24 hr tablet Take 1 tablet (25 mg total) by mouth 2 (two) times daily. To take along with metoprolol XL 100mg  for a total of 125 mg BID 180 tablet 3  . metoprolol succinate (TOPROL-XL) 100 MG 24 hr tablet TAKE 1 TABLET BY MOUTH TWICE DAILY, TAKE WITH OR IMMEDIATELY FOLLOWING A MEAL 180 tablet 0  . Multiple Vitamins-Minerals (MULTIVITAMIN WITH MINERALS) tablet Take 1 tablet by mouth daily.    . potassium chloride (K-DUR,KLOR-CON) 10 MEQ tablet Take 2 tablets (20 mEq total) by mouth daily. 60 tablet 1  . pravastatin (PRAVACHOL) 20 MG tablet Take 20 mg by mouth at bedtime.    . predniSONE (DELTASONE) 2.5 MG tablet Take 2.5 mg by mouth daily with breakfast.     . ranitidine (ZANTAC) 150 MG tablet Take 1 tablet (150 mg total) by mouth at bedtime. 30 tablet 0  . traMADol (ULTRAM) 50 MG tablet Take 50 mg by mouth as needed for pain.  0  . verapamil (CALAN) 120 MG tablet Take 1 tablet (120 mg total) by mouth every 12 (twelve) hours. 60 tablet 0  . warfarin (COUMADIN) 5 MG tablet TAKE 1 TABLET BY MOUTH DAILY OR AS DIRECTED 100 tablet 1   No current facility-administered medications for this visit.     Allergies:   Arthrotec [diclofenac-misoprostol]; Erythromycin; Morphine sulfate; Ciprofloxacin; Morphine; Amoxicillin; Diltiazem hcl; Gold-containing drug products; Macrodantin [nitrofurantoin macrocrystal]; Naproxen; and Penicillins    Social History:  The patient  reports that  has never smoked. she has never used smokeless tobacco. She reports that she does not drink alcohol or use drugs.   Family History:  The patient's family history includes Healthy in her sister; Heart disease in her father, mother, sister, and sister; Other in her mother.    ROS:  Please see the history of present illness.    Otherwise, review of systems are positive for feeling better than usual; vertigo.   All other systems are reviewed and negative.    PHYSICAL EXAM: VS:  BP 114/70   Pulse 76   Ht 5\' 3"  (1.6 m)   Wt 154 lb 3.2 oz (69.9 kg)   SpO2 99%   BMI 27.32 kg/m  , BMI Body mass index is 27.32 kg/m. GEN: Well nourished, well developed, in no acute distress  HEENT: normal  Neck: no JVD, carotid bruits, or masses Cardiac: irregularly irregular; no murmurs, rubs, or gallops,no edema  Respiratory:  clear to auscultation bilaterally, normal work of breathing GI: soft, nontender, nondistended, + BS MS: no deformity  or atrophy  Skin: warm and dry, no rash Neuro:  Strength and sensation are intact Psych: euthymic mood, full affect   EKG:   The ekg ordered today demonstrates AFib, rate controlled.   Recent Labs: No results found for requested labs within last 8760 hours.   Lipid Panel    Component Value Date/Time   CHOL 134 05/24/2015 1344   TRIG 148.0 05/21/2013 0846   HDL 36.60 (L) 05/21/2013 0846   CHOLHDL 4 05/21/2013 0846   VLDL 29.6 05/21/2013 0846   LDLCALC 79 05/21/2013 0846     Other studies Reviewed: Additional studies/ records that were reviewed today with results demonstrating: TC 205, HDL 89 in 6/19..   ASSESSMENT AND PLAN:  1. AFib: rate controlled. Coumadin for stroke prevention.  Was stopped temporarily for pericardial effusion. Restarted in Jan 2018.  Intolerant of diltiazem due to leg swelling. 2. HTN: Well controlled today 3. DOE: stable.  Encouraged exercise that does not put her at risk of falling.    Current medicines are reviewed at length with the patient today.  The patient concerns regarding her medicines were addressed.  The following changes have been made:  No change  Labs/ tests ordered today include:  No orders of the defined types were placed in this encounter.   Recommend 150 minutes/week of aerobic exercise Low fat, low carb, high fiber diet  recommended  Disposition:   FU in 9 months   Signed, Larae Grooms, MD  03/19/2017 [facility] [address], [place], [place]  [address] Phone: [phone]; Fax: [phone]

## 2017-03-19 NOTE — Patient Instructions (Signed)
Medication Instructions:  Your physician recommends that you continue on your current medications as directed. Please refer to the Current Medication list given to you today.   Labwork: None ordered  Testing/Procedures: None ordered  Follow-Up: Your physician wants you to follow-up in: 9 months with Dr. Varanasi. You will receive a reminder letter in the mail two months in advance. If you don't receive a letter, please call our office to schedule the follow-up appointment.   Any Other Special Instructions Will Be Listed Below (If Applicable).     If you need a refill on your cardiac medications before your next appointment, please call your pharmacy.   

## 2017-04-05 ENCOUNTER — Ambulatory Visit (INDEPENDENT_AMBULATORY_CARE_PROVIDER_SITE_OTHER): Payer: Medicare Other | Admitting: *Deleted

## 2017-04-05 DIAGNOSIS — I4891 Unspecified atrial fibrillation: Secondary | ICD-10-CM

## 2017-04-05 DIAGNOSIS — Z7901 Long term (current) use of anticoagulants: Secondary | ICD-10-CM

## 2017-04-05 DIAGNOSIS — Z5181 Encounter for therapeutic drug level monitoring: Secondary | ICD-10-CM | POA: Diagnosis not present

## 2017-04-05 LAB — POCT INR: INR: 1.7

## 2017-04-05 NOTE — Patient Instructions (Signed)
Description   Take 1.5 tablets today, then continue taking 1 tablet daily except 1.5 tablets only on Mondays. Recheck in 3 weeks. Call us with any medication changes or bleeding concerns, [phone], Main [phone].

## 2017-04-26 ENCOUNTER — Ambulatory Visit (INDEPENDENT_AMBULATORY_CARE_PROVIDER_SITE_OTHER): Payer: Medicare Other | Admitting: *Deleted

## 2017-04-26 DIAGNOSIS — Z5181 Encounter for therapeutic drug level monitoring: Secondary | ICD-10-CM

## 2017-04-26 DIAGNOSIS — Z7901 Long term (current) use of anticoagulants: Secondary | ICD-10-CM

## 2017-04-26 DIAGNOSIS — I4891 Unspecified atrial fibrillation: Secondary | ICD-10-CM

## 2017-04-26 LAB — POCT INR: INR: 4.2

## 2017-04-26 NOTE — Patient Instructions (Signed)
Description   Do not take any Coumadin today and take 1/2 tablet tomorrow then continue taking 1 tablet daily except 1.5 tablets only on Mondays. Recheck in 2 weeks. Call us with any medication changes or bleeding concerns, [phone], Main [phone].

## 2017-04-27 ENCOUNTER — Other Ambulatory Visit: Payer: Self-pay | Admitting: Interventional Cardiology

## 2017-05-10 ENCOUNTER — Ambulatory Visit (INDEPENDENT_AMBULATORY_CARE_PROVIDER_SITE_OTHER): Payer: Medicare Other | Admitting: *Deleted

## 2017-05-10 DIAGNOSIS — I4891 Unspecified atrial fibrillation: Secondary | ICD-10-CM

## 2017-05-10 DIAGNOSIS — Z5181 Encounter for therapeutic drug level monitoring: Secondary | ICD-10-CM

## 2017-05-10 DIAGNOSIS — Z7901 Long term (current) use of anticoagulants: Secondary | ICD-10-CM

## 2017-05-10 LAB — POCT INR: INR: 3.4

## 2017-05-10 NOTE — Patient Instructions (Signed)
Description   Do not take any Coumadin today, then change your dose to 1 tablet everyday. Keep eating your 2 servings of leafy green vegetable each week.  Recheck in 2 weeks. Call us with any medication changes or bleeding concerns, [phone], Main [phone].

## 2017-05-24 ENCOUNTER — Ambulatory Visit (INDEPENDENT_AMBULATORY_CARE_PROVIDER_SITE_OTHER): Payer: Medicare Other | Admitting: *Deleted

## 2017-05-24 DIAGNOSIS — I4891 Unspecified atrial fibrillation: Secondary | ICD-10-CM

## 2017-05-24 DIAGNOSIS — Z5181 Encounter for therapeutic drug level monitoring: Secondary | ICD-10-CM | POA: Diagnosis not present

## 2017-05-24 DIAGNOSIS — Z7901 Long term (current) use of anticoagulants: Secondary | ICD-10-CM | POA: Diagnosis not present

## 2017-05-24 DIAGNOSIS — I482 Chronic atrial fibrillation, unspecified: Secondary | ICD-10-CM

## 2017-05-24 LAB — POCT INR: INR: 2.5

## 2017-05-24 NOTE — Patient Instructions (Signed)
Description   Continue same  dose of coumadin  1 tablet everyday. Keep eating your 2 servings of leafy green vegetable each week.  Recheck in 3 weeks. Call us with any medication changes or bleeding concerns, [phone], Main [phone].

## 2017-06-14 ENCOUNTER — Ambulatory Visit (INDEPENDENT_AMBULATORY_CARE_PROVIDER_SITE_OTHER): Payer: Medicare Other | Admitting: *Deleted

## 2017-06-14 DIAGNOSIS — Z7901 Long term (current) use of anticoagulants: Secondary | ICD-10-CM

## 2017-06-14 DIAGNOSIS — Z5181 Encounter for therapeutic drug level monitoring: Secondary | ICD-10-CM | POA: Diagnosis not present

## 2017-06-14 DIAGNOSIS — I4891 Unspecified atrial fibrillation: Secondary | ICD-10-CM | POA: Diagnosis not present

## 2017-06-14 LAB — POCT INR: INR: 2.7 (ref 2.0–3.0)

## 2017-06-14 NOTE — Patient Instructions (Signed)
Description   Continue same dose of coumadin 1 tablet everyday. Keep eating your 2 servings of leafy green vegetable each week.  Recheck in 4 weeks. Call us with any medication changes or bleeding concerns, [phone], Main [phone].      

## 2017-06-23 IMAGING — CT CT HEAD W/O CM
3 of 6 series · 14 of 47 positions shown, 16 images · non-contrast
Comparison: 12/28/2015

CLINICAL DATA: Fever with altered mental status.

EXAM:
CT HEAD WITHOUT CONTRAST
TECHNIQUE: Contiguous axial images were obtained from the base of the skull
through the vertex without intravenous contrast.

[Series 2: head w/o · axial · non-contrast · 0.45mm/px · z∈[-182,-62]mm · 8 of 32 slices shown, 10 images]
[im 4/32  brain]
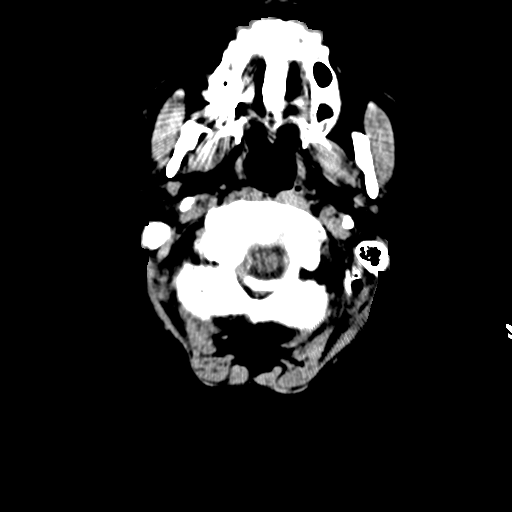
[im 4/32  bone]
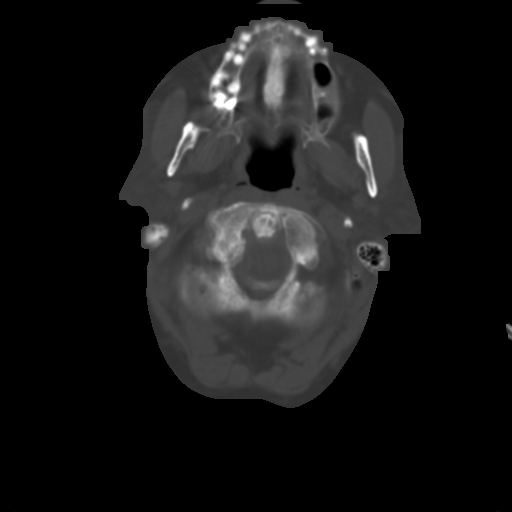
[im 7/32  brain]
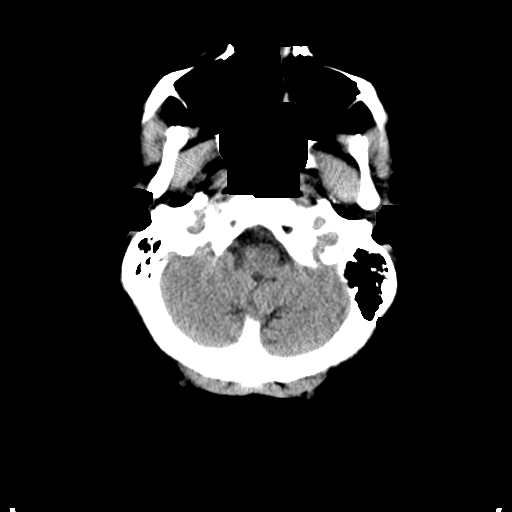
[im 10/32  brain]
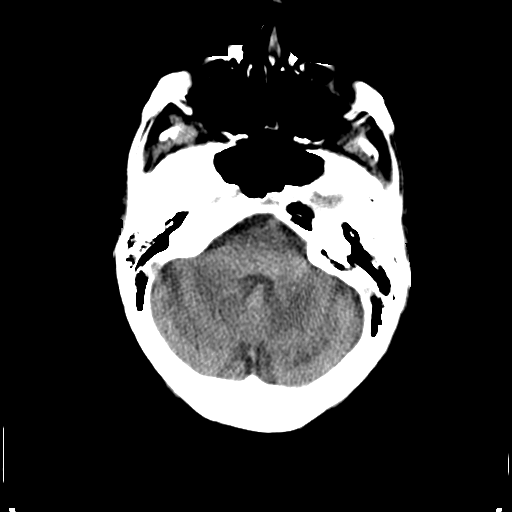
[im 13/32  brain]
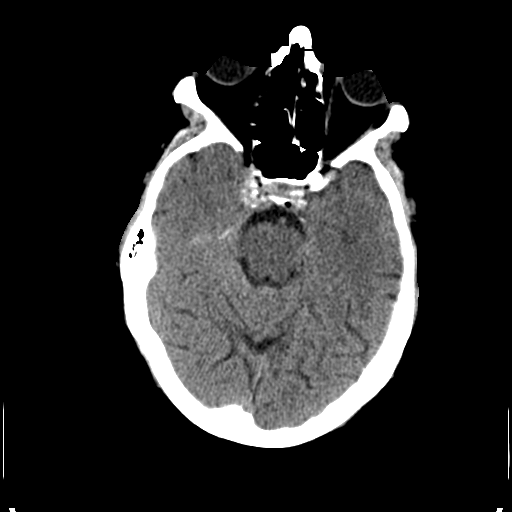
[im 19/32  brain]
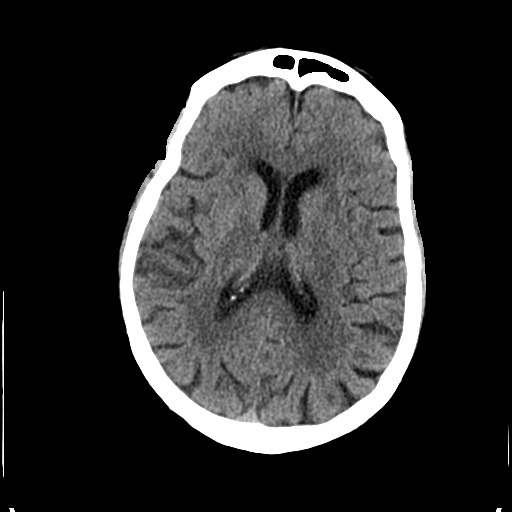
[im 19/32  bone]
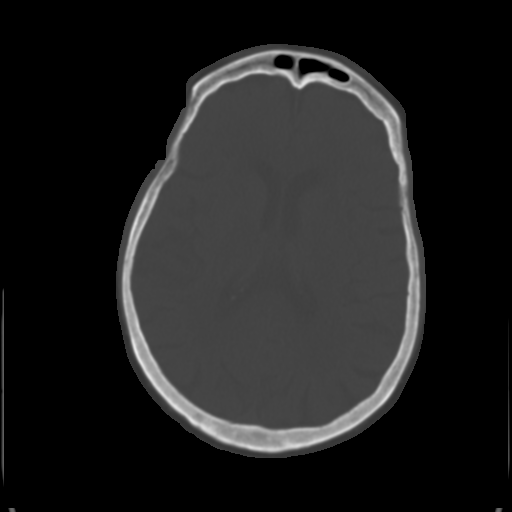
[im 22/32  brain]
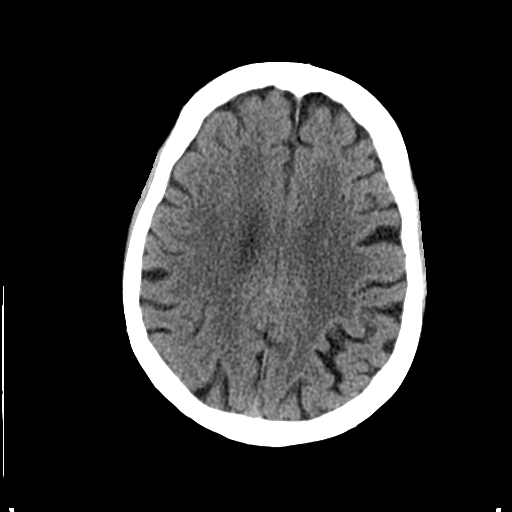
[im 25/32  brain]
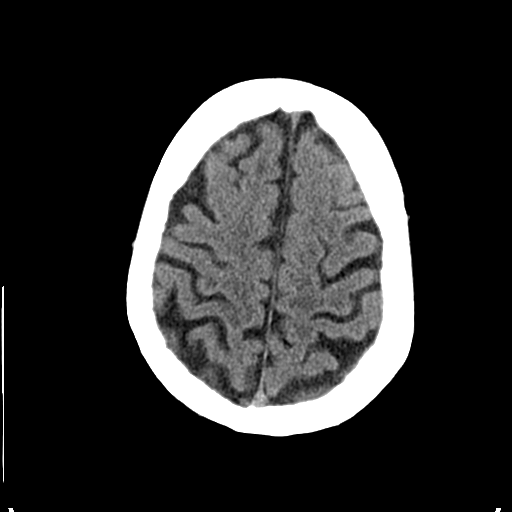
[im 28/32  brain]
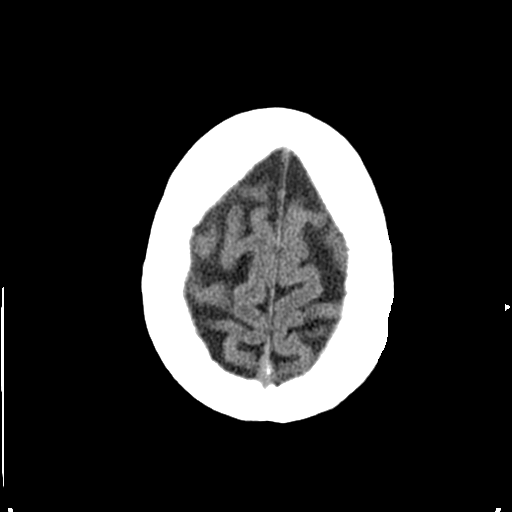

[Series 4: coronal · coronal · 0.31mm/px · 3 of 77 slices shown]
[im 26/77  brain]
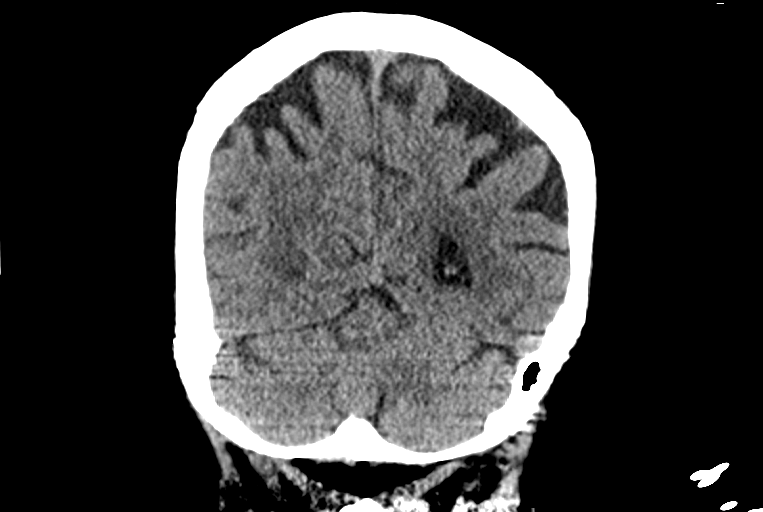
[im 34/77  brain]
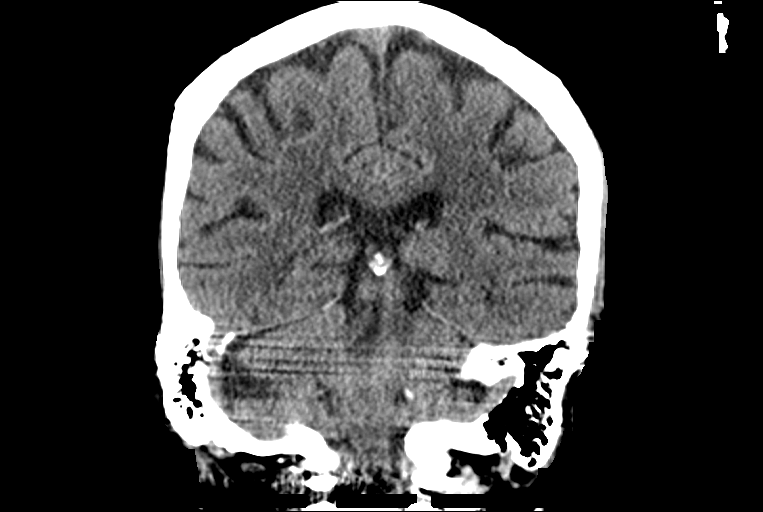
[im 43/77  brain]
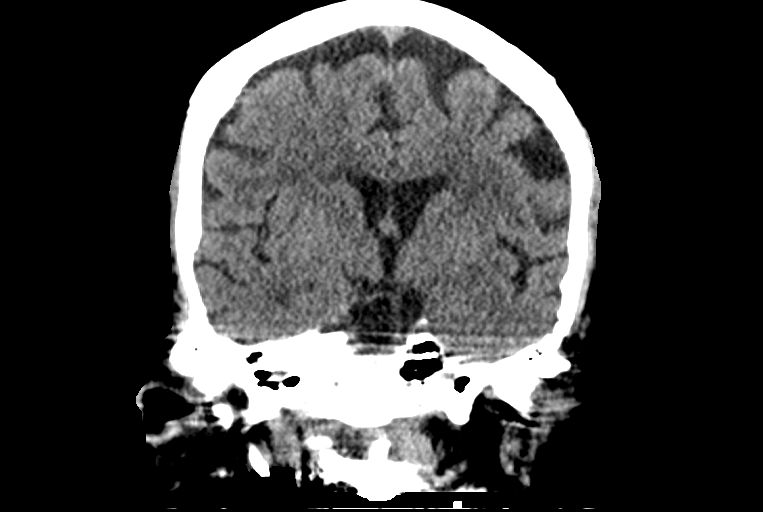

[Series 5: sagittal · sagittal · 0.31mm/px · 3 of 65 slices shown]
[im 22/65  brain]
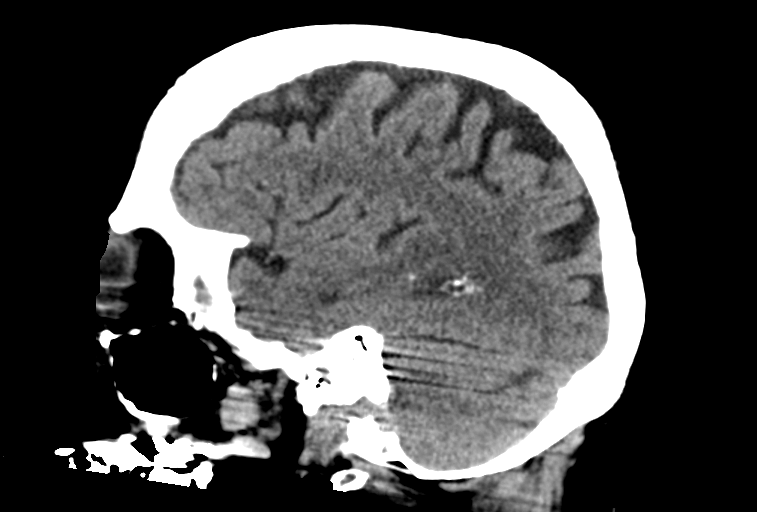
[im 33/65  brain]
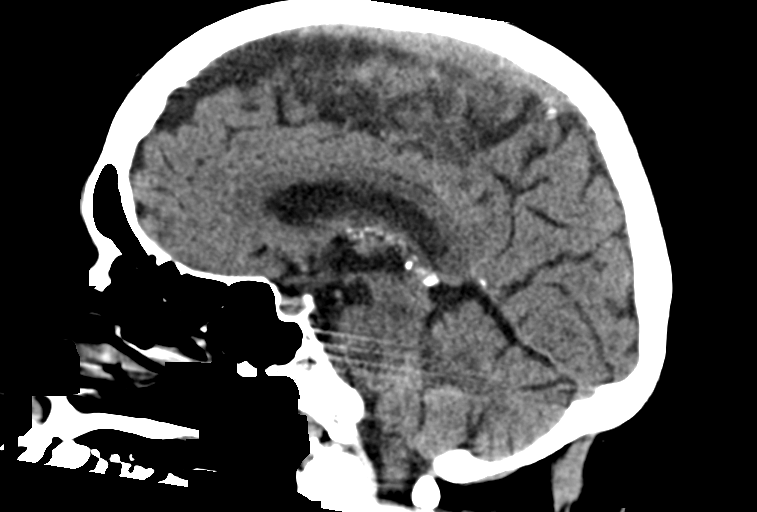
[im 43/65  brain]
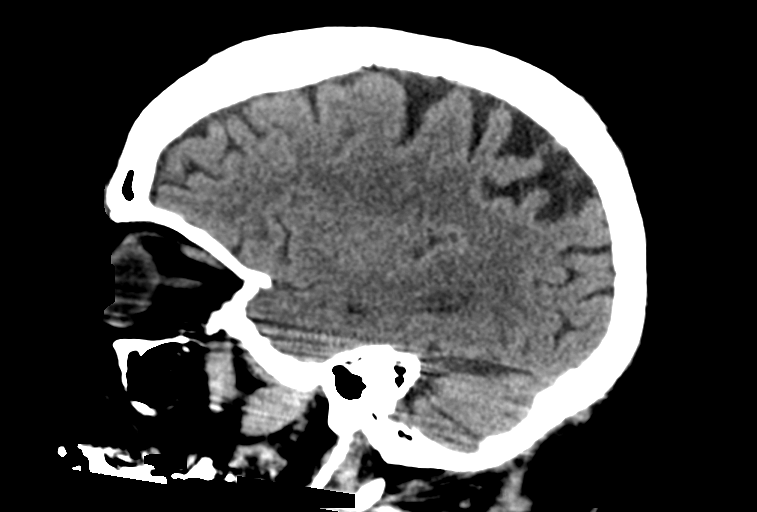

[14 of 47 positions shown; findings below may reference images not displayed]

FINDINGS: Brain: No evidence of acute infarction, hemorrhage, hydrocephalus,
extra-axial collection or mass lesion/mass effect. Stable mild brain
parenchymal atrophy and microangiopathic changes in the deep white
matter.

Vascular: No hyperdense vessel or unexpected calcification.

Skull: Normal. Negative for fracture or focal lesion.

Sinuses/Orbits: No acute finding.

Other: None.
IMPRESSION: No acute intracranial abnormality.

Stable mild brain parenchymal atrophy and chronic microvascular
disease.

## 2017-06-24 IMAGING — DX DG CHEST 1V PORT
1 series · 1 of 1 positions shown · non-contrast
Comparison: Chest radiograph dated 01/08/2016 and CT dated
01/08/2016

CLINICAL DATA: 75-year-old female with sepsis.

EXAM:
PORTABLE CHEST 1 VIEW

[chest ap]
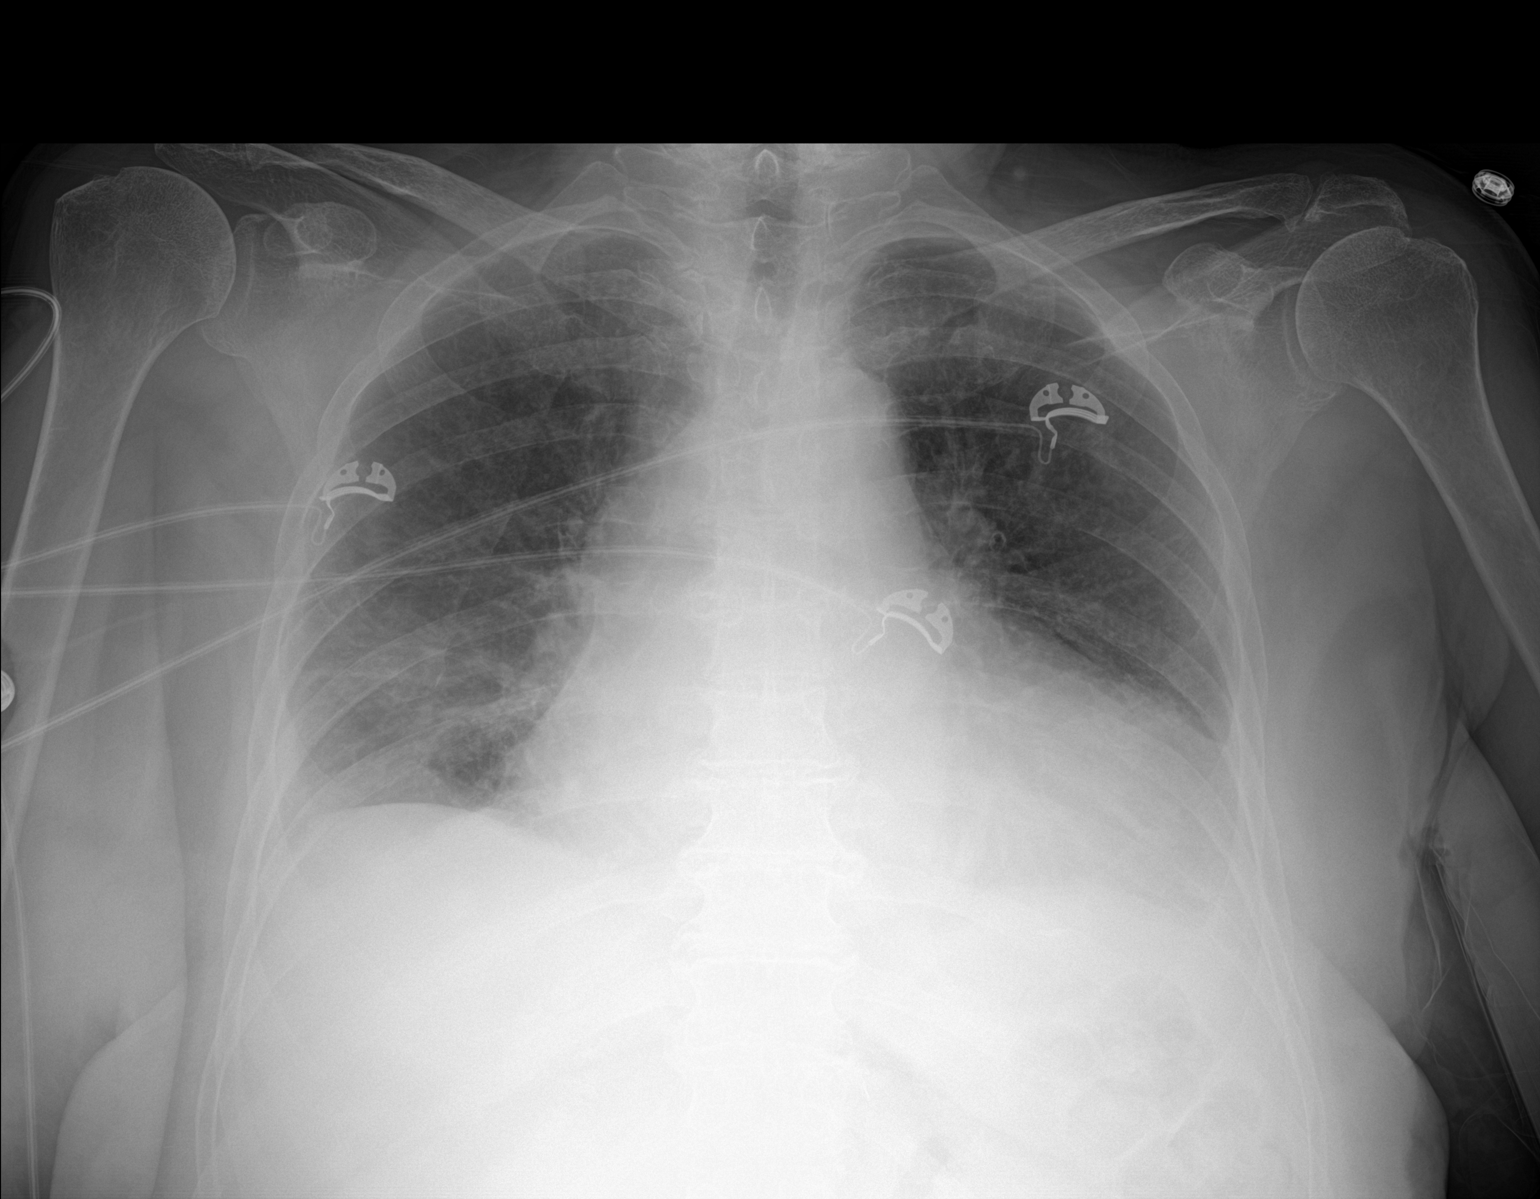

[1 of 1 positions shown; findings below may reference images not displayed]

FINDINGS: There are small bilateral pleural effusions, left greater right and
bibasilar hazy densities compatible with atelectasis versus
infiltrate. There is moderate cardiomegaly. No pneumothorax.
Osteopenia with degenerative changes of the spine. No acute osseous
pathology.
IMPRESSION: Small bilateral pleural effusions and bibasilar atelectasis versus
infiltrate.

Moderate cardiomegaly.

## 2017-06-25 IMAGING — DX DG CHEST 1V
1 series · 1 of 1 positions shown · non-contrast
Comparison: 01/09/2016

CLINICAL DATA: Cough.

EXAM:
CHEST 1 VIEW

[chest ap]
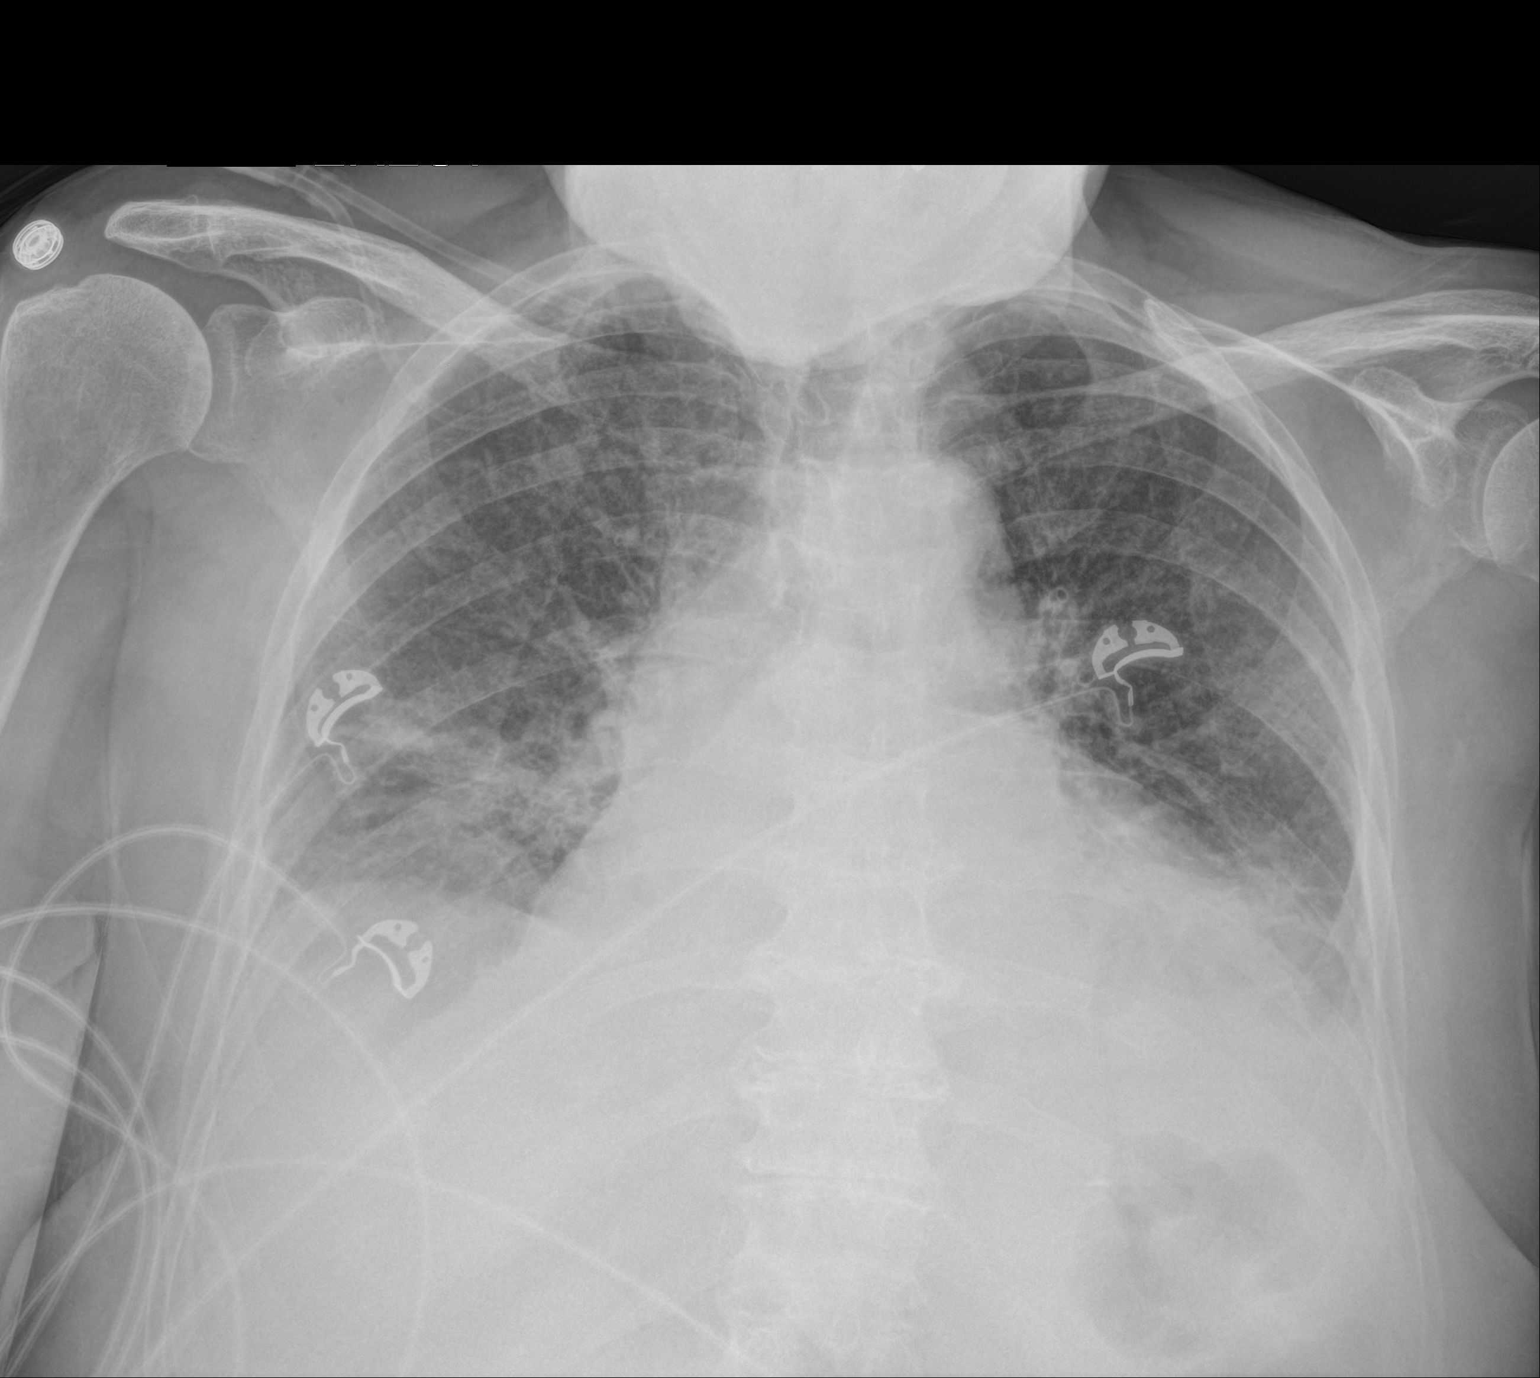

[1 of 1 positions shown; findings below may reference images not displayed]

FINDINGS: Enlargement of the cardiac silhouette is unchanged. Lung volumes are
diminished, more so than on the prior study. There is peribronchial
cuffing and diffuse interstitial accentuation which have increased
from the prior study. Patchy opacities in both lung bases have also
increased. Small pleural effusions persist. No pneumothorax is seen.
No acute osseous abnormality is identified.
IMPRESSION: 1. Cardiomegaly with likely mild interstitial edema and small
pleural effusions.
2. Low lung volumes with increased bibasilar opacities, likely
atelectasis.

## 2017-07-12 ENCOUNTER — Ambulatory Visit (INDEPENDENT_AMBULATORY_CARE_PROVIDER_SITE_OTHER): Payer: Medicare Other | Admitting: *Deleted

## 2017-07-12 DIAGNOSIS — Z5181 Encounter for therapeutic drug level monitoring: Secondary | ICD-10-CM | POA: Diagnosis not present

## 2017-07-12 DIAGNOSIS — I482 Chronic atrial fibrillation, unspecified: Secondary | ICD-10-CM

## 2017-07-12 DIAGNOSIS — I4891 Unspecified atrial fibrillation: Secondary | ICD-10-CM | POA: Diagnosis not present

## 2017-07-12 DIAGNOSIS — Z7901 Long term (current) use of anticoagulants: Secondary | ICD-10-CM

## 2017-07-12 LAB — POCT INR: INR: 2.5 (ref 2.0–3.0)

## 2017-07-12 NOTE — Patient Instructions (Signed)
Description   Continue same dose of coumadin 1 tablet everyday. Keep eating your 2 servings of leafy green vegetable each week.  Recheck in 4 weeks. Call us with any medication changes or bleeding concerns, [phone], Main [phone].

## 2017-08-02 ENCOUNTER — Other Ambulatory Visit: Payer: Self-pay | Admitting: Interventional Cardiology

## 2017-08-09 ENCOUNTER — Ambulatory Visit (INDEPENDENT_AMBULATORY_CARE_PROVIDER_SITE_OTHER): Payer: Medicare Other | Admitting: *Deleted

## 2017-08-09 DIAGNOSIS — Z7901 Long term (current) use of anticoagulants: Secondary | ICD-10-CM

## 2017-08-09 DIAGNOSIS — I4891 Unspecified atrial fibrillation: Secondary | ICD-10-CM | POA: Diagnosis not present

## 2017-08-09 DIAGNOSIS — Z5181 Encounter for therapeutic drug level monitoring: Secondary | ICD-10-CM

## 2017-08-09 LAB — POCT INR: INR: 3.2 — AB (ref 2.0–3.0)

## 2017-08-09 NOTE — Patient Instructions (Signed)
Description   Skip today's dose, then Continue same dose of coumadin 1 tablet everyday. Keep eating your 2 servings of leafy green vegetable each week.  Recheck in 3 weeks. Call us with any medication changes or bleeding concerns, [phone], Main [phone].

## 2017-08-30 ENCOUNTER — Ambulatory Visit (INDEPENDENT_AMBULATORY_CARE_PROVIDER_SITE_OTHER): Payer: Medicare Other

## 2017-08-30 DIAGNOSIS — Z7901 Long term (current) use of anticoagulants: Secondary | ICD-10-CM | POA: Diagnosis not present

## 2017-08-30 DIAGNOSIS — I4891 Unspecified atrial fibrillation: Secondary | ICD-10-CM | POA: Diagnosis not present

## 2017-08-30 DIAGNOSIS — Z5181 Encounter for therapeutic drug level monitoring: Secondary | ICD-10-CM

## 2017-08-30 LAB — POCT INR: INR: 3.7 — AB (ref 2.0–3.0)

## 2017-08-30 NOTE — Patient Instructions (Signed)
Description   Skip today's dosage of Coumadin, then start taking 1 tablet daily except 1/2 tablet on Mondays.   Recheck in 2 weeks. Call us with any medication changes or bleeding concerns, [phone], Main [phone].

## 2017-09-13 ENCOUNTER — Ambulatory Visit (INDEPENDENT_AMBULATORY_CARE_PROVIDER_SITE_OTHER): Payer: Medicare Other

## 2017-09-13 DIAGNOSIS — Z5181 Encounter for therapeutic drug level monitoring: Secondary | ICD-10-CM

## 2017-09-13 DIAGNOSIS — Z7901 Long term (current) use of anticoagulants: Secondary | ICD-10-CM

## 2017-09-13 DIAGNOSIS — I4891 Unspecified atrial fibrillation: Secondary | ICD-10-CM

## 2017-09-13 LAB — POCT INR: INR: 3.2 — AB (ref 2.0–3.0)

## 2017-09-13 NOTE — Patient Instructions (Signed)
Description   Take 1/2 tablet today, then start taking 1 tablet daily except 1/2 tablet on Mondays and Fridays.   Recheck in 2 weeks. Call us with any medication changes or bleeding concerns, [phone], Main [phone].

## 2017-10-08 ENCOUNTER — Ambulatory Visit (INDEPENDENT_AMBULATORY_CARE_PROVIDER_SITE_OTHER): Payer: Medicare Other

## 2017-10-08 DIAGNOSIS — Z5181 Encounter for therapeutic drug level monitoring: Secondary | ICD-10-CM

## 2017-10-08 DIAGNOSIS — I4891 Unspecified atrial fibrillation: Secondary | ICD-10-CM | POA: Diagnosis not present

## 2017-10-08 DIAGNOSIS — Z7901 Long term (current) use of anticoagulants: Secondary | ICD-10-CM

## 2017-10-08 LAB — POCT INR: INR: 2.4 (ref 2.0–3.0)

## 2017-10-08 NOTE — Patient Instructions (Signed)
Description   Continue on same dosage 1 tablet daily except 1/2 tablet on Mondays and Fridays.   Recheck in 4 weeks. Call us with any medication changes or bleeding concerns, [phone], Main [phone].      

## 2017-11-05 ENCOUNTER — Ambulatory Visit (INDEPENDENT_AMBULATORY_CARE_PROVIDER_SITE_OTHER): Payer: Medicare Other | Admitting: *Deleted

## 2017-11-05 ENCOUNTER — Encounter (INDEPENDENT_AMBULATORY_CARE_PROVIDER_SITE_OTHER): Payer: Self-pay

## 2017-11-05 DIAGNOSIS — Z5181 Encounter for therapeutic drug level monitoring: Secondary | ICD-10-CM | POA: Diagnosis not present

## 2017-11-05 DIAGNOSIS — I4891 Unspecified atrial fibrillation: Secondary | ICD-10-CM

## 2017-11-05 DIAGNOSIS — Z7901 Long term (current) use of anticoagulants: Secondary | ICD-10-CM | POA: Diagnosis not present

## 2017-11-05 LAB — POCT INR: INR: 2 (ref 2.0–3.0)

## 2017-11-05 NOTE — Patient Instructions (Signed)
Description   Continue on same dosage 1 tablet daily except 1/2 tablet on Mondays and Fridays.   Recheck in 4 weeks. Call us with any medication changes or bleeding concerns, [phone], Main [phone].      

## 2017-11-23 ENCOUNTER — Other Ambulatory Visit: Payer: Self-pay

## 2017-11-23 ENCOUNTER — Emergency Department (HOSPITAL_COMMUNITY)
Admission: EM | Admit: 2017-11-23 | Discharge: 2017-11-23 | Disposition: A | Discharge status: Home / Self Care | Payer: Medicare Other | Attending: Emergency Medicine | Admitting: Emergency Medicine

## 2017-11-23 DIAGNOSIS — I10 Essential (primary) hypertension: Secondary | ICD-10-CM | POA: Insufficient documentation

## 2017-11-23 DIAGNOSIS — Z79899 Other long term (current) drug therapy: Secondary | ICD-10-CM | POA: Diagnosis not present

## 2017-11-23 DIAGNOSIS — Z7901 Long term (current) use of anticoagulants: Secondary | ICD-10-CM | POA: Insufficient documentation

## 2017-11-23 DIAGNOSIS — R040 Epistaxis: Secondary | ICD-10-CM | POA: Insufficient documentation

## 2017-11-23 LAB — PROTIME-INR
INR: 2.31
Prothrombin Time: 25.1 seconds — ABNORMAL HIGH (ref 11.4–15.2)

## 2017-11-23 LAB — CBC WITH DIFFERENTIAL/PLATELET
Abs Immature Granulocytes: 0.02 10*3/uL (ref 0.00–0.07)
BASOS ABS: 0 10*3/uL (ref 0.0–0.1)
Basophils Relative: 0 %
Eosinophils Absolute: 0.2 10*3/uL (ref 0.0–0.5)
Eosinophils Relative: 4 %
HCT: 39.3 % (ref 36.0–46.0)
HEMOGLOBIN: 12.4 g/dL (ref 12.0–15.0)
Immature Granulocytes: 1 %
LYMPHS ABS: 1 10*3/uL (ref 0.7–4.0)
LYMPHS PCT: 24 %
MCH: 32.3 pg (ref 26.0–34.0)
MCHC: 31.6 g/dL (ref 30.0–36.0)
MCV: 102.3 fL — ABNORMAL HIGH (ref 80.0–100.0)
MONO ABS: 0.2 10*3/uL (ref 0.1–1.0)
Monocytes Relative: 4 %
NEUTROS ABS: 2.8 10*3/uL (ref 1.7–7.7)
Neutrophils Relative %: 67 %
Platelets: 177 10*3/uL (ref 150–400)
RBC: 3.84 MIL/uL — AB (ref 3.87–5.11)
RDW: 14.7 % (ref 11.5–15.5)
WBC: 4.2 10*3/uL (ref 4.0–10.5)
nRBC: 0 % (ref 0.0–0.2)

## 2017-11-23 MED ORDER — OXYMETAZOLINE HCL 0.05 % NA SOLN
1.0000 | Freq: Once | NASAL | Status: AC
  Administered 2017-11-23: 1 via NASAL
  Filled 2017-11-23: qty 15

## 2017-11-23 MED ORDER — TRANEXAMIC ACID 1000 MG/10ML IV SOLN
500.0000 mg | Freq: Once | INTRAVENOUS | Status: AC
  Administered 2017-11-23: 500 mg via TOPICAL
  Filled 2017-11-23: qty 10

## 2017-11-23 NOTE — Discharge Instructions (Signed)
Use the afrin 2 times a day for the next 2 days.  If bleeding starts hold pressure.

## 2017-11-23 NOTE — ED Triage Notes (Addendum)
Pt to ED for evaluation of nosebleed from L nare that started four hours ago and has not stopped bleeding, is on Coumadin. Endorses spitting up clots and feeling "woozy" and nauseous. Has had nosebleeds like this in the past, most recently on Sunday. Bleeding controlled at this time. Sts nose has been cauterized twice in the past.

## 2017-11-23 NOTE — ED Provider Notes (Signed)
[facility] EMERGENCY DEPARTMENT Provider Note   CSN: [id number] Arrival date & time: 11/23/17  1001     History   Chief Complaint Chief Complaint  Patient presents with  . Epistaxis    HPI [name] is a 77 y.o. female.  Patient is a 77 year old female with a significant past medical history for hypertension, chronic atrial fibrillation on Coumadin, recurrent pneumonias who presents today with persistent nosebleed.  Patient states that in the last week she has had a nosebleed for times.  However today she could not get the bleeding stopped.  In the past she has had to have the left side cauterized 3 separate times.  Last INR was checked on 21 October.  She states it has been too high and they have recently been changing her dose.  Over the last few days she has felt more tired but denies any shortness of breath or chest pain.  She currently is also complaining of some mild nausea from the bleeding.  The history is provided by the patient.  Epistaxis   This is a recurrent problem. Episode onset: 4 hours ago. The problem occurs every several days. The problem has been resolved. The problem is associated with anticoagulants. The bleeding has been from the left nare. She has tried applying pressure for the symptoms. The treatment provided moderate relief. Her past medical history is significant for frequent nosebleeds.    Past Medical History:  Diagnosis Date  . Acne rosacea   . Adhesive capsulitis of left shoulder 1980  . Allergic rhinitis   . Chronic atrial fibrillation ([place])    a. Coumadin anticoagulation - [facility] ([place])  . Chronic bronchitis ([place])   . Esophageal reflux   . Frequent epistaxis    "cause of my coumadin" (12/16/2015)  . Heart murmur   . History of echocardiogram    a. Echo 4/17:  EF 55-60%, no RWMA, mild MR, mild LAE, PASP 48 mmHg, trivial effusion post to heart  . Hypercholesterolemia   . Hyperlipidemia   . Hypertension     . Osteopenia   . Pleural effusion associated with pulmonary infection    Admx 5/17 >> required L thoracentesis (cytology neg for malignancy)   . Pneumonia    "I've had it 4 times this year" (12/16/2015)  . Polymorphic light eruption 2004  . Prolonged QT interval   . Pulmonary HTN ([place])   . Respiratory failure ([place]) 04/2015   "spent 3 days; went home; then another 13 days in hospital" (12/16/2015  . Rheumatoid arthritis ([place]) 1975  . Right middle ear infection 1986  . Skin cancer    "burndt it off at the right side of my nose/face" (12/16/2015)    Patient Active Problem List   Diagnosis Date Noted  . Afib ([place]) 11/05/2017  . Anticoagulated 05/24/2016  . Allergic rhinitis 03/23/2016  . Cough 03/23/2016  . Asthmatic bronchitis with acute exacerbation 03/10/2016  . Anemia 02/08/2016  . 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 Malnutrition of moderate degree 12/30/2015  . Immunosuppressed status ([place])   . Diarrhea 12/28/2015  . Fall 12/28/2015  . Acute on chronic diastolic CHF (congestive heart failure) ([place]) 12/28/2015  . Sepsis ([place]) 12/27/2015  . Pericardial effusion 12/15/2015  . GERD (gastroesophageal reflux disease) 12/15/2015  . Pulmonary hypertension ([place]) 06/08/2015  . Chronic atrial fibrillation   . Long term (current) use of anticoagulants   . Chest tube in place   . Exudative  pleural effusion   . HCAP (healthcare-associated pneumonia) 05/23/2015  . Acute respiratory failure with hypoxia ([place]) 05/23/2015  . Pleural effusion, right 05/23/2015  . Pleuritic chest pain 05/23/2015  . Acute respiratory failure ([place]) 05/23/2015  . Hypokalemia 04/24/2015  . Hypomagnesemia 04/24/2015  . QT prolongation 04/24/2015  . Rheumatoid arthritis ([place]) 04/24/2015  . CAP (community acquired pneumonia) 08/03/2014  . Encounter for therapeutic drug monitoring 07/14/2013  . Hypertension 05/15/2013  . Hyperlipidemia 05/15/2013  . Ankle edema 05/15/2013  .  Long term current use of anticoagulant therapy 11/07/2012    Past Surgical History:  Procedure Laterality Date  . BREAST BIOPSY Left 2000s X 2  . CARDIAC CATHETERIZATION  03/2010   a. [place] 2/09: no ischemia; low risk  //  b. [place] 3/12: normal coronary arteries  . CARDIAC CATHETERIZATION N/A 12/16/2015   Procedure: Pericardiocentesis;  Surgeon: Nelva Bush, MD;  Location: [facility];  Service: Cardiovascular;  Laterality: N/A;  . CARDIOVERSION  04/2010  . TEMPOROMANDIBULAR JOINT SURGERY Bilateral   . TOTAL ABDOMINAL HYSTERECTOMY       OB History   None      Home Medications    Prior to Admission medications   Medication Sig Start Date End Date Taking? Authorizing Provider  acidophilus (RISAQUAD) CAPS capsule Take 1 capsule by mouth daily.    [provider]  albuterol (PROVENTIL) (2.5 MG/3ML) 0.083% nebulizer solution Take 3 mLs (2.5 mg total) by nebulization every 6 (six) hours as needed for wheezing or shortness of breath. 03/10/16   Parrett, Fonnie Mu, NP  cholecalciferol (VITAMIN D) 1000 UNITS tablet Take 2,000 Units by mouth daily.    [provider]  etanercept (ENBREL) 50 MG/ML injection Inject 50 mg as directed once a week.    [provider]  fluticasone (FLONASE) 50 MCG/ACT nasal spray Place 2 sprays into both nostrils daily as needed for rhinitis.     [provider]  folic acid (FOLVITE) 1 MG tablet Take 2 mg by mouth daily.     [provider]  furosemide (LASIX) 40 MG tablet TAKE 1 TABLET BY MOUTH EVERY DAY OR AS DIRECTED 04/27/17   Jettie Booze, MD  guaiFENesin (MUCINEX) 600 MG 12 hr tablet Take 600 mg by mouth 2 (two) times daily as needed for cough or to loosen phlegm.     [provider]  methotrexate (RHEUMATREX) 2.5 MG tablet Take 5-7.5 mg by mouth 2 (two) times a week. Pt takes 2 tablets every Tuesday and Wednesday    [provider]  metoprolol succinate (TOPROL-XL) 100 MG 24 hr  tablet TAKE 1 TABLET BY MOUTH TWICE DAILY, TAKE WITH OR IMMEDIATELY FOLLOWING A MEAL 04/27/17   Jettie Booze, MD  metoprolol succinate (TOPROL-XL) 25 MG 24 hr tablet TAKE 1 TABLET BY MOUTH TWICE DAILY(TO TAKE ALONG WITH METOPROLOL XL 100MG  FOR TOTAL OF 125MG  TWICE DAILY) 04/27/17   Jettie Booze, MD  Multiple Vitamins-Minerals (MULTIVITAMIN WITH MINERALS) tablet Take 1 tablet by mouth daily.    [provider]  potassium chloride (K-DUR,KLOR-CON) 10 MEQ tablet Take 2 tablets (20 mEq total) by mouth daily. 01/16/16   Barton Dubois, MD  pravastatin (PRAVACHOL) 20 MG tablet Take 20 mg by mouth at bedtime.    [provider]  predniSONE (DELTASONE) 2.5 MG tablet Take 2.5 mg by mouth daily with breakfast.     [provider]  ranitidine (ZANTAC) 150 MG tablet Take 1 tablet (150 mg total) by mouth at  bedtime. 12/31/15   Florencia Reasons, MD  traMADol (ULTRAM) 50 MG tablet Take 50 mg by mouth as needed for pain. 01/29/17   [provider]  verapamil (CALAN) 120 MG tablet Take 1 tablet (120 mg total) by mouth every 12 (twelve) hours. 01/02/16   Florencia Reasons, MD  warfarin (COUMADIN) 5 MG tablet Take as directed by coumadin clinic 08/02/17   Jettie Booze, MD    Family History Family History  Problem Relation Age of Onset  . Heart disease Father   . Heart disease Mother   . Other Mother        prolonged qtc  . Heart disease Sister   . Heart disease Sister   . Healthy Sister   . Fainting Neg Hx     Social History Social History   Tobacco Use  . Smoking status: Never Smoker  . Smokeless tobacco: Never Used  Substance Use Topics  . Alcohol use: No  . Drug use: No     Allergies   Arthrotec [diclofenac-misoprostol]; Erythromycin; Morphine sulfate; Ciprofloxacin; Morphine; Amoxicillin; Diltiazem hcl; Gold-containing drug products; Macrodantin [nitrofurantoin macrocrystal]; Naproxen; and Penicillins   Review of Systems Review of Systems  HENT:  Positive for nosebleeds.   All other systems reviewed and are negative.    Physical Exam Updated Vital Signs BP 139/68 (BP Location: Left Arm)   Pulse 87   Temp 98.3 F (36.8 C) (Oral)   Resp 16   SpO2 100%   Physical Exam  Constitutional: She is oriented to person, place, and time. She appears well-developed and well-nourished. No distress.  HENT:  Head: Normocephalic and atraumatic.  Nose:    No bleeding in the posterior pharynx  Eyes: Pupils are equal, round, and reactive to light. EOM are normal.  Neck: Normal range of motion. Neck supple.  Cardiovascular: Normal rate.  Pulmonary/Chest: Effort normal.  Neurological: She is alert and oriented to person, place, and time.  Skin: Skin is warm and dry. Capillary refill takes less than 2 seconds. There is pallor.  Psychiatric: She has a normal mood and affect. Her behavior is normal.  Nursing note and vitals reviewed.    ED Treatments / Results  Labs (all labs ordered are listed, but only abnormal results are displayed) Labs Reviewed  PROTIME-INR - Abnormal; Notable for the following components:      Result Value   Prothrombin Time 25.1 (*)    All other components within normal limits  CBC WITH DIFFERENTIAL/PLATELET - Abnormal; Notable for the following components:   RBC 3.84 (*)    MCV 102.3 (*)    All other components within normal limits    EKG None  Radiology No results found.  Procedures Procedures (including critical care time)  Medications Ordered in ED Medications  tranexamic acid (CYKLOKAPRON) injection 500 mg (has no administration in time range)     Initial Impression / Assessment and Plan / ED Course  I have reviewed the triage vital signs and the nursing notes.  Pertinent labs & imaging results that were available during my care of the patient were reviewed by me and considered in my medical decision making (see chart for details).     Patient presenting today with anterior left  epistaxis has been intermittent for the last week.  Bleed had resolved upon arrival but vessel was identified in the anterior nare.  Attempts to cauterize caused rebleeding.  TXA and pressure was applied.  Also patient is on Coumadin and INR and CBC pending.  Patient is hemodynamically stable with reassuring vital signs.  11:48 AM After TXA bleeding has stopped.  INR is 2.3 and hemoglobin is stable at 12.  Patient is feeling much better.  Will discharge home to follow-up with Dr. Lucia Gaskins for any issues.  Patient will use Afrin twice a day for the next 3 days.  Final Clinical Impressions(s) / ED Diagnoses   Final diagnoses:  Left-sided epistaxis    ED Discharge Orders    None       Blanchie Dessert, MD 11/23/17 1150

## 2017-12-03 ENCOUNTER — Ambulatory Visit (INDEPENDENT_AMBULATORY_CARE_PROVIDER_SITE_OTHER): Payer: Medicare Other | Admitting: Pharmacist

## 2017-12-03 DIAGNOSIS — Z5181 Encounter for therapeutic drug level monitoring: Secondary | ICD-10-CM | POA: Diagnosis not present

## 2017-12-03 DIAGNOSIS — Z7901 Long term (current) use of anticoagulants: Secondary | ICD-10-CM

## 2017-12-03 DIAGNOSIS — I4891 Unspecified atrial fibrillation: Secondary | ICD-10-CM | POA: Diagnosis not present

## 2017-12-03 LAB — POCT INR: INR: 2.9 (ref 2.0–3.0)

## 2017-12-03 NOTE — Patient Instructions (Signed)
Description   Continue on same dosage 1 tablet daily except 1/2 tablet on Mondays and Fridays.   Recheck in 4 weeks. Call us with any medication changes or bleeding concerns, [phone], Main [phone].

## 2017-12-25 ENCOUNTER — Encounter (HOSPITAL_COMMUNITY): Payer: Self-pay | Admitting: *Deleted

## 2017-12-25 ENCOUNTER — Telehealth: Payer: Self-pay | Admitting: Interventional Cardiology

## 2017-12-25 ENCOUNTER — Inpatient Hospital Stay (HOSPITAL_COMMUNITY)
Admission: EM | Admit: 2017-12-25 | Discharge: 2017-12-30 | DRG: 918 | Disposition: A | Discharge status: Home / Self Care | Payer: Medicare Other | Attending: Internal Medicine | Admitting: Internal Medicine

## 2017-12-25 DIAGNOSIS — T461X1A Poisoning by calcium-channel blockers, accidental (unintentional), initial encounter: Principal | ICD-10-CM | POA: Diagnosis present

## 2017-12-25 DIAGNOSIS — R001 Bradycardia, unspecified: Secondary | ICD-10-CM | POA: Diagnosis not present

## 2017-12-25 DIAGNOSIS — Z7951 Long term (current) use of inhaled steroids: Secondary | ICD-10-CM

## 2017-12-25 DIAGNOSIS — Z7901 Long term (current) use of anticoagulants: Secondary | ICD-10-CM

## 2017-12-25 DIAGNOSIS — E7800 Pure hypercholesterolemia, unspecified: Secondary | ICD-10-CM | POA: Diagnosis present

## 2017-12-25 DIAGNOSIS — I272 Pulmonary hypertension, unspecified: Secondary | ICD-10-CM | POA: Diagnosis present

## 2017-12-25 DIAGNOSIS — I5032 Chronic diastolic (congestive) heart failure: Secondary | ICD-10-CM | POA: Diagnosis present

## 2017-12-25 DIAGNOSIS — I482 Chronic atrial fibrillation, unspecified: Secondary | ICD-10-CM | POA: Diagnosis present

## 2017-12-25 DIAGNOSIS — Z85828 Personal history of other malignant neoplasm of skin: Secondary | ICD-10-CM

## 2017-12-25 DIAGNOSIS — M069 Rheumatoid arthritis, unspecified: Secondary | ICD-10-CM | POA: Diagnosis present

## 2017-12-25 DIAGNOSIS — Z7952 Long term (current) use of systemic steroids: Secondary | ICD-10-CM

## 2017-12-25 DIAGNOSIS — E785 Hyperlipidemia, unspecified: Secondary | ICD-10-CM | POA: Diagnosis present

## 2017-12-25 DIAGNOSIS — N179 Acute kidney failure, unspecified: Secondary | ICD-10-CM | POA: Diagnosis not present

## 2017-12-25 DIAGNOSIS — E860 Dehydration: Secondary | ICD-10-CM | POA: Diagnosis present

## 2017-12-25 DIAGNOSIS — I10 Essential (primary) hypertension: Secondary | ICD-10-CM

## 2017-12-25 DIAGNOSIS — T50901A Poisoning by unspecified drugs, medicaments and biological substances, accidental (unintentional), initial encounter: Secondary | ICD-10-CM

## 2017-12-25 DIAGNOSIS — Z79899 Other long term (current) drug therapy: Secondary | ICD-10-CM

## 2017-12-25 DIAGNOSIS — I110 Hypertensive heart disease with heart failure: Secondary | ICD-10-CM | POA: Diagnosis present

## 2017-12-25 DIAGNOSIS — K219 Gastro-esophageal reflux disease without esophagitis: Secondary | ICD-10-CM | POA: Diagnosis present

## 2017-12-25 DIAGNOSIS — E876 Hypokalemia: Secondary | ICD-10-CM | POA: Diagnosis present

## 2017-12-25 LAB — CBC WITH DIFFERENTIAL/PLATELET
Abs Immature Granulocytes: 0.06 10*3/uL (ref 0.00–0.07)
BASOS PCT: 0 %
Basophils Absolute: 0 10*3/uL (ref 0.0–0.1)
EOS PCT: 1 %
Eosinophils Absolute: 0.1 10*3/uL (ref 0.0–0.5)
HCT: 41.9 % (ref 36.0–46.0)
Hemoglobin: 13.2 g/dL (ref 12.0–15.0)
IMMATURE GRANULOCYTES: 1 %
LYMPHS ABS: 1.4 10*3/uL (ref 0.7–4.0)
Lymphocytes Relative: 16 %
MCH: 33.2 pg (ref 26.0–34.0)
MCHC: 31.5 g/dL (ref 30.0–36.0)
MCV: 105.3 fL — AB (ref 80.0–100.0)
MONOS PCT: 10 %
Monocytes Absolute: 0.9 10*3/uL (ref 0.1–1.0)
Neutro Abs: 6.6 10*3/uL (ref 1.7–7.7)
Neutrophils Relative %: 72 %
Platelets: 233 10*3/uL (ref 150–400)
RBC: 3.98 MIL/uL (ref 3.87–5.11)
RDW: 14.5 % (ref 11.5–15.5)
WBC: 9.1 10*3/uL (ref 4.0–10.5)
nRBC: 0.2 % (ref 0.0–0.2)

## 2017-12-25 LAB — COMPREHENSIVE METABOLIC PANEL
ALK PHOS: 60 U/L (ref 38–126)
ALT: 34 U/L (ref 0–44)
ANION GAP: 11 (ref 5–15)
AST: 38 U/L (ref 15–41)
Albumin: 4.2 g/dL (ref 3.5–5.0)
BUN: 14 mg/dL (ref 8–23)
CALCIUM: 10.1 mg/dL (ref 8.9–10.3)
CO2: 28 mmol/L (ref 22–32)
Chloride: 104 mmol/L (ref 98–111)
Creatinine, Ser: 1.37 mg/dL — ABNORMAL HIGH (ref 0.44–1.00)
GFR calc Af Amer: 43 mL/min — ABNORMAL LOW (ref >60)
GFR calc non Af Amer: 37 mL/min — ABNORMAL LOW (ref >60)
Glucose, Bld: 168 mg/dL — ABNORMAL HIGH (ref 70–99)
Potassium: 3.4 mmol/L — ABNORMAL LOW (ref 3.5–5.1)
Sodium: 143 mmol/L (ref 135–145)
Total Bilirubin: 0.7 mg/dL (ref 0.3–1.2)
Total Protein: 6.8 g/dL (ref 6.5–8.1)

## 2017-12-25 LAB — PROTIME-INR
INR: 2.63
Prothrombin Time: 27.7 seconds — ABNORMAL HIGH (ref 11.4–15.2)

## 2017-12-25 LAB — MAGNESIUM: Magnesium: 2.4 mg/dL (ref 1.7–2.4)

## 2017-12-25 MED ORDER — RISAQUAD PO CAPS
1.0000 | ORAL_CAPSULE | Freq: Every day | ORAL | Status: DC
  Administered 2017-12-26 – 2017-12-30 (×5): 1 via ORAL
  Filled 2017-12-25 (×5): qty 1

## 2017-12-25 MED ORDER — ALBUTEROL SULFATE (2.5 MG/3ML) 0.083% IN NEBU: 2.5000 mg | INHALATION_SOLUTION | RESPIRATORY_TRACT | Status: DC | PRN

## 2017-12-25 MED ORDER — METHOTREXATE 2.5 MG PO TABS
2.5000 mg | ORAL_TABLET | ORAL | Status: DC
  Administered 2017-12-26: 2.5 mg via ORAL
  Filled 2017-12-25: qty 1

## 2017-12-25 MED ORDER — VITAMIN D 25 MCG (1000 UNIT) PO TABS
2000.0000 [IU] | ORAL_TABLET | Freq: Every day | ORAL | Status: DC
  Administered 2017-12-26 – 2017-12-30 (×5): 2000 [IU] via ORAL
  Filled 2017-12-25 (×5): qty 2

## 2017-12-25 MED ORDER — SODIUM CHLORIDE 0.9 % IV SOLN
1.0000 mg/kg/h | INTRAVENOUS | Status: DC
  Administered 2017-12-25: 1.5 mg/kg/h via INTRAVENOUS
  Administered 2017-12-26: 1 mg/kg/h via INTRAVENOUS
  Filled 2017-12-25 (×5): qty 100

## 2017-12-25 MED ORDER — SENNOSIDES-DOCUSATE SODIUM 8.6-50 MG PO TABS: 1.0000 | ORAL_TABLET | Freq: Every evening | ORAL | Status: DC | PRN

## 2017-12-25 MED ORDER — WARFARIN SODIUM 5 MG PO TABS
5.0000 mg | ORAL_TABLET | ORAL | Status: DC
  Administered 2017-12-25 – 2017-12-26 (×2): 5 mg via ORAL
  Filled 2017-12-25 (×2): qty 1

## 2017-12-25 MED ORDER — VITAMIN C 500 MG PO TABS
1000.0000 mg | ORAL_TABLET | Freq: Every day | ORAL | Status: DC
  Administered 2017-12-26 – 2017-12-30 (×5): 1000 mg via ORAL
  Filled 2017-12-25 (×5): qty 2

## 2017-12-25 MED ORDER — FOLIC ACID 1 MG PO TABS
2.0000 mg | ORAL_TABLET | Freq: Every day | ORAL | Status: DC
  Administered 2017-12-26 – 2017-12-30 (×5): 2 mg via ORAL
  Filled 2017-12-25 (×5): qty 2

## 2017-12-25 MED ORDER — ACETAMINOPHEN 325 MG PO TABS: 650.0000 mg | ORAL_TABLET | Freq: Four times a day (QID) | ORAL | Status: DC | PRN

## 2017-12-25 MED ORDER — ATROPINE SULFATE 1 MG/10ML IJ SOSY
0.5000 mg | PREFILLED_SYRINGE | Freq: Once | INTRAMUSCULAR | Status: AC
  Administered 2017-12-25: 0.5 mg via INTRAVENOUS
  Filled 2017-12-25: qty 10

## 2017-12-25 MED ORDER — ACETAMINOPHEN 650 MG RE SUPP: 650.0000 mg | Freq: Four times a day (QID) | RECTAL | Status: DC | PRN

## 2017-12-25 MED ORDER — SODIUM CHLORIDE 0.9 % IV BOLUS
1000.0000 mL | Freq: Once | INTRAVENOUS | Status: AC
  Administered 2017-12-25: 1000 mL via INTRAVENOUS

## 2017-12-25 MED ORDER — SODIUM CHLORIDE 0.9 % IV SOLN
1.0000 g | Freq: Once | INTRAVENOUS | Status: DC
  Filled 2017-12-25: qty 10

## 2017-12-25 MED ORDER — ATROPINE SULFATE 1 MG/10ML IJ SOSY
PREFILLED_SYRINGE | INTRAMUSCULAR | Status: AC
  Administered 2017-12-26: 0.5 mg via INTRAVENOUS
  Filled 2017-12-25: qty 10

## 2017-12-25 MED ORDER — POTASSIUM CHLORIDE CRYS ER 20 MEQ PO TBCR
40.0000 meq | EXTENDED_RELEASE_TABLET | Freq: Once | ORAL | Status: AC
  Administered 2017-12-25: 40 meq via ORAL
  Filled 2017-12-25: qty 2

## 2017-12-25 MED ORDER — CALCIUM GLUCONATE-NACL 1-0.675 GM/50ML-% IV SOLN
1.0000 g | Freq: Once | INTRAVENOUS | Status: AC
  Administered 2017-12-25: 1000 mg via INTRAVENOUS
  Filled 2017-12-25: qty 50

## 2017-12-25 MED ORDER — GLUCAGON HCL RDNA (DIAGNOSTIC) 1 MG IJ SOLR
1.0000 mg | Freq: Once | INTRAMUSCULAR | Status: AC
  Administered 2017-12-25: 1 mg via INTRAVENOUS
  Filled 2017-12-25: qty 1

## 2017-12-25 MED ORDER — ZOLPIDEM TARTRATE 5 MG PO TABS: 5.0000 mg | ORAL_TABLET | Freq: Every evening | ORAL | Status: DC | PRN

## 2017-12-25 MED ORDER — SODIUM CHLORIDE 0.9 % IV SOLN: INTRAVENOUS | Status: DC | PRN

## 2017-12-25 MED ORDER — POTASSIUM CHLORIDE 10 MEQ/100ML IV SOLN
10.0000 meq | INTRAVENOUS | Status: AC
  Administered 2017-12-25 (×4): 10 meq via INTRAVENOUS
  Filled 2017-12-25 (×4): qty 100

## 2017-12-25 MED ORDER — ADULT MULTIVITAMIN W/MINERALS CH
1.0000 | ORAL_TABLET | Freq: Every day | ORAL | Status: DC
  Administered 2017-12-26 – 2017-12-30 (×5): 1 via ORAL
  Filled 2017-12-25 (×5): qty 1

## 2017-12-25 MED ORDER — FLUTICASONE PROPIONATE 50 MCG/ACT NA SUSP: 2.0000 | Freq: Every day | NASAL | Status: DC | PRN

## 2017-12-25 MED ORDER — WARFARIN - PHARMACIST DOSING INPATIENT
Freq: Every day | Status: DC
  Administered 2017-12-27: 18:00:00

## 2017-12-25 MED ORDER — WARFARIN SODIUM 2.5 MG PO TABS: 2.5000 mg | ORAL_TABLET | ORAL | Status: DC

## 2017-12-25 MED ORDER — MAGNESIUM OXIDE 400 (241.3 MG) MG PO TABS
400.0000 mg | ORAL_TABLET | Freq: Every day | ORAL | Status: DC
  Administered 2017-12-26 – 2017-12-30 (×5): 400 mg via ORAL
  Filled 2017-12-25 (×5): qty 1

## 2017-12-25 MED ORDER — METHYLPREDNISOLONE SODIUM SUCC 40 MG IJ SOLR
40.0000 mg | Freq: Once | INTRAMUSCULAR | Status: AC
  Administered 2017-12-25: 40 mg via INTRAVENOUS
  Filled 2017-12-25: qty 1

## 2017-12-25 MED ORDER — TRAMADOL HCL 50 MG PO TABS: 50.0000 mg | ORAL_TABLET | Freq: Four times a day (QID) | ORAL | Status: DC | PRN

## 2017-12-25 MED ORDER — PREDNISONE 5 MG PO TABS
5.0000 mg | ORAL_TABLET | Freq: Every day | ORAL | Status: DC
  Administered 2017-12-26 – 2017-12-30 (×5): 5 mg via ORAL
  Filled 2017-12-25 (×6): qty 1

## 2017-12-25 MED ORDER — MAGNESIUM SULFATE 2 GM/50ML IV SOLN
2.0000 g | Freq: Once | INTRAVENOUS | Status: AC
  Administered 2017-12-25: 2 g via INTRAVENOUS
  Filled 2017-12-25: qty 50

## 2017-12-25 MED ORDER — PRAVASTATIN SODIUM 20 MG PO TABS
20.0000 mg | ORAL_TABLET | Freq: Every day | ORAL | Status: DC
  Administered 2017-12-26 – 2017-12-29 (×5): 20 mg via ORAL
  Filled 2017-12-25 (×5): qty 1

## 2017-12-25 NOTE — Progress Notes (Signed)
ANTICOAGULATION CONSULT NOTE - Initial Consult  Pharmacy Consult for coumadin Indication: atrial fibrillation  Allergies  Allergen Reactions  . Arthrotec [Diclofenac-Misoprostol] Diarrhea  . Erythromycin Anaphylaxis       . Morphine Sulfate Other (See Comments)    Reaction:  Hallucinations   . Amoxicillin Rash and Other (See Comments)    Has patient had a PCN reaction causing immediate rash, facial/tongue/throat swelling, SOB or lightheadedness with hypotension: Yes Has patient had a PCN reaction causing severe rash involving mucus membranes or skin necrosis: Yes Has patient had a PCN reaction that required hospitalization No Has patient had a PCN reaction occurring within the last 10 years: No If all of the above answers are "NO", then may proceed with Cephalosporin use.  . Ciprofloxacin Rash  . Diltiazem Hcl Swelling, Rash and Other (See Comments)    Pt is able to tolerate Verapamil.    . Gold-Containing Drug Products Rash  . Macrodantin [Nitrofurantoin Macrocrystal] Rash  . Morphine Rash  . Naproxen Rash  . Penicillins Rash and Other (See Comments)    ++ tolerates cefepime and ceftriaxone ++ Has patient had a PCN reaction causing immediate rash, facial/tongue/throat swelling, SOB or lightheadedness with hypotension: Yes Has patient had a PCN reaction causing severe rash involving mucus membranes or skin necrosis: Yes Has patient had a PCN reaction that required hospitalization No Has patient had a PCN reaction occurring within the last 10 years: No If all of the above answers are "NO", then may proceed with Cephalosporin use.     Patient Measurements: Height: 5\' 3"  (160 cm) Weight: 150 lb (68 kg) IBW/kg (Calculated) : 52.4  Vital Signs: Temp: 97.9 F (36.6 C) (12/10 1519) Temp Source: Oral (12/10 1519) BP: 111/67 (12/10 1830) Pulse Rate: 73 (12/10 1830)  Labs: Recent Labs    12/25/17 1538 12/25/17 1841  HGB 13.2  --   HCT 41.9  --   PLT 233  --   LABPROT  --   27.7*  INR  --  2.63  CREATININE 1.37*  --     Estimated Creatinine Clearance: 32.3 mL/min (A) (by C-G formula based on SCr of 1.37 mg/dL (H)).   Medical History: Past Medical History:  Diagnosis Date  . Acne rosacea   . Adhesive capsulitis of left shoulder 1980  . Allergic rhinitis   . Chronic atrial fibrillation    a. Coumadin anticoagulation - [facility] ([place])  . Chronic bronchitis ([place])   . Esophageal reflux   . Frequent epistaxis    "cause of my coumadin" (12/16/2015)  . Heart murmur   . History of echocardiogram    a. Echo 4/17:  EF 55-60%, no RWMA, mild MR, mild LAE, PASP 48 mmHg, trivial effusion post to heart  . Hypercholesterolemia   . Hyperlipidemia   . Hypertension   . Osteopenia   . Pleural effusion associated with pulmonary infection    Admx 5/17 >> required L thoracentesis (cytology neg for malignancy)   . Pneumonia    "I've had it 4 times this year" (12/16/2015)  . Polymorphic light eruption 2004  . Prolonged QT interval   . Pulmonary HTN ([place])   . Respiratory failure ([place]) 04/2015   "spent 3 days; went home; then another 13 days in hospital" (12/16/2015  . Rheumatoid arthritis ([place]) 1975  . Right middle ear infection 1986  . Skin cancer    "burndt it off at the right side of my nose/face" (12/16/2015)   Assessment: 77 yo F with accidental  verapamil OD.  Pharmacy consulted to dose coumadin.  Pt on coumadin PTA for afib.  Last coumadin clinic visit 12/03/2017: INR 2.9-  Take 5 mg daily X 2.5 mg on Mondays and Fridays. Return visit 12/31/2017 Admission INR is therapeutic at 2.63.  CBC WNL.  No bleeding reported.  Pt took last dose Monday 12/9 coumadin 2.5 mg between 6 and 7 pm per pt interview.  Goal of Therapy:  INR 2-3 Monitor platelets by anticoagulation protocol: Yes   Plan:  Continue home dose - 5 mg daily X 2.5 mg Mon/Friday Daily INR  Eudelia Bunch, Pharm.D [phone] 12/25/2017 8:17 PM

## 2017-12-25 NOTE — ED Provider Notes (Signed)
Emergency Department Provider Note   I have reviewed the triage vital signs and the nursing notes.   HISTORY  Chief Complaint Drug Overdose   HPI [name] is a 77 y.o. female with medical problems documented below the presents the emergency department today secondary to verapamil overdose. Took 5-6 120 mg verapamil around 1100. A few hours later she started to feel weak. No syncope. No chest pain. No nausea or vomiting.  No other associated or modifying symptoms.    Past Medical History:  Diagnosis Date  . Acne rosacea   . Adhesive capsulitis of left shoulder 1980  . Allergic rhinitis   . Chronic atrial fibrillation    a. Coumadin anticoagulation - [facility] ([place])  . Chronic bronchitis ([place])   . Esophageal reflux   . Frequent epistaxis    "cause of my coumadin" (12/16/2015)  . Heart murmur   . History of echocardiogram    a. Echo 4/17:  EF 55-60%, no RWMA, mild MR, mild LAE, PASP 48 mmHg, trivial effusion post to heart  . Hypercholesterolemia   . Hyperlipidemia   . Hypertension   . Osteopenia   . Pleural effusion associated with pulmonary infection    Admx 5/17 >> required L thoracentesis (cytology neg for malignancy)   . Pneumonia    "I've had it 4 times this year" (12/16/2015)  . Polymorphic light eruption 2004  . Prolonged QT interval   . Pulmonary HTN ([place])   . Respiratory failure ([place]) 04/2015   "spent 3 days; went home; then another 13 days in hospital" (12/16/2015  . Rheumatoid arthritis ([place]) 1975  . Right middle ear infection 1986  . Skin cancer    "burndt it off at the right side of my nose/face" (12/16/2015)    Patient Active Problem List   Diagnosis Date Noted  . Overdose, accidental or unintentional, initial encounter 12/25/2017  . Chronic diastolic CHF (congestive heart failure) ([place]) 12/25/2017  . AKI (acute kidney injury) ([place]) 12/25/2017  . Bradycardia 12/25/2017  . Afib ([place]) 11/05/2017  . Anticoagulated 05/24/2016  .  Allergic rhinitis 03/23/2016  . Cough 03/23/2016  . Asthmatic bronchitis with acute exacerbation 03/10/2016  . Anemia 02/08/2016  . 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 Malnutrition of moderate degree 12/30/2015  . Immunosuppressed status ([place])   . Diarrhea 12/28/2015  . Fall 12/28/2015  . Acute on chronic diastolic CHF (congestive heart failure) ([place]) 12/28/2015  . Sepsis ([place]) 12/27/2015  . Pericardial effusion 12/15/2015  . GERD (gastroesophageal reflux disease) 12/15/2015  . Pulmonary hypertension ([place]) 06/08/2015  . Chronic atrial fibrillation   . Long term (current) use of anticoagulants   . Chest tube in place   . Exudative pleural effusion   . HCAP (healthcare-associated pneumonia) 05/23/2015  . Acute respiratory failure with hypoxia ([place]) 05/23/2015  . Pleural effusion, right 05/23/2015  . Pleuritic chest pain 05/23/2015  . Acute respiratory failure ([place]) 05/23/2015  . Hypokalemia 04/24/2015  . Hypomagnesemia 04/24/2015  . QT prolongation 04/24/2015  . Rheumatoid arthritis ([place]) 04/24/2015  . CAP (community acquired pneumonia) 08/03/2014  . Encounter for therapeutic drug monitoring 07/14/2013  . Hypertension 05/15/2013  . Hyperlipidemia 05/15/2013  . Ankle edema 05/15/2013  . Long term current use of anticoagulant therapy 11/07/2012    Past Surgical History:  Procedure Laterality Date  . BREAST BIOPSY Left 2000s X 2  . CARDIAC CATHETERIZATION  03/2010   a. Myoview 2/09: no ischemia; low risk  //  b. LHC 3/12: normal coronary arteries  . CARDIAC CATHETERIZATION N/A 12/16/2015   Procedure: Pericardiocentesis;  Surgeon: Nelva Bush, MD;  Location: [facility];  Service: Cardiovascular;  Laterality: N/A;  . CARDIOVERSION  04/2010  . TEMPOROMANDIBULAR JOINT SURGERY Bilateral   . TOTAL ABDOMINAL HYSTERECTOMY        Allergies Arthrotec [diclofenac-misoprostol]; Erythromycin; Morphine sulfate; Amoxicillin; Ciprofloxacin;  Diltiazem hcl; Gold-containing drug products; Macrodantin [nitrofurantoin macrocrystal]; Morphine; Naproxen; and Penicillins  Family History  Problem Relation Age of Onset  . Heart disease Father   . Heart disease Mother   . Other Mother        prolonged qtc  . Heart disease Sister   . Heart disease Sister   . Healthy Sister   . Fainting Neg Hx     Social History Social History   Tobacco Use  . Smoking status: Never Smoker  . Smokeless tobacco: Never Used  Substance Use Topics  . Alcohol use: No  . Drug use: No    Review of Systems  All other systems negative except as documented in the HPI. All pertinent positives and negatives as reviewed in the HPI. ____________________________________________   PHYSICAL EXAM:  VITAL SIGNS: ED Triage Vitals  Enc Vitals Group     BP 12/25/17 1519 97/68     Pulse Rate 12/25/17 1519 (!) 42     Resp 12/25/17 1519 15     Temp 12/25/17 1519 97.9 F (36.6 C)     Temp Source 12/25/17 1519 Oral     SpO2 12/25/17 1519 95 %     Weight 12/25/17 1519 150 lb (68 kg)     Height 12/25/17 1519 5\' 3"  (1.6 m)    Constitutional: Alert and oriented. Well appearing and in no acute distress. Eyes: Conjunctivae are normal. PERRL. EOMI. Head: Atraumatic. Nose: No congestion/rhinnorhea. Mouth/Throat: Mucous membranes are moist.  Oropharynx non-erythematous. Neck: No stridor.  No meningeal signs.   Cardiovascular: bradycardic rate, regular rhythm. Good peripheral circulation. Grossly normal heart sounds.   Respiratory: Normal respiratory effort.  No retractions. Lungs CTAB. Gastrointestinal: Soft and nontender. No distention.  Musculoskeletal: No lower extremity tenderness nor edema. No gross deformities of extremities. Neurologic:  Normal speech and language. No gross focal neurologic deficits are appreciated.  Skin:  Skin is warm, dry and intact. No rash noted.  ____________________________________________   LABS (all labs ordered are listed,  but only abnormal results are displayed)  Labs Reviewed  CBC WITH DIFFERENTIAL/PLATELET - Abnormal; Notable for the following components:      Result Value   MCV 105.3 (*)    All other components within normal limits  COMPREHENSIVE METABOLIC PANEL - Abnormal; Notable for the following components:   Potassium 3.4 (*)    Glucose, Bld 168 (*)    Creatinine, Ser 1.37 (*)    GFR calc non Af Amer 37 (*)    GFR calc Af Amer 43 (*)    All other components within normal limits  PROTIME-INR - Abnormal; Notable for the following components:   Prothrombin Time 27.7 (*)    All other components within normal limits  BRAIN NATRIURETIC PEPTIDE - Abnormal; Notable for the following components:   B Natriuretic Peptide 249.0 (*)    All other components within normal limits  MRSA PCR SCREENING  MAGNESIUM  CBC  MAGNESIUM  PROTIME-INR  BASIC METABOLIC PANEL  BASIC METABOLIC PANEL  BASIC METABOLIC PANEL  BASIC METABOLIC PANEL  BASIC METABOLIC PANEL  BASIC METABOLIC PANEL  BASIC METABOLIC  PANEL  CALCIUM, IONIZED  BASIC METABOLIC PANEL  BASIC METABOLIC PANEL  BASIC METABOLIC PANEL  BASIC METABOLIC PANEL  BASIC METABOLIC PANEL  BASIC METABOLIC PANEL  BASIC METABOLIC PANEL   ____________________________________________  EKG   EKG Interpretation  Date/Time:  Tuesday December 25 2017 15:24:59 EST Ventricular Rate:  42 PR Interval:    QRS Duration: 101 QT Interval:  698 QTC Calculation: 584 R Axis:   0 Text Interpretation:  Atrial fibrillation Low voltage, precordial leads RSR' in V1 or V2, right VCD or RVH Nonspecific T abnormalities, diffuse leads Prolonged QT interval Confirmed by Merrily Pew [id number]) on 12/25/2017 5:17:35 PM       ____________________________________________  RADIOLOGY  No results found.  ____________________________________________   PROCEDURES  CRITICAL CARE Performed by: Merrily Pew Total critical care time: 35 minutes Critical care time was  exclusive of separately billable procedures and treating other patients. Critical care was necessary to treat or prevent imminent or life-threatening deterioration. Critical care was time spent personally by me on the following activities: development of treatment plan with patient and/or surrogate as well as nursing, discussions with consultants, evaluation of patient's response to treatment, examination of patient, obtaining history from patient or surrogate, ordering and performing treatments and interventions, ordering and review of laboratory studies, ordering and review of radiographic studies, pulse oximetry and re-evaluation of patient's condition.   Procedure(s) performed:   Procedures  CRITICAL CARE Performed by: Merrily Pew Total critical care time: 35 minutes Critical care time was exclusive of separately billable procedures and treating other patients. Critical care was necessary to treat or prevent imminent or life-threatening deterioration. Critical care was time spent personally by me on the following activities: development of treatment plan with patient and/or surrogate as well as nursing, discussions with consultants, evaluation of patient's response to treatment, examination of patient, obtaining history from patient or surrogate, ordering and performing treatments and interventions, ordering and review of laboratory studies, ordering and review of radiographic studies, pulse oximetry and re-evaluation of patient's condition.     EMERGENCY DEPARTMENT [place] CARDIAC EXAM "Study: Limited Ultrasound of the Heart and Pericardium"  INDICATIONS:CCB overdose, evaluate cardiac activitgy Multiple views of the heart and pericardium were obtained in real-time with a multi-frequency probe.  PERFORMED ZY:SAYTKZ IMAGES ARCHIVED?: Yes LIMITATIONS:  Body habitus VIEWS USED: Subcostal 4 chamber and Apical 4 chamber  INTERPRETATION: Cardiac activity present, Cardiac tamponade absent and  Normal contractility   ____________________________________________   INITIAL IMPRESSION / ASSESSMENT AND PLAN / ED COURSE  Calcium channel blocker overdose with bradycardia but not hypotensive.  Glucagon, calcium and fluids given.  Bedside ultrasound shows that she has good cardiac squeeze just a slow rate.  According to poison control and multiple other sources if she became hypotensive insulin glucose likely would not help she would need vasopressors but if she is not hypotensive to hold off.  She has mild symptoms such as weakness but nothing significant.  Discussed with Belkys Regallado with [facility] who refused to admit patient and requests consultation with critical care and repage to [facility] if they don't want to admit.   Discussed with Rosanne Gutting with [facility] who sates if the patient becomes hypotensive they are more than willing to admit, but at this point doesn't think they need to be the primary team. Recheck still with HR around 40. BP stable. Potassium ordered, calcium infusion ordered, magnesium ordered, another liter bolus.  Will repage [facility].    Pertinent labs & imaging results that were available during my care  of the patient were reviewed by me and considered in my medical decision making (see chart for details).  ____________________________________________  FINAL CLINICAL IMPRESSION(S) / ED DIAGNOSES  Final diagnoses:  Calcium channel blocker overdose, accidental or unintentional, initial encounter  Bradycardia     MEDICATIONS GIVEN DURING THIS VISIT:  Medications  0.9 %  sodium chloride infusion (has no administration in time range)  calcium gluconate 930 mg of elemental calcium in sodium chloride 0.9 % 1,000 mL infusion (0.5 mg elemental calcium/kg/hr  68 kg Intravenous Rate/Dose Change 12/25/17 2052)  traMADol (ULTRAM) tablet 50 mg (has no administration in time range)  methotrexate (RHEUMATREX) tablet 2.5 mg (has no administration in time range)  pravastatin  (PRAVACHOL) tablet 20 mg (20 mg Oral Given 12/26/17 0023)  predniSONE (DELTASONE) tablet 5 mg (has no administration in time range)  acidophilus (RISAQUAD) capsule 1 capsule (has no administration in time range)  magnesium oxide (MAG-OX) tablet 400 mg (has no administration in time range)  folic acid (FOLVITE) tablet 2 mg (has no administration in time range)  vitamin C (ASCORBIC ACID) tablet 1,000 mg (has no administration in time range)  cholecalciferol (VITAMIN D3) tablet 2,000 Units (has no administration in time range)  multivitamin with minerals tablet 1 tablet (has no administration in time range)  fluticasone (FLONASE) 50 MCG/ACT nasal spray 2 spray (has no administration in time range)  acetaminophen (TYLENOL) tablet 650 mg (has no administration in time range)    Or  acetaminophen (TYLENOL) suppository 650 mg (has no administration in time range)  senna-docusate (Senokot-S) tablet 1 tablet (has no administration in time range)  zolpidem (AMBIEN) tablet 5 mg (has no administration in time range)  albuterol (PROVENTIL) (2.5 MG/3ML) 0.083% nebulizer solution 2.5 mg (has no administration in time range)  warfarin (COUMADIN) tablet 5 mg (5 mg Oral Given 12/25/17 2142)    And  warfarin (COUMADIN) tablet 2.5 mg (has no administration in time range)  Warfarin - Pharmacist Dosing Inpatient (has no administration in time range)  atropine 1 MG/10ML injection (has no administration in time range)  sodium chloride 0.9 % bolus 1,000 mL (0 mLs Intravenous Stopped 12/25/17 1803)  glucagon (human recombinant) (GLUCAGEN) injection 1 mg (1 mg Intravenous Given 12/25/17 1549)  calcium gluconate 1 g/ 50 mL sodium chloride IVPB (0 g Intravenous Stopped 12/25/17 1803)  sodium chloride 0.9 % bolus 1,000 mL (0 mLs Intravenous Stopped 12/25/17 2110)  potassium chloride SA (K-DUR,KLOR-CON) CR tablet 40 mEq (40 mEq Oral Given 12/25/17 1902)  potassium chloride 10 mEq in 100 mL IVPB (10 mEq Intravenous New  Bag/Given 12/25/17 2243)  magnesium sulfate IVPB 2 g 50 mL (2 g Intravenous New Bag/Given 12/25/17 2316)  methylPREDNISolone sodium succinate (SOLU-MEDROL) 40 mg/mL injection 40 mg (40 mg Intravenous Given 12/25/17 2142)  atropine 1 MG/10ML injection 0.5 mg (0.5 mg Intravenous Given 12/25/17 2127)     NEW OUTPATIENT MEDICATIONS STARTED DURING THIS VISIT:  Current Discharge Medication List      Note:  This note was prepared with assistance of Dragon voice recognition software. Occasional wrong-word or sound-a-like substitutions may have occurred due to the inherent limitations of voice recognition software.   Benay Pomeroy, Corene Cornea, MD 12/26/17 [phone]

## 2017-12-25 NOTE — ED Notes (Signed)
Attempted to start IV to RPFA without success. IV consult placed at this time

## 2017-12-25 NOTE — Telephone Encounter (Signed)
Call sent straight to Triage. Talked to patient's daughter and told her she needs to call 911 that patient needs to go to the ED. Patient's daughter verbalized understanding.

## 2017-12-25 NOTE — H&P (Addendum)
History and Physical    [name] PZW:[id number] DOB: 12-21-1940 DOA: 12/25/2017  Referring MD/NP/PA:   PCP: Leeroy Cha, MD   Patient coming from:  The patient is coming from home.  At baseline, pt is independent for most of ADL.        Chief Complaint: overdose of verapamil  HPI: [name] is a 77 y.o. female with medical history significant of atrial fibrillation on Coumadin, hypertension, hyperlipidemia, cardiac, pulmonary hypertension, rheumatoid arthritis, dCHF, who presents with overdose of verapamil.  Pt states that she became distracted and took too many of her verapamil around 11. She states that she likely took 6 pills of 120 mg of verapamil. No SI or HI.  A few hours later, she developed it generalized weakness, and mild discomfort in her chest, but denies chest pain or syncope. Her was HR was down to mid 30s and BP 97/51.  She had a nausea and one loose stool bowel movement earlier, which is resolved.  Currently patient does not have chest pain, shortness breath, cough.  No nausea vomiting, diarrhea or abdominal pain.  No unilateral tingling or numbness in extremities.  Denies symptoms of UTI.  ED Course: pt was found to have INR 2.63, WBC 9.1, potassium 3.4, AKI with creatinine 1.37, BUN 14, temperature 97.9, currently heart rate 38-100s and SBP 100s when I saw pt in ED, RR 13, oxygen saturation 89-100% on room air. Patient is placed in stepdown bed for observation.  ED physician discussed with PCCM, Dr. Nelda Marseille.  Poison control was consulted.  Review of Systems:   General: no fevers, chills, no body weight gain, has fatigue and generalized weakness HEENT: no blurry vision, hearing changes or sore throat Respiratory: no dyspnea, coughing, wheezing CV: no chest pain, no palpitations.  Has mild chest discomfort. GI: currently no nausea, vomiting, abdominal pain, diarrhea, constipation GU: no dysuria, burning on urination, increased urinary frequency,  hematuria  Ext: no leg edema Neuro: no unilateral weakness, numbness, or tingling, no vision change or hearing loss Skin: no rash, no skin tear. MSK: No muscle spasm, no deformity, no limitation of range of movement in spin Heme: No easy bruising.  Travel history: No recent long distant travel.  Allergy:  Allergies  Allergen Reactions  . Arthrotec [Diclofenac-Misoprostol] Diarrhea  . Erythromycin Anaphylaxis       . Morphine Sulfate Other (See Comments)    Reaction:  Hallucinations   . Amoxicillin Rash and Other (See Comments)    Has patient had a PCN reaction causing immediate rash, facial/tongue/throat swelling, SOB or lightheadedness with hypotension: Yes Has patient had a PCN reaction causing severe rash involving mucus membranes or skin necrosis: Yes Has patient had a PCN reaction that required hospitalization No Has patient had a PCN reaction occurring within the last 10 years: No If all of the above answers are "NO", then may proceed with Cephalosporin use.  . Ciprofloxacin Rash  . Diltiazem Hcl Swelling, Rash and Other (See Comments)    Pt is able to tolerate Verapamil.    . Gold-Containing Drug Products Rash  . Macrodantin [Nitrofurantoin Macrocrystal] Rash  . Morphine Rash  . Naproxen Rash  . Penicillins Rash and Other (See Comments)    ++ tolerates cefepime and ceftriaxone ++ Has patient had a PCN reaction causing immediate rash, facial/tongue/throat swelling, SOB or lightheadedness with hypotension: Yes Has patient had a PCN reaction causing severe rash involving mucus membranes or skin necrosis: Yes Has patient had a PCN reaction that required  hospitalization No Has patient had a PCN reaction occurring within the last 10 years: No If all of the above answers are "NO", then may proceed with Cephalosporin use.     Past Medical History:  Diagnosis Date  . Acne rosacea   . Adhesive capsulitis of left shoulder 1980  . Allergic rhinitis   . Chronic atrial  fibrillation    a. Coumadin anticoagulation - [facility] ([place])  . Chronic bronchitis ([place])   . Esophageal reflux   . Frequent epistaxis    "cause of my coumadin" (12/16/2015)  . Heart murmur   . History of echocardiogram    a. Echo 4/17:  EF 55-60%, no RWMA, mild MR, mild LAE, PASP 48 mmHg, trivial effusion post to heart  . Hypercholesterolemia   . Hyperlipidemia   . Hypertension   . Osteopenia   . Pleural effusion associated with pulmonary infection    Admx 5/17 >> required L thoracentesis (cytology neg for malignancy)   . Pneumonia    "I've had it 4 times this year" (12/16/2015)  . Polymorphic light eruption 2004  . Prolonged QT interval   . Pulmonary HTN ([place])   . Respiratory failure ([place]) 04/2015   "spent 3 days; went home; then another 13 days in hospital" (12/16/2015  . Rheumatoid arthritis ([place]) 1975  . Right middle ear infection 1986  . Skin cancer    "burndt it off at the right side of my nose/face" (12/16/2015)    Past Surgical History:  Procedure Laterality Date  . BREAST BIOPSY Left 2000s X 2  . CARDIAC CATHETERIZATION  03/2010   a. Myov[place] 2/09: no ischemia; low risk  //  b. [place] 3/12: normal coronary arteries  . CARDIAC CATHETERIZATION N/A 12/16/2015   Procedure: Pericardiocentesis;  Surgeon: Nelva Bush, MD;  Location: [facility];  Service: Cardiovascular;  Laterality: N/A;  . CARDIOVERSION  04/2010  . TEMPOROMANDIBULAR JOINT SURGERY Bilateral   . TOTAL ABDOMINAL HYSTERECTOMY      Social History:  reports that she has never smoked. She has never used smokeless tobacco. She reports that she does not drink alcohol or use drugs.  Family History:  Family History  Problem Relation Age of Onset  . Heart disease Father   . Heart disease Mother   . Other Mother        prolonged qtc  . Heart disease Sister   . Heart disease Sister   . Healthy Sister   . Fainting Neg Hx      Prior to Admission medications   Medication Sig Start Date  End Date Taking? Authorizing Provider  acetaminophen (TYLENOL) 500 MG tablet Take 1,000 mg by mouth every 6 (six) hours as needed for moderate pain.   Yes [provider]  acidophilus (RISAQUAD) CAPS capsule Take 1 capsule by mouth daily.   Yes [provider]  Ascorbic Acid (VITAMIN C) 1000 MG tablet Take 1,000 mg by mouth daily.   Yes [provider]  cholecalciferol (VITAMIN D) 1000 UNITS tablet Take 2,000 Units by mouth daily.   Yes [provider]  etanercept (ENBREL) 50 MG/ML injection Inject 50 mg as directed once a week. Friday   Yes [provider]  fluticasone (FLONASE) 50 MCG/ACT nasal spray Place 2 sprays into both nostrils daily as needed for rhinitis.    Yes [provider]  folic acid (FOLVITE) 1 MG tablet Take 2 mg by mouth daily.    Yes [provider]  furosemide (LASIX) 40  MG tablet TAKE 1 TABLET BY MOUTH EVERY DAY OR AS DIRECTED Patient taking differently: Take 40 mg by mouth daily.  04/27/17  Yes Jettie Booze, MD  MAGNESIUM OXIDE PO Take 1 tablet by mouth daily. With Zinc and Calcium   Yes [provider]  methotrexate (RHEUMATREX) 2.5 MG tablet Take 2.5 mg by mouth 2 (two) times a week. Pt takes 2.5 mg tablets every Tuesday and Wednesday   Yes [provider]  metoprolol succinate (TOPROL-XL) 100 MG 24 hr tablet TAKE 1 TABLET BY MOUTH TWICE DAILY, TAKE WITH OR IMMEDIATELY FOLLOWING A MEAL Patient taking differently: Take 100 mg by mouth 2 (two) times daily. Take with 25 mg tablet 04/27/17  Yes Jettie Booze, MD  metoprolol succinate (TOPROL-XL) 25 MG 24 hr tablet TAKE 1 TABLET BY MOUTH TWICE DAILY(TO TAKE ALONG WITH METOPROLOL XL 100MG  FOR TOTAL OF 125MG  TWICE DAILY) Patient taking differently: Take 25 mg by mouth 2 (two) times daily. Take with 100 mg tablet 04/27/17  Yes Jettie Booze, MD  Multiple Vitamins-Minerals (MULTIVITAMIN WITH MINERALS) tablet Take 1 tablet by mouth  daily.   Yes [provider]  potassium chloride (K-DUR,KLOR-CON) 10 MEQ tablet Take 2 tablets (20 mEq total) by mouth daily. 01/16/16  Yes Barton Dubois, MD  pravastatin (PRAVACHOL) 20 MG tablet Take 20 mg by mouth at bedtime.   Yes [provider]  predniSONE (DELTASONE) 2.5 MG tablet Take 2.5 mg by mouth daily with breakfast.    Yes [provider]  traMADol (ULTRAM) 50 MG tablet Take 50 mg by mouth as needed for pain. 01/29/17  Yes [provider]  verapamil (CALAN) 120 MG tablet Take 1 tablet (120 mg total) by mouth every 12 (twelve) hours. 01/02/16  Yes Florencia Reasons, MD  albuterol (PROVENTIL) (2.5 MG/3ML) 0.083% nebulizer solution Take 3 mLs (2.5 mg total) by nebulization every 6 (six) hours as needed for wheezing or shortness of breath. Patient not taking: Reported on 12/25/2017 03/10/16   Parrett, Fonnie Mu, NP  ranitidine (ZANTAC) 150 MG tablet Take 1 tablet (150 mg total) by mouth at bedtime. Patient not taking: Reported on 12/25/2017 12/31/15   Florencia Reasons, MD  warfarin (COUMADIN) 5 MG tablet Take as directed by coumadin clinic Patient taking differently: Take 5 mg by mouth one time only at 6 PM. 2.5 mg take Monday and Friday Take 5 mg all other days 08/02/17   Jettie Booze, MD    Physical Exam: Vitals:   12/25/17 1715 12/25/17 1730 12/25/17 1800 12/25/17 1830  BP:  104/65 (!) 115/43 111/67  Pulse: (!) 37 (!) 36 (!) 37 73  Resp: 16 17 16 13   Temp:      TempSrc:      SpO2: 97% 95% (!) 89% 100%  Weight:      Height:       General: Not in acute distress HEENT:       Eyes: PERRL, EOMI, no scleral icterus.       ENT: No discharge from the ears and nose, no pharynx injection, no tonsillar enlargement.        Neck: No JVD, no bruit, no mass felt. Heme: No neck lymph node enlargement. Cardiac: S1/S2, RRR, No murmurs, No gallops or rubs. Respiratory:  No rales, wheezing, rhonchi or rubs. GI: Soft, nondistended, nontender, no rebound pain, no  organomegaly, BS present. GU: No hematuria Ext: No pitting leg edema bilaterally. 2+DP/PT pulse bilaterally. Musculoskeletal: No joint deformities, No joint redness or warmth,  no limitation of ROM in spin. Skin: No rashes.  Neuro: Alert, oriented X3, cranial nerves II-XII grossly intact, moves all extremities normally.  Psych: Patient is not psychotic, no suicidal or hemocidal ideation.  Labs on Admission: I have personally reviewed following labs and imaging studies  CBC: Recent Labs  Lab 12/25/17 1538  WBC 9.1  NEUTROABS 6.6  HGB 13.2  HCT 41.9  MCV 105.3*  PLT 532   Basic Metabolic Panel: Recent Labs  Lab 12/25/17 1538  NA 143  K 3.4*  CL 104  CO2 28  GLUCOSE 168*  BUN 14  CREATININE 1.37*  CALCIUM 10.1  MG 2.4   GFR: Estimated Creatinine Clearance: 32.3 mL/min (A) (by C-G formula based on SCr of 1.37 mg/dL (H)). Liver Function Tests: Recent Labs  Lab 12/25/17 1538  AST 38  ALT 34  ALKPHOS 60  BILITOT 0.7  PROT 6.8  ALBUMIN 4.2   No results for input(s): LIPASE, AMYLASE in the last 168 hours. No results for input(s): AMMONIA in the last 168 hours. Coagulation Profile: Recent Labs  Lab 12/25/17 1841  INR 2.63   Cardiac Enzymes: No results for input(s): CKTOTAL, CKMB, CKMBINDEX, TROPONINI in the last 168 hours. BNP (last 3 results) No results for input(s): PROBNP in the last 8760 hours. HbA1C: No results for input(s): HGBA1C in the last 72 hours. CBG: No results for input(s): GLUCAP in the last 168 hours. Lipid Profile: No results for input(s): CHOL, HDL, LDLCALC, TRIG, CHOLHDL, LDLDIRECT in the last 72 hours. Thyroid Function Tests: No results for input(s): TSH, T4TOTAL, FREET4, T3FREE, THYROIDAB in the last 72 hours. Anemia Panel: No results for input(s): VITAMINB12, FOLATE, FERRITIN, TIBC, IRON, RETICCTPCT in the last 72 hours. Urine analysis:    Component Value Date/Time   COLORURINE AMBER (A) 01/08/2016 1507   APPEARANCEUR TURBID (A)  01/08/2016 1507   LABSPEC 1.014 01/08/2016 1507   PHURINE 8.0 01/08/2016 1507   GLUCOSEU NEGATIVE 01/08/2016 1507   HGBUR NEGATIVE 01/08/2016 1507   BILIRUBINUR NEGATIVE 01/08/2016 1507   KETONESUR NEGATIVE 01/08/2016 1507   PROTEINUR 100 (A) 01/08/2016 1507   UROBILINOGEN 0.2 10/29/2013 1932   NITRITE NEGATIVE 01/08/2016 1507   LEUKOCYTESUR NEGATIVE 01/08/2016 1507   Sepsis Labs: @LABRCNTIP (procalcitonin:4,lacticidven:4) )No results found for this or any previous visit (from the past 240 hour(s)).   Radiological Exams on Admission: No results found.   EKG: Independently reviewed.   Atrial fibrillation, bradycardia, QTC 584, low voltage   Assessment/Plan Principal Problem:   Overdose, accidental or unintentional, initial encounter Active Problems:   Hypertension   Hyperlipidemia   Hypokalemia   Rheumatoid arthritis (HCC)   Chronic atrial fibrillation   Long term (current) use of anticoagulants   GERD (gastroesophageal reflux disease)   Chronic diastolic CHF (congestive heart failure) (HCC)   AKI (acute kidney injury) (HCC)   Bradycardia   Overdose of verapamil, accidental or unintentional, initial encounter: Patient developed generalized weakness, mild chest discomfort, bradycardia and a soft blood pressure.  Poison control was contacted.  Per RN's note, poison control recommended: "giving the ordered 930 mg Calcium gluconate and monitor heart rate.  If no increase in heart rate, consider high dose insulin.  High dose insulin administration would require Q30 min CBG monitoring and dextrose infusions PRN.  Insulin can be titrated as needed. Also recommended replacing potassium to have it on the higher end of normal range". PCCM, Dr. Nelda Marseille was consulted today EDP. If pt develops persistent hypotension, will need to start vasopressor, but currently  patient does not need vasopressors.  So far, pt was given 1 mg of glucagon, 1 mg of calcium gluconate, 2 g of magnesium sulfate,  total of 80 mEQ of KCl (10 mEq x 4 by IV and 40 mEQ by oral) in ED. calcium gluconate drip was going on at rate of 1.5 mg elemental calcium/kg/hr. Pt was also given 2L of NS bolus in ED. Dr. Dayna Barker of ED did bedside heart ultrasound which did not show obvious hypokinesis.  -will place on SDU for obs -continue calcium gluconate drip at rate of 0.5 mg elemental calcium/kg/hr.   -tele monitoring -Frequent blood pressure measurement -will give 0.5 mg of atropine (current HR is 38-42)--> will give as needed -will repeat BMP q2h  Bradycardia: Secondary to verapamil overdose - See above -Holding verapamil and metoprolol  Hypertension: Blood pressures are soft -Hold all hypertensive medications: Verapamil, Lasix, metoprolol  Hyperlipidemia: -Pravastatin  Hypokalemia: K=3.4 on admission. - Repleted as above - Check Mg level - 2 g of magnesium sulfate was given  Rheumatoid arthritis ([place]): -Continue methotrexate -Continue prednisone, increased dose from 2.5 mg to 5.0 milligrams daily -Give 1 dose of Solu-Medrol, 40 mg at stress dose  Atrial Fibrillation: CHA2DS2-VASc Score is 5, needs oral anticoagulation. Patient is on Coumadin at home. INR is 2.63 on admission.  -continue coumadin per pharm -hold metoprolol and verapamil  GERD (gastroesophageal reflux disease): -hold Zantac, we will not switch to Pepcid due to QTC prolonged vision  Chronic diastolic CHF (congestive heart failure) ([place]): 2D echo 02/15/2016 showed EF of 55-30%.  Patient does not have leg edema or DVT.  No respiratory distress.  CHF seems to be compensated. Hold Lasix due to soft blood pressure  AKI: Likely due to dehydration and continuation of diuretics - IVF as above - Follow up renal function by BMP - Hold lasix   DVT ppx: on coumadin Code Status: Full code Family Communication: None at bed side.     Disposition Plan:  Anticipate discharge back to previous home environment Consults called:  EDP discussed with  PCCM, Dr. Nelda Marseille Admission status:  SDU/obs     Date of Service 12/25/2017    Ivor Costa [facility] Pager [phone]  If 7PM-7AM, please contact night-coverage [email or web address] Password [facility] 12/25/2017, 7:55 PM

## 2017-12-25 NOTE — ED Notes (Signed)
Spoke with Patty from [employer].  Recommendations include giving the ordered 930 mg Calcium gluconate and monitor heart rate.  If no increase in heart rate, consider high dose insulin.    High dose insulin administration would require Q30 min CBG monitoring and dextrose infusions PRN.  Insulin can be titrated as needed.   Also recommended replacing potassium to have it on the higher end of normal range.

## 2017-12-25 NOTE — ED Triage Notes (Signed)
Pt was at home and became distracted and took too many of her verapamil around 11. Pt HR in 40s and BP is 98/56. Pt reports feeling weak and unable to stand for long periods. Pt denies syncope. Hx of a-fib. Pt CAOx4

## 2017-12-25 NOTE — Telephone Encounter (Signed)
  Pt c/o medication issue:  1. Name of Medication: verapamil (CALAN) 120 MG tablet  2. How are you currently taking this medication (dosage and times per day)? Take 1 tablet (120 mg total) by mouth every 12 (twelve) hours.  3. Are you having a reaction (difficulty breathing--STAT)?  Dizziness, diarrhea  4. What is your medication issue? Patient mixed up her meds and accidentally took 5 or 6 of the verapamil at once. Daughter calling for adivse.

## 2017-12-25 NOTE — ED Notes (Signed)
Bed: MM38 Expected date:  Expected time:  Means of arrival:  Comments: Hold for grant

## 2017-12-26 ENCOUNTER — Other Ambulatory Visit: Payer: Self-pay

## 2017-12-26 DIAGNOSIS — I482 Chronic atrial fibrillation, unspecified: Secondary | ICD-10-CM | POA: Diagnosis present

## 2017-12-26 DIAGNOSIS — E876 Hypokalemia: Secondary | ICD-10-CM | POA: Diagnosis present

## 2017-12-26 DIAGNOSIS — Z7951 Long term (current) use of inhaled steroids: Secondary | ICD-10-CM | POA: Diagnosis not present

## 2017-12-26 DIAGNOSIS — M069 Rheumatoid arthritis, unspecified: Secondary | ICD-10-CM | POA: Diagnosis present

## 2017-12-26 DIAGNOSIS — E785 Hyperlipidemia, unspecified: Secondary | ICD-10-CM | POA: Diagnosis present

## 2017-12-26 DIAGNOSIS — I110 Hypertensive heart disease with heart failure: Secondary | ICD-10-CM | POA: Diagnosis present

## 2017-12-26 DIAGNOSIS — Z7901 Long term (current) use of anticoagulants: Secondary | ICD-10-CM | POA: Diagnosis not present

## 2017-12-26 DIAGNOSIS — R001 Bradycardia, unspecified: Secondary | ICD-10-CM | POA: Diagnosis not present

## 2017-12-26 DIAGNOSIS — K219 Gastro-esophageal reflux disease without esophagitis: Secondary | ICD-10-CM | POA: Diagnosis present

## 2017-12-26 DIAGNOSIS — Z79899 Other long term (current) drug therapy: Secondary | ICD-10-CM | POA: Diagnosis not present

## 2017-12-26 DIAGNOSIS — E7800 Pure hypercholesterolemia, unspecified: Secondary | ICD-10-CM | POA: Diagnosis present

## 2017-12-26 DIAGNOSIS — Z85828 Personal history of other malignant neoplasm of skin: Secondary | ICD-10-CM | POA: Diagnosis not present

## 2017-12-26 DIAGNOSIS — I272 Pulmonary hypertension, unspecified: Secondary | ICD-10-CM | POA: Diagnosis present

## 2017-12-26 DIAGNOSIS — N179 Acute kidney failure, unspecified: Secondary | ICD-10-CM | POA: Diagnosis present

## 2017-12-26 DIAGNOSIS — I5032 Chronic diastolic (congestive) heart failure: Secondary | ICD-10-CM | POA: Diagnosis present

## 2017-12-26 DIAGNOSIS — T50901A Poisoning by unspecified drugs, medicaments and biological substances, accidental (unintentional), initial encounter: Secondary | ICD-10-CM | POA: Diagnosis not present

## 2017-12-26 DIAGNOSIS — T461X1A Poisoning by calcium-channel blockers, accidental (unintentional), initial encounter: Secondary | ICD-10-CM | POA: Diagnosis present

## 2017-12-26 DIAGNOSIS — Z7952 Long term (current) use of systemic steroids: Secondary | ICD-10-CM | POA: Diagnosis not present

## 2017-12-26 DIAGNOSIS — E860 Dehydration: Secondary | ICD-10-CM | POA: Diagnosis present

## 2017-12-26 LAB — BASIC METABOLIC PANEL
ANION GAP: 9 (ref 5–15)
Anion gap: 9 (ref 5–15)
Anion gap: 8 (ref 5–15)
Anion gap: 8 (ref 5–15)
Anion gap: 7 (ref 5–15)
Anion gap: 8 (ref 5–15)
Anion gap: 8 (ref 5–15)
Anion gap: 8 (ref 5–15)
Anion gap: 9 (ref 5–15)
Anion gap: 6 (ref 5–15)
Anion gap: 9 (ref 5–15)
Anion gap: 9 (ref 5–15)
BUN: 11 mg/dL (ref 8–23)
BUN: 11 mg/dL (ref 8–23)
BUN: 12 mg/dL (ref 8–23)
BUN: 12 mg/dL (ref 8–23)
BUN: 12 mg/dL (ref 8–23)
BUN: 12 mg/dL (ref 8–23)
BUN: 12 mg/dL (ref 8–23)
BUN: 12 mg/dL (ref 8–23)
BUN: 13 mg/dL (ref 8–23)
BUN: 13 mg/dL (ref 8–23)
BUN: 14 mg/dL (ref 8–23)
BUN: 14 mg/dL (ref 8–23)
CALCIUM: 11.1 mg/dL — AB (ref 8.9–10.3)
CALCIUM: 9.6 mg/dL (ref 8.9–10.3)
CALCIUM: 9.8 mg/dL (ref 8.9–10.3)
CHLORIDE: 112 mmol/L — AB (ref 98–111)
CHLORIDE: 113 mmol/L — AB (ref 98–111)
CO2: 19 mmol/L — ABNORMAL LOW (ref 22–32)
CO2: 19 mmol/L — ABNORMAL LOW (ref 22–32)
CO2: 19 mmol/L — ABNORMAL LOW (ref 22–32)
CO2: 20 mmol/L — AB (ref 22–32)
CO2: 20 mmol/L — AB (ref 22–32)
CO2: 20 mmol/L — ABNORMAL LOW (ref 22–32)
CO2: 20 mmol/L — ABNORMAL LOW (ref 22–32)
CO2: 20 mmol/L — ABNORMAL LOW (ref 22–32)
CO2: 20 mmol/L — ABNORMAL LOW (ref 22–32)
CO2: 21 mmol/L — ABNORMAL LOW (ref 22–32)
CO2: 21 mmol/L — ABNORMAL LOW (ref 22–32)
CO2: 22 mmol/L (ref 22–32)
CREATININE: 0.89 mg/dL (ref 0.44–1.00)
CREATININE: 0.94 mg/dL (ref 0.44–1.00)
Calcium: 9.6 mg/dL (ref 8.9–10.3)
Calcium: 10.1 mg/dL (ref 8.9–10.3)
Calcium: 10.4 mg/dL — ABNORMAL HIGH (ref 8.9–10.3)
Calcium: 10.5 mg/dL — ABNORMAL HIGH (ref 8.9–10.3)
Calcium: 10.4 mg/dL — ABNORMAL HIGH (ref 8.9–10.3)
Calcium: 10.6 mg/dL — ABNORMAL HIGH (ref 8.9–10.3)
Calcium: 10.8 mg/dL — ABNORMAL HIGH (ref 8.9–10.3)
Calcium: 10.8 mg/dL — ABNORMAL HIGH (ref 8.9–10.3)
Calcium: 10.9 mg/dL — ABNORMAL HIGH (ref 8.9–10.3)
Chloride: 112 mmol/L — ABNORMAL HIGH (ref 98–111)
Chloride: 114 mmol/L — ABNORMAL HIGH (ref 98–111)
Chloride: 115 mmol/L — ABNORMAL HIGH (ref 98–111)
Chloride: 115 mmol/L — ABNORMAL HIGH (ref 98–111)
Chloride: 114 mmol/L — ABNORMAL HIGH (ref 98–111)
Chloride: 113 mmol/L — ABNORMAL HIGH (ref 98–111)
Chloride: 113 mmol/L — ABNORMAL HIGH (ref 98–111)
Chloride: 114 mmol/L — ABNORMAL HIGH (ref 98–111)
Chloride: 112 mmol/L — ABNORMAL HIGH (ref 98–111)
Chloride: 112 mmol/L — ABNORMAL HIGH (ref 98–111)
Creatinine, Ser: 0.95 mg/dL (ref 0.44–1.00)
Creatinine, Ser: 0.92 mg/dL (ref 0.44–1.00)
Creatinine, Ser: 0.9 mg/dL (ref 0.44–1.00)
Creatinine, Ser: 0.88 mg/dL (ref 0.44–1.00)
Creatinine, Ser: 0.88 mg/dL (ref 0.44–1.00)
Creatinine, Ser: 0.88 mg/dL (ref 0.44–1.00)
Creatinine, Ser: 0.82 mg/dL (ref 0.44–1.00)
Creatinine, Ser: 0.75 mg/dL (ref 0.44–1.00)
Creatinine, Ser: 0.82 mg/dL (ref 0.44–1.00)
Creatinine, Ser: 0.85 mg/dL (ref 0.44–1.00)
GFR calc Af Amer: >60 mL/min (ref >60)
GFR calc Af Amer: >60 mL/min (ref >60)
GFR calc Af Amer: >60 mL/min (ref >60)
GFR calc Af Amer: >60 mL/min (ref >60)
GFR calc Af Amer: >60 mL/min (ref >60)
GFR calc Af Amer: >60 mL/min (ref >60)
GFR calc Af Amer: >60 mL/min (ref >60)
GFR calc Af Amer: >60 mL/min (ref >60)
GFR calc Af Amer: >60 mL/min (ref >60)
GFR calc Af Amer: >60 mL/min (ref >60)
GFR calc Af Amer: >60 mL/min (ref >60)
GFR calc Af Amer: >60 mL/min (ref >60)
GFR calc non Af Amer: 59 mL/min — ABNORMAL LOW (ref >60)
GFR calc non Af Amer: >60 mL/min (ref >60)
GFR calc non Af Amer: >60 mL/min (ref >60)
GFR calc non Af Amer: >60 mL/min (ref >60)
GFR calc non Af Amer: >60 mL/min (ref >60)
GFR calc non Af Amer: >60 mL/min (ref >60)
GFR calc non Af Amer: >60 mL/min (ref >60)
GFR calc non Af Amer: >60 mL/min (ref >60)
GFR calc non Af Amer: >60 mL/min (ref >60)
GFR calc non Af Amer: >60 mL/min (ref >60)
GFR, EST NON AFRICAN AMERICAN: 58 mL/min — AB (ref >60)
GLUCOSE: 218 mg/dL — AB (ref 70–99)
Glucose, Bld: 115 mg/dL — ABNORMAL HIGH (ref 70–99)
Glucose, Bld: 116 mg/dL — ABNORMAL HIGH (ref 70–99)
Glucose, Bld: 139 mg/dL — ABNORMAL HIGH (ref 70–99)
Glucose, Bld: 148 mg/dL — ABNORMAL HIGH (ref 70–99)
Glucose, Bld: 148 mg/dL — ABNORMAL HIGH (ref 70–99)
Glucose, Bld: 154 mg/dL — ABNORMAL HIGH (ref 70–99)
Glucose, Bld: 156 mg/dL — ABNORMAL HIGH (ref 70–99)
Glucose, Bld: 168 mg/dL — ABNORMAL HIGH (ref 70–99)
Glucose, Bld: 178 mg/dL — ABNORMAL HIGH (ref 70–99)
Glucose, Bld: 221 mg/dL — ABNORMAL HIGH (ref 70–99)
Glucose, Bld: 88 mg/dL (ref 70–99)
POTASSIUM: 4.9 mmol/L (ref 3.5–5.1)
POTASSIUM: 4.9 mmol/L (ref 3.5–5.1)
Potassium: 4.2 mmol/L (ref 3.5–5.1)
Potassium: 4.4 mmol/L (ref 3.5–5.1)
Potassium: 4.5 mmol/L (ref 3.5–5.1)
Potassium: 4.5 mmol/L (ref 3.5–5.1)
Potassium: 4.5 mmol/L (ref 3.5–5.1)
Potassium: 4.7 mmol/L (ref 3.5–5.1)
Potassium: 4.8 mmol/L (ref 3.5–5.1)
Potassium: 4.8 mmol/L (ref 3.5–5.1)
Potassium: 4.8 mmol/L (ref 3.5–5.1)
Potassium: 5 mmol/L (ref 3.5–5.1)
SODIUM: 140 mmol/L (ref 135–145)
SODIUM: 141 mmol/L (ref 135–145)
SODIUM: 141 mmol/L (ref 135–145)
SODIUM: 141 mmol/L (ref 135–145)
Sodium: 141 mmol/L (ref 135–145)
Sodium: 142 mmol/L (ref 135–145)
Sodium: 143 mmol/L (ref 135–145)
Sodium: 141 mmol/L (ref 135–145)
Sodium: 141 mmol/L (ref 135–145)
Sodium: 142 mmol/L (ref 135–145)
Sodium: 142 mmol/L (ref 135–145)
Sodium: 143 mmol/L (ref 135–145)

## 2017-12-26 LAB — MRSA PCR SCREENING: MRSA by PCR: POSITIVE — AB

## 2017-12-26 LAB — PROTIME-INR
INR: 2.28
Prothrombin Time: 24.8 seconds — ABNORMAL HIGH (ref 11.4–15.2)

## 2017-12-26 LAB — CBC
HCT: 40.3 % (ref 36.0–46.0)
Hemoglobin: 12.5 g/dL (ref 12.0–15.0)
MCH: 33 pg (ref 26.0–34.0)
MCHC: 31 g/dL (ref 30.0–36.0)
MCV: 106.3 fL — ABNORMAL HIGH (ref 80.0–100.0)
Platelets: 163 10*3/uL (ref 150–400)
RBC: 3.79 MIL/uL — ABNORMAL LOW (ref 3.87–5.11)
RDW: 14.6 % (ref 11.5–15.5)
WBC: 5.6 10*3/uL (ref 4.0–10.5)
nRBC: 0 % (ref 0.0–0.2)

## 2017-12-26 LAB — BRAIN NATRIURETIC PEPTIDE: B Natriuretic Peptide: 249 pg/mL — ABNORMAL HIGH (ref 0.0–100.0)

## 2017-12-26 LAB — MAGNESIUM: Magnesium: 2.3 mg/dL (ref 1.7–2.4)

## 2017-12-26 MED ORDER — DIPHENHYDRAMINE HCL 50 MG PO CAPS
50.0000 mg | ORAL_CAPSULE | Freq: Once | ORAL | Status: AC
  Administered 2017-12-26: 50 mg via ORAL
  Filled 2017-12-26: qty 1

## 2017-12-26 MED ORDER — MUPIROCIN 2 % EX OINT
1.0000 "application " | TOPICAL_OINTMENT | Freq: Two times a day (BID) | CUTANEOUS | Status: DC
  Administered 2017-12-26 – 2017-12-30 (×8): 1 via NASAL
  Filled 2017-12-26: qty 22

## 2017-12-26 MED ORDER — CHLORHEXIDINE GLUCONATE CLOTH 2 % EX PADS
6.0000 | MEDICATED_PAD | Freq: Every day | CUTANEOUS | Status: DC
  Administered 2017-12-26 – 2017-12-29 (×3): 6 via TOPICAL

## 2017-12-26 MED ORDER — ORAL CARE MOUTH RINSE
15.0000 mL | Freq: Two times a day (BID) | OROMUCOSAL | Status: DC
  Administered 2017-12-26 – 2017-12-30 (×6): 15 mL via OROMUCOSAL

## 2017-12-26 MED ORDER — ATROPINE SULFATE 1 MG/10ML IJ SOSY
0.5000 mg | PREFILLED_SYRINGE | Freq: Once | INTRAMUSCULAR | Status: AC
  Administered 2017-12-26: 0.5 mg via INTRAVENOUS

## 2017-12-26 NOTE — Progress Notes (Signed)
ANTICOAGULATION CONSULT NOTE - Follow Up Consult  Pharmacy Consult for Warfarin Indication: atrial fibrillation  Allergies  Allergen Reactions  . Arthrotec [Diclofenac-Misoprostol] Diarrhea  . Erythromycin Anaphylaxis       . Morphine Sulfate Other (See Comments)    Reaction:  Hallucinations   . Amoxicillin Rash and Other (See Comments)    Has patient had a PCN reaction causing immediate rash, facial/tongue/throat swelling, SOB or lightheadedness with hypotension: Yes Has patient had a PCN reaction causing severe rash involving mucus membranes or skin necrosis: Yes Has patient had a PCN reaction that required hospitalization No Has patient had a PCN reaction occurring within the last 10 years: No If all of the above answers are "NO", then may proceed with Cephalosporin use.  . Ciprofloxacin Rash  . Diltiazem Hcl Swelling, Rash and Other (See Comments)    Pt is able to tolerate Verapamil.    . Gold-Containing Drug Products Rash  . Macrodantin [Nitrofurantoin Macrocrystal] Rash  . Morphine Rash  . Naproxen Rash  . Penicillins Rash and Other (See Comments)    ++ tolerates cefepime and ceftriaxone ++ Has patient had a PCN reaction causing immediate rash, facial/tongue/throat swelling, SOB or lightheadedness with hypotension: Yes Has patient had a PCN reaction causing severe rash involving mucus membranes or skin necrosis: Yes Has patient had a PCN reaction that required hospitalization No Has patient had a PCN reaction occurring within the last 10 years: No If all of the above answers are "NO", then may proceed with Cephalosporin use.     Patient Measurements: Height: 5\' 3"  (160 cm) Weight: 158 lb 11.7 oz (72 kg) IBW/kg (Calculated) : 52.4   Vital Signs: Temp: 97.6 F (36.4 C) (12/11 0800) Temp Source: Oral (12/11 0800) BP: 119/50 (12/11 1100) Pulse Rate: 82 (12/11 1100)  Labs: Recent Labs    12/25/17 1538 12/25/17 1841  12/26/17 0437 12/26/17 0657 12/26/17 0829  12/26/17 1022  HGB 13.2  --   --  12.5  --   --   --   HCT 41.9  --   --  40.3  --   --   --   PLT 233  --   --  163  --   --   --   LABPROT  --  27.7*  --  24.8*  --   --   --   INR  --  2.63  --  2.28  --   --   --   CREATININE 1.37*  --    < > 0.89 0.90 0.88 0.88   < > = values in this interval not displayed.    Estimated Creatinine Clearance: 51.7 mL/min (by C-G formula based on SCr of 0.88 mg/dL).  Assessment: 77 yo F with accidental verapamil OD.  Pharmacy consulted to dose coumadin.  Pt on coumadin PTA for afib.  Last coumadin clinic visit 12/03/2017: INR 2.9-  Take 5 mg daily X 2.5 mg on Mondays and Fridays. Return visit 12/31/2017 Admission INR is therapeutic at 2.63.  CBC WNL.  No bleeding reported.  Pt took last dose Monday 12/9 coumadin 2.5 mg between 6 and 7 pm per pt interview.  12/26/17  - INR 2.28 - CBC stable - No bleeding reported  Goal of Therapy:  INR 2-3 Monitor platelets by anticoagulation protocol: Yes   Plan:  Continue home dose - 5 mg daily X 2.5 mg Mon/Friday Daily INR Monitor for s/s bleeding   Ulice Dash D 12/26/2017,11:42 AM

## 2017-12-26 NOTE — Progress Notes (Signed)
Spoke with poison control and updated on pt condition, to include labs, vitals, and neuro status. Will continue to monitor.

## 2017-12-26 NOTE — Progress Notes (Signed)
PROGRESS NOTE    [name]  KGM:[id number] DOB: May 10, 1940 DOA: 12/25/2017 PCP: Leeroy Cha, MD    Brief Narrative:  [name] is a 77 y.o. female with medical history significant of atrial fibrillation on Coumadin, hypertension, hyperlipidemia, cardiac, pulmonary hypertension, rheumatoid arthritis, dCHF, who presents with unintentional overdose of verapamil.  Assessment & Plan:   Principal Problem:   Overdose, accidental or unintentional, initial encounter Active Problems:   Hypertension   Hyperlipidemia   Hypokalemia   Rheumatoid arthritis (HCC)   Chronic atrial fibrillation   Long term (current) use of anticoagulants   GERD (gastroesophageal reflux disease)   Chronic diastolic CHF (congestive heart failure) (HCC)   AKI (acute kidney injury) (HCC)   Bradycardia  Overdose of verapamil, accidental or unintentional, initial encounter: Patient developed generalized weakness, mild chest discomfort, bradycardia and a soft blood pressure.  Poison control was contacted.  Per RN's note, poison control recommended: "giving the ordered 930 mg Calcium gluconate and monitor heart rate. If no increase in heart rate, consider high dose insulin. High dose insulin administration would require Q30 min CBG monitoring and dextrose infusions PRN. Insulin can be titrated as needed. Also recommended replacing potassium to have it on the higher end of normal range". PCCM, Dr. Nelda Marseille was consulted. If pt develops persistent hypotension, will need to start vasopressor, but currently patient does not need vasopressors.  So far, pt was given 1 mg of glucagon, 1 mg of calcium gluconate, 2 g of magnesium sulfate, total of 80 mEQ of KCl (10 mEq x 4 by IV and 40 mEQ by oral) in ED. calcium gluconate drip was going on at rate of 1.5 mg elemental calcium/kg/hr. Pt was also given 2L of NS bolus in ED. Dr. Dayna Barker of ED did bedside heart ultrasound which did not show obvious  hypokinesis.  12/26/2017 -continue calcium gluconate drip at rate of 0.5 mg elemental calcium/kg/hr.   -tele monitoring -Frequent blood pressure measurement -will give 0.5 mg of atropine as needed for low HR -monitor BMP  Bradycardia: Secondary to verapamil overdose - See above -Holding verapamil and metoprolol  Hypertension: Blood pressures are soft -Hold all hypertensive medications: Verapamil, Lasix, metoprolol  Hyperlipidemia: -Pravastatin  Hypokalemia: K=3.4 on admission. - Repleted as above - Montior Mg level - 2 g of magnesium sulfate was given  Rheumatoid arthritis ([place]): -Continue methotrexate -Continue prednisone, increased dose from 2.5 mg to 5.0 milligrams daily -Given 1 dose of Solu-Medrol, 40 mg at stress dose  Atrial Fibrillation: CHA2DS2-VASc Score is 5, needs oral anticoagulation. Patient is on Coumadin at home. INR is 2.63 on admission.  -continue coumadin per pharm -hold metoprolol and verapamil  GERD (gastroesophageal reflux disease): -hold Zantac, we will not switch to Pepcid due to QTC prolonged vision  Chronic diastolic CHF (congestive heart failure) ([place]): 2D echo 02/15/2016 showed EF of 55-30%.  Patient does not have leg edema or DVT.  No respiratory distress.  CHF seems to be compensated. Hold Lasix due to soft blood pressure  AKI: Likely due to dehydration and continuation of diuretics - IVF as above - Follow up renal function by BMP - Hold lasix  DVT prophylaxis: Coumadin Code Status: Full Family Communication: No family at bedside Disposition Plan: Home when stable   Consultants:   None   Procedures:  None   Antimicrobials:   None    Subjective: She is complaining of some generalized weakness, dizziness.  Objective: Vitals:   12/26/17 0500 12/26/17 0600 12/26/17 0700 12/26/17 0800  BP: Marland Kitchen)  115/52 123/63 (!) 125/54   Pulse: (!) 56 (!) 56 78   Resp: 18 20 (!) 21   Temp:    97.6 F (36.4 C)  TempSrc:    Oral   SpO2: 95% 95% 97%   Weight:      Height:        Intake/Output Summary (Last 24 hours) at 12/26/2017 1002 Last data filed at 12/26/2017 0700 Gross per 24 hour  Intake 2920.26 ml  Output -  Net 2920.26 ml   Filed Weights   12/25/17 1519 12/26/17 0000  Weight: 68 kg 72 kg    Examination:  General exam: Appears calm and comfortable  Respiratory system: Clear to auscultation. Respiratory effort normal. Cardiovascular system: S1 & S2 heard, RRR. No JVD, murmurs, rubs, gallops or clicks. No pedal edema. Gastrointestinal system: Abdomen is nondistended, soft and nontender. No organomegaly or masses felt. Normal bowel sounds heard. Central nervous system: Alert and oriented. No focal neurological deficits. Extremities: Symmetric 5 x 5 power. Skin: No rashes, lesions or ulcers Psychiatry: Judgement and insight appear normal. Mood & affect appropriate.     Data Reviewed: I have personally reviewed following labs and imaging studies  CBC: Recent Labs  Lab 12/25/17 1538 12/26/17 0437  WBC 9.1 5.6  NEUTROABS 6.6  --   HGB 13.2 12.5  HCT 41.9 40.3  MCV 105.3* 106.3*  PLT 233 867   Basic Metabolic Panel: Recent Labs  Lab 12/25/17 1538  12/26/17 0042 12/26/17 0239 12/26/17 0437 12/26/17 0657 12/26/17 0829  NA 143   < > 140 141 141 142 143  K 3.4*   < > 4.8 4.9 4.9 5.0 4.8  CL 104   < > 112* 113* 114* 115* 115*  CO2 28   < > 19* 20* 19* 20* 20*  GLUCOSE 168*   < > 116* 139* 148* 156* 154*  BUN 14   < > 13 14 13 12 12   CREATININE 1.37*   < > 0.94 0.92 0.89 0.90 0.88  CALCIUM 10.1   < > 9.6 9.8 10.1 10.4* 10.5*  MG 2.4  --   --   --  2.3  --   --    < > = values in this interval not displayed.   GFR: Estimated Creatinine Clearance: 51.7 mL/min (by C-G formula based on SCr of 0.88 mg/dL). Liver Function Tests: Recent Labs  Lab 12/25/17 1538  AST 38  ALT 34  ALKPHOS 60  BILITOT 0.7  PROT 6.8  ALBUMIN 4.2   No results for input(s): LIPASE, AMYLASE in the last  168 hours. No results for input(s): AMMONIA in the last 168 hours. Coagulation Profile: Recent Labs  Lab 12/25/17 1841 12/26/17 0437  INR 2.63 2.28   Cardiac Enzymes: No results for input(s): CKTOTAL, CKMB, CKMBINDEX, TROPONINI in the last 168 hours. BNP (last 3 results) No results for input(s): PROBNP in the last 8760 hours. HbA1C: No results for input(s): HGBA1C in the last 72 hours. CBG: No results for input(s): GLUCAP in the last 168 hours. Lipid Profile: No results for input(s): CHOL, HDL, LDLCALC, TRIG, CHOLHDL, LDLDIRECT in the last 72 hours. Thyroid Function Tests: No results for input(s): TSH, T4TOTAL, FREET4, T3FREE, THYROIDAB in the last 72 hours. Anemia Panel: No results for input(s): VITAMINB12, FOLATE, FERRITIN, TIBC, IRON, RETICCTPCT in the last 72 hours. Sepsis Labs: No results for input(s): PROCALCITON, LATICACIDVEN in the last 168 hours.  Recent Results (from the past 240 hour(s))  MRSA PCR Screening  Status: Abnormal   Collection Time: 12/25/17 11:38 PM  Result Value Ref Range Status   MRSA by PCR POSITIVE (A) NEGATIVE Final    Comment:        The GeneXpert MRSA Assay (FDA approved for NASAL specimens only), is one component of a comprehensive MRSA colonization surveillance program. It is not intended to diagnose MRSA infection nor to guide or monitor treatment for MRSA infections. RESULT CALLED TO, READ BACK BY AND VERIFIED WITH: OLDHAM,S RN AT 0330 12/26/17 BY TIBBITTS,K Performed at [facility], [place] [address] [place], [place] [address]          Radiology Studies: No results found.      Scheduled Meds: . acidophilus  1 capsule Oral Daily  . Chlorhexidine Gluconate Cloth  6 each Topical Q0600  . cholecalciferol  2,000 Units Oral Daily  . folic acid  2 mg Oral Daily  . magnesium oxide  400 mg Oral Daily  . mouth rinse  15 mL Mouth Rinse BID  . methotrexate  2.5 mg Oral Once per day on Tue Wed  . multivitamin  with minerals  1 tablet Oral Daily  . mupirocin ointment  1 application Nasal BID  . pravastatin  20 mg Oral QHS  . predniSONE  5 mg Oral Q breakfast  . vitamin C  1,000 mg Oral Daily  . warfarin  5 mg Oral Once per day on Sun Tue Wed Thu Sat   And  . [START ON 12/28/2017] warfarin  2.5 mg Oral Once per day on Mon Fri  . Warfarin - Pharmacist Dosing Inpatient   Does not apply q1800   Continuous Infusions: . sodium chloride    . calcium gluconate infusion(930 mg elemental calcium/L) 1 mg elemental calcium/kg/hr (12/26/17 0700)     LOS: 0 days    Time spent: 35 min    Nevah Dalal, MD [facility] Please use amion to page  If 7PM-7AM, please contact night-coverage [email or web address] Password [email or web address] 12/26/2017, 10:02 AM

## 2017-12-27 LAB — BASIC METABOLIC PANEL
Anion gap: 6 (ref 5–15)
Anion gap: 7 (ref 5–15)
Anion gap: 8 (ref 5–15)
BUN: 14 mg/dL (ref 8–23)
BUN: 14 mg/dL (ref 8–23)
BUN: 15 mg/dL (ref 8–23)
CO2: 23 mmol/L (ref 22–32)
CO2: 24 mmol/L (ref 22–32)
CO2: 25 mmol/L (ref 22–32)
Calcium: 10.2 mg/dL (ref 8.9–10.3)
Calcium: 10.9 mg/dL — ABNORMAL HIGH (ref 8.9–10.3)
Calcium: 9.7 mg/dL (ref 8.9–10.3)
Chloride: 110 mmol/L (ref 98–111)
Chloride: 111 mmol/L (ref 98–111)
Chloride: 112 mmol/L — ABNORMAL HIGH (ref 98–111)
Creatinine, Ser: 0.78 mg/dL (ref 0.44–1.00)
Creatinine, Ser: 0.81 mg/dL (ref 0.44–1.00)
Creatinine, Ser: 0.84 mg/dL (ref 0.44–1.00)
GFR calc Af Amer: >60 mL/min (ref >60)
GFR calc Af Amer: >60 mL/min (ref >60)
GFR calc Af Amer: >60 mL/min (ref >60)
GFR calc non Af Amer: >60 mL/min (ref >60)
GFR calc non Af Amer: >60 mL/min (ref >60)
GFR calc non Af Amer: >60 mL/min (ref >60)
Glucose, Bld: 103 mg/dL — ABNORMAL HIGH (ref 70–99)
Glucose, Bld: 104 mg/dL — ABNORMAL HIGH (ref 70–99)
Glucose, Bld: 125 mg/dL — ABNORMAL HIGH (ref 70–99)
Potassium: 4.1 mmol/L (ref 3.5–5.1)
Potassium: 4.3 mmol/L (ref 3.5–5.1)
Potassium: 4.5 mmol/L (ref 3.5–5.1)
Sodium: 141 mmol/L (ref 135–145)
Sodium: 142 mmol/L (ref 135–145)
Sodium: 143 mmol/L (ref 135–145)

## 2017-12-27 LAB — COMPREHENSIVE METABOLIC PANEL
ALT: 30 U/L (ref 0–44)
AST: 29 U/L (ref 15–41)
Albumin: 3.5 g/dL (ref 3.5–5.0)
Alkaline Phosphatase: 50 U/L (ref 38–126)
Anion gap: 7 (ref 5–15)
BUN: 13 mg/dL (ref 8–23)
CO2: 22 mmol/L (ref 22–32)
CREATININE: 0.8 mg/dL (ref 0.44–1.00)
Calcium: 10.9 mg/dL — ABNORMAL HIGH (ref 8.9–10.3)
Chloride: 115 mmol/L — ABNORMAL HIGH (ref 98–111)
GFR calc Af Amer: >60 mL/min (ref >60)
GFR calc non Af Amer: >60 mL/min (ref >60)
Glucose, Bld: 122 mg/dL — ABNORMAL HIGH (ref 70–99)
Potassium: 4.3 mmol/L (ref 3.5–5.1)
Sodium: 144 mmol/L (ref 135–145)
Total Bilirubin: 0.5 mg/dL (ref 0.3–1.2)
Total Protein: 5.8 g/dL — ABNORMAL LOW (ref 6.5–8.1)

## 2017-12-27 LAB — CBC
HEMATOCRIT: 37.8 % (ref 36.0–46.0)
Hemoglobin: 11.8 g/dL — ABNORMAL LOW (ref 12.0–15.0)
MCH: 32.9 pg (ref 26.0–34.0)
MCHC: 31.2 g/dL (ref 30.0–36.0)
MCV: 105.3 fL — ABNORMAL HIGH (ref 80.0–100.0)
PLATELETS: 140 10*3/uL — AB (ref 150–400)
RBC: 3.59 MIL/uL — ABNORMAL LOW (ref 3.87–5.11)
RDW: 14.7 % (ref 11.5–15.5)
WBC: 10.5 10*3/uL (ref 4.0–10.5)
nRBC: 0 % (ref 0.0–0.2)

## 2017-12-27 LAB — PROTIME-INR
INR: 2.75
Prothrombin Time: 28.7 seconds — ABNORMAL HIGH (ref 11.4–15.2)

## 2017-12-27 LAB — CALCIUM, IONIZED: Calcium, Ionized, Serum: 5.5 mg/dL (ref 4.5–5.6)

## 2017-12-27 MED ORDER — WARFARIN SODIUM 2.5 MG PO TABS
2.5000 mg | ORAL_TABLET | Freq: Once | ORAL | Status: AC
  Administered 2017-12-27: 2.5 mg via ORAL
  Filled 2017-12-27: qty 1

## 2017-12-27 NOTE — Progress Notes (Signed)
PROGRESS NOTE    [name]  CBS:[id number] DOB: 03/07/1940 DOA: 12/25/2017 PCP: Leeroy Cha, MD    Brief Narrative:  [name] is a 77 y.o. female with medical history significant of atrial fibrillation on Coumadin, hypertension, hyperlipidemia, cardiac, pulmonary hypertension, rheumatoid arthritis, dCHF, who presents with unintentional overdose of verapamil.  Assessment & Plan:   Principal Problem:   Overdose, accidental or unintentional, initial encounter Active Problems:   Hypertension   Hyperlipidemia   Hypokalemia   Rheumatoid arthritis (HCC)   Chronic atrial fibrillation   Long term (current) use of anticoagulants   GERD (gastroesophageal reflux disease)   Chronic diastolic CHF (congestive heart failure) (HCC)   AKI (acute kidney injury) (HCC)   Bradycardia  Overdose of verapamil, accidental or unintentional, initial encounter: Patient developed generalized weakness, mild chest discomfort, bradycardia and a soft blood pressure.  Poison control was contacted.  Per RN's note, poison control recommended: "giving the ordered 930 mg Calcium gluconate and monitor heart rate. If no increase in heart rate, consider high dose insulin. High dose insulin administration would require Q30 min CBG monitoring and dextrose infusions PRN. Insulin can be titrated as needed. Also recommended replacing potassium to have it on the higher end of normal range". PCCM, Dr. Nelda Marseille was consulted. If pt develops persistent hypotension, will need to start vasopressor, but currently patient does not need vasopressors.  So far, pt was given 1 mg of glucagon, 1 mg of calcium gluconate, 2 g of magnesium sulfate, total of 80 mEQ of KCl (10 mEq x 4 by IV and 40 mEQ by oral) in ED. calcium gluconate drip was going on at rate of 1.5 mg elemental calcium/kg/hr. Pt was also given 2L of NS bolus in ED. Dr. Dayna Barker of ED did bedside heart ultrasound which did not show obvious  hypokinesis.  12/26/2017 -continue calcium gluconate drip at rate of 0.5 mg elemental calcium/kg/hr.   -tele monitoring -Frequent blood pressure measurement -will give 0.5 mg of atropine as needed for low HR -monitor BMP 12/27/17: -Calcium gluconate drip discontinued per poison control. -Heart rate better but still a little bradycardic and blood pressure still on the lower side. -Patient also complaining of some dizziness.  Will monitor today and ambulate.  Bradycardia: Secondary to verapamil overdose - See above -Holding verapamil and metoprolol  Hypertension: Blood pressures are soft -Hold all hypertensive medications: Verapamil, Lasix, metoprolol 12/27/17: - Continue to monitor BP.  Hyperlipidemia: -Pravastatin  Hypokalemia: K=3.4 on admission. - Repleted as above - Montior Mg level - 2 g of magnesium sulfate was given  Rheumatoid arthritis ([place]): -Continue methotrexate -Continue prednisone, increased dose from 2.5 mg to 5.0 milligrams daily -Given 1 dose of Solu-Medrol, 40 mg at stress dose  Atrial Fibrillation: CHA2DS2-VASc Score is 5, needs oral anticoagulation. Patient is on Coumadin at home. INR is 2.63 on admission.  -continue coumadin per pharm -hold metoprolol and verapamil  GERD (gastroesophageal reflux disease): -hold Zantac, we will not switch to Pepcid due to QTC prolonged vision  Chronic diastolic CHF (congestive heart failure) ([place]): 2D echo 02/15/2016 showed EF of 55-30%.  Patient does not have leg edema or DVT.  No respiratory distress.  CHF seems to be compensated. Hold Lasix due to soft blood pressure  AKI: Likely due to dehydration and continuation of diuretics - Improved with IVF - Follow up renal function by BMP - Hold lasix  DVT prophylaxis: Coumadin Code Status: Full Family Communication: No family at bedside Disposition Plan: Home when stable  Consultants:   None   Procedures:  None   Antimicrobials:   None     Subjective: She is complaining of dizziness. Yesterday she says she felt "flushed" and thought she had a rash.  However, per nursing no rash noted.  She was given 1 dose of Benadryl.  Objective: Vitals:   12/27/17 0400 12/27/17 0500 12/27/17 0600 12/27/17 0700  BP: (!) 154/62 138/67 (!) 158/86 137/68  Pulse: 62 (!) 52 77 (!) 50  Resp: 19 19 20 19   Temp: 97.8 F (36.6 C)     TempSrc: Oral     SpO2: 97% 97% 97% 94%  Weight:      Height:        Intake/Output Summary (Last 24 hours) at 12/27/2017 0808 Last data filed at 12/27/2017 0618 Gross per 24 hour  Intake 2324.92 ml  Output -  Net 2324.92 ml   Filed Weights   12/25/17 1519 12/26/17 0000  Weight: 68 kg 72 kg    Examination:  General exam: Appears calm and comfortable  Respiratory system: Clear to auscultation. Respiratory effort normal. Cardiovascular system: S1 & S2 heard, RRR. No JVD, murmurs, rubs, gallops or clicks. No pedal edema. Gastrointestinal system: Abdomen is nondistended, soft and nontender. No organomegaly or masses felt. Normal bowel sounds heard. Central nervous system: Alert and oriented. No focal neurological deficits. Extremities: Symmetric 5 x 5 power. Skin: No rashes, lesions or ulcers Psychiatry: Judgement and insight appear normal. Mood & affect appropriate.     Data Reviewed: I have personally reviewed following labs and imaging studies  CBC: Recent Labs  Lab 12/25/17 1538 12/26/17 0437 12/27/17 0334  WBC 9.1 5.6 10.5  NEUTROABS 6.6  --   --   HGB 13.2 12.5 11.8*  HCT 41.9 40.3 37.8  MCV 105.3* 106.3* 105.3*  PLT 233 163 712*   Basic Metabolic Panel: Recent Labs  Lab 12/25/17 1538  12/26/17 0437  12/26/17 1614 12/26/17 1841 12/26/17 2019 12/27/17 0014 12/27/17 0334  NA 143   < > 141   < > 141 142 143 141 144  K 3.4*   < > 4.9   < > 4.7 4.5 4.4 4.5 4.3  CL 104   < > 114*   < > 114* 112* 112* 112* 115*  CO2 28   < > 19*   < > 21* 21* 22 23 22   GLUCOSE 168*   < > 148*    < > 115* 178* 168* 125* 122*  BUN 14   < > 13   < > 11 11 12 14 13   CREATININE 1.37*   < > 0.89   < > 0.75 0.82 0.85 0.81 0.80  CALCIUM 10.1   < > 10.1   < > 10.8* 10.9* 11.1* 10.9* 10.9*  MG 2.4  --  2.3  --   --   --   --   --   --    < > = values in this interval not displayed.   GFR: Estimated Creatinine Clearance: 56.9 mL/min (by C-G formula based on SCr of 0.8 mg/dL). Liver Function Tests: Recent Labs  Lab 12/25/17 1538 12/27/17 0334  AST 38 29  ALT 34 30  ALKPHOS 60 50  BILITOT 0.7 0.5  PROT 6.8 5.8*  ALBUMIN 4.2 3.5   No results for input(s): LIPASE, AMYLASE in the last 168 hours. No results for input(s): AMMONIA in the last 168 hours. Coagulation Profile: Recent Labs  Lab 12/25/17 1841 12/26/17 0437 12/27/17 0334  INR 2.63 2.28 2.75   Cardiac Enzymes: No results for input(s): CKTOTAL, CKMB, CKMBINDEX, TROPONINI in the last 168 hours. BNP (last 3 results) No results for input(s): PROBNP in the last 8760 hours. HbA1C: No results for input(s): HGBA1C in the last 72 hours. CBG: No results for input(s): GLUCAP in the last 168 hours. Lipid Profile: No results for input(s): CHOL, HDL, LDLCALC, TRIG, CHOLHDL, LDLDIRECT in the last 72 hours. Thyroid Function Tests: No results for input(s): TSH, T4TOTAL, FREET4, T3FREE, THYROIDAB in the last 72 hours. Anemia Panel: No results for input(s): VITAMINB12, FOLATE, FERRITIN, TIBC, IRON, RETICCTPCT in the last 72 hours. Sepsis Labs: No results for input(s): PROCALCITON, LATICACIDVEN in the last 168 hours.  Recent Results (from the past 240 hour(s))  MRSA PCR Screening     Status: Abnormal   Collection Time: 12/25/17 11:38 PM  Result Value Ref Range Status   MRSA by PCR POSITIVE (A) NEGATIVE Final    Comment:        The GeneXpert MRSA Assay (FDA approved for NASAL specimens only), is one component of a comprehensive MRSA colonization surveillance program. It is not intended to diagnose MRSA infection nor to guide  or monitor treatment for MRSA infections. RESULT CALLED TO, READ BACK BY AND VERIFIED WITH: OLDHAM,S RN AT 0330 12/26/17 BY TIBBITTS,K Performed at [facility], [place] [address] [place], [place] [address]          Radiology Studies: No results found.      Scheduled Meds: . acidophilus  1 capsule Oral Daily  . Chlorhexidine Gluconate Cloth  6 each Topical Q0600  . cholecalciferol  2,000 Units Oral Daily  . folic acid  2 mg Oral Daily  . magnesium oxide  400 mg Oral Daily  . mouth rinse  15 mL Mouth Rinse BID  . methotrexate  2.5 mg Oral Once per day on Tue Wed  . multivitamin with minerals  1 tablet Oral Daily  . mupirocin ointment  1 application Nasal BID  . pravastatin  20 mg Oral QHS  . predniSONE  5 mg Oral Q breakfast  . vitamin C  1,000 mg Oral Daily  . warfarin  2.5 mg Oral ONCE-1800  . Warfarin - Pharmacist Dosing Inpatient   Does not apply q1800   Continuous Infusions: . sodium chloride    . calcium gluconate infusion(930 mg elemental calcium/L) Stopped (12/27/17 0630)     LOS: 1 day    Time spent: 25 min    Dang Mathison, MD [facility] Please use amion to page  If 7PM-7AM, please contact night-coverage [email or web address] Password [email or web address] 12/27/2017, 8:08 AM

## 2017-12-27 NOTE — Care Management Note (Signed)
Case Management Note  Patient Details  Name: [name] MRN: [id number] Date of Birth: 08-13-1940  Subjective/Objective:                  77 y.o.femalewith medical history significant ofatrial fibrillation on Coumadin, hypertension, hyperlipidemia, cardiac, pulmonary hypertension, rheumatoid arthritis,dCHF, who presents with unintentional overdose of verapamil.  Action/Plan: Will follow for progression of care and clinical status. Will follow for case management needs none present at this time.  Expected Discharge Date:  (unknown)               Expected Discharge Plan:     In-House Referral:     Discharge planning Services     Post Acute Care Choice:    Choice offered to:     DME Arranged:    DME Agency:     HH Arranged:    HH Agency:     Status of Service:     If discussed at [employer], dates discussed:    Additional Comments:  Leeroy Cha, RN 12/27/2017, 8:54 AM

## 2017-12-27 NOTE — Progress Notes (Signed)
Poison control states that patient seems stable, ordered to titrate down on calcium gluconate. MD notified. hospitalist ordered to titrate down 25ml every thirty minutes as vital signs tolerate until drip is discontinued. Will continue to closely monitor at this time.

## 2017-12-27 NOTE — Progress Notes (Signed)
ANTICOAGULATION CONSULT NOTE - Follow Up Consult  Pharmacy Consult for Warfarin Indication: atrial fibrillation  Allergies  Allergen Reactions  . Arthrotec [Diclofenac-Misoprostol] Diarrhea  . Erythromycin Anaphylaxis       . Morphine Sulfate Other (See Comments)    Reaction:  Hallucinations   . Amoxicillin Rash and Other (See Comments)    Has patient had a PCN reaction causing immediate rash, facial/tongue/throat swelling, SOB or lightheadedness with hypotension: Yes Has patient had a PCN reaction causing severe rash involving mucus membranes or skin necrosis: Yes Has patient had a PCN reaction that required hospitalization No Has patient had a PCN reaction occurring within the last 10 years: No If all of the above answers are "NO", then may proceed with Cephalosporin use.  . Ciprofloxacin Rash  . Diltiazem Hcl Swelling, Rash and Other (See Comments)    Pt is able to tolerate Verapamil.    . Gold-Containing Drug Products Rash  . Macrodantin [Nitrofurantoin Macrocrystal] Rash  . Morphine Rash  . Naproxen Rash  . Penicillins Rash and Other (See Comments)    ++ tolerates cefepime and ceftriaxone ++ Has patient had a PCN reaction causing immediate rash, facial/tongue/throat swelling, SOB or lightheadedness with hypotension: Yes Has patient had a PCN reaction causing severe rash involving mucus membranes or skin necrosis: Yes Has patient had a PCN reaction that required hospitalization No Has patient had a PCN reaction occurring within the last 10 years: No If all of the above answers are "NO", then may proceed with Cephalosporin use.     Patient Measurements: Height: 5\' 3"  (160 cm) Weight: 158 lb 11.7 oz (72 kg) IBW/kg (Calculated) : 52.4   Vital Signs: Temp: 97.8 F (36.6 C) (12/12 0400) Temp Source: Oral (12/12 0400) BP: 137/68 (12/12 0700) Pulse Rate: 50 (12/12 0700)  Labs: Recent Labs    12/25/17 1538 12/25/17 1841  12/26/17 0437  12/26/17 2019 12/27/17 0014  12/27/17 0334  HGB 13.2  --   --  12.5  --   --   --  11.8*  HCT 41.9  --   --  40.3  --   --   --  37.8  PLT 233  --   --  163  --   --   --  140*  LABPROT  --  27.7*  --  24.8*  --   --   --  28.7*  INR  --  2.63  --  2.28  --   --   --  2.75  CREATININE 1.37*  --    < > 0.89   < > 0.85 0.81 0.80   < > = values in this interval not displayed.    Estimated Creatinine Clearance: 56.9 mL/min (by C-G formula based on SCr of 0.8 mg/dL).  Assessment: 77 yo F with accidental verapamil OD.  Pharmacy consulted to dose coumadin.  Pt on coumadin PTA for afib.  Last coumadin clinic visit 12/03/2017: INR 2.9-  Take 5 mg daily X 2.5 mg on Mondays and Fridays. Return visit 12/31/2017 Admission INR is therapeutic at 2.63.  CBC WNL.  No bleeding reported.  Pt took last dose Monday 12/9 coumadin 2.5 mg between 6 and 7 pm per pt interview.  12/27/17  - INR 2.75, therapeutic but trending up - CBC decreasing some - No bleeding reported - No drug interactions noted - Tolerating diet  Goal of Therapy:  INR 2-3 Monitor platelets by anticoagulation protocol: Yes   Plan:  Decrease warfarin 2.5mg  PO x  1 today Daily INR Monitor for s/s bleeding   Peggyann Juba, PharmD, BCPS Pager: [phone] 12/27/2017,7:45 AM

## 2017-12-28 LAB — BASIC METABOLIC PANEL
Anion gap: 6 (ref 5–15)
BUN: 20 mg/dL (ref 8–23)
CHLORIDE: 109 mmol/L (ref 98–111)
CO2: 26 mmol/L (ref 22–32)
Calcium: 8.8 mg/dL — ABNORMAL LOW (ref 8.9–10.3)
Creatinine, Ser: 0.79 mg/dL (ref 0.44–1.00)
GFR calc Af Amer: >60 mL/min (ref >60)
GFR calc non Af Amer: >60 mL/min (ref >60)
Glucose, Bld: 91 mg/dL (ref 70–99)
Potassium: 3.7 mmol/L (ref 3.5–5.1)
Sodium: 141 mmol/L (ref 135–145)

## 2017-12-28 LAB — CBC
HCT: 39 % (ref 36.0–46.0)
HEMOGLOBIN: 12.2 g/dL (ref 12.0–15.0)
MCH: 33.2 pg (ref 26.0–34.0)
MCHC: 31.3 g/dL (ref 30.0–36.0)
MCV: 106.3 fL — ABNORMAL HIGH (ref 80.0–100.0)
Platelets: 150 10*3/uL (ref 150–400)
RBC: 3.67 MIL/uL — AB (ref 3.87–5.11)
RDW: 14.6 % (ref 11.5–15.5)
WBC: 7.7 10*3/uL (ref 4.0–10.5)
nRBC: 0 % (ref 0.0–0.2)

## 2017-12-28 LAB — PROTIME-INR
INR: 2.85
Prothrombin Time: 29.5 seconds — ABNORMAL HIGH (ref 11.4–15.2)

## 2017-12-28 MED ORDER — METOPROLOL TARTRATE 25 MG PO TABS
25.0000 mg | ORAL_TABLET | Freq: Two times a day (BID) | ORAL | Status: DC
  Administered 2017-12-28 (×2): 25 mg via ORAL
  Filled 2017-12-28 (×2): qty 1

## 2017-12-28 MED ORDER — PHENOL 1.4 % MT LIQD
1.0000 | OROMUCOSAL | Status: DC | PRN
  Administered 2017-12-29: 1 via OROMUCOSAL
  Filled 2017-12-28: qty 177

## 2017-12-28 MED ORDER — WARFARIN SODIUM 5 MG PO TABS
5.0000 mg | ORAL_TABLET | Freq: Once | ORAL | Status: AC
  Administered 2017-12-28: 5 mg via ORAL
  Filled 2017-12-28: qty 1

## 2017-12-28 MED ORDER — METOPROLOL TARTRATE 12.5 MG HALF TABLET: 12.5000 mg | ORAL_TABLET | Freq: Two times a day (BID) | ORAL | Status: DC

## 2017-12-28 NOTE — Progress Notes (Signed)
ANTICOAGULATION CONSULT NOTE - Follow Up Consult  Pharmacy Consult for Warfarin Indication: atrial fibrillation  Allergies  Allergen Reactions  . Arthrotec [Diclofenac-Misoprostol] Diarrhea  . Erythromycin Anaphylaxis       . Morphine Sulfate Other (See Comments)    Reaction:  Hallucinations   . Amoxicillin Rash and Other (See Comments)    Has patient had a PCN reaction causing immediate rash, facial/tongue/throat swelling, SOB or lightheadedness with hypotension: Yes Has patient had a PCN reaction causing severe rash involving mucus membranes or skin necrosis: Yes Has patient had a PCN reaction that required hospitalization No Has patient had a PCN reaction occurring within the last 10 years: No If all of the above answers are "NO", then may proceed with Cephalosporin use.  . Ciprofloxacin Rash  . Diltiazem Hcl Swelling, Rash and Other (See Comments)    Pt is able to tolerate Verapamil.    . Gold-Containing Drug Products Rash  . Macrodantin [Nitrofurantoin Macrocrystal] Rash  . Morphine Rash  . Naproxen Rash  . Penicillins Rash and Other (See Comments)    ++ tolerates cefepime and ceftriaxone ++ Has patient had a PCN reaction causing immediate rash, facial/tongue/throat swelling, SOB or lightheadedness with hypotension: Yes Has patient had a PCN reaction causing severe rash involving mucus membranes or skin necrosis: Yes Has patient had a PCN reaction that required hospitalization No Has patient had a PCN reaction occurring within the last 10 years: No If all of the above answers are "NO", then may proceed with Cephalosporin use.     Patient Measurements: Height: 5\' 3"  (160 cm) Weight: 158 lb 11.7 oz (72 kg) IBW/kg (Calculated) : 52.4   Vital Signs: Temp: 98 F (36.7 C) (12/13 1200) Temp Source: Oral (12/13 1200) BP: 135/88 (12/13 1200) Pulse Rate: 114 (12/13 1200)  Labs: Recent Labs    12/26/17 0437  12/27/17 0334 12/27/17 0802 12/27/17 1135 12/28/17 0324   HGB 12.5  --  11.8*  --   --  12.2  HCT 40.3  --  37.8  --   --  39.0  PLT 163  --  140*  --   --  150  LABPROT 24.8*  --  28.7*  --   --  29.5*  INR 2.28  --  2.75  --   --  2.85  CREATININE 0.89   < > 0.80 0.84 0.78 0.79   < > = values in this interval not displayed.    Estimated Creatinine Clearance: 56.9 mL/min (by C-G formula based on SCr of 0.79 mg/dL).  Assessment: 77 yo F with accidental verapamil OD.  Pharmacy consulted to dose coumadin.  Pt on coumadin PTA for afib.  Last coumadin clinic visit 12/03/2017: INR 2.9-  Take 5 mg daily X 2.5 mg on Mondays and Fridays. Return visit 12/31/2017 Admission INR is therapeutic at 2.63.  CBC WNL.  No bleeding reported.  Pt took last dose Monday 12/9 coumadin 2.5 mg between 6 and 7 pm per pt interview.  12/28/17  - INR 2.85, therapeutic but trending up - CBC stable - No bleeding reported - No drug interactions noted - Tolerating diet  Goal of Therapy:  INR 2-3 Monitor platelets by anticoagulation protocol: Yes   Plan:  Coumadin 5 mg po x 1 dose today at 1800 Daily INR Monitor for s/s bleeding   Eudelia Bunch, Pharm.D [phone] 12/28/2017 2:06 PM

## 2017-12-28 NOTE — Progress Notes (Signed)
PROGRESS NOTE    [name]  DXI:[id number] DOB: 01-10-1941 DOA: 12/25/2017 PCP: Leeroy Cha, MD    Brief Narrative:  [name] is a 77 y.o. female with medical history significant of atrial fibrillation on Coumadin, hypertension, hyperlipidemia, cardiac, pulmonary hypertension, rheumatoid arthritis, dCHF, who presents with unintentional overdose of verapamil.  Assessment & Plan:   Principal Problem:   Overdose, accidental or unintentional, initial encounter Active Problems:   Hypertension   Hyperlipidemia   Hypokalemia   Rheumatoid arthritis (HCC)   Chronic atrial fibrillation   Long term (current) use of anticoagulants   GERD (gastroesophageal reflux disease)   Chronic diastolic CHF (congestive heart failure) (HCC)   AKI (acute kidney injury) (HCC)   Bradycardia  Overdose of verapamil, accidental or unintentional, initial encounter: Patient developed generalized weakness, mild chest discomfort, bradycardia and a soft blood pressure.  Poison control was contacted.  Per RN's note, poison control recommended: "giving the ordered 930 mg Calcium gluconate and monitor heart rate. If no increase in heart rate, consider high dose insulin. High dose insulin administration would require Q30 min CBG monitoring and dextrose infusions PRN. Insulin can be titrated as needed. Also recommended replacing potassium to have it on the higher end of normal range". PCCM, Dr. Nelda Marseille was consulted. If pt develops persistent hypotension, will need to start vasopressor, but currently patient does not need vasopressors.  So far, pt was given 1 mg of glucagon, 1 mg of calcium gluconate, 2 g of magnesium sulfate, total of 80 mEQ of KCl (10 mEq x 4 by IV and 40 mEQ by oral) in ED. calcium gluconate drip was going on at rate of 1.5 mg elemental calcium/kg/hr. Pt was also given 2L of NS bolus in ED. Dr. Dayna Barker of ED did bedside heart ultrasound which did not show obvious  hypokinesis.  12/26/2017 -continue calcium gluconate drip at rate of 0.5 mg elemental calcium/kg/hr.   -tele monitoring -Frequent blood pressure measurement -will give 0.5 mg of atropine as needed for low HR -monitor BMP 12/27/17: -Calcium gluconate drip discontinued per poison control. -Heart rate better but still a little bradycardic and blood pressure still on the lower side. -Patient also complaining of some dizziness.  Will monitor today and ambulate. 12/28/17: -Patient now becoming tachycardic. -At home she usually takes 125 mg of long-acting metoprolol, 120 mg of verapamil. -We will have to restart her medications slowly and then build her back to her home dose. -We will start low-dose metoprolol and monitor blood pressure and adjust medications accordingly.  Bradycardia: Secondary to verapamil overdose - See above -Holding verapamil and metoprolol 12/28/17: -Today tachycardic.  Plan as mentioned above.  Started her on metoprolol.  Hypertension: Blood pressures are soft -Hold all hypertensive medications: Verapamil, Lasix, metoprolol 12/27/17: - Continue to monitor BP.  12/28/17: -Blood pressure coming back up and tachycardic. -As mentioned above started metoprolol.  Continue to monitor.  Hyperlipidemia: -Pravastatin  Hypokalemia: K=3.4 on admission. - Repleted as above - [place]ior Mg level - 2 g of magnesium sulfate was given 12/28/17: -Electrolytes stable.  Rheumatoid arthritis ([place]): -Continue methotrexate -Continue prednisone, increased dose from 2.5 mg to 5.0 milligrams daily -Given 1 dose of Solu-Medrol, 40 mg at stress dose 12/28/17: - Continue prednisone  Atrial Fibrillation: CHA2DS2-VASc Score is 5, needs oral anticoagulation. Patient is on Coumadin at home. INR is 2.63 on admission.  -continue coumadin per pharm -hold metoprolol and verapamil 12/28/17: -Pharmacy helping to dose Coumadin.  Started metoprolol.  GERD (gastroesophageal reflux  disease): -hold  Zantac, we will not switch to Pepcid due to QTC prolonged vision 12/28/17: - Stable  Chronic diastolic CHF (congestive heart failure) ([place]): 2D echo 02/15/2016 showed EF of 55-30%.  Patient does not have leg edema or DVT.  No respiratory distress.  CHF seems to be compensated. Hold Lasix due to soft blood pressure 12/28/17: -Continue to hold Lasix for now, started metoprolol. -We will have to slowly build her back to her home regimen.  AKI: Likely due to dehydration and continuation of diuretics - Improved with IVF - Follow up renal function by BMP - Hold lasix 12/28/17: -BUN/creatinine improving.  Continue to hold Lasix for now.  DVT prophylaxis: Coumadin Code Status: Full Family Communication: No family at bedside Disposition Plan: Home when stable   Consultants:   None   Procedures:  None   Antimicrobials:   None    Subjective: She is still having occasional dizziness.  She is not tachycardic.  No rash noted.    Objective: Vitals:   12/28/17 0400 12/28/17 0500 12/28/17 0600 12/28/17 0800  BP: 139/88 (!) 140/93 (!) 139/101 127/61  Pulse: (!) 117 84 98 (!) 50  Resp: 18 19 18 18   Temp: 97.9 F (36.6 C)   97.6 F (36.4 C)  TempSrc: Oral   Oral  SpO2: 97% 96% 94% 98%  Weight:      Height:       No intake or output data in the 24 hours ending 12/28/17 1016 Filed Weights   12/25/17 1519 12/26/17 0000  Weight: 68 kg 72 kg    Examination:  General exam: Appears calm and comfortable  Respiratory system: Clear to auscultation. Respiratory effort normal. Cardiovascular system: S1 & S2 heard, RRR. No JVD, murmurs, rubs, gallops or clicks. No pedal edema. Gastrointestinal system: Abdomen is nondistended, soft and nontender. No organomegaly or masses felt. Normal bowel sounds heard. Central nervous system: Alert and oriented. No focal neurological deficits. Extremities: Symmetric 5 x 5 power.  Occasional tremors in the left upper extremity  which the patient says is chronic. Skin: No rashes, lesions or ulcers Psychiatry: Judgement and insight appear normal. Mood & affect appropriate.     Data Reviewed: I have personally reviewed following labs and imaging studies  CBC: Recent Labs  Lab 12/25/17 1538 12/26/17 0437 12/27/17 0334 12/28/17 0324  WBC 9.1 5.6 10.5 7.7  NEUTROABS 6.6  --   --   --   HGB 13.2 12.5 11.8* 12.2  HCT 41.9 40.3 37.8 39.0  MCV 105.3* 106.3* 105.3* 106.3*  PLT 233 163 140* 025   Basic Metabolic Panel: Recent Labs  Lab 12/25/17 1538  12/26/17 0437  12/27/17 0014 12/27/17 0334 12/27/17 0802 12/27/17 1135 12/28/17 0324  NA 143   < > 141   < > 141 144 143 142 141  K 3.4*   < > 4.9   < > 4.5 4.3 4.1 4.3 3.7  CL 104   < > 114*   < > 112* 115* 111 110 109  CO2 28   < > 19*   < > 23 22 24 25 26   GLUCOSE 168*   < > 148*   < > 125* 122* 104* 103* 91  BUN 14   < > 13   < > 14 13 14 15 20   CREATININE 1.37*   < > 0.89   < > 0.81 0.80 0.84 0.78 0.79  CALCIUM 10.1   < > 10.1   < > 10.9* 10.9* 10.2 9.7 8.8*  MG 2.4  --  2.3  --   --   --   --   --   --    < > = values in this interval not displayed.   GFR: Estimated Creatinine Clearance: 56.9 mL/min (by C-G formula based on SCr of 0.79 mg/dL). Liver Function Tests: Recent Labs  Lab 12/25/17 1538 12/27/17 0334  AST 38 29  ALT 34 30  ALKPHOS 60 50  BILITOT 0.7 0.5  PROT 6.8 5.8*  ALBUMIN 4.2 3.5   No results for input(s): LIPASE, AMYLASE in the last 168 hours. No results for input(s): AMMONIA in the last 168 hours. Coagulation Profile: Recent Labs  Lab 12/25/17 1841 12/26/17 0437 12/27/17 0334 12/28/17 0324  INR 2.63 2.28 2.75 2.85   Cardiac Enzymes: No results for input(s): CKTOTAL, CKMB, CKMBINDEX, TROPONINI in the last 168 hours. BNP (last 3 results) No results for input(s): PROBNP in the last 8760 hours. HbA1C: No results for input(s): HGBA1C in the last 72 hours. CBG: No results for input(s): GLUCAP in the last 168  hours. Lipid Profile: No results for input(s): CHOL, HDL, LDLCALC, TRIG, CHOLHDL, LDLDIRECT in the last 72 hours. Thyroid Function Tests: No results for input(s): TSH, T4TOTAL, FREET4, T3FREE, THYROIDAB in the last 72 hours. Anemia Panel: No results for input(s): VITAMINB12, FOLATE, FERRITIN, TIBC, IRON, RETICCTPCT in the last 72 hours. Sepsis Labs: No results for input(s): PROCALCITON, LATICACIDVEN in the last 168 hours.  Recent Results (from the past 240 hour(s))  MRSA PCR Screening     Status: Abnormal   Collection Time: 12/25/17 11:38 PM  Result Value Ref Range Status   MRSA by PCR POSITIVE (A) NEGATIVE Final    Comment:        The GeneXpert MRSA Assay (FDA approved for NASAL specimens only), is one component of a comprehensive MRSA colonization surveillance program. It is not intended to diagnose MRSA infection nor to guide or monitor treatment for MRSA infections. RESULT CALLED TO, READ BACK BY AND VERIFIED WITH: OLDHAM,S RN AT 0330 12/26/17 BY TIBBITTS,K Performed at [facility], [place] [address], [place], [place] [address]          Radiology Studies: No results found.      Scheduled Meds: . acidophilus  1 capsule Oral Daily  . Chlorhexidine Gluconate Cloth  6 each Topical Q0600  . cholecalciferol  2,000 Units Oral Daily  . folic acid  2 mg Oral Daily  . magnesium oxide  400 mg Oral Daily  . mouth rinse  15 mL Mouth Rinse BID  . methotrexate  2.5 mg Oral Once per day on Tue Wed  . metoprolol tartrate  12.5 mg Oral BID  . multivitamin with minerals  1 tablet Oral Daily  . mupirocin ointment  1 application Nasal BID  . pravastatin  20 mg Oral QHS  . predniSONE  5 mg Oral Q breakfast  . vitamin C  1,000 mg Oral Daily  . Warfarin - Pharmacist Dosing Inpatient   Does not apply q1800   Continuous Infusions: . sodium chloride       LOS: 2 days    Time spent: 25 min    Yaakov Guthrie, MD [facility] Please use amion to  page  If 7PM-7AM, please contact night-coverage [email or web address] Password [email or web address] 12/28/2017, 10:16 AM

## 2017-12-29 DIAGNOSIS — K219 Gastro-esophageal reflux disease without esophagitis: Secondary | ICD-10-CM

## 2017-12-29 LAB — PROTIME-INR
INR: 2.52
Prothrombin Time: 26.8 seconds — ABNORMAL HIGH (ref 11.4–15.2)

## 2017-12-29 MED ORDER — MAGNESIUM HYDROXIDE 400 MG/5ML PO SUSP
15.0000 mL | Freq: Once | ORAL | Status: AC
  Administered 2017-12-29: 15 mL via ORAL
  Filled 2017-12-29: qty 30

## 2017-12-29 MED ORDER — METOPROLOL SUCCINATE ER 50 MG PO TB24
50.0000 mg | ORAL_TABLET | Freq: Every day | ORAL | Status: DC
  Administered 2017-12-30: 50 mg via ORAL
  Filled 2017-12-29: qty 1

## 2017-12-29 MED ORDER — VERAPAMIL HCL 120 MG PO TABS
120.0000 mg | ORAL_TABLET | Freq: Every day | ORAL | Status: DC
  Administered 2017-12-30: 120 mg via ORAL
  Filled 2017-12-29: qty 1

## 2017-12-29 MED ORDER — VERAPAMIL HCL 120 MG PO TABS
120.0000 mg | ORAL_TABLET | Freq: Two times a day (BID) | ORAL | Status: DC
  Administered 2017-12-29: 120 mg via ORAL
  Filled 2017-12-29 (×2): qty 1

## 2017-12-29 MED ORDER — METOPROLOL SUCCINATE ER 50 MG PO TB24
50.0000 mg | ORAL_TABLET | Freq: Two times a day (BID) | ORAL | Status: DC
  Administered 2017-12-29: 50 mg via ORAL
  Filled 2017-12-29: qty 1

## 2017-12-29 MED ORDER — WARFARIN SODIUM 5 MG PO TABS
5.0000 mg | ORAL_TABLET | Freq: Once | ORAL | Status: AC
  Administered 2017-12-29: 5 mg via ORAL
  Filled 2017-12-29: qty 1

## 2017-12-29 NOTE — Progress Notes (Signed)
PROGRESS NOTE    [name]  ATF:[id number] DOB: 05/01/1940 DOA: 12/25/2017 PCP: Leeroy Cha, MD    Brief Narrative:  [name] is a 77 y.o. female with medical history significant of atrial fibrillation on Coumadin, hypertension, hyperlipidemia, cardiac, pulmonary hypertension, rheumatoid arthritis, dCHF, who presents with unintentional overdose of verapamil.  Assessment & Plan:   Principal Problem:   Overdose, accidental or unintentional, initial encounter Active Problems:   Hypertension   Hyperlipidemia   Hypokalemia   Rheumatoid arthritis (HCC)   Chronic atrial fibrillation   Long term (current) use of anticoagulants   GERD (gastroesophageal reflux disease)   Chronic diastolic CHF (congestive heart failure) (HCC)   AKI (acute kidney injury) (HCC)   Bradycardia  Overdose of verapamil, accidental or unintentional, initial encounter Currently now tachycardic, BP stable Patient developed generalized weakness, mild chest discomfort, bradycardia and a soft blood pressure.  Poison control was contacted.  Per RN's note, poison control recommended: "giving the ordered 930 mg Calcium gluconate and monitor heart rate. If no increase in heart rate, consider high dose insulin. High dose insulin administration would require Q30 min CBG monitoring and dextrose infusions PRN. Insulin can be titrated as needed. Also recommended replacing potassium to have it on the higher end of normal range". PCCM, Dr. Nelda Marseille was consulted. If pt develops persistent hypotension, will need to start vasopressor, but currently patient does not need vasopressors. Re-start home verapamil, home metoprolol at a reduced dose and adjust accordingly  Hypertension Stable Continue Verapamil, metoprolol  Hyperlipidemia Pravastatin  Hypokalemia Replace prn  Rheumatoid arthritis Continue methotrexate, prednisone S/p 1 dose of Solu-Medrol, 40 mg at stress dose  Atrial  Fibrillation CHA2DS2-VASc Score is 5 Continue coumadin per pharm Continue metoprolol and verapamil  GERD Hold Zantac, we will not switch to Pepcid due to QTC prolonged vision  Chronic diastolic CHF Appears compensated 2D echo 02/15/2016 showed EF of 55-60% Continue to hold Lasix as we add on metoprolol, verapamil  AKI Resolved Continue to hold Lasix   DVT prophylaxis: Coumadin Code Status: Full Family Communication: No family at bedside Disposition Plan: Home when stable, plan for 12/30/2017   Consultants:   PCCM   Procedures:  None   Antimicrobials:   None    Subjective: Reports feeling better, denies any new complaints, denies any chest pain, shortness of breath, dizziness  Objective: Vitals:   12/28/17 2242 12/29/17 0433 12/29/17 0927 12/29/17 1259  BP:  (!) 139/93 (!) 134/92 (!) 117/93  Pulse:  (!) 108 (!) 129 (!) 121  Resp:  18    Temp: 97.7 F (36.5 C) 98.1 F (36.7 C)  99 F (37.2 C)  TempSrc: Oral Oral  Oral  SpO2:  98% 99% 99%  Weight:      Height:        Intake/Output Summary (Last 24 hours) at 12/29/2017 1619 Last data filed at 12/29/2017 1300 Gross per 24 hour  Intake 720 ml  Output 3 ml  Net 717 ml   Filed Weights   12/25/17 1519 12/26/17 0000  Weight: 68 kg 72 kg    Examination:  General: NAD   Cardiovascular: S1, S2 present  Respiratory: CTAB  Abdomen: Soft, nontender, nondistended, bowel sounds present  Musculoskeletal: No bilateral pedal edema noted  Skin: Normal  Psychiatry: Normal mood    Data Reviewed: I have personally reviewed following labs and imaging studies  CBC: Recent Labs  Lab 12/25/17 1538 12/26/17 0437 12/27/17 0334 12/28/17 0324  WBC 9.1 5.6 10.5 7.7  NEUTROABS 6.6  --   --   --   HGB 13.2 12.5 11.8* 12.2  HCT 41.9 40.3 37.8 39.0  MCV 105.3* 106.3* 105.3* 106.3*  PLT 233 163 140* 950   Basic Metabolic Panel: Recent Labs  Lab 12/25/17 1538  12/26/17 0437  12/27/17 0014  12/27/17 0334 12/27/17 0802 12/27/17 1135 12/28/17 0324  NA 143   < > 141   < > 141 144 143 142 141  K 3.4*   < > 4.9   < > 4.5 4.3 4.1 4.3 3.7  CL 104   < > 114*   < > 112* 115* 111 110 109  CO2 28   < > 19*   < > 23 22 24 25 26   GLUCOSE 168*   < > 148*   < > 125* 122* 104* 103* 91  BUN 14   < > 13   < > 14 13 14 15 20   CREATININE 1.37*   < > 0.89   < > 0.81 0.80 0.84 0.78 0.79  CALCIUM 10.1   < > 10.1   < > 10.9* 10.9* 10.2 9.7 8.8*  MG 2.4  --  2.3  --   --   --   --   --   --    < > = values in this interval not displayed.   GFR: Estimated Creatinine Clearance: 56.9 mL/min (by C-G formula based on SCr of 0.79 mg/dL). Liver Function Tests: Recent Labs  Lab 12/25/17 1538 12/27/17 0334  AST 38 29  ALT 34 30  ALKPHOS 60 50  BILITOT 0.7 0.5  PROT 6.8 5.8*  ALBUMIN 4.2 3.5   No results for input(s): LIPASE, AMYLASE in the last 168 hours. No results for input(s): AMMONIA in the last 168 hours. Coagulation Profile: Recent Labs  Lab 12/25/17 1841 12/26/17 0437 12/27/17 0334 12/28/17 0324 12/29/17 0551  INR 2.63 2.28 2.75 2.85 2.52   Cardiac Enzymes: No results for input(s): CKTOTAL, CKMB, CKMBINDEX, TROPONINI in the last 168 hours. BNP (last 3 results) No results for input(s): PROBNP in the last 8760 hours. HbA1C: No results for input(s): HGBA1C in the last 72 hours. CBG: No results for input(s): GLUCAP in the last 168 hours. Lipid Profile: No results for input(s): CHOL, HDL, LDLCALC, TRIG, CHOLHDL, LDLDIRECT in the last 72 hours. Thyroid Function Tests: No results for input(s): TSH, T4TOTAL, FREET4, T3FREE, THYROIDAB in the last 72 hours. Anemia Panel: No results for input(s): VITAMINB12, FOLATE, FERRITIN, TIBC, IRON, RETICCTPCT in the last 72 hours. Sepsis Labs: No results for input(s): PROCALCITON, LATICACIDVEN in the last 168 hours.  Recent Results (from the past 240 hour(s))  MRSA PCR Screening     Status: Abnormal   Collection Time: 12/25/17 11:38 PM   Result Value Ref Range Status   MRSA by PCR POSITIVE (A) NEGATIVE Final    Comment:        The GeneXpert MRSA Assay (FDA approved for NASAL specimens only), is one component of a comprehensive MRSA colonization surveillance program. It is not intended to diagnose MRSA infection nor to guide or monitor treatment for MRSA infections. RESULT CALLED TO, READ BACK BY AND VERIFIED WITH: OLDHAM,S RN AT 0330 12/26/17 BY TIBBITTS,K Performed at [facility], [place] [address], [place], [place] [address]          Radiology Studies: No results found.      Scheduled Meds: . acidophilus  1 capsule Oral Daily  . Chlorhexidine Gluconate Cloth  6 each Topical Q0600  . cholecalciferol  2,000 Units Oral Daily  . folic acid  2 mg Oral Daily  . magnesium oxide  400 mg Oral Daily  . mouth rinse  15 mL Mouth Rinse BID  . methotrexate  2.5 mg Oral Once per day on Tue Wed  . metoprolol succinate  50 mg Oral BID  . multivitamin with minerals  1 tablet Oral Daily  . mupirocin ointment  1 application Nasal BID  . pravastatin  20 mg Oral QHS  . predniSONE  5 mg Oral Q breakfast  . verapamil  120 mg Oral Q12H  . vitamin C  1,000 mg Oral Daily  . warfarin  5 mg Oral ONCE-1800  . Warfarin - Pharmacist Dosing Inpatient   Does not apply q1800   Continuous Infusions: . sodium chloride       LOS: 3 days      Adline Peals Ezenduka,MD [facility] Please use amion to page

## 2017-12-29 NOTE — Plan of Care (Signed)
Patient w/o complaint on 7 a to 7 p shift other than some mild constipation, milk of magnesia given with some success.  Walked entire length of unit multiple times this shift.  HR sustaining in the 60's-80s after addition of Verapamil.  Patient did have  2.41 seconds of slow ventricular rate, MD notified, changes made to evening medications.

## 2017-12-29 NOTE — Progress Notes (Signed)
ANTICOAGULATION CONSULT NOTE - Follow Up Consult  Pharmacy Consult for warfarin Indication: hx atrial fibrillation  Allergies  Allergen Reactions  . Arthrotec [Diclofenac-Misoprostol] Diarrhea  . Erythromycin Anaphylaxis       . Morphine Sulfate Other (See Comments)    Reaction:  Hallucinations   . Amoxicillin Rash and Other (See Comments)    Has patient had a PCN reaction causing immediate rash, facial/tongue/throat swelling, SOB or lightheadedness with hypotension: Yes Has patient had a PCN reaction causing severe rash involving mucus membranes or skin necrosis: Yes Has patient had a PCN reaction that required hospitalization No Has patient had a PCN reaction occurring within the last 10 years: No If all of the above answers are "NO", then may proceed with Cephalosporin use.  . Ciprofloxacin Rash  . Diltiazem Hcl Swelling, Rash and Other (See Comments)    Pt is able to tolerate Verapamil.    . Gold-Containing Drug Products Rash  . Macrodantin [Nitrofurantoin Macrocrystal] Rash  . Morphine Rash  . Naproxen Rash  . Penicillins Rash and Other (See Comments)    ++ tolerates cefepime and ceftriaxone ++ Has patient had a PCN reaction causing immediate rash, facial/tongue/throat swelling, SOB or lightheadedness with hypotension: Yes Has patient had a PCN reaction causing severe rash involving mucus membranes or skin necrosis: Yes Has patient had a PCN reaction that required hospitalization No Has patient had a PCN reaction occurring within the last 10 years: No If all of the above answers are "NO", then may proceed with Cephalosporin use.     Patient Measurements: Height: 5\' 3"  (160 cm) Weight: 158 lb 11.7 oz (72 kg) IBW/kg (Calculated) : 52.4 Heparin Dosing Weight:   Vital Signs: Temp: 98.1 F (36.7 C) (12/14 0433) Temp Source: Oral (12/14 0433) BP: 134/92 (12/14 0927) Pulse Rate: 129 (12/14 0927)  Labs: Recent Labs    12/27/17 0334 12/27/17 0802 12/27/17 1135  12/28/17 0324 12/29/17 0551  HGB 11.8*  --   --  12.2  --   HCT 37.8  --   --  39.0  --   PLT 140*  --   --  150  --   LABPROT 28.7*  --   --  29.5* 26.8*  INR 2.75  --   --  2.85 2.52  CREATININE 0.80 0.84 0.78 0.79  --     Estimated Creatinine Clearance: 56.9 mL/min (by C-G formula based on SCr of 0.79 mg/dL).   Medications:  - PTA warfarin regimen: 5 mg daily except 2.5mg  on Monday and Fri  Assessment: Patient is a 77 y.o F with hx afib on warfarin PTA, presented to the ED in 12/25/17 with verapamil overdose. Warfarin resumed on admission.  Today, 12/29/2017: - INR is therapeutic at 2.52 - cbc was stable with last lab draw on 12/13 - no bleeding documented - no signif. drug-drug intxns  Goal of Therapy:  INR 2-3 Monitor platelets by anticoagulation protocol: Yes   Plan:  - warfarin 5 mg PO x1 - monitor for s/s bleeding   Jaheem Hedgepath P 12/29/2017,10:45 AM

## 2017-12-30 LAB — BASIC METABOLIC PANEL
Anion gap: 8 (ref 5–15)
BUN: 12 mg/dL (ref 8–23)
CO2: 25 mmol/L (ref 22–32)
CREATININE: 0.85 mg/dL (ref 0.44–1.00)
Calcium: 9.2 mg/dL (ref 8.9–10.3)
Chloride: 109 mmol/L (ref 98–111)
GFR calc Af Amer: >60 mL/min (ref >60)
GLUCOSE: 94 mg/dL (ref 70–99)
Potassium: 4.3 mmol/L (ref 3.5–5.1)
Sodium: 142 mmol/L (ref 135–145)

## 2017-12-30 LAB — CBC WITH DIFFERENTIAL/PLATELET
Abs Immature Granulocytes: 0.04 10*3/uL (ref 0.00–0.07)
BASOS PCT: 0 %
Basophils Absolute: 0 10*3/uL (ref 0.0–0.1)
Eosinophils Absolute: 0.2 10*3/uL (ref 0.0–0.5)
Eosinophils Relative: 2 %
HCT: 44.4 % (ref 36.0–46.0)
Hemoglobin: 14.3 g/dL (ref 12.0–15.0)
Immature Granulocytes: 0 %
Lymphocytes Relative: 16 %
Lymphs Abs: 1.4 10*3/uL (ref 0.7–4.0)
MCH: 32.6 pg (ref 26.0–34.0)
MCHC: 32.2 g/dL (ref 30.0–36.0)
MCV: 101.1 fL — ABNORMAL HIGH (ref 80.0–100.0)
Monocytes Absolute: 0.9 10*3/uL (ref 0.1–1.0)
Monocytes Relative: 10 %
Neutro Abs: 6.4 10*3/uL (ref 1.7–7.7)
Neutrophils Relative %: 72 %
Platelets: 170 10*3/uL (ref 150–400)
RBC: 4.39 MIL/uL (ref 3.87–5.11)
RDW: 14.2 % (ref 11.5–15.5)
WBC: 9 10*3/uL (ref 4.0–10.5)
nRBC: 0 % (ref 0.0–0.2)

## 2017-12-30 LAB — PROTIME-INR
INR: 2.31
PROTHROMBIN TIME: 25 s — AB (ref 11.4–15.2)

## 2017-12-30 LAB — MAGNESIUM: Magnesium: 2.4 mg/dL (ref 1.7–2.4)

## 2017-12-30 MED ORDER — WARFARIN SODIUM 5 MG PO TABS: 5.0000 mg | ORAL_TABLET | Freq: Once | ORAL | Status: DC

## 2017-12-30 MED ORDER — METOPROLOL SUCCINATE ER 50 MG PO TB24: 50.0000 mg | ORAL_TABLET | Freq: Two times a day (BID) | ORAL | 0 refills | Status: DC

## 2017-12-30 MED ORDER — VERAPAMIL HCL 120 MG PO TABS: 120.0000 mg | ORAL_TABLET | Freq: Every day | ORAL | 0 refills | Status: DC

## 2017-12-30 MED ORDER — MENTHOL 5.4 MG MT LOZG: 1.0000 | LOZENGE | OROMUCOSAL | 0 refills | Status: AC | PRN

## 2017-12-30 NOTE — Discharge Summary (Addendum)
Discharge Summary  [name] QTM:[id number] DOB: 09/04/1940  PCP: Leeroy Cha, MD  Admit date: 12/25/2017 Discharge date: 12/30/2017  Time spent: 35 mins  Recommendations for Outpatient Follow-up:  1. PCP within 1 week to follow up on HR and adjust medications 2. Pt already has a follow up appointment with cardiology   Discharge Diagnoses:  Active Hospital Problems   Diagnosis Date Noted  . Overdose, accidental or unintentional, initial encounter 12/25/2017  . Chronic diastolic CHF (congestive heart failure) ([place]) 12/25/2017  . AKI (acute kidney injury) ([place]) 12/25/2017  . Bradycardia 12/25/2017  . GERD (gastroesophageal reflux disease) 12/15/2015  . Chronic atrial fibrillation   . Long term (current) use of anticoagulants   . Rheumatoid arthritis ([place]) 04/24/2015  . Hypokalemia 04/24/2015  . Hyperlipidemia 05/15/2013  . Hypertension 05/15/2013    Resolved Hospital Problems  No resolved problems to display.    Discharge Condition: Stable  Diet recommendation: Heart healthy   Vitals:   12/29/17 2111 12/30/17 0434  BP: 123/77 125/87  Pulse: (!) 105 (!) 109  Resp: 18 18  Temp: 98.5 F (36.9 C) 98.7 F (37.1 C)  SpO2: 100% 97%    History of present illness:  [name] Sheltonis a 77 y.o.femalewith medical history significant ofatrial fibrillation on Coumadin, hypertension, hyperlipidemia, cardiac, pulmonary hypertension, rheumatoid arthritis,dCHF, who presents with unintentional overdose of verapamil.   Today, pt reported feeling better, denies any chest pain, SOB, palpitations, abdominal pain, N/V/D/C, fever/chills. Pt ambulated the hallway several times without any issues. Pt very eager to be discharged. Pt advised to follow up with her PCP within 1 week for further dose adjustment, pending on her HR/BP.   Hospital Course:  Principal Problem:   Overdose, accidental or unintentional, initial encounter Active Problems:   Hypertension  Hyperlipidemia   Hypokalemia   Rheumatoid arthritis (H[facility])   Chronic atrial fibrillation   Long term (current) use of anticoagulants   GERD (gastroesophageal reflux disease)   Chronic diastolic CHF (congestive heart failure) (H[facility])   AKI (acute kidney injury) (H[facility])   Bradycardia  Overdoseofverapamil, accidental or unintentional, initial encounter Currently now tachycardic, with HR in the early 100s, BP stable On presentation, patient developed generalized weakness, mild chest discomfort, bradycardia and a soft blood pressure. Poison control was contacted and recommendations were followed.  Re-started home verapamil daily instead of BID, home metoprolol at a reduced dose PCP to follow up patient and adjust accordingly pending HR and BP  Hypertension Stable Continue Verapamil, metoprolol as above  Hyperlipidemia Pravastatin  Hypokalemia Replaced prn  Rheumatoid arthritis Continue methotrexate, prednisone  Chronic Atrial Fibrillation CHA2DS2-VASc Scoreis 5 Continue coumadin per pharm Continue metoprolol and verapamil as above  Chronic diastolic CHF Appears compensated 2D echo 02/15/2016 showed EF of 55-60% Continue home Lasix  AKI Resolved       Malnutrition Type:      Malnutrition Characteristics:      Nutrition Interventions:      Estimated body mass index is 28.12 kg/m as calculated from the following:   Height as of this encounter: 5\' 3"  (1.6 m).   Weight as of this encounter: 72 kg.    Procedures:  None  Consultations:  None   Discharge Exam: BP 125/87 (BP Location: Right Arm)   Pulse (!) 109   Temp 98.7 F (37.1 C) (Oral)   Resp 18   Ht 5\' 3"  (1.6 m)   Wt 72 kg   SpO2 97%   BMI 28.12 kg/m   General: NAD  Cardiovascular:  S1, S2 present, irregular Respiratory: CTAB  Discharge Instructions You were cared for by a hospitalist during your hospital stay. If you have any questions about your discharge medications or the  care you received while you were in the hospital after you are discharged, you can call the unit and asked to speak with the hospitalist on call if the hospitalist that took care of you is not available. Once you are discharged, your primary care physician will handle any further medical issues. Please note that NO REFILLS for any discharge medications will be authorized once you are discharged, as it is imperative that you return to your primary care physician (or establish a relationship with a primary care physician if you do not have one) for your aftercare needs so that they can reassess your need for medications and monitor your lab values.   Allergies as of 12/30/2017      Reactions   Arthrotec [diclofenac-misoprostol] Diarrhea   Erythromycin Anaphylaxis      Morphine Sulfate Other (See Comments)   Reaction:  Hallucinations    Amoxicillin Rash, Other (See Comments)   Has patient had a PCN reaction causing immediate rash, facial/tongue/throat swelling, SOB or lightheadedness with hypotension: Yes Has patient had a PCN reaction causing severe rash involving mucus membranes or skin necrosis: Yes Has patient had a PCN reaction that required hospitalization No Has patient had a PCN reaction occurring within the last 10 years: No If all of the above answers are "NO", then may proceed with Cephalosporin use.   Ciprofloxacin Rash   Diltiazem Hcl Swelling, Rash, Other (See Comments)   Pt is able to tolerate Verapamil.     Gold-containing Drug Products Rash   Macrodantin [nitrofurantoin Macrocrystal] Rash   Morphine Rash   Naproxen Rash   Penicillins Rash, Other (See Comments)   ++ tolerates cefepime and ceftriaxone ++ Has patient had a PCN reaction causing immediate rash, facial/tongue/throat swelling, SOB or lightheadedness with hypotension: Yes Has patient had a PCN reaction causing severe rash involving mucus membranes or skin necrosis: Yes Has patient had a PCN reaction that required  hospitalization No Has patient had a PCN reaction occurring within the last 10 years: No If all of the above answers are "NO", then may proceed with Cephalosporin use.      Medication List    STOP taking these medications   ranitidine 150 MG tablet Commonly known as:  ZANTAC     TAKE these medications   acetaminophen 500 MG tablet Commonly known as:  TYLENOL Take 1,000 mg by mouth every 6 (six) hours as needed for moderate pain.   acidophilus Caps capsule Take 1 capsule by mouth daily.   albuterol (2.5 MG/3ML) 0.083% nebulizer solution Commonly known as:  PROVENTIL Take 3 mLs (2.5 mg total) by nebulization every 6 (six) hours as needed for wheezing or shortness of breath.   cholecalciferol 1000 units tablet Commonly known as:  VITAMIN D Take 2,000 Units by mouth daily.   ENBREL 50 MG/ML injection Generic drug:  etanercept Inject 50 mg as directed once a week. Friday   fluticasone 50 MCG/ACT nasal spray Commonly known as:  FLONASE Place 2 sprays into both nostrils daily as needed for rhinitis.   folic acid 1 MG tablet Commonly known as:  FOLVITE Take 2 mg by mouth daily.   furosemide 40 MG tablet Commonly known as:  LASIX TAKE 1 TABLET BY MOUTH EVERY DAY OR AS DIRECTED What changed:  See the new instructions.   MAGNESIUM  OXIDE PO Take 1 tablet by mouth daily. With Zinc and Calcium   Menthol 5.4 MG Lozg Use as directed 1 lozenge (5.4 mg total) in the mouth or throat every 4 (four) hours as needed for up to 5 days.   methotrexate 2.5 MG tablet Commonly known as:  RHEUMATREX Take 2.5 mg by mouth 2 (two) times a week. Pt takes 2.5 mg tablets every Tuesday and Wednesday   metoprolol succinate 50 MG 24 hr tablet Commonly known as:  TOPROL-XL Take 1 tablet (50 mg total) by mouth 2 (two) times daily with a meal. Take with or immediately following a meal. What changed:    medication strength  See the new instructions.  Another medication with the same name was  removed. Continue taking this medication, and follow the directions you see here.   multivitamin with minerals tablet Take 1 tablet by mouth daily.   potassium chloride 10 MEQ tablet Commonly known as:  K-DUR,KLOR-CON Take 2 tablets (20 mEq total) by mouth daily.   pravastatin 20 MG tablet Commonly known as:  PRAVACHOL Take 20 mg by mouth at bedtime.   predniSONE 2.5 MG tablet Commonly known as:  DELTASONE Take 2.5 mg by mouth daily with breakfast.   traMADol 50 MG tablet Commonly known as:  ULTRAM Take 50 mg by mouth as needed for pain.   verapamil 120 MG tablet Commonly known as:  CALAN Take 1 tablet (120 mg total) by mouth daily. What changed:  when to take this   vitamin C 1000 MG tablet Take 1,000 mg by mouth daily.   warfarin 5 MG tablet Commonly known as:  COUMADIN Take as directed. If you are unsure how to take this medication, talk to your nurse or doctor. Original instructions:  Take as directed by coumadin clinic What changed:    how much to take  how to take this  when to take this  additional instructions      Allergies  Allergen Reactions  . Arthrotec [Diclofenac-Misoprostol] Diarrhea  . Erythromycin Anaphylaxis       . Morphine Sulfate Other (See Comments)    Reaction:  Hallucinations   . Amoxicillin Rash and Other (See Comments)    Has patient had a PCN reaction causing immediate rash, facial/tongue/throat swelling, SOB or lightheadedness with hypotension: Yes Has patient had a PCN reaction causing severe rash involving mucus membranes or skin necrosis: Yes Has patient had a PCN reaction that required hospitalization No Has patient had a PCN reaction occurring within the last 10 years: No If all of the above answers are "NO", then may proceed with Cephalosporin use.  . Ciprofloxacin Rash  . Diltiazem Hcl Swelling, Rash and Other (See Comments)    Pt is able to tolerate Verapamil.    . Gold-Containing Drug Products Rash  . Macrodantin  [Nitrofurantoin Macrocrystal] Rash  . Morphine Rash  . Naproxen Rash  . Penicillins Rash and Other (See Comments)    ++ tolerates cefepime and ceftriaxone ++ Has patient had a PCN reaction causing immediate rash, facial/tongue/throat swelling, SOB or lightheadedness with hypotension: Yes Has patient had a PCN reaction causing severe rash involving mucus membranes or skin necrosis: Yes Has patient had a PCN reaction that required hospitalization No Has patient had a PCN reaction occurring within the last 10 years: No If all of the above answers are "NO", then may proceed with Cephalosporin use.    Follow-up Information    Leeroy Cha, MD. Schedule an appointment as soon as possible  for a visit in 1 week(s).   Specialty:  Internal Medicine Contact information: [address] [place] [address] [phone]        Jettie Booze, MD .   Specialties:  Cardiology, Radiology, Interventional Cardiology Contact information: [address] [place] [address] [phone]            The results of significant diagnostics from this hospitalization (including imaging, microbiology, ancillary and laboratory) are listed below for reference.    Significant Diagnostic Studies: No results found.  Microbiology: Recent Results (from the past 240 hour(s))  MRSA PCR Screening     Status: Abnormal   Collection Time: 12/25/17 11:38 PM  Result Value Ref Range Status   MRSA by PCR POSITIVE (A) NEGATIVE Final    Comment:        The GeneXpert MRSA Assay (FDA approved for NASAL specimens only), is one component of a comprehensive MRSA colonization surveillance program. It is not intended to diagnose MRSA infection nor to guide or monitor treatment for MRSA infections. RESULT CALLED TO, READ BACK BY AND VERIFIED WITH: OLDHAM,S RN AT 0330 12/26/17 BY TIBBITTS,K Performed at [facility], [place] [address],  [place], [place] [address]      Labs: Basic Metabolic Panel: Recent Labs  Lab 12/25/17 1538  12/26/17 0437  12/27/17 0334 12/27/17 0802 12/27/17 1135 12/28/17 0324 12/30/17 0620  NA 143   < > 141   < > 144 143 142 141 142  K 3.4*   < > 4.9   < > 4.3 4.1 4.3 3.7 4.3  CL 104   < > 114*   < > 115* 111 110 109 109  CO2 28   < > 19*   < > 22 24 25 26 25   GLUCOSE 168*   < > 148*   < > 122* 104* 103* 91 94  BUN 14   < > 13   < > 13 14 15 20 12   CREATININE 1.37*   < > 0.89   < > 0.80 0.84 0.78 0.79 0.85  CALCIUM 10.1   < > 10.1   < > 10.9* 10.2 9.7 8.8* 9.2  MG 2.4  --  2.3  --   --   --   --   --  2.4   < > = values in this interval not displayed.   Liver Function Tests: Recent Labs  Lab 12/25/17 1538 12/27/17 0334  AST 38 29  ALT 34 30  ALKPHOS 60 50  BILITOT 0.7 0.5  PROT 6.8 5.8*  ALBUMIN 4.2 3.5   No results for input(s): LIPASE, AMYLASE in the last 168 hours. No results for input(s): AMMONIA in the last 168 hours. CBC: Recent Labs  Lab 12/25/17 1538 12/26/17 0437 12/27/17 0334 12/28/17 0324 12/30/17 0620  WBC 9.1 5.6 10.5 7.7 9.0  NEUTROABS 6.6  --   --   --  6.4  HGB 13.2 12.5 11.8* 12.2 14.3  HCT 41.9 40.3 37.8 39.0 44.4  MCV 105.3* 106.3* 105.3* 106.3* 101.1*  PLT 233 163 140* 150 170   Cardiac Enzymes: No results for input(s): CKTOTAL, CKMB, CKMBINDEX, TROPONINI in the last 168 hours. BNP: BNP (last 3 results) Recent Labs    12/25/17 2245  BNP 249.0*    ProBNP (last 3 results) No results for input(s): PROBNP in the last 8760 hours.  CBG: No results for input(s): GLUCAP in the last 168 hours.  Signed:  Alma Friendly, MD [facility] 12/30/2017, 11:17 AM

## 2017-12-30 NOTE — Progress Notes (Signed)
ANTICOAGULATION CONSULT NOTE - Follow Up Consult  Pharmacy Consult for warfarin Indication: hx atrial fibrillation  Allergies  Allergen Reactions  . Arthrotec [Diclofenac-Misoprostol] Diarrhea  . Erythromycin Anaphylaxis       . Morphine Sulfate Other (See Comments)    Reaction:  Hallucinations   . Amoxicillin Rash and Other (See Comments)    Has patient had a PCN reaction causing immediate rash, facial/tongue/throat swelling, SOB or lightheadedness with hypotension: Yes Has patient had a PCN reaction causing severe rash involving mucus membranes or skin necrosis: Yes Has patient had a PCN reaction that required hospitalization No Has patient had a PCN reaction occurring within the last 10 years: No If all of the above answers are "NO", then may proceed with Cephalosporin use.  . Ciprofloxacin Rash  . Diltiazem Hcl Swelling, Rash and Other (See Comments)    Pt is able to tolerate Verapamil.    . Gold-Containing Drug Products Rash  . Macrodantin [Nitrofurantoin Macrocrystal] Rash  . Morphine Rash  . Naproxen Rash  . Penicillins Rash and Other (See Comments)    ++ tolerates cefepime and ceftriaxone ++ Has patient had a PCN reaction causing immediate rash, facial/tongue/throat swelling, SOB or lightheadedness with hypotension: Yes Has patient had a PCN reaction causing severe rash involving mucus membranes or skin necrosis: Yes Has patient had a PCN reaction that required hospitalization No Has patient had a PCN reaction occurring within the last 10 years: No If all of the above answers are "NO", then may proceed with Cephalosporin use.     Patient Measurements: Height: 5\' 3"  (160 cm) Weight: 158 lb 11.7 oz (72 kg) IBW/kg (Calculated) : 52.4 Heparin Dosing Weight:   Vital Signs: Temp: 98.7 F (37.1 C) (12/15 0434) Temp Source: Oral (12/15 0434) BP: 125/87 (12/15 0434) Pulse Rate: 109 (12/15 0434)  Labs: Recent Labs    12/27/17 1135 12/28/17 0324 12/29/17 0551  12/30/17 0620  HGB  --  12.2  --  14.3  HCT  --  39.0  --  44.4  PLT  --  150  --  170  LABPROT  --  29.5* 26.8* 25.0*  INR  --  2.85 2.52 2.31  CREATININE 0.78 0.79  --  0.85    Estimated Creatinine Clearance: 53.5 mL/min (by C-G formula based on SCr of 0.85 mg/dL).   Medications:  - PTA warfarin regimen: 5 mg daily except 2.5mg  on Monday and Fri  Assessment: Patient is a 77 y.o F with hx afib on warfarin PTA, presented to the ED in 12/25/17 with verapamil overdose. Warfarin resumed on admission.  Today, 12/30/2017: - INR remains therapeutic at 2.31 - cbc stable - no bleeding documented - no signif. drug-drug intxns  Goal of Therapy:  INR 2-3 Monitor platelets by anticoagulation protocol: Yes   Plan:  - warfarin 5 mg PO x1 - monitor for s/s bleeding   Ziare Orrick P 12/30/2017,9:37 AM

## 2017-12-31 ENCOUNTER — Ambulatory Visit (INDEPENDENT_AMBULATORY_CARE_PROVIDER_SITE_OTHER): Payer: Medicare Other | Admitting: *Deleted

## 2017-12-31 DIAGNOSIS — Z5181 Encounter for therapeutic drug level monitoring: Secondary | ICD-10-CM | POA: Diagnosis not present

## 2017-12-31 DIAGNOSIS — Z7901 Long term (current) use of anticoagulants: Secondary | ICD-10-CM

## 2017-12-31 DIAGNOSIS — I4891 Unspecified atrial fibrillation: Secondary | ICD-10-CM | POA: Diagnosis not present

## 2017-12-31 LAB — POCT INR: INR: 3 (ref 2.0–3.0)

## 2017-12-31 NOTE — Patient Instructions (Signed)
Description   Continue on same dosage 1 tablet daily except 1/2 tablet on Mondays and Fridays.   Recheck in 2 weeks. Call us with any medication changes or bleeding concerns, [phone], Main [phone].

## 2018-01-02 ENCOUNTER — Other Ambulatory Visit: Payer: Self-pay | Admitting: Interventional Cardiology

## 2018-01-14 ENCOUNTER — Ambulatory Visit (INDEPENDENT_AMBULATORY_CARE_PROVIDER_SITE_OTHER): Payer: Medicare Other | Admitting: *Deleted

## 2018-01-14 DIAGNOSIS — Z7901 Long term (current) use of anticoagulants: Secondary | ICD-10-CM

## 2018-01-14 DIAGNOSIS — I4891 Unspecified atrial fibrillation: Secondary | ICD-10-CM

## 2018-01-14 DIAGNOSIS — Z5181 Encounter for therapeutic drug level monitoring: Secondary | ICD-10-CM | POA: Diagnosis not present

## 2018-01-14 LAB — POCT INR: INR: 2.7 (ref 2.0–3.0)

## 2018-01-14 NOTE — Patient Instructions (Signed)
Description   Continue on same dosage 1 tablet daily except 1/2 tablet on Mondays and Fridays.   Recheck in 4 weeks. Call us with any medication changes or bleeding concerns, [phone], Main [phone].

## 2018-01-28 ENCOUNTER — Encounter: Payer: Self-pay | Admitting: Interventional Cardiology

## 2018-01-28 ENCOUNTER — Ambulatory Visit (INDEPENDENT_AMBULATORY_CARE_PROVIDER_SITE_OTHER): Payer: Medicare Other | Admitting: Interventional Cardiology

## 2018-01-28 VITALS — BP 110/72 | HR 72 | Ht 63.0 in | Wt 154.0 lb

## 2018-01-28 DIAGNOSIS — I4821 Permanent atrial fibrillation: Secondary | ICD-10-CM | POA: Diagnosis not present

## 2018-01-28 DIAGNOSIS — R0609 Other forms of dyspnea: Secondary | ICD-10-CM | POA: Diagnosis not present

## 2018-01-28 DIAGNOSIS — Z7901 Long term (current) use of anticoagulants: Secondary | ICD-10-CM | POA: Diagnosis not present

## 2018-01-28 DIAGNOSIS — I10 Essential (primary) hypertension: Secondary | ICD-10-CM

## 2018-01-28 MED ORDER — VERAPAMIL HCL 120 MG PO TABS: 120.0000 mg | ORAL_TABLET | Freq: Every day | ORAL | 3 refills | Status: AC

## 2018-01-28 NOTE — Patient Instructions (Signed)
Medication Instructions:  Your physician recommends that you continue on your current medications as directed. Please refer to the Current Medication list given to you today.  If you need a refill on your cardiac medications before your next appointment, please call your pharmacy.   Lab work: None Ordered  If you have labs (blood work) drawn today and your tests are completely normal, you will receive your results only by: . MyChart Message (if you have MyChart) OR . A paper copy in the mail If you have any lab test that is abnormal or we need to change your treatment, we will call you to review the results.  Testing/Procedures: None ordered  Follow-Up: At [facility], you and your health needs are our priority.  As part of our continuing mission to provide you with exceptional heart care, we have created designated Provider Care Teams.  These Care Teams include your primary Cardiologist (physician) and Advanced Practice Providers (APPs -  Physician Assistants and Nurse Practitioners) who all work together to provide you with the care you need, when you need it. . You will need a follow up appointment in 9 months.  Please call our office 2 months in advance to schedule this appointment.  You may see Jay Varanasi, MD or one of the following Advanced Practice Providers on your designated Care Team:   . Brittainy Simmons, PA-C . Dayna Dunn, PA-C . Michele Lenze, PA-C  Any Other Special Instructions Will Be Listed Below (If Applicable).    

## 2018-01-28 NOTE — Progress Notes (Signed)
Cardiology Office Note   Date:  01/28/2018   ID:  [name], DOB [birth year], MRN [id number]  PCP:  Leeroy Cha, MD    No chief complaint on file.  AFib  Wt Readings from Last 3 Encounters:  01/28/18 154 lb (69.9 kg)  12/26/17 158 lb 11.7 oz (72 kg)  03/19/17 154 lb 3.2 oz (69.9 kg)       History of Present Illness: [name] is a 78 y.o. female  with a hx of chronic AF, RA, HTN, HL. She is on Coumadin for anticoagulation.   Cardiac cath in 2012 showed no significant CAD.  She was admitted in 4/17 with chest pain. Echo demonstrated normal LVEF with mod pulmonary HTN (PASP 48 mmHg). CEs remained neg. No further ischemic workup was pursued. She was then readmitted 5/17 with HCAP with assoc moderate pleural effusion. She underwent left-sided thoracentesis. Effusion was felt to be exudative and concerning for infectious-empyema. Cytology was negative for malignancy.   She had a pericardial effusion in 11/17. THis was drained and was bloody.   She is intolerant of diltiazem.  In the past, she has had persistent DOE and occasional palpitations, worse if she walks and more with emotional stress.  She accidentally took too much verapamil. She was hospitalized for 4 days.  Denies : Chest pain. Dizziness. Leg edema. Nitroglycerin use. Orthopnea. Palpitations. Paroxysmal nocturnal dyspnea. Shortness of breath. Syncope.     Past Medical History:  Diagnosis Date  . Acne rosacea   . Adhesive capsulitis of left shoulder 1980  . Allergic rhinitis   . Chronic atrial fibrillation    a. Coumadin anticoagulation - [facility] ([place])  . Chronic bronchitis ([place])   . Esophageal reflux   . Frequent epistaxis    "cause of my coumadin" (12/16/2015)  . Heart murmur   . History of echocardiogram    a. Echo 4/17:  EF 55-60%, no RWMA, mild MR, mild LAE, PASP 48 mmHg, trivial effusion post to heart  . Hypercholesterolemia   . Hyperlipidemia     . Hypertension   . Osteopenia   . Pleural effusion associated with pulmonary infection    Admx 5/17 >> required L thoracentesis (cytology neg for malignancy)   . Pneumonia    "I've had it 4 times this year" (12/16/2015)  . Polymorphic light eruption 2004  . Prolonged QT interval   . Pulmonary HTN ([place])   . Respiratory failure ([place]) 04/2015   "spent 3 days; went home; then another 13 days in hospital" (12/16/2015  . Rheumatoid arthritis ([place]) 1975  . Right middle ear infection 1986  . Skin cancer    "burndt it off at the right side of my nose/face" (12/16/2015)    Past Surgical History:  Procedure Laterality Date  . BREAST BIOPSY Left 2000s X 2  . CARDIAC CATHETERIZATION  03/2010   a. Myoview 2/09: no ischemia; low risk  //  b. [place] 3/12: normal coronary arteries  . CARDIAC CATHETERIZATION N/A 12/16/2015   Procedure: Pericardiocentesis;  Surgeon: Nelva Bush, MD;  Location: [facility];  Service: Cardiovascular;  Laterality: N/A;  . CARDIOVERSION  04/2010  . TEMPOROMANDIBULAR JOINT SURGERY Bilateral   . TOTAL ABDOMINAL HYSTERECTOMY       Current Outpatient Medications  Medication Sig Dispense Refill  . acetaminophen (TYLENOL) 500 MG tablet Take 1,000 mg by mouth every 6 (six) hours as needed for moderate pain.    Marland Kitchen acidophilus (RISAQUAD) CAPS capsule Take 1 capsule  by mouth daily.    Marland Kitchen albuterol (PROVENTIL) (2.5 MG/3ML) 0.083% nebulizer solution Take 3 mLs (2.5 mg total) by nebulization every 6 (six) hours as needed for wheezing or shortness of breath. 75 mL 0  . Ascorbic Acid (VITAMIN C) 1000 MG tablet Take 1,000 mg by mouth daily.    . cholecalciferol (VITAMIN D) 1000 UNITS tablet Take 2,000 Units by mouth daily.    Marland Kitchen etanercept (ENBREL) 50 MG/ML injection Inject 50 mg as directed once a week. Friday    . fluticasone (FLONASE) 50 MCG/ACT nasal spray Place 2 sprays into both nostrils daily as needed for rhinitis.     . folic acid (FOLVITE) 1 MG tablet Take 2 mg by  mouth daily.     . furosemide (LASIX) 40 MG tablet TAKE 1 TABLET BY MOUTH EVERY DAY OR AS DIRECTED (Patient taking differently: Take 40 mg by mouth daily. ) 90 tablet 3  . MAGNESIUM OXIDE PO Take 1 tablet by mouth daily. With Zinc and Calcium    . methotrexate (RHEUMATREX) 2.5 MG tablet Take 2.5 mg by mouth 2 (two) times a week. Pt takes 2.5 mg tablets every Tuesday and Wednesday  1  . metoprolol succinate (TOPROL-XL) 50 MG 24 hr tablet Take 1 tablet (50 mg total) by mouth 2 (two) times daily with a meal. Take with or immediately following a meal. 60 tablet 0  . Multiple Vitamins-Minerals (MULTIVITAMIN WITH MINERALS) tablet Take 1 tablet by mouth daily.    . potassium chloride (K-DUR,KLOR-CON) 10 MEQ tablet Take 2 tablets (20 mEq total) by mouth daily. 60 tablet 1  . pravastatin (PRAVACHOL) 20 MG tablet Take 20 mg by mouth at bedtime.    . predniSONE (DELTASONE) 2.5 MG tablet Take 2.5 mg by mouth daily with breakfast.     . traMADol (ULTRAM) 50 MG tablet Take 50 mg by mouth as needed for pain.  0  . verapamil (CALAN) 120 MG tablet Take 1 tablet (120 mg total) by mouth daily. 60 tablet 0  . warfarin (COUMADIN) 5 MG tablet TAKE 1 TABLET BY MOUTH DAILY OR AS DIRECTED 100 tablet 1   No current facility-administered medications for this visit.     Allergies:   Arthrotec [diclofenac-misoprostol]; Erythromycin; Morphine sulfate; Amoxicillin; Ciprofloxacin; Diltiazem hcl; Gold-containing drug products; Macrodantin [nitrofurantoin macrocrystal]; Morphine; Naproxen; and Penicillins    Social History:  The patient  reports that she has never smoked. She has never used smokeless tobacco. She reports that she does not drink alcohol or use drugs.   Family History:  The patient's family history includes Healthy in her sister; Heart disease in her father, mother, sister, and sister; Other in her mother.    ROS:  Please see the history of present illness.   Otherwise, review of systems are positive for  dizziness-worse after medicine.   All other systems are reviewed and negative.    PHYSICAL EXAM: VS:  BP 110/72   Pulse 72   Ht 5\' 3"  (1.6 m)   Wt 154 lb (69.9 kg)   SpO2 98%   BMI 27.28 kg/m  , BMI Body mass index is 27.28 kg/m. GEN: Well nourished, well developed, in no acute distress  HEENT: normal  Neck: no JVD, carotid bruits, or masses Cardiac:irregularly irregular; no murmurs, rubs, or gallops,no edema  Respiratory:  clear to auscultation bilaterally, normal work of breathing GI: soft, nontender, nondistended, + BS MS: no deformity or atrophy  Skin: warm and dry, no rash Neuro:  Strength and sensation are  intact Psych: euthymic mood, full affect   Recent Labs: 12/25/2017: B Natriuretic Peptide 249.0 12/27/2017: ALT 30 12/30/2017: BUN 12; Creatinine, Ser 0.85; Hemoglobin 14.3; Magnesium 2.4; Platelets 170; Potassium 4.3; Sodium 142   Lipid Panel    Component Value Date/Time   CHOL 134 05/24/2015 1344   TRIG 148.0 05/21/2013 0846   HDL 36.60 (L) 05/21/2013 0846   CHOLHDL 4 05/21/2013 0846   VLDL 29.6 05/21/2013 0846   LDLCALC 79 05/21/2013 0846     Other studies Reviewed: Additional studies/ records that were reviewed today with results demonstrating: 2017- pericardial effusion drained.   ASSESSMENT AND PLAN:  1. AFib: Coumadin for stroke prevention.  Rate control strategy.  Intolerant of diltiazem.  Now controlled on verapamil.  2. HTN: The current medical regimen is effective;  continue present plan and medications. 3. DOE: Stable.  Try to increase activity level to help increase stamina. 4. Anticoagulated: Tolerating blood thinners.  No bleeding issues. 5. Dr. Trudie Reed gave her a 50-month handicap parking permit.   Current medicines are reviewed at length with the patient today.  The patient concerns regarding her medicines were addressed.  The following changes have been made:  No change  Labs/ tests ordered today include:  No orders of the defined  types were placed in this encounter.   Recommend 150 minutes/week of aerobic exercise Low fat, low carb, high fiber diet recommended  Disposition:   FU in 9 months   Signed, Larae Grooms, MD  01/28/2018 2:41 PM    [facility] [place], [place], [place]  [address] Phone: [phone]; Fax: [phone]

## 2018-02-11 ENCOUNTER — Ambulatory Visit (INDEPENDENT_AMBULATORY_CARE_PROVIDER_SITE_OTHER): Payer: Medicare Other

## 2018-02-11 DIAGNOSIS — Z5181 Encounter for therapeutic drug level monitoring: Secondary | ICD-10-CM

## 2018-02-11 DIAGNOSIS — Z7901 Long term (current) use of anticoagulants: Secondary | ICD-10-CM

## 2018-02-11 DIAGNOSIS — I4891 Unspecified atrial fibrillation: Secondary | ICD-10-CM | POA: Diagnosis not present

## 2018-02-11 LAB — POCT INR: INR: 2.2 (ref 2.0–3.0)

## 2018-02-11 NOTE — Patient Instructions (Signed)
Description   Continue on same dosage 1 tablet daily except 1/2 tablet on Mondays and Fridays.   Recheck in 4 weeks. Call us with any medication changes or bleeding concerns, [phone], Main [phone].

## 2018-03-11 ENCOUNTER — Ambulatory Visit (INDEPENDENT_AMBULATORY_CARE_PROVIDER_SITE_OTHER): Payer: Medicare Other | Admitting: Pharmacist

## 2018-03-11 DIAGNOSIS — Z7901 Long term (current) use of anticoagulants: Secondary | ICD-10-CM | POA: Diagnosis not present

## 2018-03-11 DIAGNOSIS — I4891 Unspecified atrial fibrillation: Secondary | ICD-10-CM | POA: Diagnosis not present

## 2018-03-11 DIAGNOSIS — Z5181 Encounter for therapeutic drug level monitoring: Secondary | ICD-10-CM

## 2018-03-11 LAB — POCT INR: INR: 1.9 — AB (ref 2.0–3.0)

## 2018-03-11 NOTE — Patient Instructions (Signed)
Description   Take 1 tablet today, then continue on same dosage 1 tablet daily except 1/2 tablet on Mondays and Fridays.   Recheck in 4 weeks. Call us with any medication changes or bleeding concerns, [phone], Main [phone].

## 2018-04-05 ENCOUNTER — Telehealth: Payer: Self-pay | Admitting: Pharmacist

## 2018-04-05 NOTE — Telephone Encounter (Signed)

## 2018-04-08 ENCOUNTER — Ambulatory Visit (INDEPENDENT_AMBULATORY_CARE_PROVIDER_SITE_OTHER): Payer: Medicare Other | Admitting: *Deleted

## 2018-04-08 ENCOUNTER — Other Ambulatory Visit: Payer: Self-pay

## 2018-04-08 DIAGNOSIS — Z7901 Long term (current) use of anticoagulants: Secondary | ICD-10-CM | POA: Diagnosis not present

## 2018-04-08 DIAGNOSIS — Z5181 Encounter for therapeutic drug level monitoring: Secondary | ICD-10-CM | POA: Diagnosis not present

## 2018-04-08 DIAGNOSIS — I4891 Unspecified atrial fibrillation: Secondary | ICD-10-CM | POA: Diagnosis not present

## 2018-04-08 LAB — POCT INR: INR: 1.8 — AB (ref 2.0–3.0)

## 2018-04-10 ENCOUNTER — Telehealth: Payer: Self-pay | Admitting: Pharmacist

## 2018-04-10 MED ORDER — APIXABAN 5 MG PO TABS: 5.0000 mg | ORAL_TABLET | Freq: Two times a day (BID) | ORAL | 5 refills | Status: DC

## 2018-04-10 NOTE — Telephone Encounter (Signed)
Sent in Eliquis rx - she has a $435 deductible to meet and wishes to stay on warfarin for now. She will look into alternative drug plans for next year to see if she can find a plan with lower deductible.

## 2018-04-26 ENCOUNTER — Telehealth: Payer: Self-pay

## 2018-04-26 NOTE — Telephone Encounter (Signed)

## 2018-04-29 ENCOUNTER — Ambulatory Visit (INDEPENDENT_AMBULATORY_CARE_PROVIDER_SITE_OTHER): Payer: Medicare Other | Admitting: *Deleted

## 2018-04-29 ENCOUNTER — Other Ambulatory Visit: Payer: Self-pay

## 2018-04-29 DIAGNOSIS — I4891 Unspecified atrial fibrillation: Secondary | ICD-10-CM | POA: Diagnosis not present

## 2018-04-29 DIAGNOSIS — Z7901 Long term (current) use of anticoagulants: Secondary | ICD-10-CM | POA: Diagnosis not present

## 2018-04-29 DIAGNOSIS — Z5181 Encounter for therapeutic drug level monitoring: Secondary | ICD-10-CM

## 2018-04-29 LAB — POCT INR: INR: 3 (ref 2.0–3.0)

## 2018-05-15 ENCOUNTER — Other Ambulatory Visit: Payer: Self-pay | Admitting: Interventional Cardiology

## 2018-05-17 ENCOUNTER — Telehealth: Payer: Self-pay

## 2018-05-17 NOTE — Telephone Encounter (Signed)

## 2018-05-20 ENCOUNTER — Ambulatory Visit (INDEPENDENT_AMBULATORY_CARE_PROVIDER_SITE_OTHER): Payer: Medicare Other | Admitting: *Deleted

## 2018-05-20 ENCOUNTER — Other Ambulatory Visit: Payer: Self-pay

## 2018-05-20 DIAGNOSIS — Z5181 Encounter for therapeutic drug level monitoring: Secondary | ICD-10-CM | POA: Diagnosis not present

## 2018-05-20 DIAGNOSIS — I4891 Unspecified atrial fibrillation: Secondary | ICD-10-CM

## 2018-05-20 DIAGNOSIS — Z7901 Long term (current) use of anticoagulants: Secondary | ICD-10-CM

## 2018-05-20 LAB — POCT INR: INR: 2.4 (ref 2.0–3.0)

## 2018-06-13 ENCOUNTER — Telehealth: Payer: Self-pay | Admitting: Pharmacist

## 2018-06-13 NOTE — Telephone Encounter (Signed)

## 2018-06-17 ENCOUNTER — Other Ambulatory Visit: Payer: Self-pay

## 2018-06-17 ENCOUNTER — Ambulatory Visit (INDEPENDENT_AMBULATORY_CARE_PROVIDER_SITE_OTHER): Payer: Medicare Other | Admitting: *Deleted

## 2018-06-17 DIAGNOSIS — Z7901 Long term (current) use of anticoagulants: Secondary | ICD-10-CM

## 2018-06-17 DIAGNOSIS — Z5181 Encounter for therapeutic drug level monitoring: Secondary | ICD-10-CM

## 2018-06-17 DIAGNOSIS — I4891 Unspecified atrial fibrillation: Secondary | ICD-10-CM | POA: Diagnosis not present

## 2018-06-17 LAB — POCT INR: INR: 3.2 — AB (ref 2.0–3.0)

## 2018-06-17 NOTE — Patient Instructions (Signed)
Description   Take half a tab tonight  and then continue taking 1 tablet daily except 1/2 tablet on Fridays.   Recheck in 3 weeks. Call us with any medication changes or bleeding concerns, [phone], Main [phone].

## 2018-07-01 ENCOUNTER — Telehealth: Payer: Self-pay

## 2018-07-01 NOTE — Telephone Encounter (Signed)

## 2018-07-04 ENCOUNTER — Other Ambulatory Visit: Payer: Self-pay | Admitting: *Deleted

## 2018-07-04 MED ORDER — WARFARIN SODIUM 5 MG PO TABS: ORAL_TABLET | ORAL | 1 refills | Status: DC

## 2018-07-16 ENCOUNTER — Other Ambulatory Visit: Payer: Self-pay

## 2018-07-16 ENCOUNTER — Other Ambulatory Visit: Payer: Medicare Other

## 2018-07-16 ENCOUNTER — Ambulatory Visit (INDEPENDENT_AMBULATORY_CARE_PROVIDER_SITE_OTHER): Payer: Medicare Other | Admitting: Pharmacist

## 2018-07-16 DIAGNOSIS — Z7901 Long term (current) use of anticoagulants: Secondary | ICD-10-CM | POA: Diagnosis not present

## 2018-07-16 DIAGNOSIS — I4891 Unspecified atrial fibrillation: Secondary | ICD-10-CM

## 2018-07-16 DIAGNOSIS — Z5181 Encounter for therapeutic drug level monitoring: Secondary | ICD-10-CM | POA: Diagnosis not present

## 2018-07-16 LAB — POCT INR: INR: 3.2 — AB (ref 2.0–3.0)

## 2018-07-16 NOTE — Patient Instructions (Signed)
Decrease dose to 1 tablet daily except 1/2 tablet on Tuesdays and Fridays.   Recheck in 3 weeks. Call us with any medication changes or bleeding concerns, [phone], Main [phone].

## 2018-08-05 ENCOUNTER — Telehealth: Payer: Self-pay

## 2018-08-05 NOTE — Telephone Encounter (Signed)

## 2018-08-06 ENCOUNTER — Ambulatory Visit (INDEPENDENT_AMBULATORY_CARE_PROVIDER_SITE_OTHER): Payer: Medicare Other

## 2018-08-06 ENCOUNTER — Other Ambulatory Visit: Payer: Self-pay

## 2018-08-06 DIAGNOSIS — I4891 Unspecified atrial fibrillation: Secondary | ICD-10-CM

## 2018-08-06 DIAGNOSIS — Z7901 Long term (current) use of anticoagulants: Secondary | ICD-10-CM | POA: Diagnosis not present

## 2018-08-06 DIAGNOSIS — Z5181 Encounter for therapeutic drug level monitoring: Secondary | ICD-10-CM

## 2018-08-06 LAB — POCT INR: INR: 1.7 — AB (ref 2.0–3.0)

## 2018-08-06 NOTE — Patient Instructions (Signed)
Description   Start taking 1 tablet daily except 1/2 tablet on Fridays.   Recheck in 3 weeks. Call us with any medication changes or bleeding concerns, [phone], Main [phone].

## 2018-08-28 ENCOUNTER — Ambulatory Visit (INDEPENDENT_AMBULATORY_CARE_PROVIDER_SITE_OTHER): Payer: Medicare Other

## 2018-08-28 ENCOUNTER — Other Ambulatory Visit: Payer: Self-pay

## 2018-08-28 DIAGNOSIS — Z5181 Encounter for therapeutic drug level monitoring: Secondary | ICD-10-CM

## 2018-08-28 DIAGNOSIS — Z7901 Long term (current) use of anticoagulants: Secondary | ICD-10-CM

## 2018-08-28 DIAGNOSIS — I4891 Unspecified atrial fibrillation: Secondary | ICD-10-CM | POA: Diagnosis not present

## 2018-08-28 LAB — POCT INR: INR: 1.8 — AB (ref 2.0–3.0)

## 2018-08-28 NOTE — Patient Instructions (Signed)
Description   Start taking 1 tablet daily.   Recheck in 3 weeks. Call us with any medication changes or bleeding concerns, [phone], Main [phone].

## 2018-09-18 ENCOUNTER — Other Ambulatory Visit: Payer: Self-pay

## 2018-09-18 ENCOUNTER — Ambulatory Visit (INDEPENDENT_AMBULATORY_CARE_PROVIDER_SITE_OTHER): Payer: Medicare Other | Admitting: Pharmacist

## 2018-09-18 DIAGNOSIS — Z5181 Encounter for therapeutic drug level monitoring: Secondary | ICD-10-CM | POA: Diagnosis not present

## 2018-09-18 DIAGNOSIS — Z7901 Long term (current) use of anticoagulants: Secondary | ICD-10-CM | POA: Diagnosis not present

## 2018-09-18 DIAGNOSIS — I4891 Unspecified atrial fibrillation: Secondary | ICD-10-CM

## 2018-09-18 LAB — POCT INR: INR: 2.7 (ref 2.0–3.0)

## 2018-09-18 NOTE — Patient Instructions (Signed)
Description   Continue taking 1 tablet daily.   Recheck in 4 weeks. Call us with any medication changes or bleeding concerns, [phone], Main [phone].

## 2018-10-16 ENCOUNTER — Ambulatory Visit (INDEPENDENT_AMBULATORY_CARE_PROVIDER_SITE_OTHER): Payer: Medicare Other

## 2018-10-16 ENCOUNTER — Other Ambulatory Visit: Payer: Self-pay

## 2018-10-16 DIAGNOSIS — Z5181 Encounter for therapeutic drug level monitoring: Secondary | ICD-10-CM

## 2018-10-16 DIAGNOSIS — I4891 Unspecified atrial fibrillation: Secondary | ICD-10-CM | POA: Diagnosis not present

## 2018-10-16 DIAGNOSIS — Z7901 Long term (current) use of anticoagulants: Secondary | ICD-10-CM

## 2018-10-16 LAB — POCT INR: INR: 2.7 (ref 2.0–3.0)

## 2018-10-16 NOTE — Patient Instructions (Signed)
Description   Continue on same dosage 1 tablet daily.   Recheck in 5 weeks. Call us with any medication changes or bleeding concerns, [phone], Main [phone].

## 2018-11-17 NOTE — Progress Notes (Signed)
Cardiology Office Note   Date:  11/18/2018   ID:  Nicole, [name] 12-01-1940, MRN [id number]  PCP:  Leeroy Cha, MD    No chief complaint on file.  AFib  Wt Readings from Last 3 Encounters:  11/18/18 155 lb (70.3 kg)  01/28/18 154 lb (69.9 kg)  12/26/17 158 lb 11.7 oz (72 kg)       History of Present Illness: [name] is a 78 y.o. female  with a hx of chronic AF, RA, HTN, HL. She is on Coumadin for anticoagulation.   Cardiac cath in 2012 showed no significant CAD.  She was admitted in 4/17 with chest pain. Echo demonstrated normal LVEF with mod pulmonary HTN (PASP 48 mmHg). CEs remained neg. No further ischemic workup was pursued. She was then readmitted 5/17 with HCAP with assoc moderate pleural effusion. She underwent left-sided thoracentesis. Effusion was felt to be exudative and concerning for infectious-empyema. Cytology was negative for malignancy.   She had a pericardial effusion in 11/17. THis was drained and was bloody.  She is intolerant of diltiazem.   Her nose was cauterized in June 2020.    In the past, she has had persistent DOE and occasional palpitations, worse if she walks and more with emotional stress.  She accidentally took too much verapamil. She was hospitalized for 4 days.  She has had several bouts of pneumonia.  Denies : Chest pain. Dizziness. Leg edema. Nitroglycerin use. Orthopnea. Palpitations. Paroxysmal nocturnal dyspnea. Syncope.   She walks a little.  She has some DOE.  No bleeding problems since her nose was cauterized in June 2020.     Past Medical History:  Diagnosis Date  . Acne rosacea   . Adhesive capsulitis of left shoulder 1980  . Allergic rhinitis   . [place])    a. Coumadin anticoagulation - [facility] ([place])  . Chronic bronchitis ([place])   . Esophageal reflux   . Frequent epistaxis    "cause of my coumadin" (12/16/2015)  . Heart murmur   .  History of echocardiogram    a. Echo 4/17:  EF 55-60%, no RWMA, mild MR, mild LAE, PASP 48 mmHg, trivial effusion post to heart  . Hypercholesterolemia   . Hyperlipidemia   . Hypertension   . Osteopenia   . Pleural effusion associated with pulmonary infection    Admx 5/17 >> required L thoracentesis (cytology neg for malignancy)   . Pneumonia    "I've had it 4 times this year" (12/16/2015)  . Polymorphic light eruption 2004  . Prolonged QT interval   . Pulmonary HTN ([place])   . Respiratory failure ([place]) 04/2015   "spent 3 days; went home; then another 13 days in hospital" (12/16/2015  . Rheumatoid arthritis ([place]) 1975  . Right middle ear infection 1986  . Skin cancer    "burndt it off at the right side of my nose/face" (12/16/2015)    Past Surgical History:  Procedure Laterality Date  . BREAST BIOPSY Left 2000s X 2  . CARDIAC CATHETERIZATION  03/2010   a. Myoview 2/09: no ischemia; low risk  //  b. [place] 3/12: normal coronary arteries  . CARDIAC CATHETERIZATION N/A 12/16/2015   Procedure: Pericardiocentesis;  Surgeon: Nelva Bush, MD;  Location: [facility];  Service: Cardiovascular;  Laterality: N/A;  . CARDIOVERSION  04/2010  . TEMPOROMANDIBULAR JOINT SURGERY Bilateral   . TOTAL ABDOMINAL HYSTERECTOMY       Current Outpatient Medications  Medication Sig Dispense Refill  . acetaminophen (TYLENOL) 500 MG tablet Take 1,000 mg by mouth every 6 (six) hours as needed for moderate pain.    Marland Kitchen acidophilus (RISAQUAD) CAPS capsule Take 1 capsule by mouth daily.    Marland Kitchen albuterol (PROVENTIL) (2.5 MG/3ML) 0.083% nebulizer solution Take 3 mLs (2.5 mg total) by nebulization every 6 (six) hours as needed for wheezing or shortness of breath. 75 mL 0  . apixaban (ELIQUIS) 5 MG TABS tablet Take 1 tablet (5 mg total) by mouth 2 (two) times daily. 60 tablet 5  . Ascorbic Acid (VITAMIN C) 1000 MG tablet Take 1,000 mg by mouth daily.    . cholecalciferol (VITAMIN D) 1000 UNITS tablet Take  2,000 Units by mouth daily.    Marland Kitchen etanercept (ENBREL) 50 MG/ML injection Inject 50 mg as directed once a week. Friday    . fluticasone (FLONASE) 50 MCG/ACT nasal spray Place 2 sprays into both nostrils daily as needed for rhinitis.     . folic acid (FOLVITE) 1 MG tablet Take 2 mg by mouth daily.     . furosemide (LASIX) 40 MG tablet TAKE 1 TABLET BY MOUTH EVERY DAY OR AS DIRECTED 90 tablet 2  . MAGNESIUM OXIDE PO Take 1 tablet by mouth daily. With Zinc and Calcium    . methotrexate (RHEUMATREX) 2.5 MG tablet Take 2.5 mg by mouth 2 (two) times a week. Pt takes 2.5 mg tablets every Tuesday and Wednesday  1  . Multiple Vitamins-Minerals (MULTIVITAMIN WITH MINERALS) tablet Take 1 tablet by mouth daily.    . potassium chloride (K-DUR,KLOR-CON) 10 MEQ tablet Take 2 tablets (20 mEq total) by mouth daily. 60 tablet 1  . pravastatin (PRAVACHOL) 20 MG tablet Take 20 mg by mouth at bedtime.    . predniSONE (DELTASONE) 2.5 MG tablet Take 2.5 mg by mouth daily with breakfast.     . traMADol (ULTRAM) 50 MG tablet Take 50 mg by mouth as needed for pain.  0  . verapamil (CALAN) 120 MG tablet Take 1 tablet (120 mg total) by mouth daily. 90 tablet 3  . warfarin (COUMADIN) 5 MG tablet TAKE 1 TABLET BY MOUTH DAILY OR AS DIRECTED 100 tablet 1  . metoprolol succinate (TOPROL-XL) 50 MG 24 hr tablet Take 1 tablet (50 mg total) by mouth 2 (two) times daily with a meal. Take with or immediately following a meal. 60 tablet 0   No current facility-administered medications for this visit.     Allergies:   Arthrotec [diclofenac-misoprostol], Erythromycin, Morphine sulfate, Amoxicillin, Ciprofloxacin, Diltiazem hcl, Gold-containing drug products, Macrodantin [nitrofurantoin macrocrystal], Morphine, Naproxen, and Penicillins    Social History:  The patient  reports that she has never smoked. She has never used smokeless tobacco. She reports that she does not drink alcohol or use drugs.   Family History:  The patient's family  history includes Healthy in her sister; Heart disease in her father, mother, sister, and sister; Other in her mother.    ROS:  Please see the history of present illness.   Otherwise, review of systems are positive for occasional fatigue.   All other systems are reviewed and negative.    PHYSICAL EXAM: VS:  BP 102/60   Pulse 67   Ht 5\' 3"  (1.6 m)   Wt 155 lb (70.3 kg)   SpO2 98%   BMI 27.46 kg/m  , BMI Body mass index is 27.46 kg/m. GEN: Well nourished, well developed, in no acute distress  HEENT: normal  Neck: no  JVD, carotid bruits, or masses Cardiac: irregularly irregular; no murmurs, rubs, or gallops,no edema  Respiratory:  clear to auscultation bilaterally, normal work of breathing GI: soft, nontender, nondistended, + BS MS: no deformity or atrophy  Skin: warm and dry, no rash Neuro:  Strength and sensation are intact Psych: euthymic mood, full affect   EKG:   The ekg ordered today demonstrates AFib, rate controlled, nonspecific ST-T wave changes   Recent Labs: 12/25/2017: B Natriuretic Peptide 249.0 12/27/2017: ALT 30 12/30/2017: BUN 12; Creatinine, Ser 0.85; Hemoglobin 14.3; Magnesium 2.4; Platelets 170; Potassium 4.3; Sodium 142   Lipid Panel    Component Value Date/Time   CHOL 134 05/24/2015 1344   TRIG 148.0 05/21/2013 0846   HDL 36.60 (L) 05/21/2013 0846   CHOLHDL 4 05/21/2013 0846   VLDL 29.6 05/21/2013 0846   LDLCALC 79 05/21/2013 0846     Other studies Reviewed: Additional studies/ records that were reviewed today with results demonstrating: labs reviewed. LDL 70   ASSESSMENT AND PLAN:  1. AFib: Rate controlled.  COntinue current meds.  COumadin for stroke prevention.  2. Anticoagulated: No bleeding issues.  3. HTN: The current medical regimen is effective;  continue present plan and medications. 4. DOE: stable.  No fluid overload.  5. Has had elevated LFTs with MTX for arthritis.   Current medicines are reviewed at length with the patient  today.  The patient concerns regarding her medicines were addressed.  The following changes have been made:  No change  Labs/ tests ordered today include:  No orders of the defined types were placed in this encounter.   Recommend 150 minutes/week of aerobic exercise Low fat, low carb, high fiber diet recommended  Disposition:   FU in 1 year   Signed, Larae Grooms, MD  11/18/2018 4:38 PM    [facility] [place], [place], [place]  [address] Phone: [phone]; Fax: [phone]

## 2018-11-18 ENCOUNTER — Ambulatory Visit (INDEPENDENT_AMBULATORY_CARE_PROVIDER_SITE_OTHER): Payer: Medicare Other | Admitting: *Deleted

## 2018-11-18 ENCOUNTER — Other Ambulatory Visit: Payer: Self-pay

## 2018-11-18 ENCOUNTER — Encounter: Payer: Self-pay | Admitting: Interventional Cardiology

## 2018-11-18 ENCOUNTER — Ambulatory Visit (INDEPENDENT_AMBULATORY_CARE_PROVIDER_SITE_OTHER): Payer: Medicare Other | Admitting: Interventional Cardiology

## 2018-11-18 VITALS — BP 102/60 | HR 67 | Ht 63.0 in | Wt 155.0 lb

## 2018-11-18 DIAGNOSIS — I4891 Unspecified atrial fibrillation: Secondary | ICD-10-CM

## 2018-11-18 DIAGNOSIS — I4821 Permanent atrial fibrillation: Secondary | ICD-10-CM | POA: Diagnosis not present

## 2018-11-18 DIAGNOSIS — Z5181 Encounter for therapeutic drug level monitoring: Secondary | ICD-10-CM

## 2018-11-18 DIAGNOSIS — I10 Essential (primary) hypertension: Secondary | ICD-10-CM

## 2018-11-18 DIAGNOSIS — Z7901 Long term (current) use of anticoagulants: Secondary | ICD-10-CM

## 2018-11-18 DIAGNOSIS — R0600 Dyspnea, unspecified: Secondary | ICD-10-CM | POA: Diagnosis not present

## 2018-11-18 DIAGNOSIS — R0609 Other forms of dyspnea: Secondary | ICD-10-CM

## 2018-11-18 LAB — POCT INR: INR: 3.1 — AB (ref 2.0–3.0)

## 2018-11-18 NOTE — Patient Instructions (Signed)

## 2018-11-18 NOTE — Patient Instructions (Signed)
Description   Today take 1/2 tablet then continue on same dosage 1 tablet daily.   Recheck in 5 weeks. Call us with any medication changes or bleeding concerns, [phone], Main [phone].

## 2018-12-23 ENCOUNTER — Ambulatory Visit (INDEPENDENT_AMBULATORY_CARE_PROVIDER_SITE_OTHER): Payer: Medicare Other | Admitting: *Deleted

## 2018-12-23 ENCOUNTER — Other Ambulatory Visit: Payer: Self-pay

## 2018-12-23 DIAGNOSIS — Z5181 Encounter for therapeutic drug level monitoring: Secondary | ICD-10-CM

## 2018-12-23 DIAGNOSIS — Z7901 Long term (current) use of anticoagulants: Secondary | ICD-10-CM

## 2018-12-23 DIAGNOSIS — I4891 Unspecified atrial fibrillation: Secondary | ICD-10-CM | POA: Diagnosis not present

## 2018-12-23 LAB — POCT INR: INR: 3.8 — AB (ref 2.0–3.0)

## 2018-12-23 NOTE — Patient Instructions (Signed)
Description   Hold today, then continue on same dosage 1 tablet daily. Recheck in 3 weeks. Call us with any medication changes or bleeding concerns, [phone], Main [phone].

## 2019-01-13 ENCOUNTER — Other Ambulatory Visit: Payer: Self-pay

## 2019-01-13 ENCOUNTER — Ambulatory Visit (INDEPENDENT_AMBULATORY_CARE_PROVIDER_SITE_OTHER): Payer: Medicare Other | Admitting: *Deleted

## 2019-01-13 DIAGNOSIS — Z7901 Long term (current) use of anticoagulants: Secondary | ICD-10-CM

## 2019-01-13 DIAGNOSIS — I4891 Unspecified atrial fibrillation: Secondary | ICD-10-CM

## 2019-01-13 DIAGNOSIS — Z5181 Encounter for therapeutic drug level monitoring: Secondary | ICD-10-CM

## 2019-01-13 LAB — POCT INR: INR: 4.6 — AB (ref 2.0–3.0)

## 2019-01-13 NOTE — Patient Instructions (Signed)
Description   Hold today and tomorrow,  then start taking 1 tablet daily except for 1/2 a tablet on Mondays. Recheck in 2 weeks. Call us with any medication changes or bleeding concerns, [phone], Main [phone].

## 2019-01-20 ENCOUNTER — Other Ambulatory Visit: Payer: Self-pay | Admitting: *Deleted

## 2019-01-20 MED ORDER — WARFARIN SODIUM 5 MG PO TABS: ORAL_TABLET | ORAL | 0 refills | Status: DC

## 2019-01-27 ENCOUNTER — Ambulatory Visit (INDEPENDENT_AMBULATORY_CARE_PROVIDER_SITE_OTHER): Payer: Medicare Other | Admitting: *Deleted

## 2019-01-27 ENCOUNTER — Other Ambulatory Visit: Payer: Self-pay

## 2019-01-27 DIAGNOSIS — Z7901 Long term (current) use of anticoagulants: Secondary | ICD-10-CM

## 2019-01-27 DIAGNOSIS — I4891 Unspecified atrial fibrillation: Secondary | ICD-10-CM | POA: Diagnosis not present

## 2019-01-27 DIAGNOSIS — Z5181 Encounter for therapeutic drug level monitoring: Secondary | ICD-10-CM | POA: Diagnosis not present

## 2019-01-27 LAB — POCT INR: INR: 4.3 — AB (ref 2.0–3.0)

## 2019-01-27 NOTE — Patient Instructions (Addendum)
Description   Hold today and hold tomorrow's dose then start taking 1 tablet daily except for 1/2 tablet on Mondays and Fridays. Recheck in 2 weeks. Call us with any medication changes or bleeding concerns, [phone], Main [phone].

## 2019-02-10 ENCOUNTER — Ambulatory Visit (INDEPENDENT_AMBULATORY_CARE_PROVIDER_SITE_OTHER): Payer: Medicare Other | Admitting: *Deleted

## 2019-02-10 ENCOUNTER — Other Ambulatory Visit: Payer: Self-pay

## 2019-02-10 DIAGNOSIS — Z7901 Long term (current) use of anticoagulants: Secondary | ICD-10-CM

## 2019-02-10 DIAGNOSIS — Z5181 Encounter for therapeutic drug level monitoring: Secondary | ICD-10-CM | POA: Diagnosis not present

## 2019-02-10 DIAGNOSIS — I4891 Unspecified atrial fibrillation: Secondary | ICD-10-CM | POA: Diagnosis not present

## 2019-02-10 LAB — POCT INR: INR: 1.9 — AB (ref 2.0–3.0)

## 2019-02-10 NOTE — Patient Instructions (Signed)
Description   Take 1 tablet today and then continue taking 1 tablet daily except for 1/2 tablet on Mondays and Fridays. Recheck in 2 weeks. Call us with any medication changes or bleeding concerns, [phone], Main [phone].

## 2019-02-24 ENCOUNTER — Ambulatory Visit (INDEPENDENT_AMBULATORY_CARE_PROVIDER_SITE_OTHER): Payer: Medicare Other | Admitting: *Deleted

## 2019-02-24 ENCOUNTER — Other Ambulatory Visit: Payer: Self-pay

## 2019-02-24 DIAGNOSIS — Z5181 Encounter for therapeutic drug level monitoring: Secondary | ICD-10-CM | POA: Diagnosis not present

## 2019-02-24 DIAGNOSIS — Z7901 Long term (current) use of anticoagulants: Secondary | ICD-10-CM | POA: Diagnosis not present

## 2019-02-24 DIAGNOSIS — I4891 Unspecified atrial fibrillation: Secondary | ICD-10-CM | POA: Diagnosis not present

## 2019-02-24 LAB — POCT INR: INR: 2.8 (ref 2.0–3.0)

## 2019-02-24 NOTE — Patient Instructions (Signed)
Description   Continue taking 1 tablet daily except for 1/2 tablet on Mondays and Fridays. Recheck in 3 weeks. Call us with any medication changes or bleeding concerns, [phone], Main [phone].

## 2019-03-17 ENCOUNTER — Other Ambulatory Visit: Payer: Self-pay

## 2019-03-17 ENCOUNTER — Ambulatory Visit (INDEPENDENT_AMBULATORY_CARE_PROVIDER_SITE_OTHER): Payer: Medicare Other | Admitting: *Deleted

## 2019-03-17 ENCOUNTER — Encounter (INDEPENDENT_AMBULATORY_CARE_PROVIDER_SITE_OTHER): Payer: Self-pay

## 2019-03-17 DIAGNOSIS — Z5181 Encounter for therapeutic drug level monitoring: Secondary | ICD-10-CM

## 2019-03-17 DIAGNOSIS — I4891 Unspecified atrial fibrillation: Secondary | ICD-10-CM

## 2019-03-17 DIAGNOSIS — Z7901 Long term (current) use of anticoagulants: Secondary | ICD-10-CM | POA: Diagnosis not present

## 2019-03-17 LAB — POCT INR: INR: 2.7 (ref 2.0–3.0)

## 2019-03-17 MED ORDER — FUROSEMIDE 40 MG PO TABS: ORAL_TABLET | ORAL | 2 refills | Status: DC

## 2019-03-17 NOTE — Patient Instructions (Signed)
Description   Continue taking 1 tablet daily except for 1/2 tablet on Mondays and Fridays. Recheck in 4 weeks. Call us with any medication changes or bleeding concerns, [phone], Main [phone].

## 2019-04-14 ENCOUNTER — Other Ambulatory Visit: Payer: Self-pay

## 2019-04-14 ENCOUNTER — Ambulatory Visit (INDEPENDENT_AMBULATORY_CARE_PROVIDER_SITE_OTHER): Payer: Medicare Other | Admitting: *Deleted

## 2019-04-14 DIAGNOSIS — Z5181 Encounter for therapeutic drug level monitoring: Secondary | ICD-10-CM

## 2019-04-14 DIAGNOSIS — Z7901 Long term (current) use of anticoagulants: Secondary | ICD-10-CM | POA: Diagnosis not present

## 2019-04-14 DIAGNOSIS — I4891 Unspecified atrial fibrillation: Secondary | ICD-10-CM | POA: Diagnosis not present

## 2019-04-14 LAB — POCT INR: INR: 2 (ref 2.0–3.0)

## 2019-04-14 NOTE — Patient Instructions (Signed)
Description   Continue taking 1 tablet daily except for 1/2 tablet on Mondays and Fridays. Recheck in 5 weeks. Call us with any medication changes or bleeding concerns, [phone], Main [phone].

## 2019-04-16 ENCOUNTER — Other Ambulatory Visit: Payer: Self-pay | Admitting: Pharmacist

## 2019-04-16 MED ORDER — WARFARIN SODIUM 5 MG PO TABS: ORAL_TABLET | ORAL | 0 refills | Status: DC

## 2019-05-22 ENCOUNTER — Other Ambulatory Visit: Payer: Self-pay

## 2019-05-22 ENCOUNTER — Ambulatory Visit (INDEPENDENT_AMBULATORY_CARE_PROVIDER_SITE_OTHER): Payer: Medicare Other | Admitting: *Deleted

## 2019-05-22 ENCOUNTER — Encounter (INDEPENDENT_AMBULATORY_CARE_PROVIDER_SITE_OTHER): Payer: Self-pay

## 2019-05-22 DIAGNOSIS — I4891 Unspecified atrial fibrillation: Secondary | ICD-10-CM | POA: Diagnosis not present

## 2019-05-22 DIAGNOSIS — Z7901 Long term (current) use of anticoagulants: Secondary | ICD-10-CM | POA: Diagnosis not present

## 2019-05-22 DIAGNOSIS — Z5181 Encounter for therapeutic drug level monitoring: Secondary | ICD-10-CM

## 2019-05-22 LAB — POCT INR: INR: 2.8 (ref 2.0–3.0)

## 2019-07-02 ENCOUNTER — Other Ambulatory Visit: Payer: Self-pay | Admitting: *Deleted

## 2019-07-02 MED ORDER — WARFARIN SODIUM 5 MG PO TABS: ORAL_TABLET | ORAL | 0 refills | Status: DC

## 2019-07-03 ENCOUNTER — Other Ambulatory Visit: Payer: Self-pay

## 2019-07-03 ENCOUNTER — Ambulatory Visit (INDEPENDENT_AMBULATORY_CARE_PROVIDER_SITE_OTHER): Payer: Medicare Other | Admitting: *Deleted

## 2019-07-03 DIAGNOSIS — Z5181 Encounter for therapeutic drug level monitoring: Secondary | ICD-10-CM | POA: Diagnosis not present

## 2019-07-03 DIAGNOSIS — I4891 Unspecified atrial fibrillation: Secondary | ICD-10-CM

## 2019-07-03 DIAGNOSIS — Z7901 Long term (current) use of anticoagulants: Secondary | ICD-10-CM | POA: Diagnosis not present

## 2019-07-03 LAB — POCT INR: INR: 2.8 (ref 2.0–3.0)

## 2019-07-03 NOTE — Patient Instructions (Signed)
Description   Continue taking Warfarin 1 tablet daily except for 1/2 tablet on Mondays and Fridays. Recheck in 6 weeks. Call us with any medication changes or bleeding concerns, [phone], Main [phone].

## 2019-08-15 ENCOUNTER — Ambulatory Visit (INDEPENDENT_AMBULATORY_CARE_PROVIDER_SITE_OTHER): Payer: Medicare Other

## 2019-08-15 ENCOUNTER — Other Ambulatory Visit: Payer: Self-pay

## 2019-08-15 DIAGNOSIS — Z7901 Long term (current) use of anticoagulants: Secondary | ICD-10-CM

## 2019-08-15 DIAGNOSIS — I4891 Unspecified atrial fibrillation: Secondary | ICD-10-CM

## 2019-08-15 DIAGNOSIS — Z5181 Encounter for therapeutic drug level monitoring: Secondary | ICD-10-CM

## 2019-08-15 LAB — POCT INR: INR: 2.3 (ref 2.0–3.0)

## 2019-08-15 NOTE — Patient Instructions (Signed)
Continue taking Warfarin 1 tablet daily except for 1/2 tablet on Mondays and Fridays. Recheck in 6 weeks. Call us with any medication changes or bleeding concerns, [phone], Main [phone].

## 2019-09-12 ENCOUNTER — Telehealth: Payer: Self-pay | Admitting: Interventional Cardiology

## 2019-09-12 NOTE — Telephone Encounter (Signed)
I s/w Dr. Merry Proud office [facility] and informed them pt called about hr cataract surgery coming up. Dr. Merry Proud office will fax over clearance form to our office [phone]. Once I receive clearance form I will present to our Pre Op Team.

## 2019-09-12 NOTE — Telephone Encounter (Signed)
   Primary Cardiologist: Larae Grooms, MD  Chart reviewed as part of pre-operative protocol coverage. Given past medical history and time since last visit, based on ACC/AHA guidelines, QUIANNA AVERY would be at acceptable risk for the planned procedure without further cardiovascular testing.  Cataract surgery low risk procedure from cardiology standpoint.   I will route this recommendation to the requesting party via Epic fax function and remove from pre-op pool.  Please call with questions.  Phill Myron. West Pugh, ANP, AACC  09/12/2019, 2:09 PM

## 2019-09-12 NOTE — Telephone Encounter (Signed)
I s/w the pt and first asked who was doing her eye surgery. Pt answered  Dr, Talbert Forest with [facility]. Pt has a question about her upcoming CVRR appt and wanted to know if this needs to be moved. I assured the pt that I will place that note in my clearance notes. Pt states she is having cataract surgery 9/3 and 10/03/19. I tried to call Dr. Merry Proud office to obtain clearance information needed, though office is closed for lunch. I will try again later.

## 2019-09-12 NOTE — Telephone Encounter (Signed)
Patient called and states she is having eye surgery on 09/19/19 and 10/03/19. She wanted to know if she needed to change her coumadin appointment that is in between those dates. Please advise

## 2019-09-12 NOTE — Telephone Encounter (Signed)
   [facility] Pre-operative Risk Assessment    HEARTCARE STAFF: - Please ensure there is not already an duplicate clearance open for this procedure. - Under Visit Info/Reason for Call, type in Other and utilize the format Clearance MM/DD/YY or Clearance TBD. Do not use dashes or single digits. - If request is for dental extraction, please clarify the # of teeth to be extracted.  Request for surgical clearance:  1. What type of surgery is being performed? CATARACT EXTRACTION w/INTRAOCULAR LENS IMPLANT OF THE LEFT OR RIGHT EYE 09/19/19 AND THE LEFT OR RIGHT EYE TO BE DONE ON 9/217/21   2. When is this surgery scheduled? 09/19/19 AND 10/03/19   3. What type of clearance is required (medical clearance vs. Pharmacy clearance to hold med vs. Both)? MEDICAL  4. Are there any medications that need to be held prior to surgery and how long? PER DR. BEVIS PT WILL NOT NEED TO HOLD ANY MEDICATIONS INCLUDING BLOOD THINNERS  5. Practice name and name of physician performing surgery? [facility]; DR. Talbert Forest   6. What is the office phone number? [phone]   7.   What is the office fax number? [phone]  8.   Anesthesia type (None, local, MAC, general) ? TOPICAL WITH IV MEDICATIONS   Julaine Hua 09/12/2019, 1:50 PM  _________________________________________________________________   (provider comments below)

## 2019-09-20 ENCOUNTER — Other Ambulatory Visit: Payer: Self-pay | Admitting: Interventional Cardiology

## 2019-09-26 ENCOUNTER — Other Ambulatory Visit: Payer: Self-pay

## 2019-09-26 ENCOUNTER — Ambulatory Visit (INDEPENDENT_AMBULATORY_CARE_PROVIDER_SITE_OTHER): Payer: Medicare Other

## 2019-09-26 DIAGNOSIS — Z7901 Long term (current) use of anticoagulants: Secondary | ICD-10-CM

## 2019-09-26 DIAGNOSIS — Z5181 Encounter for therapeutic drug level monitoring: Secondary | ICD-10-CM

## 2019-09-26 DIAGNOSIS — I4891 Unspecified atrial fibrillation: Secondary | ICD-10-CM | POA: Diagnosis not present

## 2019-09-26 LAB — POCT INR: INR: 3.2 — AB (ref 2.0–3.0)

## 2019-09-26 NOTE — Patient Instructions (Signed)
Description   Skip today's dosage of Warfarin, then resume same dosage 1 tablet daily except for 1/2 tablet on Mondays and Fridays. Recheck in 3 weeks. Call us with any medication changes or bleeding concerns, [phone], Main [phone].

## 2019-10-17 ENCOUNTER — Ambulatory Visit (INDEPENDENT_AMBULATORY_CARE_PROVIDER_SITE_OTHER): Payer: Medicare Other | Admitting: *Deleted

## 2019-10-17 ENCOUNTER — Other Ambulatory Visit: Payer: Self-pay

## 2019-10-17 DIAGNOSIS — I4891 Unspecified atrial fibrillation: Secondary | ICD-10-CM

## 2019-10-17 DIAGNOSIS — Z5181 Encounter for therapeutic drug level monitoring: Secondary | ICD-10-CM | POA: Diagnosis not present

## 2019-10-17 DIAGNOSIS — Z7901 Long term (current) use of anticoagulants: Secondary | ICD-10-CM

## 2019-10-17 LAB — POCT INR: INR: 3.8 — AB (ref 2.0–3.0)

## 2019-10-17 NOTE — Patient Instructions (Signed)
Description   Hold warfarin today and then start taking 1 tablet daily except for 1/2 a tablet on Monday, Wednesday and Friday. Recheck INR in 3 weeks.  Call us with any medication changes or bleeding concerns, [phone], Main [phone].

## 2019-11-07 ENCOUNTER — Other Ambulatory Visit: Payer: Self-pay

## 2019-11-07 ENCOUNTER — Ambulatory Visit (INDEPENDENT_AMBULATORY_CARE_PROVIDER_SITE_OTHER): Payer: Medicare Other | Admitting: Pharmacist

## 2019-11-07 DIAGNOSIS — Z5181 Encounter for therapeutic drug level monitoring: Secondary | ICD-10-CM | POA: Diagnosis not present

## 2019-11-07 DIAGNOSIS — Z7901 Long term (current) use of anticoagulants: Secondary | ICD-10-CM

## 2019-11-07 DIAGNOSIS — I4891 Unspecified atrial fibrillation: Secondary | ICD-10-CM

## 2019-11-07 LAB — POCT INR: INR: 3.4 — AB (ref 2.0–3.0)

## 2019-11-07 NOTE — Patient Instructions (Addendum)
Hold warfarin today and then start taking 1/2 tablet daily except for 1 tablet on Sun, Tues, Thurs. Recheck INR in 2 weeks.  Call us with any medication changes or bleeding concerns, [phone], Main [phone].

## 2019-11-21 NOTE — Progress Notes (Signed)
Cardiology Office Note   Date:  11/24/2019   ID:  [name], DOB June 17, 1940, MRN [id number]  PCP:  Leeroy Cha, MD    No chief complaint on file.  AFib  Wt Readings from Last 3 Encounters:  11/24/19 143 lb (64.9 kg)  11/18/18 155 lb (70.3 kg)  01/28/18 154 lb (69.9 kg)       History of Present Illness: [name] is a 79 y.o. female  with a hx of chronic AF, RA, HTN, HL. She is on Coumadin for anticoagulation.   Cardiac cath in 2012 showed no significant CAD.  She was admitted in 4/17 with chest pain. Echo demonstrated normal LVEF with mod pulmonary HTN (PASP 48 mmHg). CEs remained neg. No further ischemic workup was pursued. She was then readmitted 5/17 with HCAP with assoc moderate pleural effusion. She underwent left-sided thoracentesis. Effusion was felt to be exudative and concerning for infectious-empyema. Cytology was negative for malignancy.   She had a pericardial effusion in 11/17. THis was drained and was bloody.  She is intolerant of diltiazem.    In the past, she has had persistent DOE and occasional palpitations, worse if she walks and more with emotional stress.  In 12/2017. she accidentally took too much verapamil. She was hospitalized for 4 days.  She has had several bouts of pneumonia.   Her nose was cauterized in June 2020.   Since the last visit, she has required dental work for absces, reaction to ABx. Also had cataract operations bilaterally. Daughter with colon cancer- has had several surgeries.    Denies : Chest pain. Dizziness.  Nitroglycerin use. Orthopnea. Palpitations. Paroxysmal nocturnal dyspnea. Shortness of breath. Syncope.   She had her COVID vaccines x 2. She needs her booster.   Not getting dedicated exercise.  On her feet a lot taking care of husband.    Past Medical History:  Diagnosis Date  . Acne rosacea   . Adhesive capsulitis of left shoulder 1980  . Allergic rhinitis   .  Chronic atrial fibrillation ([place])    a. Coumadin anticoagulation - [facility] ([place])  . Chronic bronchitis ([place])   . Esophageal reflux   . Frequent epistaxis    "cause of my coumadin" (12/16/2015)  . Heart murmur   . History of echocardiogram    a. Echo 4/17:  EF 55-60%, no RWMA, mild MR, mild LAE, PASP 48 mmHg, trivial effusion post to heart  . Hypercholesterolemia   . Hyperlipidemia   . Hypertension   . Osteopenia   . Pleural effusion associated with pulmonary infection    Admx 5/17 >> required L thoracentesis (cytology neg for malignancy)   . Pneumonia    "I've had it 4 times this year" (12/16/2015)  . Polymorphic light eruption 2004  . Prolonged QT interval   . Pulmonary HTN ([place])   . Respiratory failure ([place]) 04/2015   "spent 3 days; went home; then another 13 days in hospital" (12/16/2015  . Rheumatoid arthritis ([place]) 1975  . Right middle ear infection 1986  . Skin cancer    "burndt it off at the right side of my nose/face" (12/16/2015)    Past Surgical History:  Procedure Laterality Date  . BREAST BIOPSY Left 2000s X 2  . CARDIAC CATHETERIZATION  03/2010   a. Myoview 2/09: no ischemia; low risk  //  b. [place] 3/12: normal coronary arteries  . CARDIAC CATHETERIZATION N/A 12/16/2015   Procedure: Pericardiocentesis;  Surgeon: Nelva Bush, MD;  Location: [facility];  Service: Cardiovascular;  Laterality: N/A;  . CARDIOVERSION  04/2010  . TEMPOROMANDIBULAR JOINT SURGERY Bilateral   . TOTAL ABDOMINAL HYSTERECTOMY       Current Outpatient Medications  Medication Sig Dispense Refill  . acetaminophen (TYLENOL) 500 MG tablet Take 1,000 mg by mouth every 6 (six) hours as needed for moderate pain.    Marland Kitchen acidophilus (RISAQUAD) CAPS capsule Take 1 capsule by mouth daily.    Marland Kitchen albuterol (PROVENTIL) (2.5 MG/3ML) 0.083% nebulizer solution Take 3 mLs (2.5 mg total) by nebulization every 6 (six) hours as needed for wheezing or shortness of breath. 75 mL 0  .  alendronate (FOSAMAX) 70 MG tablet Take 70 mg by mouth once a week.    . Ascorbic Acid (VITAMIN C) 1000 MG tablet Take 1,000 mg by mouth daily.    . cholecalciferol (VITAMIN D) 1000 UNITS tablet Take 2,000 Units by mouth daily.    Marland Kitchen etanercept (ENBREL) 50 MG/ML injection Inject 50 mg as directed once a week. Friday    . fluticasone (FLONASE) 50 MCG/ACT nasal spray Place 2 sprays into both nostrils daily as needed for rhinitis.     . folic acid (FOLVITE) 1 MG tablet Take 2 mg by mouth daily.     . furosemide (LASIX) 40 MG tablet TAKE 1 TABLET BY MOUTH EVERY DAY OR AS DIRECTED 90 tablet 2  . MAGNESIUM OXIDE PO Take 1 tablet by mouth daily. With Zinc and Calcium    . methotrexate (RHEUMATREX) 2.5 MG tablet Take 2.5 mg by mouth 2 (two) times a week. Pt takes 2.5 mg tablets every Tuesday and Wednesday  1  . Multiple Vitamins-Minerals (MULTIVITAMIN WITH MINERALS) tablet Take 1 tablet by mouth daily.    . potassium chloride (K-DUR,KLOR-CON) 10 MEQ tablet Take 2 tablets (20 mEq total) by mouth daily. 60 tablet 1  . pravastatin (PRAVACHOL) 20 MG tablet Take 20 mg by mouth at bedtime.    . predniSONE (DELTASONE) 2.5 MG tablet Take 2.5 mg by mouth daily with breakfast.     . traMADol (ULTRAM) 50 MG tablet Take 50 mg by mouth as needed for pain.  0  . verapamil (CALAN) 120 MG tablet Take 1 tablet (120 mg total) by mouth daily. 90 tablet 3  . warfarin (COUMADIN) 5 MG tablet TAKE 1/2 TO 1 TABLET BY MOUTH DAILY OR AS DIRECTED 100 tablet 1  . metoprolol succinate (TOPROL-XL) 50 MG 24 hr tablet Take 1 tablet (50 mg total) by mouth 2 (two) times daily with a meal. Take with or immediately following a meal. 60 tablet 0   No current facility-administered medications for this visit.    Allergies:   Arthrotec [diclofenac-misoprostol], Erythromycin, Morphine sulfate, Amoxicillin, Ciprofloxacin, Diltiazem hcl, Gold-containing drug products, Macrodantin [nitrofurantoin macrocrystal], Morphine, Naproxen, and Penicillins     Social History:  The patient  reports that she has never smoked. She has never used smokeless tobacco. She reports that she does not drink alcohol and does not use drugs.   Family History:  The patient's family history includes Healthy in her sister; Heart disease in her father, mother, sister, and sister; Other in her mother.    ROS:  Please see the history of present illness.   Otherwise, review of systems are positive for stress; hair loss; recent diagnosed osteoporosis; weight loss after dental issues.   All other systems are reviewed and negative.    PHYSICAL EXAM: VS:  BP 100/70   Pulse 65   Ht  5\' 3"  (1.6 m)   Wt 143 lb (64.9 kg)   SpO2 98%   BMI 25.33 kg/m  , BMI Body mass index is 25.33 kg/m. GEN: Well nourished, well developed, in no acute distress  HEENT: normal  Neck: no JVD, carotid bruits, or masses Cardiac: irregularly irregular, normal rate; no murmurs, rubs, or gallops,left ankle edema  Respiratory:  clear to auscultation bilaterally, normal work of breathing GI: soft, nontender, nondistended, + BS MS: no deformity or atrophy  Skin: warm and dry, no rash Neuro:  Strength and sensation are intact Psych: euthymic mood, full affect   EKG:   The ekg ordered today demonstrates AFib, rate controlled   Recent Labs: No results found for requested labs within last 8760 hours.   Lipid Panel    Component Value Date/Time   CHOL 134 05/24/2015 1344   TRIG 148.0 05/21/2013 0846   HDL 36.60 (L) 05/21/2013 0846   CHOLHDL 4 05/21/2013 0846   VLDL 29.6 05/21/2013 0846   LDLCALC 79 05/21/2013 0846     Other studies Reviewed: Additional studies/ records that were reviewed today with results demonstrating: LDL 70 in 2020. Cr 0.96 in 2021.   ASSESSMENT AND PLAN:  1. AFib: The current medical regimen is effective;  continue present plan and medications.  Rate controlled.  Warfarin for stroke prevention. 2. Anticoagulated: May need dental work.  Had embolic events  off of Coumadin.  Would need Lovenox bridge.  No active bleeding issues. INR 2.5 today. 3. HTN: The current medical regimen is effective;  continue present plan and medications. 4. DOE: Multiple episodes of pneumonia in the past. Some deconditioning.  Taking care of her husband is strenuous activity. 5. Elevated LFTs with MTX for arthritis. ALT normal in 7/21.   Current medicines are reviewed at length with the patient today.  The patient concerns regarding her medicines were addressed.  The following changes have been made:  No change  Labs/ tests ordered today include:  No orders of the defined types were placed in this encounter.   Recommend 150 minutes/week of aerobic exercise Low fat, low carb, high fiber diet recommended  Disposition:   FU in 1 year   Signed, Larae Grooms, MD  11/24/2019 1:30 PM    [facility] [place], [place], [place]  [address] Phone: [phone]; Fax: [phone]

## 2019-11-24 ENCOUNTER — Encounter: Payer: Self-pay | Admitting: Interventional Cardiology

## 2019-11-24 ENCOUNTER — Ambulatory Visit (INDEPENDENT_AMBULATORY_CARE_PROVIDER_SITE_OTHER): Payer: Medicare Other | Admitting: Interventional Cardiology

## 2019-11-24 ENCOUNTER — Ambulatory Visit (INDEPENDENT_AMBULATORY_CARE_PROVIDER_SITE_OTHER): Payer: Medicare Other | Admitting: *Deleted

## 2019-11-24 ENCOUNTER — Other Ambulatory Visit: Payer: Self-pay

## 2019-11-24 VITALS — BP 100/70 | HR 65 | Ht 63.0 in | Wt 143.0 lb

## 2019-11-24 DIAGNOSIS — Z5181 Encounter for therapeutic drug level monitoring: Secondary | ICD-10-CM | POA: Diagnosis not present

## 2019-11-24 DIAGNOSIS — I10 Essential (primary) hypertension: Secondary | ICD-10-CM | POA: Diagnosis not present

## 2019-11-24 DIAGNOSIS — Z7901 Long term (current) use of anticoagulants: Secondary | ICD-10-CM

## 2019-11-24 DIAGNOSIS — I4821 Permanent atrial fibrillation: Secondary | ICD-10-CM

## 2019-11-24 DIAGNOSIS — I4891 Unspecified atrial fibrillation: Secondary | ICD-10-CM

## 2019-11-24 DIAGNOSIS — R7989 Other specified abnormal findings of blood chemistry: Secondary | ICD-10-CM | POA: Diagnosis not present

## 2019-11-24 LAB — POCT INR: INR: 2.5 (ref 2.0–3.0)

## 2019-11-24 NOTE — Patient Instructions (Signed)

## 2019-11-24 NOTE — Patient Instructions (Signed)
Description   Continue taking 1/2 tablet daily except for 1 tablet on Sun, Tues, Thurs. Recheck INR in 3 weeks.  Call us with any medication changes or bleeding concerns, [phone], Main [phone].

## 2019-12-15 ENCOUNTER — Other Ambulatory Visit: Payer: Self-pay

## 2019-12-15 ENCOUNTER — Ambulatory Visit (INDEPENDENT_AMBULATORY_CARE_PROVIDER_SITE_OTHER): Payer: Medicare Other

## 2019-12-15 ENCOUNTER — Encounter (INDEPENDENT_AMBULATORY_CARE_PROVIDER_SITE_OTHER): Payer: Self-pay

## 2019-12-15 DIAGNOSIS — I4891 Unspecified atrial fibrillation: Secondary | ICD-10-CM

## 2019-12-15 DIAGNOSIS — Z5181 Encounter for therapeutic drug level monitoring: Secondary | ICD-10-CM | POA: Diagnosis not present

## 2019-12-15 DIAGNOSIS — Z7901 Long term (current) use of anticoagulants: Secondary | ICD-10-CM

## 2019-12-15 LAB — POCT INR: INR: 1.7 — AB (ref 2.0–3.0)

## 2019-12-15 NOTE — Patient Instructions (Signed)
Description   Take 1 tablet today, then start taking 1 tablet daily except 1/2 tablet on Mondays, Wednesdays and Fridays.  Recheck INR in 3 weeks.  Call us with any medication changes or bleeding concerns, [phone], Main [phone].

## 2020-01-05 ENCOUNTER — Other Ambulatory Visit: Payer: Self-pay

## 2020-01-05 ENCOUNTER — Ambulatory Visit (INDEPENDENT_AMBULATORY_CARE_PROVIDER_SITE_OTHER): Payer: Medicare Other | Admitting: Pharmacist

## 2020-01-05 DIAGNOSIS — Z5181 Encounter for therapeutic drug level monitoring: Secondary | ICD-10-CM | POA: Diagnosis not present

## 2020-01-05 LAB — POCT INR: INR: 2.7 (ref 2.0–3.0)

## 2020-01-05 NOTE — Patient Instructions (Signed)
Description   Continue taking 1 tablet daily except 1/2 tablet on Mondays, Wednesdays and Fridays.  Recheck INR in 4 weeks. Call us with any medication changes or bleeding concerns, [phone], Main [phone].      

## 2020-02-06 ENCOUNTER — Other Ambulatory Visit: Payer: Self-pay

## 2020-02-06 ENCOUNTER — Ambulatory Visit (INDEPENDENT_AMBULATORY_CARE_PROVIDER_SITE_OTHER): Payer: Medicare Other | Admitting: *Deleted

## 2020-02-06 DIAGNOSIS — Z5181 Encounter for therapeutic drug level monitoring: Secondary | ICD-10-CM | POA: Diagnosis not present

## 2020-02-06 LAB — POCT INR: INR: 1.9 — AB (ref 2.0–3.0)

## 2020-02-06 NOTE — Patient Instructions (Signed)
Description   Take 1 tablet today, then continue taking 1 tablet daily except 1/2 tablet on Mondays, Wednesdays and Fridays.  Recheck INR in 3 weeks.  Call us with any medication changes or bleeding concerns, [phone], Main [phone].

## 2020-03-04 ENCOUNTER — Ambulatory Visit (INDEPENDENT_AMBULATORY_CARE_PROVIDER_SITE_OTHER): Payer: Medicare Other | Admitting: *Deleted

## 2020-03-04 ENCOUNTER — Other Ambulatory Visit: Payer: Self-pay

## 2020-03-04 DIAGNOSIS — I4891 Unspecified atrial fibrillation: Secondary | ICD-10-CM

## 2020-03-04 DIAGNOSIS — Z5181 Encounter for therapeutic drug level monitoring: Secondary | ICD-10-CM | POA: Diagnosis not present

## 2020-03-04 LAB — POCT INR: INR: 2.2 (ref 2.0–3.0)

## 2020-03-04 NOTE — Patient Instructions (Signed)
Description   Continue taking 1 tablet daily except 1/2 tablet on Mondays, Wednesdays and Fridays.  Recheck INR in 4 weeks. Call us with any medication changes or bleeding concerns, [phone], Main [phone].

## 2020-04-02 ENCOUNTER — Ambulatory Visit (INDEPENDENT_AMBULATORY_CARE_PROVIDER_SITE_OTHER): Payer: Medicare Other | Admitting: *Deleted

## 2020-04-02 ENCOUNTER — Other Ambulatory Visit: Payer: Self-pay

## 2020-04-02 ENCOUNTER — Encounter (INDEPENDENT_AMBULATORY_CARE_PROVIDER_SITE_OTHER): Payer: Self-pay

## 2020-04-02 DIAGNOSIS — I4891 Unspecified atrial fibrillation: Secondary | ICD-10-CM

## 2020-04-02 DIAGNOSIS — Z5181 Encounter for therapeutic drug level monitoring: Secondary | ICD-10-CM

## 2020-04-02 LAB — POCT INR: INR: 2.9 (ref 2.0–3.0)

## 2020-04-02 NOTE — Patient Instructions (Signed)
Description   Continue taking 1 tablet daily except 1/2 tablet on Mondays, Wednesdays and Fridays.  Recheck INR in 4 weeks. Call us with any medication changes or bleeding concerns, [phone], Main [phone].

## 2020-04-29 ENCOUNTER — Ambulatory Visit (INDEPENDENT_AMBULATORY_CARE_PROVIDER_SITE_OTHER): Payer: Medicare Other | Admitting: Pharmacist

## 2020-04-29 ENCOUNTER — Other Ambulatory Visit: Payer: Self-pay

## 2020-04-29 DIAGNOSIS — Z5181 Encounter for therapeutic drug level monitoring: Secondary | ICD-10-CM

## 2020-04-29 LAB — POCT INR: INR: 1.7 — AB (ref 2.0–3.0)

## 2020-04-29 NOTE — Patient Instructions (Signed)
Description   Take 1.5 tablets today, then continue taking 1 tablet daily except 1/2 tablet on Mondays, Wednesdays and Fridays.  Recheck INR in 3 weeks. Call us with any medication changes or bleeding concerns, [phone], Main [phone].

## 2020-05-20 ENCOUNTER — Ambulatory Visit (INDEPENDENT_AMBULATORY_CARE_PROVIDER_SITE_OTHER): Payer: Medicare Other | Admitting: *Deleted

## 2020-05-20 ENCOUNTER — Other Ambulatory Visit: Payer: Self-pay

## 2020-05-20 ENCOUNTER — Encounter (INDEPENDENT_AMBULATORY_CARE_PROVIDER_SITE_OTHER): Payer: Self-pay

## 2020-05-20 DIAGNOSIS — Z5181 Encounter for therapeutic drug level monitoring: Secondary | ICD-10-CM | POA: Diagnosis not present

## 2020-05-20 LAB — POCT INR: INR: 1.7 — AB (ref 2.0–3.0)

## 2020-05-20 NOTE — Patient Instructions (Signed)
Description   Take 1.5 tablet of warfarin today,  then start taking warfarin 1 tablet daily except for 1/2 a tablet on Mondays and Fridays. Recheck INR in 2 weeks. Call us with any medication changes or bleeding concerns, [phone], Main [phone].

## 2020-06-03 ENCOUNTER — Telehealth: Payer: Self-pay | Admitting: *Deleted

## 2020-06-03 NOTE — Telephone Encounter (Signed)
Called pt since she missed her appt today; had to leave a message with her husband for her to call back to reschedule and he stated he would once she returned home.

## 2020-06-07 ENCOUNTER — Other Ambulatory Visit: Payer: Self-pay

## 2020-06-07 ENCOUNTER — Ambulatory Visit (INDEPENDENT_AMBULATORY_CARE_PROVIDER_SITE_OTHER): Payer: Medicare Other | Admitting: *Deleted

## 2020-06-07 DIAGNOSIS — Z5181 Encounter for therapeutic drug level monitoring: Secondary | ICD-10-CM | POA: Diagnosis not present

## 2020-06-07 LAB — POCT INR: INR: 2.3 (ref 2.0–3.0)

## 2020-06-07 NOTE — Patient Instructions (Signed)
Description   Continue taking warfarin 1 tablet daily except for 1/2 a tablet on Mondays and Fridays. Recheck INR in 3 weeks. Call us with any medication changes or bleeding concerns, [phone], Main [phone].

## 2020-07-04 ENCOUNTER — Other Ambulatory Visit: Payer: Self-pay | Admitting: Interventional Cardiology

## 2020-07-15 ENCOUNTER — Other Ambulatory Visit: Payer: Self-pay

## 2020-07-15 ENCOUNTER — Encounter (INDEPENDENT_AMBULATORY_CARE_PROVIDER_SITE_OTHER): Payer: Self-pay

## 2020-07-15 ENCOUNTER — Ambulatory Visit (INDEPENDENT_AMBULATORY_CARE_PROVIDER_SITE_OTHER): Payer: Medicare Other | Admitting: *Deleted

## 2020-07-15 DIAGNOSIS — I4891 Unspecified atrial fibrillation: Secondary | ICD-10-CM

## 2020-07-15 DIAGNOSIS — Z5181 Encounter for therapeutic drug level monitoring: Secondary | ICD-10-CM

## 2020-07-15 LAB — POCT INR: INR: 3.8 — AB (ref 2.0–3.0)

## 2020-07-15 NOTE — Patient Instructions (Signed)
Description   Do not take any Warfarin today then continue taking warfarin 1 tablet daily except for 1/2 tablet on Mondays and Fridays. Recheck INR in 3 weeks. Call us with any medication changes or bleeding concerns, [phone], Main [phone].

## 2020-08-05 ENCOUNTER — Other Ambulatory Visit: Payer: Self-pay

## 2020-08-05 ENCOUNTER — Ambulatory Visit (INDEPENDENT_AMBULATORY_CARE_PROVIDER_SITE_OTHER): Payer: Medicare Other

## 2020-08-05 DIAGNOSIS — Z5181 Encounter for therapeutic drug level monitoring: Secondary | ICD-10-CM | POA: Diagnosis not present

## 2020-08-05 DIAGNOSIS — I4891 Unspecified atrial fibrillation: Secondary | ICD-10-CM

## 2020-08-05 LAB — POCT INR: INR: 3.1 — AB (ref 2.0–3.0)

## 2020-08-05 NOTE — Patient Instructions (Signed)
Description   Take 1/2 tablet today, then resume same dosage of warfarin 1 tablet daily except for 1/2 tablet on Mondays and Fridays. Recheck INR in 3 weeks. Call us with any medication changes or bleeding concerns, [phone], Main [phone].

## 2020-08-26 ENCOUNTER — Other Ambulatory Visit: Payer: Self-pay

## 2020-08-26 ENCOUNTER — Ambulatory Visit (INDEPENDENT_AMBULATORY_CARE_PROVIDER_SITE_OTHER): Payer: Medicare Other | Admitting: *Deleted

## 2020-08-26 DIAGNOSIS — I4891 Unspecified atrial fibrillation: Secondary | ICD-10-CM | POA: Diagnosis not present

## 2020-08-26 DIAGNOSIS — Z5181 Encounter for therapeutic drug level monitoring: Secondary | ICD-10-CM

## 2020-08-26 LAB — POCT INR: INR: 2.2 (ref 2.0–3.0)

## 2020-08-26 NOTE — Patient Instructions (Signed)
Description   Continue taking Warfarin 1 tablet daily except for 1/2 tablet on Mondays and Fridays. Recheck INR in 4 weeks. Call us with any medication changes or bleeding concerns, [phone], Main [phone].

## 2020-09-22 ENCOUNTER — Other Ambulatory Visit: Payer: Self-pay | Admitting: Interventional Cardiology

## 2020-09-22 NOTE — Telephone Encounter (Signed)
Prescription refill request received for warfarin [place]: 11/24/19 [place])  Next INR check: 09/23/20 Warfarin tablet strength: '5mg'$   Appropriate dose and refill sent to requested pharmacy.

## 2020-09-23 ENCOUNTER — Other Ambulatory Visit: Payer: Self-pay

## 2020-09-23 ENCOUNTER — Ambulatory Visit (INDEPENDENT_AMBULATORY_CARE_PROVIDER_SITE_OTHER): Payer: Medicare Other

## 2020-09-23 DIAGNOSIS — Z5181 Encounter for therapeutic drug level monitoring: Secondary | ICD-10-CM | POA: Diagnosis not present

## 2020-09-23 DIAGNOSIS — I4891 Unspecified atrial fibrillation: Secondary | ICD-10-CM

## 2020-09-23 LAB — POCT INR: INR: 2.2 (ref 2.0–3.0)

## 2020-09-23 NOTE — Patient Instructions (Signed)
Description   Continue taking Warfarin 1 tablet daily except for 1/2 tablet on Mondays and Fridays. Recheck INR in 4 weeks. Call us with any medication changes or bleeding concerns, [phone], Main [phone].

## 2020-10-18 ENCOUNTER — Encounter (HOSPITAL_BASED_OUTPATIENT_CLINIC_OR_DEPARTMENT_OTHER): Payer: Self-pay

## 2020-10-18 ENCOUNTER — Other Ambulatory Visit: Payer: Self-pay

## 2020-10-18 ENCOUNTER — Emergency Department (HOSPITAL_BASED_OUTPATIENT_CLINIC_OR_DEPARTMENT_OTHER): Payer: Medicare Other | Admitting: Radiology

## 2020-10-18 ENCOUNTER — Emergency Department (HOSPITAL_BASED_OUTPATIENT_CLINIC_OR_DEPARTMENT_OTHER)
Admission: EM | Admit: 2020-10-18 | Discharge: 2020-10-19 | Disposition: A | Discharge status: Home / Self Care | Payer: Medicare Other | Attending: Emergency Medicine | Admitting: Emergency Medicine

## 2020-10-18 DIAGNOSIS — I4891 Unspecified atrial fibrillation: Secondary | ICD-10-CM | POA: Diagnosis not present

## 2020-10-18 DIAGNOSIS — Z85828 Personal history of other malignant neoplasm of skin: Secondary | ICD-10-CM | POA: Diagnosis not present

## 2020-10-18 DIAGNOSIS — R079 Chest pain, unspecified: Secondary | ICD-10-CM | POA: Diagnosis present

## 2020-10-18 DIAGNOSIS — I10 Essential (primary) hypertension: Secondary | ICD-10-CM | POA: Diagnosis not present

## 2020-10-18 LAB — BASIC METABOLIC PANEL
Anion gap: 9 (ref 5–15)
BUN: 16 mg/dL (ref 8–23)
CO2: 25 mmol/L (ref 22–32)
Calcium: 8.9 mg/dL (ref 8.9–10.3)
Chloride: 107 mmol/L (ref 98–111)
Creatinine, Ser: 0.88 mg/dL (ref 0.44–1.00)
GFR, Estimated: >60 mL/min (ref >60)
Glucose, Bld: 88 mg/dL (ref 70–99)
Potassium: 3.9 mmol/L (ref 3.5–5.1)
Sodium: 141 mmol/L (ref 135–145)

## 2020-10-18 LAB — CBC
HCT: 43 % (ref 36.0–46.0)
Hemoglobin: 14.4 g/dL (ref 12.0–15.0)
MCH: 32.1 pg (ref 26.0–34.0)
MCHC: 33.5 g/dL (ref 30.0–36.0)
MCV: 96 fL (ref 80.0–100.0)
Platelets: 129 10*3/uL — ABNORMAL LOW (ref 150–400)
RBC: 4.48 MIL/uL (ref 3.87–5.11)
RDW: 13.6 % (ref 11.5–15.5)
WBC: 5.8 10*3/uL (ref 4.0–10.5)
nRBC: 0 % (ref 0.0–0.2)

## 2020-10-18 LAB — TROPONIN I (HIGH SENSITIVITY): Troponin I (High Sensitivity): 3 ng/L (ref <18)

## 2020-10-18 NOTE — ED Triage Notes (Signed)
Patient here POV from Home with CP.  Patient states Pain began approximately 24-48 hours PTA. Patient took Tylenol last PM and Pain subsided Mildly. Pain radiates from Left Chest to Left Arm.   History of A. Fib. NAD Noted during Triage. Mild to Moderate Nausea Intermittently.

## 2020-10-19 LAB — PROTIME-INR
INR: 1.7 — ABNORMAL HIGH (ref 0.8–1.2)
Prothrombin Time: 20.1 seconds — ABNORMAL HIGH (ref 11.4–15.2)

## 2020-10-19 LAB — TROPONIN I (HIGH SENSITIVITY): Troponin I (High Sensitivity): 4 ng/L (ref <18)

## 2020-10-19 NOTE — Discharge Instructions (Addendum)
You were evaluated in the Emergency Department and after careful evaluation, we did not find any emergent condition requiring admission or further testing in the hospital.  Your exam/testing today was overall reassuring.  Recommend close follow-up with your cardiologist.  Please return to the Emergency Department if you experience any worsening of your condition.  Thank you for allowing [place] to be a part of your care.

## 2020-10-19 NOTE — ED Provider Notes (Signed)
[facility] Emergency Department Provider Note MRN:  [id number]  Arrival date & time: 10/19/20     Chief Complaint   Chest Pain   History of Present Illness   [name] is a 80 y.o. year-old female with a history of A. fib, RA, pulmonary hypertension presenting to the ED with chief complaint of chest.  Location: Central chest with radiation to the left arm and fingers Duration: 3 to 4 days Onset: Sudden Timing: Intermittent Description: Sharp Severity: Mild to moderate Exacerbating/Alleviating Factors: Seems worse after raking the leaves and acorns over the past few days Associated Symptoms: Nausea Pertinent Negatives: No vomiting, no dizziness or diaphoresis, no shortness of breath  Additional History: None  Review of Systems  A complete 10 system review of systems was obtained and all systems are negative except as noted in the HPI and PMH.   Patient's Health History    Past Medical History:  Diagnosis Date   Acne rosacea    Adhesive capsulitis of left shoulder 1980   Allergic rhinitis    Chronic atrial fibrillation ([place])    a. Coumadin anticoagulation - [facility] ([address])   Chronic bronchitis ([place])    Esophageal reflux    Frequent epistaxis    "cause of my coumadin" (12/16/2015)   Heart murmur    History of echocardiogram    a. Echo 4/17:  EF 55-60%, no RWMA, mild MR, mild LAE, PASP 48 mmHg, trivial effusion post to heart   Hypercholesterolemia    Hyperlipidemia    Hypertension    Osteopenia    Pleural effusion associated with pulmonary infection    Admx 5/17 >> required L thoracentesis (cytology neg for malignancy)    Pneumonia    "I've had it 4 times this year" (12/16/2015)   Polymorphic light eruption 2004   Prolonged QT interval    Pulmonary HTN ([place])    Respiratory failure ([place]) 04/2015   "spent 3 days; went home; then another 13 days in hospital" (12/16/2015   Rheumatoid arthritis ([place]) 1975   Right middle ear  infection 1986   Skin cancer    "burndt it off at the right side of my nose/face" (12/16/2015)    Past Surgical History:  Procedure Laterality Date   BREAST BIOPSY Left 2000s X 2   CARDIAC CATHETERIZATION  03/2010   a. Myoview 2/09: no ischemia; low risk  //  b. LHC 3/12: normal coronary arteries   CARDIAC CATHETERIZATION N/A 12/16/2015   Procedure: Pericardiocentesis;  Surgeon: Nelva Bush, MD;  Location: [facility];  Service: Cardiovascular;  Laterality: N/A;   CARDIOVERSION  04/2010   TEMPOROMANDIBULAR JOINT SURGERY Bilateral    TOTAL ABDOMINAL HYSTERECTOMY      Family History  Problem Relation Age of Onset   Heart disease Father    Heart disease Mother    Other Mother        prolonged qtc   Heart disease Sister    Heart disease Sister    Healthy Sister    Fainting Neg Hx     Social History   Socioeconomic History   Marital status: Married    Spouse name: Not on file   Number of children: Not on file   Years of education: Not on file   Highest education level: Not on file  Occupational History   Not on file  Tobacco Use   Smoking status: Never   Smokeless tobacco: Never  Vaping Use   Vaping Use: Never used  Substance and Sexual Activity   Alcohol use: No   Drug use: No   Sexual activity: Never  Other Topics Concern   Not on file  Social History Narrative   Not on file   Social Determinants of Health   Financial Resource Strain: Not on file  Food Insecurity: Not on file  Transportation Needs: Not on file  Physical Activity: Not on file  Stress: Not on file  Social Connections: Not on file  Intimate Partner Violence: Not on file     Physical Exam   Vitals:   10/19/20 0200 10/19/20 0240  BP: (!) 149/84 (!) 120/110  Pulse: (!) 35 88  Resp: 19 19  Temp:    SpO2: 98% 96%    CONSTITUTIONAL: Well-appearing, NAD NEURO:  Alert and oriented x 3, no focal deficits EYES:  eyes equal and reactive ENT/NECK:  no LAD, no JVD CARDIO: Regular  rate, well-perfused, normal S1 and S2 PULM:  CTAB no wheezing or rhonchi GI/GU:  normal bowel sounds, non-distended, non-tender MSK/SPINE:  No gross deformities, no edema SKIN:  no rash, atraumatic PSYCH:  Appropriate speech and behavior  *Additional and/or pertinent findings included in MDM below  Diagnostic and Interventional Summary    EKG Interpretation  Date/Time:  Monday October 18 2020 21:19:41 EDT Ventricular Rate:  123 PR Interval:    QRS Duration: 66 QT Interval:  402 QTC Calculation: 575 R Axis:   -5 Text Interpretation: A. fib low voltage QRS Cannot rule out Anterior infarct , age undetermined Abnormal ECG Confirmed by Gerlene Fee [phone]) on 10/18/2020 10:59:20 PM       Labs Reviewed  CBC - Abnormal; Notable for the following components:      Result Value   Platelets 129 (*)    All other components within normal limits  PROTIME-INR - Abnormal; Notable for the following components:   Prothrombin Time 20.1 (*)    INR 1.7 (*)    All other components within normal limits  BASIC METABOLIC PANEL  TROPONIN I (HIGH SENSITIVITY)  TROPONIN I (HIGH SENSITIVITY)    DG Chest 2 View  Final Result      Medications - No data to display   Procedures  /  Critical Care Procedures  ED Course and Medical Decision Making  I have reviewed the triage vital signs, the nursing notes, and pertinent available records from the EMR.  Listed above are laboratory and imaging tests that I personally ordered, reviewed, and interpreted and then considered in my medical decision making (see below for details).  Differential diagnosis includes ACS, musculoskeletal cause.  Doubt PE given lack of evidence of DVT, no shortness of breath, no hypoxia, already anticoagulated on Coumadin.  Nothing to suggest dissection.  No fever or cough.  First troponin is negative, awaiting delta.  Patient is in A. fib, she says that she is not normally in A. fib however most of her prior EKGs on record  reveal A. fib.  She does not know when the A. fib started, heart rates are well controlled in the 90s, and so seems like a poor candidate for cardioversion at this time.     Second troponin is negative, pain more and more seems to be musculoskeletal or arthritic in nature, patient is appropriate for discharge.  Barth Kirks. Sedonia Small, MD [place] [email or web address]  Final Clinical Impressions(s) / ED Diagnoses     ICD-10-CM   1. Atrial fibrillation, unspecified type ([place])  I48.91 Protime-INR  Protime-INR    2. Chest pain, unspecified type  R07.9       ED Discharge Orders     None        Discharge Instructions Discussed with and Provided to Patient:     Discharge Instructions      You were evaluated in the Emergency Department and after careful evaluation, we did not find any emergent condition requiring admission or further testing in the hospital.  Your exam/testing today was overall reassuring.  Recommend close follow-up with your cardiologist.  Please return to the Emergency Department if you experience any worsening of your condition.  Thank you for allowing [place] to be a part of your care.         Maudie Flakes, MD 10/19/20 [phone]

## 2020-10-21 ENCOUNTER — Other Ambulatory Visit: Payer: Self-pay

## 2020-10-21 ENCOUNTER — Ambulatory Visit (INDEPENDENT_AMBULATORY_CARE_PROVIDER_SITE_OTHER): Payer: Medicare Other | Admitting: *Deleted

## 2020-10-21 DIAGNOSIS — I4891 Unspecified atrial fibrillation: Secondary | ICD-10-CM | POA: Diagnosis not present

## 2020-10-21 DIAGNOSIS — Z5181 Encounter for therapeutic drug level monitoring: Secondary | ICD-10-CM

## 2020-10-21 LAB — POCT INR: INR: 1.4 — AB (ref 2.0–3.0)

## 2020-10-21 NOTE — Patient Instructions (Addendum)
Description   Today take 1.5 tablets and tomorrow take 1 tablet then continue taking Warfarin 1 tablet daily except for 1/2 tablet on Mondays and Fridays. Recheck INR in 1 week (normally 4 weeks). Call us with any medication changes or bleeding concerns, [phone], Main [phone].

## 2020-10-28 ENCOUNTER — Ambulatory Visit (INDEPENDENT_AMBULATORY_CARE_PROVIDER_SITE_OTHER): Payer: Medicare Other | Admitting: *Deleted

## 2020-10-28 ENCOUNTER — Other Ambulatory Visit: Payer: Self-pay

## 2020-10-28 DIAGNOSIS — Z5181 Encounter for therapeutic drug level monitoring: Secondary | ICD-10-CM | POA: Diagnosis not present

## 2020-10-28 DIAGNOSIS — I4891 Unspecified atrial fibrillation: Secondary | ICD-10-CM

## 2020-10-28 LAB — POCT INR: INR: 2.9 (ref 2.0–3.0)

## 2020-10-28 NOTE — Patient Instructions (Addendum)
Description   Today take 1/2 tablet, Friday take 1/2 tablet, Saturday take 1 tablet and Sunday take 1/2 tablet then continue taking Warfarin 1 tablet daily except for 1/2 tablet on Mondays and Fridays. Recheck INR on Monday (on prednisone). Call us with any medication changes or bleeding concerns, [phone], Main [phone].

## 2020-11-01 ENCOUNTER — Other Ambulatory Visit: Payer: Self-pay

## 2020-11-01 ENCOUNTER — Ambulatory Visit (INDEPENDENT_AMBULATORY_CARE_PROVIDER_SITE_OTHER): Payer: Medicare Other | Admitting: *Deleted

## 2020-11-01 DIAGNOSIS — Z5181 Encounter for therapeutic drug level monitoring: Secondary | ICD-10-CM | POA: Diagnosis not present

## 2020-11-01 DIAGNOSIS — I4891 Unspecified atrial fibrillation: Secondary | ICD-10-CM | POA: Diagnosis not present

## 2020-11-01 LAB — POCT INR: INR: 4.3 — AB (ref 2.0–3.0)

## 2020-11-01 NOTE — Patient Instructions (Signed)
Description   Do not take any Warfarin today and do not take any Warfarin tomorrow then continue taking Warfarin 1 tablet daily except for 1/2 tablet on Mondays and Fridays. Recheck INR in 1 week. Call us with any medication changes or bleeding concerns, [phone], Main [phone].

## 2020-11-08 ENCOUNTER — Ambulatory Visit (INDEPENDENT_AMBULATORY_CARE_PROVIDER_SITE_OTHER): Payer: Medicare Other

## 2020-11-08 ENCOUNTER — Other Ambulatory Visit: Payer: Self-pay

## 2020-11-08 DIAGNOSIS — Z5181 Encounter for therapeutic drug level monitoring: Secondary | ICD-10-CM | POA: Diagnosis not present

## 2020-11-08 LAB — POCT INR: INR: 1.7 — AB (ref 2.0–3.0)

## 2020-11-08 NOTE — Patient Instructions (Signed)
Description   Take 1 tablet today and then continue taking Warfarin 1 tablet daily except for 1/2 tablet on Mondays and Fridays. Be consistent with greens. Recheck INR in 1 week. Call us with any medication changes or bleeding concerns, [phone], Main [phone].
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
## 2020-11-19 ENCOUNTER — Other Ambulatory Visit: Payer: Self-pay

## 2020-11-19 ENCOUNTER — Ambulatory Visit (INDEPENDENT_AMBULATORY_CARE_PROVIDER_SITE_OTHER): Payer: Medicare Other | Admitting: Interventional Cardiology

## 2020-11-19 ENCOUNTER — Ambulatory Visit (INDEPENDENT_AMBULATORY_CARE_PROVIDER_SITE_OTHER): Payer: Medicare Other

## 2020-11-19 ENCOUNTER — Encounter: Payer: Self-pay | Admitting: Interventional Cardiology

## 2020-11-19 VITALS — BP 128/60 | HR 79 | Ht 63.0 in | Wt 136.8 lb

## 2020-11-19 DIAGNOSIS — I10 Essential (primary) hypertension: Secondary | ICD-10-CM

## 2020-11-19 DIAGNOSIS — Z5181 Encounter for therapeutic drug level monitoring: Secondary | ICD-10-CM | POA: Diagnosis not present

## 2020-11-19 DIAGNOSIS — Z7901 Long term (current) use of anticoagulants: Secondary | ICD-10-CM

## 2020-11-19 DIAGNOSIS — I4821 Permanent atrial fibrillation: Secondary | ICD-10-CM | POA: Diagnosis not present

## 2020-11-19 DIAGNOSIS — R7989 Other specified abnormal findings of blood chemistry: Secondary | ICD-10-CM | POA: Diagnosis not present

## 2020-11-19 LAB — POCT INR: INR: 1.4 — AB (ref 2.0–3.0)

## 2020-11-19 NOTE — Patient Instructions (Signed)
-   take 1.5 tablets warfarin tonight and tomorrow, then  - on Sunday START NEW DOSAGE of warfarin 1 tablet every day.  Be consistent with greens.  - Recheck INR in 1 week.  Call us with any medication changes or bleeding concerns, [phone], Main [phone].

## 2020-11-19 NOTE — Patient Instructions (Signed)
Medication Instructions:  Your physician recommends that you continue on your current medications as directed. Please refer to the Current Medication list given to you today.  *If you need a refill on your cardiac medications before your next appointment, please call your pharmacy*   Lab Work: None If you have labs (blood work) drawn today and your tests are completely normal, you will receive your results only by: [place] (if you have MyChart) OR A paper copy in the mail If you have any lab test that is abnormal or we need to change your treatment, we will call you to review the results.   Follow-Up: At [facility], you and your health needs are our priority.  As part of our continuing mission to provide you with exceptional heart care, we have created designated Provider Care Teams.  These Care Teams include your primary Cardiologist (physician) and Advanced Practice Providers (APPs -  Physician Assistants and Nurse Practitioners) who all work together to provide you with the care you need, when you need it.  We recommend signing up for the patient portal called "MyChart".  Sign up information is provided on this After Visit Summary.  MyChart is used to connect with patients for Virtual Visits (Telemedicine).  Patients are able to view lab/test results, encounter notes, upcoming appointments, etc.  Non-urgent messages can be sent to your provider as well.   To learn more about what you can do with MyChart, go to [email or web address].    Your next appointment:   1 year(s)  The format for your next appointment:   In Person  Provider:   Larae Grooms, MD {

## 2020-11-22 ENCOUNTER — Encounter (HOSPITAL_COMMUNITY): Payer: Self-pay

## 2020-11-22 ENCOUNTER — Other Ambulatory Visit: Payer: Self-pay

## 2020-11-22 ENCOUNTER — Emergency Department (HOSPITAL_COMMUNITY)
Admission: EM | Admit: 2020-11-22 | Discharge: 2020-11-23 | Disposition: A | Discharge status: Home / Self Care | Payer: Medicare Other | Attending: Emergency Medicine | Admitting: Emergency Medicine

## 2020-11-22 ENCOUNTER — Emergency Department (HOSPITAL_COMMUNITY): Payer: Medicare Other

## 2020-11-22 ENCOUNTER — Telehealth: Payer: Self-pay | Admitting: *Deleted

## 2020-11-22 DIAGNOSIS — M25551 Pain in right hip: Secondary | ICD-10-CM | POA: Diagnosis not present

## 2020-11-22 DIAGNOSIS — Z7902 Long term (current) use of antithrombotics/antiplatelets: Secondary | ICD-10-CM | POA: Diagnosis not present

## 2020-11-22 DIAGNOSIS — Z79899 Other long term (current) drug therapy: Secondary | ICD-10-CM | POA: Insufficient documentation

## 2020-11-22 DIAGNOSIS — Z20822 Contact with and (suspected) exposure to covid-19: Secondary | ICD-10-CM | POA: Diagnosis not present

## 2020-11-22 DIAGNOSIS — I5032 Chronic diastolic (congestive) heart failure: Secondary | ICD-10-CM | POA: Diagnosis not present

## 2020-11-22 DIAGNOSIS — Z046 Encounter for general psychiatric examination, requested by authority: Secondary | ICD-10-CM | POA: Insufficient documentation

## 2020-11-22 DIAGNOSIS — M25552 Pain in left hip: Secondary | ICD-10-CM | POA: Insufficient documentation

## 2020-11-22 DIAGNOSIS — I482 Chronic atrial fibrillation, unspecified: Secondary | ICD-10-CM | POA: Diagnosis not present

## 2020-11-22 DIAGNOSIS — I110 Hypertensive heart disease with heart failure: Secondary | ICD-10-CM | POA: Diagnosis not present

## 2020-11-22 DIAGNOSIS — F419 Anxiety disorder, unspecified: Secondary | ICD-10-CM | POA: Insufficient documentation

## 2020-11-22 DIAGNOSIS — R519 Headache, unspecified: Secondary | ICD-10-CM | POA: Diagnosis not present

## 2020-11-22 DIAGNOSIS — F69 Unspecified disorder of adult personality and behavior: Secondary | ICD-10-CM

## 2020-11-22 DIAGNOSIS — F321 Major depressive disorder, single episode, moderate: Secondary | ICD-10-CM | POA: Insufficient documentation

## 2020-11-22 DIAGNOSIS — M79642 Pain in left hand: Secondary | ICD-10-CM | POA: Insufficient documentation

## 2020-11-22 LAB — CBC WITH DIFFERENTIAL/PLATELET
Abs Immature Granulocytes: 0.02 10*3/uL (ref 0.00–0.07)
Basophils Absolute: 0 10*3/uL (ref 0.0–0.1)
Basophils Relative: 0 %
Eosinophils Absolute: 0.1 10*3/uL (ref 0.0–0.5)
Eosinophils Relative: 1 %
HCT: 43.3 % (ref 36.0–46.0)
Hemoglobin: 14.8 g/dL (ref 12.0–15.0)
Immature Granulocytes: 0 %
Lymphocytes Relative: 25 %
Lymphs Abs: 1.6 10*3/uL (ref 0.7–4.0)
MCH: 32.8 pg (ref 26.0–34.0)
MCHC: 34.2 g/dL (ref 30.0–36.0)
MCV: 96 fL (ref 80.0–100.0)
Monocytes Absolute: 0.6 10*3/uL (ref 0.1–1.0)
Monocytes Relative: 10 %
Neutro Abs: 4 10*3/uL (ref 1.7–7.7)
Neutrophils Relative %: 64 %
Platelets: 118 10*3/uL — ABNORMAL LOW (ref 150–400)
RBC: 4.51 MIL/uL (ref 3.87–5.11)
RDW: 13.3 % (ref 11.5–15.5)
WBC: 6.3 10*3/uL (ref 4.0–10.5)
nRBC: 0 % (ref 0.0–0.2)

## 2020-11-22 LAB — COMPREHENSIVE METABOLIC PANEL
ALT: 36 U/L (ref 0–44)
AST: 38 U/L (ref 15–41)
Albumin: 3.7 g/dL (ref 3.5–5.0)
Alkaline Phosphatase: 57 U/L (ref 38–126)
Anion gap: 10 (ref 5–15)
BUN: 13 mg/dL (ref 8–23)
CO2: 25 mmol/L (ref 22–32)
Calcium: 9.1 mg/dL (ref 8.9–10.3)
Chloride: 103 mmol/L (ref 98–111)
Creatinine, Ser: 0.98 mg/dL (ref 0.44–1.00)
GFR, Estimated: 59 mL/min — ABNORMAL LOW (ref >60)
Glucose, Bld: 85 mg/dL (ref 70–99)
Potassium: 3.3 mmol/L — ABNORMAL LOW (ref 3.5–5.1)
Sodium: 138 mmol/L (ref 135–145)
Total Bilirubin: 0.8 mg/dL (ref 0.3–1.2)
Total Protein: 6.7 g/dL (ref 6.5–8.1)

## 2020-11-22 LAB — RAPID URINE DRUG SCREEN, HOSP PERFORMED
Amphetamines: NOT DETECTED
Barbiturates: NOT DETECTED
Benzodiazepines: NOT DETECTED
Cocaine: NOT DETECTED
Opiates: NOT DETECTED
Tetrahydrocannabinol: NOT DETECTED

## 2020-11-22 LAB — URINALYSIS, ROUTINE W REFLEX MICROSCOPIC
Bacteria, UA: NONE SEEN
Bilirubin Urine: NEGATIVE
Glucose, UA: NEGATIVE mg/dL
Hgb urine dipstick: NEGATIVE
Ketones, ur: NEGATIVE mg/dL
Nitrite: NEGATIVE
Protein, ur: NEGATIVE mg/dL
Specific Gravity, Urine: 1.01 (ref 1.005–1.030)
pH: 8 (ref 5.0–8.0)

## 2020-11-22 LAB — TSH: TSH: 2.723 u[IU]/mL (ref 0.350–4.500)

## 2020-11-22 LAB — ETHANOL: Alcohol, Ethyl (B): <10 mg/dL (ref <10)

## 2020-11-22 LAB — RESP PANEL BY RT-PCR (FLU A&B, COVID) ARPGX2
Influenza A by PCR: NEGATIVE
Influenza B by PCR: NEGATIVE
SARS Coronavirus 2 by RT PCR: NEGATIVE

## 2020-11-22 LAB — PROTIME-INR
INR: 2.2 — ABNORMAL HIGH (ref 0.8–1.2)
Prothrombin Time: 24.3 seconds — ABNORMAL HIGH (ref 11.4–15.2)

## 2020-11-22 LAB — MAGNESIUM: Magnesium: 1.9 mg/dL (ref 1.7–2.4)

## 2020-11-22 MED ORDER — ACETAMINOPHEN 500 MG PO TABS
1000.0000 mg | ORAL_TABLET | Freq: Four times a day (QID) | ORAL | Status: DC | PRN
  Filled 2020-11-22: qty 2

## 2020-11-22 MED ORDER — METOPROLOL SUCCINATE ER 50 MG PO TB24
50.0000 mg | ORAL_TABLET | Freq: Two times a day (BID) | ORAL | Status: DC
  Administered 2020-11-23: 50 mg via ORAL
  Filled 2020-11-22: qty 1

## 2020-11-22 MED ORDER — ALBUTEROL SULFATE (2.5 MG/3ML) 0.083% IN NEBU: 2.5000 mg | INHALATION_SOLUTION | Freq: Four times a day (QID) | RESPIRATORY_TRACT | Status: DC | PRN

## 2020-11-22 MED ORDER — MAGNESIUM OXIDE -MG SUPPLEMENT 400 (240 MG) MG PO TABS
200.0000 mg | ORAL_TABLET | Freq: Every day | ORAL | Status: DC
  Administered 2020-11-23: 200 mg via ORAL
  Filled 2020-11-22: qty 1

## 2020-11-22 MED ORDER — WARFARIN SODIUM 2.5 MG PO TABS
2.5000 mg | ORAL_TABLET | Freq: Every day | ORAL | Status: AC
  Administered 2020-11-23: 2.5 mg via ORAL
  Filled 2020-11-22: qty 1

## 2020-11-22 MED ORDER — FUROSEMIDE 40 MG PO TABS
40.0000 mg | ORAL_TABLET | Freq: Every day | ORAL | Status: DC
  Administered 2020-11-23: 40 mg via ORAL
  Filled 2020-11-22: qty 1

## 2020-11-22 MED ORDER — PRAVASTATIN SODIUM 20 MG PO TABS
20.0000 mg | ORAL_TABLET | Freq: Every day | ORAL | Status: DC
  Administered 2020-11-23: 20 mg via ORAL
  Filled 2020-11-22: qty 1

## 2020-11-22 MED ORDER — POTASSIUM CHLORIDE CRYS ER 20 MEQ PO TBCR
20.0000 meq | EXTENDED_RELEASE_TABLET | Freq: Every day | ORAL | Status: DC
  Administered 2020-11-23: 20 meq via ORAL
  Filled 2020-11-22: qty 1

## 2020-11-22 NOTE — ED Notes (Signed)
Pt was changed out into burgundy scrubs, pt was wanded by security. Pt belonging bag is in the cabinets behind nursing station. Please make sure get the key and yellow piece of paper from orange folder, she has 3 credit cards and $95.00 that has been locked up in security office.
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
## 2020-11-22 NOTE — Telephone Encounter (Signed)
Patient notified pharmacist has made some recommendations and that final decision will be made by Dr Kathlene November.

## 2020-11-22 NOTE — Telephone Encounter (Signed)
-----   Message from Jettie Booze, MD sent at 11/22/2020  8:45 AM EST ----- Please inform patient that our pharmacist has made some recs for meds to treat RA.  Final decision will be from Dr. Kathlene November as to which medicine to choose.    JV ----- Message ----- From: Rollen Sox, [facility] Sent: 11/19/2020   4:04 PM EST To: Jettie Booze, MD, [place] Rocker, MD  This case is challenging due to her comorbidities and potential for drug drug interactions, especially with her warfarin.  The safest DMARDs would likely be sulfasalazine followed by leflunomide however both would require close INR and LFT monitoring.    If she is having trouble paying for her Enbrel I recommend applying for patient assistance.  I have had good luck with patients being approved through the [employer].  The website is:  https://www.amgensafetynetfoundation.com/how-to-apply.html   ----- Message ----- From: Jettie Booze, MD Sent: 11/19/2020   3:13 PM EDT To: Lahoma Rocker, MD, Rollen Sox, [facility]  Dr. Kathlene November,  I saw sawsan today.  She is very scared of potential side effects from Plaquenil.  I explained to her that we could try it and check her QT interval to see if there are issues but she does not want to try this medicine based on what her pharmacist told her.  I am checking with our Pharm.D.'s in the office to see what substitutes there may be that would not have cardiac side effects.  She is happy to continue her Enbrel as she thinks this helps her a lot.  It is unfortunate that she had the elevated LFTs with methotrexate.  JV
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
## 2020-11-23 DIAGNOSIS — F321 Major depressive disorder, single episode, moderate: Secondary | ICD-10-CM | POA: Diagnosis not present

## 2020-11-23 MED ORDER — WARFARIN - PHYSICIAN DOSING INPATIENT: Freq: Every day | Status: DC
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
## 2020-11-23 NOTE — ED Notes (Signed)
Pt given breakfast tray
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
## 2020-11-23 NOTE — ED Notes (Signed)
Pt in rm 19 for TTS Consult
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
## 2020-11-26 ENCOUNTER — Ambulatory Visit (INDEPENDENT_AMBULATORY_CARE_PROVIDER_SITE_OTHER): Payer: Medicare Other

## 2020-11-26 ENCOUNTER — Other Ambulatory Visit: Payer: Self-pay

## 2020-11-26 DIAGNOSIS — Z5181 Encounter for therapeutic drug level monitoring: Secondary | ICD-10-CM

## 2020-11-26 LAB — POCT INR: INR: 1.8 — AB (ref 2.0–3.0)

## 2020-11-26 NOTE — Patient Instructions (Signed)
Description   - Take 1.5 tablets warfarin tonight and tomorrow, then START taking warfarin 1 tablet every day.  Be consistent with greens.  - Recheck INR in 2 weeks.  Call us with any medication changes or bleeding concerns, [phone], Main [phone].

## 2020-11-29 ENCOUNTER — Telehealth: Payer: Self-pay | Admitting: Licensed Clinical Social Worker
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
## 2020-12-08 ENCOUNTER — Telehealth: Payer: Self-pay | Admitting: *Deleted

## 2020-12-08 NOTE — Telephone Encounter (Signed)
Pt called to let us know that her husband passed away and she had to reschedule her appt. Expressed condolences and she was grateful. Advised to call back if she needed anything and she verbalized understanding.

## 2020-12-16 ENCOUNTER — Other Ambulatory Visit: Payer: Self-pay

## 2020-12-16 ENCOUNTER — Ambulatory Visit (INDEPENDENT_AMBULATORY_CARE_PROVIDER_SITE_OTHER): Payer: Medicare Other | Admitting: *Deleted

## 2020-12-16 DIAGNOSIS — Z5181 Encounter for therapeutic drug level monitoring: Secondary | ICD-10-CM | POA: Diagnosis not present

## 2020-12-16 DIAGNOSIS — I4891 Unspecified atrial fibrillation: Secondary | ICD-10-CM

## 2020-12-16 LAB — POCT INR: INR: 1.7 — AB (ref 2.0–3.0)

## 2020-12-16 NOTE — Patient Instructions (Signed)
Description   Today take 1.5 tablets then start taking warfarin 1 tablet every day except 1.5 tablets on Sundays. Be consistent with greens. Recheck INR in 3 weeks.  Call us with any medication changes or bleeding concerns, [phone], Main [phone].

## 2020-12-22 ENCOUNTER — Encounter (HOSPITAL_COMMUNITY): Payer: Self-pay | Admitting: Radiology

## 2020-12-28 ENCOUNTER — Other Ambulatory Visit: Payer: Self-pay | Admitting: Internal Medicine

## 2020-12-28 DIAGNOSIS — E041 Nontoxic single thyroid nodule: Secondary | ICD-10-CM

## 2021-01-06 ENCOUNTER — Other Ambulatory Visit: Payer: Self-pay

## 2021-01-06 ENCOUNTER — Ambulatory Visit (INDEPENDENT_AMBULATORY_CARE_PROVIDER_SITE_OTHER): Payer: Medicare Other

## 2021-01-06 DIAGNOSIS — I4891 Unspecified atrial fibrillation: Secondary | ICD-10-CM

## 2021-01-06 DIAGNOSIS — Z5181 Encounter for therapeutic drug level monitoring: Secondary | ICD-10-CM

## 2021-01-06 LAB — PROTIME-INR
INR: 6.9 (ref 0.9–1.2)
Prothrombin Time: 64.3 s — ABNORMAL HIGH (ref 9.1–12.0)

## 2021-01-06 LAB — POCT INR: INR: 6.7 — AB (ref 2.0–3.0)

## 2021-01-06 NOTE — Patient Instructions (Signed)
Description   Called and instructed pt to hold Warfarin until Sunday and then resume taking warfarin 1 tablet every day except 1.5 tablets on Sundays. Be consistent with greens. Recheck INR in 1 week.  Call us with any medication changes or bleeding concerns, [phone], Main [phone].

## 2021-01-14 ENCOUNTER — Ambulatory Visit (INDEPENDENT_AMBULATORY_CARE_PROVIDER_SITE_OTHER): Payer: Medicare Other

## 2021-01-14 ENCOUNTER — Other Ambulatory Visit: Payer: Self-pay

## 2021-01-14 DIAGNOSIS — I4891 Unspecified atrial fibrillation: Secondary | ICD-10-CM | POA: Diagnosis not present

## 2021-01-14 DIAGNOSIS — Z5181 Encounter for therapeutic drug level monitoring: Secondary | ICD-10-CM | POA: Diagnosis not present

## 2021-01-14 LAB — POCT INR: INR: 2.2 (ref 2.0–3.0)

## 2021-01-14 NOTE — Patient Instructions (Signed)
-   continue taking warfarin 1 tablet every day except 1.5 tablets on Sundays. Be consistent with greens.  - Recheck INR in 2 weeks Call us with any medication changes or bleeding concerns, [phone], Main [phone].

## 2021-01-27 ENCOUNTER — Ambulatory Visit (INDEPENDENT_AMBULATORY_CARE_PROVIDER_SITE_OTHER): Payer: Medicare Other

## 2021-01-27 ENCOUNTER — Other Ambulatory Visit: Payer: Self-pay

## 2021-01-27 DIAGNOSIS — Z5181 Encounter for therapeutic drug level monitoring: Secondary | ICD-10-CM | POA: Diagnosis not present

## 2021-01-27 DIAGNOSIS — I4891 Unspecified atrial fibrillation: Secondary | ICD-10-CM | POA: Diagnosis not present

## 2021-01-27 LAB — POCT INR: INR: 1.4 — AB (ref 2.0–3.0)

## 2021-01-27 NOTE — Patient Instructions (Signed)
Description   Take 1.5 tablets today and 1.5 tablets tomorrow and then continue taking warfarin 1 tablet every day except 1.5 tablets on Sundays.  Be consistent with greens (2 times per week)  - Recheck INR in 10 days.  Call us with any medication changes or bleeding concerns, [phone], Main [phone].

## 2021-02-07 ENCOUNTER — Ambulatory Visit (INDEPENDENT_AMBULATORY_CARE_PROVIDER_SITE_OTHER): Payer: Medicare Other | Admitting: *Deleted

## 2021-02-07 ENCOUNTER — Other Ambulatory Visit: Payer: Self-pay

## 2021-02-07 DIAGNOSIS — Z5181 Encounter for therapeutic drug level monitoring: Secondary | ICD-10-CM | POA: Diagnosis not present

## 2021-02-07 DIAGNOSIS — I4891 Unspecified atrial fibrillation: Secondary | ICD-10-CM | POA: Diagnosis not present

## 2021-02-07 LAB — POCT INR: INR: 3.2 — AB (ref 2.0–3.0)

## 2021-02-07 NOTE — Patient Instructions (Signed)
Description   Today take 1/2 tablet then continue taking warfarin 1 tablet every day except 1.5 tablets on Sundays. Have some leafy veggies today and remain consistent with greens (2 times per week). Recheck INR in 2 weeks.  Call us with any medication changes or bleeding concerns, [phone], Main [phone].

## 2021-02-21 ENCOUNTER — Other Ambulatory Visit: Payer: Self-pay

## 2021-02-21 ENCOUNTER — Ambulatory Visit (INDEPENDENT_AMBULATORY_CARE_PROVIDER_SITE_OTHER): Payer: Medicare Other | Admitting: *Deleted

## 2021-02-21 DIAGNOSIS — I4891 Unspecified atrial fibrillation: Secondary | ICD-10-CM

## 2021-02-21 DIAGNOSIS — Z5181 Encounter for therapeutic drug level monitoring: Secondary | ICD-10-CM

## 2021-02-21 LAB — POCT INR: INR: 2.6 (ref 2.0–3.0)

## 2021-02-21 NOTE — Patient Instructions (Signed)
Description   Continue taking warfarin 1 tablet every day except 1.5 tablets on Sundays. Have some leafy veggies today and remain consistent with greens (2 times per week). Recheck INR in 4 weeks. Call us with any medication changes or bleeding concerns, [phone], Main [phone].

## 2021-03-17 ENCOUNTER — Other Ambulatory Visit: Payer: Self-pay | Admitting: Interventional Cardiology

## 2021-03-21 ENCOUNTER — Ambulatory Visit (INDEPENDENT_AMBULATORY_CARE_PROVIDER_SITE_OTHER): Payer: Medicare Other | Admitting: *Deleted

## 2021-03-21 ENCOUNTER — Other Ambulatory Visit: Payer: Self-pay

## 2021-03-21 DIAGNOSIS — I4891 Unspecified atrial fibrillation: Secondary | ICD-10-CM | POA: Diagnosis not present

## 2021-03-21 DIAGNOSIS — Z5181 Encounter for therapeutic drug level monitoring: Secondary | ICD-10-CM

## 2021-03-21 LAB — POCT INR: INR: 2.2 (ref 2.0–3.0)

## 2021-03-21 NOTE — Patient Instructions (Signed)
Description   ?Continue taking warfarin 1 tablet every day except 1.5 tablets on Sundays. Remain consistent with green leafy vegetable (2 times per week). Recheck INR in 4 weeks. Call us with any medication changes or bleeding concerns, [phone], Main [phone].  ?  ?  ?

## 2021-04-18 ENCOUNTER — Ambulatory Visit (INDEPENDENT_AMBULATORY_CARE_PROVIDER_SITE_OTHER): Payer: Medicare Other

## 2021-04-18 DIAGNOSIS — I4891 Unspecified atrial fibrillation: Secondary | ICD-10-CM

## 2021-04-18 DIAGNOSIS — Z5181 Encounter for therapeutic drug level monitoring: Secondary | ICD-10-CM

## 2021-04-18 LAB — POCT INR: INR: 1.7 — AB (ref 2.0–3.0)

## 2021-04-18 NOTE — Patient Instructions (Signed)
Description   ?Take 1.5 tablets of Warfarin today, then resume same dosage of Warfarin 1 tablet every day except 1.5 tablets on Sundays. Remain consistent with green leafy vegetable (2 times per week). Recheck INR in 3 weeks. Call us with any medication changes or bleeding concerns, [phone], Main [phone].  ?  ?  ?

## 2021-04-21 ENCOUNTER — Other Ambulatory Visit: Payer: Self-pay | Admitting: Interventional Cardiology

## 2021-04-21 DIAGNOSIS — I482 Chronic atrial fibrillation, unspecified: Secondary | ICD-10-CM

## 2021-04-21 NOTE — Telephone Encounter (Signed)
Prescription refill request received for warfarin ?Lov: 11/19/20 Irish Lack) ?Next INR check: 05/09/21 ?Warfarin tablet strength: '5mg'$  ? ?Appropriate dose and refill sent to requested pharmacy.  ?

## 2021-05-09 ENCOUNTER — Ambulatory Visit (INDEPENDENT_AMBULATORY_CARE_PROVIDER_SITE_OTHER): Payer: Medicare Other | Admitting: *Deleted

## 2021-05-09 DIAGNOSIS — I4891 Unspecified atrial fibrillation: Secondary | ICD-10-CM

## 2021-05-09 DIAGNOSIS — Z5181 Encounter for therapeutic drug level monitoring: Secondary | ICD-10-CM

## 2021-05-09 LAB — POCT INR: INR: 1.4 — AB (ref 2.0–3.0)

## 2021-05-09 NOTE — Patient Instructions (Addendum)
Description   ?Take 1.5 tablets of Warfarin today and 1.5 tablets of warfarin tomorrow, then continue taking Warfarin 1 tablet every day except 1.5 tablets on Sundays. Remain consistent with green leafy vegetable (2 times per week). Recheck INR in 1 week. Call us with any medication changes or bleeding concerns, [phone], Main [phone].  ?  ?  ? ?

## 2021-05-18 ENCOUNTER — Ambulatory Visit (INDEPENDENT_AMBULATORY_CARE_PROVIDER_SITE_OTHER): Payer: Medicare Other | Admitting: *Deleted

## 2021-05-18 DIAGNOSIS — I4891 Unspecified atrial fibrillation: Secondary | ICD-10-CM

## 2021-05-18 DIAGNOSIS — Z5181 Encounter for therapeutic drug level monitoring: Secondary | ICD-10-CM | POA: Diagnosis not present

## 2021-05-18 LAB — POCT INR: INR: 2.2 (ref 2.0–3.0)

## 2021-05-18 NOTE — Patient Instructions (Signed)
Description   ?Continue taking Warfarin 1 tablet every day except 1.5 tablets on Sundays. Remain consistent with green leafy vegetable (2 times per week). Recheck INR in 3 weeks. Call us with any medication changes or bleeding concerns, [phone], Main [phone].  ?  ?  ?

## 2021-06-09 ENCOUNTER — Ambulatory Visit (INDEPENDENT_AMBULATORY_CARE_PROVIDER_SITE_OTHER): Payer: Medicare Other

## 2021-06-09 DIAGNOSIS — Z5181 Encounter for therapeutic drug level monitoring: Secondary | ICD-10-CM | POA: Diagnosis not present

## 2021-06-09 DIAGNOSIS — I4891 Unspecified atrial fibrillation: Secondary | ICD-10-CM

## 2021-06-09 LAB — POCT INR: INR: 3.3 — AB (ref 2.0–3.0)

## 2021-06-09 NOTE — Patient Instructions (Signed)
Description   Skip today's dosage of Warfarin, then start taking 1 tablet every day. (Since started on Methotrexate 3 times weekly (MWF).  Remain consistent with green leafy vegetable (2 times per week). Recheck INR in 2 weeks. Call us with any medication changes or bleeding concerns, [phone], Main [phone].

## 2021-06-22 ENCOUNTER — Ambulatory Visit (INDEPENDENT_AMBULATORY_CARE_PROVIDER_SITE_OTHER): Payer: Medicare Other | Admitting: *Deleted

## 2021-06-22 DIAGNOSIS — Z5181 Encounter for therapeutic drug level monitoring: Secondary | ICD-10-CM | POA: Diagnosis not present

## 2021-06-22 DIAGNOSIS — I4891 Unspecified atrial fibrillation: Secondary | ICD-10-CM

## 2021-06-22 LAB — POCT INR: INR: 1.6 — AB (ref 2.0–3.0)

## 2021-06-22 NOTE — Patient Instructions (Signed)
Description   Today take 1.5 tablets then continue taking 1 tablet every day. (Since started on Methotrexate 3 times weekly (MWF).  Remain consistent with green leafy vegetable (2 times per week). Recheck INR in 2 weeks. Call us with any medication changes or bleeding concerns, [phone], Main [phone].

## 2021-07-06 ENCOUNTER — Ambulatory Visit (INDEPENDENT_AMBULATORY_CARE_PROVIDER_SITE_OTHER): Payer: Medicare Other | Admitting: *Deleted

## 2021-07-06 DIAGNOSIS — I4891 Unspecified atrial fibrillation: Secondary | ICD-10-CM

## 2021-07-06 DIAGNOSIS — Z5181 Encounter for therapeutic drug level monitoring: Secondary | ICD-10-CM

## 2021-07-06 DIAGNOSIS — I482 Chronic atrial fibrillation, unspecified: Secondary | ICD-10-CM

## 2021-07-06 LAB — POCT INR: INR: 1.6 — AB (ref 2.0–3.0)

## 2021-07-06 MED ORDER — WARFARIN SODIUM 5 MG PO TABS: ORAL_TABLET | ORAL | 0 refills | Status: DC

## 2021-07-06 NOTE — Patient Instructions (Signed)
Description   Today take 1.5 tablets then start taking 1 tablet everyday except 1.5 tablets on Sundays. (Since started on Methotrexate 3 times weekly (MWF). Remain consistent with green leafy vegetable (2 times per week). Recheck INR in 2 weeks. Call us with any medication changes or bleeding concerns, [phone], Main [phone].

## 2021-08-08 ENCOUNTER — Ambulatory Visit (INDEPENDENT_AMBULATORY_CARE_PROVIDER_SITE_OTHER): Payer: Medicare Other

## 2021-08-08 DIAGNOSIS — Z5181 Encounter for therapeutic drug level monitoring: Secondary | ICD-10-CM

## 2021-08-08 LAB — POCT INR: INR: 3.2 — AB (ref 2.0–3.0)

## 2021-08-08 NOTE — Patient Instructions (Signed)
CONTINUE taking 1 tablet everyday except 1.5 tablets on Sundays. (Since started on Methotrexate 3 times weekly (MWF). Remain consistent with green leafy vegetable (2 times per week). Recheck INR in 4 weeks. Call us with any medication changes or bleeding concerns, [phone], Main [phone].

## 2021-09-06 ENCOUNTER — Ambulatory Visit: Payer: Medicare Other | Admitting: *Deleted

## 2021-09-06 DIAGNOSIS — Z5181 Encounter for therapeutic drug level monitoring: Secondary | ICD-10-CM | POA: Diagnosis not present

## 2021-09-06 DIAGNOSIS — I4891 Unspecified atrial fibrillation: Secondary | ICD-10-CM | POA: Diagnosis not present

## 2021-09-06 LAB — POCT INR: INR: 1.6 — AB (ref 2.0–3.0)

## 2021-09-06 NOTE — Patient Instructions (Addendum)
Description   Today take 1.5 tablets then continue taking 1 tablet everyday except 1.5 tablets on Sundays. (Since started on Methotrexate 3 times weekly (MWF). Remain consistent with green leafy vegetable (2 times per week). Recheck INR in 3 weeks. Call us with any medication changes or bleeding concerns, [phone], Main [phone].

## 2021-09-14 ENCOUNTER — Ambulatory Visit: Payer: Self-pay | Admitting: Licensed Clinical Social Worker

## 2021-09-14 NOTE — Patient Outreach (Signed)
  Care Coordination   09/14/2021 Name: [name] MRN: [id number] DOB: 1940/07/16   Care Coordination Outreach Attempts:  An unsuccessful telephone outreach was attempted today to offer the patient information about available care coordination services as a benefit of their health plan.   Follow Up Plan:  Additional outreach attempts will be made to offer the patient care coordination information and services.   Encounter Outcome:  No Answer  Care Coordination Interventions Activated:  No   Care Coordination Interventions:  No, not indicated    Casimer Lanius, [place] [phone]

## 2021-09-27 ENCOUNTER — Ambulatory Visit: Payer: Medicare Other | Attending: Interventional Cardiology | Admitting: *Deleted

## 2021-09-27 DIAGNOSIS — I4891 Unspecified atrial fibrillation: Secondary | ICD-10-CM | POA: Insufficient documentation

## 2021-09-27 DIAGNOSIS — Z5181 Encounter for therapeutic drug level monitoring: Secondary | ICD-10-CM | POA: Insufficient documentation

## 2021-09-27 LAB — POCT INR: INR: 1.4 — AB (ref 2.0–3.0)

## 2021-09-27 NOTE — Patient Instructions (Signed)
Description   Today take 2 tablets then start taking 1 tablet everyday except 1.5 tablets on Sundays and Thursdays. Remain consistent with green leafy vegetable (2 times per week). Recheck INR in 1 week. Call us with any medication changes or bleeding concerns, [phone], Main [phone].

## 2021-10-04 ENCOUNTER — Ambulatory Visit: Payer: Medicare Other | Attending: Cardiology | Admitting: *Deleted

## 2021-10-04 DIAGNOSIS — I4891 Unspecified atrial fibrillation: Secondary | ICD-10-CM

## 2021-10-04 DIAGNOSIS — Z5181 Encounter for therapeutic drug level monitoring: Secondary | ICD-10-CM | POA: Diagnosis not present

## 2021-10-04 LAB — POCT INR: INR: 3.9 — AB (ref 2.0–3.0)

## 2021-10-04 NOTE — Patient Instructions (Addendum)
Description   Do not take any Warfarin today then continue taking 1 tablet everyday except 1.5 tablets on Sundays and Thursdays. Remain consistent with green leafy vegetable (2 times per week). Recheck INR in 2 weeks. Call us with any medication changes or bleeding concerns, [phone], Main [phone].

## 2021-10-18 ENCOUNTER — Ambulatory Visit: Payer: Medicare Other | Attending: Interventional Cardiology

## 2021-10-18 DIAGNOSIS — Z5181 Encounter for therapeutic drug level monitoring: Secondary | ICD-10-CM | POA: Diagnosis not present

## 2021-10-18 LAB — POCT INR: INR: 3.9 — AB (ref 2.0–3.0)

## 2021-10-18 NOTE — Patient Instructions (Signed)
HOLD TODAY ONLY and then DECREASE to 1 tablet everyday except 1.5 tablets on Wednesday. Remain consistent with green leafy vegetable (2 times per week). Recheck INR in 2 weeks. Call us with any medication changes or bleeding concerns, [phone], Main [phone].

## 2021-11-01 ENCOUNTER — Ambulatory Visit: Payer: Medicare Other | Attending: Interventional Cardiology | Admitting: *Deleted

## 2021-11-01 DIAGNOSIS — I4891 Unspecified atrial fibrillation: Secondary | ICD-10-CM

## 2021-11-01 DIAGNOSIS — Z5181 Encounter for therapeutic drug level monitoring: Secondary | ICD-10-CM

## 2021-11-01 LAB — POCT INR: INR: 2.7 (ref 2.0–3.0)

## 2021-11-01 NOTE — Patient Instructions (Signed)
Description   Continue taking 1 tablet everyday except 1.5 tablets on Wednesday. Remain consistent with green leafy vegetable (2 times per week). Recheck INR in 3 weeks. Call us with any medication changes or bleeding concerns, [phone], Main [phone].

## 2021-11-16 ENCOUNTER — Other Ambulatory Visit: Payer: Self-pay | Admitting: Interventional Cardiology

## 2021-11-16 DIAGNOSIS — I482 Chronic atrial fibrillation, unspecified: Secondary | ICD-10-CM

## 2021-11-16 NOTE — Telephone Encounter (Signed)
Refill request for warfarin:  Last INR was 2.7 on 11/01/21 Next INR due 11/22/21 LOV was 11/19/20  Lendell Caprice MD  Pt is due to see Dr Irish Lack.  Message sent to scheduler to make appt.  Refill approved x 1.

## 2021-11-17 ENCOUNTER — Other Ambulatory Visit: Payer: Self-pay | Admitting: Interventional Cardiology

## 2021-11-17 DIAGNOSIS — I482 Chronic atrial fibrillation, unspecified: Secondary | ICD-10-CM

## 2021-11-17 NOTE — Telephone Encounter (Signed)
Refill request for warfarin:  Last INR was 2.7 on 11/01/21 Next INR due on 11/22/21 LOV was 11/19/20  Lendell Caprice MD  Refill approved.  Pt due to see Dr Irish Lack.  Message sent to schedulers.

## 2021-11-22 ENCOUNTER — Ambulatory Visit: Payer: Medicare Other | Attending: Interventional Cardiology | Admitting: *Deleted

## 2021-11-22 DIAGNOSIS — I4891 Unspecified atrial fibrillation: Secondary | ICD-10-CM

## 2021-11-22 DIAGNOSIS — Z5181 Encounter for therapeutic drug level monitoring: Secondary | ICD-10-CM

## 2021-11-22 LAB — POCT INR: INR: 1.9 — AB (ref 2.0–3.0)

## 2021-11-22 NOTE — Patient Instructions (Signed)
Description   Today take 1.5 tablets then continue taking 1 tablet everyday except 1.5 tablets on Wednesday. Remain consistent with green leafy vegetable (2 times per week). Recheck INR in 4 weeks. Call us with any medication changes or bleeding concerns, [phone], Main [phone].

## 2021-12-12 NOTE — Progress Notes (Unsigned)
Cardiology Office Note   Date:  12/13/2021   ID:  Fable, Huisman [birth year], MRN [id number]  PCP:  Leeroy Cha, MD    No chief complaint on file.  AFib  Wt Readings from Last 3 Encounters:  12/13/21 138 lb (62.6 kg)  11/22/20 136 lb 11 oz (62 kg)  11/19/20 136 lb 12.8 oz (62.1 kg)       History of Present Illness: [name] is a 81 y.o. female   with a hx of chronic AF, RA, HTN, HL.  She is on Coumadin for anticoagulation.     Cardiac cath in 2012 showed no significant CAD.   She was admitted in 4/17 with chest pain. Echo demonstrated normal LVEF with mod pulmonary HTN (PASP 48 mmHg).  CEs remained neg.  No further ischemic workup was pursued.   She was then readmitted 5/17 with HCAP with assoc moderate pleural effusion.  She underwent left-sided thoracentesis.  Effusion was felt to be exudative and concerning for infectious-empyema. Cytology was negative for malignancy.    She had a pericardial effusion in 11/17.  THis was drained and was bloody.     She is intolerant of diltiazem.      In the past, she has had persistent DOE and occasional palpitations, worse if she walks and more with emotional stress.   In 12/2017. she accidentally took too much verapamil. She was hospitalized for 4 days.   She has had several bouts of pneumonia.   Her nose was cauterized in June 2020.  She has required dental work for absces, reaction to ABx. Also had cataract operations bilaterally. Daughter with colon cancer- has had several surgeries.  Primary caretaker for husband was biggest form of exercise. He passed away in 12/18/22.  Denies : Chest pain. Dizziness. Leg edema. Nitroglycerin use. Orthopnea. Palpitations. Paroxysmal nocturnal dyspnea. Shortness of breath. Syncope.        Past Medical History:  Diagnosis Date   Acne rosacea    Adhesive capsulitis of left shoulder 1980   Allergic rhinitis    Chronic atrial fibrillation ([place])    a. Coumadin  anticoagulation - [facility] ([address])   Chronic bronchitis ([place])    Esophageal reflux    Frequent epistaxis    "cause of my coumadin" (12/16/2015)   Heart murmur    History of echocardiogram    a. Echo 4/17:  EF 55-60%, no RWMA, mild MR, mild LAE, PASP 48 mmHg, trivial effusion post to heart   Hypercholesterolemia    Hyperlipidemia    Hypertension    Osteopenia    Pleural effusion associated with pulmonary infection    Admx 5/17 >> required L thoracentesis (cytology neg for malignancy)    Pneumonia    "I've had it 4 times this year" (12/16/2015)   Polymorphic light eruption 2004   Prolonged QT interval    Pulmonary HTN ([place])    Respiratory failure ([place]) 04/2015   "spent 3 days; went home; then another 13 days in hospital" (12/16/2015   Rheumatoid arthritis ([place]) 1975   Right middle ear infection 1986   Skin cancer    "burndt it off at the right side of my nose/face" (12/16/2015)    Past Surgical History:  Procedure Laterality Date   BREAST BIOPSY Left 2000s X 2   CARDIAC CATHETERIZATION  03/2010   a. Myoview 2/09: no ischemia; low risk  //  b. LHC 3/12: normal coronary arteries   CARDIAC CATHETERIZATION N/A 12/16/2015  Procedure: Pericardiocentesis;  Surgeon: Nelva Bush, MD;  Location: [facility];  Service: Cardiovascular;  Laterality: N/A;   CARDIOVERSION  04/2010   TEMPOROMANDIBULAR JOINT SURGERY Bilateral    TOTAL ABDOMINAL HYSTERECTOMY       Current Outpatient Medications  Medication Sig Dispense Refill   albuterol (PROVENTIL) (2.5 MG/3ML) 0.083% nebulizer solution Take 3 mLs (2.5 mg total) by nebulization every 6 (six) hours as needed for wheezing or shortness of breath. 75 mL 0   alendronate (FOSAMAX) 70 MG tablet Take 70 mg by mouth once a week. mondays     Ascorbic Acid (VITAMIN C) 1000 MG tablet Take 1,000 mg by mouth daily.     cholecalciferol (VITAMIN D) 1000 UNITS tablet Take 2,000 Units by mouth daily.     etanercept (ENBREL) 50 MG/ML  injection Inject 50 mg as directed once a week. Friday     fluticasone (FLONASE) 50 MCG/ACT nasal spray Place 2 sprays into both nostrils daily as needed for rhinitis.      folic acid (FOLVITE) 1 MG tablet Take 2 mg by mouth daily.      furosemide (LASIX) 40 MG tablet TAKE 1 TABLET BY MOUTH EVERY DAY OR AS DIRECTED 90 tablet 2   MAGNESIUM OXIDE PO Take 1 tablet by mouth daily. With Zinc and Calcium     Multiple Vitamins-Minerals (MULTIVITAMIN WITH MINERALS) tablet Take 1 tablet by mouth daily.     potassium chloride (K-DUR,KLOR-CON) 10 MEQ tablet Take 2 tablets (20 mEq total) by mouth daily. 60 tablet 1   pravastatin (PRAVACHOL) 20 MG tablet Take 20 mg by mouth at bedtime.     predniSONE (DELTASONE) 2.5 MG tablet Take 2.5 mg by mouth daily with breakfast.      traMADol (ULTRAM) 50 MG tablet Take 50 mg by mouth as needed for pain.  0   verapamil (CALAN) 120 MG tablet Take 1 tablet (120 mg total) by mouth daily. 90 tablet 3   warfarin (COUMADIN) 5 MG tablet TAKE 1 TABLET BY MOUTH DAILY. EXCEPT 1.5 TABLETS ON WEDNESDAYS OR AS DIRECTED BY COAGULATION CLINIC 100 tablet 0   acetaminophen (TYLENOL) 500 MG tablet Take 1,000 mg by mouth every 6 (six) hours as needed for moderate pain. (Patient not taking: Reported on 12/13/2021)     methotrexate (RHEUMATREX) 2.5 MG tablet Take 2.5 mg by mouth 3 (three) times a week. Taking Mondays, Wednesdays and Fridays (Patient not taking: Reported on 12/13/2021)     metoprolol succinate (TOPROL-XL) 50 MG 24 hr tablet Take 1 tablet (50 mg total) by mouth 2 (two) times daily with a meal. Take with or immediately following a meal. 60 tablet 0   No current facility-administered medications for this visit.    Allergies:   Arthrotec [diclofenac-misoprostol], Cephalexin, Erythromycin, Morphine sulfate, Acetaminophen, Clobetasol propionate, Leflunomide, Other, Amoxicillin, Ciprofloxacin, Diltiazem hcl, Gold bond [menthol-zinc oxide], Gold-containing drug products, Macrodantin  [nitrofurantoin macrocrystal], Morphine, Naproxen, and Penicillins    Social History:  The patient  reports that she has never smoked. She has never used smokeless tobacco. She reports that she does not drink alcohol and does not use drugs.   Family History:  The patient's family history includes Healthy in her sister; Heart disease in her father, mother, sister, and sister; Other in her mother.    ROS:  Please see the history of present illness.   Otherwise, review of systems are positive for .   All other systems are reviewed and negative.    PHYSICAL EXAM: VS:  BP 98/60   Pulse 77   Ht '5\' 3"'$  (1.6 m)   Wt 138 lb (62.6 kg)   SpO2 98%   BMI 24.45 kg/m  , BMI Body mass index is 24.45 kg/m. GEN: Well nourished, well developed, in no acute distress HEENT: normal Neck: no JVD, carotid bruits, or masses Cardiac: irregularly irregular; no murmurs, rubs, or gallops,no edema  Respiratory:  clear to auscultation bilaterally, normal work of breathing GI: soft, nontender, nondistended, + BS MS: no deformity or atrophy Skin: warm and dry, no rash Neuro:  Strength and sensation are intact Psych: euthymic mood, full affect   EKG:   The ekg ordered today demonstrates AFib, rate controlled   Recent Labs: No results found for requested labs within last 365 days.   Lipid Panel    Component Value Date/Time   CHOL 134 05/24/2015 1344   TRIG 148.0 05/21/2013 0846   HDL 36.60 (L) 05/21/2013 0846   CHOLHDL 4 05/21/2013 0846   VLDL 29.6 05/21/2013 0846   LDLCALC 79 05/21/2013 0846     Other studies Reviewed: Additional studies/ records that were reviewed today with results demonstrating: LDL 69 in 2023, Cr 0.94, Hbg 13.3 in 2023.   ASSESSMENT AND PLAN:  Atrial fibrillation: Rate controlled.  Continue verapamil. Anticoagulated: No bleeding problems.  Hypertension: The current medical regimen is effective;  continue present plan and medications. Elevated LFTs: Normal in October 2023.   Off of MTX.   Be careful to avoid falls.    Current medicines are reviewed at length with the patient today.  The patient concerns regarding her medicines were addressed.  The following changes have been made:  No change  Labs/ tests ordered today include:  No orders of the defined types were placed in this encounter.   Recommend 150 minutes/week of aerobic exercise Low fat, low carb, high fiber diet recommended  Disposition:   FU in 1 year   Signed, Larae Grooms, MD  12/13/2021 12:07 PM    [facility] [address] [place], [place], [place]  [address] Phone: [phone]; Fax: [phone]

## 2021-12-13 ENCOUNTER — Ambulatory Visit: Payer: Medicare Other | Attending: Interventional Cardiology | Admitting: Interventional Cardiology

## 2021-12-13 ENCOUNTER — Encounter: Payer: Self-pay | Admitting: Interventional Cardiology

## 2021-12-13 VITALS — BP 98/60 | HR 77 | Ht 63.0 in | Wt 138.0 lb

## 2021-12-13 DIAGNOSIS — I482 Chronic atrial fibrillation, unspecified: Secondary | ICD-10-CM | POA: Diagnosis present

## 2021-12-13 DIAGNOSIS — R7989 Other specified abnormal findings of blood chemistry: Secondary | ICD-10-CM | POA: Diagnosis present

## 2021-12-13 DIAGNOSIS — Z7901 Long term (current) use of anticoagulants: Secondary | ICD-10-CM | POA: Diagnosis not present

## 2021-12-13 DIAGNOSIS — I10 Essential (primary) hypertension: Secondary | ICD-10-CM | POA: Diagnosis not present

## 2021-12-13 NOTE — Patient Instructions (Signed)
Medication Instructions:  Your physician recommends that you continue on your current medications as directed. Please refer to the Current Medication list given to you today.  *If you need a refill on your cardiac medications before your next appointment, please call your pharmacy*   Lab Work: None  If you have labs (blood work) drawn today and your tests are completely normal, you will receive your results only by: [place] (if you have MyChart) OR A paper copy in the mail If you have any lab test that is abnormal or we need to change your treatment, we will call you to review the results.   Testing/Procedures: None  Follow-Up: At [facility], you and your health needs are our priority.  As part of our continuing mission to provide you with exceptional heart care, we have created designated Provider Care Teams.  These Care Teams include your primary Cardiologist (physician) and Advanced Practice Providers (APPs -  Physician Assistants and Nurse Practitioners) who all work together to provide you with the care you need, when you need it.  We recommend signing up for the patient portal called "MyChart".  Sign up information is provided on this After Visit Summary.  MyChart is used to connect with patients for Virtual Visits (Telemedicine).  Patients are able to view lab/test results, encounter notes, upcoming appointments, etc.  Non-urgent messages can be sent to your provider as well.   To learn more about what you can do with MyChart, go to [email or web address].    Your next appointment:   12 month(s)  The format for your next appointment:   In Person  Provider:   Larae Grooms, MD    Important Information About Sugar

## 2021-12-20 ENCOUNTER — Ambulatory Visit: Payer: Medicare Other | Attending: Interventional Cardiology

## 2021-12-20 DIAGNOSIS — Z5181 Encounter for therapeutic drug level monitoring: Secondary | ICD-10-CM

## 2021-12-20 LAB — POCT INR: INR: 1.7 — AB (ref 2.0–3.0)

## 2021-12-20 NOTE — Patient Instructions (Signed)
Today take 2 tablets then continue taking 1 tablet everyday except 1.5 tablets on Wednesday. Remain consistent with green leafy vegetable (2 times per week). Recheck INR in 2 weeks. Call us with any medication changes or bleeding concerns, [phone], Main [phone].

## 2021-12-24 ENCOUNTER — Other Ambulatory Visit: Payer: Self-pay | Admitting: Interventional Cardiology

## 2022-01-03 ENCOUNTER — Ambulatory Visit: Payer: Medicare Other | Attending: Cardiology | Admitting: *Deleted

## 2022-01-03 DIAGNOSIS — Z5181 Encounter for therapeutic drug level monitoring: Secondary | ICD-10-CM | POA: Diagnosis not present

## 2022-01-03 DIAGNOSIS — I4891 Unspecified atrial fibrillation: Secondary | ICD-10-CM

## 2022-01-03 LAB — POCT INR: INR: 1.9 — AB (ref 2.0–3.0)

## 2022-01-03 NOTE — Patient Instructions (Addendum)
Description   Today take 2 tablets then start taking 1 tablet everyday except 1.5 tablets on Sundays and Wednesdays. Remain consistent with green leafy vegetable (2 times per week). Recheck INR in 2 weeks. Call us with any medication changes or bleeding concerns, [phone], Main [phone].

## 2022-01-18 ENCOUNTER — Ambulatory Visit: Payer: Medicare Other | Attending: Interventional Cardiology

## 2022-01-18 DIAGNOSIS — Z5181 Encounter for therapeutic drug level monitoring: Secondary | ICD-10-CM

## 2022-01-18 LAB — POCT INR: INR: 1.7 — AB (ref 2.0–3.0)

## 2022-01-18 NOTE — Patient Instructions (Signed)
Description   Today take 2 tablets then start taking 1 tablet everyday except 1.5 tablets on Sundays, Wednesdays, Fridays.  Remain consistent with green leafy vegetable (2 times per week). Recheck INR in 2 weeks.  Call us with any medication changes or bleeding concerns, [phone]

## 2022-02-01 ENCOUNTER — Ambulatory Visit: Payer: Medicare Other | Attending: Interventional Cardiology

## 2022-02-01 DIAGNOSIS — Z5181 Encounter for therapeutic drug level monitoring: Secondary | ICD-10-CM

## 2022-02-01 LAB — POCT INR: INR: 1.9 — AB (ref 2.0–3.0)

## 2022-02-01 NOTE — Patient Instructions (Addendum)
Description   Today take 2 tablets then START taking 1 tablet everyday except 1.5 tablets on Sundays, Wednesdays, Fridays, and Saturday.  Remain consistent with green leafy vegetable (2 times per week).  Recheck INR in 2 weeks.  Call us with any medication changes or bleeding concerns, [phone]

## 2022-02-15 ENCOUNTER — Ambulatory Visit: Payer: Medicare Other | Attending: Cardiology | Admitting: *Deleted

## 2022-02-15 DIAGNOSIS — I4891 Unspecified atrial fibrillation: Secondary | ICD-10-CM | POA: Diagnosis present

## 2022-02-15 DIAGNOSIS — Z5181 Encounter for therapeutic drug level monitoring: Secondary | ICD-10-CM | POA: Diagnosis present

## 2022-02-15 LAB — POCT INR: INR: 2.8 (ref 2.0–3.0)

## 2022-02-15 NOTE — Patient Instructions (Signed)
Description   Continue taking warfarin 1 tablet everyday except 1.5 tablets on Sundays, Wednesdays, Fridays, and Saturday. Remain consistent with green leafy vegetable (2 times per week).  Recheck INR in 4 weeks. Call us with any medication changes or bleeding concerns, [phone] or [phone]

## 2022-03-01 ENCOUNTER — Ambulatory Visit: Payer: Medicare Other | Admitting: Interventional Cardiology

## 2022-03-15 ENCOUNTER — Ambulatory Visit: Payer: Medicare Other | Attending: Interventional Cardiology | Admitting: *Deleted

## 2022-03-15 DIAGNOSIS — Z5181 Encounter for therapeutic drug level monitoring: Secondary | ICD-10-CM | POA: Diagnosis not present

## 2022-03-15 DIAGNOSIS — I4891 Unspecified atrial fibrillation: Secondary | ICD-10-CM | POA: Diagnosis not present

## 2022-03-15 LAB — POCT INR: INR: 2.4 (ref 2.0–3.0)

## 2022-03-15 NOTE — Patient Instructions (Addendum)
Description   Continue taking warfarin 1 tablet everyday except 1.5 tablets on Sundays, Wednesdays, Fridays, and Saturday. Remain consistent with green leafy vegetable (2 times per week).  Recheck INR in 5 weeks. Call us with any medication changes or bleeding concerns, [phone] or [phone]

## 2022-03-28 ENCOUNTER — Other Ambulatory Visit: Payer: Self-pay | Admitting: Interventional Cardiology

## 2022-03-28 DIAGNOSIS — I482 Chronic atrial fibrillation, unspecified: Secondary | ICD-10-CM

## 2022-03-28 NOTE — Telephone Encounter (Signed)
Warfarin '5mg'$  refill Afib Last INR 03/15/22 Last OV 12/13/21

## 2022-04-15 ENCOUNTER — Emergency Department (HOSPITAL_BASED_OUTPATIENT_CLINIC_OR_DEPARTMENT_OTHER)
Admission: EM | Admit: 2022-04-15 | Discharge: 2022-04-16 | Disposition: A | Discharge status: Home / Self Care | Payer: Medicare Other | Attending: Emergency Medicine | Admitting: Emergency Medicine

## 2022-04-15 DIAGNOSIS — I10 Essential (primary) hypertension: Secondary | ICD-10-CM | POA: Insufficient documentation

## 2022-04-15 DIAGNOSIS — R040 Epistaxis: Secondary | ICD-10-CM | POA: Insufficient documentation

## 2022-04-15 DIAGNOSIS — R791 Abnormal coagulation profile: Secondary | ICD-10-CM | POA: Diagnosis not present

## 2022-04-15 DIAGNOSIS — Z79899 Other long term (current) drug therapy: Secondary | ICD-10-CM | POA: Insufficient documentation

## 2022-04-15 DIAGNOSIS — Z7901 Long term (current) use of anticoagulants: Secondary | ICD-10-CM | POA: Insufficient documentation

## 2022-04-15 NOTE — ED Triage Notes (Signed)
Pt via [facility] for eval of intermittent epistaxis tonight approx 8p. Approx 3 episodes, "when it starts, it starts." Attempted afrin, gauze, pressure, Flonase, etc. No relief. Hx of similar incidents, more frequently over last year- cauterized twice. On coumadin for afib

## 2022-04-16 ENCOUNTER — Encounter (HOSPITAL_BASED_OUTPATIENT_CLINIC_OR_DEPARTMENT_OTHER): Payer: Self-pay

## 2022-04-16 DIAGNOSIS — R040 Epistaxis: Secondary | ICD-10-CM | POA: Diagnosis not present

## 2022-04-16 LAB — PROTIME-INR
INR: 4.6 (ref 0.8–1.2)
Prothrombin Time: 43 seconds — ABNORMAL HIGH (ref 11.4–15.2)

## 2022-04-16 LAB — CBC WITH DIFFERENTIAL/PLATELET
Abs Immature Granulocytes: 0.02 10*3/uL (ref 0.00–0.07)
Basophils Absolute: 0 10*3/uL (ref 0.0–0.1)
Basophils Relative: 0 %
Eosinophils Absolute: 0.2 10*3/uL (ref 0.0–0.5)
Eosinophils Relative: 3 %
HCT: 42.5 % (ref 36.0–46.0)
Hemoglobin: 14.1 g/dL (ref 12.0–15.0)
Immature Granulocytes: 0 %
Lymphocytes Relative: 32 %
Lymphs Abs: 1.8 10*3/uL (ref 0.7–4.0)
MCH: 31.3 pg (ref 26.0–34.0)
MCHC: 33.2 g/dL (ref 30.0–36.0)
MCV: 94.4 fL (ref 80.0–100.0)
Monocytes Absolute: 0.7 10*3/uL (ref 0.1–1.0)
Monocytes Relative: 12 %
Neutro Abs: 2.9 10*3/uL (ref 1.7–7.7)
Neutrophils Relative %: 53 %
Platelets: 167 10*3/uL (ref 150–400)
RBC: 4.5 MIL/uL (ref 3.87–5.11)
RDW: 13.7 % (ref 11.5–15.5)
WBC: 5.6 10*3/uL (ref 4.0–10.5)
nRBC: 0 % (ref 0.0–0.2)

## 2022-04-16 MED ORDER — OXYMETAZOLINE HCL 0.05 % NA SOLN: 2.0000 | Freq: Once | NASAL | Status: DC

## 2022-04-16 NOTE — Discharge Instructions (Signed)
Hold your Coumadin for the next 2 days, then resume as previously prescribed.  Follow-up in the Coumadin clinic on Wednesday as scheduled.  If nosebleed resumes, administer 2 sprays of Afrin and hold pressure for 15 minutes.

## 2022-04-16 NOTE — ED Notes (Signed)
Discussed ordered afrin with EDP [place]. Pt states paramedic administered afrin at around 2300. Not currently having a nose bleed. Delo informed. Per EDD hold afrin and ensure pt has dose for home. Pt updated and states she has afrin at home.

## 2022-04-16 NOTE — ED Provider Notes (Signed)
[facility] Provider Note   CSN: [id number] Arrival date & time: 04/15/22  2355     History  Chief Complaint  Patient presents with   Epistaxis    [name] is a 82 y.o. female.  Patient is an 82 year old female with past medical history of atrial fibrillation on Coumadin, hypertension, hyperlipidemia, rheumatoid arthritis.  Patient presenting today with complaints of nosebleed.  Patient was washing her face this evening when her nose started to bleed from the left nostril.  She had difficulty getting it stopped, so called EMS.  EMS arrived and administered Neo-Synephrine, then patient was transported by private auto here.  By the time she arrived, the bleeding had stopped.  The history is provided by the patient.       Home Medications Prior to Admission medications   Medication Sig Start Date End Date Taking? Authorizing Provider  acetaminophen (TYLENOL) 500 MG tablet Take 1,000 mg by mouth every 6 (six) hours as needed for moderate pain. Patient not taking: Reported on 12/13/2021    [provider]  albuterol (PROVENTIL) (2.5 MG/3ML) 0.083% nebulizer solution Take 3 mLs (2.5 mg total) by nebulization every 6 (six) hours as needed for wheezing or shortness of breath. 03/10/16   Parrett, Fonnie Mu, NP  alendronate (FOSAMAX) 70 MG tablet Take 70 mg by mouth once a week. mondays 10/07/19   [provider]  Ascorbic Acid (VITAMIN C) 1000 MG tablet Take 1,000 mg by mouth daily.    [provider]  cholecalciferol (VITAMIN D) 1000 UNITS tablet Take 2,000 Units by mouth daily.    [provider]  etanercept (ENBREL) 50 MG/ML injection Inject 50 mg as directed once a week. Friday    [provider]  fluticasone (FLONASE) 50 MCG/ACT nasal spray Place 2 sprays into both nostrils daily as needed for rhinitis.     [provider]  folic acid (FOLVITE) 1 MG tablet Take 2 mg by mouth daily.      [provider]  furosemide (LASIX) 40 MG tablet TAKE 1 TABLET BY MOUTH EVERY DAY OR AS DIRECTED 12/26/21   Jettie Booze, MD  MAGNESIUM OXIDE PO Take 1 tablet by mouth daily. With Zinc and Calcium    [provider]  methotrexate (RHEUMATREX) 2.5 MG tablet Take 2.5 mg by mouth 3 (three) times a week. Taking Mondays, Wednesdays and Fridays Patient not taking: Reported on 12/13/2021    [provider]  metoprolol succinate (TOPROL-XL) 50 MG 24 hr tablet Take 1 tablet (50 mg total) by mouth 2 (two) times daily with a meal. Take with or immediately following a meal. 12/30/17 11/22/20  Alma Friendly, MD  Multiple Vitamins-Minerals (MULTIVITAMIN WITH MINERALS) tablet Take 1 tablet by mouth daily.    [provider]  potassium chloride (K-DUR,KLOR-CON) 10 MEQ tablet Take 2 tablets (20 mEq total) by mouth daily. 01/16/16   Barton Dubois, MD  pravastatin (PRAVACHOL) 20 MG tablet Take 20 mg by mouth at bedtime.    [provider]  predniSONE (DELTASONE) 2.5 MG tablet Take 2.5 mg by mouth daily with breakfast.     [provider]  traMADol (ULTRAM) 50 MG tablet Take 50 mg by mouth as needed for pain. 01/29/17   [provider]  verapamil (CALAN) 120 MG tablet Take 1 tablet (120 mg total) by mouth daily. 01/28/18   Jettie Booze, MD  warfarin (COUMADIN) 5 MG tablet TAKE 1 TABLET BY MOUTH DAILY.  EXCEPT 1.5 TABLETS ON WEDNESDAYS OR AS DIRECTED BY COAGULATION CLINIC 03/28/22   Jettie Booze, MD      Allergies    Arthrotec [diclofenac-misoprostol], Cephalexin, Erythromycin, Morphine sulfate, Acetaminophen, Clobetasol propionate, Leflunomide, Other, Amoxicillin, Ciprofloxacin, Diltiazem hcl, Gold bond [menthol-zinc oxide], Gold-containing drug products, Macrodantin [nitrofurantoin macrocrystal], Morphine, Naproxen, and Penicillins    Review of Systems   Review of Systems  All other systems reviewed and are  negative.   Physical Exam Updated Vital Signs BP (!) 124/90   Pulse (!) 56   Temp 98 F (36.7 C) (Axillary)   Resp 16   SpO2 96%  Physical Exam Vitals and nursing note reviewed.  Constitutional:      Appearance: Normal appearance.  HENT:     Head: Normocephalic and atraumatic.     Nose:     Comments: The left nares appears primarily clear.  There is 1 small area where the bleeding may have originated noted, however there is no active bleeding. Pulmonary:     Effort: Pulmonary effort is normal.  Skin:    General: Skin is warm and dry.  Neurological:     Mental Status: She is alert.     ED Results / Procedures / Treatments   Labs (all labs ordered are listed, but only abnormal results are displayed) Labs Reviewed  PROTIME-INR - Abnormal; Notable for the following components:      Result Value   Prothrombin Time 43.0 (*)    INR 4.6 (*)    All other components within normal limits  CBC WITH DIFFERENTIAL/PLATELET    EKG None  Radiology No results found.  Procedures Procedures    Medications Ordered in ED Medications  oxymetazoline (AFRIN) 0.05 % nasal spray 2 spray (2 sprays Each Nare Not Given 04/16/22 0125)    ED Course/ Medical Decision Making/ A&P  Bleeding seems to have resolved with Afrin/Neo-Synephrine and direct pressure.  She has been observed for 2-1/2 hours and has had no further bleeding.  I did obtain laboratory studies including CBC and pro time.  Her INR was 4.6, but laboratory studies otherwise unremarkable.  I have advised patient to hold her Coumadin for the next 2 days, then follow-up in the clinic on Wednesday as scheduled for repeat INR.  To return as needed.  Final Clinical Impression(s) / ED Diagnoses Final diagnoses:  Epistaxis  Supratherapeutic INR    Rx / DC Orders ED Discharge Orders     None         Veryl Speak, MD 04/16/22 [phone]

## 2022-04-19 ENCOUNTER — Ambulatory Visit: Payer: Medicare Other | Attending: Interventional Cardiology | Admitting: *Deleted

## 2022-04-19 DIAGNOSIS — Z5181 Encounter for therapeutic drug level monitoring: Secondary | ICD-10-CM | POA: Diagnosis present

## 2022-04-19 DIAGNOSIS — I4891 Unspecified atrial fibrillation: Secondary | ICD-10-CM

## 2022-04-19 LAB — POCT INR: INR: 2.3 (ref 2.0–3.0)

## 2022-04-19 NOTE — Patient Instructions (Addendum)
  Description   Continue taking warfarin 1 tablet everyday except 1.5 tablets on Sundays, Wednesdays, Fridays, and Saturday. Remain consistent with green leafy vegetable (2 times per week-Tuesday and Thursday). Recheck INR in 2 weeks. Call us with any medication changes or bleeding concerns, [phone] or [phone]

## 2022-05-03 ENCOUNTER — Ambulatory Visit: Payer: Medicare Other | Attending: Interventional Cardiology | Admitting: *Deleted

## 2022-05-03 DIAGNOSIS — I4891 Unspecified atrial fibrillation: Secondary | ICD-10-CM

## 2022-05-03 DIAGNOSIS — Z5181 Encounter for therapeutic drug level monitoring: Secondary | ICD-10-CM | POA: Diagnosis present

## 2022-05-03 LAB — POCT INR: INR: 3.2 — AB (ref 2.0–3.0)

## 2022-05-03 NOTE — Patient Instructions (Signed)
Description   Today take 1 tablet then continue taking warfarin 1 tablet everyday except 1.5 tablets on Sundays, Wednesdays, Fridays, and Saturday. Remain consistent with green leafy vegetable (2 times per week-Tuesday and Thursday). Recheck INR in 2 weeks. Call us with any medication changes or bleeding concerns, [phone] or [phone]

## 2022-05-17 ENCOUNTER — Ambulatory Visit: Payer: Medicare Other | Attending: Interventional Cardiology

## 2022-05-17 DIAGNOSIS — Z5181 Encounter for therapeutic drug level monitoring: Secondary | ICD-10-CM | POA: Insufficient documentation

## 2022-05-17 LAB — POCT INR: INR: 2.8 (ref 2.0–3.0)

## 2022-05-17 NOTE — Patient Instructions (Signed)
Description   Continue taking warfarin 1 tablet everyday except 1.5 tablets on Sundays, Wednesdays, Fridays, and Saturday.  Remain consistent with green leafy vegetable (2 times per week-Tuesday and Thursday).  Recheck INR in 4 weeks.  Call us with any medication changes or bleeding concerns [phone]

## 2022-06-14 ENCOUNTER — Ambulatory Visit: Payer: Medicare Other | Attending: Interventional Cardiology

## 2022-06-14 DIAGNOSIS — Z5181 Encounter for therapeutic drug level monitoring: Secondary | ICD-10-CM | POA: Diagnosis present

## 2022-06-14 LAB — POCT INR: INR: 2.6 (ref 2.0–3.0)

## 2022-06-14 NOTE — Patient Instructions (Signed)
Description   Continue taking warfarin 1 tablet everyday except 1.5 tablets on Sundays, Wednesdays, Fridays, and Saturday.  Remain consistent with green leafy vegetable (2 times per week-Tuesday and Thursday).  Recheck INR in 5 weeks.  Call us with any medication changes or bleeding concerns [phone]

## 2022-07-19 ENCOUNTER — Ambulatory Visit: Payer: Medicare Other | Attending: Cardiovascular Disease | Admitting: *Deleted

## 2022-07-19 DIAGNOSIS — I4891 Unspecified atrial fibrillation: Secondary | ICD-10-CM | POA: Diagnosis not present

## 2022-07-19 DIAGNOSIS — Z5181 Encounter for therapeutic drug level monitoring: Secondary | ICD-10-CM | POA: Diagnosis not present

## 2022-07-19 LAB — POCT INR: INR: 2.1 (ref 2.0–3.0)

## 2022-07-19 NOTE — Patient Instructions (Signed)
Description   Continue taking warfarin 1 tablet everyday except 1.5 tablets on Sundays, Wednesdays, Fridays, and Saturday.  Remain consistent with green leafy vegetable (2 times per week-Tuesday and Thursday).  Recheck INR in 6 weeks.  Call us with any medication changes or bleeding concerns [phone]

## 2022-08-30 ENCOUNTER — Ambulatory Visit: Payer: Medicare Other

## 2022-08-30 DIAGNOSIS — I4891 Unspecified atrial fibrillation: Secondary | ICD-10-CM | POA: Diagnosis not present

## 2022-08-30 DIAGNOSIS — Z5181 Encounter for therapeutic drug level monitoring: Secondary | ICD-10-CM | POA: Diagnosis not present

## 2022-08-30 LAB — POCT INR: INR: 3.1 — AB (ref 2.0–3.0)

## 2022-08-30 NOTE — Patient Instructions (Signed)
Description   Eat an extra serving of greens today and continue taking warfarin 1 tablet everyday except 1.5 tablets on Sundays, Wednesdays, Fridays, and Saturday.  Remain consistent with green leafy vegetable (2 times per week-Tuesday and Thursday).  Recheck INR in 5 weeks.  Call us with any medication changes or bleeding concerns [phone]

## 2022-09-04 ENCOUNTER — Emergency Department (HOSPITAL_BASED_OUTPATIENT_CLINIC_OR_DEPARTMENT_OTHER): Payer: Medicare Other

## 2022-09-04 ENCOUNTER — Emergency Department (HOSPITAL_BASED_OUTPATIENT_CLINIC_OR_DEPARTMENT_OTHER)
Admission: EM | Admit: 2022-09-04 | Discharge: 2022-09-04 | Disposition: A | Discharge status: Home / Self Care | Payer: Medicare Other | Attending: Emergency Medicine | Admitting: Emergency Medicine

## 2022-09-04 ENCOUNTER — Encounter (HOSPITAL_BASED_OUTPATIENT_CLINIC_OR_DEPARTMENT_OTHER): Payer: Self-pay | Admitting: Emergency Medicine

## 2022-09-04 ENCOUNTER — Other Ambulatory Visit: Payer: Self-pay

## 2022-09-04 ENCOUNTER — Emergency Department (HOSPITAL_BASED_OUTPATIENT_CLINIC_OR_DEPARTMENT_OTHER): Payer: Medicare Other | Admitting: Radiology

## 2022-09-04 DIAGNOSIS — I509 Heart failure, unspecified: Secondary | ICD-10-CM | POA: Diagnosis not present

## 2022-09-04 DIAGNOSIS — W19XXXA Unspecified fall, initial encounter: Secondary | ICD-10-CM

## 2022-09-04 DIAGNOSIS — S7002XA Contusion of left hip, initial encounter: Secondary | ICD-10-CM | POA: Insufficient documentation

## 2022-09-04 DIAGNOSIS — S0990XA Unspecified injury of head, initial encounter: Secondary | ICD-10-CM | POA: Diagnosis present

## 2022-09-04 DIAGNOSIS — I4891 Unspecified atrial fibrillation: Secondary | ICD-10-CM | POA: Diagnosis not present

## 2022-09-04 DIAGNOSIS — Y92031 Bathroom in apartment as the place of occurrence of the external cause: Secondary | ICD-10-CM | POA: Insufficient documentation

## 2022-09-04 DIAGNOSIS — S0003XA Contusion of scalp, initial encounter: Secondary | ICD-10-CM | POA: Diagnosis not present

## 2022-09-04 DIAGNOSIS — W01198A Fall on same level from slipping, tripping and stumbling with subsequent striking against other object, initial encounter: Secondary | ICD-10-CM | POA: Insufficient documentation

## 2022-09-04 MED ORDER — ACETAMINOPHEN 500 MG PO TABS
500.0000 mg | ORAL_TABLET | Freq: Once | ORAL | Status: AC
  Administered 2022-09-04: 500 mg via ORAL
  Filled 2022-09-04: qty 1

## 2022-09-04 NOTE — Discharge Instructions (Signed)
Please read and follow all provided instructions.  Your diagnoses today include:  1. Hematoma of scalp, initial encounter   2. Contusion of left hip, initial encounter   3. Fall, initial encounter     Tests performed today include: CT scan of your head that did not show any serious injury. X-ray of your hip did not show any fractures or broken bones Vital signs. See below for your results today.   Medications prescribed:  None  Take any prescribed medications only as directed.  Home care instructions:  Follow any educational materials contained in this packet.  Follow-up instructions: Please follow-up with your primary care provider for further evaluation of your symptoms as needed.   Return instructions:  SEEK IMMEDIATE MEDICAL ATTENTION IF: There is confusion or drowsiness (although children frequently become drowsy after injury).  You cannot awaken the injured person.  You have more than one episode of vomiting.  You notice dizziness or unsteadiness which is getting worse, or inability to walk.  You have convulsions or unconsciousness.  You experience severe, persistent headaches not relieved by Tylenol. You cannot use arms or legs normally.  There are changes in pupil sizes. (This is the black center in the colored part of the eye)  There is clear or bloody discharge from the nose or ears.  You have change in speech, vision, swallowing, or understanding.  Localized weakness, numbness, tingling, or change in bowel or bladder control. You have any other emergent concerns.  Additional Information: You have had a head injury which does not appear to require admission at this time.  Your vital signs today were: BP 115/73   Pulse 81   Temp (!) 97.5 F (36.4 C) (Temporal)   Resp 18   Ht 5\' 3"  (1.6 m)   Wt 61.7 kg   SpO2 96%   BMI 24.09 kg/m  If your blood pressure (BP) was elevated above 135/85 this visit, please have this repeated by your doctor within one  month. --------------

## 2022-09-04 NOTE — ED Provider Notes (Signed)
[facility] EMERGENCY DEPARTMENT AT [facility] Provider Note   CSN: [id number] Arrival date & time: 09/04/22  4098     History  Chief Complaint  Patient presents with   [name]    [name] is a 82 y.o. female.  Patient with history of atrial fibrillation on chronic Coumadin, history of heart failure, pleural effusion, osteoporosis--presents to the emergency department today after a fall.  Patient got up around 7 AM today and walked to her bathroom when she was tripped by her dog.  Patient fell backwards and struck the top of her head on the dog's cage.  She did not lose consciousness.  She was attended to fairly quickly by family.  Patient was awake and talkative.  She currently complains of pain in the top of her head and left hip.  She also has some generalized abdominal discomfort.  No subsequent vomiting.  She did have a couple of bowel movements this morning without blood.  She does admit to eating a lot of greasy food yesterday.  Family at bedside reports that she is currently at her baseline.       Home Medications Prior to Admission medications   Medication Sig Start Date End Date Taking? Authorizing Provider  acetaminophen (TYLENOL) 500 MG tablet Take 1,000 mg by mouth every 6 (six) hours as needed for moderate pain. Patient not taking: Reported on 12/13/2021    [provider]  albuterol (PROVENTIL) (2.5 MG/3ML) 0.083% nebulizer solution Take 3 mLs (2.5 mg total) by nebulization every 6 (six) hours as needed for wheezing or shortness of breath. 03/10/16   Parrett, Virgel Bouquet, NP  alendronate (FOSAMAX) 70 MG tablet Take 70 mg by mouth once a week. mondays 10/07/19   [provider]  Ascorbic Acid (VITAMIN C) 1000 MG tablet Take 1,000 mg by mouth daily.    [provider]  cholecalciferol (VITAMIN D) 1000 UNITS tablet Take 2,000 Units by mouth daily.    [provider]  etanercept (ENBREL) 50 MG/ML injection Inject 50 mg as  directed once a week. Friday    [provider]  fluticasone (FLONASE) 50 MCG/ACT nasal spray Place 2 sprays into both nostrils daily as needed for rhinitis.     [provider]  folic acid (FOLVITE) 1 MG tablet Take 2 mg by mouth daily.     [provider]  furosemide (LASIX) 40 MG tablet TAKE 1 TABLET BY MOUTH EVERY DAY OR AS DIRECTED 12/26/21   Corky Crafts, MD  MAGNESIUM OXIDE PO Take 1 tablet by mouth daily. With Zinc and Calcium    [provider]  methotrexate (RHEUMATREX) 2.5 MG tablet Take 2.5 mg by mouth 3 (three) times a week. Taking Mondays, Wednesdays and Fridays Patient not taking: Reported on 12/13/2021    [provider]  metoprolol succinate (TOPROL-XL) 50 MG 24 hr tablet Take 1 tablet (50 mg total) by mouth 2 (two) times daily with a meal. Take with or immediately following a meal. 12/30/17 11/22/20  Briant Cedar, MD  Multiple Vitamins-Minerals (MULTIVITAMIN WITH MINERALS) tablet Take 1 tablet by mouth daily.    [provider]  potassium chloride (K-DUR,KLOR-CON) 10 MEQ tablet Take 2 tablets (20 mEq total) by mouth daily. 01/16/16   Vassie Loll, MD  pravastatin (PRAVACHOL) 20 MG tablet Take 20 mg by mouth at bedtime.    [provider]  predniSONE (DELTASONE) 2.5 MG tablet Take 2.5 mg by mouth daily with breakfast.  [provider]  traMADol (ULTRAM) 50 MG tablet Take 50 mg by mouth as needed for pain. 01/29/17   [provider]  verapamil (CALAN) 120 MG tablet Take 1 tablet (120 mg total) by mouth daily. 01/28/18   Corky Crafts, MD  warfarin (COUMADIN) 5 MG tablet TAKE 1 TABLET BY MOUTH DAILY. EXCEPT 1.5 TABLETS ON WEDNESDAYS OR AS DIRECTED BY COAGULATION CLINIC 03/28/22   Corky Crafts, MD      Allergies    Arthrotec [diclofenac-misoprostol], Cephalexin, Erythromycin, Morphine sulfate, Acetaminophen, Clobetasol propionate, Leflunomide, Other, Amoxicillin,  Ciprofloxacin, Diltiazem hcl, Gold bond [menthol-zinc oxide], Gold-containing drug products, Macrodantin [nitrofurantoin macrocrystal], Morphine, Naproxen, and Penicillins    Review of Systems   Review of Systems  Physical Exam Updated Vital Signs BP (!) 145/88 (BP Location: Left Arm)   Pulse 62   Temp (!) 97.5 F (36.4 C) (Temporal)   Resp 18   Ht 5\' 3"  (1.6 m)   Wt 61.7 kg   SpO2 96%   BMI 24.09 kg/m  Physical Exam Vitals and nursing note reviewed.  Constitutional:      Appearance: She is well-developed.  HENT:     Head: Normocephalic. No raccoon eyes or Battle's sign.     Comments: There is a hematoma noted on the vertex of the scalp without laceration or bleeding.    Right Ear: Tympanic membrane, ear canal and external ear normal. No hemotympanum.     Left Ear: Tympanic membrane, ear canal and external ear normal. No hemotympanum.     Nose: Nose normal.     Mouth/Throat:     Mouth: Mucous membranes are moist.     Pharynx: Uvula midline.  Eyes:     General: Lids are normal.     Extraocular Movements:     Right eye: No nystagmus.     Left eye: No nystagmus.     Conjunctiva/sclera: Conjunctivae normal.     Pupils: Pupils are equal, round, and reactive to light.     Comments: No visible hyphema noted  Cardiovascular:     Rate and Rhythm: Normal rate and regular rhythm.  Pulmonary:     Effort: Pulmonary effort is normal.     Breath sounds: Normal breath sounds.  Abdominal:     Palpations: Abdomen is soft.     Tenderness: There is no abdominal tenderness. There is no guarding or rebound.     Comments: Patient without any tenderness to palpation of the abdomen.  She reports pain, but not made worse with palpation oir movement.  She does not appear uncomfortable.  Musculoskeletal:     Right shoulder: No tenderness or bony tenderness. Normal range of motion.     Left shoulder: No tenderness or bony tenderness. Normal range of motion.     Cervical back: Normal range of  motion and neck supple. No tenderness or bony tenderness.     Thoracic back: No tenderness or bony tenderness.     Lumbar back: No tenderness or bony tenderness.     Right hip: No tenderness or bony tenderness. Normal range of motion.     Left hip: Tenderness present. No bony tenderness. Normal range of motion.     Right knee: Normal range of motion. No tenderness.     Left knee: Normal range of motion. No tenderness.  Skin:    General: Skin is warm and dry.  Neurological:     Mental Status: She is alert and oriented to person, place, and time.  GCS: GCS eye subscore is 4. GCS verbal subscore is 5. GCS motor subscore is 6.     Cranial Nerves: No cranial nerve deficit.     Sensory: No sensory deficit.     Coordination: Coordination normal.     ED Results / Procedures / Treatments   Labs (all labs ordered are listed, but only abnormal results are displayed) Labs Reviewed - No data to display  EKG EKG Interpretation Date/Time:  Monday September 04 2022 09:34:24 EDT Ventricular Rate:  90 PR Interval:    QRS Duration:  88 QT Interval:  402 QTC Calculation: 492 R Axis:   199  Text Interpretation: Right and left arm electrode reversal, interpretation assumes no reversal Atrial fibrillation Paired ventricular premature complexes Probable lateral infarct, age indeterminate when compared to prior, similar afib. No STEMI Confirmed by Theda Belfast ([id number]) on 09/04/2022 9:50:31 AM  Radiology No results found.  Procedures Procedures    Medications Ordered in ED Medications  acetaminophen (TYLENOL) tablet 500 mg (500 mg Oral Given 09/04/22 1042)    ED Course/ Medical Decision Making/ A&P    Patient seen and examined. History obtained directly from patient and family at bedside.  Labs/EKG: None ordered  Imaging: CT of the head as well as left hip film ordered in triage  Medications/Fluids: Ordered: Oral Tylenol, patient reports that chronic Tylenol use elevates her liver  enzymes which she is willing to take 500 mg acetaminophen here for pain.  Most recent vital signs reviewed and are as follows: BP (!) 145/88 (BP Location: Left Arm)   Pulse 62   Temp (!) 97.5 F (36.4 C) (Temporal)   Resp 18   Ht 5\' 3"  (1.6 m)   Wt 61.7 kg   SpO2 96%   BMI 24.09 kg/m   Initial impression: Mechanical fall over a pet, scalp contusion, left hip contusion, abdominal pain with reassuring exam.  At this time her abdominal exam is benign.  Will reassess to ensure no worsening in setting of anticoagulation.    Reassessment performed. Patient appears stable.  Imaging personally visualized and interpreted including: CT head agree negative; x-ray of the hip, agree negative.  Reviewed pertinent lab work and imaging with patient at bedside. Questions answered.   Most current vital signs reviewed and are as follows: BP 115/73   Pulse 81   Temp (!) 97.5 F (36.4 C) (Temporal)   Resp 18   Ht 5\' 3"  (1.6 m)   Wt 61.7 kg   SpO2 96%   BMI 24.09 kg/m   Plan: Discharge to home.   Prescriptions written for: None  Other home care instructions discussed: Rest protocol, OTC meds for pain  ED return instructions discussed: New or worsening symptoms. Patient was counseled on head injury precautions and symptoms that should indicate their return to the ED.  These include severe worsening headache, vision changes, confusion, loss of consciousness, trouble walking, nausea & vomiting, or weakness/tingling in extremities.    Follow-up instructions discussed: Patient encouraged to follow-up with their PCP in 3-5 days.                                 Medical Decision Making Amount and/or Complexity of Data Reviewed Radiology: ordered.  Risk OTC drugs.   Patient presents after mechanical fall today.  She is on anticoagulation.  Head CT negative.  Also complains of hip pain but has good range of motion.  She is ambulatory.  X-ray was negative.  She has chronic back pain, low  concern overall for compression fracture at this time.  No neurologic deficits.  She will be discharged home with supportive care, in care of family.        Final Clinical Impression(s) / ED Diagnoses Final diagnoses:  Hematoma of scalp, initial encounter  Contusion of left hip, initial encounter  Fall, initial encounter    Rx / DC Orders ED Discharge Orders     None         Renne Crigler, Cordelia Poche 09/04/22 1916    Tegeler, Canary Brim, MD 09/05/22 [phone]

## 2022-09-04 NOTE — ED Notes (Signed)
PT ambulated with steady gait and no assistance.

## 2022-09-04 NOTE — ED Triage Notes (Signed)
Pt arrives to ED with c/o fall. Pt notes she had a "upset stomach" this morning and tripped and fell and hit her head on the dog cage. Pt notes she is on Warfarin for Afib. Pt denies LOC.

## 2022-09-08 ENCOUNTER — Telehealth: Payer: Self-pay

## 2022-09-08 NOTE — Telephone Encounter (Signed)
Received call from pt stating she fell on Monday, 09/04/22. Since she hit her head, she went to ED. Pt wanted to call and make anticoagulation clinic aware and also confirm she should continue her Warfarin. Pt states she was instructed by ED Dr she should continue but she also wanted to confirm with our clinic. I advised her to adhere to ED recommendations and if she experiences any new signs or symptoms of bleeding to seek immediate medical attention.

## 2022-10-06 ENCOUNTER — Ambulatory Visit: Payer: Medicare Other | Attending: Cardiovascular Disease

## 2022-10-06 DIAGNOSIS — Z5181 Encounter for therapeutic drug level monitoring: Secondary | ICD-10-CM | POA: Insufficient documentation

## 2022-10-06 LAB — POCT INR: INR: 2.6 (ref 2.0–3.0)

## 2022-10-06 NOTE — Patient Instructions (Signed)
continue taking warfarin 1 tablet everyday except 1.5 tablets on Sundays, Wednesdays, Fridays, and Saturday.  Remain consistent with green leafy vegetable (2 times per week-Tuesday and Thursday).  Recheck INR in 6 weeks.  Call us with any medication changes or bleeding concerns [phone]

## 2022-11-17 ENCOUNTER — Ambulatory Visit: Payer: Medicare Other | Attending: Interventional Cardiology

## 2022-11-17 DIAGNOSIS — Z5181 Encounter for therapeutic drug level monitoring: Secondary | ICD-10-CM | POA: Diagnosis not present

## 2022-11-17 DIAGNOSIS — I482 Chronic atrial fibrillation, unspecified: Secondary | ICD-10-CM

## 2022-11-17 LAB — PROTIME-INR
INR: 7.6 (ref 0.9–1.2)
Prothrombin Time: 73.9 s — ABNORMAL HIGH (ref 9.1–12.0)

## 2022-11-17 NOTE — Patient Instructions (Signed)
Description   POC INR >8.0. STAT lab results 7.6. Called pt and instructed to HOLD Warfarin today, tomorrow, Sunday, and Monday.  Recheck INR on Tuesday.  Normal dose: 1 tablet everyday except 1.5 tablets on Sundays, Wednesdays, Fridays, and Saturday.  Remain consistent with green leafy vegetable (2 times per week-Tuesday and Thursday).  Recheck INR on Tuesday, 11/21/22.  Call us with any medication changes or bleeding concerns [phone]

## 2022-11-20 ENCOUNTER — Other Ambulatory Visit: Payer: Self-pay

## 2022-11-20 DIAGNOSIS — I482 Chronic atrial fibrillation, unspecified: Secondary | ICD-10-CM

## 2022-11-20 MED ORDER — WARFARIN SODIUM 5 MG PO TABS: ORAL_TABLET | ORAL | 1 refills | Status: DC

## 2022-11-21 ENCOUNTER — Ambulatory Visit: Payer: Medicare Other | Attending: Interventional Cardiology | Admitting: *Deleted

## 2022-11-21 DIAGNOSIS — Z5181 Encounter for therapeutic drug level monitoring: Secondary | ICD-10-CM | POA: Diagnosis present

## 2022-11-21 DIAGNOSIS — I4891 Unspecified atrial fibrillation: Secondary | ICD-10-CM | POA: Diagnosis not present

## 2022-11-21 LAB — POCT INR: INR: 1.5 — AB (ref 2.0–3.0)

## 2022-11-21 NOTE — Patient Instructions (Signed)
Description   Today take 1.5 tablets of warfarin and 2 tablets of warfarin then continue taking 1 tablet everyday except 1.5 tablets on Sundays, Wednesdays, Fridays, and Saturday.  Remain consistent with green leafy vegetable (2 times per week-Tuesday and Thursday).  Recheck INR in 1 week. Call us with any medication changes or bleeding concerns [phone]

## 2022-11-28 ENCOUNTER — Ambulatory Visit: Payer: Medicare Other | Attending: Cardiology

## 2022-11-28 DIAGNOSIS — I4891 Unspecified atrial fibrillation: Secondary | ICD-10-CM | POA: Diagnosis present

## 2022-11-28 DIAGNOSIS — Z5181 Encounter for therapeutic drug level monitoring: Secondary | ICD-10-CM | POA: Diagnosis not present

## 2022-11-28 LAB — POCT INR: INR: 5 — AB (ref 2.0–3.0)

## 2022-11-28 NOTE — Patient Instructions (Signed)
HOLD TODAY and Wednesday then continue taking 1 tablet everyday except 1.5 tablets on Sundays, Wednesdays, Fridays, and Saturday.  Remain consistent with green leafy vegetable (2 times per week-Tuesday and Thursday).  Recheck INR in 2 week. Call us with any medication changes or bleeding concerns [phone]

## 2022-12-12 ENCOUNTER — Ambulatory Visit: Payer: Medicare Other | Attending: Internal Medicine | Admitting: *Deleted

## 2022-12-12 ENCOUNTER — Telehealth: Payer: Self-pay | Admitting: Physician Assistant

## 2022-12-12 DIAGNOSIS — I4891 Unspecified atrial fibrillation: Secondary | ICD-10-CM | POA: Insufficient documentation

## 2022-12-12 DIAGNOSIS — Z5181 Encounter for therapeutic drug level monitoring: Secondary | ICD-10-CM | POA: Diagnosis not present

## 2022-12-12 LAB — POCT INR: INR: 8 — AB (ref 2.0–3.0)

## 2022-12-12 NOTE — Telephone Encounter (Signed)
Received critical INR level of 8.2, it was 8.0 earlier in coumadin clinic. Attempted to call patient, no answer. Coumadin clinic already informed patient to hold coumadin tonight and go to ED if there is any bleeding.

## 2022-12-13 LAB — PROTIME-INR
INR: 8.2 (ref 0.9–1.2)
Prothrombin Time: 78.2 s — ABNORMAL HIGH (ref 9.1–12.0)

## 2022-12-13 NOTE — Patient Instructions (Signed)
Description   Do not take any warfarin today, no warfarin tomorrow, no warfarin Thursday then START taking 1 tablet daily except 1.5 tablets on Wednesdays.  12/13/22 at 856am and spoke with pt advised not to take any warfarin 11/26, 11/27, 11/28 then START taking warfarin 1 tablet daily except 1.5 tablets on Wednesdays.  REPORT TO ER WITH ANY BLEEDING, FALLS, ACCIDENTS. PT AWARE IF INR IS NOT BACK BY 5PM THAT I WILL CALL HER IN THE AM WITH RESULTS.  Remain consistent with green leafy vegetable (2 times per week-Tuesday and Thursday).  Recheck INR in 1 week Call us with any medication changes or bleeding concerns [phone]

## 2022-12-17 NOTE — Progress Notes (Unsigned)
Cardiology Office Note:  .   Date:  12/18/2022  ID:  [name], DOB 02-05-1940, MRN [id number] PCP: Lorenda Ishihara, MD  [facility] Providers Cardiologist:  Reatha Harps, MD { History of Present Illness: .   [name] is a 82 y.o. female with history of permanent Afib, HTN, HLD who presents for follow-up.    History of Present Illness   The patient, an 82 year old with a history of permanent atrial fibrillation, hypertension, hyperlipidemia, and pericardial effusion, presents for a follow-up visit. She is currently on verapamil and metoprolol for heart rate control, and Coumadin for anticoagulation. She has been doing well on Coumadin, but recently had an elevated INR due to an antibiotic. She also has a history of diastolic heart failure, which is controlled with Lasix. She reports feeling her heart race when doing strenuous activities, such as going up a hill, but not during routine activities around the house. She also reports shortness of breath, which she attributes to a recent cold. She has a history of rheumatoid arthritis and osteoporosis. She has never had a heart attack or stroke. She has a family history of heart disease. She does not smoke or drink alcohol. She is widowed and has two children, four grandchildren, and four great-grandchildren. She worked as a Engineer, civil (consulting) for many years.          Problem List Permanent Afib HTN HLD -T chol 161, HDL 48, LDL 81, TG 194 RA HFpEF Pericardial effusion s/p pericardiocentesis (2017)    ROS: All other ROS reviewed and negative. Pertinent positives noted in the HPI.     Studies Reviewed: Marland Kitchen   EKG Interpretation Date/Time:  Monday December 18 2022 15:02:21 EST Ventricular Rate:  121 PR Interval:    QRS Duration:  70 QT Interval:  284 QTC Calculation: 403 R Axis:   -17  Text Interpretation: Atrial fibrillation with rapid ventricular response Nonspecific ST and T wave abnormality Confirmed by Lennie Odor [id number]) on 12/18/2022 3:04:57 PM    TTE 02/15/2016 - Left ventricle: The cavity size was normal. Systolic function was    normal. The estimated ejection fraction was in the range of 55%    to 60%. Wall motion was normal; there were no regional wall    motion abnormalities.  - Aortic valve: Transvalvular velocity was within the normal range.    There was no stenosis. There was no regurgitation.  - Mitral valve: Transvalvular velocity was within the normal range.    There was no evidence for stenosis. There was mild regurgitation.  - Right ventricle: The cavity size was normal. Wall thickness was    normal. Systolic function was normal.  - Tricuspid valve: There was mild regurgitation.  - Pulmonary arteries: Systolic pressure was within the normal    range. PA peak pressure: 23 mm Hg (S).  Physical Exam:   VS:  BP 136/70 (BP Location: Right Arm, Patient Position: Sitting, Cuff Size: Normal)   Pulse (!) 52   Ht 5\' 3"  (1.6 m)   Wt 135 lb (61.2 kg)   SpO2 96%   BMI 23.91 kg/m    Wt Readings from Last 3 Encounters:  12/18/22 135 lb (61.2 kg)  09/04/22 136 lb (61.7 kg)  12/13/21 138 lb (62.6 kg)    GEN: Well nourished, well developed in no acute distress NECK: No JVD; No carotid bruits CARDIAC: irregular rhythm, no murmurs, rubs, gallops RESPIRATORY:  Clear to auscultation without rales, wheezing or rhonchi  ABDOMEN: Soft,  non-tender, non-distended EXTREMITIES:  No edema; No deformity  ASSESSMENT AND PLAN: .   Assessment and Plan    Atrial Fibrillation Permanent atrial fibrillation, currently on Metoprolol Succinate 50mg  BID and Verapamil 120mg  daily. Heart rate 90 bpm on recheck. Shortness of breath with activity noted. -Refill Metoprolol prescription. -Repeat echocardiogram to assess current cardiac status. -Continue current medication regimen and follow up in 1 year or sooner if needed.  Anticoagulation On Coumadin with recent elevated INR (4.1) due to antibiotic use.  Patient prefers to continue Coumadin due to cost concerns with alternatives (Eliquis, Xarelto). -Hold Coumadin as per Coumadin clinic's instructions. -Follow up with Coumadin clinic for further management.  Diastolic Heart Failure Controlled on Lasix 40mg  daily. No significant symptoms reported. -Continue Lasix 40mg  daily.  Hyperlipidemia Well controlled on Pravastatin, most recent LDL 81. -Continue Pravastatin.  General Health Maintenance -Schedule follow-up appointment in 1 year or sooner if needed.              Follow-up: Return in about 1 year (around 12/18/2023).  Time Spent with Patient: I have spent a total of 35 minutes caring for this patient today face to face, ordering and reviewing labs/tests, reviewing prior records/medical history, examining the patient, establishing an assessment and plan, communicating results/findings to the patient/family, and documenting in the medical record.   Signed, Lenna Gilford. Flora Lipps, MD, [facility]  [facility]  [address], [address] [place], [place] [address] [phone]  5:18 PM

## 2022-12-18 ENCOUNTER — Encounter: Payer: Self-pay | Admitting: Cardiovascular Disease

## 2022-12-18 ENCOUNTER — Ambulatory Visit: Payer: Medicare Other | Attending: Cardiovascular Disease | Admitting: Cardiovascular Disease

## 2022-12-18 ENCOUNTER — Ambulatory Visit (INDEPENDENT_AMBULATORY_CARE_PROVIDER_SITE_OTHER): Payer: Medicare Other

## 2022-12-18 VITALS — BP 136/70 | HR 52 | Ht 63.0 in | Wt 135.0 lb

## 2022-12-18 DIAGNOSIS — I4821 Permanent atrial fibrillation: Secondary | ICD-10-CM | POA: Insufficient documentation

## 2022-12-18 DIAGNOSIS — I10 Essential (primary) hypertension: Secondary | ICD-10-CM | POA: Diagnosis not present

## 2022-12-18 DIAGNOSIS — I4891 Unspecified atrial fibrillation: Secondary | ICD-10-CM

## 2022-12-18 DIAGNOSIS — Z5181 Encounter for therapeutic drug level monitoring: Secondary | ICD-10-CM | POA: Diagnosis present

## 2022-12-18 DIAGNOSIS — I5032 Chronic diastolic (congestive) heart failure: Secondary | ICD-10-CM | POA: Diagnosis present

## 2022-12-18 LAB — POCT INR: INR: 4.1 — AB (ref 2.0–3.0)

## 2022-12-18 MED ORDER — METOPROLOL SUCCINATE ER 50 MG PO TB24: 50.0000 mg | ORAL_TABLET | Freq: Two times a day (BID) | ORAL | 0 refills | Status: DC

## 2022-12-18 NOTE — Patient Instructions (Addendum)
Medication Instructions:   Refilled metoprolol today    *If you need a refill on your cardiac medications before your next appointment, please call your pharmacy*   Lab Work: None    If you have labs (blood work) drawn today and your tests are completely normal, you will receive your results only by: MyChart Message (if you have MyChart) OR A paper copy in the mail If you have any lab test that is abnormal or we need to change your treatment, we will call you to review the results.   Testing/Procedures: Echo will be scheduled at [address].  Your physician has requested that you have an echocardiogram. Echocardiography is a painless test that uses sound waves to create images of your heart. It provides your doctor with information about the size and shape of your heart and how well your heart's chambers and valves are working. This procedure takes approximately one hour. There are no restrictions for this procedure. Please do NOT wear cologne, perfume, aftershave, or lotions (deodorant is allowed). Please arrive 15 minutes prior to your appointment time.    Follow-Up: At [facility], you and your health needs are our priority.  As part of our continuing mission to provide you with exceptional heart care, we have created designated Provider Care Teams.  These Care Teams include your primary Cardiologist (physician) and Advanced Practice Providers (APPs -  Physician Assistants and Nurse Practitioners) who all work together to provide you with the care you need, when you need it.  We recommend signing up for the patient portal called "MyChart".  Sign up information is provided on this After Visit Summary.  MyChart is used to connect with patients for Virtual Visits (Telemedicine).  Patients are able to view lab/test results, encounter notes, upcoming appointments, etc.  Non-urgent messages can be sent to your provider as well.   To learn more about what you can do with  MyChart, go to [email or web address].    Your next appointment:   1 year(s)  The format for your next appointment:   In Person  Provider:   Reatha Harps, MD    Other Instructions

## 2022-12-18 NOTE — Patient Instructions (Signed)
Description   Do not take any warfarin today and then START taking 1 tablet daily  Remain consistent with green leafy vegetable (2 times per week-Tuesday and Thursday).  Recheck INR in 1 week Call us with any medication changes or bleeding concerns [phone]

## 2022-12-19 ENCOUNTER — Ambulatory Visit (HOSPITAL_COMMUNITY)
Admission: RE | Admit: 2022-12-19 | Discharge: 2022-12-19 | Disposition: A | Discharge status: Home / Self Care | Payer: Medicare Other | Source: Ambulatory Visit | Attending: Cardiology | Admitting: Cardiology

## 2022-12-19 DIAGNOSIS — I4821 Permanent atrial fibrillation: Secondary | ICD-10-CM | POA: Diagnosis present

## 2022-12-19 LAB — ECHOCARDIOGRAM COMPLETE
AR max vel: 0.96 cm2
AV Area VTI: 0.86 cm2
AV Area mean vel: 0.96 cm2
AV Mean grad: 5 mm[Hg]
AV Peak grad: 9.1 mm[Hg]
AV Vena cont: 0.23 cm
Ao pk vel: 1.51 m/s
P 1/2 time: 560 ms
S' Lateral: 2.54 cm

## 2022-12-27 ENCOUNTER — Ambulatory Visit: Payer: Medicare Other | Attending: Cardiology | Admitting: *Deleted

## 2022-12-27 DIAGNOSIS — I4891 Unspecified atrial fibrillation: Secondary | ICD-10-CM | POA: Diagnosis not present

## 2022-12-27 DIAGNOSIS — Z5181 Encounter for therapeutic drug level monitoring: Secondary | ICD-10-CM | POA: Insufficient documentation

## 2022-12-27 LAB — POCT INR: INR: 3.4 — AB (ref 2.0–3.0)

## 2022-12-27 NOTE — Patient Instructions (Signed)
Description   Do not take any warfarin today and then START taking 1 tablet daily except 1/2 tablet on Sundays. Remain consistent with green leafy vegetable (2 times per week-Tuesday and Thursday). Recheck INR in 1.5 weeks. Call us with any medication changes or bleeding concerns [phone]

## 2023-01-08 ENCOUNTER — Ambulatory Visit: Payer: Medicare Other | Attending: Cardiology | Admitting: *Deleted

## 2023-01-08 DIAGNOSIS — Z5181 Encounter for therapeutic drug level monitoring: Secondary | ICD-10-CM | POA: Diagnosis present

## 2023-01-08 DIAGNOSIS — I4891 Unspecified atrial fibrillation: Secondary | ICD-10-CM | POA: Diagnosis present

## 2023-01-08 LAB — POCT INR: INR: 2.7 (ref 2.0–3.0)

## 2023-01-08 NOTE — Patient Instructions (Signed)
Description   Continue taking 1 tablet daily except 1/2 tablet on Sundays. Remain consistent with green leafy vegetable (2 times per week-Tuesday and Thursday). Recheck INR in 3 weeks. Call us with any medication changes or bleeding concerns [phone]

## 2023-01-29 ENCOUNTER — Ambulatory Visit: Payer: Medicare Other | Attending: Cardiology | Admitting: Pharmacist

## 2023-01-29 DIAGNOSIS — Z5181 Encounter for therapeutic drug level monitoring: Secondary | ICD-10-CM | POA: Diagnosis present

## 2023-01-29 DIAGNOSIS — I4891 Unspecified atrial fibrillation: Secondary | ICD-10-CM | POA: Diagnosis not present

## 2023-01-29 LAB — POCT INR: INR: 3.9 — AB (ref 2.0–3.0)

## 2023-01-29 NOTE — Patient Instructions (Addendum)
 Description   Hold dose today, and then reduce regimen to 1/2 tablet on Sunday and Thursday and 1 tablet all other days of the week. Remain consistent with green leafy vegetable (2 times per week-Tuesday and Thursday). Recheck INR in 2 weeks. Call us  with any medication changes or bleeding concerns [phone]

## 2023-02-12 ENCOUNTER — Ambulatory Visit: Payer: Medicare Other | Attending: Cardiology

## 2023-02-12 DIAGNOSIS — I4891 Unspecified atrial fibrillation: Secondary | ICD-10-CM | POA: Diagnosis present

## 2023-02-12 DIAGNOSIS — Z5181 Encounter for therapeutic drug level monitoring: Secondary | ICD-10-CM | POA: Insufficient documentation

## 2023-02-12 LAB — POCT INR: INR: 1.9 — AB (ref 2.0–3.0)

## 2023-02-12 NOTE — Patient Instructions (Signed)
Take 1.5 tablets today then continue 1/2 tablet on Sunday and Thursday and 1 tablet all other days of the week. Remain consistent with green leafy vegetable (2 times per week-Tuesday and Thursday). Recheck INR in 3 weeks. Call us with any medication changes or bleeding concerns [phone]

## 2023-02-26 ENCOUNTER — Other Ambulatory Visit: Payer: Self-pay | Admitting: Cardiovascular Disease

## 2023-03-05 ENCOUNTER — Ambulatory Visit: Payer: Medicare Other | Attending: Cardiovascular Disease | Admitting: *Deleted

## 2023-03-05 DIAGNOSIS — I4891 Unspecified atrial fibrillation: Secondary | ICD-10-CM | POA: Insufficient documentation

## 2023-03-05 DIAGNOSIS — Z5181 Encounter for therapeutic drug level monitoring: Secondary | ICD-10-CM | POA: Insufficient documentation

## 2023-03-05 LAB — POCT INR: INR: 4 — AB (ref 2.0–3.0)

## 2023-03-05 NOTE — Patient Instructions (Signed)
Description   Do not take any warfarin today and then START taking 1 tablet daily except 1/2 tablet on Sunday, Tuesday, Thursday. Remain consistent with green leafy vegetable (2 times per week-Tuesday and Thursday). Recheck INR in 2 weeks. Call us with any medication changes or bleeding concerns [phone]

## 2023-03-20 ENCOUNTER — Ambulatory Visit: Payer: Medicare Other | Attending: Cardiovascular Disease

## 2023-03-20 DIAGNOSIS — I4891 Unspecified atrial fibrillation: Secondary | ICD-10-CM | POA: Diagnosis not present

## 2023-03-20 DIAGNOSIS — Z5181 Encounter for therapeutic drug level monitoring: Secondary | ICD-10-CM | POA: Insufficient documentation

## 2023-03-20 LAB — POCT INR: INR: 2.8 (ref 2.0–3.0)

## 2023-03-20 NOTE — Patient Instructions (Signed)
 Continue taking 1 tablet daily except 1/2 tablet on Sunday, Tuesday, Thursday. Remain consistent with green leafy vegetable (2 times per week-Tuesday and Thursday). Recheck INR in 3 weeks. Call us with any medication changes or bleeding concerns [phone]

## 2023-04-10 ENCOUNTER — Ambulatory Visit: Attending: Cardiology | Admitting: *Deleted

## 2023-04-10 DIAGNOSIS — Z5181 Encounter for therapeutic drug level monitoring: Secondary | ICD-10-CM | POA: Insufficient documentation

## 2023-04-10 DIAGNOSIS — I4891 Unspecified atrial fibrillation: Secondary | ICD-10-CM | POA: Diagnosis not present

## 2023-04-10 LAB — POCT INR: INR: 2 (ref 2.0–3.0)

## 2023-04-10 NOTE — Patient Instructions (Signed)
 Description   Today take 1 tablet of warfarin then continue taking 1 tablet daily except 1/2 tablet on Sunday, Tuesday, Thursday. Remain consistent with green leafy vegetable (2 times per week-Tuesday and Thursday). Recheck INR in 4 weeks. Call us with any medication changes or bleeding concerns [phone]

## 2023-05-08 ENCOUNTER — Encounter

## 2023-05-09 ENCOUNTER — Ambulatory Visit

## 2023-05-11 ENCOUNTER — Telehealth: Payer: Self-pay | Admitting: *Deleted

## 2023-05-11 NOTE — Telephone Encounter (Addendum)
 Received a voicemail from the pt and she states she needed a call back. Returned call to her and someone answered and never said hello. Called back and no answer and no voicemail. Will try back later.   Returned call to the patient and she stayed she is not on any oral antibiotics and she can keep the current appointment on 05/22/23 at 330pm. She was thankful for the call back.

## 2023-05-22 ENCOUNTER — Ambulatory Visit: Attending: Cardiovascular Disease

## 2023-05-22 DIAGNOSIS — Z5181 Encounter for therapeutic drug level monitoring: Secondary | ICD-10-CM

## 2023-05-22 DIAGNOSIS — I4891 Unspecified atrial fibrillation: Secondary | ICD-10-CM

## 2023-05-22 LAB — POCT INR: INR: 2.1 (ref 2.0–3.0)

## 2023-05-22 NOTE — Patient Instructions (Signed)
 continue taking 1 tablet daily except 1/2 tablet on Sunday, Tuesday, Thursday. Remain consistent with green leafy vegetable (2 times per week-Tuesday and Thursday). Recheck INR in 4 weeks. Call us  with any medication changes or bleeding concerns [phone]

## 2023-05-24 ENCOUNTER — Ambulatory Visit (INDEPENDENT_AMBULATORY_CARE_PROVIDER_SITE_OTHER): Admitting: Audiology

## 2023-05-24 ENCOUNTER — Institutional Professional Consult (permissible substitution) (INDEPENDENT_AMBULATORY_CARE_PROVIDER_SITE_OTHER): Admitting: Physician Assistant

## 2023-05-26 ENCOUNTER — Encounter (HOSPITAL_BASED_OUTPATIENT_CLINIC_OR_DEPARTMENT_OTHER): Payer: Self-pay

## 2023-05-26 ENCOUNTER — Emergency Department (HOSPITAL_BASED_OUTPATIENT_CLINIC_OR_DEPARTMENT_OTHER)
Admission: EM | Admit: 2023-05-26 | Discharge: 2023-05-27 | Disposition: A | Discharge status: Home / Self Care | Attending: Emergency Medicine | Admitting: Emergency Medicine

## 2023-05-26 ENCOUNTER — Other Ambulatory Visit: Payer: Self-pay

## 2023-05-26 ENCOUNTER — Emergency Department (HOSPITAL_BASED_OUTPATIENT_CLINIC_OR_DEPARTMENT_OTHER)

## 2023-05-26 DIAGNOSIS — R059 Cough, unspecified: Secondary | ICD-10-CM | POA: Diagnosis present

## 2023-05-26 DIAGNOSIS — R0602 Shortness of breath: Secondary | ICD-10-CM | POA: Insufficient documentation

## 2023-05-26 DIAGNOSIS — Z7901 Long term (current) use of anticoagulants: Secondary | ICD-10-CM | POA: Diagnosis not present

## 2023-05-26 DIAGNOSIS — I4891 Unspecified atrial fibrillation: Secondary | ICD-10-CM | POA: Insufficient documentation

## 2023-05-26 DIAGNOSIS — R051 Acute cough: Secondary | ICD-10-CM | POA: Insufficient documentation

## 2023-05-26 LAB — COMPREHENSIVE METABOLIC PANEL WITH GFR
ALT: 15 U/L (ref 0–44)
AST: 28 U/L (ref 15–41)
Albumin: 4 g/dL (ref 3.5–5.0)
Alkaline Phosphatase: 58 U/L (ref 38–126)
Anion gap: 13 (ref 5–15)
BUN: 9 mg/dL (ref 8–23)
CO2: 26 mmol/L (ref 22–32)
Calcium: 9.8 mg/dL (ref 8.9–10.3)
Chloride: 101 mmol/L (ref 98–111)
Creatinine, Ser: 0.86 mg/dL (ref 0.44–1.00)
GFR, Estimated: >60 mL/min (ref >60)
Glucose, Bld: 100 mg/dL — ABNORMAL HIGH (ref 70–99)
Potassium: 3 mmol/L — ABNORMAL LOW (ref 3.5–5.1)
Sodium: 140 mmol/L (ref 135–145)
Total Bilirubin: 0.7 mg/dL (ref 0.0–1.2)
Total Protein: 7 g/dL (ref 6.5–8.1)

## 2023-05-26 LAB — PROTIME-INR
INR: 3 — ABNORMAL HIGH (ref 0.8–1.2)
Prothrombin Time: 31.1 s — ABNORMAL HIGH (ref 11.4–15.2)

## 2023-05-26 LAB — RESP PANEL BY RT-PCR (RSV, FLU A&B, COVID)  RVPGX2
Influenza A by PCR: NEGATIVE
Influenza B by PCR: NEGATIVE
Resp Syncytial Virus by PCR: NEGATIVE
SARS Coronavirus 2 by RT PCR: NEGATIVE

## 2023-05-26 LAB — TROPONIN T, HIGH SENSITIVITY: Troponin T High Sensitivity: <15 ng/L (ref <19)

## 2023-05-26 LAB — CBC WITH DIFFERENTIAL/PLATELET
Abs Immature Granulocytes: 0.05 10*3/uL (ref 0.00–0.07)
Basophils Absolute: 0 10*3/uL (ref 0.0–0.1)
Basophils Relative: 0 %
Eosinophils Absolute: 0.1 10*3/uL (ref 0.0–0.5)
Eosinophils Relative: 1 %
HCT: 39.9 % (ref 36.0–46.0)
Hemoglobin: 13.4 g/dL (ref 12.0–15.0)
Immature Granulocytes: 1 %
Lymphocytes Relative: 11 %
Lymphs Abs: 1.2 10*3/uL (ref 0.7–4.0)
MCH: 30.8 pg (ref 26.0–34.0)
MCHC: 33.6 g/dL (ref 30.0–36.0)
MCV: 91.7 fL (ref 80.0–100.0)
Monocytes Absolute: 1 10*3/uL (ref 0.1–1.0)
Monocytes Relative: 9 %
Neutro Abs: 8.6 10*3/uL — ABNORMAL HIGH (ref 1.7–7.7)
Neutrophils Relative %: 78 %
Platelets: 265 10*3/uL (ref 150–400)
RBC: 4.35 MIL/uL (ref 3.87–5.11)
RDW: 13.5 % (ref 11.5–15.5)
WBC: 10.9 10*3/uL — ABNORMAL HIGH (ref 4.0–10.5)
nRBC: 0 % (ref 0.0–0.2)

## 2023-05-26 LAB — PRO BRAIN NATRIURETIC PEPTIDE: Pro Brain Natriuretic Peptide: 3602 pg/mL — ABNORMAL HIGH (ref <300.0)

## 2023-05-26 MED ORDER — ALBUTEROL SULFATE (2.5 MG/3ML) 0.083% IN NEBU
5.0000 mg | INHALATION_SOLUTION | Freq: Once | RESPIRATORY_TRACT | Status: AC
  Administered 2023-05-26: 5 mg via RESPIRATORY_TRACT
  Filled 2023-05-26: qty 6

## 2023-05-26 MED ORDER — IPRATROPIUM BROMIDE 0.02 % IN SOLN
0.5000 mg | Freq: Once | RESPIRATORY_TRACT | Status: AC
  Administered 2023-05-26: 0.5 mg via RESPIRATORY_TRACT
  Filled 2023-05-26: qty 2.5

## 2023-05-26 NOTE — ED Notes (Signed)
 Pt seen in room 6 by this RT, BLB clear and diminished. No distress noted

## 2023-05-26 NOTE — ED Provider Notes (Signed)
 [place] EMERGENCY DEPARTMENT AT [facility] Provider Note   CSN: [id number] Arrival date & time: 05/26/23  2210     History  Chief Complaint  Patient presents with   Shortness of Breath    [name] is a 83 y.o. female history of A-fib on Coumadin , here presenting with shortness of breath.  Patient states that she was with her granddaughter last week and her granddaughter got sick with viral syndrome.  She states that she has coughing for 3 to 4 days.  Some subjective fever and chills as well.  Patient had some chest pain with coughing this evening so came to the ER for evaluation.  The history is provided by the patient.       Home Medications Prior to Admission medications   Medication Sig Start Date End Date Taking? Authorizing Provider  acetaminophen  (TYLENOL ) 500 MG tablet Take 1,000 mg by mouth every 6 (six) hours as needed for moderate pain (pain score 4-6).    [provider]  albuterol  (PROVENTIL ) (2.5 MG/3ML) 0.083% nebulizer solution Take 3 mLs (2.5 mg total) by nebulization every 6 (six) hours as needed for wheezing or shortness of breath. Patient not taking: Reported on 12/18/2022 03/10/16   Parrett, Macdonald Savoy, NP  alendronate (FOSAMAX) 70 MG tablet Take 70 mg by mouth once a week. mondays 10/07/19   [provider]  Ascorbic Acid  (VITAMIN C ) 1000 MG tablet Take 1,000 mg by mouth daily.    [provider]  cholecalciferol  (VITAMIN D ) 1000 UNITS tablet Take 2,000 Units by mouth daily.    [provider]  etanercept (ENBREL) 50 MG/ML injection Inject 50 mg as directed once a week. Friday    [provider]  fluticasone  (FLONASE ) 50 MCG/ACT nasal spray Place 2 sprays into both nostrils daily as needed for rhinitis.     [provider]  folic acid  (FOLVITE ) 1 MG tablet Take 2 mg by mouth daily.     [provider]  furosemide  (LASIX ) 40 MG tablet TAKE 1 TABLET BY MOUTH EVERY DAY OR AS DIRECTED  12/26/21   Lucendia Rusk, MD  MAGNESIUM  OXIDE PO Take 1 tablet by mouth daily. With Zinc and Calcium     [provider]  methotrexate  (RHEUMATREX) 2.5 MG tablet Take 2.5 mg by mouth 3 (three) times a week. Taking Mondays, Wednesdays and Fridays    [provider]  metoprolol  succinate (TOPROL -XL) 50 MG 24 hr tablet TAKE 1 TABLET(50 MG) BY MOUTH TWICE DAILY WITH OR IMMEDIATELY FOLLOWING A MEAL 02/27/23   O'Neal, Cathay Clonts, MD  Multiple Vitamins-Minerals (MULTIVITAMIN WITH MINERALS) tablet Take 1 tablet by mouth daily.    [provider]  potassium chloride  (K-DUR,KLOR-CON ) 10 MEQ tablet Take 2 tablets (20 mEq total) by mouth daily. 01/16/16   Justina Oman, MD  pravastatin  (PRAVACHOL ) 20 MG tablet Take 20 mg by mouth at bedtime.    [provider]  predniSONE  (DELTASONE ) 2.5 MG tablet Take 2.5 mg by mouth daily with breakfast.     [provider]  traMADol  (ULTRAM ) 50 MG tablet Take 50 mg by mouth as needed for pain. 01/29/17   [provider]  verapamil  (CALAN ) 120 MG tablet Take 1 tablet (120 mg total) by mouth daily. 01/28/18   Lucendia Rusk, MD  warfarin (COUMADIN ) 5 MG tablet TAKE 1 TABLET BY MOUTH DAILY. EXCEPT 1.5 TABLETS ON WEDNESDAYS OR AS DIRECTED BY COAGULATION CLINIC 11/20/22   O'Neal, Cathay Clonts, MD  Allergies    Arthrotec [diclofenac-misoprostol], Cephalexin, Erythromycin, Morphine  sulfate, Acetaminophen , Clobetasol propionate, Leflunomide, Other, Amoxicillin, Ciprofloxacin, Diltiazem  hcl, Gold bond [menthol -zinc oxide], Gold-containing drug products, Macrodantin [nitrofurantoin macrocrystal], Morphine , Naproxen, and Penicillins    Review of Systems   Review of Systems  Respiratory:  Positive for shortness of breath.   All other systems reviewed and are negative.   Physical Exam Updated Vital Signs BP (!) 118/56   Pulse 89   Temp 98.5 F (36.9 C) (Oral)   Resp 20   SpO2 96%  Physical Exam Vitals  and nursing note reviewed.  Constitutional:      Comments: Slightly tachypneic  HENT:     Head: Normocephalic.     Mouth/Throat:     Mouth: Mucous membranes are moist.  Eyes:     Extraocular Movements: Extraocular movements intact.     Pupils: Pupils are equal, round, and reactive to light.  Cardiovascular:     Rate and Rhythm: Normal rate and regular rhythm.  Pulmonary:     Comments: Wheezing on the right side.  No retractions. Musculoskeletal:        General: Normal range of motion.     Cervical back: Normal range of motion and neck supple.  Skin:    General: Skin is warm.     Capillary Refill: Capillary refill takes less than 2 seconds.  Neurological:     General: No focal deficit present.     Mental Status: She is oriented to person, place, and time.  Psychiatric:        Mood and Affect: Mood normal.        Behavior: Behavior normal.     ED Results / Procedures / Treatments   Labs (all labs ordered are listed, but only abnormal results are displayed) Labs Reviewed  CBC WITH DIFFERENTIAL/PLATELET - Abnormal; Notable for the following components:      Result Value   WBC 10.9 (*)    Neutro Abs 8.6 (*)    All other components within normal limits  PROTIME-INR - Abnormal; Notable for the following components:   Prothrombin Time 31.1 (*)    INR 3.0 (*)    All other components within normal limits  RESP PANEL BY RT-PCR (RSV, FLU A&B, COVID)  [id number]  COMPREHENSIVE METABOLIC PANEL WITH GFR  PRO BRAIN NATRIURETIC PEPTIDE  TROPONIN T, HIGH SENSITIVITY    EKG None  Radiology No results found.  Procedures Procedures    Medications Ordered in ED Medications  albuterol  (PROVENTIL ) (2.5 MG/3ML) 0.083% nebulizer solution 5 mg (5 mg Nebulization Given 05/26/23 2241)  ipratropium (ATROVENT) nebulizer solution 0.5 mg (0.5 mg Nebulization Given 05/26/23 2241)    ED Course/ Medical Decision Making/ A&P                                 Medical Decision Making [name] is a 83 y.o. female who presented with shortness of breath.  I think likely pneumonia versus bronchitis.  Patient also had chest pain so consider ACS as well.  Plan to get CBC and CMP and BNP and troponin x 2 and chest x-ray and COVID and flu test.  Will give albuterol  and reassess  11:11 PM With the cell count is 10.  Chest x-ray is pending.  Signed out to Dr. [name] pending labs and CXR results.    Amount and/or Complexity of Data Reviewed Labs: ordered. Radiology: ordered. ECG/medicine tests: ordered.  Risk Prescription drug management.   Final Clinical Impression(s) / ED Diagnoses Final diagnoses:  None    Rx / DC Orders ED Discharge Orders     None         Dalene Duck, MD 05/26/23 2311

## 2023-05-26 NOTE — ED Triage Notes (Signed)
 Pt states that she has had productive cough and congestion x 4-5 days. Pt also reports left upper chest/shoulder pain that began when cough did.

## 2023-05-27 ENCOUNTER — Telehealth (HOSPITAL_BASED_OUTPATIENT_CLINIC_OR_DEPARTMENT_OTHER): Payer: Self-pay | Admitting: Emergency Medicine

## 2023-05-27 ENCOUNTER — Encounter: Payer: Self-pay | Admitting: Pharmacist

## 2023-05-27 MED ORDER — GUAIFENESIN ER 600 MG PO TB12: 600.0000 mg | ORAL_TABLET | Freq: Two times a day (BID) | ORAL | 0 refills | Status: AC | PRN

## 2023-05-27 MED ORDER — AZITHROMYCIN 250 MG PO TABS: ORAL_TABLET | ORAL | 0 refills | Status: AC

## 2023-05-27 NOTE — ED Notes (Signed)
 RN reviewed discharge instructions with pt. Pt verbalized understanding and had no further questions. VSS upon discharge.

## 2023-05-27 NOTE — Discharge Instructions (Signed)
 You were evaluated in the Emergency Department and after careful evaluation, we did not find any emergent condition requiring admission or further testing in the hospital.  Your exam/testing today is overall reassuring.  Cough may be due to an early pneumonia.  Take the azithromycin  antibiotic as directed.  Can use the Mucinex  as needed for cough.  You may also have a bit of fluid in your lungs from your heart failure.  Be sure to take your Lasix  every day.  Follow-up closely with your regular doctors to discuss your heart failure management.  Please return to the Emergency Department if you experience any worsening of your condition.   Thank you for allowing us  to be a part of your care.

## 2023-05-27 NOTE — ED Provider Notes (Signed)
  Provider Note MRN:  [id number]  Arrival date & time: 05/27/23    ED Course and Medical Decision Making  Assumed care of patient at sign-out or upon transfer.  Persistent cough, sick contact, was having some chest pain with the cough awaiting labs.  12 AM update: Patient well-appearing on my assessment, occasionally coughing but reassuring vital signs, no increased work of breathing.  Labs reveal negative troponin but elevated BNP.  She does not have any significant leg swelling, she denies orthopnea, she is on room air with good oxygen saturations.  X-ray demonstrating question atelectasis versus infiltrate but not a significant amount of edema.  She seems well enough to go home, will cover for possible early pneumonia with azithromycin , she has multiple antibiotic allergies.  Advised follow-up closely with her primary care doctor to discuss the heart failure management.  Procedures  Final Clinical Impressions(s) / ED Diagnoses     ICD-10-CM   1. Acute cough  R05.1       ED Discharge Orders          Ordered    azithromycin  (ZITHROMAX ) 250 MG tablet        05/27/23 0031    guaiFENesin  (MUCINEX ) 600 MG 12 hr tablet  2 times daily PRN        05/27/23 0031              Discharge Instructions      You were evaluated in the Emergency Department and after careful evaluation, we did not find any emergent condition requiring admission or further testing in the hospital.  Your exam/testing today is overall reassuring.  Cough may be due to an early pneumonia.  Take the azithromycin  antibiotic as directed.  Can use the Mucinex  as needed for cough.  You may also have a bit of fluid in your lungs from your heart failure.  Be sure to take your Lasix  every day.  Follow-up closely with your regular doctors to discuss your heart failure management.  Please return to the Emergency Department if you experience any worsening of your condition.   Thank you for allowing us  to be a part of your  care.   [name]. Nicole Lien, MD [facility] Emergency Medicine [facility] [email or web address]    Nicole Graces, MD 05/27/23 [phone]

## 2023-05-27 NOTE — ED Notes (Signed)
 Pt daughter Pearla Holtsclaw spoke with this RN regarding pts prescription change. Rx changed to doxycycline , called in to [facility] on [address], pt daughter to pick up in AM.

## 2023-05-28 ENCOUNTER — Other Ambulatory Visit: Payer: Self-pay

## 2023-05-28 DIAGNOSIS — R7989 Other specified abnormal findings of blood chemistry: Secondary | ICD-10-CM

## 2023-05-28 DIAGNOSIS — I5033 Acute on chronic diastolic (congestive) heart failure: Secondary | ICD-10-CM

## 2023-06-12 ENCOUNTER — Ambulatory Visit (INDEPENDENT_AMBULATORY_CARE_PROVIDER_SITE_OTHER): Admitting: Physician Assistant

## 2023-06-12 ENCOUNTER — Encounter (INDEPENDENT_AMBULATORY_CARE_PROVIDER_SITE_OTHER): Payer: Self-pay | Admitting: Physician Assistant

## 2023-06-12 ENCOUNTER — Ambulatory Visit (INDEPENDENT_AMBULATORY_CARE_PROVIDER_SITE_OTHER): Admitting: Audiology

## 2023-06-12 VITALS — BP 114/70 | HR 52 | Temp 96.2°F | Ht 63.0 in | Wt 130.6 lb

## 2023-06-12 DIAGNOSIS — H6121 Impacted cerumen, right ear: Secondary | ICD-10-CM
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
## 2023-06-19 ENCOUNTER — Ambulatory Visit: Attending: Cardiovascular Disease | Admitting: *Deleted

## 2023-06-19 DIAGNOSIS — Z5181 Encounter for therapeutic drug level monitoring: Secondary | ICD-10-CM | POA: Diagnosis present

## 2023-06-19 DIAGNOSIS — I4891 Unspecified atrial fibrillation: Secondary | ICD-10-CM | POA: Diagnosis present

## 2023-06-19 LAB — POCT INR: INR: 1.2 — AB (ref 2.0–3.0)

## 2023-06-19 NOTE — Patient Instructions (Signed)
 Description   Today take 1 tablet of warfarin and tomorrow take 1.5 tablets of warfarin then continue taking 1 tablet daily except 1/2 tablet on Sunday, Tuesday, Thursday. Remain consistent with green leafy vegetable (2 times per week-Tuesday and Thursday). Recheck INR in 1 week. Call us  with any medication changes or bleeding concerns [phone]

## 2023-06-27 ENCOUNTER — Ambulatory Visit: Attending: Cardiovascular Disease

## 2023-06-27 DIAGNOSIS — I4891 Unspecified atrial fibrillation: Secondary | ICD-10-CM

## 2023-06-27 DIAGNOSIS — Z5181 Encounter for therapeutic drug level monitoring: Secondary | ICD-10-CM | POA: Diagnosis present

## 2023-06-27 LAB — POCT INR: INR: 1.7 — AB (ref 2.0–3.0)

## 2023-06-27 NOTE — Patient Instructions (Signed)
 Description   Take 1.5 tablets today and then START taking 1 tablet daily except 1/2 tablet on Tuesday, Thursday.  Remain consistent with green leafy vegetable (2 times per week-Tuesday and Thursday).  Recheck INR in 2 weeks. Call us  with any medication changes or bleeding concerns [phone]

## 2023-07-04 ENCOUNTER — Ambulatory Visit (HOSPITAL_COMMUNITY)
Admission: RE | Admit: 2023-07-04 | Discharge: 2023-07-04 | Disposition: A | Discharge status: Home / Self Care | Source: Ambulatory Visit | Attending: Cardiovascular Disease | Admitting: Cardiovascular Disease

## 2023-07-04 DIAGNOSIS — R7989 Other specified abnormal findings of blood chemistry: Secondary | ICD-10-CM

## 2023-07-04 DIAGNOSIS — I5033 Acute on chronic diastolic (congestive) heart failure: Secondary | ICD-10-CM

## 2023-07-04 LAB — ECHOCARDIOGRAM COMPLETE
MV M vel: 4.9 m/s
MV Peak grad: 96 mmHg
Radius: 0.5 cm
S' Lateral: 3.5 cm

## 2023-07-08 ENCOUNTER — Ambulatory Visit: Payer: Self-pay | Admitting: Cardiovascular Disease

## 2023-07-08 MED ORDER — SPIRONOLACTONE 25 MG PO TABS: 12.5000 mg | ORAL_TABLET | Freq: Every day | ORAL | 3 refills | Status: AC

## 2023-07-11 ENCOUNTER — Ambulatory Visit: Attending: Cardiovascular Disease | Admitting: *Deleted

## 2023-07-11 DIAGNOSIS — Z5181 Encounter for therapeutic drug level monitoring: Secondary | ICD-10-CM | POA: Diagnosis present

## 2023-07-11 DIAGNOSIS — I4891 Unspecified atrial fibrillation: Secondary | ICD-10-CM | POA: Diagnosis present

## 2023-07-11 LAB — POCT INR: INR: 1.5 — AB (ref 2.0–3.0)

## 2023-07-11 NOTE — Patient Instructions (Signed)
 Description   Take 1.5 tablets of warfarin today and tomorrow take 1 tablet of warfarin then START taking 1 tablet daily except 1/2 tablet on Tuesday.  Remain consistent with green leafy vegetable (2 times per week-Tuesday and Thursday).  Recheck INR in 2 weeks. Call us  with any medication changes or bleeding concerns [phone]

## 2023-07-11 NOTE — Telephone Encounter (Signed)
 Patient called in requesting echo results. Advised of mychart message.   Patient states she will begin increase in Lasix  tomorrow and daughter is on the way to pick up spironolactone now.  Appt setup for 07/02 @ 2:45 pm with NP, Josefa Beauvais.

## 2023-07-11 NOTE — Progress Notes (Signed)
Please see anticoagulation encounter.

## 2023-07-16 NOTE — Progress Notes (Unsigned)
 Cardiology Clinic Note   Patient Name: [name] Date of Encounter: 07/18/2023  Primary Care Provider:  Elliot Charm, MD Primary Cardiologist:  Darryle ONEIDA Decent, MD  Patient Profile    [name] presents to the clinic today for follow-up evaluation of her atrial fibrillation and diastolic CHF.  Past Medical History    Past Medical History:  Diagnosis Date   Acne rosacea    Adhesive capsulitis of left shoulder 1980   Allergic rhinitis    Chronic atrial fibrillation (HCC)    a. Coumadin  anticoagulation - [facility] ([address])   Chronic bronchitis (HCC)    Esophageal reflux    Frequent epistaxis    cause of my coumadin  (12/16/2015)   Heart murmur    History of echocardiogram    a. Echo 4/17:  EF 55-60%, no RWMA, mild MR, mild LAE, PASP 48 mmHg, trivial effusion post to heart   Hypercholesterolemia    Hyperlipidemia    Hypertension    Osteopenia    Pleural effusion associated with pulmonary infection    Admx 5/17 >> required L thoracentesis (cytology neg for malignancy)    Pneumonia    I've had it 4 times this year (12/16/2015)   Polymorphic light eruption 2004   Prolonged QT interval    Pulmonary HTN (HCC)    Respiratory failure (HCC) 04/2015   spent 3 days; went home; then another 13 days in hospital (12/16/2015   Rheumatoid arthritis (HCC) 1975   Right middle ear infection 1986   Skin cancer    burndt it off at the right side of my nose/face (12/16/2015)   Past Surgical History:  Procedure Laterality Date   BREAST BIOPSY Left 2000s X 2   CARDIAC CATHETERIZATION  03/2010   a. Myoview 2/09: no ischemia; low risk  //  b. LHC 3/12: normal coronary arteries   CARDIAC CATHETERIZATION N/A 12/16/2015   Procedure: Pericardiocentesis;  Surgeon: Lonni Hanson, MD;  Location: [facility] INVASIVE CV LAB;  Service: Cardiovascular;  Laterality: N/A;   CARDIOVERSION  04/2010   TEMPOROMANDIBULAR JOINT SURGERY Bilateral    TOTAL ABDOMINAL  HYSTERECTOMY      Allergies  Allergies  Allergen Reactions   Arthrotec [Diclofenac-Misoprostol] Diarrhea   Azithromycin  Anaphylaxis   Cephalexin Itching and Rash    Rash and blisters   Erythromycin Anaphylaxis        Morphine  Sulfate Other (See Comments)    Reaction:  Hallucinations    Acetaminophen  Other (See Comments)   Clobetasol Propionate Other (See Comments)   Leflunomide Other (See Comments)   Other Other (See Comments)   Amoxicillin Rash and Other (See Comments)    Has patient had a PCN reaction causing immediate rash, facial/tongue/throat swelling, SOB or lightheadedness with hypotension: Yes Has patient had a PCN reaction causing severe rash involving mucus membranes or skin necrosis: Yes Has patient had a PCN reaction that required hospitalization No Has patient had a PCN reaction occurring within the last 10 years: No If all of the above answers are NO, then may proceed with Cephalosporin use.   Ciprofloxacin Rash   Diltiazem  Hcl Swelling, Rash and Other (See Comments)    Pt is able to tolerate Verapamil .     Gold Bond [Menthol -Zinc Oxide] Rash   Gold-Containing Drug Products Rash   Macrodantin [Nitrofurantoin Macrocrystal] Rash   Morphine  Rash and Other (See Comments)   Naproxen Rash   Penicillins Rash and Other (See Comments)    ++ tolerates cefepime  and ceftriaxone  ++  Has patient had a PCN reaction causing immediate rash, facial/tongue/throat swelling, SOB or lightheadedness with hypotension: Yes Has patient had a PCN reaction causing severe rash involving mucus membranes or skin necrosis: Yes Has patient had a PCN reaction that required hospitalization No Has patient had a PCN reaction occurring within the last 10 years: No If all of the above answers are NO, then may proceed with Cephalosporin use.     History of Present Illness    [name] has a PMH of atrial fibrillation, HTN TN, HLD, and pericardial effusion.  She is a retired  Engineer, civil (consulting).  She was seen in follow-up by Dr. Barbaraann on 12/18/2022.  During that time she reported that she was taking her verapamil , metoprolol  and Coumadin  as prescribed.  She was doing well with these.  She did note a recent elevation in her INR due to antibiotic use.  She reported feeling that her heart would race when doing strenuous activities such as going uphill however, during routine activities her heart rate was normal.  She did note some shortness of breath which she attributed to the colder weather.  She denied ever having heart attack or stroke.  She did note a family history of heart disease.  She denied EtOH and tobacco use.  Her EKG showed atrial fibrillation with RVR 121 bpm.  On recheck her heart rate was noted to be 90.  Repeat echocardiogram was ordered.  Echocardiogram 07/04/2023 showed LVEF of 45-50%, global hypokinesis, moderately reduced RV function, right ventricular systolic pressure 43.3 mmHg.  Mildly-moderately dilated left atria, severely dilated right atria, mild aortic valve regurgitation and a mildly dilated ascending aorta measuring 40 mm.  She was noted to have worsening RV dilation and tricuspid valve regurgitation since her previous echocardiogram.  Dr. Barbaraann reviewed echocardiogram and recommended increasing Lasix  to 40 mg twice daily for 3 days with daily dosing afterwards.  She was also prescribed spironolactone  12.5 mg daily.  Her potassium supplementation was continued.  Follow-up in the next 1 to 2 weeks was advised.  She presents to the clinic today for follow-up evaluation and states she feels well today.  She notes that she did have some cramping with increased dosing of Lasix .  She reports that today she has not had any cramping.  We reviewed her echocardiogram.  She expressed understanding.  She notes that she worked as a Engineer, civil (consulting) for 58 years.  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.  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  I will continue her  current medication, order a BMP today and plan follow-up in 3 to 4 months.  Today she denies chest pain, shortness of breath, lower extremity edema, fatigue, palpitations, melena, hematuria, hemoptysis, diaphoresis, weakness, presyncope, syncope, orthopnea, and PND.    Home Medications    Prior to Admission medications   Medication Sig Start Date End Date Taking? Authorizing Provider  acetaminophen  (TYLENOL ) 500 MG tablet Take 1,000 mg by mouth every 6 (six) hours as needed for moderate pain (pain score 4-6).    [provider]  albuterol  (PROVENTIL ) (2.5 MG/3ML) 0.083% nebulizer solution Take 3 mLs (2.5 mg total) by nebulization every 6 (six) hours as needed for wheezing or shortness of breath. Patient not taking: Reported on 12/18/2022 03/10/16   Parrett, Madelin RAMAN, NP  alendronate (FOSAMAX) 70 MG tablet Take 70 mg by mouth once a week. mondays 10/07/19   [provider]  Ascorbic Acid  (VITAMIN C ) 1000 MG tablet Take  1,000 mg by mouth daily.    [provider]  azithromycin  (ZITHROMAX ) 250 MG tablet Take 2 tablets together on the first day, then 1 every day until finished. 05/27/23   Theadore Ozell HERO, MD  cholecalciferol  (VITAMIN D ) 1000 UNITS tablet Take 2,000 Units by mouth daily.    [provider]  etanercept (ENBREL) 50 MG/ML injection Inject 50 mg as directed once a week. Friday    [provider]  fluticasone  (FLONASE ) 50 MCG/ACT nasal spray Place 2 sprays into both nostrils daily as needed for rhinitis.     [provider]  folic acid  (FOLVITE ) 1 MG tablet Take 2 mg by mouth daily.     [provider]  furosemide  (LASIX ) 40 MG tablet TAKE 1 TABLET BY MOUTH EVERY DAY OR AS DIRECTED 12/26/21   Dann Candyce RAMAN, MD  guaiFENesin  (MUCINEX ) 600 MG 12 hr tablet Take 1 tablet (600 mg total) by mouth 2 (two) times daily as needed. 05/27/23   Theadore Ozell HERO, MD  MAGNESIUM  OXIDE PO Take 1 tablet by mouth daily. With Zinc and Calcium      [provider]  methotrexate  (RHEUMATREX) 2.5 MG tablet Take 2.5 mg by mouth 3 (three) times a week. Taking Mondays, Wednesdays and Fridays    [provider]  metoprolol  succinate (TOPROL -XL) 50 MG 24 hr tablet TAKE 1 TABLET(50 MG) BY MOUTH TWICE DAILY WITH OR IMMEDIATELY FOLLOWING A MEAL 02/27/23   O'Neal, Darryle Ned, MD  Multiple Vitamins-Minerals (MULTIVITAMIN WITH MINERALS) tablet Take 1 tablet by mouth daily.    [provider]  potassium chloride  (K-DUR,KLOR-CON ) 10 MEQ tablet Take 2 tablets (20 mEq total) by mouth daily. 01/16/16   Ricky Fines, MD  pravastatin  (PRAVACHOL ) 20 MG tablet Take 20 mg by mouth at bedtime.    [provider]  predniSONE  (DELTASONE ) 2.5 MG tablet Take 2.5 mg by mouth daily with breakfast.     [provider]  spironolactone  (ALDACTONE ) 25 MG tablet Take 0.5 tablets (12.5 mg total) by mouth daily. 07/08/23 10/06/23  O'NealDarryle Ned, MD  traMADol  (ULTRAM ) 50 MG tablet Take 50 mg by mouth as needed for pain. 01/29/17   [provider]  verapamil  (CALAN ) 120 MG tablet Take 1 tablet (120 mg total) by mouth daily. 01/28/18   Dann Candyce RAMAN, MD  warfarin (COUMADIN ) 5 MG tablet TAKE 1 TABLET BY MOUTH DAILY. EXCEPT 1.5 TABLETS ON WEDNESDAYS OR AS DIRECTED BY COAGULATION CLINIC 11/20/22   O'Neal, Darryle Ned, MD    Family History    Family History  Problem Relation Age of Onset   Heart disease Father    Heart disease Mother    Other Mother        prolonged qtc   Heart disease Sister    Heart disease Sister    Healthy Sister    Fainting Neg Hx    She indicated that her mother is deceased. She indicated that her father is deceased. She indicated that all of her three sisters are alive. She indicated that her maternal grandmother is deceased. She indicated that her maternal grandfather is deceased. She indicated that her paternal grandmother is deceased. She indicated that her paternal grandfather is  deceased. She indicated that the status of her neg hx is unknown.  Social History    Social History   Socioeconomic History   Marital status: Widowed    Spouse name: Not on file   Number of children: Not on file   Years of  education: Not on file   Highest education level: Not on file  Occupational History   Occupation: Retired Engineer, civil (consulting)  Tobacco Use   Smoking status: Never   Smokeless tobacco: Never  Vaping Use   Vaping status: Never Used  Substance and Sexual Activity   Alcohol  use: No   Drug use: No   Sexual activity: Never  Other Topics Concern   Not on file  Social History Narrative   Not on file   Social Drivers of Health   Financial Resource Strain: Not on file  Food Insecurity: Not on file  Transportation Needs: Not on file  Physical Activity: Not on file  Stress: Not on file  Social Connections: Not on file  Intimate Partner Violence: Not on file     Review of Systems    General:  No chills, fever, night sweats or weight changes.  Cardiovascular:  No chest pain, dyspnea on exertion, edema, orthopnea, palpitations, paroxysmal nocturnal dyspnea. Dermatological: No rash, lesions/masses Respiratory: No cough, dyspnea Urologic: No hematuria, dysuria Abdominal:   No nausea, vomiting, diarrhea, bright red blood per rectum, melena, or hematemesis Neurologic:  No visual changes, wkns, changes in mental status. All other systems reviewed and are otherwise negative except as noted above.  Physical Exam    VS:  BP 122/72   Pulse 68   Ht 5' 3 (1.6 m)   Wt 130 lb (59 kg)   SpO2 98%   BMI 23.03 kg/m  , BMI Body mass index is 23.03 kg/m. GEN: Well nourished, well developed, in no acute distress. HEENT: normal. Neck: Supple, no JVD, carotid bruits, or masses. Cardiac: RRR, no murmurs, rubs, or gallops. No clubbing, cyanosis, edema.  Radials/DP/PT 2+ and equal bilaterally.  Respiratory:  Respirations regular and unlabored, clear to auscultation bilaterally. GI:  Soft, nontender, nondistended, BS + x 4. MS: no deformity or atrophy. Skin: warm and dry, no rash. Neuro:  Strength and sensation are intact. Psych: Normal affect.  Accessory Clinical Findings    Recent Labs: 05/26/2023: ALT 15; BUN 9; Creatinine, Ser 0.86; Hemoglobin 13.4; Platelets 265; Potassium 3.0; Pro Brain Natriuretic Peptide 3,602.0; Sodium 140   Recent Lipid Panel    Component Value Date/Time   CHOL 134 05/24/2015 1344   TRIG 148.0 05/21/2013 0846   HDL 36.60 (L) 05/21/2013 0846   CHOLHDL 4 05/21/2013 0846   VLDL 29.6 05/21/2013 0846   LDLCALC 79 05/21/2013 0846         ECG personally reviewed by me today- EKG Interpretation Date/Time:  Wednesday July 18 2023 14:46:01 EDT Ventricular Rate:  68 PR Interval:    QRS Duration:  74 QT Interval:  464 QTC Calculation: 493 R Axis:   -9  Text Interpretation: Atrial fibrillation Cannot rule out Anterior infarct , age undetermined When compared with ECG of 18-Dec-2022 15:02, Vent. rate has decreased BY  53 BPM Nonspecific T wave abnormality, improved in Lateral leads Confirmed by Emelia Hazy [phone]) on 07/18/2023 3:15:51 PM    Echocardiogram 07/04/2023  IMPRESSIONS     1. Left ventricular ejection fraction, by estimation, is 45 to 50%. Left  ventricular ejection fraction by 3D volume is 55 %. The left ventricle has  mildly decreased function. The left ventricle demonstrates global  hypokinesis. Left ventricular diastolic   function could not be evaluated.   2. Right ventricular systolic function is moderately reduced. The right  ventricular size is moderately enlarged. There is mildly elevated  pulmonary artery systolic pressure. The estimated right ventricular  systolic  pressure is 43.3 mmHg.   3. Left atrial size was mild to moderately dilated.   4. Right atrial size was severely dilated.   5. The mitral valve is normal in structure. No evidence of mitral valve  regurgitation. No evidence of mitral stenosis.   6.  Tricuspid valve regurgitation is moderate.   7. The aortic valve is calcified. Aortic valve regurgitation is mild.  Aortic valve sclerosis/calcification is present, without any evidence of  aortic stenosis.   8. Aortic dilatation noted. There is mild dilatation of the ascending  aorta, measuring 40 mm.   9. The inferior vena cava is dilated in size with <50% respiratory  variability, suggesting right atrial pressure of 15 mmHg.   Comparison(s): Significant worsening of RV dilation and TR since previous  echo.   FINDINGS   Left Ventricle: Left ventricular ejection fraction, by estimation, is 45  to 50%. Left ventricular ejection fraction by 3D volume is 55 %. The left  ventricle has mildly decreased function. The left ventricle demonstrates  global hypokinesis. 3D ejection  fraction reviewed and evaluated as part of the interpretation. Alternate  measurement of EF is felt to be most reflective of LV function. The left  ventricular internal cavity size was normal in size. There is no left  ventricular hypertrophy. Left  ventricular diastolic function could not be evaluated due to atrial  fibrillation. Left ventricular diastolic function could not be evaluated.   Right Ventricle: The right ventricular size is moderately enlarged. No  increase in right ventricular wall thickness. Right ventricular systolic  function is moderately reduced. There is mildly elevated pulmonary artery  systolic pressure. The tricuspid  regurgitant velocity is 2.66 m/s, and with an assumed right atrial  pressure of 15 mmHg, the estimated right ventricular systolic pressure is  43.3 mmHg.   Left Atrium: Left atrial size was mild to moderately dilated.   Right Atrium: Right atrial size was severely dilated.   Pericardium: There is no evidence of pericardial effusion.   Mitral Valve: The mitral valve is normal in structure. No evidence of  mitral valve regurgitation. No evidence of mitral valve stenosis.    Tricuspid Valve: The tricuspid valve is normal in structure. Tricuspid  valve regurgitation is moderate . No evidence of tricuspid stenosis.   Aortic Valve: The aortic valve is calcified. Aortic valve regurgitation is  mild. Aortic valve sclerosis/calcification is present, without any  evidence of aortic stenosis.   Pulmonic Valve: The pulmonic valve was normal in structure. Pulmonic valve  regurgitation is not visualized. No evidence of pulmonic stenosis.   Aorta: Aortic dilatation noted. There is mild dilatation of the ascending  aorta, measuring 40 mm.   Venous: The inferior vena cava is dilated in size with less than 50%  respiratory variability, suggesting right atrial pressure of 15 mmHg.   IAS/Shunts: No atrial level shunt detected by color flow Doppler.      Assessment & Plan   1.  HFpEF-weight today 130 pounds.  Euvolemic.  Feeling better after recent diuresis.  Echocardiogram 07/04/2023 showed an LVEF of 45-50%, moderately reduced RV function, RV systolic pressure 43.3.  Mildly-moderately dilated left atrium and severely dilated right atria. Heart healthy low-sodium diet Daily weights Fluid restriction Continue furosemide , metoprolol , spironolactone , potassium Order BMP  Permanent atrial fibrillation-heart rate today 68 bpm. Avoid triggers caffeine, chocolate, EtOH, dehydration etc. Maintain p.o. hydration Continue metoprolol , warfarin, verapamil   Essential hypertension-BP today 122/76. Maintain blood pressure log Continue metoprolol , verapamil , spironolactone   Hyperlipidemia-LDL 81 on 11/06/2022.  High-fiber diet Continue pravastatin  Increase physical activity as tolerated  History of pericardial effusion-status post pericardiocentesis 2017.   No evidence of pericardial effusion 07/04/2023.  Disposition: Follow-up with Dr. Barbaraann or me in 3-4 months.   Josefa HERO. Sapir Lavey NP-C     07/18/2023, 3:17 PM [facility] [address]  [address] Office [phone] Fax [phone]    I spent 14 minutes examining this patient, reviewing medications, and using patient centered shared decision making involving their cardiac care.   I spent  20 minutes reviewing past medical history,  medications, and prior cardiac tests.

## 2023-07-17 ENCOUNTER — Telehealth: Payer: Self-pay | Admitting: *Deleted

## 2023-07-17 NOTE — Telephone Encounter (Signed)
 Received a message from Scheduling to call the pt. Spoke with patient and confirmed that she has appointments on 7/2 and 7/9; she stated the Scheduler confirmed that as well and was thankful for the call back.

## 2023-07-18 ENCOUNTER — Ambulatory Visit: Attending: General Practice | Admitting: General Practice

## 2023-07-18 ENCOUNTER — Encounter: Payer: Self-pay | Admitting: General Practice

## 2023-07-18 ENCOUNTER — Ambulatory Visit: Payer: Self-pay | Admitting: *Deleted

## 2023-07-18 VITALS — BP 122/72 | HR 68 | Ht 63.0 in | Wt 130.0 lb

## 2023-07-18 DIAGNOSIS — E782 Mixed hyperlipidemia: Secondary | ICD-10-CM | POA: Insufficient documentation

## 2023-07-18 DIAGNOSIS — I3139 Other pericardial effusion (noninflammatory): Secondary | ICD-10-CM | POA: Insufficient documentation

## 2023-07-18 DIAGNOSIS — I10 Essential (primary) hypertension: Secondary | ICD-10-CM | POA: Insufficient documentation

## 2023-07-18 DIAGNOSIS — I4891 Unspecified atrial fibrillation: Secondary | ICD-10-CM | POA: Insufficient documentation

## 2023-07-18 DIAGNOSIS — I5033 Acute on chronic diastolic (congestive) heart failure: Secondary | ICD-10-CM | POA: Diagnosis not present

## 2023-07-18 NOTE — Patient Instructions (Signed)
 Medication Instructions:  NO CHANGES    Lab Work: BMET TO BE DONE TODAY   Testing/Procedures: NONE  Follow-Up: At [facility], you and your health needs are our priority.  As part of our continuing mission to provide you with exceptional heart care, our providers are all part of one team.  This team includes your primary Cardiologist (physician) and Advanced Practice Providers or APPs (Physician Assistants and Nurse Practitioners) who all work together to provide you with the care you need, when you need it.  Your next appointment:   3-4 MONTHS  Provider:   Darryle ONEIDA Decent, MD OR Josefa Beauvais, NP    Other Instructions SALTY 6 LITERATURE HAS BEEN GIVEN TODAY.

## 2023-07-18 NOTE — Addendum Note (Signed)
 Addended by: BILLY CAMELIA CROME on: 07/18/2023 03:20 PM   Modules accepted: Orders

## 2023-07-19 ENCOUNTER — Ambulatory Visit: Payer: Self-pay | Admitting: General Practice

## 2023-07-19 LAB — BASIC METABOLIC PANEL WITH GFR
BUN/Creatinine Ratio: 18 (ref 12–28)
BUN: 17 mg/dL (ref 8–27)
CO2: 24 mmol/L (ref 20–29)
Calcium: 10.7 mg/dL — ABNORMAL HIGH (ref 8.7–10.3)
Chloride: 101 mmol/L (ref 96–106)
Creatinine, Ser: 0.92 mg/dL (ref 0.57–1.00)
Glucose: 97 mg/dL (ref 70–99)
Potassium: 4.2 mmol/L (ref 3.5–5.2)
Sodium: 141 mmol/L (ref 134–144)
eGFR: 63 mL/min/{1.73_m2} (ref >59)

## 2023-07-25 ENCOUNTER — Ambulatory Visit: Attending: Cardiovascular Disease

## 2023-07-25 DIAGNOSIS — I4891 Unspecified atrial fibrillation: Secondary | ICD-10-CM | POA: Insufficient documentation

## 2023-07-25 DIAGNOSIS — Z5181 Encounter for therapeutic drug level monitoring: Secondary | ICD-10-CM | POA: Diagnosis present

## 2023-07-25 LAB — POCT INR: INR: 3.3 — AB (ref 2.0–3.0)

## 2023-07-25 NOTE — Patient Instructions (Signed)
 Description   HOLD today's dose and then continue taking 1 tablet daily except 1/2 tablet on Tuesday.  Remain consistent with green leafy vegetable (2 times per week-Tuesday and Thursday).  Recheck INR in 3 weeks. Call us  with any medication changes or bleeding concerns [phone]

## 2023-07-25 NOTE — Progress Notes (Signed)
Please see anticoagulation encounter.

## 2023-08-15 ENCOUNTER — Ambulatory Visit: Attending: Cardiovascular Disease | Admitting: *Deleted

## 2023-08-15 DIAGNOSIS — Z5181 Encounter for therapeutic drug level monitoring: Secondary | ICD-10-CM | POA: Insufficient documentation

## 2023-08-15 DIAGNOSIS — I4891 Unspecified atrial fibrillation: Secondary | ICD-10-CM | POA: Insufficient documentation

## 2023-08-15 LAB — POCT INR: INR: 2.8 (ref 2.0–3.0)

## 2023-08-15 NOTE — Patient Instructions (Addendum)
  Description   Continue taking warfarin 1 tablet daily except 1/2 tablet on Tuesday.  Remain consistent with green leafy vegetable (2 times per week-Tuesday and Thursday).  Recheck INR in 4 weeks. Call us  with any medication changes or bleeding concerns [phone]

## 2023-08-15 NOTE — Progress Notes (Signed)
 INR-2.8 Please see anticoagulation encounter

## 2023-09-11 ENCOUNTER — Ambulatory Visit: Attending: Cardiovascular Disease | Admitting: Pharmacist

## 2023-09-11 DIAGNOSIS — Z5181 Encounter for therapeutic drug level monitoring: Secondary | ICD-10-CM | POA: Insufficient documentation

## 2023-09-11 DIAGNOSIS — I4891 Unspecified atrial fibrillation: Secondary | ICD-10-CM | POA: Insufficient documentation

## 2023-09-11 LAB — POCT INR: INR: 2.9 (ref 2.0–3.0)

## 2023-09-11 NOTE — Patient Instructions (Signed)
 Description   Continue taking warfarin 1 tablet daily except 1/2 tablet on Tuesday.  Remain consistent with green leafy vegetable (2 times per week-Tuesday and Thursday).  Recheck INR in 5 weeks. Call us  with any medication changes or bleeding concerns [phone]

## 2023-09-11 NOTE — Progress Notes (Signed)
 INR 2.9; Please see anticoagulation encounter   Description   Continue taking warfarin 1 tablet daily except 1/2 tablet on Tuesday.  Remain consistent with green leafy vegetable (2 times per week-Tuesday and Thursday).  Recheck INR in 5 weeks. Call us  with any medication changes or bleeding concerns [phone]

## 2023-10-08 ENCOUNTER — Other Ambulatory Visit: Payer: Self-pay

## 2023-10-08 DIAGNOSIS — I482 Chronic atrial fibrillation, unspecified: Secondary | ICD-10-CM

## 2023-10-08 MED ORDER — WARFARIN SODIUM 5 MG PO TABS: ORAL_TABLET | ORAL | 1 refills | Status: AC

## 2023-10-16 ENCOUNTER — Ambulatory Visit: Attending: Cardiovascular Disease | Admitting: *Deleted

## 2023-10-16 DIAGNOSIS — Z5181 Encounter for therapeutic drug level monitoring: Secondary | ICD-10-CM | POA: Diagnosis present

## 2023-10-16 DIAGNOSIS — I4891 Unspecified atrial fibrillation: Secondary | ICD-10-CM | POA: Diagnosis present

## 2023-10-16 LAB — POCT INR: INR: 3.2 — AB (ref 2.0–3.0)

## 2023-10-16 NOTE — Patient Instructions (Signed)
 Description   INR-3.2; Do not take any warfarin today then continue taking warfarin 1 tablet daily except 1/2 tablet on Tuesday.  Remain consistent with green leafy vegetable (2 times per week-Tuesday and Thursday).  Recheck INR in 4 weeks. Call us  with any medication changes or bleeding concerns [phone]

## 2023-10-16 NOTE — Progress Notes (Signed)
 Description   INR-3.2; Do not take any warfarin today then continue taking warfarin 1 tablet daily except 1/2 tablet on Tuesday.  Remain consistent with green leafy vegetable (2 times per week-Tuesday and Thursday).  Recheck INR in 4 weeks. Call us  with any medication changes or bleeding concerns [phone]

## 2023-10-28 NOTE — Progress Notes (Unsigned)
 Cardiology Office Note:  .   Date:  10/29/2023  ID:  [name], DOB 12/02/1940, MRN [id number] PCP: Elliot Charm, MD  [facility] Providers Cardiologist:  Darryle ONEIDA Decent, MD { History of Present Illness: .    Chief Complaint  Patient presents with   Follow-up         [name] is a 83 y.o. female with history of Afib, HFmEF, HTN, HLD who presents for follow-up.   History of Present Illness   [name] is an 83 year old female with permanent AFib, HFmEF, hypertension, and hyperlipidemia who presents for follow-up. She is accompanied by her niece.  She has a history of osteoporosis and arthritis and experiences sleep disturbances without magnesium  supplementation. She is concerned about stopping calcium  supplements due to elevated calcium  levels and plans to discuss this with her primary doctor.  She is currently taking Lasix  60 mg daily for fluid management. Her legs broke out this week, which she attributes to nerves or possibly related to her medication. She elevates her legs and occasionally wears compression stockings. She is mindful of her salt intake but admits to occasionally adding salt to tomatoes.  Her blood pressure is generally well-controlled at home, though it has been elevated during periods of stress, such as following the recent death of her granddaughter. No significant chest pain or trouble breathing. She monitors her blood pressure and pulse daily, noting that her pulse is 'terrible' and her wrist is painful, affecting her ability to write.  She is on Coumadin  for her AFib with a target INR of 2 to 3. She has discussed the cost of Eliquis , which is currently prohibitive, but is hopeful for a generic version next year. She has been on Coumadin  for a long time and is aware of the cost issues with alternative medications.  She has experienced significant family stress recently, including the death of her granddaughter and her  daughter's cancer diagnosis. Her granddaughter, who lived with her, passed away unexpectedly, and the cause is still under investigation. Her daughter is undergoing chemotherapy, and she has been informed that treatment options are limited.          Problem List Permanent Afib HTN HLD -T chol 161, HDL 48, LDL 81, TG 194 RA HFmEF -EF 45-50% Pericardial effusion s/p pericardiocentesis (2017)    ROS: All other ROS reviewed and negative. Pertinent positives noted in the HPI.     Studies Reviewed: SABRA       TTE 07/04/2023  1. Left ventricular ejection fraction, by estimation, is 45 to 50%. Left  ventricular ejection fraction by 3D volume is 55 %. The left ventricle has  mildly decreased function. The left ventricle demonstrates global  hypokinesis. Left ventricular diastolic   function could not be evaluated.   2. Right ventricular systolic function is moderately reduced. The right  ventricular size is moderately enlarged. There is mildly elevated  pulmonary artery systolic pressure. The estimated right ventricular  systolic pressure is 43.3 mmHg.   3. Left atrial size was mild to moderately dilated.   4. Right atrial size was severely dilated.   5. The mitral valve is normal in structure. No evidence of mitral valve  regurgitation. No evidence of mitral stenosis.   6. Tricuspid valve regurgitation is moderate.   7. The aortic valve is calcified. Aortic valve regurgitation is mild.  Aortic valve sclerosis/calcification is present, without any evidence of  aortic stenosis.   8. Aortic dilatation noted. There  is mild dilatation of the ascending  aorta, measuring 40 mm.   9. The inferior vena cava is dilated in size with <50% respiratory  variability, suggesting right atrial pressure of 15 mmHg.  Physical Exam:   VS:  BP 99/65 (BP Location: Left Arm, Patient Position: Sitting, Cuff Size: Normal)   Pulse 81   Ht 5' 3 (1.6 m)   Wt 136 lb (61.7 kg)   SpO2 97%   BMI 24.09 kg/m     Wt Readings from Last 3 Encounters:  10/29/23 136 lb (61.7 kg)  07/18/23 130 lb (59 kg)  06/12/23 130 lb 9.6 oz (59.2 kg)    GEN: Well nourished, well developed in no acute distress NECK: No JVD; No carotid bruits CARDIAC: irregular rate, no murmurs, rubs, gallops RESPIRATORY:  Clear to auscultation without rales, wheezing or rhonchi  ABDOMEN: Soft, non-tender, non-distended EXTREMITIES:  No edema; No deformity  ASSESSMENT AND PLAN: .   Assessment and Plan    Heart failure with mid-range ejection fraction EF 45-50%. No worsening volume overload. Reports trace edema, denies significant chest pain or dyspnea. Blood pressure 99/65 today, well controlled at home. - Continue Lasix  60 mg daily. - Monitor blood pressure at home. - Elevate legs and wear compression stockings. - Limit sodium intake. - BMP today given high calcium  on last draw.   Permanent atrial fibrillation Heart rate in the 80s. Discussed potential switch to Eliquis  when it becomes generic next year due to cost concerns. - Continue metoprolol  succinate 50 mg daily. - Continue verapamil  120 mg daily. - Continue Coumadin  with goal INR 2-3. - Discuss switch to Eliquis  when it becomes generic next year.  Chronic venous insufficiency with lower extremity edema and dermatitis Reports trace edema and dermatitis on lower extremities. Swelling may cause blood vessels to leak, contributing to dermatitis. - Elevate legs. - Wear compression stockings. - Continue Lasix  60 mg daily.  Renovascular hypertension Blood pressure 99/65 today, well controlled at home.              Follow-up: Return in about 1 year (around 10/28/2024).   Signed, Darryle DASEN. Barbaraann, MD, [facility]  [facility]  [address] [place], [place] [address] [phone]  2:39 PM

## 2023-10-29 ENCOUNTER — Ambulatory Visit: Attending: Cardiovascular Disease | Admitting: Cardiovascular Disease

## 2023-10-29 ENCOUNTER — Encounter: Payer: Self-pay | Admitting: Cardiovascular Disease

## 2023-10-29 ENCOUNTER — Other Ambulatory Visit (HOSPITAL_COMMUNITY): Payer: Self-pay

## 2023-10-29 VITALS — BP 99/65 | HR 81 | Ht 63.0 in | Wt 136.0 lb

## 2023-10-29 DIAGNOSIS — I5020 Unspecified systolic (congestive) heart failure: Secondary | ICD-10-CM | POA: Insufficient documentation

## 2023-10-29 DIAGNOSIS — I4821 Permanent atrial fibrillation: Secondary | ICD-10-CM | POA: Diagnosis not present

## 2023-10-29 DIAGNOSIS — E782 Mixed hyperlipidemia: Secondary | ICD-10-CM | POA: Insufficient documentation

## 2023-10-29 DIAGNOSIS — I150 Renovascular hypertension: Secondary | ICD-10-CM | POA: Diagnosis present

## 2023-10-29 MED ORDER — FLUZONE HIGH-DOSE 0.5 ML IM SUSY
0.5000 mL | PREFILLED_SYRINGE | Freq: Once | INTRAMUSCULAR | 0 refills | Status: AC
  Filled 2023-10-29: qty 0.5, 1d supply, fill #0

## 2023-10-29 NOTE — Patient Instructions (Signed)
 Medication Instructions:  No medication changes were made at this visit. Continue current regimen.   *If you need a refill on your cardiac medications before your next appointment, please call your pharmacy*  Lab Work: To be completed today: BMP  If you have labs (blood work) drawn today and your tests are completely normal, you will receive your results only by: MyChart Message (if you have MyChart) OR A paper copy in the mail If you have any lab test that is abnormal or we need to change your treatment, we will call you to review the results.  Testing/Procedures: None ordered today.  Follow-Up: At [facility], you and your health needs are our priority.  As part of our continuing mission to provide you with exceptional heart care, our providers are all part of one team.  This team includes your primary Cardiologist (physician) and Advanced Practice Providers or APPs (Physician Assistants and Nurse Practitioners) who all work together to provide you with the care you need, when you need it.  Your next appointment:   1 year(s)  Provider:   Dr. Barbaraann or APP

## 2023-10-30 ENCOUNTER — Ambulatory Visit: Payer: Self-pay | Admitting: Cardiovascular Disease

## 2023-10-30 LAB — BASIC METABOLIC PANEL WITH GFR
BUN/Creatinine Ratio: 10 — ABNORMAL LOW (ref 12–28)
BUN: 9 mg/dL (ref 8–27)
CO2: 24 mmol/L (ref 20–29)
Calcium: 9.5 mg/dL (ref 8.7–10.3)
Chloride: 106 mmol/L (ref 96–106)
Creatinine, Ser: 0.86 mg/dL (ref 0.57–1.00)
Glucose: 97 mg/dL (ref 70–99)
Potassium: 3.7 mmol/L (ref 3.5–5.2)
Sodium: 144 mmol/L (ref 134–144)
eGFR: 67 mL/min/1.73 (ref >59)

## 2023-11-13 ENCOUNTER — Ambulatory Visit

## 2023-11-16 ENCOUNTER — Ambulatory Visit: Attending: Cardiovascular Disease

## 2023-11-16 DIAGNOSIS — I4891 Unspecified atrial fibrillation: Secondary | ICD-10-CM | POA: Diagnosis present

## 2023-11-16 DIAGNOSIS — Z5181 Encounter for therapeutic drug level monitoring: Secondary | ICD-10-CM | POA: Diagnosis present

## 2023-11-16 LAB — POCT INR: INR: 2.3 (ref 2.0–3.0)

## 2023-11-16 NOTE — Patient Instructions (Signed)
 continue taking warfarin 1 tablet daily except 1/2 tablet on Tuesday.  Remain consistent with green leafy vegetable (2 times per week-Tuesday and Thursday).  Recheck INR in 4 weeks. Call us  with any medication changes or bleeding concerns [phone]

## 2023-11-16 NOTE — Progress Notes (Signed)
 INR 2.3 Please see anticoagulation encounter  continue taking warfarin 1 tablet daily except 1/2 tablet on Tuesday.  Remain consistent with green leafy vegetable (2 times per week-Tuesday and Thursday).  Recheck INR in 4 weeks. Call us  with any medication changes or bleeding concerns [phone]

## 2023-12-11 ENCOUNTER — Ambulatory Visit

## 2023-12-12 ENCOUNTER — Ambulatory Visit: Attending: Cardiovascular Disease | Admitting: Pharmacist

## 2023-12-12 DIAGNOSIS — Z5181 Encounter for therapeutic drug level monitoring: Secondary | ICD-10-CM | POA: Insufficient documentation

## 2023-12-12 DIAGNOSIS — I4891 Unspecified atrial fibrillation: Secondary | ICD-10-CM | POA: Diagnosis present

## 2023-12-12 LAB — POCT INR: INR: 2.8 (ref 2.0–3.0)

## 2023-12-12 NOTE — Progress Notes (Signed)
 Description   INR 2.8: Continue taking warfarin 1 tablet daily except 1/2 tablet on Tuesday.  Remain consistent with green leafy vegetable (2 times per week-Tuesday and Thursday).  Recheck INR in 5 weeks. Call us  with any medication changes or bleeding concerns [phone]

## 2023-12-12 NOTE — Patient Instructions (Signed)
 Description   INR 2.8: Continue taking warfarin 1 tablet daily except 1/2 tablet on Tuesday.  Remain consistent with green leafy vegetable (2 times per week-Tuesday and Thursday).  Recheck INR in 5 weeks. Call us  with any medication changes or bleeding concerns [phone]

## 2024-01-23 ENCOUNTER — Ambulatory Visit: Attending: Cardiovascular Disease | Admitting: *Deleted

## 2024-01-23 DIAGNOSIS — I4891 Unspecified atrial fibrillation: Secondary | ICD-10-CM | POA: Insufficient documentation

## 2024-01-23 DIAGNOSIS — Z5181 Encounter for therapeutic drug level monitoring: Secondary | ICD-10-CM | POA: Insufficient documentation

## 2024-01-23 LAB — POCT INR: INR: 2.7 (ref 2.0–3.0)

## 2024-01-23 NOTE — Patient Instructions (Signed)
 Description   INR 2.7; Continue taking warfarin 1 tablet daily except 1/2 tablet on Tuesday.  Remain consistent with green leafy vegetable (2 times per week-Tuesday and Thursday).  Recheck INR in 6 weeks. Call us  with any medication changes or bleeding concerns [phone]

## 2024-01-23 NOTE — Progress Notes (Signed)
 Description   INR 2.7; Continue taking warfarin 1 tablet daily except 1/2 tablet on Tuesday.  Remain consistent with green leafy vegetable (2 times per week-Tuesday and Thursday).  Recheck INR in 6 weeks. Call us  with any medication changes or bleeding concerns [phone]

## 2024-03-04 ENCOUNTER — Ambulatory Visit
